# Patient Record
Sex: Male | Born: 1945
Health system: Southern US, Community
[De-identification: ages and names within clinical notes are randomized; demographics above are authoritative.]

## PROBLEM LIST (undated history)

## (undated) DIAGNOSIS — Z8051 Family history of malignant neoplasm of kidney: Secondary | ICD-10-CM

## (undated) DIAGNOSIS — M1990 Unspecified osteoarthritis, unspecified site: Secondary | ICD-10-CM

## (undated) DIAGNOSIS — N3281 Overactive bladder: Secondary | ICD-10-CM

## (undated) DIAGNOSIS — G629 Polyneuropathy, unspecified: Secondary | ICD-10-CM

## (undated) DIAGNOSIS — I2510 Atherosclerotic heart disease of native coronary artery without angina pectoris: Secondary | ICD-10-CM

## (undated) DIAGNOSIS — N183 Chronic kidney disease, stage 3 unspecified: Secondary | ICD-10-CM

## (undated) DIAGNOSIS — Z8709 Personal history of other diseases of the respiratory system: Secondary | ICD-10-CM

## (undated) DIAGNOSIS — Z8781 Personal history of (healed) traumatic fracture: Secondary | ICD-10-CM

## (undated) DIAGNOSIS — Z8719 Personal history of other diseases of the digestive system: Secondary | ICD-10-CM

## (undated) DIAGNOSIS — I440 Atrioventricular block, first degree: Secondary | ICD-10-CM

## (undated) DIAGNOSIS — I10 Essential (primary) hypertension: Secondary | ICD-10-CM

## (undated) DIAGNOSIS — Z86010 Personal history of colonic polyps: Secondary | ICD-10-CM

## (undated) DIAGNOSIS — N99820 Postprocedural hemorrhage and hematoma of a genitourinary system organ or structure following a genitourinary system procedure: Secondary | ICD-10-CM

## (undated) DIAGNOSIS — S42293A Other displaced fracture of upper end of unspecified humerus, initial encounter for closed fracture: Secondary | ICD-10-CM

## (undated) DIAGNOSIS — Z923 Personal history of irradiation: Secondary | ICD-10-CM

## (undated) DIAGNOSIS — G2581 Restless legs syndrome: Secondary | ICD-10-CM

## (undated) DIAGNOSIS — R350 Frequency of micturition: Secondary | ICD-10-CM

## (undated) DIAGNOSIS — Z87820 Personal history of traumatic brain injury: Secondary | ICD-10-CM

## (undated) DIAGNOSIS — Z87442 Personal history of urinary calculi: Secondary | ICD-10-CM

## (undated) DIAGNOSIS — K219 Gastro-esophageal reflux disease without esophagitis: Secondary | ICD-10-CM

## (undated) DIAGNOSIS — J439 Emphysema, unspecified: Secondary | ICD-10-CM

## (undated) DIAGNOSIS — N3941 Urge incontinence: Secondary | ICD-10-CM

## (undated) DIAGNOSIS — C61 Malignant neoplasm of prostate: Secondary | ICD-10-CM

## (undated) DIAGNOSIS — N529 Male erectile dysfunction, unspecified: Secondary | ICD-10-CM

## (undated) DIAGNOSIS — R011 Cardiac murmur, unspecified: Secondary | ICD-10-CM

## (undated) DIAGNOSIS — M109 Gout, unspecified: Secondary | ICD-10-CM

## (undated) DIAGNOSIS — G4733 Obstructive sleep apnea (adult) (pediatric): Secondary | ICD-10-CM

## (undated) DIAGNOSIS — Z860101 Personal history of adenomatous and serrated colon polyps: Secondary | ICD-10-CM

## (undated) DIAGNOSIS — I700 Atherosclerosis of aorta: Secondary | ICD-10-CM

## (undated) HISTORY — PX: CARDIOVASCULAR STRESS TEST: SHX262

## (undated) HISTORY — DX: Family history of malignant neoplasm of kidney: Z80.51

## (undated) HISTORY — PX: UMBILICAL HERNIA REPAIR: SHX196

## (undated) HISTORY — PX: EXTRACORPOREAL SHOCK WAVE LITHOTRIPSY: SHX1557

## (undated) HISTORY — DX: Malignant neoplasm of prostate: C61

## (undated) HISTORY — DX: Atherosclerotic heart disease of native coronary artery without angina pectoris: I25.10

## (undated) HISTORY — DX: Essential (primary) hypertension: I10

## (undated) HISTORY — PX: ABDOMINAL AORTIC ENDOVASCULAR STENT GRAFT: SHX5707

## (undated) HISTORY — PX: TRANSTHORACIC ECHOCARDIOGRAM: SHX275

## (undated) HISTORY — DX: Atherosclerosis of aorta: I70.0

## (undated) HISTORY — PX: CARDIAC CATHETERIZATION: SHX172

## (undated) HISTORY — PX: COLONOSCOPY W/ POLYPECTOMY: SHX1380

## (undated) HISTORY — PX: FRACTURE SURGERY: SHX138

---

## 1987-04-04 HISTORY — PX: CHEST TUBE INSERTION: SHX231

## 2003-04-04 DIAGNOSIS — Z87820 Personal history of traumatic brain injury: Secondary | ICD-10-CM

## 2003-04-04 HISTORY — DX: Personal history of traumatic brain injury: Z87.820

## 2003-04-04 HISTORY — PX: CARDIAC CATHETERIZATION: SHX172

## 2005-07-11 ENCOUNTER — Ambulatory Visit: Payer: Self-pay | Admitting: Family Medicine

## 2005-07-25 ENCOUNTER — Ambulatory Visit: Payer: Self-pay | Admitting: Family Medicine

## 2005-07-31 ENCOUNTER — Ambulatory Visit: Payer: Self-pay | Admitting: Family Medicine

## 2005-08-22 ENCOUNTER — Ambulatory Visit: Payer: Self-pay | Admitting: Family Medicine

## 2005-09-05 ENCOUNTER — Ambulatory Visit: Payer: Self-pay | Admitting: Family Medicine

## 2005-10-17 ENCOUNTER — Encounter (INDEPENDENT_AMBULATORY_CARE_PROVIDER_SITE_OTHER): Payer: Self-pay | Admitting: Family Medicine

## 2005-10-19 ENCOUNTER — Ambulatory Visit: Payer: Self-pay | Admitting: Family Medicine

## 2005-10-30 ENCOUNTER — Encounter (INDEPENDENT_AMBULATORY_CARE_PROVIDER_SITE_OTHER): Payer: Self-pay | Admitting: Family Medicine

## 2005-11-03 ENCOUNTER — Ambulatory Visit: Payer: Self-pay | Admitting: Family Medicine

## 2005-11-05 ENCOUNTER — Emergency Department (HOSPITAL_COMMUNITY): Admission: EM | Admit: 2005-11-05 | Discharge: 2005-11-05 | Payer: Self-pay | Admitting: Emergency Medicine

## 2005-11-10 ENCOUNTER — Ambulatory Visit: Payer: Self-pay | Admitting: Family Medicine

## 2006-01-08 ENCOUNTER — Encounter (INDEPENDENT_AMBULATORY_CARE_PROVIDER_SITE_OTHER): Payer: Self-pay | Admitting: Family Medicine

## 2006-06-06 ENCOUNTER — Encounter (INDEPENDENT_AMBULATORY_CARE_PROVIDER_SITE_OTHER): Payer: Self-pay | Admitting: Family Medicine

## 2006-09-07 LAB — CONVERTED CEMR LAB: PSA: 1.6 ng/mL

## 2007-01-31 ENCOUNTER — Ambulatory Visit: Payer: Self-pay | Admitting: Family Medicine

## 2007-01-31 DIAGNOSIS — M549 Dorsalgia, unspecified: Secondary | ICD-10-CM | POA: Insufficient documentation

## 2007-01-31 DIAGNOSIS — F17200 Nicotine dependence, unspecified, uncomplicated: Secondary | ICD-10-CM | POA: Insufficient documentation

## 2007-01-31 DIAGNOSIS — F411 Generalized anxiety disorder: Secondary | ICD-10-CM | POA: Insufficient documentation

## 2007-01-31 DIAGNOSIS — F4310 Post-traumatic stress disorder, unspecified: Secondary | ICD-10-CM | POA: Insufficient documentation

## 2007-01-31 DIAGNOSIS — M109 Gout, unspecified: Secondary | ICD-10-CM | POA: Insufficient documentation

## 2007-01-31 DIAGNOSIS — I10 Essential (primary) hypertension: Secondary | ICD-10-CM | POA: Insufficient documentation

## 2007-01-31 DIAGNOSIS — Z87442 Personal history of urinary calculi: Secondary | ICD-10-CM | POA: Insufficient documentation

## 2007-01-31 DIAGNOSIS — K279 Peptic ulcer, site unspecified, unspecified as acute or chronic, without hemorrhage or perforation: Secondary | ICD-10-CM | POA: Insufficient documentation

## 2007-01-31 DIAGNOSIS — E669 Obesity, unspecified: Secondary | ICD-10-CM | POA: Insufficient documentation

## 2007-01-31 DIAGNOSIS — E785 Hyperlipidemia, unspecified: Secondary | ICD-10-CM | POA: Insufficient documentation

## 2007-01-31 LAB — CONVERTED CEMR LAB
Cholesterol, target level: 200 mg/dL
HDL goal, serum: 40 mg/dL
LDL Goal: 100 mg/dL

## 2007-02-12 ENCOUNTER — Encounter (INDEPENDENT_AMBULATORY_CARE_PROVIDER_SITE_OTHER): Payer: Self-pay | Admitting: Internal Medicine

## 2007-02-21 ENCOUNTER — Ambulatory Visit: Payer: Self-pay | Admitting: Family Medicine

## 2007-02-21 DIAGNOSIS — M722 Plantar fascial fibromatosis: Secondary | ICD-10-CM | POA: Insufficient documentation

## 2007-02-22 ENCOUNTER — Encounter (INDEPENDENT_AMBULATORY_CARE_PROVIDER_SITE_OTHER): Payer: Self-pay | Admitting: Family Medicine

## 2007-03-22 ENCOUNTER — Telehealth (INDEPENDENT_AMBULATORY_CARE_PROVIDER_SITE_OTHER): Payer: Self-pay | Admitting: *Deleted

## 2007-03-25 ENCOUNTER — Encounter (INDEPENDENT_AMBULATORY_CARE_PROVIDER_SITE_OTHER): Payer: Self-pay | Admitting: Family Medicine

## 2007-03-26 ENCOUNTER — Encounter (INDEPENDENT_AMBULATORY_CARE_PROVIDER_SITE_OTHER): Payer: Self-pay | Admitting: Family Medicine

## 2007-05-13 ENCOUNTER — Encounter (INDEPENDENT_AMBULATORY_CARE_PROVIDER_SITE_OTHER): Payer: Self-pay | Admitting: Family Medicine

## 2007-07-11 ENCOUNTER — Telehealth (INDEPENDENT_AMBULATORY_CARE_PROVIDER_SITE_OTHER): Payer: Self-pay | Admitting: Family Medicine

## 2007-07-16 ENCOUNTER — Ambulatory Visit: Payer: Self-pay | Admitting: Family Medicine

## 2007-07-17 ENCOUNTER — Telehealth (INDEPENDENT_AMBULATORY_CARE_PROVIDER_SITE_OTHER): Payer: Self-pay | Admitting: *Deleted

## 2007-07-17 ENCOUNTER — Encounter (INDEPENDENT_AMBULATORY_CARE_PROVIDER_SITE_OTHER): Payer: Self-pay | Admitting: Family Medicine

## 2007-07-18 ENCOUNTER — Telehealth (INDEPENDENT_AMBULATORY_CARE_PROVIDER_SITE_OTHER): Payer: Self-pay | Admitting: *Deleted

## 2007-07-18 LAB — CONVERTED CEMR LAB
ALT: 15 units/L (ref 0–53)
AST: 14 units/L (ref 0–37)
Albumin: 4.3 g/dL (ref 3.5–5.2)
Alkaline Phosphatase: 79 units/L (ref 39–117)
BUN: 14 mg/dL (ref 6–23)
Basophils Absolute: 0.1 10*3/uL (ref 0.0–0.1)
Basophils Relative: 1 % (ref 0–1)
CO2: 26 meq/L (ref 19–32)
Calcium: 9.1 mg/dL (ref 8.4–10.5)
Chloride: 102 meq/L (ref 96–112)
Cholesterol: 167 mg/dL (ref 0–200)
Creatinine, Ser: 1.25 mg/dL (ref 0.40–1.50)
Eosinophils Absolute: 0.4 10*3/uL (ref 0.0–0.7)
Eosinophils Relative: 6 % — ABNORMAL HIGH (ref 0–5)
Glucose, Bld: 92 mg/dL (ref 70–99)
HCT: 45.9 % (ref 39.0–52.0)
HDL: 38 mg/dL — ABNORMAL LOW (ref >39)
Hemoglobin: 15.2 g/dL (ref 13.0–17.0)
LDL Cholesterol: 90 mg/dL (ref 0–99)
Lymphocytes Relative: 19 % (ref 12–46)
Lymphs Abs: 1.2 10*3/uL (ref 0.7–4.0)
MCHC: 33.1 g/dL (ref 30.0–36.0)
MCV: 88.3 fL (ref 78.0–100.0)
Monocytes Absolute: 0.4 10*3/uL (ref 0.1–1.0)
Monocytes Relative: 6 % (ref 3–12)
Neutro Abs: 4.4 10*3/uL (ref 1.7–7.7)
Neutrophils Relative %: 69 % (ref 43–77)
Platelets: 279 10*3/uL (ref 150–400)
Potassium: 3.9 meq/L (ref 3.5–5.3)
RBC: 5.2 M/uL (ref 4.22–5.81)
RDW: 14.1 % (ref 11.5–15.5)
Sodium: 144 meq/L (ref 135–145)
TSH: 1.608 microintl units/mL (ref 0.350–5.50)
Total Bilirubin: 0.5 mg/dL (ref 0.3–1.2)
Total CHOL/HDL Ratio: 4.4
Total Protein: 7.1 g/dL (ref 6.0–8.3)
Triglycerides: 196 mg/dL — ABNORMAL HIGH (ref <150)
Uric Acid, Serum: 7.2 mg/dL (ref 4.0–7.8)
VLDL: 39 mg/dL (ref 0–40)
WBC: 6.4 10*3/uL (ref 4.0–10.5)

## 2007-07-22 ENCOUNTER — Ambulatory Visit (HOSPITAL_COMMUNITY): Admission: RE | Admit: 2007-07-22 | Discharge: 2007-07-22 | Payer: Self-pay | Admitting: Family Medicine

## 2007-07-23 ENCOUNTER — Telehealth (INDEPENDENT_AMBULATORY_CARE_PROVIDER_SITE_OTHER): Payer: Self-pay | Admitting: *Deleted

## 2007-07-24 ENCOUNTER — Telehealth (INDEPENDENT_AMBULATORY_CARE_PROVIDER_SITE_OTHER): Payer: Self-pay | Admitting: *Deleted

## 2007-07-24 ENCOUNTER — Telehealth (INDEPENDENT_AMBULATORY_CARE_PROVIDER_SITE_OTHER): Payer: Self-pay | Admitting: Family Medicine

## 2007-07-30 ENCOUNTER — Ambulatory Visit: Payer: Self-pay | Admitting: Family Medicine

## 2007-07-30 DIAGNOSIS — R809 Proteinuria, unspecified: Secondary | ICD-10-CM | POA: Insufficient documentation

## 2007-07-30 LAB — CONVERTED CEMR LAB
Bilirubin Urine: NEGATIVE
Glucose, Urine, Semiquant: NEGATIVE
Ketones, urine, test strip: NEGATIVE
Nitrite: NEGATIVE
Protein, U semiquant: >=300
Specific Gravity, Urine: 1.025
Urobilinogen, UA: 0.2
WBC Urine, dipstick: NEGATIVE
pH: 6

## 2007-08-08 ENCOUNTER — Encounter (INDEPENDENT_AMBULATORY_CARE_PROVIDER_SITE_OTHER): Payer: Self-pay | Admitting: Family Medicine

## 2007-08-13 ENCOUNTER — Ambulatory Visit: Payer: Self-pay | Admitting: Family Medicine

## 2007-08-13 ENCOUNTER — Telehealth (INDEPENDENT_AMBULATORY_CARE_PROVIDER_SITE_OTHER): Payer: Self-pay | Admitting: *Deleted

## 2007-08-14 ENCOUNTER — Encounter (INDEPENDENT_AMBULATORY_CARE_PROVIDER_SITE_OTHER): Payer: Self-pay | Admitting: Family Medicine

## 2007-08-15 ENCOUNTER — Telehealth (INDEPENDENT_AMBULATORY_CARE_PROVIDER_SITE_OTHER): Payer: Self-pay | Admitting: *Deleted

## 2007-08-15 ENCOUNTER — Ambulatory Visit: Payer: Self-pay | Admitting: *Deleted

## 2007-08-15 LAB — CONVERTED CEMR LAB
Catecholamines Tot(E+NE) 24 Hr U: 0.046 mg/24hr
Collection Interval-CRCL: 24 hr
Creatinine 24 HR UR: 1759 mg/24hr (ref 800–2000)
Creatinine Clearance: 98 mL/min (ref 75–125)
Creatinine, Urine: 117.3 mg/dL
Dopamine 24 Hr Urine: 208 mcg/24hr (ref <500)
Epinephrine 24 Hr Urine: 2 mcg/24hr (ref <20)
Metaneph Total, Ur: 426 ug/24hr (ref 224–832)
Metanephrines, Ur: 80 — ABNORMAL LOW (ref 90–315)
Norepinephrine 24 Hr Urine: 45 mcg/24hr (ref <80)
Normetanephrine, 24H Ur: 346 (ref 122–676)
Protein, Ur: 900 mg/24hr — ABNORMAL HIGH (ref 50–100)
VMA, 24H Ur Adult: 5.8 mg/24hr (ref 1.8–6.7)
Volume, Urine-CORTUR: 1500 mL

## 2007-08-21 ENCOUNTER — Ambulatory Visit (HOSPITAL_COMMUNITY): Payer: Self-pay | Admitting: Psychology

## 2007-09-10 ENCOUNTER — Ambulatory Visit: Payer: Self-pay | Admitting: Family Medicine

## 2007-09-13 ENCOUNTER — Ambulatory Visit: Payer: Self-pay | Admitting: *Deleted

## 2007-09-13 ENCOUNTER — Inpatient Hospital Stay (HOSPITAL_COMMUNITY): Admission: RE | Admit: 2007-09-13 | Discharge: 2007-09-14 | Payer: Self-pay | Admitting: *Deleted

## 2007-09-23 ENCOUNTER — Ambulatory Visit: Payer: Self-pay | Admitting: *Deleted

## 2007-10-10 ENCOUNTER — Ambulatory Visit: Payer: Self-pay | Admitting: *Deleted

## 2007-10-10 ENCOUNTER — Encounter: Admission: RE | Admit: 2007-10-10 | Discharge: 2007-10-10 | Payer: Self-pay | Admitting: *Deleted

## 2007-10-22 ENCOUNTER — Ambulatory Visit: Payer: Self-pay | Admitting: Family Medicine

## 2007-10-29 ENCOUNTER — Ambulatory Visit: Payer: Self-pay | Admitting: Family Medicine

## 2007-10-30 ENCOUNTER — Encounter (INDEPENDENT_AMBULATORY_CARE_PROVIDER_SITE_OTHER): Payer: Self-pay | Admitting: Family Medicine

## 2007-10-30 LAB — CONVERTED CEMR LAB
Folate: 5 ng/mL
Vitamin B-12: 268 pg/mL (ref 211–911)

## 2007-12-02 ENCOUNTER — Ambulatory Visit: Payer: Self-pay | Admitting: Family Medicine

## 2007-12-02 LAB — CONVERTED CEMR LAB: LDL Goal: 130 mg/dL

## 2007-12-13 ENCOUNTER — Telehealth (INDEPENDENT_AMBULATORY_CARE_PROVIDER_SITE_OTHER): Payer: Self-pay | Admitting: *Deleted

## 2007-12-18 ENCOUNTER — Encounter (INDEPENDENT_AMBULATORY_CARE_PROVIDER_SITE_OTHER): Payer: Self-pay | Admitting: Family Medicine

## 2007-12-23 ENCOUNTER — Encounter (INDEPENDENT_AMBULATORY_CARE_PROVIDER_SITE_OTHER): Payer: Self-pay | Admitting: Family Medicine

## 2008-01-20 ENCOUNTER — Ambulatory Visit: Payer: Self-pay | Admitting: Family Medicine

## 2008-01-20 DIAGNOSIS — E538 Deficiency of other specified B group vitamins: Secondary | ICD-10-CM | POA: Insufficient documentation

## 2008-01-20 DIAGNOSIS — E559 Vitamin D deficiency, unspecified: Secondary | ICD-10-CM | POA: Insufficient documentation

## 2008-02-20 ENCOUNTER — Ambulatory Visit: Payer: Self-pay | Admitting: Family Medicine

## 2008-02-20 DIAGNOSIS — N401 Enlarged prostate with lower urinary tract symptoms: Secondary | ICD-10-CM | POA: Insufficient documentation

## 2008-02-20 LAB — CONVERTED CEMR LAB
Bilirubin Urine: NEGATIVE
Blood in Urine, dipstick: NEGATIVE
Glucose, Urine, Semiquant: NEGATIVE
Ketones, urine, test strip: NEGATIVE
Nitrite: NEGATIVE
Protein, U semiquant: >=300
Specific Gravity, Urine: 1.025
Urobilinogen, UA: 0.2
WBC Urine, dipstick: NEGATIVE
pH: 6

## 2008-02-24 ENCOUNTER — Encounter (INDEPENDENT_AMBULATORY_CARE_PROVIDER_SITE_OTHER): Payer: Self-pay | Admitting: Family Medicine

## 2008-02-25 LAB — CONVERTED CEMR LAB
ALT: 16 units/L (ref 0–53)
AST: 13 units/L (ref 0–37)
Albumin: 4.3 g/dL (ref 3.5–5.2)
Alkaline Phosphatase: 68 units/L (ref 39–117)
BUN: 19 mg/dL (ref 6–23)
CO2: 21 meq/L (ref 19–32)
Calcium: 8.5 mg/dL (ref 8.4–10.5)
Chloride: 106 meq/L (ref 96–112)
Cholesterol: 176 mg/dL (ref 0–200)
Creatinine, Ser: 1.43 mg/dL (ref 0.40–1.50)
Glucose, Bld: 108 mg/dL — ABNORMAL HIGH (ref 70–99)
HDL: 40 mg/dL (ref >39)
LDL Cholesterol: 108 mg/dL — ABNORMAL HIGH (ref 0–99)
PSA: 2.16 ng/mL (ref 0.10–4.00)
Potassium: 3.6 meq/L (ref 3.5–5.3)
Sodium: 143 meq/L (ref 135–145)
Total Bilirubin: 0.7 mg/dL (ref 0.3–1.2)
Total CHOL/HDL Ratio: 4.4
Total Protein: 6.5 g/dL (ref 6.0–8.3)
Triglycerides: 138 mg/dL (ref <150)
VLDL: 28 mg/dL (ref 0–40)

## 2008-03-19 ENCOUNTER — Ambulatory Visit: Payer: Self-pay | Admitting: Family Medicine

## 2008-03-19 LAB — CONVERTED CEMR LAB: LDL Goal: 100 mg/dL

## 2008-03-23 ENCOUNTER — Encounter (INDEPENDENT_AMBULATORY_CARE_PROVIDER_SITE_OTHER): Payer: Self-pay | Admitting: Family Medicine

## 2008-04-27 ENCOUNTER — Ambulatory Visit: Payer: Self-pay | Admitting: Family Medicine

## 2008-04-29 ENCOUNTER — Encounter (INDEPENDENT_AMBULATORY_CARE_PROVIDER_SITE_OTHER): Payer: Self-pay | Admitting: Family Medicine

## 2008-04-30 ENCOUNTER — Encounter: Admission: RE | Admit: 2008-04-30 | Discharge: 2008-04-30 | Payer: Self-pay | Admitting: *Deleted

## 2008-04-30 ENCOUNTER — Ambulatory Visit: Payer: Self-pay | Admitting: *Deleted

## 2008-06-08 ENCOUNTER — Ambulatory Visit: Payer: Self-pay | Admitting: Family Medicine

## 2008-06-24 ENCOUNTER — Encounter (INDEPENDENT_AMBULATORY_CARE_PROVIDER_SITE_OTHER): Payer: Self-pay | Admitting: Family Medicine

## 2008-06-26 LAB — CONVERTED CEMR LAB
ALT: 13 units/L (ref 0–53)
AST: 13 units/L (ref 0–37)
Albumin: 4.3 g/dL (ref 3.5–5.2)
Alkaline Phosphatase: 70 units/L (ref 39–117)
BUN: 17 mg/dL (ref 6–23)
Basophils Absolute: 0.1 10*3/uL (ref 0.0–0.1)
Basophils Relative: 1 % (ref 0–1)
CO2: 23 meq/L (ref 19–32)
Calcium: 8.8 mg/dL (ref 8.4–10.5)
Chloride: 109 meq/L (ref 96–112)
Cholesterol: 137 mg/dL (ref 0–200)
Creatinine, Ser: 1.35 mg/dL (ref 0.40–1.50)
Eosinophils Absolute: 0.3 10*3/uL (ref 0.0–0.7)
Eosinophils Relative: 5 % (ref 0–5)
Glucose, Bld: 102 mg/dL — ABNORMAL HIGH (ref 70–99)
HCT: 43.3 % (ref 39.0–52.0)
HDL: 38 mg/dL — ABNORMAL LOW (ref >39)
Hemoglobin: 14 g/dL (ref 13.0–17.0)
LDL Cholesterol: 73 mg/dL (ref 0–99)
Lymphocytes Relative: 18 % (ref 12–46)
Lymphs Abs: 1.1 10*3/uL (ref 0.7–4.0)
MCHC: 32.3 g/dL (ref 30.0–36.0)
MCV: 85.7 fL (ref 78.0–100.0)
Monocytes Absolute: 0.4 10*3/uL (ref 0.1–1.0)
Monocytes Relative: 7 % (ref 3–12)
Neutro Abs: 4.2 10*3/uL (ref 1.7–7.7)
Neutrophils Relative %: 69 % (ref 43–77)
Platelets: 232 10*3/uL (ref 150–400)
Potassium: 3.7 meq/L (ref 3.5–5.3)
RBC: 5.05 M/uL (ref 4.22–5.81)
RDW: 14.5 % (ref 11.5–15.5)
Sodium: 145 meq/L (ref 135–145)
TSH: 2.007 microintl units/mL (ref 0.350–4.500)
Total Bilirubin: 0.6 mg/dL (ref 0.3–1.2)
Total CHOL/HDL Ratio: 3.6
Total Protein: 6.8 g/dL (ref 6.0–8.3)
Triglycerides: 128 mg/dL (ref <150)
VLDL: 26 mg/dL (ref 0–40)
Vit D, 25-Hydroxy: 42 ng/mL (ref 30–89)
Vitamin B-12: 342 pg/mL (ref 211–911)
WBC: 6.1 10*3/uL (ref 4.0–10.5)

## 2008-07-01 ENCOUNTER — Ambulatory Visit: Payer: Self-pay | Admitting: Family Medicine

## 2008-07-02 ENCOUNTER — Encounter (INDEPENDENT_AMBULATORY_CARE_PROVIDER_SITE_OTHER): Payer: Self-pay | Admitting: Family Medicine

## 2008-07-02 ENCOUNTER — Encounter (INDEPENDENT_AMBULATORY_CARE_PROVIDER_SITE_OTHER): Payer: Self-pay | Admitting: *Deleted

## 2008-07-30 ENCOUNTER — Ambulatory Visit: Payer: Self-pay | Admitting: Internal Medicine

## 2008-07-30 DIAGNOSIS — Z86010 Personal history of colonic polyps: Secondary | ICD-10-CM | POA: Insufficient documentation

## 2008-07-30 DIAGNOSIS — K219 Gastro-esophageal reflux disease without esophagitis: Secondary | ICD-10-CM | POA: Insufficient documentation

## 2008-08-06 ENCOUNTER — Ambulatory Visit: Payer: Self-pay | Admitting: Family Medicine

## 2008-08-06 DIAGNOSIS — R0609 Other forms of dyspnea: Secondary | ICD-10-CM | POA: Insufficient documentation

## 2008-08-06 DIAGNOSIS — L919 Hypertrophic disorder of the skin, unspecified: Secondary | ICD-10-CM

## 2008-08-06 DIAGNOSIS — L909 Atrophic disorder of skin, unspecified: Secondary | ICD-10-CM | POA: Insufficient documentation

## 2008-08-06 DIAGNOSIS — R0989 Other specified symptoms and signs involving the circulatory and respiratory systems: Secondary | ICD-10-CM | POA: Insufficient documentation

## 2008-08-07 ENCOUNTER — Encounter (INDEPENDENT_AMBULATORY_CARE_PROVIDER_SITE_OTHER): Payer: Self-pay | Admitting: Family Medicine

## 2008-08-12 ENCOUNTER — Encounter: Payer: Self-pay | Admitting: Internal Medicine

## 2008-08-12 ENCOUNTER — Ambulatory Visit: Payer: Self-pay | Admitting: Internal Medicine

## 2008-08-12 ENCOUNTER — Ambulatory Visit (HOSPITAL_COMMUNITY): Admission: RE | Admit: 2008-08-12 | Discharge: 2008-08-12 | Payer: Self-pay | Admitting: Internal Medicine

## 2008-08-16 ENCOUNTER — Encounter: Payer: Self-pay | Admitting: Internal Medicine

## 2008-08-16 ENCOUNTER — Encounter (INDEPENDENT_AMBULATORY_CARE_PROVIDER_SITE_OTHER): Payer: Self-pay | Admitting: Family Medicine

## 2008-08-19 ENCOUNTER — Encounter (INDEPENDENT_AMBULATORY_CARE_PROVIDER_SITE_OTHER): Payer: Self-pay | Admitting: Family Medicine

## 2008-08-21 ENCOUNTER — Encounter (INDEPENDENT_AMBULATORY_CARE_PROVIDER_SITE_OTHER): Payer: Self-pay | Admitting: Family Medicine

## 2008-08-25 ENCOUNTER — Telehealth (INDEPENDENT_AMBULATORY_CARE_PROVIDER_SITE_OTHER): Payer: Self-pay | Admitting: *Deleted

## 2008-09-09 ENCOUNTER — Encounter (INDEPENDENT_AMBULATORY_CARE_PROVIDER_SITE_OTHER): Payer: Self-pay | Admitting: Family Medicine

## 2008-09-28 ENCOUNTER — Ambulatory Visit: Payer: Self-pay | Admitting: Family Medicine

## 2008-10-13 ENCOUNTER — Encounter (INDEPENDENT_AMBULATORY_CARE_PROVIDER_SITE_OTHER): Payer: Self-pay | Admitting: Family Medicine

## 2008-10-14 ENCOUNTER — Encounter (INDEPENDENT_AMBULATORY_CARE_PROVIDER_SITE_OTHER): Payer: Self-pay | Admitting: Family Medicine

## 2008-10-29 ENCOUNTER — Ambulatory Visit: Payer: Self-pay | Admitting: *Deleted

## 2008-12-30 ENCOUNTER — Emergency Department (HOSPITAL_COMMUNITY): Admission: EM | Admit: 2008-12-30 | Discharge: 2008-12-30 | Payer: Self-pay | Admitting: Emergency Medicine

## 2009-06-02 ENCOUNTER — Encounter: Payer: Self-pay | Admitting: Cardiology

## 2009-06-04 ENCOUNTER — Ambulatory Visit (HOSPITAL_COMMUNITY): Admission: RE | Admit: 2009-06-04 | Discharge: 2009-06-04 | Payer: Self-pay | Admitting: Family Medicine

## 2009-07-27 ENCOUNTER — Encounter: Payer: Self-pay | Admitting: Cardiology

## 2009-09-15 ENCOUNTER — Ambulatory Visit: Payer: Self-pay | Admitting: Cardiology

## 2009-09-17 LAB — CONVERTED CEMR LAB: Pro B Natriuretic peptide (BNP): 34.4 pg/mL (ref 0.0–100.0)

## 2009-09-23 ENCOUNTER — Telehealth (INDEPENDENT_AMBULATORY_CARE_PROVIDER_SITE_OTHER): Payer: Self-pay | Admitting: *Deleted

## 2009-09-27 ENCOUNTER — Encounter (HOSPITAL_COMMUNITY): Admission: RE | Admit: 2009-09-27 | Discharge: 2009-12-08 | Payer: Self-pay | Admitting: Cardiology

## 2009-09-27 ENCOUNTER — Encounter: Payer: Self-pay | Admitting: Cardiology

## 2009-09-27 ENCOUNTER — Ambulatory Visit: Payer: Self-pay

## 2009-09-27 ENCOUNTER — Ambulatory Visit: Payer: Self-pay | Admitting: Cardiology

## 2009-09-27 ENCOUNTER — Ambulatory Visit (HOSPITAL_COMMUNITY): Admission: RE | Admit: 2009-09-27 | Discharge: 2009-09-27 | Payer: Self-pay | Admitting: Cardiology

## 2009-09-28 ENCOUNTER — Encounter: Payer: Self-pay | Admitting: Cardiology

## 2009-09-30 ENCOUNTER — Observation Stay (HOSPITAL_COMMUNITY): Admission: EM | Admit: 2009-09-30 | Discharge: 2009-10-01 | Payer: Self-pay | Admitting: Emergency Medicine

## 2009-09-30 ENCOUNTER — Ambulatory Visit: Payer: Self-pay | Admitting: Internal Medicine

## 2009-10-05 ENCOUNTER — Ambulatory Visit (HOSPITAL_COMMUNITY): Admission: RE | Admit: 2009-10-05 | Discharge: 2009-10-07 | Payer: Self-pay | Admitting: Cardiology

## 2009-10-05 ENCOUNTER — Ambulatory Visit: Payer: Self-pay | Admitting: Cardiology

## 2009-10-06 ENCOUNTER — Encounter: Payer: Self-pay | Admitting: Cardiology

## 2009-10-13 ENCOUNTER — Ambulatory Visit: Payer: Self-pay | Admitting: Cardiology

## 2009-10-13 DIAGNOSIS — N182 Chronic kidney disease, stage 2 (mild): Secondary | ICD-10-CM | POA: Insufficient documentation

## 2009-10-14 ENCOUNTER — Telehealth (INDEPENDENT_AMBULATORY_CARE_PROVIDER_SITE_OTHER): Payer: Self-pay | Admitting: *Deleted

## 2009-10-14 LAB — CONVERTED CEMR LAB
BUN: 22 mg/dL (ref 6–23)
CO2: 28 meq/L (ref 19–32)
Calcium: 8.5 mg/dL (ref 8.4–10.5)
Chloride: 103 meq/L (ref 96–112)
Creatinine, Ser: 1.5 mg/dL (ref 0.4–1.5)
GFR calc non Af Amer: 48.48 mL/min (ref >60)
Glucose, Bld: 113 mg/dL — ABNORMAL HIGH (ref 70–99)
Potassium: 3.2 meq/L — ABNORMAL LOW (ref 3.5–5.1)
Sodium: 141 meq/L (ref 135–145)

## 2009-10-26 ENCOUNTER — Ambulatory Visit: Payer: Self-pay | Admitting: Cardiology

## 2009-10-26 DIAGNOSIS — R079 Chest pain, unspecified: Secondary | ICD-10-CM | POA: Insufficient documentation

## 2009-10-27 LAB — CONVERTED CEMR LAB
BUN: 22 mg/dL (ref 6–23)
CO2: 28 meq/L (ref 19–32)
Calcium: 8.7 mg/dL (ref 8.4–10.5)
Chloride: 103 meq/L (ref 96–112)
Creatinine, Ser: 1.5 mg/dL (ref 0.4–1.5)
GFR calc non Af Amer: 50.35 mL/min (ref >60)
Glucose, Bld: 78 mg/dL (ref 70–99)
Potassium: 3.1 meq/L — ABNORMAL LOW (ref 3.5–5.1)
Sodium: 140 meq/L (ref 135–145)

## 2009-11-10 ENCOUNTER — Telehealth: Payer: Self-pay | Admitting: Cardiology

## 2009-11-16 ENCOUNTER — Emergency Department (HOSPITAL_COMMUNITY): Admission: EM | Admit: 2009-11-16 | Discharge: 2009-11-16 | Payer: Self-pay | Admitting: Emergency Medicine

## 2009-11-18 ENCOUNTER — Ambulatory Visit: Payer: Self-pay

## 2009-11-18 ENCOUNTER — Ambulatory Visit: Payer: Self-pay | Admitting: Cardiology

## 2009-11-18 LAB — CONVERTED CEMR LAB
BUN: 28 mg/dL — ABNORMAL HIGH (ref 6–23)
CO2: 26 meq/L (ref 19–32)
Calcium: 8.6 mg/dL (ref 8.4–10.5)
Chloride: 107 meq/L (ref 96–112)
Creatinine, Ser: 1.7 mg/dL — ABNORMAL HIGH (ref 0.4–1.5)
GFR calc non Af Amer: 42.94 mL/min (ref >60)
Glucose, Bld: 96 mg/dL (ref 70–99)
Potassium: 3 meq/L — ABNORMAL LOW (ref 3.5–5.1)
Sodium: 143 meq/L (ref 135–145)

## 2009-11-25 ENCOUNTER — Ambulatory Visit: Payer: Self-pay | Admitting: Cardiology

## 2009-11-25 DIAGNOSIS — E876 Hypokalemia: Secondary | ICD-10-CM | POA: Insufficient documentation

## 2009-11-29 LAB — CONVERTED CEMR LAB
ALT: 25 units/L (ref 0–53)
AST: 20 units/L (ref 0–37)
Albumin: 3.9 g/dL (ref 3.5–5.2)
Alkaline Phosphatase: 54 units/L (ref 39–117)
Bilirubin, Direct: 0.1 mg/dL (ref 0.0–0.3)
Cholesterol: 187 mg/dL (ref 0–200)
Direct LDL: 132.2 mg/dL
HDL: 34.1 mg/dL — ABNORMAL LOW (ref >39.00)
Total Bilirubin: 0.5 mg/dL (ref 0.3–1.2)
Total CHOL/HDL Ratio: 5
Total Protein: 6.6 g/dL (ref 6.0–8.3)
Triglycerides: 202 mg/dL — ABNORMAL HIGH (ref 0.0–149.0)
VLDL: 40.4 mg/dL — ABNORMAL HIGH (ref 0.0–40.0)

## 2010-01-07 ENCOUNTER — Telehealth: Payer: Self-pay | Admitting: Cardiology

## 2010-01-11 ENCOUNTER — Ambulatory Visit: Payer: Self-pay

## 2010-01-24 ENCOUNTER — Ambulatory Visit (HOSPITAL_COMMUNITY): Admission: RE | Admit: 2010-01-24 | Discharge: 2010-01-24 | Payer: Self-pay | Admitting: Family Medicine

## 2010-02-17 ENCOUNTER — Encounter: Payer: Self-pay | Admitting: Cardiology

## 2010-02-21 ENCOUNTER — Ambulatory Visit: Payer: Self-pay | Admitting: Surgery

## 2010-02-28 ENCOUNTER — Ambulatory Visit: Payer: Self-pay | Admitting: Surgery

## 2010-02-28 ENCOUNTER — Encounter: Admission: RE | Admit: 2010-02-28 | Discharge: 2010-02-28 | Payer: Self-pay | Admitting: Surgery

## 2010-03-14 ENCOUNTER — Ambulatory Visit: Payer: Self-pay

## 2010-03-21 ENCOUNTER — Emergency Department (HOSPITAL_COMMUNITY)
Admission: EM | Admit: 2010-03-21 | Discharge: 2010-03-21 | Payer: Self-pay | Source: Home / Self Care | Admitting: Emergency Medicine

## 2010-03-23 ENCOUNTER — Encounter: Payer: Self-pay | Admitting: Cardiology

## 2010-03-23 ENCOUNTER — Ambulatory Visit: Payer: Self-pay | Admitting: Cardiology

## 2010-03-23 DIAGNOSIS — I2510 Atherosclerotic heart disease of native coronary artery without angina pectoris: Secondary | ICD-10-CM | POA: Insufficient documentation

## 2010-04-06 ENCOUNTER — Ambulatory Visit: Admit: 2010-04-06 | Payer: Self-pay | Admitting: Cardiology

## 2010-04-23 ENCOUNTER — Encounter: Payer: Self-pay | Admitting: Family Medicine

## 2010-04-24 ENCOUNTER — Encounter: Payer: Self-pay | Admitting: Vascular Surgery

## 2010-04-24 ENCOUNTER — Encounter: Payer: Self-pay | Admitting: Family Medicine

## 2010-04-25 ENCOUNTER — Ambulatory Visit: Admit: 2010-04-25 | Payer: Self-pay | Admitting: Cardiology

## 2010-05-03 NOTE — Progress Notes (Signed)
Summary: B/P readings  Phone Note Outgoing Call   Call placed by: Desiree Lucy, RN, BSN,  November 10, 2009 8:52 AM Call placed to: Patient Summary of Call: B/P readings  Follow-up for Phone Call        10/26/09--Coreg increased to 25mg  twice a day--call and get B/P readings--LMTCB Desiree Lucy, RN, BSN  November 10, 2009 11:07 AM ---[facility] Desiree Lucy, RN, BSN  November 12, 2009 3:24 PM  [place], RN, BSN  November 15, 2009 9:12 AM   [place], RN, BSN  November 15, 2009 5:50 PM --Andrews, RN, BSN  November 17, 2009 5:28 PM   Additional Follow-up for Phone Call Additional follow up Details #1::        Called patient concerning his bmet from 8/18 and he mentioned that his BP is doing better after medication change as above. He states his last BP reading was 141/94. He had a renal ultrasound today and has a follow up appointment with Dr.McLean on 8/25. He wanted to let Desiree Lucy know that he tried to call her back but did not get thru on the phone. Additional Follow-up by: Georgetta Haber RN

## 2010-05-03 NOTE — Letter (Signed)
Summary: Urology Referral  Urology Referral   Imported By: Rennie Plowman 03/23/2008 14:08:36  _____________________________________________________________________  External Attachment:    Type:   Image     Comment:   External Document

## 2010-05-03 NOTE — Progress Notes (Signed)
Summary: 07/17/07 lab reults  Phone Note Outgoing Call   Call placed by: Druscilla Brownie,  July 18, 2007 11:19 AM Summary of Call: results given to patient and Creat called to [facility] radiology  Initial call taken by: Druscilla Brownie,  July 18, 2007 11:20 AM

## 2010-05-03 NOTE — Assessment & Plan Note (Signed)
Summary: FOLLOW UP 1 MONTH/SLJ   Vital Signs:  Patient profile:   65 year old male Height:      68 inches Weight:      261 pounds BMI:     39.83 O2 Sat:      96 % Pulse rate:   87 / minute Resp:     16 per minute BP sitting:   153 / 104  Vitals Entered By: Deidre Ala LPN (May  6, 624THL D34-534 PM)  Nutrition Counseling: Patient's BMI is greater than 25 and therefore counseled on weight management options. CC: follow-up visit, Hypertension Management, Lipid Management   Primary Provider:  Jonna Munro  CC:  follow-up visit, Hypertension Management, and Lipid Management.  History of Present Illness: Pt in for recheck.  He saw [facility] and is set for colonoscopy next week. He has a hx of polyps and is not clear which kind. His last scope was years ago and due. He knows he was told to get repeat in 5 years. He has a good apetite. Denies nausea and vomitting. No constipation or diarrhea. No bloody stools. States Dr. Gala Romney will do scope. He is scared of scope as was awake last visit and felt a lot of pain.  He states he cont to have high BP here. He does home readinbgs and states 137/83. He uses elctronic device. Pressure at GI was 142/98. Hates coming to MD. He states he has not had chest pain, cont with baseline SOB. He denies orthopnea, PND and palpitations. Declines cardiac and pulmo rehab until done with scope. He has had stress test at [facility] dating back to 2004 - told at time had enlarged heart. No blockage though - had heart cath. He does smoke and  and has dx of COPD. Used Ssiriva and it helped a lot.  Chart review shows in-office spirometry with mild defect and FEV1 of 78. States would love to resume.   He does have leg pain and had ABIs after Aneurysmal repair in June 2009. He was foind to have Vit D def and B12 def per Neuro and levels have normalized. States legs still hurt and wife and he convinced either RLS or PAD. Reviewed ABI result and discussed symptoms of RLS.  He states does cramp in legs but burns in feet. Saw Dr. Brandon Melnick in [place] for eval of neruopathy. Calls him a "quack". See report in EMR.  States leg crampy at night and during day. Wants to try trial of Requip or Quinine. Will agree to see different neurologist for leg burning and cramping if persist despite Rx.  He would also like to see Dermatology - skin tags and dry spots on forehead. Denies bleeding adn enlragement but some ithcing. States wants referal after he completes GI work-up.  He now presents.    Hypertension History:      He complains of dyspnea with exertion, but denies headache, chest pain, palpitations, orthopnea, PND, peripheral edema, visual symptoms, neurologic problems, syncope, and side effects from treatment.  He notes no problems with any antihypertensive medication side effects.  Further comments include: See HPI.        Positive major cardiovascular risk factors include male age 77 years old or older, hyperlipidemia, hypertension, and current tobacco user.  Negative major cardiovascular risk factors include no history of diabetes and negative family history for ischemic heart disease.        Further assessment for target organ damage reveals no history of ASHD, stroke/TIA, or peripheral vascular  disease.    Lipid Management History:      Positive NCEP/ATP III risk factors include male age 63 years old or older, HDL cholesterol less than 40, current tobacco user, and hypertension.  Negative NCEP/ATP III risk factors include non-diabetic, no family history for ischemic heart disease, no ASHD (atherosclerotic heart disease), no prior stroke/TIA, no peripheral vascular disease, and no history of aortic aneurysm.        The patient states that he knows about the "Therapeutic Lifestyle Change" diet.  His compliance with the TLC diet is fair.  The patient expresses understanding of adjunctive measures for cholesterol lowering.  Adjunctive measures started by the patient include  aerobic exercise, fiber, and ASA.  He expresses no side effects from his lipid-lowering medication.  The patient denies any symptoms to suggest myopathy or liver disease.  Comments: Weight down since last eval and states trying to eat healthier and be busier.    Preventive Screening-Counseling & Management     Alcohol drinks/day: 0     Smoking Status: current     Smoking Cessation Counseling: yes     Smoke Cessation Stage: precontemplative     Packs/Day: 0.75     Year Started: Age  6  Comments: States dwon to 3 cigs per day now.  Current Problems (verified): 1)  Gerd  (ICD-530.81) 2)  Colonic Polyps, Hx of  (ICD-V12.72) 3)  Special Screening For Malignant Neoplasms Colon  (ICD-V76.51) 4)  Benign Prostatic Hypertrophy, With Obstruction  (ICD-600.01) 5)  Vitamin B12 Deficiency  (ICD-266.2) 6)  Vitamin D Deficiency  (ICD-268.9) 7)  Neuropathy - Sensory-motor  (ICD-355.9) 8)  Proteinuria  (ICD-791.0) 9)  Plantar Fasciitis, Bilateral  (ICD-728.71) 10)  Nephrolithiasis, Hx of  (ICD-V13.01) 11)  Back Pain  (ICD-724.5) 12)  Obesity Nos  (ICD-278.00) 13)  Sleep Apnea - 4cm H2o  (ICD-780.57) 14)  Tobacco Abuse  (ICD-305.1) 15)  Ptsd  (ICD-309.81) 16)  Pud  (ICD-533.90) 17)  Hypertension  (ICD-401.9) 18)  Hyperlipidemia  (ICD-272.4) 19)  Gout  (ICD-274.9) 20)  Anxiety  (ICD-300.00)  Current Medications (verified): 1)  Adult Aspirin Ec Low Strength 81 Mg  Tbec (Aspirin) .... One Daily 2)  Carvedilol 12.5 Mg Tabs (Carvedilol) .... Two Times A Day 3)  Lexapro 10 Mg  Tabs (Escitalopram Oxalate) .... One Daily 4)  Caduet 10-20 Mg Tabs (Amlodipine-Atorvastatin) .... One Daily 5)  Pantoprazole Sodium 40 Mg  Tbec (Pantoprazole Sodium) .... One Daily 6)  Diovan 320 Mg  Tabs (Valsartan) .... One Daily 7)  Catapres 0.1 Mg  Tabs (Clonidine Hcl) .... Two Times A Day 8)  Labetalol Hcl 200 Mg  Tabs (Labetalol Hcl) .... One Two Times A Day 9)  Vitamin D 50000 Unit Caps (Ergocalciferol) .... One  Weekly 10)  Flomax 0.4 Mg Xr24h-Cap (Tamsulosin Hcl) .... One Daily At Bedtime. 11)  Vit B12 Injections .... One Monthly 12)  Requip 0.5 Mg Tabs (Ropinirole Hcl) .... One At Bedtime For 5 Days and Then Two 13)  Spiriva Handihaler 18 Mcg Caps (Tiotropium Bromide Monohydrate) .... One Inh Daily 14)  Proventil Hfa 108 (90 Base) Mcg/act Aers (Albuterol Sulfate) .... Two Inh Every 6 Hours As Needed  Allergies (verified): 1)  ! Sulf-10 2)  ! Bactrim Ds (Sulfamethoxazole-Trimethoprim)  Past History:  Past Medical History:    Current Problems:     PROTEINURIA (ICD-791.0)    ? of ABDOMINAL AORTIC ANEURYSM (ICD-441.4)    ONYCHOMYCOSIS (ICD-110.1)    PLANTAR FASCIITIS, BILATERAL (ICD-728.71)  NEPHROLITHIASIS, HX OF (ICD-V13.01)    BACK PAIN (ICD-724.5)    OBESITY NOS (ICD-278.00)    SLEEP APNEA - 4CM H2O (ICD-780.57), patient does not tolerate CPAP    TOBACCO ABUSE (ICD-305.1)    PTSD (ICD-309.81)    PUD (ICD-533.90)    HYPERTENSION (ICD-401.9)    HYPERLIPIDEMIA (ICD-272.4)    GOUT (ICD-274.9)    ANXIETY (ICD-300.00)    BENIGN PROSTATIC HYPERTROPHY, WITH OBSTRUCTION (ICD-600.01)    VITAMIN B12 DEFICIENCY (ICD-266.2)    VITAMIN D DEFICIENCY (ICD-268.9)    NEUROPATHY - SENSORY-MOTOR (ICD-355.9)    H/O colonic polyps                     (07/30/2008)  Past Surgical History:    1. Broken arms    2. Hernia repair - umbilical    3. Collapsed lung - right - hx of pushing xray machine and had spontaneous pneuomothorax    4. Kidney stones    5.June 2009 - 1. Closure of penetrating atherosclerotic ulcer infrarenal aorta with          endovascular stent graft (Endologix Powerlink bifurcated stent          graft 25 x 16 x 120, proximal cuff 25 x 25 x 75).     6. 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 College education but never graduate.    Children - 2 sons, 1 dgt (07/30/2008)  Risk Factors:    Alcohol Use: 0 (07/01/2008)    >5 drinks/d w/in last 3 months: N/A    Caffeine Use: N/A    Diet: N/A    Exercise: N/A  Risk Factors:    Smoking Status: current (07/01/2008)    Packs/Day: 0.75 (07/01/2008)    Cigars/wk: N/A    Pipe Use/wk: N/A    Cans of tobacco/wk: N/A    Passive Smoke Exposure: N/A  Review of Systems      See HPI  Physical Exam  General:  Well-developed, obesity ,in no acute distress; alert,appropriate and cooperative throughout examination. Obese. Lungs:  Normal respiratory effort, chest expands symmetrically. Lungs are clear to auscultation, no crackles or wheezes. Heart:  Normal rate and regular rhythm. S1 and S2 normal without gallop, murmur, click, rub or other extra sounds. Abdomen:  Bowel sounds positive,abdomen soft and non-tender without masses, organomegaly or hernias noted. Extremities:  No clubbing, cyanosis, edema, or deformity noted with normal full range of motion of all joints.   Neurologic:  No cranial nerve deficits noted. Station and gait are normal. Plantar reflexes are down-going bilaterally. DTRs are symmetrical throughout. Sensory, motor and coordinative functions appear intact. Skin:  Seborheic lesions to forehad. Multiple skin tags under arms. Cervical Nodes:  No lymphadenopathy noted Psych:  Cognition and judgment appear intact. Alert and cooperative with normal attention span and concentration. No apparent delusions, illusions, hallucinations   Impression &  Recommendations:  Problem # 1:  SPECIAL SCREENING FOR MALIGNANT NEOPLASMS COLON (ICD-V76.51) Discussed. Reassured about procedure. GI note reviewed and aware of waking up during last scope. Discussed differnet types of polyps and cancer risk. Await eval and optomize.  Problem # 2:  NEUROPATHY - SENSORY-MOTOR (ICD-355.9) Discussed. Lyrica did not help his leg at all. His B12 def and Vit D def has corrected. He was given B12 shot today. Advied RLS certainly could be in diff and reassured about ABI as per vascualr last year. Discussed risk and benefit of Requip and agrees to trial. If persit, refer different neurologist.  Problem # 3:  DYSPNEA ON EXERTION (ICD-786.09) Needs cardiac work-up and fulL PFTS. Declines sight need for GI eval. Advied risk and benefit of procedures and well aware. He states will consider if he fails trial of Rx as before i.e. Spiriva and Proventil. Most certainlhy has degree of COPD with years and years of smoking. Await response, recheck 6 weeks and refer as discussed above. His updated medication list for this problem includes:    Carvedilol 12.5 Mg Tabs (Carvedilol) .[name]..[name] Two times a day    Labetalol Hcl 200 Mg Tabs (Labetalol hcl) ..... One two times a day    Spiriva Handihaler 18 Mcg Caps (Tiotropium bromide monohydrate) ..... One inh daily    Proventil Hfa 108 (90 Base) Mcg/act Aers (Albuterol sulfate) .[name]..[name] Two inh every 6 hours as needed  Problem # 4:  TOBACCO ABUSE (ICD-305.1) Again councelled. Not using Rx and cutting back on own. Urged to stay course and congratulated on effort to decrease to 3 cigs today. Stay course. Aware of health risk.  Problem # 5:  OBESITY NOS (ICD-278.00) Happy wityh weight loss. Cont TLC diet, portion control and 30 minutes of exersize daily.  Problem # 6:  HYPERTENSION (ICD-401.9) BP high here. Better at GI and states always good at Dr. Lowanda Foster. See latter as set. Log home BO weekly for 6 weeks. Recheck here at time with machine,  log and verify accuracy. ? white coat His updated medication list for this problem includes:    Carvedilol 12.5 Mg Tabs (Carvedilol) .[name]..[name] Two times a day    Caduet 10-20 Mg Tabs (Amlodipine-atorvastatin) ..... One daily    Diovan 320 Mg Tabs (Valsartan) ..... One daily    Catapres 0.1 Mg Tabs (Clonidine hcl) .[name]..[name] Two times a day    Labetalol Hcl 200 Mg Tabs (Labetalol hcl) ..... One two times a day  Problem # 7:  HYPERLIPIDEMIA (ICD-272.4) Stable. Rx as below.Low fat diet urged. His updated medication list for this problem includes:    Caduet 10-20 Mg Tabs (Amlodipine-atorvastatin) ..... One daily  Problem # 8:  SKIN TAG (ICD-701.9) Refer Derm post GI eval per his request.  Complete Medication List: 1)  Adult Aspirin Ec Low Strength 81 Mg Tbec (Aspirin) .... One daily 2)  Carvedilol 12.5 Mg Tabs (Carvedilol) .... Two times a day 3)  Lexapro 10 Mg Tabs (Escitalopram oxalate) .... One daily 4)  Caduet 10-20 Mg Tabs (Amlodipine-atorvastatin) .... One daily 5)  Pantoprazole Sodium 40 Mg Tbec (Pantoprazole sodium) .... One daily 6)  Diovan 320 Mg Tabs (Valsartan) .... One daily 7)  Catapres 0.1 Mg Tabs (Clonidine hcl) .... Two times a day 8)  Labetalol Hcl 200 Mg Tabs (Labetalol hcl) .... One two times a day 9)  Vitamin  D 50000 Unit Caps (Ergocalciferol) .... One weekly 10)  Flomax 0.4 Mg Xr24h-cap (Tamsulosin hcl) .... One daily at bedtime. 11)  Vit B12 Injections  .... One monthly 12)  Requip 0.5 Mg Tabs (Ropinirole hcl) .... One at bedtime for 5 days and then two 13)  Spiriva Handihaler 18 Mcg Caps (Tiotropium bromide monohydrate) .... One inh daily 14)  Proventil Hfa 108 (90 Base) Mcg/act Aers (Albuterol sulfate) .... Two inh every 6 hours as needed  Other Orders: Vit B12 1000 mcg (J3420) Admin of Therapeutic Inj  intramuscular or subcutaneous [id number])  Hypertension Assessment/Plan:      The patient's hypertensive risk group is category B: At least one risk factor (excluding  diabetes) with no target organ damage.  His calculated 10 year risk of coronary heart disease is 22 %.  Today's blood pressure is 153/104.  His blood pressure goal is < 125/75.  Lipid Assessment/Plan:      Based on NCEP/ATP III, the patient's risk factor category is "2 or more risk factors and a calculated 10 year CAD risk of > 20%".  From this information, the patient's calculated lipid goals are as follows: Total cholesterol goal is 200; LDL cholesterol goal is 100; HDL cholesterol goal is 40; Triglyceride goal is 150.    Patient Instructions: 1)  Please schedule a follow-up appointment in 6 weeks. Prescriptions: PROVENTIL HFA 108 (90 BASE) MCG/ACT AERS (ALBUTEROL SULFATE) Two inh every 6 hours as needed  #1 x 6   Entered and Authorized by:   Weston Settle MD   Signed by:   Weston Settle MD on 08/06/2008   Method used:   Electronically to        [facility] (retail)       [address]       [place], [place]  [address]       Ph: [phone]       Fax: [phone]   RxID:   [id number] SPIRIVA HANDIHALER 18 MCG CAPS (TIOTROPIUM BROMIDE MONOHYDRATE) One inh daily  #1 month x 6   Entered and Authorized by:   Weston Settle MD   Signed by:   Weston Settle MD on 08/06/2008   Method used:   Electronically to        [facility] (retail)       [address]       [place], [place]  [address]       Ph: [phone]       Fax: [phone]   [id number] REQUIP 0.5 MG TABS (ROPINIROLE HCL) One at bedtime for 5 days and then two  #1 month x 3   Entered and Authorized by:   Weston Settle MD   Signed by:   Weston Settle MD on 08/06/2008   Method used:   Electronically to        [facility] (retail)       [address]       [place], [place]  [address]       Ph: [phone]       Fax: [phone]   [id number]      Medication Administration  Injection  # 1:    Medication: Vit B12 1000 mcg    Diagnosis: VITAMIN B12 DEFICIENCY (ICD-266.2)  Route: IM    Site: L deltoid    Exp Date: 08/01/2009    Lot #: [id number]    Mfr: [employer]    Patient tolerated injection without complications    Given by: Deidre Ala LPN (May  6, 624THL QA348G PM)  Orders Added: 1)  Vit B12 1000 mcg [[id number]] 2)  Admin of Therapeutic Inj  intramuscular or subcutaneous [[id number]] 3)  Est. Patient Level IV [id number]

## 2010-05-03 NOTE — Assessment & Plan Note (Signed)
Summary: FOLLOW UP 6 WEEKS/SLJ   Vital Signs:  Patient Profile:   65 Years Old Male Height:     68.75 inches Weight:      248 pounds BMI:     37.02 O2 Sat:      96 % Temp:     97.6 degrees F Pulse rate:   72 / minute Resp:     12 per minute BP sitting:   152 / 92  Vitals Entered By: Deidre Ala (October 22, 2007 10:24 AM)                 PCP:  Weston Settle, MD  Chief Complaint:  follow up visit.  History of Present Illness: Pt in for recheck.  He had a penetrating atherosclrotic ulcer in in his abd aorto. He had this fixed under Dr. [name].  Echart reviewed: Sep 13, 2007 -   1. Closure of penetrating atherosclerotic ulcer infrarenal aorta with       endovascular stent graft (Endologix Powerlink bifurcated stent       graft 25 x 16 x 120, proximal cuff 25 x 25 x 75).   2. Ultrasound guided percutaneous access left common femoral artery.  He states not happy with Dr. [name]. States did not give nough feed back on how surgery went. He denies abdominal pain and states mild tenderness where he had entry into femoral artery. No leg weakness, numbness or tingling and no temp changes.  States had ABI at Dr. [name] as well and circulation was good. Last vist was October 10, 2007 and follow-up was set in 6 months with repeat CT.  He went to [facility]. Saw Dr. Sima Matas. States was watse of time and no help. He is sleeping so-so. Has trouble falling asleep and states nothing on mind. He is not irritable. Focus is fair. Energy is low. He is tired all the times. He has low sex drive and states has good erections. He is on Lexapro and has helped. Has used Ambien in [place] and states no help. He does snore. He does gasp for air. He has CPAP and hx of sleep apnea. Unable to use given mask.  Curious about Lunesta.   He has HTN. Severe. Seeing Dr. Lowanda Foster. Has appt in August. BP trending down. States happy with this. He notes only added new pill - Labetalol 200 mg bid. Was advised may need  spirinolactone. States feels good. Told cont other meds until seen back.  He states feet brun. Toes to mid foot. 4 month hx. Normal ABI again reported. No hx of back pain. States sx only in feet. Has never had NCV.   He now presents.    Hypertension History:      He denies headache, chest pain, palpitations, dyspnea with exertion, orthopnea, PND, peripheral edema, visual symptoms, neurologic problems, syncope, and side effects from treatment.  He notes no problems with any antihypertensive medication side effects.  Further comments include: See HPI.        Positive major cardiovascular risk factors include male age 58 years old or older, hyperlipidemia, hypertension, and current tobacco user.  Negative major cardiovascular risk factors include no history of diabetes and negative family history for ischemic heart disease.        Further assessment for target organ damage reveals no history of ASHD, stroke/TIA, or peripheral vascular disease.       Prior Medications Reviewed Using: Patient Recall  Updated Prior Medication List: ADULT ASPIRIN EC LOW STRENGTH 81 MG  TBEC (ASPIRIN) One daily COREG CR 40 MG  CP24 (CARVEDILOL PHOSPHATE) One daily ALLOPURINOL 100 MG  TABS (ALLOPURINOL) One daily LEXAPRO 10 MG  TABS (ESCITALOPRAM OXALATE) One daily CADUET 10-20 MG  TABS (AMLODIPINE-ATORVASTATIN) One daily PANTOPRAZOLE SODIUM 40 MG  TBEC (PANTOPRAZOLE SODIUM) One daily DIOVAN 320 MG  TABS (VALSARTAN) One daily COLCHICINE 0.6 MG  TABS (COLCHICINE) One two times a day CATAPRES 0.1 MG  TABS (CLONIDINE HCL) two times a day LABETALOL HCL 200 MG  TABS (LABETALOL HCL) One two times a day  Current Allergies (reviewed today): ! SULF-10 (SULFACETAMIDE SODIUM)  Past Surgical History:    Broken arms    Hernia repair - umbilical    Collapsed lung - right - hx of pushing xray machine and had spontaneous pneuomothorax    Kidney stones    JUne 2009 - 1. Closure of penetrating atherosclerotic ulcer  infrarenal aorta with          endovascular stent graft (Endologix Powerlink bifurcated stent          graft 25 x 16 x 120, proximal cuff 25 x 25 x 75).      2. Ultrasound guided percutaneous access left common femoral artery.JUne 12      Physical Exam  General:     Well-developed,well-nourished,in no acute distress; alert,appropriate and cooperative throughout examination. Obese. Lungs:     Normal respiratory effort, chest expands symmetrically. Lungs are clear to auscultation, no crackles or wheezes. Heart:     Normal rate and regular rhythm. S1 and S2 normal without gallop, murmur, click, rub or other extra sounds. Abdomen:     Bowel sounds positive,abdomen soft and non-tender without masses, organomegaly or hernias noted. Extremities:     No clubbing, cyanosis, edema, or deformity noted with normal full range of motion of all joints.  Declines foot exam. States wnats to wait on eval until next visit Cervical Nodes:     No lymphadenopathy noted Psych:     Cognition and judgment appear intact. Alert and cooperative with normal attention span and concentration. No apparent delusions, illusions, hallucinations    Impression & Recommendations:  Problem # 1:  ? of ABDOMINAL AORTIC ANEURYSM (ICD-441.4) S/p repair. Rx as per Dr [name] with agrressive risk factor modification as below. See as scheduled 6 months.  Problem # 2:  SLEEP APNEA - 4CM H2O (ICD-780.57) Refer back to [facility] to optomize mask. Trial Lunesta for insomnia. Recheck 6 weeks. Orders: Misc. Referral (Misc. Ref)   Problem # 3:  HYPERTENSION (ICD-401.9) Improving. Rx optomization with [facility] i.e. Dr. Lowanda Foster. His updated medication list for this problem includes:    Coreg Cr 40 Mg Cp24 (Carvedilol phosphate) ..... One daily    Caduet 10-20 Mg Tabs (Amlodipine-atorvastatin) ..... One daily    Diovan 320 Mg Tabs (Valsartan) ..... One daily    Catapres 0.1 Mg Tabs (Clonidine hcl) .[name]..[name] Two times a day     Labetalol Hcl 200 Mg Tabs (Labetalol hcl) ..... One two times a day   Problem # 4:  HYPERLIPIDEMIA (ICD-272.4) Rx as is. Die tfor three more months tehn repeat. Encouraged fiber, fish oil. His updated medication list for this problem includes:    Caduet 10-20 Mg Tabs (Amlodipine-atorvastatin) ..... One daily   Problem # 5:  OBESITY NOS (ICD-278.00) Again advised diet, exersize and portion control.   Problem # 6:  TOBACCO ABUSE (ICD-305.1) Down to 10 cigs per day. Cont cutting back on own. Declines Rx. Aware of risk and benefit.  Problem #  7:  PLANTAR FASCIITIS, BILATERAL (ICD-728.71) Declines foot exam. Will check nxt visit and optomize. COncier B12/Folate and NCV.  Complete Medication List: 1)  Adult Aspirin Ec Low Strength 81 Mg Tbec (Aspirin) .... One daily 2)  Coreg Cr 40 Mg Cp24 (Carvedilol phosphate) .... One daily 3)  Allopurinol 100 Mg Tabs (Allopurinol) .... One daily 4)  Lexapro 10 Mg Tabs (Escitalopram oxalate) .... One daily 5)  Caduet 10-20 Mg Tabs (Amlodipine-atorvastatin) .... One daily 6)  Pantoprazole Sodium 40 Mg Tbec (Pantoprazole sodium) .... One daily 7)  Diovan 320 Mg Tabs (Valsartan) .... One daily 8)  Colchicine 0.6 Mg Tabs (Colchicine) .... One two times a day 9)  Catapres 0.1 Mg Tabs (Clonidine hcl) .... Two times a day 10)  Labetalol Hcl 200 Mg Tabs (Labetalol hcl) .... One two times a day  Hypertension Assessment/Plan:      The patient's hypertensive risk group is category B: At least one risk factor (excluding diabetes) with no target organ damage.  His calculated 10 year risk of coronary heart disease is 18 %.  Today's blood pressure is 152/92.  His blood pressure goal is < 125/75.   Patient Instructions: 1)  Please schedule a follow-up appointment in 6 weeks.   ]

## 2010-05-03 NOTE — Assessment & Plan Note (Signed)
Summary: Incision draining - work in/arc   Vital Signs:  Patient Profile:   65 Years Old Male Height:     68.75 inches Weight:      248 pounds BMI:     37.02 O2 Sat:      97 % Temp:     97.8 degrees F Pulse rate:   66 / minute Resp:     14 per minute BP sitting:   145 / 88  Vitals Entered By: Deidre Ala (October 29, 2007 10:25 AM)                 PCP:  Weston Settle, MD  Chief Complaint:  complications after surgery.  History of Present Illness: Pt in for acute visit.  He had a pentrating atherosclerotic ulcer fixed September 13, 2007. States did via right groin an wound was doing well and then began draining 2 days ago. States clear liquid now, saw some blood two days ago and blamed his undies rubbing on it. Wife took a look last night and saw clear drainage. He denies pain, has occasional sting and blames friction. He has not self treated except for soap or water. Denies fever, chills and sweats.   He now presents.  Hypertension History:      He denies headache, chest pain, palpitations, dyspnea with exertion, orthopnea, PND, peripheral edema, visual symptoms, neurologic problems, syncope, and side effects from treatment.  He notes no problems with any antihypertensive medication side effects.        Positive major cardiovascular risk factors include male age 43 years old or older, hyperlipidemia, hypertension, and current tobacco user.  Negative major cardiovascular risk factors include no history of diabetes and negative family history for ischemic heart disease.        Further assessment for target organ damage reveals no history of ASHD, stroke/TIA, or peripheral vascular disease.       Prior Medications Reviewed Using: Patient Recall  Updated Prior Medication List: ADULT ASPIRIN EC LOW STRENGTH 81 MG  TBEC (ASPIRIN) One daily COREG CR 40 MG  CP24 (CARVEDILOL PHOSPHATE) One daily ALLOPURINOL 100 MG  TABS (ALLOPURINOL) One daily LEXAPRO 10 MG  TABS (ESCITALOPRAM  OXALATE) One daily CADUET 10-20 MG  TABS (AMLODIPINE-ATORVASTATIN) One daily PANTOPRAZOLE SODIUM 40 MG  TBEC (PANTOPRAZOLE SODIUM) One daily DIOVAN 320 MG  TABS (VALSARTAN) One daily COLCHICINE 0.6 MG  TABS (COLCHICINE) One two times a day CATAPRES 0.1 MG  TABS (CLONIDINE HCL) two times a day LABETALOL HCL 200 MG  TABS (LABETALOL HCL) One two times a day  Current Allergies (reviewed today): ! SULF-10 (SULFACETAMIDE SODIUM)    Risk Factors:     Counseled to quit/cut down tobacco use:  yes   Review of Systems      See HPI  MS      Agrees with B12 and folate for foot parasthesia today. See last note.   Physical Exam  General:     Well-developed,well-nourished,in no acute distress; alert,appropriate and cooperative throughout examination. Obese. Lungs:     Normal respiratory effort, chest expands symmetrically. Lungs are clear to auscultation, no crackles or wheezes. Heart:     Normal rate and regular rhythm. S1 and S2 normal without gallop, murmur, click, rub or other extra sounds. Abdomen:     Right gron shows 8 cm scar with small area of opening mid point < 5 mm.Tender. No draiange. Wears briefs and friction noted. Extremities:     No clubbing, cyanosis, edema, or deformity noted  with normal full range of motion of all joints.  Declines foot exam. States wants to wait on eval until next visit    Impression & Recommendations:  Problem # 1:  INGUINAL PAIN, RIGHT (ICD-789.09) Surgical wound. Suspect irritation. Switch to boxers, start abx ointment two times a day with Telfa dressing. Councelled sx and signs of infection and patient to update if any concerns. Agrees. His updated medication list for this problem includes:    Adult Aspirin Ec Low Strength 81 Mg Tbec (Aspirin) ..... One daily   Problem # 2:  PARESTHESIA (ICD-782.0) Chck B12 and folate. Await result and optomize. Suspect foot sx related to plantar faciitis but diff includes DDD L-spine etc. Aware.Will keep  appt in Sept to optomize. Labs drawnin office. Orders: T- * Misc. Laboratory test [phone]) Venipuncture [phone])   Problem # 3:  HYPERTENSION (ICD-401.9) Improving. Rx as per [facility]. Limit salt, cont exersize and weight loss. His updated medication list for this problem includes:    Coreg Cr 40 Mg Cp24 (Carvedilol phosphate) ..... One daily    Caduet 10-20 Mg Tabs (Amlodipine-atorvastatin) ..... One daily    Diovan 320 Mg Tabs (Valsartan) ..... One daily    Catapres 0.1 Mg Tabs (Clonidine hcl) .[name]..[name] Two times a day    Labetalol Hcl 200 Mg Tabs (Labetalol hcl) ..... One two times a day   Problem # 4:  TOBACCO ABUSE (ICD-305.1) Cont to cut back. Advised risk and benefit. Aware. Not open to rx.  Complete Medication List: 1)  Adult Aspirin Ec Low Strength 81 Mg Tbec (Aspirin) .... One daily 2)  Coreg Cr 40 Mg Cp24 (Carvedilol phosphate) .... One daily 3)  Allopurinol 100 Mg Tabs (Allopurinol) .... One daily 4)  Lexapro 10 Mg Tabs (Escitalopram oxalate) .... One daily 5)  Caduet 10-20 Mg Tabs (Amlodipine-atorvastatin) .... One daily 6)  Pantoprazole Sodium 40 Mg Tbec (Pantoprazole sodium) .... One daily 7)  Diovan 320 Mg Tabs (Valsartan) .... One daily 8)  Colchicine 0.6 Mg Tabs (Colchicine) .... One two times a day 9)  Catapres 0.1 Mg Tabs (Clonidine hcl) .... Two times a day 10)  Labetalol Hcl 200 Mg Tabs (Labetalol hcl) .... One two times a day  Hypertension Assessment/Plan:      The patient's hypertensive risk group is category B: At least one risk factor (excluding diabetes) with no target organ damage.  His calculated 10 year risk of coronary heart disease is 18 %.  Today's blood pressure is 145/88.  His blood pressure goal is < 125/75.   Patient Instructions: 1)  Please schedule a follow-up appointment in 1 month.   ]

## 2010-05-03 NOTE — Medication Information (Signed)
Summary: [facility] merchand[facility]   Imported By: Eliezer Mccoy 05/21/2007 [id number]  _____________________________________________________________________  External Attachment:    Type:   Image     Comment:   External Document

## 2010-05-03 NOTE — Consult Note (Signed)
Summary: Dermatology  Dermatology   Imported By: Meyer Cory 11/27/2008 15:22:00  _____________________________________________________________________  External Attachment:    Type:   Image     Comment:   External Document

## 2010-05-03 NOTE — Letter (Signed)
Summary: EKG  EKG   Imported By: Wyatt Mage LPN [id number]  _____________________________________________________________________  External Attachment:    Type:   Image     Comment:   External Document

## 2010-05-03 NOTE — Letter (Signed)
Summary: Flowsheets  Flowsheets   Imported By: Wyatt Mage LPN [id number]  _____________________________________________________________________  External Attachment:    Type:   Image     Comment:   External Document

## 2010-05-03 NOTE — Procedures (Signed)
Summary: Gastroenterology  Gastroenterology   Imported By: Eliezer Mccoy 08/19/2008 12:46:30  _____________________________________________________________________  External Attachment:    Type:   Image     Comment:   External Document

## 2010-05-03 NOTE — Progress Notes (Signed)
Summary: Vascular referral  Phone Note Outgoing Call   Call placed by: Druscilla Brownie,  July 24, 2007 1:19 PM Summary of Call: Called [facility] in [place] and message left  to return call .................................................................Marland KitchenMarland KitchenOllen Gross Long  July 24, 2007 1:20 PM  Called returned from vascular and records faxed for them to review and call with appointment date/time .................................................................Marland KitchenMarland KitchenDruscilla Brownie  July 24, 2007 2:09 PM   Follow-up for Phone Call        [facility] with patient's appt time and date: May 14th,2009 at 2:30pm....please make patient  aware. Follow-up by: Durwin Reges,  July 24, 2007 3:40 PM  Additional Follow-up for Phone Call Additional follow up Details #1::        Patient called and referral info given, voices understanding  Additional Follow-up by: Druscilla Brownie,  July 25, 2007 4:22 PM

## 2010-05-03 NOTE — Letter (Signed)
Summary: [facility] Office Visits  [facility] Office Visits   Imported By: Wyatt Mage LPN [id number]  _____________________________________________________________________  External Attachment:    Type:   Image     Comment:   External Document

## 2010-05-03 NOTE — Assessment & Plan Note (Signed)
Summary: re-established/went to [place]/arc   Vital Signs:  Patient Profile:   65 Years Old Male Height:     68.75 inches Weight:      253 pounds BMI:     37.77 O2 Sat:      97 % Temp:     97.8 degrees F Pulse rate:   81 / minute Resp:     16 per minute BP sitting:   179 / 116  Vitals Entered By: Deidre Ala (January 31, 2007 2:47 PM)                 PCP:  Weston Settle, MD  Chief Complaint:  re-establish.  History of Present Illness: Pt in for re-establishment.  He just moved back from [facility]. He states this was out of need due to his grandson taking advantage of his property here. He is disappointed in him and he is still looking for him.  He had blood work in [place] three months ago. Told all looked good but he was told he had salt deficiency. He saw Dr. Sandy Salaam and he was a GP as far as he nows.  He has struggled with HTN. Needs recheck on this and other chronic conditions.  He now presents.  Hypertension History:      He denies headache, chest pain, palpitations, dyspnea with exertion, orthopnea, PND, peripheral edema, visual symptoms, neurologic problems, syncope, and side effects from treatment.  Further comments include: Took meda about 2 hours ago. No sx. He is watching salt intake. No adding any to it.  He does not exersize. Had eye exam 41months ago - new glasses.  Has not seen dentists.        Positive major cardiovascular risk factors include male age 67 years old or older, hyperlipidemia, hypertension, and current tobacco user.  Negative major cardiovascular risk factors include no history of diabetes and negative family history for ischemic heart disease.        Further assessment for target organ damage reveals no history of ASHD, stroke/TIA, or peripheral vascular disease.    Lipid Management History:      Positive NCEP/ATP III risk factors include male age 78 years old or older, current tobacco user, and hypertension.  Negative NCEP/ATP III  risk factors include non-diabetic, no family history for ischemic heart disease, no ASHD (atherosclerotic heart disease), no prior stroke/TIA, no peripheral vascular disease, and no history of aortic aneurysm.        The patient states that he knows about the "Therapeutic Lifestyle Change" diet.  His compliance with the TLC diet is fair.  The patient expresses understanding of adjunctive measures for cholesterol lowering.  Adjunctive measures started by the patient include aerobic exercise, fiber, ASA, and omega-3 supplements.  Comments: He is not eating the best. STates he knows he can do better.    Current Allergies (reviewed today): ! SULF-10 (SULFACETAMIDE SODIUM)  Past Medical History:    Reviewed history and no changes required:       Anxiety       Gout       Hyperlipidemia       Hypertension  Past Surgical History:    Reviewed history and no changes required:       Broken arms       Hernia repair - umbilical       Collapsed lung - right - hx of pushing xray machine and had spontaneous pneuomothorax       Kidney stones   Family  History:    Reviewed history and no changes required:       Father: deceased, lung ca/ asbestos 36       Mother: deceased  25 Alzheimers       Siblings: 1 sister 34, hx unknown                      2 brothers, 26, 7, HTN, hrt problems, renal cancer  Social History:    Reviewed history and no changes required:       Married       Occupation: Theatre manager       Current Smoker       Alcohol use-yes   Risk Factors:  Tobacco use:  current    Year started:  77    Cigarettes:  Yes -- 1 pack(s) per day    Counseled to quit/cut down tobacco use:  yes Alcohol use:  yes  Family History Risk Factors:    Family History of MI in females < 47 years old:  no    Family History of MI in males < 55 years old:  no   Review of Systems  General      Complains of fatigue and malaise.      He is sleeping good. He has a CPAP machine. Dx in  [place]. Doing well. He is irrittable at times. His concentration is good. Energy is low. He has stress in life in general.  CV      See HPI  Resp      Denies cough, shortness of breath, sputum productive, and wheezing.      Not ready toquit smoking.  GI      Denies abdominal pain, constipation, diarrhea, nausea, and vomiting.  GU      Denies decreased libido, nocturia, urinary frequency, and urinary hesitancy.  MS      Complains of joint pain.      He has pain over left and right upper hip bones. States worse with sitting. Describes pain as aching. Worse  with sitting.Better with lying down.Localized. Notes used Aleve and it helps a lot. No trauma or injury.  Neuro      Denies headaches, numbness, seizures, tingling, and tremors.  Psych      Denies anxiety and depression.      Doing good on Lexapro.  Endo      Denies cold intolerance, excessive hunger, excessive thirst, excessive urination, heat intolerance, polyuria, and weight change.      Has some urinary frequency.   Physical Exam  General:     Well-developed,well-nourished,in no acute distress; alert,appropriate and cooperative throughout examination. Obese. Lungs:     Normal respiratory effort, chest expands symmetrically. Lungs are clear to auscultation, no crackles or wheezes. Heart:     Normal rate and regular rhythm. S1 and S2 normal without gallop, murmur, click, rub or other extra sounds. Abdomen:     Bowel sounds positive,abdomen soft and non-tender without masses, organomegaly or hernias noted. Extremities:     No clubbing, cyanosis, edema, or deformity noted with normal full range of motion of all joints.   Neurologic:     No cranial nerve deficits noted. Station and gait are normal. Plantar reflexes are down-going bilaterally. DTRs are symmetrical throughout. Sensory, motor and coordinative functions appear intact.Mild paraspinous muscle spasm low back. Neg SLT. Gait normal Cervical Nodes:     No  lymphadenopathy noted Psych:     Cognition and judgment appear intact.  Alert and cooperative with normal attention span and concentration. No apparent delusions, illusions, hallucinations    Impression & Recommendations:  Problem # 1:  HYPERTENSION (ICD-401.9) Discussed. BP very high. Sites just took meds. Wants recheck in few weeks to assure not just nerves from coming back to [place]. Advised agree. Councelled diet, exersize and low salt intake as well as need for weight loss, exersize and portion control. He is up to date for eye exam and was advised need for dental care. Recheck two weeks and optomize Diovan. His updated medication list for this problem includes:    Coreg Cr 40 Mg Cp24 (Carvedilol phosphate) ..... One daily    Caduet 10-10 Mg Tabs (Amlodipine-atorvastatin) ..... One daily    Diovan 160 Mg Tabs (Valsartan) ..... One daily   Problem # 2:  HYPERLIPIDEMIA (ICD-272.4) Med as is. Get most recnet labs from [facility] in [place] if possible and optomize per ATP III. Again councelled TLC, diet and exersize. His updated medication list for this problem includes:    Caduet 10-10 Mg Tabs (Amlodipine-atorvastatin) ..... One daily   Problem # 3:  TOBACCO ABUSE (ICD-305.1) Advised cessation. In pre-contemplation. Councelled need for cessation given cancer, COPD and death risk. Aware. Optomize next few visits.  Problem # 4:  SLEEP APNEA - 4CM H2O (ICD-780.57) New dx. get records. Use as directed.   Problem # 5:  OBESITY NOS (ICD-278.00) See above.  Problem # 6:  BACK PAIN (ICD-724.5) Suspect sprain.Trial Flexeril. Gave handout on back exersizes. If persist, get xrays and optomize. His updated medication list for this problem includes:    Adult Aspirin Ec Low Strength 81 Mg Tbec (Aspirin) ..... One daily    Flexeril 10 Mg Tabs (Cyclobenzaprine hcl) .[name]... Three times a day   Problem # 7:  Preventive Health Care (ICD-V70.0) Flu-shot today. Review old record for PSA, rectal, colonoscopy etc.  Agrees. Advised risk and benefit of flu-vaccine. Aware. Follow-up if any concern.  Complete Medication List: 1)  Adult Aspirin Ec Low Strength 81 Mg Tbec (Aspirin) .... One daily 2)  Coreg Cr 40 Mg Cp24 (Carvedilol phosphate) .... One daily 3)  Allopurinol 100 Mg Tabs (Allopurinol) .... One daily 4)  Lexapro 10 Mg Tabs (Escitalopram oxalate) .... One daily 5)  Caduet 10-10 Mg Tabs (Amlodipine-atorvastatin) .... One daily 6)  Pantoprazole Sodium 40 Mg Tbec (Pantoprazole sodium) .... One daily 7)  Diovan 160 Mg Tabs (Valsartan) .... One daily 8)  Flexeril 10 Mg Tabs (Cyclobenzaprine hcl) .... Three times a day  Other Orders: Influenza Vaccine MCR [id number])  Hypertension Assessment/Plan:      The patient's hypertensive risk group is category B: At least one risk factor (excluding diabetes) with no target organ damage.  Today's blood pressure is 179/116.  His blood pressure goal is < 140/90.  Lipid Assessment/Plan:      Based on NCEP/ATP III, the patient's risk factor category is "2 or more risk factors and a calculated 10 year CAD risk of > 20%".  From this information, the patient's calculated lipid goals are as follows: Total cholesterol goal is 200; LDL cholesterol goal is 100; HDL cholesterol goal is 40; Triglyceride goal is 150.     Patient Instructions: 1)  Please schedule a follow-up appointment in 2 weeks.    Prescriptions: FLEXERIL 10 MG  TABS (CYCLOBENZAPRINE HCL) three times a day  #15 x 0   Entered and Authorized by:   Weston Settle MD   Signed by:   Weston Settle MD on 01/31/2007  Method used:   Print then Give to Patient   RxID:   [id number]  ]  Preventive Care Screening  Last Flu Shot:    Date:  01/31/2007    Next Due:  02/2008    Results:  given    Influenza Vaccine    Vaccine Type: Fluvax MCR    Site: right deltoid    Mfr: novartis    Dose: 0.5 ml    Route: IM    Given by: Deidre Ala    Exp. Date: 08/02/2007    VIS given: 09/30/04  version given January 31, 2007.  Flu Vaccine Consent Questions    Do you have a history of severe allergic reactions to this vaccine? no    Any prior history of allergic reactions to egg and/or gelatin? no    Do you have a sensitivity to the preservative Thimersol? no    Do you have a past history of Guillan-Barre Syndrome? no    Do you currently have an acute febrile illness? no    Have you ever had a severe reaction to latex? no    Vaccine information given and explained to patient? yes  Appended Document: re-established/went to [place]/arc records requested from [place]...[id number].Marland KitchenMarland Kitchenarc

## 2010-05-03 NOTE — Letter (Signed)
Summary: [facility]  [facility]   Imported By: Eliezer Mccoy 09/11/2007 14:40:19  _____________________________________________________________________  External Attachment:    Type:   Image     Comment:   External Document

## 2010-05-03 NOTE — Assessment & Plan Note (Signed)
Summary: Heel Pain/RRS   Vital Signs:  Patient Profile:   65 Years Old Male Height:     68.75 inches Weight:      251 pounds BMI:     37.47 O2 Sat:      96 % Temp:     97.5 degrees F Pulse rate:   81 / minute Resp:     16 per minute BP sitting:   174 / 119  Vitals Entered By: Deidre Ala (February 21, 2007 1:17 PM)                 PCP:  Weston Settle, MD  Chief Complaint:  Needs heel inj.[name]  History of Present Illness: Pt in for recheck.  He has a long hx of plantar fasciitis. He states he has had heel pain for a long time. He has had two injections in the right and one in the left. This was done in [place], [place]. This was at least 3 years ago. Had second a year or so ago. Helpes a lot. He is strecthing feet. He has not self treated with anything and is curious what to do. He does not want shots if possible.  He had labs in [facility] on October 23, 2006. Lipids were done - TC 199, Trig 197, HDL 41, LDL 119. LFTs were normal. Uric acid was 8.4. He had a PSA in June and this was 1.6. CBC was unremarkable.   He is also worried about his toenails. Thick and yellow. Hatesthis. He has not been treated for this.  Now presents.  Hypertension History:      He denies headache, chest pain, palpitations, dyspnea with exertion, orthopnea, PND, peripheral edema, visual symptoms, neurologic problems, syncope, and side effects from treatment.  He notes no problems with any antihypertensive medication side effects.  Further comments include: Taking medications daily. He has not had a renal artery eval.  Strong family hx of HTN.        Positive major cardiovascular risk factors include male age 84 years old or older, hyperlipidemia, hypertension, and current tobacco user.  Negative major cardiovascular risk factors include no history of diabetes and negative family history for ischemic heart disease.        Further assessment for target organ damage reveals no history of ASHD, stroke/TIA, or  peripheral vascular disease.    Lipid Management History:      Positive NCEP/ATP III risk factors include male age 24 years old or older, current tobacco user, and hypertension.  Negative NCEP/ATP III risk factors include non-diabetic, no family history for ischemic heart disease, no ASHD (atherosclerotic heart disease), no prior stroke/TIA, no peripheral vascular disease, and no history of aortic aneurysm.        The patient states that he knows about the "Therapeutic Lifestyle Change" diet.  His compliance with the TLC diet is fair.  The patient expresses understanding of adjunctive measures for cholesterol lowering.  Adjunctive measures started by the patient include aerobic exercise, fiber, ASA, and omega-3 supplements.  Comments: On Caduet. Tolerating well.    Current Allergies (reviewed today): ! SULF-10 (SULFACETAMIDE SODIUM)  Past Medical History:    Reviewed history from 01/31/2007 and no changes required:       Anxiety       Gout       Hyperlipidemia       Hypertension  Past Surgical History:    Reviewed history from 01/31/2007 and no changes required:  Broken arms       Hernia repair - umbilical       Collapsed lung - right - hx of pushing xray machine and had spontaneous pneuomothorax       Kidney stones   Family History:    Reviewed history from 01/31/2007 and no changes required:       Father: deceased, lung ca/ asbestos 32       Mother: deceased  30 Alzheimers       Siblings: 1 sister 69, hx unknown                      2 brothers, 64, 16, HTN, hrt problems, renal cancer  Social History:    Reviewed history from 01/31/2007 and no changes required:       Married       Occupation: Theatre manager       Current Smoker       Alcohol use-yes    Review of Systems      See HPI   Physical Exam  General:     Well-developed,well-nourished,in no acute distress; alert,appropriate and cooperative throughout examination. Obese. Lungs:     Normal  respiratory effort, chest expands symmetrically. Lungs are clear to auscultation, no crackles or wheezes. Heart:     Normal rate and regular rhythm. S1 and S2 normal without gallop, murmur, click, rub or other extra sounds. Abdomen:     Bowel sounds positive,abdomen soft and non-tender without masses, organomegaly or hernias noted. Extremities:     No clubbing, cyanosis, edema, or deformity noted with normal full range of motion of all joints.  Tender feet over heel in insertion site of plantar fasciitis. Thick yellow toenails. Neurologic:     No cranial nerve deficits noted. Station and gait are normal. Plantar reflexes are down-going bilaterally. DTRs are symmetrical throughout. Sensory, motor and coordinative functions appear intact.Mild paraspinous muscle spasm low back. Neg SLT. Gait normal Cervical Nodes:     No lymphadenopathy noted Psych:     Cognition and judgment appear intact. Alert and cooperative with normal attention span and concentration. No apparent delusions, illusions, hallucinations    Impression & Recommendations:  Problem # 1:  PLANTAR FASCIITIS, BILATERAL (ICD-728.71) Discussed. Handout given. Advised dx and treatment options. Educated proper foot ware. He is not ready for podiatry consult or injection. Trial Medrol Pack. Recheck 6 weeks.  Problem # 2:  HYPERTENSION (ICD-401.9) Poor control. Increase Diovan.Samples given. Recheck 6 weeks. If remain high, check urine studies and get renal artery eval. Agrees. Advised low salt intake, eye care and also asked him to make sure he checks home readings once a week with machine and log here next visit to assure this is notr white coat syndrome. Agrees.  His updated medication list for this problem includes:    Coreg Cr 40 Mg Cp24 (Carvedilol phosphate) ..... One daily    Caduet 10-20 Mg Tabs (Amlodipine-atorvastatin) ..... One daily    Diovan 320 Mg Tabs (Valsartan) ..... One daily   Problem # 3:  HYPERLIPIDEMIA  (ICD-272.4) Not at goal. Increase Caduet. Gave voucher. Recheck LFTs in 6 weeks. TLC a must. His updated medication list for this problem includes:    Caduet 10-20 Mg Tabs (Amlodipine-atorvastatin) ..... One daily   Problem # 4:  GOUT (ICD-274.9) Stable. Med as is. Uric acid repeat next visit. His updated medication list for this problem includes:    Allopurinol 100 Mg Tabs (Allopurinol) ..... One daily  Problem # 5:  ONYCHOMYCOSIS (ICD-110.1) Very thick hard toenails. Sample taken left great toe. Send for fungal culture. Await result. Treat as needed. Pt advised and agreeable.  Complete Medication List: 1)  Adult Aspirin Ec Low Strength 81 Mg Tbec (Aspirin) .... One daily 2)  Coreg Cr 40 Mg Cp24 (Carvedilol phosphate) .... One daily 3)  Allopurinol 100 Mg Tabs (Allopurinol) .... One daily 4)  Lexapro 10 Mg Tabs (Escitalopram oxalate) .... One daily 5)  Caduet 10-20 Mg Tabs (Amlodipine-atorvastatin) .... One daily 6)  Pantoprazole Sodium 40 Mg Tbec (Pantoprazole sodium) .... One daily 7)  Diovan 320 Mg Tabs (Valsartan) .... One daily 8)  Flexeril 10 Mg Tabs (Cyclobenzaprine hcl) .... Three times a day 9)  Medrol (pak) 4 Mg Tabs (Methylprednisolone) .... As directed  Hypertension Assessment/Plan:      The patient's hypertensive risk group is category B: At least one risk factor (excluding diabetes) with no target organ damage.  Today's blood pressure is 174/119.  His blood pressure goal is < 140/90.  Lipid Assessment/Plan:      Based on NCEP/ATP III, the patient's risk factor category is "2 or more risk factors and a calculated 10 year CAD risk of > 20%".  From this information, the patient's calculated lipid goals are as follows: Total cholesterol goal is 200; LDL cholesterol goal is 100; HDL cholesterol goal is 40; Triglyceride goal is 150.       Prescriptions: CADUET 10-20 MG  TABS (AMLODIPINE-ATORVASTATIN) One daily  #30 x 6   Entered and Authorized by:   Weston Settle  MD   Signed by:   Weston Settle MD on 02/21/2007   Method used:   Print then Give to Patient   [id number] MEDROL (PAK) 4 MG  TABS (METHYLPREDNISOLONE) As directed  #1 x 0   Entered and Authorized by:   Weston Settle MD   Signed by:   Weston Settle MD on 02/21/2007   Method used:   Print then Give to Patient   RxID:   [id number]  ]

## 2010-05-03 NOTE — Assessment & Plan Note (Signed)
Summary: FOLLOW UP 2 WEEK/SLJ   Vital Signs:  Patient Profile:   65 Years Old Male Height:     68.75 inches Weight:      249 pounds BMI:     37.17 O2 Sat:      962 % Temp:     97.2 degrees F Pulse rate:   67 / minute Resp:     14 per minute BP sitting:   203 / 130  Vitals Entered By: Deidre Ala (July 30, 2007 9:48 AM)                 PCP:  Weston Settle, MD  Chief Complaint:  MRI follow up .  History of Present Illness: Pt in for recheck.  He has refractory HTN and underwent a renal MRA. This is reviewed:  IMPRESSION: No evidence of renal artery stenosis. Penetrating atherosclerotic ulcer in the distal aorta.  Renal and liver cysts.  He has an appt to see Vascular surgery on Aug 15, 2007 related to the atherosclerotic ulcer. He also had labs done and this is reviewed:  1. CBC - normal 2. CMP - normal 3. Lipids - see below 4.TSH - normal 5. Uric Acid - normal  He risk factors for atherosclerosis include:  1. Man 2. Age > 60 3. OPoorly controlled HTN 4.Hyperlipidemia 5. Non diabetic. 6. Smokers 7. Fam hx: Negative for above 8. Obese.  He now presents.   Hypertension History:      He denies headache, chest pain, palpitations, dyspnea with exertion, orthopnea, PND, peripheral edema, visual symptoms, neurologic problems, syncope, and side effects from treatment.        Positive major cardiovascular risk factors include male age 30 years old or older, hyperlipidemia, hypertension, and current tobacco user.  Negative major cardiovascular risk factors include no history of diabetes and negative family history for ischemic heart disease.        Further assessment for target organ damage reveals no history of ASHD, stroke/TIA, or peripheral vascular disease.    Lipid Management History:      Positive NCEP/ATP III risk factors include male age 83 years old or older, HDL cholesterol less than 40, current tobacco user, and hypertension.  Negative NCEP/ATP III  risk factors include non-diabetic, no family history for ischemic heart disease, no ASHD (atherosclerotic heart disease), no prior stroke/TIA, no peripheral vascular disease, and no history of aortic aneurysm.        The patient states that he knows about the "Therapeutic Lifestyle Change" diet.  His compliance with the TLC diet is poor.  Adjunctive measures started by the patient include aerobic exercise, fiber, ASA, and omega-3 supplements.  Comments: States piss poor with diet and exersize.      Prior Medications Reviewed Using: Patient Recall  Updated Prior Medication List: ADULT ASPIRIN EC LOW STRENGTH 81 MG  TBEC (ASPIRIN) One daily COREG CR 40 MG  CP24 (CARVEDILOL PHOSPHATE) One daily ALLOPURINOL 100 MG  TABS (ALLOPURINOL) One daily LEXAPRO 10 MG  TABS (ESCITALOPRAM OXALATE) One daily CADUET 10-20 MG  TABS (AMLODIPINE-ATORVASTATIN) One daily PANTOPRAZOLE SODIUM 40 MG  TBEC (PANTOPRAZOLE SODIUM) One daily DIOVAN 320 MG  TABS (VALSARTAN) One daily LAMISIL 250 MG  TABS (TERBINAFINE HCL) One daily COLCHICINE 0.6 MG  TABS (COLCHICINE) One two times a day CATAPRES 0.1 MG  TABS (CLONIDINE HCL) two times a day  Current Allergies (reviewed today): ! SULF-10 (SULFACETAMIDE SODIUM)  Past Medical History:    Reviewed history from 01/31/2007 and no changes  required:       Anxiety       Gout       Hyperlipidemia       Hypertension  Past Surgical History:    Reviewed history from 01/31/2007 and no changes required:       Broken arms       Hernia repair - umbilical       Collapsed lung - right - hx of pushing xray machine and had spontaneous pneuomothorax       Kidney stones   Family History:    Reviewed history from 01/31/2007 and no changes required:       Father: deceased, lung ca/ asbestos 27       Mother: deceased  49 Alzheimers       Siblings: 1 sister 2, hx unknown                      2 brothers, 66, 55, HTN, hrt problems, renal cancer  Social History:    Reviewed  history from 01/31/2007 and no changes required:       Married       Occupation: Theatre manager       Current Smoker       Alcohol use-yes   Risk Factors:  Tobacco use:  current    Year started:  27    Cigarettes:  Yes -- 1 pack(s) per day    Counseled to quit/cut down tobacco use:  yes Alcohol use:  yes  Family History Risk Factors:    Family History of MI in females < 12 years old:  no    Family History of MI in males < 64 years old:  no   Review of Systems      See HPI  General      Denies chills, fever, and sweats.  Resp      Denies cough, shortness of breath, sputum productive, and wheezing.  GI      Denies abdominal pain, constipation, diarrhea, nausea, and vomiting.  GU      Denies nocturia, urinary frequency, and urinary hesitancy.  MS      Gout resolved. Happy with result.   Neuro      Denies brief paralysis, headaches, numbness, tingling, and tremors.  Psych      Denies anxiety and depression.      On Lexapro. States gets very worried about things. Wife present today and admits to same. States he obcesses about things and then gets agitated. According to her he drove in [place] traffic and the stress from this amde him get anxious.   Physical Exam  General:     Well-developed,well-nourished,in no acute distress; alert,appropriate and cooperative throughout examination. Obese. Lungs:     Normal respiratory effort, chest expands symmetrically. Lungs are clear to auscultation, no crackles or wheezes. Heart:     Normal rate and regular rhythm. S1 and S2 normal without gallop, murmur, click, rub or other extra sounds. Abdomen:     Bowel sounds positive,abdomen soft and non-tender without masses, organomegaly or hernias noted. Extremities:     No clubbing, cyanosis, edema, or deformity noted with normal full range of motion of all joints.   Neurologic:     No cranial nerve deficits noted. Station and gait are normal. Plantar reflexes are  down-going bilaterally. DTRs are symmetrical throughout. Sensory, motor and coordinative functions appear intact. Cervical Nodes:     No lymphadenopathy noted Psych:     Cognition  and judgment appear intact. Alert and cooperative with normal attention span and concentration. No apparent delusions, illusions, hallucinations    Impression & Recommendations:  Problem # 1:  ? of ABDOMINAL AORTIC ANEURYSM (ICD-441.4) Discussed. See Vascular surgery as scheduled. Needs aggressive risk factor optomization and reviewed this at length with him. Aware and notes ready to try.  Problem # 2:  HYPERTENSION (ICD-401.9) BP refractory to Rx with patient assuring me using Rx. Normal Renal MRA and advised need for 24 hr urine eval - see orders. Pending this, if normal, will refer to Nephrology to optomize. Agrees. Limit salt. Weight loss a must. Advised sx and signs of CVA and CAd and advised immediate ED eval if develops sx. Agrees. Proteinuria on UA. Pt advised and agreeable with further eval as well as nephrology consult pending 24 hr urine. His updated medication list for this problem includes:    Coreg Cr 40 Mg Cp24 (Carvedilol phosphate) ..... One daily    Caduet 10-20 Mg Tabs (Amlodipine-atorvastatin) ..... One daily    Diovan 320 Mg Tabs (Valsartan) ..... One daily    Catapres 0.1 Mg Tabs (Clonidine hcl) .[name]..[name] Two times a day  Orders: UA Dipstick w/o Micro (manual) (81002) T- * Misc. Laboratory test [phone])   Problem # 3:  HYPERLIPIDEMIA (P102836.4) Trig high and HDL low. On Caduet. Agrees with TLC for 3 months. If remians, add Niaspan. His updated medication list for this problem includes:    Caduet 10-20 Mg Tabs (Amlodipine-atorvastatin) ..... One daily   Problem # 4:  TOBACCO ABUSE (ICD-305.1) Councelled. Not open to rx - states will quit on own. Recheck 2 weeks and aide in process. Aware of cancer and death risk with this and also educated how this plays into cardiovascular risk  Problem  # 5:  OBESITY NOS (ICD-278.00) Councelled diet, exersize and portion control. Advised 2000 kcal diet.   Problem # 6:  GOUT (ICD-274.9) Rx as below. Avoid precipittors. His updated medication list for this problem includes:    Allopurinol 100 Mg Tabs (Allopurinol) ..... One daily    Colchicine 0.6 Mg Tabs (Colchicine) ..... One two times a day   Problem # 7:  ANXIETY (ICD-300.00) On lexapro. He states well controlled. Wife argues point. Will recheck two weeks and optomize. His updated medication list for this problem includes:    Lexapro 10 Mg Tabs (Escitalopram oxalate) ..... One daily   Complete Medication List: 1)  Adult Aspirin Ec Low Strength 81 Mg Tbec (Aspirin) .... One daily 2)  Coreg Cr 40 Mg Cp24 (Carvedilol phosphate) .... One daily 3)  Allopurinol 100 Mg Tabs (Allopurinol) .... One daily 4)  Lexapro 10 Mg Tabs (Escitalopram oxalate) .... One daily 5)  Caduet 10-20 Mg Tabs (Amlodipine-atorvastatin) .... One daily 6)  Pantoprazole Sodium 40 Mg Tbec (Pantoprazole sodium) .... One daily 7)  Diovan 320 Mg Tabs (Valsartan) .... One daily 8)  Lamisil 250 Mg Tabs (Terbinafine hcl) .... One daily 9)  Colchicine 0.6 Mg Tabs (Colchicine) .... One two times a day 10)  Catapres 0.1 Mg Tabs (Clonidine hcl) .... Two times a day  Hypertension Assessment/Plan:      The patient's hypertensive risk group is category B: At least one risk factor (excluding diabetes) with no target organ damage.  His calculated 10 year risk of coronary heart disease is 22 %.  Today's blood pressure is 203/130.  His blood pressure goal is < 140/90.  Lipid Assessment/Plan:      Based on NCEP/ATP III, the patient's  risk factor category is "2 or more risk factors and a calculated 10 year CAD risk of > 20%".  From this information, the patient's calculated lipid goals are as follows: Total cholesterol goal is 200; LDL cholesterol goal is 100; HDL cholesterol goal is 40; Triglyceride goal is 150.     Patient  Instructions: 1)  Please schedule a follow-up appointment in 2 weeks.    ] Laboratory Results   Urine Tests    Routine Urinalysis   Color: yellow Appearance: Clear Glucose: negative   (Normal Range: Negative) Bilirubin: negative   (Normal Range: Negative) Ketone: negative   (Normal Range: Negative) Spec. Gravity: 1.025   (Normal Range: 1.003-1.035) Blood: trace-intact   (Normal Range: Negative) pH: 6.0   (Normal Range: 5.0-8.0) Protein: >=300   (Normal Range: Negative) Urobilinogen: 0.2   (Normal Range: 0-1) Nitrite: negative   (Normal Range: Negative) Leukocyte Esterace: negative   (Normal Range: Negative)

## 2010-05-03 NOTE — Letter (Signed)
Summary: Vital Signs Flowsheet  Vital Signs Flowsheet   Imported By: Wyatt Mage LPN 624THL [id number]  _____________________________________________________________________  External Attachment:    Type:   Image     Comment:   External Document

## 2010-05-03 NOTE — Letter (Signed)
Summary: Generic Letter  Press [facility], [place]  [address] [place]   [place], [place] [address]   Phone: [phone]  Fax: [phone]        October 26, 2009 MRN: [id number]    [name] [address] [place], [place]  [address] DOB [birth year]   Mr. Rayburn Go can return to work July 30,2011 without restrictions.         Sincerely,    Braidan Ricciardi,MD  This letter has been electronically signed by your physician.

## 2010-05-03 NOTE — Assessment & Plan Note (Signed)
Summary: follow up 6 week/slj   Vital Signs:  Patient profile:   65 year old male Height:      68 inches Weight:      262 pounds BMI:     39.98 O2 Sat:      97 % Temp:     98.3 degrees F Pulse rate:   76 / minute Resp:     16 per minute BP sitting:   157 / 101  Vitals Entered By: Deidre Ala LPN (June 28, 624THL 075-GRM PM)  Nutrition Counseling: Patient's BMI is greater than 25 and therefore counseled on weight management options. CC: follow-up visit, Lipid Management   Primary Provider:  Weston Settle MD  CC:  follow-up visit and Lipid Management.  History of Present Illness: Pt in for recheck.  He notes he is doing well. He never heard from GI about his polyps and when he is supposed to follow-up for recheck. This is thus reviewed and I showed him copy of note in EMR. Benign polyps were found and recheck scope is advised in 3 years. Pathology is checked:  1.  COLON, HEPATIC FLEXURE, POLYPS: - FRAGMENTS OF TUBULAR ADENOMAS.  - HIGH GRADE DYSPLASIA IS NOT IDENTIFIED.    2.  RECTUM, POLYP: - HYPERPLASTIC POLYP. - THERE IS NO EVIDENCE OF MALIGNANCY.   He is please with this and flowsheet is updated.   He cont with peripheral neuropathy. He has had extensive eval for this with ABIs, NCV, back films and he even saw Dr. Doylene Canning of Neurology in [place]. Started on Vit B12 (missed last months dose) and Vit D. He states it has helped and legs a little better. States no cramping as before. He had normal levesl March 2010. He states feet burn and ache. He is curious about podiatry consult.   He has a dry spot over left temple and right temple. He also has skin tags. Wants derm eval and curious if I can refer.   He has a hx of tobacco abuse and exertinal dyspnea. Declined work-up unless he tried Spiriva and Proventil. He has used Spiriva twice and states has not needed since. He denies symptoms of dyspnea. He denies chest pain, orthopnea, PND, palpitations and leg swelling. Declines  further eval for now.   He cont to smoke and does not want  to discuss today. He is obese. He is trying to eat better. He does not exersize. He adds he is eating a lot of chicken - baked and roasted. He loves salad and states uses regular dressing. His weight is up one pound today.  He has HTN and was refrred to see Dr. Lowanda Foster. He stopped follow-up as he notes just would do lab and get sent away. BP there 130s/80s. Always high here. States home readings similar at Dr. Florentina Addison but not here. He states gets nervous here.   He now presents.  Lipid Management History:      Positive NCEP/ATP III risk factors include male age 48 years old or older, HDL cholesterol less than 40, current tobacco user, and hypertension.  Negative NCEP/ATP III risk factors include non-diabetic, no family history for ischemic heart disease, no ASHD (atherosclerotic heart disease), no prior stroke/TIA, no peripheral vascular disease, and no history of aortic aneurysm.        The patient states that he knows about the "Therapeutic Lifestyle Change" diet.  His compliance with the TLC diet is fair.  The patient expresses understanding of adjunctive measures for cholesterol lowering.  He expresses no side effects from his lipid-lowering medication.  The patient denies any symptoms to suggest myopathy or liver disease.     Current Problems (verified): 1)  Skin Tag  (ICD-701.9) 2)  Dyspnea On Exertion  (ICD-786.09) 3)  Gerd  (ICD-530.81) 4)  Colonic Polyps, Hx of  (ICD-V12.72) 5)  Benign Prostatic Hypertrophy, With Obstruction  (ICD-600.01) 6)  Vitamin B12 Deficiency  (ICD-266.2) 7)  Vitamin D Deficiency  (ICD-268.9) 8)  Neuropathy - Sensory-motor  (ICD-355.9) 9)  Proteinuria  (ICD-791.0) 10)  Plantar Fasciitis, Bilateral  (ICD-728.71) 11)  Nephrolithiasis, Hx of  (ICD-V13.01) 12)  Back Pain  (ICD-724.5) 13)  Obesity Nos  (ICD-278.00) 14)  Sleep Apnea - 4cm H2o  (ICD-780.57) 15)  Tobacco Abuse  (ICD-305.1) 16)  Ptsd   (ICD-309.81) 17)  Pud  (ICD-533.90) 18)  Hypertension  (ICD-401.9) 19)  Hyperlipidemia  (ICD-272.4) 20)  Gout  (ICD-274.9) 21)  Anxiety  (ICD-300.00)  Current Medications (verified): 1)  Adult Aspirin Ec Low Strength 81 Mg  Tbec (Aspirin) .... One Daily 2)  Carvedilol 12.5 Mg Tabs (Carvedilol) .... Two Times A Day 3)  Lexapro 10 Mg  Tabs (Escitalopram Oxalate) .... One Daily 4)  Caduet 10-20 Mg Tabs (Amlodipine-Atorvastatin) .... One Daily 5)  Pantoprazole Sodium 40 Mg  Tbec (Pantoprazole Sodium) .... One Daily 6)  Diovan 320 Mg  Tabs (Valsartan) .... One Daily 7)  Catapres 0.1 Mg  Tabs (Clonidine Hcl) .... Two Times A Day 8)  Labetalol Hcl 200 Mg  Tabs (Labetalol Hcl) .... One Two Times A Day 9)  Vitamin D 50000 Unit Caps (Ergocalciferol) .... One Weekly 10)  Flomax 0.4 Mg Xr24h-Cap (Tamsulosin Hcl) .... One Daily At Bedtime. 11)  Vit B12 Injections .... One Monthly 12)  Requip 0.5 Mg Tabs (Ropinirole Hcl) .... One At Bedtime For 5 Days and Then Two 13)  Spiriva Handihaler 18 Mcg Caps (Tiotropium Bromide Monohydrate) .... One Inh Daily 14)  Proventil Hfa 108 (90 Base) Mcg/act Aers (Albuterol Sulfate) .... Two Inh Every 6 Hours As Needed  Allergies: 1)  ! Sulf-10 2)  ! Bactrim Ds (Sulfamethoxazole-Trimethoprim) 3)  ! Morphine Sulfate (Morphine Sulfate)  Social History: Married Occupation: Actor - teams - retired, Theatre manager Current Smoker1/2 ppd Alcohol use-none Lives with wife and beagles. College education but never graduate. Children - 2 sons, 1 dgt Lives with wife and dogs  Review of Systems      See HPI  Physical Exam  General:  Well-developed, obesity ,in no acute distress; alert,appropriate and cooperative throughout examination. Obese. Lungs:  Normal respiratory effort, chest expands symmetrically. Lungs are clear to auscultation, no crackles or wheezes. Heart:  Normal rate and regular rhythm. S1 and S2 normal without gallop, murmur, click,  rub or other extra sounds. Abdomen:  Bowel sounds positive,abdomen soft and non-tender without masses, organomegaly or hernias noted. Extremities:  No clubbing, cyanosis, edema, or deformity noted with normal full range of motion of all joints.   Cervical Nodes:  No lymphadenopathy noted Psych:  Cognition and judgment appear intact. Alert and cooperative with normal attention span and concentration. No apparent delusions, illusions, hallucinations   Impression & Recommendations:  Problem # 1:  COLONIC POLYPS, HX OF (ICD-V12.72) Discussed. Reviewed path. Advised note per GI. Recheck 3 years. Agrees. Flowsheet updated.  Problem # 2:  SKIN TAG (ICD-701.9)  Seborheic lesions temples and skin tags arms. Refer derm to optomize per his request.  Orders: Dermatology Referral (Derma)  Problem # 3:  NEUROPATHY - SENSORY-MOTOR (ICD-355.9) Improved. Rx as is with Vit D and monthly B12 injections. Repeat lb in 3 months. Refer podiatry to see if remaining burning in feet related to plantar fasciits.  Problem # 4:  DYSPNEA ON EXERTION (ICD-786.09) Improved. Not open to further eval. Advised risk in not doing so - i.e. CAD, COPD etc. States with symptoms gone not open and thus will not pursue. His updated medication list for this problem includes:    Carvedilol 12.5 Mg Tabs (Carvedilol) .[name]..[name] Two times a day    Labetalol Hcl 200 Mg Tabs (Labetalol hcl) ..... One two times a day    Spiriva Handihaler 18 Mcg Caps (Tiotropium bromide monohydrate) ..... One inh daily    Proventil Hfa 108 (90 Base) Mcg/act Aers (Albuterol sulfate) .[name]..[name] Two inh every 6 hours as needed  Problem # 5:  HYPERTENSION (ICD-401.9) BP always high here. Rx as below. Get notes neprhology. Urgd home logging and recheck 3 months. ? some whitecoat HTN. His updated medication list for this problem includes:    Carvedilol 12.5 Mg Tabs (Carvedilol) .[name]..[name] Two times a day    Caduet 10-20 Mg Tabs (Amlodipine-atorvastatin) ..... One  daily    Diovan 320 Mg Tabs (Valsartan) ..... One daily    Catapres 0.1 Mg Tabs (Clonidine hcl) .[name]..[name] Two times a day    Labetalol Hcl 200 Mg Tabs (Labetalol hcl) ..... One two times a day  Problem # 6:  HYPERLIPIDEMIA (ICD-272.4) Stable. Rx as below. TLC diet, exersize and weght loss urged. His updated medication list for this problem includes:    Caduet 10-20 Mg Tabs (Amlodipine-atorvastatin) ..... One daily  Problem # 7:  TOBACCO ABUSE (ICD-305.1) Declines cessation at this time. Follow.  Complete Medication List: 1)  Adult Aspirin Ec Low Strength 81 Mg Tbec (Aspirin) .... One daily 2)  Carvedilol 12.5 Mg Tabs (Carvedilol) .... Two times a day 3)  Lexapro 10 Mg Tabs (Escitalopram oxalate) .... One daily 4)  Caduet 10-20 Mg Tabs (Amlodipine-atorvastatin) .... One daily 5)  Pantoprazole Sodium 40 Mg Tbec (Pantoprazole sodium) .... One daily 6)  Diovan 320 Mg Tabs (Valsartan) .... One daily 7)  Catapres 0.1 Mg Tabs (Clonidine hcl) .... Two times a day 8)  Labetalol Hcl 200 Mg Tabs (Labetalol hcl) .... One two times a day 9)  Vitamin D 50000 Unit Caps (Ergocalciferol) .... One weekly 10)  Flomax 0.4 Mg Xr24h-cap (Tamsulosin hcl) .... One daily at bedtime. 11)  Vit B12 Injections  .... One monthly 12)  Requip 0.5 Mg Tabs (Ropinirole hcl) .... One at bedtime for 5 days and then two 13)  Spiriva Handihaler 18 Mcg Caps (Tiotropium bromide monohydrate) .... One inh daily 14)  Proventil Hfa 108 (90 Base) Mcg/act Aers (Albuterol sulfate) .... Two inh every 6 hours as needed  Other Orders: Vit B12 1000 mcg (J3420) Admin of Therapeutic Inj  intramuscular or subcutaneous [id number]) Podiatry Referral (Podiatry)  Lipid Assessment/Plan:      Based on NCEP/ATP III, the patient's risk factor category is "2 or more risk factors and a calculated 10 year CAD risk of > 20%".  The patient's lipid goals are as follows: Total cholesterol goal is 200; LDL cholesterol goal is 100; HDL cholesterol goal is 40;  Triglyceride goal is 150.    Patient Instructions: 1)  Please schedule a follow-up appointment in 3 months.   Medication Administration  Injection # 1:    Medication: Vit B12 1000 mcg    Diagnosis: VITAMIN B12 DEFICIENCY (ICD-266.2)  Route: IM    Site: R deltoid    Exp Date: 08/01/2009    Lot #: [id number]    Mfr: [employer]    Patient tolerated injection without complications    Given by: Deidre Ala LPN (June 28, 624THL [id number] PM)  Orders Added: 1)  Vit B12 1000 mcg [J3420] 2)  Admin of Therapeutic Inj  intramuscular or subcutaneous [[id number]] 3)  Est. Patient Level IV [id number] 4)  Dermatology Referral [Derma] 5)  Podiatry Referral [Podiatry]    Preventive Care Screening  Colonoscopy:    Next Due:  06/2011   Appended Document: follow up 6 week/slj dermatology appt scheduled on 10/13/08 @9 :30am with [employer] PA.  podiatry appt scheduled on 10/14/08 @2 :45pm with Dr. Berline Lopes at [facility]. patient informed of both appts and notes faxed.LA

## 2010-05-03 NOTE — Assessment & Plan Note (Signed)
Summary: np5/htn/cp atypical/jss   Referring Provider:  Dr. Dennard Schaumann Primary Provider:  Margaretmary Eddy, MD  CC:  new patient.  HTN and chest pain atypical.  Pt states no more chest pain and believes that the chest pain he had was muscle related.  [name]  History of Present Illness: 65 yo with history of HTN, hyperlipidemia, stent graft repair of penetrating aortic ulcer, and smoking presents for cardiac evaluation.  Patient used to see a cardiologist in [place].  He was initially referred for an "enlarged heart."  This led to a heart cath in 2005 that per the patient showed no obstructive disease.  Patient recently saw Dr. Dennard Schaumann with the complaint of left-sided chest pain.  However, this occurred after he had been working hard with his arms and was reproducible with palpation of the pectoral muscle.  The chest pain was likely  musculoskeletal and has resolved.  Patient also reports exertional dyspnea for several years.  He is short of breath walking up a flight of steps or up a hill.  He has no trouble walking on flat ground.  No exertional chest pain.  No orthopnea/PND.  He continues to smoke 1/2 ppd.  BP has been elevated for years and is 186/122 today despite taking all his meds.   ECG: NSR, LVH, poor anterior R wave progression  Labs (4/11): K 3.4, creatinine 1.3  Current Medications (verified): 1)  Adult Aspirin Ec Low Strength 81 Mg  Tbec (Aspirin) .... One Daily 2)  Carvedilol 12.5 Mg Tabs (Carvedilol) .... Two Times A Day 3)  Pantoprazole Sodium 40 Mg  Tbec (Pantoprazole Sodium) .... One Daily 4)  Diovan Hct 320-12.5 Mg Tabs (Valsartan-Hydrochlorothiazide) .... Take One Tablet Once Daily 5)  Catapres 0.1 Mg  Tabs (Clonidine Hcl) .... Two Times A Day 6)  Vitamin D 50000 Unit Caps (Ergocalciferol) .... One Weekly 7)  Flomax 0.4 Mg Xr24h-Cap (Tamsulosin Hcl) .... One Daily At Bedtime. 8)  Requip 0.5 Mg Tabs (Ropinirole Hcl) .... Two Tablets At Bedtime 9)  Spiriva Handihaler 18 Mcg Caps  (Tiotropium Bromide Monohydrate) .... One Inh Daily 10)  Proventil Hfa 108 (90 Base) Mcg/act Aers (Albuterol Sulfate) .... Two Inh Every 6 Hours As Needed 11)  Cymbalta 60 Mg Cpep (Duloxetine Hcl) .... Once Daily 12)  Pravastatin Sodium 40 Mg Tabs (Pravastatin Sodium) .... Take One Tablet Once Daily  Allergies (verified): 1)  ! Sulf-10 2)  ! Bactrim Ds (Sulfamethoxazole-Trimethoprim) 3)  ! Morphine Sulfate (Morphine Sulfate)  Past History:  Past Medical History: 1. PLANTAR FASCIITIS, BILATERAL (ICD-728.71) 2. NEPHROLITHIASIS, HX OF (ICD-V13.01) 3. BACK PAIN (ICD-724.5) 4. OBESITY NOS (ICD-278.00) 5. SLEEP APNEA (ICD-780.57), patient does not tolerate CPAP 6. TOBACCO ABUSE (ICD-305.1): Normal spirometry in 3/11 7. PTSD (ICD-309.81) 8. PUD (ICD-533.90) 9. HYPERTENSION (ICD-401.9) 10. HYPERLIPIDEMIA (ICD-272.4) 11. GOUT (ICD-274.9) 12. ANXIETY (ICD-300.00) 13. BENIGN PROSTATIC HYPERTROPHY, WITH OBSTRUCTION (ICD-600.01) 14. VITAMIN B12 DEFICIENCY (ICD-266.2) 15. VITAMIN D DEFICIENCY (ICD-268.9) 16. H/O colonic polyps 17. Depression 18. Left heart cath in [place] in 2005 was negative for obstructive coronary disease per patient 19. Penetrating aortic atherosclerotic ulcer s/p stent graft repair in 6/09.   Family History: Reviewed history from 07/30/2008 and no changes required. Father: deceased, lung ca/ asbestosis/renal cancer 78 Mother: deceased  35 Alzheimers Siblings: 1 sister, hx of HTN                2 brothers, one with MI at 24.   Social History: Married, lives between [place] and [place].  Originally from [place].  Occupation: Actor - teams - retired, Librarian, academic  Current Smoker: 1/2 ppd Alcohol use-none Lives with wife and beagles. College education but never graduated. Children - 2 sons, 1 dgt  Review of Systems       All systems reviewed and negative except as per HPI.   Vital Signs:  Patient profile:   65 year old male Height:       68 inches Weight:      254 pounds BMI:     38.76 Pulse rate:   77 / minute Pulse rhythm:   regular BP sitting:   186 / 122  (left arm) Cuff size:   large  Vitals Entered By: Doug Sou CMA (September 15, 2009 2:48 PM)  Physical Exam  General:  Well developed, well nourished, in no acute distress.  Obese.  Head:  normocephalic and atraumatic Nose:  no deformity, discharge, inflammation, or lesions Mouth:  Teeth, gums and palate normal. Oral mucosa normal. Neck:  Neck thick, no JVD. No masses, thyromegaly or abnormal cervical nodes. Lungs:  Clear bilaterally to auscultation and percussion. Heart:  Non-displaced PMI, chest non-tender; regular rate and rhythm, S1, S2 without rubs or gallops. 2/6 systolic crescendo-decrescendo early systolic murmur at RUSB.  Carotid upstroke normal, no bruit. Pedals normal pulses. 1+ ankle edema.  Abdomen:  Bowel sounds positive; abdomen soft and non-tender without masses, organomegaly, or hernias noted. No hepatosplenomegaly. Msk:  Back normal, normal gait. Muscle strength and tone normal. Extremities:  No clubbing or cyanosis. Neurologic:  Alert and oriented x 3. Skin:  Intact without lesions or rashes. Psych:  Normal affect.   Impression & Recommendations:  Problem # 1:  DYSPNEA ON EXERTION (ICD-786.09) Patient has chronic dyspnea with moderate exertion.  He had recent atypical (likely musculoskeletal) chest pain.  He does not appear volume overloaded on exam though neck vein evaluation is difficult due to thick neck.  I will get an echocardiogram to assess LV systolic function and a BNP today.  GIven his multiple cardiac risk factors (HTN, hyperlipidemia, smoking) and the possibility of ischemic diastolic dysfunction as the cause of his symptoms, I will order an ETT-myoview to asesss.  He will continue ASA.  The dyspnea could simply be due to obesity and deconditioning.   Problem # 2:  TOBACCO ABUSE (ICD-305.1) I counselled the patient to quit  smoking and gave him a Chantix prescription.   Problem # 3:  HYPERTENSION (ICD-401.9) BP is quite high despite compliance with a number of meds.  I will start him on amlodipine 5 mg daily and will titrate up as needed.   Problem # 4:  HYPERLIPIDEMIA (B2193296.4) Patient recently resumed pravastatin about 3 wks  Given known vascular disease (penetrating atherosclerotic aortic ulcer), would like LDL at least < 100 and ideally < 70.   Followup in 2 wks.   Other Orders: EKG w/ Interpretation (93000) TLB-BNP (B-Natriuretic Peptide) (83880-BNPR) Echocardiogram (Echo) Nuclear Stress Test (Nuc Stress Test)  Patient Instructions: 1)  Your physician has recommended you make the following change in your medication:  2)  Start Norvasc 5mg  daily 3)  Start Chantix to help you stop smoking. 4)  Your physician has requested that you have an echocardiogram.  Echocardiography is a painless test that uses sound waves to create images of your heart. It provides your doctor with information about the size and shape of your heart and how well your heart's chambers and valves are working.  This procedure takes approximately one hour. There are no  restrictions for this procedure. 5)  Your physician has requested that you have an exercise stress myoview.  For further information please visit [email or web address].  Please follow instruction sheet, as given. 6)  Your physician recommends that you schedule a follow-up appointment in: 2-3 weeks with Dr Aundra Dubin after testing has been completed. 7)  Your physician recommends that you have lab today--BNP Prescriptions: CHANTIX CONTINUING MONTH PAK 1 MG TABS (VARENICLINE TARTRATE) take as directed  #1 x 1   Entered by:   Desiree Lucy, RN, BSN   Authorized by:   Loralie Champagne, MD   Signed by:   Desiree Lucy, RN, BSN on 09/15/2009   Method used:   Electronically to        [facility] (retail)       [address] [place], [place]   [address]       Ph: [phone]       Fax: [phone]   RxID:   [id number] CHANTIX STARTING MONTH PAK 0.5 MG X 11 & 1 MG X 42 TABS (VARENICLINE TARTRATE) take as directed  #1 x 0   Entered by:   Desiree Lucy, RN, BSN   Authorized by:   Loralie Champagne, MD   Signed by:   Desiree Lucy, RN, BSN on 09/15/2009   Method used:   Electronically to        [facility] (retail)       [address] [place], [place]  [address]       Ph: [phone]       Fax: [phone]   RxID:   [id number] NORVASC 5 MG TABS (AMLODIPINE BESYLATE) one tablet daily  #30 x 11   Entered by:   Desiree Lucy, RN, BSN   Authorized by:   Loralie Champagne, MD   Signed by:   Desiree Lucy, RN, BSN on 09/15/2009   Method used:   Electronically to        [facility] (retail)       [address] [place], [place]  [address]       Ph: [phone]       Fax: [phone]   RxID:   [id number]

## 2010-05-03 NOTE — Progress Notes (Signed)
Summary: Neph referral  Phone Note Outgoing Call   Call placed by: Druscilla Brownie,  Aug 13, 2007 10:37 AM Summary of Call: appointment set for 08/14/07 at 1030, records faxed. Patient called and wife answered and referral info given. Also adivsed of records sent to [facility] and they will contact patient with appointment date/time Initial call taken by: Druscilla Brownie,  Aug 13, 2007 10:41 AM         Appended Document: Neph referral appointment set for [facility] on 08/21/07 at 1000am, patient notified.

## 2010-05-03 NOTE — Letter (Signed)
Summary: Patient Notice, Colon Biopsy Results  [facility]  [address]   [place], [place] [address]   Phone: [phone]  Fax: [phone]       Aug 16, 2008   [name] [address] [place], [place]  [address] 1946-03-25    Dear [name],  I am pleased to inform you that the biopsies taken during your recent colonoscopy did not show any evidence of cancer upon pathologic examination. You should have a repeat colonoscopy examination  in 3 years.  Please call us if you are having persistent problems or have questions about your condition that have not been fully answered at this time.  Sincerely,    R. Garfield Cornea MD  [facility] Ph: [phone]    Fax: [phone]

## 2010-05-03 NOTE — Procedures (Signed)
Summary: [facility] appt.  [facility] appt.   Imported By: Eliezer Mccoy 07/03/2008 10:02:08  _____________________________________________________________________  External Attachment:    Type:   Image     Comment:   External Document

## 2010-05-03 NOTE — Assessment & Plan Note (Signed)
Summary: follow up 6 week/slj   Vital Signs:  Patient Profile:   65 Years Old Male Height:     68.75 inches Weight:      249 pounds BMI:     37.17 O2 Sat:      95 % Pulse rate:   74 / minute Resp:     12 per minute BP sitting:   165 / 101  Vitals Entered By: Deidre Ala (January 20, 2008 1:13 PM)                 PCP:  Weston Settle, MD  Chief Complaint:  follow up visit.  History of Present Illness: Pt in for recheck.  He has a long hx of parastehsia in his feet. He had a NCV and this showed sensory-motor nerve neuropathy. He was then referred to Dr. Brandon Melnick of Neurology in [place] and he did further eval. This is reviewed:  1. BS - 101 fasting 2. TSH normal at 1.38 3. VIt D deficiency at 15.7 4. B12  borderline low 242  Dr. Brandon Melnick called me and advised supplement for both B12 and Vit d and thus patient in to discuss.  He admits he doesnot eat much green foods and may have salad twice a month. He states has always done this and when he does eat veggies it is corn. He does not eat fruit. 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   He is on Centrum Silver and notes onloy supplement he takes.  He cont with foot symptoms. States miserable at night. Agreeable with Rx.  He now presents.    Prior Medications Reviewed Using: Patient Recall  Updated Prior Medication List: ADULT ASPIRIN EC LOW STRENGTH 81 MG  TBEC (ASPIRIN) One daily COREG CR 40 MG  CP24 (CARVEDILOL PHOSPHATE) One daily LEXAPRO 10 MG  TABS (ESCITALOPRAM OXALATE) One daily CADUET 10-10 MG TABS (AMLODIPINE-ATORVASTATIN) One daily PANTOPRAZOLE SODIUM 40 MG  TBEC (PANTOPRAZOLE SODIUM) One daily DIOVAN 320 MG  TABS (VALSARTAN) One daily CATAPRES 0.1 MG  TABS (CLONIDINE HCL) two times a day LABETALOL HCL 200 MG  TABS (LABETALOL HCL) One two times a day  Current Allergies (reviewed today): ! SULF-10  Past Medical History:    Reviewed history from 08/13/2007 and no changes required:  Current Problems:        PROTEINURIA (ICD-791.0)       ? of ABDOMINAL AORTIC ANEURYSM (ICD-441.4)       ONYCHOMYCOSIS (ICD-110.1)       PLANTAR FASCIITIS, BILATERAL (ICD-728.71)       NEPHROLITHIASIS, HX OF (ICD-V13.01)       BACK PAIN (ICD-724.5)       OBESITY NOS (ICD-278.00)       SLEEP APNEA - 4CM H2O (ICD-780.57)       TOBACCO ABUSE (ICD-305.1)       PTSD (ICD-309.81)       PUD (ICD-533.90)       HYPERTENSION (ICD-401.9)       HYPERLIPIDEMIA (ICD-272.4)       GOUT (ICD-274.9)       ANXIETY (ICD-300.00)         Past Surgical History:    Reviewed history from 10/22/2007 and no changes required:       Broken arms       Hernia repair - umbilical       Collapsed lung - right - hx of pushing xray machine and had spontaneous pneuomothorax       Kidney stones  JUne 2009 - 1. Closure of penetrating atherosclerotic ulcer infrarenal aorta with             endovascular stent graft (Endologix Powerlink bifurcated stent             graft 25 x 16 x 120, proximal cuff 25 x 25 x 75).         2. Ultrasound guided percutaneous access left common femoral artery.JUne 12    Risk Factors:  Tobacco use:  current    Year started:  77    Cigarettes:  Yes -- 1 pack(s) per day    Counseled to quit/cut down tobacco use:  yes Alcohol use:  yes  Family History Risk Factors:    Family History of MI in females < 45 years old:  no    Family History of MI in males < 109 years old:  no   Review of Systems      See HPI   Physical Exam  General:     Well-developed,well-nourished,in no acute distress; alert,appropriate and cooperative throughout examination. Obese. Lungs:     Normal respiratory effort, chest expands symmetrically. Lungs are clear to auscultation, no crackles or wheezes. Heart:     Normal rate and regular rhythm. S1 and S2 normal without gallop, murmur, click, rub or other extra sounds. Abdomen:     Right gron shows 8 cm scar with small area of opening mid point < 5  mm.Tender. No draiange. Wears briefs and friction noted. Extremities:     No clubbing, cyanosis, edema, or deformity noted with normal full range of motion of all joints.   Neurologic:     No cranial nerve deficits noted. Station and gait are normal. Plantar reflexes are down-going bilaterally. DTRs are symmetrical throughout. Sensory, motor and coordinative functions appear intact. Cervical Nodes:     No lymphadenopathy noted Psych:     Cognition and judgment appear intact. Alert and cooperative with normal attention span and concentration. No apparent delusions, illusions, hallucinations    Impression & Recommendations:  Problem # 1:  VITAMIN D DEFICIENCY (ICD-268.9) Start Ergocalciferol 50000 Units weekly. Advised risk and benefit and agreeable. Advied Dexa and will consider after discussion of osteoporosis risk. Recheck 4 weeks and optomize.  Problem # 2:  VITAMIN B12 DEFICIENCY (ICD-266.2) Poor diet. B12 borrline low and per [facility] possible contibutor to neuropathy. Start monthly B12 injections with patient refusing weekly series with onmset of Rx. Recheck 4 weeks.  Orders: Vit B12 1000 mcg (J3420) Admin of Therapeutic Inj  intramuscular or subcutaneous [id number])   Problem # 3:  PARESTHESIA (ICD-782.0) Motor sensor neuropathy. Rx as above. Advised by me and Dr. Young Berry take sevea months to see change. Agreeable. Foot care a must with comfortbale shoes a must. Agrees.  Problem # 4:  Preventive Health Care (ICD-V70.0) Update flu shot. Advised risk and benefit. VIS given. Update if concern.  Complete Medication List: 1)  Adult Aspirin Ec Low Strength 81 Mg Tbec (Aspirin) .... One daily 2)  Coreg Cr 40 Mg Cp24 (Carvedilol phosphate) .... One daily 3)  Lexapro 10 Mg Tabs (Escitalopram oxalate) .... One daily 4)  Caduet 10-10 Mg Tabs (Amlodipine-atorvastatin) .... One daily 5)  Pantoprazole Sodium 40 Mg Tbec (Pantoprazole sodium) .... One daily 6)  Diovan 320 Mg Tabs  (Valsartan) .... One daily 7)  Catapres 0.1 Mg Tabs (Clonidine hcl) .... Two times a day 8)  Labetalol Hcl 200 Mg Tabs (Labetalol hcl) .... One two times a day 9)  Vitamin D 50000 Unit Caps (Ergocalciferol) .... One weekly   Patient Instructions: 1)  Please schedule a follow-up appointment in 1 month.   Prescriptions: VITAMIN D 28413 UNIT CAPS (ERGOCALCIFEROL) One weekly  #4 x 6   Entered and Authorized by:   Weston Settle MD   Signed by:   Weston Settle MD on 01/20/2008   Method used:   Electronically to        [facility] (retail)       [address]       [place], [place]  [address]       Ph: [phone]       Fax: [phone]   RxID:   [id number] LABETALOL HCL 200 MG  TABS (LABETALOL HCL) One two times a day  #60 x 3   Entered and Authorized by:   Weston Settle MD   Signed by:   Weston Settle MD on 01/20/2008   Method used:   Electronically to        [facility] (retail)       [address] [place]       [place], [place]  [address]       Ph: [phone]       Fax: [phone]   [id number] CATAPRES 0.1 MG  TABS (CLONIDINE HCL) two times a day  #60 x 3   Entered and Authorized by:   Weston Settle MD   Signed by:   Weston Settle MD on 01/20/2008   Method used:   Electronically to        [facility] (retail)       [address]       [place], [place]  [address]       Ph: [phone]       Fax: [phone]   [id number] DIOVAN 320 MG  TABS (VALSARTAN) One daily  #30 x 3   Entered and Authorized by:   Weston Settle MD   Signed by:   Weston Settle MD on 01/20/2008   Method used:   Electronically to        [facility] (retail)       [address]       [place], [place]  [address]       Ph: [phone]       Fax: [phone]   RxID:   [id number]  ]  Preventive Care Screening  Last Flu Shot:     Date:  01/20/2008    Next Due:  02/2008    Results:  given    Medication Administration  Injection # 1:    Medication: Vit B12 1000 mcg    Diagnosis: VITAMIN B12 DEFICIENCY (ICD-266.2)    Route: IM    Site: L deltoid    Exp Date: 02/01/2009    Lot #: [id number]    Mfr: [employer]ent    Patient tolerated injection without complications    Given by: Deidre Ala (January 20, 2008 1:36 PM)  Orders Added: 1)  Vit B12 1000 mcg [J3420] 2)  Admin of Therapeutic Inj  intramuscular or subcutaneous [id number]

## 2010-05-03 NOTE — Progress Notes (Signed)
Summary: medication for  acid reflux  Phone Note Call from Patient   Summary of Call: patient called in this afternoon and states his insurance company will no longer pay for his acid medication, he couldn't tell me the name of it.  Wanted to know if he could take something different please advise. Initial call taken by: Eliezer Mccoy,  Aug 25, 2008 10:17 AM  Follow-up for Phone Call        Medication was approved by insurance co. Pt notified. Follow-up by: Deidre Ala LPN,  May 25, [id number] AM

## 2010-05-03 NOTE — Medication Information (Signed)
Summary: Prior Authorization for Protonix  Prior Authorization for Protonix   Imported By: Wyatt Mage 03/27/2007 16:13:48  _____________________________________________________________________  External Attachment:    Type:   Image     Comment:   External Document

## 2010-05-03 NOTE — Letter (Signed)
Summary: Referrals  Referrals   Imported By: Wyatt Mage LPN [id number]  _____________________________________________________________________  External Attachment:    Type:   Image     Comment:   External Document

## 2010-05-03 NOTE — Assessment & Plan Note (Signed)
Summary: Cardiology Nuclear Study  Nuclear Med Background Indications for Stress Test: Evaluation for Ischemia   History: COPD, GXT, Heart Catheterization  History Comments: '05 Cath:normal per patient; '09 Graft repair-penetrating aortic ulcer; h/o OSA  Symptoms: Chest Pressure, Chest Pressure with Exertion, DOE, Fatigue  Symptoms Comments: Last episode of CP:2 days ago.   Nuclear Pre-Procedure Cardiac Risk Factors: Family History - CAD, Hypertension, Lipids, Obesity, Smoker Caffeine/Decaff Intake: None NPO After: 12:00 AM Lungs: Clear.  O2 Sat 95% on RA. IV 0.9% NS with Angio Cath: 20g     IV Site: (R) AC IV Started by: Eliezer Lofts EMT-P Chest Size (in) 54     Height (in): 68 Weight (lb): 256 BMI: 39.07 Tech Comments: Patient has been out of 3 medications since Saturday, but doesn't know which ones.  Baseline BP 166/111 and 180/119.  Therefore, stress myoview changed to [place].  Nuclear Med Study 1 or 2 day study:  1 day     Stress Test Type:  Carlton Adam Reading MD:  Loralie Champagne, MD     Referring MD:  Loralie Champagne, MD Resting Radionuclide:  Technetium 63m Tetrofosmin     Resting Radionuclide Dose:  11 mCi  Stress Radionuclide:  Technetium 28m Tetrofosmin     Stress Radionuclide Dose:  33 mCi   Stress Protocol   Lexiscan: 0.4 mg   Stress Test Technologist:  Valetta Fuller CMA-N     Nuclear Technologist:  Charlton Amor CNMT  Rest Procedure  Myocardial perfusion imaging was performed at rest 45 minutes following the intravenous administration of Myoview Technetium 84m Tetrofosmin.  Stress Procedure  The patient was initially scheduled to walk the treadmill utilizing the Bruce protocol, but had to be changed to lexiscan due to baseline hypertension, 166/111 and 180/119.  He then received IV Lexiscan 0.4 mg over 15-seconds.  Myoview injected at 30-seconds.  There were no significant changes with infusion.  Quantitative spect images were obtained after a 45 minute  delay.  QPS Raw Data Images:  Normal; no motion artifact; normal heart/lung ratio. Stress Images:  Moderate inferior perfusion defect.  Rest Images:  Normal homogeneous uptake in all areas of the myocardium. Subtraction (SDS):  Moderate reversible inferior perfusion defect.  Transient Ischemic Dilatation:  1.06  (Normal <1.22)  Lung/Heart Ratio:  .37  (Normal <0.45)  Quantitative Gated Spect Images QGS EDV:  130 ml QGS ESV:  52 ml QGS EF:  60 % QGS cine images:  Normal wall motion   Overall Impression  Exercise Capacity: Lexiscan study BP Response: Normal blood pressure response. Clinical Symptoms: Short of breath ECG Impression: No significant ST segment change suggestive of ischemia. Overall Impression: Moderate reversible inferior perfusion defect suggestive of ischemia.   Appended Document: Cardiology Nuclear Study Inferior ischemia.  Needs followup, will need to discuss cath.  He apparently is out of several BP meds and needs refills.   Appended Document: Cardiology Nuclear Study discussed with pt and wife by telephone--they are aware Dr Aundra Dubin will discuss cath at time of followup office visit 10/06/09 with Dr Orpah Greek declined to reschedule to earlier appt date with Dr Orpah Greek states he has gotten his medication refilled

## 2010-05-03 NOTE — Progress Notes (Signed)
Summary: Nuclear pre procedure  Phone Note Outgoing Call Call back at Home Phone [phone]   Call placed by: Valetta Fuller, [place],  September 23, 2009 4:44 PM Call placed to: Patient Summary of Call: Webb Silversmith attempted to call patient with instructions for myoview, but both numbers have been disconnected.     Nuclear Med Background Indications for Stress Test: Evaluation for Ischemia   History: Heart Catheterization  History Comments: '05 Cath:normal per patient; '09 Graft repair-penetrating aortic ulcer; h/o OSA  Symptoms: Chest Pain, DOE    Nuclear Pre-Procedure Cardiac Risk Factors: Family History - CAD, Hypertension, Lipids, Obesity, Smoker Height (in): 68

## 2010-05-03 NOTE — Assessment & Plan Note (Signed)
Summary: follow up 4 week/slj   Vital Signs:  Patient Profile:   65 Years Old Male Height:     68.75 inches Weight:      258 pounds BMI:     38.52 O2 Sat:      97 % Pulse rate:   70 / minute Resp:     14 per minute BP sitting:   153 / 91  Vitals Entered By: Deidre Ala (February 20, 2008 9:22 AM)                 PCP:  Weston Settle, MD  Chief Complaint:   polyuria, fatigue, insomnia, and pt waking up 5-6 times a night.  History of Present Illness: Pt in for recheck.  He states he is tired. He is not sleeping at night and states related to peeing all the time. States Nephrologist, Dr. Lowanda Foster, told him he was relaxed on BP meds. He states he pees almost never during the day and then when night comes he wakes up every 1- 2 hours to urinate. States small amounts. He states stream does not appear weak and denies hesitancy. He denies prior hx of prostate problems and states symptoms started a few months ago. He denies hematuria and had normal BS test via Dr. Brandon Melnick recently.  He states Dr. Lowanda Foster did not adjust his BP medications. States BP was 130s/80s at bhis office and alsyws seems a littl higher here. He denies chest pain, SOB, orthopnea, PND and palpitations. Notes taking medciations daily and does not miss doses.   He has a B12 def and VIt D def. He had a shot 4 weeks ago and he takes VIT d once weekly. He states he feels better on Rx and he notes still has some numbness and tingling in feet but not nearly as bad as before.   He now presents.  Lipid Management History:      Positive NCEP/ATP III risk factors include male age 66 years old or older, HDL cholesterol less than 40, current tobacco user, and hypertension.  Negative NCEP/ATP III risk factors include non-diabetic, no family history for ischemic heart disease, no ASHD (atherosclerotic heart disease), no prior stroke/TIA, no peripheral vascular disease, and no history of aortic aneurysm.        The patient  states that he knows about the "Therapeutic Lifestyle Change" diet.  His compliance with the TLC diet is poor.  The patient expresses understanding of adjunctive measures for cholesterol lowering.  Comments: He is agreeable with repeat labs. Just laughs when asked about diet and exersize. .     Prior Medications Reviewed Using: Patient Recall  Updated Prior Medication List: ADULT ASPIRIN EC LOW STRENGTH 81 MG  TBEC (ASPIRIN) One daily COREG CR 40 MG  CP24 (CARVEDILOL PHOSPHATE) One daily LEXAPRO 10 MG  TABS (ESCITALOPRAM OXALATE) One daily CADUET 10-10 MG TABS (AMLODIPINE-ATORVASTATIN) One daily PANTOPRAZOLE SODIUM 40 MG  TBEC (PANTOPRAZOLE SODIUM) One daily DIOVAN 320 MG  TABS (VALSARTAN) One daily CATAPRES 0.1 MG  TABS (CLONIDINE HCL) two times a day LABETALOL HCL 200 MG  TABS (LABETALOL HCL) One two times a day VITAMIN D 57846 UNIT CAPS (ERGOCALCIFEROL) One weekly  Current Allergies (reviewed today): ! SULF-10  Past Medical History:    Reviewed history from 08/13/2007 and no changes required:       Current Problems:        PROTEINURIA (ICD-791.0)       ? of ABDOMINAL AORTIC ANEURYSM (ICD-441.4)  ONYCHOMYCOSIS (ICD-110.1)       PLANTAR FASCIITIS, BILATERAL (ICD-728.71)       NEPHROLITHIASIS, HX OF (ICD-V13.01)       BACK PAIN (ICD-724.5)       OBESITY NOS (ICD-278.00)       SLEEP APNEA - 4CM H2O (ICD-780.57)       TOBACCO ABUSE (ICD-305.1)       PTSD (ICD-309.81)       PUD (ICD-533.90)       HYPERTENSION (ICD-401.9)       HYPERLIPIDEMIA (ICD-272.4)       GOUT (ICD-274.9)       ANXIETY (ICD-300.00)         Past Surgical History:    Reviewed history from 10/22/2007 and no changes required:       Broken arms       Hernia repair - umbilical       Collapsed lung - right - hx of pushing xray machine and had spontaneous pneuomothorax       Kidney stones       JUne 2009 - 1. Closure of penetrating atherosclerotic ulcer infrarenal aorta with             endovascular stent  graft (Endologix Powerlink bifurcated stent             graft 25 x 16 x 120, proximal cuff 25 x 25 x 75).         2. Ultrasound guided percutaneous access left common femoral artery.JUne 12    Risk Factors:  Tobacco use:  current    Year started:  108    Cigarettes:  Yes -- 1 pack(s) per day    Counseled to quit/cut down tobacco use:  yes Alcohol use:  yes  Family History Risk Factors:    Family History of MI in females < 30 years old:  no    Family History of MI in males < 48 years old:  no   Review of Systems      See HPI   Physical Exam  General:     Well-developed,well-nourished,in no acute distress; alert,appropriate and cooperative throughout examination. Obese. Lungs:     Normal respiratory effort, chest expands symmetrically. Lungs are clear to auscultation, no crackles or wheezes. Heart:     Normal rate and regular rhythm. S1 and S2 normal without gallop, murmur, click, rub or other extra sounds. Abdomen:     Bowel sounds positive,abdomen soft and non-tender without masses, organomegaly or hernias noted. Rectal:     No external abnormalities noted. Normal sphincter tone. No rectal masses or tenderness. Prostate:     no nodules, no asymmetry, and 1+ enlarged.   Extremities:     No clubbing, cyanosis, edema, or deformity noted with normal full range of motion of all joints.   Psych:     Cognition and judgment appear intact. Alert and cooperative with normal attention span and concentration. No apparent delusions, illusions, hallucinations    Impression & Recommendations:  Problem # 1:  VITAMIN B12 DEFICIENCY (ICD-266.2) Repeat shot today and cont monthly. States has helped and foot symptoms improving. Orders: Vit B12 1000 mcg (J3420) Admin of Therapeutic Inj  intramuscular or subcutaneous [id number])   Problem # 2:  VITAMIN D DEFICIENCY (ICD-268.9) Cont Rx as is and recheck 4 weeks.  Problem # 3:  BENIGN PROSTATIC HYPERTROPHY, WITH OBSTRUCTION  (ICD-600.01) Trial Flomax.2 weeks samples given. Check PSA. He has nocturia and BS was done randomly and found to be 164. Await  fasting result and consider OGTT. Again, weight loss is a must. May try Cardura if good response to flomax and DC clonidine. Will discuss with Dr. Lowanda Foster prior to making change.  Problem # 4:  OBESITY NOS (ICD-278.00) Pt not compliants with TLC. Councelled risk and benefit of weight loss. Aware and smiles stating will try harder.  Problem # 5:  HYPERLIPIDEMIA ([id number]) Rx as below. Repeat CMP and Lipids and optomize.  His updated medication list for this problem includes:    Caduet 10-10 Mg Tabs (Amlodipine-atorvastatin) ..... One daily  Orders: T-Comprehensive Metabolic Panel ([id number]) T-Lipid Profile [phone])   Problem # 6:  HYPERTENSION (ICD-401.9) Per Dr. Lowanda Foster. Rx as per him and keep all appts as scheduled. Limit salt, lose weight and exersize daily. His updated medication list for this problem includes:    Coreg Cr 40 Mg Cp24 (Carvedilol phosphate) ..... One daily    Caduet 10-10 Mg Tabs (Amlodipine-atorvastatin) ..... One daily    Diovan 320 Mg Tabs (Valsartan) ..... One daily    Catapres 0.1 Mg Tabs (Clonidine hcl) .[name]..[name] Two times a day    Labetalol Hcl 200 Mg Tabs (Labetalol hcl) ..... One two times a day  Orders: T-Comprehensive Metabolic Panel ([id number])   Problem # 7:  Preventive Health Care (ICD-V70.0) Declines swine flu. Aware of risk and benefit.  Complete Medication List: 1)  Adult Aspirin Ec Low Strength 81 Mg Tbec (Aspirin) .... One daily 2)  Coreg Cr 40 Mg Cp24 (Carvedilol phosphate) .... One daily 3)  Lexapro 10 Mg Tabs (Escitalopram oxalate) .... One daily 4)  Caduet 10-10 Mg Tabs (Amlodipine-atorvastatin) .... One daily 5)  Pantoprazole Sodium 40 Mg Tbec (Pantoprazole sodium) .... One daily 6)  Diovan 320 Mg Tabs (Valsartan) .... One daily 7)  Catapres 0.1 Mg Tabs (Clonidine hcl) .... Two times a day 8)   Labetalol Hcl 200 Mg Tabs (Labetalol hcl) .... One two times a day 9)  Vitamin D 50000 Unit Caps (Ergocalciferol) .... One weekly 10)  Flomax 0.4 Mg Xr24h-cap (Tamsulosin hcl) .... One daily at bedtime.  Other Orders: UA Dipstick w/o Micro (manual) (81002) Capillary Blood Glucose [id number]) T-PSA  [id number])  Lipid Assessment/Plan:      Based on NCEP/ATP III, the patient's risk factor category is "2 or more risk factors and a calculated 10 year CAD risk of < 20%".  From this information, the patient's calculated lipid goals are as follows: Total cholesterol goal is 200; LDL cholesterol goal is 130; HDL cholesterol goal is 40; Triglyceride goal is 150.     Patient Instructions: 1)  Please schedule a follow-up appointment in 1 month.   Prescriptions: FLOMAX 0.4 MG XR24H-CAP (TAMSULOSIN HCL) One daily at bedtime.  #14 x 0   Entered and Authorized by:   Weston Settle MD   Signed by:   Weston Settle MD on 02/20/2008   Method used:   Samples Given   RxID:   [id number]  ] Laboratory Results   Urine Tests    Routine Urinalysis   Color: yellow Appearance: Clear Glucose: negative   (Normal Range: Negative) Bilirubin: negative   (Normal Range: Negative) Ketone: negative   (Normal Range: Negative) Spec. Gravity: 1.025   (Normal Range: 1.003-1.035) Blood: negative   (Normal Range: Negative) pH: 6.0   (Normal Range: 5.0-8.0) Protein: >=300   (Normal Range: Negative) Urobilinogen: 0.2   (Normal Range: 0-1) Nitrite: negative   (Normal Range: Negative) Leukocyte Esterace: negative   (Normal Range: Negative)  Preventive Care Screening  Last Flu Shot:    Next Due:  02/2009     Medication Administration  Injection # 1:    Medication: Vit B12 1000 mcg    Diagnosis: VITAMIN B12 DEFICIENCY (ICD-266.2)    Route: IM    Site: R deltoid    Exp Date: 05/04/2009    Lot #: [id number]    Mfr: [place]    Patient tolerated injection without complications     Given by: Deidre Ala (February 20, 2008 10:10 AM)  Orders Added: 1)  Est. Patient Level IV [id number] 2)  UA Dipstick w/o Micro (manual) [81002] 3)  Capillary Blood Glucose [82948] 4)  T-Comprehensive Metabolic Panel [id number] 5)  T-Lipid Profile [[id number]] 6)  T-PSA  [id number] 7)  Vit B12 1000 mcg [J3420] 8)  Admin of Therapeutic Inj  intramuscular or subcutaneous [id number]

## 2010-05-03 NOTE — Medication Information (Signed)
Summary: caduet   caduet   Imported By: Eliezer Mccoy 12/25/2007 08:31:49  _____________________________________________________________________  External Attachment:    Type:   Image     Comment:   External Document

## 2010-05-03 NOTE — Progress Notes (Signed)
Summary: MRA referral  Phone Note Outgoing Call   Call placed by: Druscilla Brownie,  July 17, 2007 8:21 AM Summary of Call: appointment set for renal MRA on 07/22/07 at 830/900am. Records faxed, patient notified of referral, NPO 6 hours prior and lab work this AM. voices understanding  Initial call taken by: Druscilla Brownie,  July 17, 2007 8:22 AM

## 2010-05-03 NOTE — Assessment & Plan Note (Signed)
Summary: hx of colonic polyps,consult for colonoscopy/ams   Visit Type:  Initial Consult Primary Care Provider:  Jonna Munro  Chief Complaint:  TCS/hx polyps.  History of Present Illness: Patient is a pleasant 65 y/o WM, patient of Dr. Jonna Munro, who presents for h/o colonic polyps and surveillance colonoscopy.  Patient reports h/o polyps on last TCS about 6-7 years ago when he was living in [place].  He denies constipation, diarrhea, melena, brbpr, abdominal pain.  His reflux is well-controlled.  Denies dysphagia, wt loss, n/v.     Current Medications (verified): 1)  Adult Aspirin Ec Low Strength 81 Mg  Tbec (Aspirin) .... One Daily 2)  Carvedilol 12.5 Mg Tabs (Carvedilol) .... Two Times A Day 3)  Lexapro 10 Mg  Tabs (Escitalopram Oxalate) .... One Daily 4)  Caduet 10-20 Mg Tabs (Amlodipine-Atorvastatin) .... One Daily 5)  Pantoprazole Sodium 40 Mg  Tbec (Pantoprazole Sodium) .... One Daily 6)  Diovan 320 Mg  Tabs (Valsartan) .... One Daily 7)  Catapres 0.1 Mg  Tabs (Clonidine Hcl) .... Two Times A Day 8)  Labetalol Hcl 200 Mg  Tabs (Labetalol Hcl) .... One Two Times A Day 9)  Vitamin D 50000 Unit Caps (Ergocalciferol) .... One Weekly 10)  Flomax 0.4 Mg Xr24h-Cap (Tamsulosin Hcl) .... One Daily At Bedtime. 11)  Vit B12 Injections .... One Monthly 12)  Lyrica 75 Mg Caps (Pregabalin) .... As Needed  Allergies (verified): 1)  ! Sulf-10 2)  ! Bactrim Ds (Sulfamethoxazole-Trimethoprim)  Past History:  Past Medical History:    Current Problems:     PROTEINURIA (ICD-791.0)    ? of ABDOMINAL AORTIC ANEURYSM (ICD-441.4)    ONYCHOMYCOSIS (ICD-110.1)    PLANTAR FASCIITIS, BILATERAL (ICD-728.71)    NEPHROLITHIASIS, HX OF (ICD-V13.01)    BACK PAIN (ICD-724.5)    OBESITY NOS (ICD-278.00)    SLEEP APNEA - 4CM H2O (ICD-780.57), patient does not tolerate CPAP    TOBACCO ABUSE (ICD-305.1)    PTSD (ICD-309.81)    PUD (ICD-533.90)    HYPERTENSION (ICD-401.9)    HYPERLIPIDEMIA (ICD-272.4)    GOUT (ICD-274.9)    ANXIETY (ICD-300.00)    BENIGN PROSTATIC HYPERTROPHY, WITH OBSTRUCTION (ICD-600.01)    VITAMIN B12 DEFICIENCY (ICD-266.2)    VITAMIN D DEFICIENCY (ICD-268.9)    NEUROPATHY - SENSORY-MOTOR (ICD-355.9)    H/O colonic polyps                  Past Surgical History:    1. Broken arms    2. Hernia repair - umbilical    3. Collapsed lung - right - hx of pushing xray machine and had spontaneous pneuomothorax    4. Kidney stones    5.June 2009 - 1. Closure of penetrating atherosclerotic ulcer infrarenal aorta with          endovascular stent graft (Endologix Powerlink bifurcated stent          graft 25 x 16 x 120, proximal cuff 25 x 25 x 75).     6. 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 College education but never graduate.    Children - 2 sons, 1 dgt  Review of Systems General:  Denies fever, chills, fatigue, weakness, and weight loss. Eyes:  Denies vision loss. ENT:  Denies nasal congestion, sore throat, hoarseness, and difficulty swallowing. CV:  Complains of dyspnea on exertion; denies chest pains, angina, orthopnea, PND, and peripheral edema. Resp:  Complains of dyspnea with exercise; denies dyspnea at rest and cough. GI:  See HPI. GU:  Complains of urinary hesitancy; denies urinary burning and blood in urine. MS:  Denies joint pain / LOM. Derm:  Denies rash. Neuro:  Denies weakness, frequent headaches, memory loss, and  confusion. Psych:  Denies depression and anxiety. Endo:  Denies unusual weight change. Heme:  Denies bruising and bleeding. Allergy:  Denies hives, rash, and recurrent infections.  Vital Signs:  Patient profile:   65 year old male Height:      68 inches Weight:      264 pounds BMI:     40.29 Temp:     98.1 degrees F oral Pulse rate:   72 / minute BP sitting:   142 / 98  (left arm) Cuff size:   large  Physical Exam  General:  Well developed, well nourished, no acute distress. Head:  Normocephalic and atraumatic. Eyes:  Conjunctivae pink, no scleral icterus.  Mouth:  Oropharyngeal mucosa moist, pink.  No lesions, erythema or exudate.    Neck:  Supple; no masses or thyromegaly. Lungs:  Clear throughout to auscultation. Heart:  Regular rate and rhythm; no murmurs, rubs,  or bruits. Abdomen:  Obese.  Bowel sounds normal.  Abdomen is soft, nontender, nondistended.  No rebound or guarding.  No hepatosplenomegaly, masses or hernias.  No abdominal bruits.  Rectal:  deferred until time of colonoscopy.   Extremities:  No clubbing, cyanosis, edema or deformities noted. Neurologic:  Alert and  oriented x4;  grossly normal neurologically. Skin:  Intact without significant lesions or rashes. Cervical Nodes:  No significant cervical adenopathy. Psych:  Alert and cooperative. Normal mood and affect.  Impression & Recommendations:  Problem # 1:  COLONIC POLYPS, HX OF (ICD-V12.72)  H/O polyps, unknown if adenomas.  Recommend TCS.  Colonoscopy to be performed in near future.  Risks, alternatives, and benefits including but not limited to the risk of reaction to medication, bleeding, infection, and perforation were addressed.  Patient voiced understanding and provided verbal consent. Patient is worried about waking up in middle of procedure (he remembers polypectomy previously).  He was reassured that TCS would not interfere with prior abdominal aortic graft/stent.  Orders: Consultation Level IV  [id number])  Problem # 2:  GERD (ICD-530.81)  Well-controlled on pantoprazole.  Orders: Consultation Level IV [id number])   I would like to thank Dr. Jonna Munro for allowing [place] to take part in the care of this patient.

## 2010-05-03 NOTE — Assessment & Plan Note (Signed)
Summary: appt @ 2:00/eph/jml   Referring Provider:  Dr. Dennard Schaumann Primary Provider:  Margaretmary Eddy, MD   History of Present Illness: 65 yo with history of HTN, hyperlipidemia, stent graft repair of penetrating aortic ulcer, and smoking returns for cardiology followup.  Due to his exertional dyspnea and occasional chest pain, I had him do a Lexiscan myoview in 6/11.  This was concerning for moderate inferior ischemia.  He ended up having a left heart cath which showed minimal nonobstructive CAD.  He also had an echo showing normal EF with mild LV hypertrophy.  He has been doing fairly well recently.  He has been trying to exercise more and is walking 1.5 miles/night without significant dyspnea.  He does feel fatigued, especially during the afternoon.  Of note, he has CPAP but has not been able to tolerate it to use regularly.  He continues to have rare atypical chest pain that is nonexertional and that only last for a second or two. He continues to smoke and BP is still running high.   ECG: NSR, LAFB, LVH  Labs (4/11): K 3.4, creatinine 1.3 Labs (6/11): BNP 34  Current Medications (verified): 1)  Adult Aspirin Ec Low Strength 81 Mg  Tbec (Aspirin) .... One Daily 2)  Coreg 25 Mg Tabs (Carvedilol) .... One Twice A Day 3)  Pantoprazole Sodium 40 Mg  Tbec (Pantoprazole Sodium) .... One Daily 4)  Diovan Hct 160-12.5 Mg Tabs (Valsartan-Hydrochlorothiazide) .[name].. 1 By Mouth Daily 5)  Catapres 0.1 Mg  Tabs (Clonidine Hcl) .... Two Times A Day 6)  Vitamin D 50000 Unit Caps (Ergocalciferol) .... One Weekly 7)  Flomax 0.4 Mg Xr24h-Cap (Tamsulosin Hcl) .... One Daily At Bedtime. 8)  Requip 0.5 Mg Tabs (Ropinirole Hcl) .... Two Tablets At Bedtime 9)  Cymbalta 60 Mg Cpep (Duloxetine Hcl) .... Once Daily 10)  Pravastatin Sodium 40 Mg Tabs (Pravastatin Sodium) .... Take One Tablet Once Daily 11)  Norvasc 10 Mg Tabs (Amlodipine Besylate) .[name].. 1 By Mouth Dialy 12)  Klor-Con M20 20 Meq Cr-Tabs (Potassium Chloride  Crys Cr) .... Two in The Morning and One in The Evening 13)  Nicoderm Cq 21 Mg/24hr Pt24 (Nicotine) .... Use Daily As Directed To Help You Stop Smoking 14)  Spironolactone 25 Mg Tabs (Spironolactone) .... One-Half Tablet Daily  Allergies (verified): 1)  ! Sulf-10 2)  ! Bactrim Ds (Sulfamethoxazole-Trimethoprim) 3)  ! Morphine Sulfate (Morphine Sulfate)  Past History:  Past Medical History: 1. PLANTAR FASCIITIS, BILATERAL (ICD-728.71) 2. NEPHROLITHIASIS, HX OF (ICD-V13.01) 3. BACK PAIN (ICD-724.5) 4. OBESITY NOS (ICD-278.00) 5. SLEEP APNEA (ICD-780.57), patient does not tolerate CPAP 6. TOBACCO ABUSE (ICD-305.1): Normal spirometry in 3/11 7. PTSD (ICD-309.81) 8. PUD (ICD-533.90) 9. HYPERTENSION (ICD-401.9) 10. HYPERLIPIDEMIA (ICD-272.4) 11. GOUT (ICD-274.9) 12. ANXIETY (ICD-300.00) 13. BENIGN PROSTATIC HYPERTROPHY, WITH OBSTRUCTION (ICD-600.01) 14. VITAMIN B12 DEFICIENCY (ICD-266.2) 15. VITAMIN D DEFICIENCY (ICD-268.9) 16. H/O colonic polyps 17. Depression 18. Left heart cath in [place] in 2005 was negative for obstructive coronary disease per patient. Lexiscan myoview (6/11) showed possible moderate inferior ischemia.  Left heart cath (7/11) showed minimal nonobstructive coronary disease.  81. Penetrating aortic atherosclerotic ulcer s/p stent graft repair in 6/09.  20. Echo (6/11): EF 60-65%, mild LV hypertrophy, mild diastolic dysfunction, normal RV, mild aortic insufficiency.  21.  CKD  Family History: Reviewed history from 09/15/2009 and no changes required. Father: deceased, lung ca/ asbestosis/renal cancer 15 Mother: deceased  44 Alzheimers Siblings: 1 sister, hx of HTN  2 brothers, one with MI at 19.   Social History: Reviewed history from 09/15/2009 and no changes required. Married, lives between [place] and [place].  Originally from [place].  Occupation: Actor - teams - retired, Librarian, academic  Current Smoker: 1/2 ppd Alcohol  use-none Lives with wife and beagles. College education but never graduated. Children - 2 sons, 1 dgt  Review of Systems       All systems reviewed and negative except as per HPI.   Vital Signs:  Patient profile:   65 year old male Height:      68 inches Weight:      254 pounds BMI:     38.76 Pulse rate:   84 / minute Resp:     18 per minute BP sitting:   140 / 108  (left arm)  Vitals Entered By: Levora Angel, CNA (October 26, 2009 1:55 PM)  Physical Exam  General:  Well developed, well nourished, in no acute distress.  Obese.  Neck:  Neck thick, no JVD. No masses, thyromegaly or abnormal cervical nodes. Lungs:  Clear bilaterally to auscultation and percussion. Heart:  Non-displaced PMI, chest non-tender; regular rate and rhythm, S1, S2 without rubs or gallops. 1/6 systolic crescendo-decrescendo early systolic murmur at RUSB.  Carotid upstroke normal, no bruit. Pedals normal pulses. Trace ankle edema.  Abdomen:  Bowel sounds positive; abdomen soft and non-tender without masses, organomegaly, or hernias noted. No hepatosplenomegaly. Extremities:  No clubbing or cyanosis. Neurologic:  Alert and oriented x 3. Psych:  Normal affect.   Impression & Recommendations:  Problem # 1:  HYPERTENSION (ICD-401.9) BP is still running very high at times.  I will increase his Coreg to 25 mg two times a day.  Next step will be to add spironolactone if he needs an additional agent.  We will call him in 2 weeks to see what his BP is running.  We will get renal arterial dopplers to assess for renal artery stenosis.  I also encouraged him to lose weight and try to use his CPAP.  He does not want to go back to pulmonlogy for a re-fitting.  Followup in 1 month in the office.   Problem # 2:  TOBACCO ABUSE (ICD-305.1) I strongly counselled him to quit smoking.   Problem # 3:  CHEST PAIN-UNSPECIFIED (ICD-786.50) Atypical chest pain and exertional dyspnea do not seem to be related to coronary diseae.   Nonobstructive cath.   Other Orders: Renal Artery Duplex (Renal Artery Duplex) TLB-BMP (Basic Metabolic Panel-BMET) (99991111)  Patient Instructions: 1)  Your physician has recommended you make the following change in your medication:  2)  Increase Coreg(carvediolol) 25mg  twice a day. 3)  Use a Nicoderm patch 21mg   to help you stop smoking--you can buy this without a prescription. 4)  Lab today--BMP 401.9  5)  Your physician has requested that you have a renal artery duplex. During this test, an ultrasound is used to evaluate blood flow to the kidneys. Allow one hour for this exam. Do not eat after midnight the day before and avoid carbonated beverages. Take your medications as you usually do. 6)  Your physician has requested that you regularly monitor and record your blood pressure readings at home.  Please use the same machine at the same time of day to check your readings. I will call you in 2 weeks to get the readings. [name] [phone]  7)  Your physician recommends that you schedule a follow-up appointment in: 1 month with  Dr Aundra Dubin. Prescriptions: SPIRONOLACTONE 25 MG TABS (SPIRONOLACTONE) one-half tablet daily  #15 x 11   Entered by:   Desiree Lucy, RN, BSN   Authorized by:   Loralie Champagne, MD   Signed by:   Desiree Lucy, RN, BSN on 10/27/2009   Method used:   Electronically to        [facility] (retail)       [address] [place], [place]  [address]       Ph: [phone]       Fax: [phone]   RxID:   [id number] KLOR-CON M20 20 MEQ CR-TABS (POTASSIUM CHLORIDE CRYS CR) two in the morning and one in the evening  #90 x 11   Entered by:   Desiree Lucy, RN, BSN   Authorized by:   Loralie Champagne, MD   Signed by:   Desiree Lucy, RN, BSN on 10/27/2009   Method used:   Electronically to        [facility] (retail)       [address] [place], [place]  [address]       Ph:  [phone]       Fax: [phone]   RxID:   [id number] COREG 25 MG TABS (CARVEDILOL) one twice a day  #60 x 11   Entered by:   Desiree Lucy, RN, BSN   Authorized by:   Loralie Champagne, MD   Signed by:   Desiree Lucy, RN, BSN on 10/27/2009   Method used:   Electronically to        [facility] (retail)       [address] [place], [place]  [address]       Ph: [phone]       Fax: [phone]   RxID:   [id number]

## 2010-05-03 NOTE — Assessment & Plan Note (Signed)
Summary: FEET PROBLEMS/RRS   Vital Signs:  Patient Profile:   65 Years Old Male Height:     68.75 inches Weight:      248 pounds BMI:     37.02 O2 Sat:      97 % Temp:     97.4 degrees F Pulse rate:   85 / minute Resp:     14 per minute BP sitting:   173 / 120  Vitals Entered By: Deidre Ala (July 16, 2007 3:01 PM)                 PCP:  Weston Settle, MD  Chief Complaint:  foot pain.  History of Present Illness: Pt in for acute visit.  Has not been seen since Nov 2008.  He has left ankle pain. He rates pain as 5/10. Worse with walking and better with rest. He has continued on Allopurinol and has used two aleves and this does not help. He states ankle red and swollen and he gets gout here or in right foot big toe.  States usually gets better with time. He also drinks concentrated berry juice and this helps. He denies ETOH use and eats occasional red meat.\par  He has right foot pain. He states he tried to kick rock and missed it. He has had sharp pain over dorsum foot. Curious if he sprained something.  He states he started Lamisil. He has culture proven toe fungus. Has used and only took one months worth. Curious if he should retart Rx.  He is worried about DM. States thristy and cannot get enough water. He pees a lot and goes 7 times a night. Weight down 3 pounds since last visit. He has no family hx of DM but is concerned.  Now presents.  Hypertension History:      He denies headache, chest pain, palpitations, dyspnea with exertion, orthopnea, PND, peripheral edema, visual symptoms, neurologic problems, syncope, and side effects from treatment.  He notes no problems with any antihypertensive medication side effects.  Further comments include: He has a hx of reanl artery in the past. Was done in [place] and states was normal. He is not sure about 24 hr urines but agrees to have tests done. No sx. [name]        Positive major cardiovascular risk factors include male age  35 years old or older, hyperlipidemia, hypertension, and current tobacco user.  Negative major cardiovascular risk factors include no history of diabetes and negative family history for ischemic heart disease.        Further assessment for target organ damage reveals no history of ASHD, stroke/TIA, or peripheral vascular disease.       Prior Medications Reviewed Using: Medication Bottles  Updated Prior Medication List: ADULT ASPIRIN EC LOW STRENGTH 81 MG  TBEC (ASPIRIN) One daily COREG CR 40 MG  CP24 (CARVEDILOL PHOSPHATE) One daily ALLOPURINOL 100 MG  TABS (ALLOPURINOL) One daily LEXAPRO 10 MG  TABS (ESCITALOPRAM OXALATE) One daily CADUET 10-20 MG  TABS (AMLODIPINE-ATORVASTATIN) One daily PANTOPRAZOLE SODIUM 40 MG  TBEC (PANTOPRAZOLE SODIUM) One daily DIOVAN 320 MG  TABS (VALSARTAN) One daily  Current Allergies (reviewed today): ! SULF-10 (SULFACETAMIDE SODIUM)  Past Medical History:    Reviewed history from 01/31/2007 and no changes required:       Anxiety       Gout       Hyperlipidemia       Hypertension  Past Surgical History:    Reviewed history from 01/31/2007  and no changes required:       Broken arms       Hernia repair - umbilical       Collapsed lung - right - hx of pushing xray machine and had spontaneous pneuomothorax       Kidney stones   Family History:    Reviewed history from 01/31/2007 and no changes required:       Father: deceased, lung ca/ asbestos 44       Mother: deceased  82 Alzheimers       Siblings: 1 sister 54, hx unknown                      2 brothers, 36, 53, HTN, hrt problems, renal cancer  Social History:    Reviewed history from 01/31/2007 and no changes required:       Married       Occupation: Theatre manager       Current Smoker       Alcohol use-yes   Risk Factors:  Tobacco use:  current    Year started:  27    Cigarettes:  Yes -- 1 pack(s) per day    Counseled to quit/cut down tobacco use:  yes Alcohol use:   yes  Family History Risk Factors:    Family History of MI in females < 68 years old:  no    Family History of MI in males < 79 years old:  no   Review of Systems      See HPI  General      Denies chills, fever, and sweats.  CV      Denies bluish discoloration of lips or nails, swelling of feet, swelling of hands, and weight gain.  Resp      Denies chest discomfort, cough, shortness of breath, sputum productive, and wheezing.  GI      Denies abdominal pain, constipation, diarrhea, nausea, and vomiting.  GU      Denies nocturia, urinary frequency, and urinary hesitancy.   Physical Exam  General:     Well-developed,well-nourished,in no acute distress; alert,appropriate and cooperative throughout examination. Obese. Lungs:     Normal respiratory effort, chest expands symmetrically. Lungs are clear to auscultation, no crackles or wheezes. Heart:     Normal rate and regular rhythm. S1 and S2 normal without gallop, murmur, click, rub or other extra sounds. Abdomen:     Bowel sounds positive,abdomen soft and non-tender without masses, organomegaly or hernias noted. Extremities:     No clubbing, cyanosis, edema, or deformity noted with normal full range of motion of all joints.  Left ankle swollen and tender of ROM. MIldy warm. Thick yellow toenails. Neurologic:     No cranial nerve deficits noted. Station and gait are normal. Plantar reflexes are down-going bilaterally. DTRs are symmetrical throughout. Sensory, motor and coordinative functions appear intact.Mild paraspinous muscle spasm low back. Neg SLT. Gait normal Cervical Nodes:     No lymphadenopathy noted Psych:     Cognition and judgment appear intact. Alert and cooperative with normal attention span and concentration. No apparent delusions, illusions, hallucinations    Impression & Recommendations:  Problem # 1:  GOUT (ICD-274.9) Acute flare. DC Allopurinol and educated to do when sx excerbated. Start medrol pack and  colchicine and recheck here in 2 weeks - sooner if sx worsen. Advised need for red meat limitation, watching ETOH, Check uric acid level and optomize. May resume Allopurinol once acute flare resolved. His  updated medication list for this problem includes:    Allopurinol 100 Mg Tabs (Allopurinol) ..... One daily    Colchicine 0.6 Mg Tabs (Colchicine) ..... One two times a day  Orders: T-Uric Acid [id number])   Problem # 2:  ONYCHOMYCOSIS (ICD-110.1) Resume Lamisil and use 12 weeks. If no resolve, no further Rx and something he will have to live with. Agrees. Councelled need to complete ciurse given risk for resistance. Aware and agreeable. His updated medication list for this problem includes:    Lamisil 250 Mg Tabs (Terbinafine hcl) ..... One daily   Problem # 3:  HYPERTENSION (ICD-401.9) Poorly conmtrolled. Add Clonidine. Refer renela MRA and if nomal, do 24 hour urine studies to eval for secondary causes such as pheo. He is completely asymptomatic and advised hence the reason we call HTN the silent killer. States takes meds daily. Encouraged low salt intake, eye exam. Await study and optomize. Check labs as below and recheck here in 2 weeks. Advised risk and benefit of Catapress. Agrees. Check BS with sx of DM. Await result. Encouraged diet, exersize and weight loss. His updated medication list for this problem includes:    Coreg Cr 40 Mg Cp24 (Carvedilol phosphate) ..... One daily    Caduet 10-20 Mg Tabs (Amlodipine-atorvastatin) ..... One daily    Diovan 320 Mg Tabs (Valsartan) ..... One daily    Catapres 0.1 Mg Tabs (Clonidine hcl) .[name]..[name] Two times a day  Orders: T-Comprehensive Metabolic Panel (A999333) T-CBC w/Diff [id number]) T-TSH [phone]) Radiology Referral (Radiology)   Problem # 4:  HYPERLIPIDEMIA (ICD-272.4) Check lab and optomize. His updated medication list for this problem includes:    Caduet 10-20 Mg Tabs (Amlodipine-atorvastatin) ..... One  daily  Orders: T-Comprehensive Metabolic Panel ([id number]) T-Lipid Profile [id number])   Problem # 5:  Preventive Health Care (ICD-V70.0) Needs optomization near future and will address once acute flare of gout improved as well as HTN optomize. Agrees. Needs colonoscopy update. PSA up to date per MD in [facility].  See records.  Complete Medication List: 1)  Adult Aspirin Ec Low Strength 81 Mg Tbec (Aspirin) .... One daily 2)  Coreg Cr 40 Mg Cp24 (Carvedilol phosphate) .... One daily 3)  Allopurinol 100 Mg Tabs (Allopurinol) .... One daily 4)  Lexapro 10 Mg Tabs (Escitalopram oxalate) .... One daily 5)  Caduet 10-20 Mg Tabs (Amlodipine-atorvastatin) .... One daily 6)  Pantoprazole Sodium 40 Mg Tbec (Pantoprazole sodium) .... One daily 7)  Diovan 320 Mg Tabs (Valsartan) .... One daily 8)  Medrol (pak) 4 Mg Tabs (Methylprednisolone) .... As directed 9)  Lamisil 250 Mg Tabs (Terbinafine hcl) .... One daily 10)  Colchicine 0.6 Mg Tabs (Colchicine) .... One two times a day 11)  Catapres 0.1 Mg Tabs (Clonidine hcl) .... Two times a day  Hypertension Assessment/Plan:      The patient's hypertensive risk group is category B: At least one risk factor (excluding diabetes) with no target organ damage.  Today's blood pressure is 173/120.  His blood pressure goal is < 140/90.   Patient Instructions: 1)  Please schedule a follow-up appointment in 2 weeks.    Prescriptions: CATAPRES 0.1 MG  TABS (CLONIDINE HCL) two times a day  #60 x 0   Entered and Authorized by:   Weston Settle MD   Signed by:   Weston Settle MD on 07/16/2007   Method used:   Print then Give to Patient   RxID:   [id number] COLCHICINE 0.6 MG  TABS (COLCHICINE) One two times a  day  #60 x 0   Entered and Authorized by:   Weston Settle MD   Signed by:   Weston Settle MD on 07/16/2007   Method used:   Print then Give to Patient   RxID:   [id number] LAMISIL 250 MG  TABS (TERBINAFINE HCL) One  daily  #30 x 2   Entered and Authorized by:   Weston Settle MD   Signed by:   Weston Settle MD on 07/16/2007   Method used:   Print then Give to Patient   RxID:   [id number] MEDROL (PAK) 4 MG  TABS (METHYLPREDNISOLONE) As directed  #1 x 0   Entered and Authorized by:   Weston Settle MD   Signed by:   Weston Settle MD on 07/16/2007   Method used:   Print then Give to Patient   RxID:   [id number]  ]  Preventive Care Screening  PSA:    Date:  09/07/2006    Next Due:  09/2007    Results:  1.60 ng/mL

## 2010-05-03 NOTE — Medication Information (Signed)
Summary: PA Paperwork for [employer]  PA Paperwork for [employer]   Imported By: Wyatt Mage 08/01/2007 14:50:18  _____________________________________________________________________  External Attachment:    Type:   Image     Comment:   External Document

## 2010-05-03 NOTE — Assessment & Plan Note (Signed)
Summary: physical/arc   Vital Signs:  Patient profile:   65 year old male Height:      68.75 inches Weight:      264 pounds BMI:     39.41 O2 Sat:      98 % Temp:     98.4 degrees F Pulse rate:   81 / minute Resp:     14 per minute BP sitting:   158 / 112  Vitals Entered By: Deidre Ala LPN (March 31, 624THL [id number] AM)  Nutrition Counseling: Patient's BMI is greater than 25 and therefore counseled on weight management options.  Primary Provider:  Weston Settle, MD  CC:  Recheck.  History of Present Illness: Pt in for recheck.  He had recent labs and this is reviewed:  1. CBC - normal 2. CMP - BS 102. He denies polyruia, polyphagia and polydispia. He denies family hx of DM. He is walking for exersize. He is watching diet and started this after brother left after last visit. He is eating veggies, baked foods. Avoidig fried foods and salt. He notes weight steady. 3. Lipids -  see below. 4. VT D normal 5. Vit B normal 6. TSH - normal  He states his legs still hurt. He states can hardly stand it at night. 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 He states pain is aching. Worse with standing and better resting and propping them up. He denies numbness an tingling. He states he has no cramping in calves and states just hurts. He had normal ABIs and was sent for NCV and this showed sensory-motor polyneuropathy. He was told to start Vit B12 and Vit D per Dr. Brandon Melnick and it has not helped much - maybe little. He is curious about Lyrica and Neurontin as wife noted may be an option.  He is morbidly obese. He has sleep apnea and has CPAP at home. He refuses to use this and states mask scares him. He does not use oxygen. He states uses [employer] and got new nasal prongs and notes not using. He states may be willing to use this.  He now presents.  Hypertension History:      He complains of dyspnea with exertion, but  denies headache, chest pain, palpitations, orthopnea, PND, peripheral edema, visual symptoms, neurologic problems, syncope, and side effects from treatment.  He notes no problems with any antihypertensive medication side effects.  Further comments include: He cont to see Dr.Befekadu for HTN. He states always good there. States nervous here and BP always high. He will do home reads and buy a machine. States winded on exertion but baseline.        Positive major cardiovascular risk factors include male age 46 years old or older, hyperlipidemia, hypertension, and current tobacco user.  Negative major cardiovascular risk factors include no history of diabetes and negative family history for ischemic heart disease.        Further assessment for target organ damage reveals no history of ASHD, stroke/TIA, or peripheral vascular disease.    Lipid Management History:      Positive NCEP/ATP III risk factors include male age 42 years old or older, HDL cholesterol less than 40, current tobacco user, and hypertension.  Negative NCEP/ATP III risk factors include non-diabetic, no family history for ischemic heart disease, no ASHD (atherosclerotic heart disease), no prior stroke/TIA, no peripheral vascular disease, and no history of aortic aneurysm.  The patient states that he does not know about the "Therapeutic Lifestyle Change" diet.  His compliance with the TLC diet is fair.  The patient expresses understanding of adjunctive measures for cholesterol lowering.  Adjunctive measures started by the patient include aerobic exercise, fiber, and ASA.  He expresses no side effects from his lipid-lowering medication.  The patient denies any symptoms to suggest myopathy or liver disease.  Comments: See HPI.    Preventive Screening-Counseling & Management     Alcohol drinks/day: 0     Smoking Status: current     Smoking Cessation Counseling: yes     Smoke Cessation Stage: precontemplative     Packs/Day: 0.75     Year  Started: Age  51  Current Problems (verified): 1)  Benign Prostatic Hypertrophy, With Obstruction  (ICD-600.01) 2)  Vitamin B12 Deficiency  (ICD-266.2) 3)  Vitamin D Deficiency  (ICD-268.9) 4)  Neuropathy - Sensory-motor  (ICD-355.9) 5)  Proteinuria  (ICD-791.0) 6)  Plantar Fasciitis, Bilateral  (ICD-728.71) 7)  Nephrolithiasis, Hx of  (ICD-V13.01) 8)  Back Pain  (ICD-724.5) 9)  Obesity Nos  (ICD-278.00) 10)  Sleep Apnea - 4cm H2o  (ICD-780.57) 11)  Tobacco Abuse  (ICD-305.1) 12)  Ptsd  (ICD-309.81) 13)  Pud  (ICD-533.90) 14)  Hypertension  (ICD-401.9) 15)  Hyperlipidemia  (ICD-272.4) 16)  Gout  (ICD-274.9) 17)  Anxiety  (ICD-300.00)  Current Medications (verified): 1)  Adult Aspirin Ec Low Strength 81 Mg  Tbec (Aspirin) .... One Daily 2)  Carvedilol 12.5 Mg Tabs (Carvedilol) .... Two Times A Day 3)  Lexapro 10 Mg  Tabs (Escitalopram Oxalate) .... One Daily 4)  Caduet 10-20 Mg Tabs (Amlodipine-Atorvastatin) .... One Daily 5)  Pantoprazole Sodium 40 Mg  Tbec (Pantoprazole Sodium) .... One Daily 6)  Diovan 320 Mg  Tabs (Valsartan) .... One Daily 7)  Catapres 0.1 Mg  Tabs (Clonidine Hcl) .... Two Times A Day 8)  Labetalol Hcl 200 Mg  Tabs (Labetalol Hcl) .... One Two Times A Day 9)  Vitamin D 50000 Unit Caps (Ergocalciferol) .... One Weekly 10)  Flomax 0.4 Mg Xr24h-Cap (Tamsulosin Hcl) .... One Daily At Bedtime. 11)  Vit B12 Injections .... One Monthly  Allergies (verified): 1)  ! Sulf-10 2)  ! Bactrim Ds (Sulfamethoxazole-Trimethoprim)  Past History:  Past Surgical History:    1. Broken arms    2. Hernia repair - umbilical    3. Collapsed lung - right - hx of pushing xray machine and had spontaneous pneuomothorax    4. Kidney stones    5.JUne 2009 - 1. Closure of penetrating atherosclerotic ulcer infrarenal aorta with          endovascular stent graft (Endologix Powerlink bifurcated stent          graft 25 x 16 x 120, proximal cuff 25 x 25 x 75).     6. 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 College education but never graduate.    Packs/Day:  0.75  Review of Systems      See HPI General:  Denies chills, fever, and sweats. Resp:  Denies cough, shortness of breath, sputum productive, and wheezing. GI:  Denies abdominal pain, constipation, diarrhea, nausea, and vomiting. GU:  Denies nocturia, urinary frequency, and urinary hesitancy; Flomax doing great for urination. Now down to once a night. Not open to urology eval.. Psych:  Denies anxiety and depression.  Physical Exam  General:  Well-developed, obesity ,in no acute distress; alert,appropriate and cooperative throughout examination. Obese. Lungs:  Normal respiratory effort, chest expands symmetrically. Lungs are clear to auscultation, no crackles or wheezes. Heart:  Normal rate and regular rhythm. S1 and S2 normal without gallop, murmur, click, rub or other extra sounds. Abdomen:  Bowel sounds positive,abdomen soft and non-tender without masses, organomegaly or hernias noted. Extremities:  No clubbing, cyanosis, edema, or deformity noted with normal full range of motion of all joints.   Cervical Nodes:  No lymphadenopathy noted Psych:  Cognition and judgment appear intact. Alert and cooperative with normal attention span and concentration. No apparent delusions, illusions, hallucinations   Impression & Recommendations:  Problem # 1:  BENIGN PROSTATIC HYPERTROPHY, WITH OBSTRUCTION  (ICD-600.01) Improved per his report. Flomax as directed. Follow and update if worsen.  Problem # 2:  VITAMIN B12 DEFICIENCY (ICD-266.2) To goal. Rx as is with monthly injections.  Update  monthly shot in one week.  Problem # 3:  VITAMIN D DEFICIENCY (ICD-268.9) Resolved. Rx as is for three months, then taper dose.  Problem # 4:  NEUROPATHY - SENSORY-MOTOR (ICD-355.9) Trial Lyrica. Advised risk and benefit. If persist, refer back to Neurology. Councelled comfortable shoes, limit long periods of standing etc.  Problem # 5:  OBESITY NOS (ICD-278.00) TLC diet, portion control and 30 minuts of exersize daly advised. Aware of health risk.  Problem # 6:  TOBACCO ABUSE (ICD-305.1) Again councelled. Not open to quitting. Aware of risk. Follow.  Problem # 7:  HYPERTENSION (ICD-401.9) BP very high. Admits at end of visit not taking Rx daily. Urged to do so. Get home monitor. Log weekly for 4 weeks. See Dr. Lowanda Foster in 2 months as set. Councelled CVA, CAD and death risk. Limit salt. Lose weight. His updated medication list for this problem includes:    Carvedilol 12.5 Mg Tabs (Carvedilol) .[name]..[name] Two times a day    Caduet 10-20 Mg Tabs (Amlodipine-atorvastatin) ..... One daily    Diovan 320 Mg Tabs (Valsartan) ..... One daily    Catapres 0.1 Mg Tabs (Clonidine hcl) .[name]..[name] Two times a day    Labetalol Hcl 200 Mg Tabs (Labetalol hcl) ..... One two times a day  Problem # 8:  HYPERLIPIDEMIA (ICD-272.4) Stable. Rx as below. Exersize and high fiber diet to get HDL > 40. His updated medication list for this problem includes:    Caduet 10-20 Mg Tabs (Amlodipine-atorvastatin) ..... One daily  Problem # 9:  SLEEP APNEA - 4CM H2O (ICD-780.57) Not using Rx. Councelled risk and benefit of this. Recheck 4 weeks. If still not compliant, considerovernight oxymetry to rule out hypoxemia and optomize.  Problem # 10:  Preventive Health Care (ICD-V70.0) Hx of polyps in 2006 - last done [place]. Not sure of tupe  of polyps. Not sure when due for recheck. Agree with GI referal.   Complete  Medication List: 1)  Adult Aspirin Ec Low Strength 81 Mg Tbec (Aspirin) .... One daily 2)  Carvedilol 12.5 Mg Tabs (Carvedilol) .... Two times a day 3)  Lexapro 10 Mg Tabs (Escitalopram oxalate) .... One daily 4)  Caduet 10-20 Mg Tabs (Amlodipine-atorvastatin) .... One daily 5)  Pantoprazole Sodium 40 Mg Tbec (Pantoprazole sodium) .... One daily 6)  Diovan 320 Mg Tabs (Valsartan) .... One daily 7)  Catapres 0.1 Mg Tabs (Clonidine hcl) .... Two times a day 8)  Labetalol Hcl 200 Mg Tabs (Labetalol hcl) .... One two times a day 9)  Vitamin D 50000 Unit Caps (Ergocalciferol) .... One weekly 10)  Flomax 0.4 Mg Xr24h-cap (Tamsulosin hcl) .... One daily at bedtime. 11)  Vit B12 Injections  .... One monthly 12)  Lyrica 75 Mg Caps (Pregabalin) .... Two times a day  Other Orders: Gastroenterology Referral (GI)  Hypertension Assessment/Plan:      The patient's hypertensive risk group is category B: At least one risk factor (excluding diabetes) with no target organ damage.  His calculated 10 year risk of coronary heart disease is 22 %.  Today's blood pressure is 158/112.  His blood pressure goal is < 125/75.  Lipid Assessment/Plan:      Based on NCEP/ATP III, the patient's risk factor category is "2 or more risk factors and a calculated 10 year CAD risk of > 20%".  From this information, the patient's calculated lipid goals are as follows: Total cholesterol goal is 200; LDL cholesterol goal is 100; HDL cholesterol goal is 40; Triglyceride goal is 150.    Patient Instructions: 1)  Please schedule a follow-up appointment in 1 month. Prescriptions: LYRICA 75 MG CAPS (PREGABALIN) two times a day  #60 x 3   Entered and Authorized by:   Weston Settle MD   Signed by:   Weston Settle MD on 07/01/2008   Method used:   Print then Give to Patient   RxID:   [id number]      Appended Document: physical/arc faxed  referral to [facility], office to sched appt/cnd  Appended Document: physical/arc Pt sched with GI April 29th.

## 2010-05-03 NOTE — Miscellaneous (Signed)
Summary: Orders Update  Clinical Lists Changes  Problems: Added new problem of HYPOPOTASSEMIA (ICD-276.8) Orders: Added new Test order of TLB-BMP (Basic Metabolic Panel-BMET) (80048-METABOL) - Signed 

## 2010-05-03 NOTE — Letter (Signed)
Summary: [facility]   Imported By: Eliezer Mccoy 12/26/2007 10:30:57  _____________________________________________________________________  External Attachment:    Type:   Image     Comment:   External Document

## 2010-05-03 NOTE — Letter (Signed)
Summary: Lab Reports  Lab Reports   Imported By: Wyatt Mage LPN [id number]  _____________________________________________________________________  External Attachment:    Type:   Image     Comment:   External Document

## 2010-05-03 NOTE — Medication Information (Signed)
Summary: PA  PA   Imported By: Eliezer Mccoy 09/09/2008 14:59:40  _____________________________________________________________________  External Attachment:    Type:   Image     Comment:   External Document

## 2010-05-03 NOTE — Medication Information (Signed)
Summary: protonix  protonix   Imported By: Eliezer Mccoy 08/07/2008 10:46:28  _____________________________________________________________________  External Attachment:    Type:   Image     Comment:   External Document

## 2010-05-03 NOTE — Assessment & Plan Note (Signed)
Summary: follow up 4 week/slj   Vital Signs:  Patient Profile:   65 Years Old Male Height:     68.75 inches Weight:      253 pounds BMI:     37.77 O2 Sat:      97 % Pulse rate:   66 / minute Resp:     14 per minute BP sitting:   171 / 98  Vitals Entered By: Deidre Ala (March 19, 2008 10:01 AM)                 PCP:  Weston Settle, MD  Chief Complaint:  follow up visit.  History of Present Illness: Pt in for recheck.  He had recent labs and this is reviewed:  1. CMP - BS 108. He is working on diet and exersize and has lost 5 pounds since last visit. He states he is eating less but adds not exersizing due to cold and wet weather.  2. Lipids - see below 3. PSA - 2.16. He states he gets 3 to 4 times a night and states ran out of Flomax. He used samples for 2 weeks and saw no difference. He has no hesitancy, denies weak stream and states pees small amounts when he pees. States symptoms never during the day - states can go without peeing and then at night "all hell breaks lose".  He denies any side-efefcts on Flomax.  He has Vit D and B12 def. He is using supplements and states his feet feel do much better. The burning has improved and he notes he does have some leg cramps at nt night. He denies side-effects and he notes feels better over all.   He now presents.  Hypertension History:      He denies headache, chest pain, palpitations, dyspnea with exertion, orthopnea, PND, peripheral edema, visual symptoms, neurologic problems, syncope, and side effects from treatment.  He notes no problems with any antihypertensive medication side effects.  Further comments include: He sees Dr. Benjaman Lobe for HTN Rx and has appt in January. He is watching salt. He is not exersizing at this time. He can afford medications and takes daily. States not sure why BP stay so high.        Positive major cardiovascular risk factors include male age 39 years old or older, hyperlipidemia, hypertension,  and current tobacco user.  Negative major cardiovascular risk factors include no history of diabetes and negative family history for ischemic heart disease.        Further assessment for target organ damage reveals no history of ASHD, stroke/TIA, or peripheral vascular disease.    Lipid Management History:      Positive NCEP/ATP III risk factors include male age 9 years old or older, current tobacco user, and hypertension.  Negative NCEP/ATP III risk factors include non-diabetic, no family history for ischemic heart disease, no ASHD (atherosclerotic heart disease), no prior stroke/TIA, no peripheral vascular disease, and no history of aortic aneurysm.        The patient states that he knows about the "Therapeutic Lifestyle Change" diet.  His compliance with the TLC diet is fair.  The patient expresses understanding of adjunctive measures for cholesterol lowering.  Comments: Working on diet. Not open to more pills but will agree to higher dose on Caduet.      Prior Medications Reviewed Using: Patient Recall  Updated Prior Medication List: ADULT ASPIRIN EC LOW STRENGTH 81 MG  TBEC (ASPIRIN) One daily COREG CR 40 MG  CP24 (CARVEDILOL  PHOSPHATE) One daily LEXAPRO 10 MG  TABS (ESCITALOPRAM OXALATE) One daily CADUET 10-10 MG TABS (AMLODIPINE-ATORVASTATIN) One daily PANTOPRAZOLE SODIUM 40 MG  TBEC (PANTOPRAZOLE SODIUM) One daily DIOVAN 320 MG  TABS (VALSARTAN) One daily CATAPRES 0.1 MG  TABS (CLONIDINE HCL) two times a day LABETALOL HCL 200 MG  TABS (LABETALOL HCL) One two times a day VITAMIN D 24401 UNIT CAPS (ERGOCALCIFEROL) One weekly FLOMAX 0.4 MG XR24H-CAP (TAMSULOSIN HCL) One daily at bedtime.  Current Allergies (reviewed today): ! SULF-10  Past Medical History:    Reviewed history from 08/13/2007 and no changes required:       Current Problems:        PROTEINURIA (ICD-791.0)       ? of ABDOMINAL AORTIC ANEURYSM (ICD-441.4)       ONYCHOMYCOSIS (ICD-110.1)       PLANTAR FASCIITIS,  BILATERAL (ICD-728.71)       NEPHROLITHIASIS, HX OF (ICD-V13.01)       BACK PAIN (ICD-724.5)       OBESITY NOS (ICD-278.00)       SLEEP APNEA - 4CM H2O (ICD-780.57)       TOBACCO ABUSE (ICD-305.1)       PTSD (ICD-309.81)       PUD (ICD-533.90)       HYPERTENSION (ICD-401.9)       HYPERLIPIDEMIA (ICD-272.4)       GOUT (ICD-274.9)       ANXIETY (ICD-300.00)         Past Surgical History:    Reviewed history from 10/22/2007 and no changes required:       Broken arms       Hernia repair - umbilical       Collapsed lung - right - hx of pushing xray machine and had spontaneous pneuomothorax       Kidney stones       JUne 2009 - 1. Closure of penetrating atherosclerotic ulcer infrarenal aorta with             endovascular stent graft (Endologix Powerlink bifurcated stent             graft 25 x 16 x 120, proximal cuff 25 x 25 x 75).         2. Ultrasound guided percutaneous access left common femoral artery.JUne 12   Family History:    Reviewed history from 01/31/2007 and no changes required:       Father: deceased, lung ca/ asbestos 77       Mother: deceased  24 Alzheimers       Siblings: 1 sister 28, hx unknown                      2 brothers, 77, 55, HTN, hrt problems, renal cancer  Social History:    Reviewed history from 01/31/2007 and no changes required:       Married       Occupation: Theatre manager       Current Smoker       Alcohol use-yes   Risk Factors: Tobacco use:  current    Year started:  50    Cigarettes:  Yes -- 1 pack(s) per day Alcohol use:  yes  Family History Risk Factors:    Family History of MI in females < 110 years old:  no    Family History of MI in males < 74 years old:  no   Review of Systems      See HPI  Physical Exam  General:     Well-developed,well-nourished,in no acute distress; alert,appropriate and cooperative throughout examination. Obese. Lungs:     Normal respiratory effort, chest expands symmetrically. Lungs are  clear to auscultation, no crackles or wheezes. Heart:     Normal rate and regular rhythm. S1 and S2 normal without gallop, murmur, click, rub or other extra sounds. Abdomen:     Bowel sounds positive,abdomen soft and non-tender without masses, organomegaly or hernias noted. Rectal:     Defer to Urology Extremities:     No clubbing, cyanosis, edema, or deformity noted with normal full range of motion of all joints.   Psych:     Cognition and judgment appear intact. Alert and cooperative with normal attention span and concentration. No apparent delusions, illusions, hallucinations    Impression & Recommendations:  Problem # 1:  VITAMIN B12 DEFICIENCY (ICD-266.2) Injection today. Will defer CBC and labs to return in 6 weeks per his request to minimize blood draws and cost. Orders: Vit B12 1000 mcg (J3420) Admin of Therapeutic Inj  intramuscular or subcutaneous [id number])   Problem # 2:  VITAMIN D DEFICIENCY (ICD-268.9) Cont Rx.   Problem # 3:  HYPERTENSION (ICD-401.9) BP high. Refill meds. See Dr. Lowanda Foster for optomization in early January. Limit salt. Lose weight. His updated medication list for this problem includes:    Coreg Cr 40 Mg Cp24 (Carvedilol phosphate) ..... One daily    Caduet 10-20 Mg Tabs (Amlodipine-atorvastatin) ..... One daily    Diovan 320 Mg Tabs (Valsartan) ..... One daily    Catapres 0.1 Mg Tabs (Clonidine hcl) .[name]..[name] Two times a day    Labetalol Hcl 200 Mg Tabs (Labetalol hcl) ..... One two times a day   Problem # 4:  HYPERLIPIDEMIA (ICD-272.4) Increase Caduet to get LDL to goal. Councelled diet, exersize and low fat intake. Recheck LFts in 6 weeks. His updated medication list for this problem includes:    Caduet 10-20 Mg Tabs (Amlodipine-atorvastatin) ..... One daily   Problem # 5:  OBESITY NOS (ICD-278.00) Happy with weight loss progress. Cont TLC with diet, exersize and low carb intake a must. Encouraqged walking 30 minutes 3 to 5 times a week.  Problem  # 6:  BENIGN PROSTATIC HYPERTROPHY, WITH OBSTRUCTION (ICD-600.01) Refer [facility] to optomize given lack of response to Flomax after two weeks of use. Agree. Nurse to refer. Orders: Urology Referral (Urology)   Complete Medication List: 1)  Adult Aspirin Ec Low Strength 81 Mg Tbec (Aspirin) .... One daily 2)  Coreg Cr 40 Mg Cp24 (Carvedilol phosphate) .... One daily 3)  Lexapro 10 Mg Tabs (Escitalopram oxalate) .... One daily 4)  Caduet 10-20 Mg Tabs (Amlodipine-atorvastatin) .... One daily 5)  Pantoprazole Sodium 40 Mg Tbec (Pantoprazole sodium) .... One daily 6)  Diovan 320 Mg Tabs (Valsartan) .... One daily 7)  Catapres 0.1 Mg Tabs (Clonidine hcl) .... Two times a day 8)  Labetalol Hcl 200 Mg Tabs (Labetalol hcl) .... One two times a day 9)  Vitamin D 50000 Unit Caps (Ergocalciferol) .... One weekly 10)  Flomax 0.4 Mg Xr24h-cap (Tamsulosin hcl) .... One daily at bedtime.  Hypertension Assessment/Plan:      The patient's hypertensive risk group is category B: At least one risk factor (excluding diabetes) with no target organ damage.  His calculated 10 year risk of coronary heart disease is 40 %.  Today's blood pressure is 171/98.  His blood pressure goal is < 125/75.  Lipid Assessment/Plan:  Based on NCEP/ATP III, the patient's risk factor category is "2 or more risk factors and a calculated 10 year CAD risk of > 20%".  From this information, the patient's calculated lipid goals are as follows: Total cholesterol goal is 200; LDL cholesterol goal is 100; HDL cholesterol goal is 40; Triglyceride goal is 150.     Patient Instructions: 1)  Please schedule a follow-up appointment in 6 weeks.   Prescriptions: CADUET 10-20 MG TABS (AMLODIPINE-ATORVASTATIN) One daily  #30 x 3   Entered and Authorized by:   Weston Settle MD   Signed by:   Weston Settle MD on 03/19/2008   Method used:   Print then Give to Patient   RxID:   [id number] LABETALOL HCL 200 MG  TABS  (LABETALOL HCL) One two times a day  #60 x 3   Entered and Authorized by:   Weston Settle MD   Signed by:   Weston Settle MD on 03/19/2008   Method used:   Print then Give to Patient   RxID:   [id number] CATAPRES 0.1 MG  TABS (CLONIDINE HCL) two times a day  #60 x 3   Entered and Authorized by:   Weston Settle MD   Signed by:   Weston Settle MD on 03/19/2008   Method used:   Print then Give to Patient   RxID:   [id number] DIOVAN 320 MG  TABS (VALSARTAN) One daily  #30 x 3   Entered and Authorized by:   Weston Settle MD   Signed by:   Weston Settle MD on 03/19/2008   Method used:   Print then Give to Patient   RxID:   [id number] COREG CR 40 MG  CP24 (CARVEDILOL PHOSPHATE) One daily  #30 Each x 3   Entered and Authorized by:   Weston Settle MD   Signed by:   Weston Settle MD on 03/19/2008   Method used:   Print then Give to Patient   RxID:   [id number] 10 MG  TABS (ESCITALOPRAM OXALATE) One daily  #30 Each x 3   Entered and Authorized by:   Weston Settle MD   Signed by:   Weston Settle MD on 03/19/2008   Method used:   Print then Give to Patient   [id number] CADUET 10-10 MG TABS (AMLODIPINE-ATORVASTATIN) One daily  #30 x 3   Entered and Authorized by:   Weston Settle MD   Signed by:   Weston Settle MD on 03/19/2008   Method used:   Print then Give to Patient   RxID:   [id number] PANTOPRAZOLE SODIUM 40 MG  TBEC (PANTOPRAZOLE SODIUM) One daily  #30 x 3   Entered and Authorized by:   Weston Settle MD   Signed by:   Weston Settle MD on 03/19/2008   Method used:   Print then Give to Patient   RxID:   [id number] FLOMAX 0.4 MG XR24H-CAP (TAMSULOSIN HCL) One daily at bedtime.  #30 x 3   Entered and Authorized by:   Weston Settle MD   Signed by:   Weston Settle MD on 03/19/2008   Method used:   Print then Give to Patient   RxID:    [id number]  ]  Medication Administration  Injection # 1:    Medication: Vit B12 1000 mcg    Diagnosis: VITAMIN B12 DEFICIENCY (ICD-266.2)    Route: IM    Site: L thigh    Exp Date: 05/04/2009    Lot #: [id number]  Mfr: Radiographer, therapeutic    Patient tolerated injection without complications    Given by: Deidre Ala (March 19, 2008 10:37 AM)  Orders Added: 1)  Est. Patient Level IV [id number] 2)  Urology Referral [Urology] 3)  Vit B12 1000 mcg [J3420] 4)  Admin of Therapeutic Inj  intramuscular or subcutaneous [[id number]]  Appended Document: follow up 4 week/slj [facility]

## 2010-05-03 NOTE — Progress Notes (Signed)
Summary: culture results letter mailed  Phone Note Outgoing Call   Call placed by: Druscilla Brownie,  March 22, 2007 11:43 AM Summary of Call: message left for patient to return call .................................................................Marland KitchenMarland KitchenDruscilla Brownie  March 22, 2007 11:43 AM   called, no answer, letter & script mailed .................................................................Marland KitchenMarland KitchenDruscilla Brownie  March 25, 2007 9:44 AM           Initial call taken by: Druscilla Brownie,  March 25, 2007 9:44 AM
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
## 2010-05-03 NOTE — Medication Information (Signed)
Summary: pantoprazole  pantoprazole   Imported By: Eliezer Mccoy 08/25/2008 09:34:20  _____________________________________________________________________  External Attachment:    Type:   Image     Comment:   External Document

## 2010-05-03 NOTE — Letter (Signed)
[facility]   Imported By: Eliezer Mccoy 10/20/2008 14:35:07  _____________________________________________________________________  External Attachment:    Type:   Image     Comment:   External Document

## 2010-05-03 NOTE — Letter (Signed)
Summary: New Patient Screening Form  New Patient Screening Form   Imported By: Wyatt Mage LPN [id number]  _____________________________________________________________________  External Attachment:    Type:   Image     Comment:   External Document

## 2010-05-03 NOTE — Assessment & Plan Note (Signed)
Summary: follow up 2 week/slj   Vital Signs:  Patient Profile:   65 Years Old Male Height:     68.75 inches Weight:      246 pounds BMI:     36.72 O2 Sat:      98 % Pulse rate:   91 / minute Resp:     14 per minute BP sitting:   190 / 135  Vitals Entered By: Deidre Ala (Aug 13, 2007 9:25 AM)                 PCP:  Weston Settle, MD  Chief Complaint:  follow up visit.  History of Present Illness: Pt in for recheck.  He has severe HTN and comes in with med list today. He is using Coreg CR, Caduet and Diovan daily. He denies chest pain, SOB,orthopnea, PND and plapitations. No leg swelling. Denies headaches and dizzyness and states occasionally may have mild headache in mid forehead. None now. He had recent renal MRA and this was normal for renal artery stenosis. 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 We are await full 24 urine studies but prelim is reviewed:  1. 24 hr urine significant for 900 mg of Protein 2. Other studies neg thus far with cortisol and VMA pending.  He is worried about BP and awareof CVA, CAD and anuerysm risk. He is agreebale with referal and adds has to drive Mom-in-law back to [place] as she is demented. States will not leave until first of June. He is agreeable with Dr. Lowanda Foster.  He states he attributes his high BP to a lot of worrying - finances, his ailments, his mom-in-law and getting her back to [place]. He is sleeping fair. He has fair energy and states tired most of the time. Focus ois good. No suicidal ideations. He is out of Lexapro for 48 hours and needs sampkles if possible. He is always worried. States agreeble to sepcialist to aide with learning of stress coping skills.  He now presents.   Hypertension History:      See HPI.        Positive major cardiovascular risk factors include male age 106 years old or older, hyperlipidemia, hypertension, and current tobacco user.   Negative major cardiovascular risk factors include no history of diabetes and negative family history for ischemic heart disease.        Further assessment for target organ damage reveals no history of ASHD, stroke/TIA, or peripheral vascular disease.    Lipid Management History:      Positive NCEP/ATP III risk factors include male age 38 years old or older, HDL cholesterol less than 40, current tobacco user, and hypertension.  Negative NCEP/ATP III risk factors include non-diabetic, no family history for ischemic heart disease, no ASHD (atherosclerotic heart disease), no prior stroke/TIA, no peripheral vascular disease, and no history of aortic aneurysm.        The patient states that he knows about the "Therapeutic Lifestyle Change" diet.  His compliance with the TLC diet is poor.  The patient expresses understanding of adjunctive measures for cholesterol lowering.  Adjunctive measures started by the patient include aerobic exercise, fiber, and omega-3 supplements.        Prior Medications Reviewed Using: Patient Recall  Updated Prior Medication List: ADULT ASPIRIN EC LOW STRENGTH 81 MG  TBEC (ASPIRIN) One daily COREG CR 40 MG  CP24 (CARVEDILOL PHOSPHATE) One daily ALLOPURINOL 100 MG  TABS (ALLOPURINOL) One  daily LEXAPRO 10 MG  TABS (ESCITALOPRAM OXALATE) One daily CADUET 10-20 MG  TABS (AMLODIPINE-ATORVASTATIN) One daily PANTOPRAZOLE SODIUM 40 MG  TBEC (PANTOPRAZOLE SODIUM) One daily DIOVAN 320 MG  TABS (VALSARTAN) One daily LAMISIL 250 MG  TABS (TERBINAFINE HCL) One daily COLCHICINE 0.6 MG  TABS (COLCHICINE) One two times a day CATAPRES 0.1 MG  TABS (CLONIDINE HCL) two times a day  Current Allergies (reviewed today): ! SULF-10 (SULFACETAMIDE SODIUM)  Past Medical History:    Reviewed history from 01/31/2007 and no changes required:       Current Problems:        PROTEINURIA (ICD-791.0)       ? of ABDOMINAL AORTIC ANEURYSM (ICD-441.4)       ONYCHOMYCOSIS (ICD-110.1)       PLANTAR  FASCIITIS, BILATERAL (ICD-728.71)       NEPHROLITHIASIS, HX OF (ICD-V13.01)       BACK PAIN (ICD-724.5)       OBESITY NOS (ICD-278.00)       SLEEP APNEA - 4CM H2O (ICD-780.57)       TOBACCO ABUSE (ICD-305.1)       PTSD (ICD-309.81)       PUD (ICD-533.90)       HYPERTENSION (ICD-401.9)       HYPERLIPIDEMIA (ICD-272.4)       GOUT (ICD-274.9)       ANXIETY (ICD-300.00)         Past Surgical History:    Reviewed history from 01/31/2007 and no changes required:       Broken arms       Hernia repair - umbilical       Collapsed lung - right - hx of pushing xray machine and had spontaneous pneuomothorax       Kidney stones   Family History:    Reviewed history from 01/31/2007 and no changes required:       Father: deceased, lung ca/ asbestos 87       Mother: deceased  72 Alzheimers       Siblings: 1 sister 16, hx unknown                      2 brothers, 5, 77, HTN, hrt problems, renal cancer  Social History:    Reviewed history from 01/31/2007 and no changes required:       Married       Occupation: Theatre manager       Current Smoker       Alcohol use-yes   Risk Factors:  Tobacco use:  current    Year started:  19    Cigarettes:  Yes -- 1 pack(s) per day    Counseled to quit/cut down tobacco use:  yes Alcohol use:  yes  Family History Risk Factors:    Family History of MI in females < 31 years old:  no    Family History of MI in males < 33 years old:  no   Review of Systems      See HPI   Physical Exam  General:     Well-developed,well-nourished,in no acute distress; alert,appropriate and cooperative throughout examination. Obese. Lungs:     Normal respiratory effort, chest expands symmetrically. Lungs are clear to auscultation, no crackles or wheezes. Heart:     Normal rate and regular rhythm. S1 and S2 normal without gallop, murmur, click, rub or other extra sounds. Abdomen:     Bowel sounds positive,abdomen soft and non-tender without masses,  organomegaly or  hernias noted. Extremities:     No clubbing, cyanosis, edema, or deformity noted with normal full range of motion of all joints.   Cervical Nodes:     No lymphadenopathy noted Psych:     Cognition and judgment appear intact. Alert and cooperative with normal attention span and concentration. No apparent delusions, illusions, hallucinations    Impression & Recommendations:  Problem # 1:  ? of ABDOMINAL AORTIC ANEURYSM (ICD-441.4) Discussed. He is vry concerned. Emphasized need to optomize risk factors for diease including getting BP to goal, optomizing lipids,losing weight and quitting smoking. He will see Vascular on Thursday withoptomization from there.  Problem # 2:  HYPERTENSION (ICD-401.9) Poor control. Does not appear to have secondary cause. Will await final on 24 hr urine eval. Refer nephrology to aide in BP optomization as pt on max doses of several meds and BP does not budge. Agreeable. Hold off on taking mom-in-law back to [place] until this complete.Nurse to refer asap. Pt agrees. Limit salt. His updated medication list for this problem includes:    Coreg Cr 40 Mg Cp24 (Carvedilol phosphate) ..... One daily    Caduet 10-20 Mg Tabs (Amlodipine-atorvastatin) ..... One daily    Diovan 320 Mg Tabs (Valsartan) ..... One daily    Catapres 0.1 Mg Tabs (Clonidine hcl) .[name]..[name] Two times a day  Orders: Nephrology Referral (Nephro)   Problem # 3:  HYPERLIPIDEMIA (B2193296.4) TLC for three months. Rx as is. His updated medication list for this problem includes:    Caduet 10-20 Mg Tabs (Amlodipine-atorvastatin) ..... One daily   Problem # 4:  TOBACCO ABUSE (ICD-305.1) Councelled cessationm. Working on this.States down 5 cigs per day. Advised need to quit completely.Not open to Rx at this time. Follow.  Problem # 5:  OBESITY NOS (ICD-278.00) See above - limit fatty fods, eat diet high in fiber and keepportion control in mind. Aim for 1500 caloerie diet. States hard  but will try.  Problem # 6:  ANXIETY (ICD-300.00) On lexapro.Refer Psychologo to aide in optomization as he worries about everything. May benefit from Zoloft.  Councelled stress coping skills. His updated medication list for this problem includes:    Lexapro 10 Mg Tabs (Escitalopram oxalate) ..... One daily  Orders: Psychology Referral (Psychology)   Complete Medication List: 1)  Adult Aspirin Ec Low Strength 81 Mg Tbec (Aspirin) .... One daily 2)  Coreg Cr 40 Mg Cp24 (Carvedilol phosphate) .... One daily 3)  Allopurinol 100 Mg Tabs (Allopurinol) .... One daily 4)  Lexapro 10 Mg Tabs (Escitalopram oxalate) .... One daily 5)  Caduet 10-20 Mg Tabs (Amlodipine-atorvastatin) .... One daily 6)  Pantoprazole Sodium 40 Mg Tbec (Pantoprazole sodium) .... One daily 7)  Diovan 320 Mg Tabs (Valsartan) .... One daily 8)  Colchicine 0.6 Mg Tabs (Colchicine) .... One two times a day 9)  Catapres 0.1 Mg Tabs (Clonidine hcl) .... Two times a day  Hypertension Assessment/Plan:      The patient's hypertensive risk group is category B: At least one risk factor (excluding diabetes) with no target organ damage.  His calculated 10 year risk of coronary heart disease is 22 %.  Today's blood pressure is 190/135.  His blood pressure goal is < 140/90.  Lipid Assessment/Plan:      Based on NCEP/ATP III, the patient's risk factor category is "2 or more risk factors and a calculated 10 year CAD risk of > 20%".  From this information, the patient's calculated lipid goals are as follows: Total cholesterol goal is  200; LDL cholesterol goal is 100; HDL cholesterol goal is 40; Triglyceride goal is 150.     Patient Instructions: 1)  Please schedule a follow-up appointment in 1 month.   ]  Appended Document: follow up 2 week/slj lab results sent to Dr. Lowanda Foster

## 2010-05-03 NOTE — Assessment & Plan Note (Signed)
Summary: FOLLOW UP 6 WEEK/SLJ   Vital Signs:  Patient Profile:   65 Years Old Male Height:     68.75 inches Weight:      264 pounds BMI:     39.41 O2 Sat:      96 % Pulse rate:   72 / minute Resp:     14 per minute BP sitting:   158 / 113  Vitals Entered By: Deidre Ala LPN (March  8, 624THL 075-GRM AM)                 PCP:  Weston Settle, MD  Chief Complaint:  follow up visit.  History of Present Illness: Pt in for recheck.  He states his legs hurt awefully at night. They cramp up. States both sides. States feet and up. States cramping at night. Hurts to walk the next day. He states cramps will wake him from sleep. He states worse with nothing. Better lying down and staying off leggs during the day. States hurts in shins and calves. He has had this for 2 weeks now. He denies medications changes. He is eating some salt in food but does not add any. He states he would describe calves as pulling into a knot. He has had normal ABI and has B12 def. He has not had shot in two months. He also cont on Vit D and takes nightly. He had NCV and this showedmotor-sensory polyneuropathy.  He states he did not have symptoms at time of eval as above.  He now presents.   Hypertension History:      He complains of dyspnea with exertion, but denies headache, chest pain, palpitations, orthopnea, PND, peripheral edema, visual symptoms, neurologic problems, syncope, and side effects from treatment.  Further comments include: He has not seen Dr. Lowanda Foster in few months and has appt set for Aug 05, 2008. He has not done home readings. He has a new BP cuff at home and has not done home readings. State BP always good at Dr. Lowanda Foster.        Positive major cardiovascular risk factors include male age 22 years old or older, hyperlipidemia, hypertension, and current tobacco user.  Negative major cardiovascular risk factors include no history of diabetes and negative family history for ischemic heart disease.        Further assessment for target organ damage reveals no history of ASHD, stroke/TIA, or peripheral vascular disease.    Lipid Management History:      Positive NCEP/ATP III risk factors include male age 49 years old or older, current tobacco user, and hypertension.  Negative NCEP/ATP III risk factors include non-diabetic, no family history for ischemic heart disease, no ASHD (atherosclerotic heart disease), no prior stroke/TIA, no peripheral vascular disease, and no history of aortic aneurysm.        The patient states that he knows about the "Therapeutic Lifestyle Change" diet.  His compliance with the TLC diet is fair.  The patient expresses understanding of adjunctive measures for cholesterol lowering.  Adjunctive measures started by the patient include fiber.  Comments: Agreeable with Lipid recheck. He is not exersizing. He states tried to and has no energy to do this. He is npt eating healthy either.      Prior Medications Reviewed Using: Patient Recall  Updated Prior Medication List: ADULT ASPIRIN EC LOW STRENGTH 81 MG  TBEC (ASPIRIN) One daily CARVEDILOL 12.5 MG TABS (CARVEDILOL) two times a day LEXAPRO 10 MG  TABS (ESCITALOPRAM OXALATE) One daily CADUET 10-20  MG TABS (AMLODIPINE-ATORVASTATIN) One daily PANTOPRAZOLE SODIUM 40 MG  TBEC (PANTOPRAZOLE SODIUM) One daily DIOVAN 320 MG  TABS (VALSARTAN) One daily CATAPRES 0.1 MG  TABS (CLONIDINE HCL) two times a day LABETALOL HCL 200 MG  TABS (LABETALOL HCL) One two times a day VITAMIN D 96295 UNIT CAPS (ERGOCALCIFEROL) One weekly FLOMAX 0.4 MG XR24H-CAP (TAMSULOSIN HCL) One daily at bedtime.  Current Allergies (reviewed today): ! SULF-10  Past Medical History:    Reviewed history from 08/13/2007 and no changes required:       Current Problems:        PROTEINURIA (ICD-791.0)       ? of ABDOMINAL AORTIC ANEURYSM (ICD-441.4)       ONYCHOMYCOSIS (ICD-110.1)       PLANTAR FASCIITIS, BILATERAL (ICD-728.71)       NEPHROLITHIASIS, HX OF  (ICD-V13.01)       BACK PAIN (ICD-724.5)       OBESITY NOS (ICD-278.00)       SLEEP APNEA - 4CM H2O (ICD-780.57)       TOBACCO ABUSE (ICD-305.1)       PTSD (ICD-309.81)       PUD (ICD-533.90)       HYPERTENSION (ICD-401.9)       HYPERLIPIDEMIA (ICD-272.4)       GOUT (ICD-274.9)       ANXIETY (ICD-300.00)         Past Surgical History:    Reviewed history from 10/22/2007 and no changes required:       Broken arms       Hernia repair - umbilical       Collapsed lung - right - hx of pushing xray machine and had spontaneous pneuomothorax       Kidney stones       JUne 2009 - 1. Closure of penetrating atherosclerotic ulcer infrarenal aorta with             endovascular stent graft (Endologix Powerlink bifurcated stent             graft 25 x 16 x 120, proximal cuff 25 x 25 x 75).         2. 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   Review of Systems      See HPI  General      Denies chills, fever, and sweats.  GI      Denies abdominal pain,  constipation, diarrhea, nausea, and vomiting.  GU      He states he cont to have nocturia. States Flomax not helping. BNever went for Urology eval - did not have money.  He does not want to see them as he fears procedure of TURP.    Physical Exam  General:     Well-developed, obesity ,in no acute distress; alert,appropriate and cooperative throughout examination. Obese. Lungs:     Normal respiratory effort, chest expands symmetrically. Lungs are clear to auscultation, no crackles or wheezes. Heart:     Normal rate and regular rhythm. S1 and S2 normal without gallop, murmur, click, rub or other extra sounds. Abdomen:     Bowel sounds positive,abdomen soft and non-tender without masses, organomegaly or hernias noted. Extremities:     No clubbing, cyanosis, edema, or deformity noted with normal full range of motion of all joints.      Impression & Recommendations:  Problem # 1:  VITAMIN B12 DEFICIENCY (ICD-266.2) Check B12 level. Resume monthly injections. Check CBC as well.  Orders: T-CBC w/Diff [id number]) T-Vitamin B12 [id number]) Vit B12 1000 mcg (J3420) Admin of Therapeutic Inj  intramuscular or subcutaneous [id number])   Problem # 2:  VITAMIN D DEFICIENCY (ICD-268.9) Cont Rx as is. Repeat level and optomize.  Orders: T- * Misc. Laboratory test [phone])   Problem # 3:  NEUROPATHY - SENSORY-MOTOR (ICD-355.9) ? role in leg cramps. Check electrolytes. Consider trial Neurontin if not already done vs Mirapex as RLS in diff as well. Pt advised and agreeable. Orders: T-CBC w/Diff [id number]) T-TSH [phone])   Problem # 4:  HYPERTENSION (ICD-401.9) High here. States again always good at Dr. Alvina Chou who is optomizing this. He will log home BP readings and I will review in 2 weeks. He is SOB on exertion and suspect related to dual B-blokcers as per Dr. Maryruth Eve, obesity and deconditioning. Recheck 2 weeks and optomize.  His updated medication list for this problem  includes:    Carvedilol 12.5 Mg Tabs (Carvedilol) .[name]..[name] Two times a day    Caduet 10-20 Mg Tabs (Amlodipine-atorvastatin) ..... One daily    Diovan 320 Mg Tabs (Valsartan) ..... One daily    Catapres 0.1 Mg Tabs (Clonidine hcl) .[name]..[name] Two times a day    Labetalol Hcl 200 Mg Tabs (Labetalol hcl) ..... One two times a day  Orders: T-Comprehensive Metabolic Panel ([id number])   Problem # 5:  HYPERLIPIDEMIA (ICD-272.4) Repeat and titrate Rx. TLC diet advised.  His updated medication list for this problem includes:    Caduet 10-20 Mg Tabs (Amlodipine-atorvastatin) ..... One daily  Orders: T-Comprehensive Metabolic Panel ([id number]) T-Lipid Profile [phone])   Problem # 6:  TOBACCO ABUSE (ICD-305.1) COuncelled need to quit. Aware of risk and benefit. States will consider.  Problem # 7:  BENIGN PROSTATIC HYPERTROPHY, WITH OBSTRUCTION (ICD-600.01) Avised Urology eval again as Flomax not helping BPH. Advised need to see them but declines at present sighting fear of TURP. Tried to reassure and he notes will consider.   Problem # 8:  Preventive Health Care (ICD-V70.0) Neeeds screening colonoscopy near future. Hx of polyps. States will consdier.  Complete Medication List: 1)  Adult Aspirin Ec Low Strength 81 Mg Tbec (Aspirin) .... One daily  2)  Carvedilol 12.5 Mg Tabs (Carvedilol) .... Two times a day 3)  Lexapro 10 Mg Tabs (Escitalopram oxalate) .... One daily 4)  Caduet 10-20 Mg Tabs (Amlodipine-atorvastatin) .... One daily 5)  Pantoprazole Sodium 40 Mg Tbec (Pantoprazole sodium) .... One daily 6)  Diovan 320 Mg Tabs (Valsartan) .... One daily 7)  Catapres 0.1 Mg Tabs (Clonidine hcl) .... Two times a day 8)  Labetalol Hcl 200 Mg Tabs (Labetalol hcl) .... One two times a day 9)  Vitamin D 50000 Unit Caps (Ergocalciferol) .... One weekly 10)  Flomax 0.4 Mg Xr24h-cap (Tamsulosin hcl) .... One daily at bedtime.  Hypertension Assessment/Plan:      The patient's hypertensive risk  group is category B: At least one risk factor (excluding diabetes) with no target organ damage.  His calculated 10 year risk of coronary heart disease is 40 %.  Today's blood pressure is 158/113.  His blood pressure goal is < 125/75.  Lipid Assessment/Plan:      Based on NCEP/ATP III, the patient's risk factor category is "2 or more risk factors and a calculated 10 year CAD risk of > 20%".  From this information, the patient's calculated lipid goals are as follows: Total cholesterol goal is 200; LDL cholesterol goal is 100; HDL cholesterol goal is 40; Triglyceride goal is 150.     Patient Instructions: 1)  Please schedule a follow-up appointment in 2 weeks.     Preventive Care Screening  Last Pneumovax:    Date:  06/08/2008    Results:  Refused  Colonoscopy:    Date:  06/08/2008    Next Due:  09/2008    Results:  Hx of polyps.  PSA:    Next Due:  03/2009   Medication Administration  Injection # 1:    Medication: Vit B12 1000 mcg    Diagnosis: VITAMIN B12 DEFICIENCY (ICD-266.2)    Route: IM    Site: L thigh    Exp Date: 05/04/2009    Lot #: [id number]    Mfr: [employer]    Patient tolerated injection without complications    Given by: Deidre Ala LPN (March  8, 624THL 075-GRM AM)  Orders Added: 1)  T-Comprehensive Metabolic Panel [id number] 2)  T-Lipid Profile [[id number]] 3)  T-CBC w/Diff [id number] 4)  T-TSH [id number] 5)  T-Vitamin B12 [[id number]] 6)  T- * Misc. Laboratory test [99999] 7)  Est. Patient Level IV [id number] 8)  Vit B12 1000 mcg [J3420] 9)  Admin of Therapeutic Inj  intramuscular or subcutaneous [id number]

## 2010-05-03 NOTE — Progress Notes (Signed)
Summary: ?AAA  Phone Note Outgoing Call   Call placed by: Weston Settle MD,  July 24, 2007 12:33 PM Summary of Call: Discussed further interpration of study with radiology i.e. Penetrating atherosclerotic ulcer in the distal aorta. Advised some studies suggest could develop into dissecting aneurysm. Pt with mutlpe risk factors for atherosclerosis including HTN, tobacco abuse, obesity etc. Reassured renal arteries normal and no stenosis as cause for HTN. Will need 24 hr urine studies next week. In interim advised Vascualr consult to eval  Penetrating atherosclerotic ulcer in the distal aorta. Agrees. Nurse to refer. Initial call taken by: Weston Settle MD,  July 24, 2007 12:36 PM  New Problems: ? of ABDOMINAL AORTIC ANEURYSM (ICD-441.4)   New Problems: ? of ABDOMINAL AORTIC ANEURYSM (ICD-441.4)

## 2010-05-03 NOTE — Letter (Signed)
Summary: Generic Letter, Intro to Referring  [facility]  [address]   [place], [place] [address]   Phone: [phone]  Fax: [phone]      July 02, 2008             RE: KAESON EARY   11/09/1945                 [place], [place]  [address]  Dear Appt Secretary,   This pt has been scheduled for 07/30/2008 @ 10:30 w/ Neil Crouch, PA.  Pt is aware of appt.         Sincerely,    [facility] Ph: [phone]   Fax: [phone]

## 2010-05-03 NOTE — Assessment & Plan Note (Signed)
Summary: FOLLOW UP 1 MONTH/SLJ   Vital Signs:  Patient Profile:   65 Years Old Male Height:     68.75 inches Weight:      250 pounds BMI:     37.32 O2 Sat:      96 % Temp:     97.9 degrees F Pulse rate:   68 / minute Resp:     14 per minute BP sitting:   159 / 115  Vitals Entered By: Deidre Ala (September 10, 2007 9:43 AM)                 PCP:  Weston Settle, MD  Chief Complaint:  follow up visit.  History of Present Illness: Pt in for recheck.  He has resistant HTN and he is seeing Dr. Lowanda Foster. States BP was 138/83 at Dr. Tyler Deis office.  He was started on Labetaolol in addition to his other medications. States he may start spironolactone next visit and be taken off Coreg. He states he has not had chest pain, SOB, orthopnea, PND, ankle edema and palpitations. He is wathcing salt intake. He is not exersizing.  He is set on Friday to get his penetrating athersclerotic ulcer repeaired.Note in [facility] reviewed and shows  the patient will be measured for potential placement  of an aortic stent graft to cover this area penetrating atherosclerotic  ulcer in the infrarenal aorta. He sees Dr. Drucie Opitz of vascualr surgery. He is slightly anxious about this but not to bad.   He continus to smoke. Half pack daily and states down from 3/4 to 1 pack. Has script for Chantix and told by neprhology not to use until BO optomized.   He now presents.   Hypertension History:      He notes no problems with any antihypertensive medication side effects.        Positive major cardiovascular risk factors include male age 55 years old or older, hyperlipidemia, hypertension, and current tobacco user.  Negative major cardiovascular risk factors include no history of diabetes and negative family history for ischemic heart disease.        Further assessment for target organ damage reveals no history of ASHD, stroke/TIA, or peripheral vascular disease.    Lipid Management History:      Positive  NCEP/ATP III risk factors include male age 5 years old or older, HDL cholesterol less than 40, current tobacco user, and hypertension.  Negative NCEP/ATP III risk factors include non-diabetic, no family history for ischemic heart disease, no ASHD (atherosclerotic heart disease), no prior stroke/TIA, no peripheral vascular disease, and no history of aortic aneurysm.        The patient states that he knows about the "Therapeutic Lifestyle Change" diet.  His compliance with the TLC diet is poor.  The patient expresses understanding of adjunctive measures for cholesterol lowering.  Adjunctive measures started by the patient include aerobic exercise, fiber, ASA, and omega-3 supplements.  Comments: Trying to watch diet.      Prior Medications Reviewed Using: Patient Recall  labetaolol added  unsure of dose  Updated Prior Medication List: ADULT ASPIRIN EC LOW STRENGTH 81 MG  TBEC (ASPIRIN) One daily COREG CR 40 MG  CP24 (CARVEDILOL PHOSPHATE) One daily ALLOPURINOL 100 MG  TABS (ALLOPURINOL) One daily LEXAPRO 10 MG  TABS (ESCITALOPRAM OXALATE) One daily CADUET 10-20 MG  TABS (AMLODIPINE-ATORVASTATIN) One daily PANTOPRAZOLE SODIUM 40 MG  TBEC (PANTOPRAZOLE SODIUM) One daily DIOVAN 320 MG  TABS (VALSARTAN) One daily COLCHICINE 0.6 MG  TABS (COLCHICINE) One two times a day CATAPRES 0.1 MG  TABS (CLONIDINE HCL) two times a day  Current Allergies (reviewed today): ! SULF-10 (SULFACETAMIDE SODIUM)  Past Medical History:    Reviewed history from 08/13/2007 and no changes required:       Current Problems:        PROTEINURIA (ICD-791.0)       ? of ABDOMINAL AORTIC ANEURYSM (ICD-441.4)       ONYCHOMYCOSIS (ICD-110.1)       PLANTAR FASCIITIS, BILATERAL (ICD-728.71)       NEPHROLITHIASIS, HX OF (ICD-V13.01)       BACK PAIN (ICD-724.5)       OBESITY NOS (ICD-278.00)       SLEEP APNEA - 4CM H2O (ICD-780.57)       TOBACCO ABUSE (ICD-305.1)       PTSD (ICD-309.81)       PUD (ICD-533.90)        HYPERTENSION (ICD-401.9)       HYPERLIPIDEMIA (ICD-272.4)       GOUT (ICD-274.9)       ANXIETY (ICD-300.00)         Past Surgical History:    Reviewed history from 01/31/2007 and no changes required:       Broken arms       Hernia repair - umbilical       Collapsed lung - right - hx of pushing xray machine and had spontaneous pneuomothorax       Kidney stones      Physical Exam  General:     Well-developed,well-nourished,in no acute distress; alert,appropriate and cooperative throughout examination. Obese. Lungs:     Normal respiratory effort, chest expands symmetrically. Lungs are clear to auscultation, no crackles or wheezes. Heart:     Normal rate and regular rhythm. S1 and S2 normal without gallop, murmur, click, rub or other extra sounds. Abdomen:     Bowel sounds positive,abdomen soft and non-tender without masses, organomegaly or hernias noted. Extremities:     No clubbing, cyanosis, edema, or deformity noted with normal full range of motion of all joints.   Cervical Nodes:     No lymphadenopathy noted Psych:     Cognition and judgment appear intact. Alert and cooperative with normal attention span and concentration. No apparent delusions, illusions, hallucinations    Impression & Recommendations:  Problem # 1:  ? of ABDOMINAL AORTIC ANEURYSM (ICD-441.4) Set for pre-op in am and surgery Friday. BP may be disqaulifying agent. Advised need to make sure anasthesia knows seeing Dr. Lowanda Foster for optomization. Agrees.  Problem # 2:  TOBACCO ABUSE (ICD-305.1) Councelled. Cutting bck on own. Encouraged to stay course. Again note that he has hantix at home and told by nephrology that he cannot take until BP optomized.  Aware of risk andbenefit of qutting and edcuated improtance ot optomize risk factors for CAD and atherosclerosis.  Problem # 3:  HYPERTENSION (ICD-401.9) Improved. On Labetalol. Await note from nehrology. Pt to take all meds to appt in am for pre-op. See Dr.  Lowanda Foster as set in August - sooner if concern. His updated medication list for this problem includes:    Coreg Cr 40 Mg Cp24 (Carvedilol phosphate) ..... One daily    Caduet 10-20 Mg Tabs (Amlodipine-atorvastatin) ..... One daily    Diovan 320 Mg Tabs (Valsartan) ..... One daily    Catapres 0.1 Mg Tabs (Clonidine hcl) .[name]..[name] Two times a day   Problem # 4:  HYPERLIPIDEMIA (ICD-272.4) Rx as is. TLC a must. recheck  6 weeks if labs persist as before, start Niaspan to lower trig and raise HDL. His updated medication list for this problem includes:    Caduet 10-20 Mg Tabs (Amlodipine-atorvastatin) ..... One daily   Problem # 5:  OBESITY NOS (ICD-278.00) Again edcuated diet, exersize and portion control.  Agrees. Note weight up 4 pounds since last visit. States has been eating more as anxious about surgery. Advised stress coping skills.  Complete Medication List: 1)  Adult Aspirin Ec Low Strength 81 Mg Tbec (Aspirin) .... One daily 2)  Coreg Cr 40 Mg Cp24 (Carvedilol phosphate) .... One daily 3)  Allopurinol 100 Mg Tabs (Allopurinol) .... One daily 4)  Lexapro 10 Mg Tabs (Escitalopram oxalate) .... One daily 5)  Caduet 10-20 Mg Tabs (Amlodipine-atorvastatin) .... One daily 6)  Pantoprazole Sodium 40 Mg Tbec (Pantoprazole sodium) .... One daily 7)  Diovan 320 Mg Tabs (Valsartan) .... One daily 8)  Colchicine 0.6 Mg Tabs (Colchicine) .... One two times a day 9)  Catapres 0.1 Mg Tabs (Clonidine hcl) .... Two times a day  Hypertension Assessment/Plan:      The patient's hypertensive risk group is category B: At least one risk factor (excluding diabetes) with no target organ damage.  His calculated 10 year risk of coronary heart disease is 22 %.  Today's blood pressure is 159/115.  His blood pressure goal is < 125/75.  Lipid Assessment/Plan:      Based on NCEP/ATP III, the patient's risk factor category is "2 or more risk factors and a calculated 10 year CAD risk of > 20%".  From this information,  the patient's calculated lipid goals are as follows: Total cholesterol goal is 200; LDL cholesterol goal is 100; HDL cholesterol goal is 40; Triglyceride goal is 150.     Patient Instructions: 1)  Please schedule a follow-up appointment in 1 month.   ]

## 2010-05-03 NOTE — Letter (Signed)
Summary: old records  old records   Imported By: Durwin Reges 02/12/2007 11:16:39  _____________________________________________________________________  External Attachment:    Type:   Image     Comment:   External Document

## 2010-05-03 NOTE — Progress Notes (Signed)
  Walk in Patient Form Recieved " Pt left Certification of Health Care Provider For [facility] Serious health Condition" paper sent to [facility]  October 14, 2009 8:06 AM     Appended Document:  paperwork needs to be sent to [facility]..sent over today

## 2010-05-03 NOTE — Medication Information (Signed)
Summary: Protonix PA  Protonix PA   Imported By: Eliezer Mccoy 08/19/2008 16:08:02  _____________________________________________________________________  External Attachment:    Type:   Image     Comment:   External Document

## 2010-05-03 NOTE — Letter (Signed)
Summary: PFTs  PFTs   Imported By: Wyatt Mage LPN [id number]  _____________________________________________________________________  External Attachment:    Type:   Image     Comment:   External Document

## 2010-05-03 NOTE — Assessment & Plan Note (Signed)
Summary: follow up 6 week/slj   Vital Signs:  Patient Profile:   65 Years Old Male Height:     68.75 inches Weight:      250 pounds BMI:     37.32 O2 Sat:      98 % Temp:     98.1 degrees F Pulse rate:   68 / minute Resp:     12 per minute BP sitting:   153 / 92  Vitals Entered By: Deidre Ala (December 02, 2007 3:00 PM)                 PCP:  Weston Settle, MD  Chief Complaint:  follow up visit.  History of Present Illness: Pt in for recheck.  He notes he is doing well.  He states his feet cont with numbness and tingling. He admits he does not eat fruits and veggies. He loves meat and potatoes. B12 and Folate was drawn and result is reviewed:  1. B12 at 268 2. Folate indeterminate at 5  He states feet has numbness and tingling. Has good blood flow after recent anuerysm reapir. He has never had a NCV study. He states he is not on MVI and agrees to start. States sx on top of feet more than bottom.  He denies foot weakness and states hurts at end of day. Denies fall and injuries.  He now presents.  Hypertension History:      He denies headache, chest pain, palpitations, dyspnea with exertion, orthopnea, PND, peripheral edema, visual symptoms, neurologic problems, syncope, and side effects from treatment.  He notes no problems with any antihypertensive medication side effects.  Further comments include: Cont on Rx as per Dr. Lowanda Foster. Told to cont Rx as is. Watching salt. Sees him next month.        Positive major cardiovascular risk factors include male age 36 years old or older, hyperlipidemia, hypertension, and current tobacco user.  Negative major cardiovascular risk factors include no history of diabetes and negative family history for ischemic heart disease.        Further assessment for target organ damage reveals no history of ASHD, stroke/TIA, or peripheral vascular disease.    Lipid Management History:      Positive NCEP/ATP III risk factors include male age 47  years old or older, HDL cholesterol less than 40, current tobacco user, and hypertension.  Negative NCEP/ATP III risk factors include non-diabetic, no family history for ischemic heart disease, no ASHD (atherosclerotic heart disease), no prior stroke/TIA, no peripheral vascular disease, and no history of aortic aneurysm.        The patient states that he knows about the "Therapeutic Lifestyle Change" diet.  His compliance with the TLC diet is poor.  The patient expresses understanding of adjunctive measures for cholesterol lowering.  Adjunctive measures started by the patient include aerobic exercise, fiber, ASA, and omega-3 supplements.  Comments: He is watching diet but could do better. He is not exersizing. States does not have energy to do. Always tired. States wakes up this way. He has sleep apnea and does not use as he cannot tolerate mask.  Aware of risk. States [facility] called and he states never heard back. .      Prior Medications Reviewed Using: Patient Recall  Updated Prior Medication List: ADULT ASPIRIN EC LOW STRENGTH 81 MG  TBEC (ASPIRIN) One daily COREG CR 40 MG  CP24 (CARVEDILOL PHOSPHATE) One daily ALLOPURINOL 100 MG  TABS (ALLOPURINOL) One daily LEXAPRO 10 MG  TABS (  ESCITALOPRAM OXALATE) One daily CADUET 10-20 MG  TABS (AMLODIPINE-ATORVASTATIN) One daily PANTOPRAZOLE SODIUM 40 MG  TBEC (PANTOPRAZOLE SODIUM) One daily DIOVAN 320 MG  TABS (VALSARTAN) One daily COLCHICINE 0.6 MG  TABS (COLCHICINE) One two times a day CATAPRES 0.1 MG  TABS (CLONIDINE HCL) two times a day LABETALOL HCL 200 MG  TABS (LABETALOL HCL) One two times a day  Current Allergies (reviewed today): ! SULF-10 (SULFACETAMIDE SODIUM)  Past Medical History:    Reviewed history from 08/13/2007 and no changes required:       Current Problems:        PROTEINURIA (ICD-791.0)       ? of ABDOMINAL AORTIC ANEURYSM (ICD-441.4)       ONYCHOMYCOSIS (ICD-110.1)       PLANTAR FASCIITIS, BILATERAL (ICD-728.71)        NEPHROLITHIASIS, HX OF (ICD-V13.01)       BACK PAIN (ICD-724.5)       OBESITY NOS (ICD-278.00)       SLEEP APNEA - 4CM H2O (ICD-780.57)       TOBACCO ABUSE (ICD-305.1)       PTSD (ICD-309.81)       PUD (ICD-533.90)       HYPERTENSION (ICD-401.9)       HYPERLIPIDEMIA (ICD-272.4)       GOUT (ICD-274.9)       ANXIETY (ICD-300.00)         Past Surgical History:    Reviewed history from 10/22/2007 and no changes required:       Broken arms       Hernia repair - umbilical       Collapsed lung - right - hx of pushing xray machine and had spontaneous pneuomothorax       Kidney stones       JUne 2009 - 1. Closure of penetrating atherosclerotic ulcer infrarenal aorta with             endovascular stent graft (Endologix Powerlink bifurcated stent             graft 25 x 16 x 120, proximal cuff 25 x 25 x 75).         2. Ultrasound guided percutaneous access left common femoral artery.JUne 12   Family History:    Reviewed history from 01/31/2007 and no changes required:       Father: deceased, lung ca/ asbestos 67       Mother: deceased  51 Alzheimers       Siblings: 1 sister 88, hx unknown                      2 brothers, 32, 66, HTN, hrt problems, renal cancer  Social History:    Reviewed history from 01/31/2007 and no changes required:       Married       Occupation: Theatre manager       Current Smoker       Alcohol use-yes   Risk Factors:  Tobacco use:  current    Year started:  59    Cigarettes:  Yes -- 1 pack(s) per day    Counseled to quit/cut down tobacco use:  yes Alcohol use:  yes  Family History Risk Factors:    Family History of MI in females < 59 years old:  no    Family History of MI in males < 44 years old:  no   Review of Systems      See HPI  General      Denies chills, fever, and sweats.  Resp      Denies cough, shortness of breath, sputum productive, and wheezing.  GI      Denies abdominal pain, constipation, diarrhea, nausea, and  vomiting.  GU      Denies nocturia, urinary frequency, and urinary hesitancy.   Physical Exam  General:     Well-developed,well-nourished,in no acute distress; alert,appropriate and cooperative throughout examination. Obese. Lungs:     Normal respiratory effort, chest expands symmetrically. Lungs are clear to auscultation, no crackles or wheezes. Heart:     Normal rate and regular rhythm. S1 and S2 normal without gallop, murmur, click, rub or other extra sounds. Abdomen:     Right gron shows 8 cm scar with small area of opening mid point < 5 mm.Tender. No draiange. Wears briefs and friction noted. Extremities:     No clubbing, cyanosis, edema, or deformity noted with normal full range of motion of all joints.   Neurologic:     No cranial nerve deficits noted. Station and gait are normal. Plantar reflexes are down-going bilaterally. DTRs are symmetrical throughout. Sensory, motor and coordinative functions appear intact. Cervical Nodes:     No lymphadenopathy noted Psych:     Cognition and judgment appear intact. Alert and cooperative with normal attention span and concentration. No apparent delusions, illusions, hallucinations    Impression & Recommendations:  Problem # 1:  PARESTHESIA (ICD-782.0) Equivocal Folate at 5. Advised start daily MVI as dit lacjks greens and veggies. Recheck 6 weeks. Get NCV and optomize from there. Agrees. Orders: Neurology Referral (Neuro)   Problem # 2:  INGUINAL PAIN, RIGHT (ICD-789.09) Resolved. S/P aneurysm repair. Cont risk factor optomization.  Agrees. His updated medication list for this problem includes:    Adult Aspirin Ec Low Strength 81 Mg Tbec (Aspirin) ..... One daily   Problem # 3:  SLEEP APNEA - 4CM H2O (ICD-780.57) Not using mask as cannot sleepmwith this. Nurse to call [facility] and see if they can evaluate for better fit and to see what other options he may have. Agrees.  Problem # 4:  TOBACCO ABUSE (ICD-305.1) Again  councelled.Aware of risk and benefit in quitting. Not ready. Follow.  Problem # 5:  OBESITY NOS (ICD-278.00) TLC advised. Not doing this either. Advised cannoit do for him and needs to take responsability for this. Agrees and states will try harder. Adds may as well send him to government death squad.  Problem # 6:  HYPERTENSION (ICD-401.9) See Dr.Befekadu as scheduled. Rx as below. His updated medication list for this problem includes:    Coreg Cr 40 Mg Cp24 (Carvedilol phosphate) ..... One daily    Caduet 10-20 Mg Tabs (Amlodipine-atorvastatin) ..... One daily    Diovan 320 Mg Tabs (Valsartan) ..... One daily    Catapres 0.1 Mg Tabs (Clonidine hcl) .[name]..[name] Two times a day    Labetalol Hcl 200 Mg Tabs (Labetalol hcl) ..... One two times a day   Complete Medication List: 1)  Adult Aspirin Ec Low Strength 81 Mg Tbec (Aspirin) .... One daily 2)  Coreg Cr 40 Mg Cp24 (Carvedilol phosphate) .... One daily 3)  Lexapro 10 Mg Tabs (Escitalopram oxalate) .... One daily 4)  Caduet 10-20 Mg Tabs (Amlodipine-atorvastatin) .... One daily 5)  Pantoprazole Sodium 40 Mg Tbec (Pantoprazole sodium) .... One daily 6)  Diovan 320 Mg Tabs (Valsartan) .... One daily 7)  Catapres 0.1 Mg Tabs (Clonidine hcl) .... Two times a day 8)  Labetalol  Hcl 200 Mg Tabs (Labetalol hcl) .... One two times a day  Hypertension Assessment/Plan:      The patient's hypertensive risk group is category B: At least one risk factor (excluding diabetes) with no target organ damage.  His calculated 10 year risk of coronary heart disease is 18 %.  Today's blood pressure is 153/92.  His blood pressure goal is < 125/75.  Lipid Assessment/Plan:      Based on NCEP/ATP III, the patient's risk factor category is "2 or more risk factors and a calculated 10 year CAD risk of < 20%".  From this information, the patient's calculated lipid goals are as follows: Total cholesterol goal is 200; LDL cholesterol goal is 130; HDL cholesterol goal is 40;  Triglyceride goal is 150.     Patient Instructions: 1)  Please schedule a follow-up appointment in 6 weeks for physical. Agrees.   ]  Appended Document: follow up 6 week/slj Neurology Referral scheduled with Dr. Brandon Melnick on 8 Sep at 1000.  Pt needs to registerat 945.  Advised patient of the above.

## 2010-05-03 NOTE — Progress Notes (Signed)
Summary: gout flare up  Phone Note Call from Patient   Reason for Call: Talk to Nurse Summary of Call: patient states that he has gout again and would like something called into [facility] in [place] if possible? Initial call taken by: Durwin Reges,  July 11, 2007 9:32 AM  Follow-up for Phone Call        Patient said this began yesterday in [place] ankle. He is leaving for work in about 15 minutes. Follow-up by: Druscilla Brownie,  July 11, 2007 9:51 AM  Additional Follow-up for Phone Call Additional follow up Details #1::        Advise patient last seen Nov 2008 and not seen since. He was supposed to follow-up in 6 weeks.   Advised may try OTC Ibuprofen 400 mg tid  but no med to be called in until seen. Additional Follow-up by: Weston Settle MD,  July 11, 2007 10:06 AM    Additional Follow-up for Phone Call Additional follow up Details #2::    Patient had left for work, but Yolanda Bonine said that he will relay info for patient. Advised OV Follow-up by: Druscilla Brownie,  July 11, 2007 10:16 AM

## 2010-05-03 NOTE — Letter (Signed)
Summary: Letter lab/x-ray results  Letter lab/x-ray results   Imported By: Meyer Cory 03/27/2007 16:47:09  _____________________________________________________________________  External Attachment:    Type:   Image     Comment:   External Document

## 2010-05-03 NOTE — Medication Information (Signed)
Summary: requip  requip   Imported By: Eliezer Mccoy 08/11/2008 10:10:12  _____________________________________________________________________  External Attachment:    Type:   Image     Comment:   External Document

## 2010-05-03 NOTE — Letter (Signed)
Summary: [facility]   Imported By: Wyatt Mage LPN [id number]  _____________________________________________________________________  External Attachment:    Type:   Image     Comment:   External Document

## 2010-05-03 NOTE — Letter (Signed)
Summary: Progress Note   Progress Note   Imported By: Sallee Provencal 11/01/2009 10:50:51  _____________________________________________________________________  External Attachment:    Type:   Image     Comment:   External Document

## 2010-05-03 NOTE — Progress Notes (Signed)
Summary: 08/08/07 lab results  Phone Note Outgoing Call   Call placed by: Druscilla Brownie,  Aug 15, 2007 8:59 AM Summary of Call: Results given to patient, voices understanding   Initial call taken by: Druscilla Brownie,  Aug 15, 2007 8:59 AM         Appended Document: 08/08/07 lab results lab results faxed to nephrology

## 2010-05-03 NOTE — Progress Notes (Signed)
Summary: medication  Phone Note Call from Patient   Summary of Call: patinet called in and asked for his Lexapro 10mg , and protonix 40mg  to be called into [facility] in [place], I told him I didn't see protonix on his list he said he thinks dr.vyas put him on it. I told him I would give the msg. to you. Thanks Initial call taken by: Eliezer Mccoy,  December 13, 2007 2:48 PM      Prescriptions: PANTOPRAZOLE SODIUM 40 MG  TBEC (PANTOPRAZOLE SODIUM) One daily  #30 x 3   Entered by:   Deidre Ala   Authorized by:   Weston Settle MD   Signed by:   Deidre Ala on 12/13/2007   Method used:   Electronically to        [facility] (retail)       [address]       [place], [place]  [address]       Ph: [phone]       Fax: [phone]   RxIDDT:[id number] LEXAPRO 10 MG  TABS (ESCITALOPRAM OXALATE) One daily  #30 Each x 3   Entered by:   Deidre Ala   Authorized by:   Weston Settle MD   Signed by:   Deidre Ala on 12/13/2007   Method used:   Electronically to        [facility]  [address] [facility] (retail)       [address]       [place], [place]  [address]       Ph: [phone]       Fax: [phone]   RxID:   [id number]

## 2010-05-03 NOTE — Progress Notes (Signed)
Summary: question re appt  Phone Note Call from Patient   Caller: Patient [phone] Reason for Call: Talk to Nurse Summary of Call: pt called to rs appt with dr Aundra Dubin on 10-25, only had lab work that day, he thought anne made an appt with dr Aundra Dubin that day as well, pt down on 12-6 for a four month appt-does he need to be rs? Initial call taken by: Lorenda Hatchet,  January 07, 2010 10:08 AM  Follow-up for Phone Call        [facility] Desiree Lucy, RN, BSN  January 10, 2010 8:29 AM --I talked with pt--I talked with pt about lab for L/L scheduled for 01/24/10--Pravastatin was increased 11/29/09--he was to have repeat L/L in 2 months( 01/24/10)--pt saw Dr Aundra Dubin 11/25/09 and he was going to see him in 4 months--pt has appt with Dr Aundra Dubin 03/08/10. Pt  states he need to reschedule both lab appt scheduled for 01/24/10 and appt with Dr Aundra Dubin scheduled for 03/08/10--I transferred pt to schedulers to reschedule these appointments--pt states he is confused about his medcation--he saw Dr Dennard Schaumann recently and he had lab done. He  was told to stop Diovan/HCT when he states he has not been taking.  Pt does state he is taking Klor 20 meq-two twice a day--pt and I reviewed his medications by telephone today-pt states Dr Dennard Schaumann did not mention potassium level--pt will come to our office tomorrow(he could not come today) for BMP --    New/Updated Medications: NORVASC 10 MG TABS (AMLODIPINE BESYLATE) 1 by mouth daily KLOR-CON M20 20 MEQ CR-TABS (POTASSIUM CHLORIDE CRYS CR) two in the morning and two  in the evening   Current Medications (verified): 1)  Adult Aspirin Ec Low Strength 81 Mg  Tbec (Aspirin) .... One Daily 2)  Coreg 25 Mg Tabs (Carvedilol) .... One Twice A Day 3)  Pantoprazole Sodium 40 Mg  Tbec (Pantoprazole Sodium) .... One Daily 4)  Catapres 0.1 Mg  Tabs (Clonidine Hcl) .... Two Times A Day 5)  Norvasc 10 Mg Tabs (Amlodipine Besylate) .[name].. 1 By Mouth Daily 6)  Klor-Con M20 20 Meq Cr-Tabs  (Potassium Chloride Crys Cr) .... Two in The Morning and Two  in The Evening 7)  Spironolactone 25 Mg Tabs (Spironolactone) .... One-Half Tablet Daily 8)  Pravastatin Sodium 40 Mg Tabs (Pravastatin Sodium) .... One in The Evening  Allergies: 1)  ! Sulf-10 2)  ! Bactrim Ds (Sulfamethoxazole-Trimethoprim) 3)  ! Morphine Sulfate (Morphine Sulfate)

## 2010-05-03 NOTE — Letter (Signed)
Summary: Medical History form  Medical History form   Imported By: Wyatt Mage LPN [id number]  _____________________________________________________________________  External Attachment:    Type:   Image     Comment:   External Document

## 2010-05-03 NOTE — Medication Information (Signed)
Summary: PA Request for Lamisil  PA Request for Lamisil   Imported By: Wyatt Mage 08/01/2007 14:49:40  _____________________________________________________________________  External Attachment:    Type:   Image     Comment:   External Document

## 2010-05-05 LAB — CONVERTED CEMR LAB
BUN: 17 mg/dL (ref 6–23)
CO2: 24 meq/L (ref 19–32)
Calcium: 8.8 mg/dL (ref 8.4–10.5)
Chloride: 105 meq/L (ref 96–112)
Creatinine, Ser: 1.4 mg/dL (ref 0.4–1.5)
GFR calc non Af Amer: 52.31 mL/min — ABNORMAL LOW (ref >60.00)
Glucose, Bld: 70 mg/dL (ref 70–99)
Potassium: 3.9 meq/L (ref 3.5–5.1)
Pro B Natriuretic peptide (BNP): 17.4 pg/mL (ref 0.0–100.0)
Sodium: 139 meq/L (ref 135–145)

## 2010-05-05 NOTE — Assessment & Plan Note (Signed)
Summary: per check out/sf   Visit Type:  Follow-up Referring Provider:  Dr. Dennard Schaumann Primary Provider:  Margaretmary Eddy, MD  CC:  shortness of breath and swelling in legs.  History of Present Illness: 65 yo with history of HTN, hyperlipidemia, stent graft repair of penetrating aortic ulcer, and smoking returns for cardiology followup.  Due to his exertional dyspnea and occasional chest pain, I had him do a Lexiscan myoview in 6/11.  This was concerning for moderate inferior ischemia.  He ended up having a left heart cath which showed minimal nonobstructive CAD.  He also had an echo showing normal EF with mild LV hypertrophy.   He has not been exercising much lately and weight is up 8 lbs.  His BP is uncontrolled, 186/124.  He is no longer taking valsartan-HCTZ and is not sure why he stopped it.  He does not know his medical regimen well.  He continues to work at [employer] and is on his feet a lot.  At the end of the day, he has significant lower extremity edema.  He denies exertional dyspnea or chest pain.  He is smoking about 1/2 ppd and wants to know if he can go back on Chantix (quit smoking with it in the past).    Labs (4/11): K 3.4, creatinine 1.3 Labs (6/11): BNP 34 Labs (8/11): K 3.0, creatinine 1.7 Labs (12/11): K 3.9, creatinine 1.4, BNP 17  ECG: NSR, LVH, LAFB  Current Medications (verified): 1)  Adult Aspirin Ec Low Strength 81 Mg  Tbec (Aspirin) .... One Daily 2)  Pantoprazole Sodium 40 Mg  Tbec (Pantoprazole Sodium) .... One Daily 3)  Catapres 0.1 Mg  Tabs (Clonidine Hcl) .... Two Times A Day 4)  Norvasc 10 Mg Tabs (Amlodipine Besylate) .[name].. 1 By Mouth Daily 5)  Klor-Con M20 20 Meq Cr-Tabs (Potassium Chloride Crys Cr) .... Two in The Morning and Two  in The Evening 6)  Spironolactone 25 Mg Tabs (Spironolactone) .... One-Half Tablet Daily 7)  Pravastatin Sodium 40 Mg Tabs (Pravastatin Sodium) .... One in The Evening  Allergies (verified): 1)  ! Sulf-10 2)  ! Bactrim Ds  (Sulfamethoxazole-Trimethoprim) 3)  ! Morphine Sulfate (Morphine Sulfate)  Past History:  Past Medical History: Reviewed history from 11/25/2009 and no changes required. 1. PLANTAR FASCIITIS, BILATERAL (ICD-728.71) 2. NEPHROLITHIASIS, HX OF (ICD-V13.01) 3. BACK PAIN (ICD-724.5) 4. OBESITY NOS (ICD-278.00) 5. SLEEP APNEA (ICD-780.57), patient does not tolerate CPAP 6. TOBACCO ABUSE (ICD-305.1): Normal spirometry in 3/11 7. PTSD (ICD-309.81) 8. PUD (ICD-533.90) 9. HYPERTENSION (ICD-401.9): Renal artery dopplers in 8/11 with no evidence for significant renal artery stenosis.   10. HYPERLIPIDEMIA (ICD-272.4) 11. GOUT (ICD-274.9) 12. ANXIETY (ICD-300.00) 13. BENIGN PROSTATIC HYPERTROPHY, WITH OBSTRUCTION (ICD-600.01) 14. VITAMIN B12 DEFICIENCY (ICD-266.2) 15. VITAMIN D DEFICIENCY (ICD-268.9) 16. H/O colonic polyps 17. Depression 18. Left heart cath in [place] in 2005 was negative for obstructive coronary disease per patient. Lexiscan myoview (6/11) showed possible moderate inferior ischemia.  Left heart cath (7/11) showed minimal nonobstructive coronary disease.  109. Penetrating aortic atherosclerotic ulcer s/p stent graft repair in 6/09.  20. Echo (6/11): EF 60-65%, mild LV hypertrophy, mild diastolic dysfunction, normal RV, mild aortic insufficiency.  21.  CKD  Family History: Reviewed history from 09/15/2009 and no changes required. Father: deceased, lung ca/ asbestosis/renal cancer 69 Mother: deceased  66 Alzheimers Siblings: 1 sister, hx of HTN                2 brothers, one with MI at 15.  Social History: Reviewed history from 09/15/2009 and no changes required. Married, lives between [place] and [place].  Originally from [place].  Occupation: Actor - teams - retired, Librarian, academic  Current Smoker: 1/2 ppd Alcohol use-none Lives with wife and beagles. College education but never graduated. Children - 2 sons, 1 dgt  Review of Systems       All  systems reviewed and negative except as per HPI.   Vital Signs:  Patient profile:   65 year old male Height:      68 inches Weight:      275.50 pounds BMI:     42.04 Pulse rate:   83 / minute BP sitting:   186 / 124  (left arm) Cuff size:   regular  Vitals Entered By: Hansel Feinstein CMA (March 23, 2010 1:50 PM)  Physical Exam  General:  Well developed, well nourished, in no acute distress.  Obese.  Neck:  Neck thick, no JVD. No masses, thyromegaly or abnormal cervical nodes. Lungs:  Clear bilaterally to auscultation and percussion. Heart:  Non-displaced PMI, chest non-tender; regular rate and rhythm, S1, S2 without rubs or gallops. 1/6 systolic crescendo-decrescendo early systolic murmur at RUSB.  Carotid upstroke normal, no bruit. Pedals normal pulses. 1+ edema 1/2 to knees bilaterally.   Abdomen:  Bowel sounds positive; abdomen soft and non-tender without masses, organomegaly, or hernias noted. No hepatosplenomegaly. Extremities:  No clubbing or cyanosis. Neurologic:  Alert and oriented x 3. Psych:  Normal affect.   Impression & Recommendations:  Problem # 1:  HYPERTENSION (ICD-401.9) BP is very high.  The best I can tell, he is taking clonidine, amlodipine, and spironolactone.  K and creatinine were ok when checked today.  I am going to have him go back on valsartan-HCT 160-25.  His BP had seemed to be well-controlled in the past when he was also on this regimen.  BMET and BP check in 2 wks.    Problem # 2:  CHRONIC DIASTOLIC HEART FAILURE (0000000) Lower extremity edema but neck veins not elevated and BNP normal.  Suspect lower extremity edema is mostly venous insufficiency.  No significant dyspnea.  Do not see a need for Lasix at this time.   Problem # 3:  CHRONIC KIDNEY DISEASE STAGE II (MILD) (ICD-585.2) Stable creatinine at 1.4. Follow carefully with addition of valsartan-HCT.  Problem # 4:  TOBACCO ABUSE (ICD-305.1) No CAD on recent cath.  Think it would be ok to try  Chantix again as this worked in the past.   Problem # 5:  HYPERLIPIDEMIA (ICD-272.4) Will get results of recent lipids from Dr. Samella Parr office.  Goal LDL at least < 100 given aortic atherosclerotic disease and history of penetrating ulcer.   Other Orders: TLB-BMP (Basic Metabolic Panel-BMET) (99991111) TLB-BNP (B-Natriuretic Peptide) (83880-BNPR)  Patient Instructions: 1)  Your physician has recommended you make the following change in your medication:  2)  Start Diovan HCT160/12.5mg  daily. 3)  Lab today---BMP/BNP 401.9  428.32 4)  Fasting lab in 2weeks--lipid profile/liver profile/BMP/BNP 401.9 428.32 5)  Your physician recommends that you schedule a follow-up appointment in: 1 month with Dr Aundra Dubin. Prescriptions: DIOVAN HCT 160-12.5 MG TABS (VALSARTAN-HYDROCHLOROTHIAZIDE) one daily  #30 x 6   Entered by:   Desiree Lucy, RN, BSN   Authorized by:   Loralie Champagne, MD   Signed by:   Desiree Lucy, RN, BSN on 03/23/2010   Method used:   Electronically to        [facility] (retail)  [address] [place], [place]  [address]       Ph: [phone]       Fax: [phone]   RxID:   [id number]

## 2010-06-04 ENCOUNTER — Emergency Department (HOSPITAL_COMMUNITY)
Admission: EM | Admit: 2010-06-04 | Discharge: 2010-06-04 | Disposition: A | Discharge status: Home / Self Care | Payer: Medicare Other | Attending: Emergency Medicine | Admitting: Emergency Medicine

## 2010-06-04 DIAGNOSIS — I10 Essential (primary) hypertension: Secondary | ICD-10-CM | POA: Insufficient documentation

## 2010-06-04 DIAGNOSIS — Z79899 Other long term (current) drug therapy: Secondary | ICD-10-CM | POA: Insufficient documentation

## 2010-06-04 DIAGNOSIS — M25579 Pain in unspecified ankle and joints of unspecified foot: Secondary | ICD-10-CM | POA: Insufficient documentation

## 2010-06-13 LAB — URINE CULTURE
Colony Count: NO GROWTH
Culture  Setup Time: 201112200037
Culture: NO GROWTH

## 2010-06-13 LAB — URINALYSIS, ROUTINE W REFLEX MICROSCOPIC
Bilirubin Urine: NEGATIVE
Glucose, UA: NEGATIVE mg/dL
Ketones, ur: NEGATIVE mg/dL
Leukocytes, UA: NEGATIVE
Nitrite: NEGATIVE
Protein, ur: 100 mg/dL — AB
Specific Gravity, Urine: 1.025 (ref 1.005–1.030)
Urobilinogen, UA: 0.2 mg/dL (ref 0.0–1.0)
pH: 5.5 (ref 5.0–8.0)

## 2010-06-13 LAB — URINE MICROSCOPIC-ADD ON

## 2010-06-19 LAB — DIFFERENTIAL
Basophils Absolute: 0 10*3/uL (ref 0.0–0.1)
Basophils Relative: 0 % (ref 0–1)
Eosinophils Absolute: 0.4 10*3/uL (ref 0.0–0.7)
Eosinophils Relative: 5 % (ref 0–5)
Lymphocytes Relative: 17 % (ref 12–46)
Lymphs Abs: 1.4 10*3/uL (ref 0.7–4.0)
Monocytes Absolute: 0.7 10*3/uL (ref 0.1–1.0)
Monocytes Relative: 8 % (ref 3–12)
Neutro Abs: 5.5 10*3/uL (ref 1.7–7.7)
Neutrophils Relative %: 69 % (ref 43–77)

## 2010-06-19 LAB — BASIC METABOLIC PANEL
BUN: 16 mg/dL (ref 6–23)
BUN: 17 mg/dL (ref 6–23)
BUN: 20 mg/dL (ref 6–23)
BUN: 20 mg/dL (ref 6–23)
BUN: 19 mg/dL (ref 6–23)
CO2: 26 mEq/L (ref 19–32)
CO2: 26 mEq/L (ref 19–32)
CO2: 26 mEq/L (ref 19–32)
CO2: 30 mEq/L (ref 19–32)
CO2: 26 mEq/L (ref 19–32)
Calcium: 8.1 mg/dL — ABNORMAL LOW (ref 8.4–10.5)
Calcium: 8.3 mg/dL — ABNORMAL LOW (ref 8.4–10.5)
Calcium: 8.4 mg/dL (ref 8.4–10.5)
Calcium: 8.7 mg/dL (ref 8.4–10.5)
Calcium: 8.2 mg/dL — ABNORMAL LOW (ref 8.4–10.5)
Chloride: 103 mEq/L (ref 96–112)
Chloride: 103 mEq/L (ref 96–112)
Chloride: 105 mEq/L (ref 96–112)
Chloride: 106 mEq/L (ref 96–112)
Chloride: 104 mEq/L (ref 96–112)
Creatinine, Ser: 1.36 mg/dL (ref 0.4–1.5)
Creatinine, Ser: 1.44 mg/dL (ref 0.4–1.5)
Creatinine, Ser: 1.61 mg/dL — ABNORMAL HIGH (ref 0.4–1.5)
Creatinine, Ser: 1.7 mg/dL — ABNORMAL HIGH (ref 0.4–1.5)
Creatinine, Ser: 1.59 mg/dL — ABNORMAL HIGH (ref 0.4–1.5)
GFR calc Af Amer: 49 mL/min — ABNORMAL LOW (ref >60)
GFR calc Af Amer: 53 mL/min — ABNORMAL LOW (ref >60)
GFR calc Af Amer: 60 mL/min — ABNORMAL LOW (ref >60)
GFR calc Af Amer: >60 mL/min (ref >60)
GFR calc Af Amer: 53 mL/min — ABNORMAL LOW (ref >60)
GFR calc non Af Amer: 41 mL/min — ABNORMAL LOW (ref >60)
GFR calc non Af Amer: 43 mL/min — ABNORMAL LOW (ref >60)
GFR calc non Af Amer: 49 mL/min — ABNORMAL LOW (ref >60)
GFR calc non Af Amer: 53 mL/min — ABNORMAL LOW (ref >60)
GFR calc non Af Amer: 44 mL/min — ABNORMAL LOW (ref >60)
Glucose, Bld: 100 mg/dL — ABNORMAL HIGH (ref 70–99)
Glucose, Bld: 102 mg/dL — ABNORMAL HIGH (ref 70–99)
Glucose, Bld: 110 mg/dL — ABNORMAL HIGH (ref 70–99)
Glucose, Bld: 93 mg/dL (ref 70–99)
Glucose, Bld: 92 mg/dL (ref 70–99)
Potassium: 2.7 mEq/L — CL (ref 3.5–5.1)
Potassium: 3.1 mEq/L — ABNORMAL LOW (ref 3.5–5.1)
Potassium: 3.3 mEq/L — ABNORMAL LOW (ref 3.5–5.1)
Potassium: 3.4 mEq/L — ABNORMAL LOW (ref 3.5–5.1)
Potassium: 3.3 mEq/L — ABNORMAL LOW (ref 3.5–5.1)
Sodium: 139 mEq/L (ref 135–145)
Sodium: 139 mEq/L (ref 135–145)
Sodium: 140 mEq/L (ref 135–145)
Sodium: 141 mEq/L (ref 135–145)
Sodium: 137 mEq/L (ref 135–145)

## 2010-06-19 LAB — CBC
HCT: 39.3 % (ref 39.0–52.0)
HCT: 42 % (ref 39.0–52.0)
HCT: 41.5 % (ref 39.0–52.0)
Hemoglobin: 13.3 g/dL (ref 13.0–17.0)
Hemoglobin: 14.3 g/dL (ref 13.0–17.0)
Hemoglobin: 14.4 g/dL (ref 13.0–17.0)
MCH: 30.1 pg (ref 26.0–34.0)
MCH: 30.5 pg (ref 26.0–34.0)
MCH: 31 pg (ref 26.0–34.0)
MCHC: 33.9 g/dL (ref 30.0–36.0)
MCHC: 34.1 g/dL (ref 30.0–36.0)
MCHC: 34.7 g/dL (ref 30.0–36.0)
MCV: 88.8 fL (ref 78.0–100.0)
MCV: 89.4 fL (ref 78.0–100.0)
MCV: 89.3 fL (ref 78.0–100.0)
Platelets: 202 10*3/uL (ref 150–400)
Platelets: 202 10*3/uL (ref 150–400)
Platelets: 213 10*3/uL (ref 150–400)
RBC: 4.43 MIL/uL (ref 4.22–5.81)
RBC: 4.69 MIL/uL (ref 4.22–5.81)
RBC: 4.64 MIL/uL (ref 4.22–5.81)
RDW: 14.6 % (ref 11.5–15.5)
RDW: 14.7 % (ref 11.5–15.5)
RDW: 14.6 % (ref 11.5–15.5)
WBC: 7.3 10*3/uL (ref 4.0–10.5)
WBC: 7.7 10*3/uL (ref 4.0–10.5)
WBC: 8 10*3/uL (ref 4.0–10.5)

## 2010-06-19 LAB — CARDIAC PANEL(CRET KIN+CKTOT+MB+TROPI)
CK, MB: 2.7 ng/mL (ref 0.3–4.0)
CK, MB: 3 ng/mL (ref 0.3–4.0)
Relative Index: 2.2 (ref 0.0–2.5)
Relative Index: 2.4 (ref 0.0–2.5)
Total CK: 122 U/L (ref 7–232)
Total CK: 123 U/L (ref 7–232)
Troponin I: 0.04 ng/mL (ref 0.00–0.06)
Troponin I: 0.04 ng/mL (ref 0.00–0.06)

## 2010-06-19 LAB — CK TOTAL AND CKMB (NOT AT ARMC)
CK, MB: 3.7 ng/mL (ref 0.3–4.0)
CK, MB: 3.7 ng/mL (ref 0.3–4.0)
Relative Index: 2.2 (ref 0.0–2.5)
Relative Index: 2.5 (ref 0.0–2.5)
Total CK: 150 U/L (ref 7–232)
Total CK: 165 U/L (ref 7–232)

## 2010-06-19 LAB — PROTIME-INR
INR: 1.09 (ref 0.00–1.49)
Prothrombin Time: 14 seconds (ref 11.6–15.2)

## 2010-06-19 LAB — APTT: aPTT: 41 seconds — ABNORMAL HIGH (ref 24–37)

## 2010-06-19 LAB — TROPONIN I
Troponin I: 0.05 ng/mL (ref 0.00–0.06)
Troponin I: 0.05 ng/mL (ref 0.00–0.06)

## 2010-07-08 LAB — DIFFERENTIAL
Basophils Absolute: 0.1 10*3/uL (ref 0.0–0.1)
Basophils Relative: 1 % (ref 0–1)
Eosinophils Absolute: 0.3 10*3/uL (ref 0.0–0.7)
Eosinophils Relative: 5 % (ref 0–5)
Lymphocytes Relative: 18 % (ref 12–46)
Lymphs Abs: 1.1 10*3/uL (ref 0.7–4.0)
Monocytes Absolute: 0.5 10*3/uL (ref 0.1–1.0)
Monocytes Relative: 7 % (ref 3–12)
Neutro Abs: 4.4 10*3/uL (ref 1.7–7.7)
Neutrophils Relative %: 69 % (ref 43–77)

## 2010-07-08 LAB — BASIC METABOLIC PANEL
BUN: 14 mg/dL (ref 6–23)
CO2: 27 mEq/L (ref 19–32)
Calcium: 8.8 mg/dL (ref 8.4–10.5)
Chloride: 107 mEq/L (ref 96–112)
Creatinine, Ser: 1.29 mg/dL (ref 0.4–1.5)
GFR calc Af Amer: >60 mL/min (ref >60)
GFR calc non Af Amer: 56 mL/min — ABNORMAL LOW (ref >60)
Glucose, Bld: 103 mg/dL — ABNORMAL HIGH (ref 70–99)
Potassium: 3.4 mEq/L — ABNORMAL LOW (ref 3.5–5.1)
Sodium: 140 mEq/L (ref 135–145)

## 2010-07-08 LAB — URINALYSIS, ROUTINE W REFLEX MICROSCOPIC
Bilirubin Urine: NEGATIVE
Glucose, UA: NEGATIVE mg/dL
Hgb urine dipstick: NEGATIVE
Ketones, ur: NEGATIVE mg/dL
Leukocytes, UA: NEGATIVE
Nitrite: NEGATIVE
Protein, ur: 100 mg/dL — AB
Specific Gravity, Urine: 1.02 (ref 1.005–1.030)
Urobilinogen, UA: 0.2 mg/dL (ref 0.0–1.0)
pH: 6 (ref 5.0–8.0)

## 2010-07-08 LAB — CBC
HCT: 43.2 % (ref 39.0–52.0)
Hemoglobin: 15 g/dL (ref 13.0–17.0)
MCHC: 34.6 g/dL (ref 30.0–36.0)
MCV: 87.9 fL (ref 78.0–100.0)
Platelets: 240 10*3/uL (ref 150–400)
RBC: 4.92 MIL/uL (ref 4.22–5.81)
RDW: 13.5 % (ref 11.5–15.5)
WBC: 6.3 10*3/uL (ref 4.0–10.5)

## 2010-07-08 LAB — URINE MICROSCOPIC-ADD ON

## 2010-08-16 NOTE — H&P (Signed)
[place]   NAME:[name], [name]                     MRN:          [id number]  DATE:09/27/2009                            DOB:          [birth year]    PRIMARY CARDIOLOGIST:  Dr. Aundra Dubin.   CHIEF COMPLAINT:  Chest pain.   HISTORY OF PRESENT ILLNESS:  [name] is a very pleasant 65-  year-old male with history of obesity, hypertension and atherosclerotic  ulcer, infrarenal abdominal aorta status post endograft repair who  presents to the emergency department with chest pain.  He was moving  shopping carts around at [facility] when he had the onset of dull, midsternal  pain that was at most 9/10 that resolved over approximately an hour to  0/10 by arrival.  He notes this was mildly improved with nitroglycerin,  but generally dissipated on its on.  He denies any associated  diaphoresis, dyspnea or nausea at the time.  He does have a remote  history of similar pain approximately 1 month ago though he has remained  active since without symptoms.  He reportedly saw Dr. Aundra Dubin in clinic  recently and reportedly had a stress test this past week that was  concerning for positivity.  He was seen and evaluated in the emergency  department, admitted by the hospitalist and Cardiology was consulted  based on concern for the stress test.   PAST MEDICAL HISTORY:  1. Hypertension.  2. Obesity.  3. History of atherosclerotic abdominal aortic ulcer status post      endograft repair.  4. Questionable history of dilated ascending aorta with dilated root.  5. Hyperlipidemia.  6. Tobacco abuse.   MEDICATIONS AT HOME:  1. Coreg 12.5 mg b.i.d.  2. Clonidine 0.1 mg b.i.d.  3. Protonix 40 mg p.o. daily.  4. ReQuip 0.5 mg p.o. x2 tablets nightly.  5. Pravastatin 40 mg daily.  6. Norvasc 5 mg daily.  7. Chantix recently prescribed for smoking cessation.   ALLERGIES:  MORPHINE and SULFA.   SOCIAL HISTORY:   Positive tobacco 1 pack a day times many years (40).  Denies tobacco, alcohol or drug use.  He is retired, but working at  [employer].   FAMILY HISTORY:  Does have a brother with onset of coronary artery  disease at age 23.   REVIEW OF SYMPTOMS:  A full 14-point review of symptoms negative except  as noted above in the HPI.   PHYSICAL EXAMINATION:  VITAL SIGNS:  Temperature is 97.5, blood pressure  145/80, pulse 74, respiratory rate 20, sats 90% on room air, blood  pressures ranged from 144-177 over 80-96 while here in the hospital.  GENERAL:  No acute distress.  HEENT:  Normocephalic, atraumatic.  Sclerae are anicteric.  Pupils are  equal, round and reactive to light and accommodation.  NECK:  No carotid bruits.  No evidence of JVD though his neck is thick.  CARDIAC:  Regular rate and rhythm.  No murmurs, rubs, or gallops.  LUNGS:  Long expiratory phase.  No frank rales, crackles or wheezes.  ABDOMEN:  Soft, nontender,  nondistended.  Normoactive bowel sounds.  No  clubbing, cyanosis or edema is noted.  EXTREMITIES:  He has 2+ dorsalis pedis pulses.  He has 2+ bilateral  femoral pulses and an intact right radial arch with a 2+ pulse.   Laboratory data was reviewed with a white count of 8.0, hemoglobin 14.4,  platelet count 213.  BMP reveals sodium of 137, potassium is 3.3,  chloride of 104, bicarb of 26, BUN of 19, creatinine of 1.59.  Of note,  his baseline creatinine is approximately 1.2-1.3.  His glucose was 92.  INR is normal at 1.09.  PTT is normal at 41.  Initial set of cardiac  enzymes x2 at 1850 and 2141 on June 30, they both are unremarkable.  Troponins of 0.05.  An EKG reveals flat ST segments in the lateral  segment, normal sinus rhythm at 75, left anterior fascicular block and  some baseline LVH.  Chest x-ray reveals no acute cardiopulmonary  disease.   ASSESSMENT AND PLAN:  A 65 year old white male with hypertension,  hyperlipidemia, questionable history of dilated  aortic root, history of  infrarenal atherosclerotic ulcer status post graft repair in 2009 who  presents with atypical chest pain.  He was recently seen by Dr. Aundra Dubin  and apparently these records are not available for my review.  Reportedly this included a positive Myoview stress test, but the patient  is unclear where or when.  1. Agree with admission for rule out myocardial infarction.  His      troponins negative x2 sets at the present time and his pain is      somewhat atypical although he has occasional typical features.  I      agree with plan and care including continued aspirin, beta-blocker,      calcium channel blocker and Norvasc aggressively to control his      blood pressure.  I would defer any further anticoagulation      including ADP blockade such as Plavix or antithrombotic agents such      as heparin.  He has no role for a glycoprotein to be IIIa inhibitor      such as Integrilin.  We will discuss the case with Dr. Aundra Dubin.  He      may warrant left heart catheterization to further define his      coronary anatomy.  We will keep him n.p.o. after midnight.  2. Smoking, strongly encouraged cessation, agree with plans for      Chantix.  3. Acute kidney injury.  He does have baseline chronic kidney disease,      but his baseline appears closer to 1.2-1.3, so his creatinine of      1.6 is mildly up.  We will plan for gentle IV hydration and empiric      acetylcysteine precatheterization, should this be the plan.  4. Hypertension, as above.  Antihypertensives with a goal blood      pressure of 123456 systolic.  5. Hyperlipidemia.  Plan to continue his baseline pravastatin.  Also,      we would like to try      nutrition, replace his potassium to goal of 4.0.  We are pending      magnesium at the present time.  We appreciate this consultation.     Kerry Hough, MD    PMB/MedQ  DD: 10/01/2009  DT: 10/01/2009  Job #: [id number]   cc:   Dr. Aundra Dubin

## 2010-08-16 NOTE — Op Note (Signed)
NAMEMarland Kitchen  [name], [name] NO.:  [id number]   MEDICAL RECORD NO.:  [id number]          PATIENT TYPE:  INP   LOCATION:  [id number]                         FACILITY:  [facility]   PHYSICIAN:  Dorothea Glassman, M.D.    DATE OF BIRTH:  1945-06-04   DATE OF PROCEDURE:  09/13/2007  DATE OF DISCHARGE:                               OPERATIVE REPORT   SURGEON:  Dorothea Glassman, MD   ASSISTANT:  1. Leia Alf, MD   ANESTHETIC:  General endotracheal.   ANESTHESIOLOGIST:  Jessy Oto. Albertina Parr, MD   PREOPERATIVE DIAGNOSES:  1. Penetrating atherosclerotic ulcer infrarenal abdominal aorta.  2. Back pain.   POSTOPERATIVE DIAGNOSES:  1. Penetrating atherosclerotic ulcer infrarenal abdominal aorta.  2. Back pain.   PROCEDURE:  1. Closure of penetrating atherosclerotic ulcer infrarenal aorta with      endovascular stent graft (Endologix Powerlink bifurcated stent      graft 25 x 16 x 120, proximal cuff 25 x 25 x 75).  2. Ultrasound guided percutaneous access left common femoral artery.   CLINICAL NOTE:  [name] is a 65 year old male referred with  evidence of a penetrating atherosclerotic ulcer of the infrarenal aorta  by MRI.  He complained of subacute back pain associated with this  primarily in the left flank.  He was brought to the operating room at  this time for covering of this atherosclerotic ulcer with an  endovascular stent graft.   PROCEDURE NOTE:  The patient was brought to the cath lab in stable  condition.  Placed in supine position.  General endotracheal anesthesia  induced.  Foley catheter, arterial lines, and central venous catheter  placed.  In the supine position, the abdomen and both legs prepped and  draped in a sterile fashion.   An oblique skin incision was made in the right groin over the common  femoral artery.  Subcutaneous tissue divided with electrocautery.  Lymphatics divided.  Branching veins were ligated with 3-0 silk and  divided.  The right  common femoral artery was exposed.  This was  mobilized up to the inguinal ligament and encircled proximally with a  vessel loop.  There was evidence of a previous catheterization in the  right groin with a closure device present which appeared to be a  Perclose.  This was in the anterior wall of the right common femoral  artery.  The right common femoral artery then mobilized down to the  origin of the profunda and superficial femoral arteries and encircled  with a vessel loop.  The right common femoral artery was soft with an  excellent pulse.  The right common femoral artery was then accessed with  an 18-gauge needle and a 0.035 Bentson guidewire was advanced up into  the abdominal aorta.  The needle removed and a 12-French sheath was  advanced over the guidewire into the right iliac system.   Ultrasound imaging of the left common femoral artery was then carried  out. The left common femoral artery identified and with ultrasound  guidance was accessed with an 18-gauge needle, 0.035 Bentson guidewire  advanced through  the needle into the mid abdominal aorta.  The needle  was removed and the site opened with an 11 blade.  A 9-French sheath was  then advanced over the guidewire into the left common femoral artery and  up into left iliac system.   A graduated Omni flush catheter was then advanced over the contralateral  left femoral to guidewire and into the abdominal aorta into the  juxtarenal position.  An abdominal aortogram was obtained.  This  revealed the infrarenal aorta to measure 10 cm from the lowermost renal  artery origin to the bifurcation.   An initial bifurcated device Endologix Powerlink 25 x 16 x 120 was  chosen.   A snare sheath was then advanced over the ipsilateral right femoral  guidewire into the abdominal aorta.  The Bentson guidewire removed and  the snare passed through the sheath into the abdominal aorta.  Using the  snare device, the contralateral guidewire  was brought across and out the  ipsilateral right femoral sheath.  A dual-lumen catheter was then  advanced from the contralateral to the ipsilateral side.  An Amplatz  super stiff guidewire was advanced through the second channel of the  dual-lumen catheter into the abdominal and descending thoracic aorta.  A  Powerlink 25 x 16 x 120 device was loaded onto the wire and the SurePass  wire was advanced through the dual-lumen catheter to the contralateral  groin.  The 12-French sheath was then brought out of the right of the  common femoral artery with tearaway and the tearway device was removed  and the Powerlink system was advanced into the abdominal aorta under  fluoroscopy.  The distal limbs were advanced above the bifurcation.  The  limbs were then released by retracting the outer sheath and the entire  system was brought down onto the aortic bifurcation.  The device was  deployed by pulling the controlled cord which deployed the ipsilateral  limb.  The contralateral limb was then accessed with a 0.014 guidewire  through the SurePass contralateral limb wire.  The contralateral limb  was then deployed into the left iliac system.   A post deployment ballooning of the device was then carried out on the  ipsilateral side with a 30 CODA balloon and on the contralateral side  with a 10 x 4 Powerflex balloon dilator.  The balloon was deployed in  the left limb of the graft and the CODA balloon was inflated throughout  the length of the body of the graft in the right limb.   A completion arteriogram was then obtained.  This did reveal still some  flow of contrast into the ulcerated area.  Therefore, a proximal cuff  was deployed.  This was loaded onto the ipsilateral guidewire, advanced  up to the juxtarenal position and with contrast injection was deployed  at the origin of the lowermost right renal artery.  This was brought  down into the main body of the device and completely deployed.   The  delivery catheter removed and the CODA balloon again used to seal the  proximal cuff to the aorta and to the main body of the device.   The Omni flush catheter was then advanced through the device into the  juxtarenal position and a completion arteriogram obtained.  This  revealed no evidence of type 1 leak.  There was still some filling of  the ulcerated area, this appeared possibly due to a type 2 lumbar.  There was no pulsatile flow evident  in the ulcerated area.   A hand injection of contrast was then made through the left femoral  sheath to assure that this was in the common femoral artery which it  was.  The guidewire left in place and the left femoral sheath removed  and a StarClose sheath was advanced over the guidewire into the left  common femoral artery.  The dilator and guidewire were removed.  The  StarClose device then advanced through the sheath and clicked in place.  The device then loaded and brought back to the arterial wall and  deployed.  There were no apparent complications and hemostasis was  obtained.   In the right groin, the guidewires were removed, the right femoral  sheath removed and the right femoral vessels flushed and then controlled  with DeBakey clamps.  A transverse arteriotomy closure was carried out  with running 5-0 Prolene suture.  The vessels then flushed.  Clamps  removed.  Excellent flow present.   The patient administered 30 mg of protamine intravenously.  There was  some hypotension evident with protamine administration and therefore,  slow administration was carried out and this responded to bolus fluid  administration.   There were no apparent complications.  The right groin was then closed  with running 2-0 Vicryl suture in two subcutaneous layers, 4-0 Monocryl  one on the skin, Dermabond for the skin.   The patient tolerated the procedure well.  Posterior tibial and dorsalis  pedis pulses 2+ at the termination of the procedure.   Transferred to the  recovery room in stable condition.      Dorothea Glassman, M.D.  Electronically Signed     PGH/MEDQ  D:  09/13/2007  T:  09/14/2007  Job:  [id number]

## 2010-08-16 NOTE — Assessment & Plan Note (Signed)
OFFICE VISIT   [name], [name]  DOB:  October 29, 1945                                       02/28/2010  [id number]   The patient comes back today for followup of his endovascular repair of  his penetrating ulcer.  The patient is a former patient of Dr. [name].  He underwent aortic stent graft for penetrating atherosclerotic ulcer on  09/13/2007.  He had an unremarkable recovery.  It has been a while since  his aneurysm has been imaged with CT scan.  I had it repeated for today.  The patient has no new symptoms, no back pain, no abdominal pain.  He  denies any claudication like symptoms.   PHYSICAL EXAMINATION:  Vital signs:  His heart rate is 72, blood  pressure 143/92, respiratory rate 16.  General:  He is well-appearing,  in no distress.  HEENT:  Within normal limits.  Respirations:  Nonlabored.  Abdomen:  Soft, nontender.  Extremities:  Warm and well-  perfused.   DIAGNOSTIC STUDIES:  I reviewed the CT scan.  The stent graft is in good  position.  There is no evidence of leak.  The penetrating ulcer has  resolved.   ASSESSMENT:  Status post endovascular repair of penetrating  atherosclerotic ulcer.   PLAN:  The patient is doing well at this time.  I will get an abdominal  ultrasound in 1 year to make sure his stent graft is in good position  without leak.  Again, I will see him back in a year.     Eldridge Abrahams, MD  Electronically Signed   VWB/MEDQ  D:  02/28/2010  T:  03/01/2010  Job:  [id number]

## 2010-08-16 NOTE — Assessment & Plan Note (Signed)
OFFICE VISIT   [name], [name]  DOB:  [birth year]                                       04/30/2008  [id number]   The patient underwent placement of an aortic stent graft for penetrating  atherosclerotic ulcer on 09/13/2007 at [facility].  He had an  unremarkable postoperative recovery.  He has no complaints at this time.   He has undergone a CT scan which reveals his native infrarenal aorta  measured 2.7 cm.  No further filling of atherosclerotic ulcer.   CURRENT MEDICATIONS:  1. Include Pantoprazole 40 mg daily.  2. Caduet 10/20 daily.  3. Flomax 0.4 mg daily.  4. Labetalol 200 mg daily.  5. Coreg CR 40 mg daily.  6. Vitamin D 50,000 international units daily.   PHYSICAL EXAMINATION:  General:  The patient appears well.  Alert and  oriented.  No distress.  Vital signs:  BP 172/118, pulse is 85 per  minute.  Abdomen:  His abdomen is soft and nontender.  No masses or  organomegaly.  Normal bowel sounds.  Vascular:  2+ femoral pulses  bilaterally.   The patient has done well following his aortic stent graft procedure.  We will plan followup with him again in 6 months with an ultrasound of  the abdomen.   Dorothea Glassman, M.D.  Electronically Signed   PGH/MEDQ  D:  04/30/2008  T:  05/01/2008  Job:  [id number]   cc:   Weston Settle, M.D.

## 2010-08-16 NOTE — Procedures (Signed)
LOWER EXTREMITY ARTERIAL EVALUATION-SINGLE LEVEL   INDICATION:  Followup evaluation of AAA repair.   HISTORY:  Diabetes:  No.  Cardiac:  No.  Hypertension:  Yes.  Smoking:  Yes, 1/2 pack per day.  Previous Surgery:  Aortic stent on 09/13/2007 by Dr. [name].   RESTING SYSTOLIC PRESSURES: (ABI)                          RIGHT                LEFT  Brachial:               158                  160  Anterior tibial:        200                  190  Posterior tibial:       204 (1.28)           194 (1.21)  Peroneal:  DOPPLER WAVEFORM ANALYSIS:  Anterior tibial:        Triphasic            Biphasic  Posterior tibial:       Triphasic            Triphasic  Peroneal:   PREVIOUS ABI'S:  Date:  RIGHT:  LEFT:   IMPRESSION:  Bilateral ABIs and waveforms are normal, however, increased  ABIs may be due to medial arterial calcification.   ___________________________________________  P. Drucie Opitz, M.D.   PB/MEDQ  D:  10/10/2007  T:  10/10/2007  Job:  [id number]

## 2010-08-16 NOTE — Op Note (Signed)
NAME:  [name], [name]              ACCOUNT NO.:  [id number]   MEDICAL RECORD NO.:  [id number]          PATIENT TYPE:  AMB   LOCATION:  DAY                           FACILITY:  [facility]   PHYSICIAN:  R. Garfield Cornea, M.D. DATE OF BIRTH:  [birth year]   DATE OF PROCEDURE:  08/12/2008  DATE OF DISCHARGE:                               OPERATIVE REPORT   INDICATIONS FOR PROCEDURE:  A 65 year old gentleman who reportedly had  multiple colonic polyps taken out of his colon count in [place] some  6-7 years ago.  He presents for surveillance at this time.  He is not  having any lower GI tract symptoms.  Risks, benefits, alternatives,  limitations have been discussed previously and again at the bedside.  Please see documentation in the medical record.   PROCEDURE NOTE:  O2 saturation, blood pressure, pulse, respirations were  monitored throughout the entirety of the procedure.   CONSCIOUS SEDATION:  Versed 6 mg IV, Demerol 100 mg IV in divided doses.   INSTRUMENT:  Pentax video chip system.   FINDINGS:  Digital rectal exam revealed no abnormalities.  The prep was  adequate.  Colon:  Colonic mucosa was surveyed from the rectosigmoid  junction through the left transverse and right colon to the appendiceal  orifice, ileocecal valve and cecum.  These structures were well-seen and  photographed for the record.  From this level the scope was slowly and  cautiously withdrawn.  All previously-mentioned mucosal surfaces were  again seen.  The patient had numerous colonic polyps about the hepatic  flexure, largest one was 8 mm and was pedunculated.  Multiple hot snare  polypectomies were performed.  There was a diminutive polyp in the mid-  ascending colon which was ablated with the tip of the cautery loop.  All  polyps seen were either ablated or resected.  Four polyps were snared,  one was ablated.  The remainder of the colonic mucosa appeared  unremarkable.  Scope was pulled down to the rectum where  thorough  examination of the rectal mucosa, including retroflexed view of the anal  verge, demonstrated three 5-mm pedunculated polyps which were all hot-  snared.  Aside from anal papillae, the rectal mucosa appeared normal.  The patient tolerated the procedure very well and was reacted in  endoscopy with cecal withdrawal time 15 minutes.   IMPRESSION:  1. Anal papillae and multiple small rectal polyps, status post snare      polypectomy as described above.  2. Multiple colonic polyps about the hepatic flexure and ascending      segment, status post multiple hot-snare polypectomies and polyp      ablation, as described above.   RECOMMENDATIONS:  1. No aspirin for the next 5 days.  2. Follow up on path.  3. Further recommendations to follow.      Bridgette Habermann, M.D.  Electronically Signed     RMR/MEDQ  D:  08/12/2008  T:  08/12/2008  Job:  [id number]   cc:   Weston Settle, M.D.

## 2010-08-16 NOTE — Assessment & Plan Note (Signed)
OFFICE VISIT   [name], [name]  DOB:  March 08, 1946                                       10/10/2007  [id number]   The patient underwent placement of aortic stent graft for a penetrating  atherosclerotic ulcer of the infrarenal aorta 4 weeks ago.  He had an  unremarkable postoperative recovery.  He presents today for first  postoperative visit.   His lower extremity Dopplers are normal.  CT scan reveals closure of the  area of ulceration in the infrarenal aorta.  The stent graft is in good  position without complicating features.   The presents appears well.  BP 169/112, pulse 65 per minute.  His  abdomen is soft, nontender.  No masses or organomegaly.  2+ femoral  pulses bilaterally.  Right groin incision healing unremarkably.   The patient is doing well following his stent graft placement.  We will  plan followup with him again in 6 months with a CT scan.   Dorothea Glassman, M.D.  Electronically Signed   PGH/MEDQ  D:  10/10/2007  T:  10/11/2007  Job:  [id number]   cc:   Weston Settle, M.D.

## 2010-08-16 NOTE — Consult Note (Signed)
VASCULAR SURGERY CONSULTATION   [name], [name]  DOB:  03/18/1946                                       08/15/2007  CHART#:[id number]   REASON FOR CONSULTATION:  Penetrating atherosclerotic ulcer of the  infrarenal abdominal aorta.   HISTORY:  The patient is a 65 year old gentleman who underwent an MRA of  the renal arteries for poorly controlled hypertension with a history of  obesity and tobacco abuse.  An incidental finding revealed a penetrating  atherosclerotic ulcer of the infrarenal abdominal aorta.   REFERRING PHYSICIAN:  The patient has been free of any apparent related  symptoms.  Denies abdominal pain or back pain.  No lower extremity pain.   No claudication symptoms.   Risk factors for atherosclerotic disease include hypertension, tobacco  abuse, and hyperlipidemia.  He has no history of atherosclerotic heart  disease, stroke, TIA, or peripheral vascular disease.   PAST MEDICAL HISTORY:  1. Hyperlipidemia.  2. Hypertension.  3. Gout.  4. Anxiety.   PAST SURGICAL HISTORY:  1. Umbilical hernia repair.  2. Spontaneous pneumothorax with chest tube.  3. Kidney stones.  4. Fractured arm.   MEDICATIONS:  Aspirin 81 mg daily, Coreg CR 40 mg daily, allopurinol 100  mg daily, Lexapro 10 mg daily, Caduet 10/20 one tablet daily, Protonix  40 mg daily, Diovan 160 mg daily, and colchicine.   ALLERGIES:  None known.   FAMILY HISTORY:  Father deceased at age 38 with a history of lung CA and  asbestos exposure.  Mother deceased age 63 with Alzheimer's disease.  One sister age 39 with an unremarkable medical history.  Two brothers  and 49 and 33 have hypertension heart disease and one has a history of  renal cell cancer.   SOCIAL HISTORY:  The patient is married, three grown children.  Smokes  one-half to one pack of cigarettes daily.  Drinks alcohol on a social  basis.  Continues to work as a Chiropractor.   REVIEW OF SYSTEMS:  Refer to patient encounter form, this was reviewed today.  The patient  notes shortness of breath with exertion.  Also some GI reflux symptoms.  Further 12 system review is negative.   PHYSICAL EXAM:  General:  Well-developed, well-nourished male appeared  alert and oriented.  No acute distress.  Moderate obesity.  Vital signs:  BP is 164/119, pulse 70 per minute, respirations 18 per minute.  HEENT:  Mouth and throat clear.  Normocephalic.  Extraocular nerves intact.  Neck:  Supple.  No thyromegaly, adenopathy.  Chest:  Normal respiratory  effort.  Equal air entry.  No rales or rhonchi.  Normal percussion.  Cardiovascular:  No carotid bruits.  Normal heart sounds without  murmurs.  No gallops or rubs.  Regular rate and rhythm.  Abdomen:  Soft,  nontender.  No masses or organomegaly.  Bowel sounds active, no bruits.  Extremities:  No clubbing, cyanosis or peripheral edema.  Full range of  motion all joints.  Pulses are 2+ femoral, popliteal, dorsalis pedis,  posterior tibial bilaterally.  Skin:  Warm, dry, intact.  Neurologic:  Cranial nerves intact.  Strength equal bilaterally.  Normal reflexes.   INVESTIGATIONS:  MR angiography of the abdomen reviewed.  Bolus renal  artery stenosis.  Penetrating atherosclerotic ulcer noted in the distal  infrarenal aorta.  Incidental renal and liver cyst  noted.   IMPRESSION:  1. Penetrating atherosclerotic ulcer infrarenal aorta.  2. Hypertension.  3. Hyperlipidemia.  4. Tobacco abuse.   RECOMMENDATIONS:  The patient will be measured for potential placement  of an aortic stent graft to cover this area penetrating atherosclerotic  ulcer in the infrarenal aorta.   Patient counseled regarding cessation of smoking.   Dorothea Glassman, M.D.  Electronically Signed  PGH/MEDQ  D:  08/22/2007  T:  08/23/2007  Job:  [id number]   cc:   Weston Settle, M.D.

## 2010-08-16 NOTE — Procedures (Signed)
AORTA-ILIAC DUPLEX EVALUATION   INDICATION:  Followup evaluation of abdominal aortic stent.  The patient  complains of 2-3 months of bilateral leg pain at night.   HISTORY:  Diabetes:  No.  Cardiac:  No.  Hypertension:  Yes.  Smoking:  Half pack per day.  Previous Surgery:  Aortic stent placed on 09/13/2007 by Dr. [name] for an  infrarenal aortic atherosclerotic stenosis.               SINGLE LEVEL ARTERIAL EXAM                              RIGHT                  LEFT  Brachial:                  178                    170  Anterior tibial:           200                    168  Posterior tibial:          210                    196  Peroneal:  Ankle/brachial index:      >1.0                   >1.0  Previous ABI/date:         10/10/2007 >1.0        10/10/2007 >1.0   AORTA-ILIAC DUPLEX EXAM  Aorta - Proximal     75 cm/s  Aorta - Mid          66 cm/s  Aorta - Distal       95 cm/s   RIGHT                                   LEFT  100 cm/s          CIA-PROXIMAL          172 cm/s  cm/s              CIA-DISTAL            cm/s  cm/s              HYPOGASTRIC           cm/s  cm/s              EIA-PROXIMAL          cm/s  cm/s              EIA-MID               cm/s  cm/s              EIA-DISTAL            cm/s   IMPRESSION:  1. Ankle brachial indices and Doppler arterial waveforms are stable      compared to previous study and suggest no significant arterial      occlusive disease bilaterally.  2. Imaging revealed triphasic Doppler arterial waveforms throughout      the distal abdominal aorta and common iliac arteries bilaterally  with no evidence of stenosis.   ___________________________________________  P. Drucie Opitz, M.D.   MC/MEDQ  D:  10/29/2008  T:  10/29/2008  Job:  [id number]

## 2010-11-15 ENCOUNTER — Other Ambulatory Visit: Payer: Self-pay | Admitting: Cardiology

## 2010-11-28 ENCOUNTER — Other Ambulatory Visit: Payer: Self-pay | Admitting: Cardiology

## 2010-12-27 ENCOUNTER — Other Ambulatory Visit: Payer: Self-pay | Admitting: Cardiology

## 2010-12-29 LAB — URINALYSIS, ROUTINE W REFLEX MICROSCOPIC
Bilirubin Urine: NEGATIVE
Glucose, UA: NEGATIVE
Hgb urine dipstick: NEGATIVE
Ketones, ur: NEGATIVE
Leukocytes, UA: NEGATIVE
Nitrite: NEGATIVE
Protein, ur: 100 — AB
Specific Gravity, Urine: 1.018
Urobilinogen, UA: 0.2
pH: 6

## 2010-12-29 LAB — ABO/RH: ABO/RH(D): B POS

## 2010-12-29 LAB — COMPREHENSIVE METABOLIC PANEL
ALT: 19
AST: 18
Albumin: 4
Alkaline Phosphatase: 72
BUN: 13
CO2: 25
Calcium: 9
Chloride: 106
Creatinine, Ser: 1.34
GFR calc Af Amer: >60
GFR calc non Af Amer: 54 — ABNORMAL LOW
Glucose, Bld: 102 — ABNORMAL HIGH
Potassium: 3.6
Sodium: 140
Total Bilirubin: 0.7
Total Protein: 6.6

## 2010-12-29 LAB — BASIC METABOLIC PANEL
BUN: 10
CO2: 25
Calcium: 7.5 — ABNORMAL LOW
Chloride: 103
Creatinine, Ser: 1.22
GFR calc Af Amer: >60
GFR calc non Af Amer: >60
Glucose, Bld: 130 — ABNORMAL HIGH
Potassium: 3.1 — ABNORMAL LOW
Sodium: 135

## 2010-12-29 LAB — CBC
HCT: 34 — ABNORMAL LOW
HCT: 42.8
Hemoglobin: 11.8 — ABNORMAL LOW
Hemoglobin: 14.8
MCHC: 34.6
MCHC: 34.7
MCV: 86.9
MCV: 87.2
Platelets: 204
Platelets: 273
RBC: 3.9 — ABNORMAL LOW
RBC: 4.92
RDW: 13.9
RDW: 14.3
WBC: 6.2
WBC: 9.3

## 2010-12-29 LAB — URINE MICROSCOPIC-ADD ON

## 2010-12-29 LAB — TYPE AND SCREEN
ABO/RH(D): B POS
Antibody Screen: NEGATIVE

## 2010-12-29 LAB — PROTIME-INR
INR: 1.1
Prothrombin Time: 13.9

## 2010-12-29 LAB — APTT: aPTT: 39 — ABNORMAL HIGH

## 2011-01-27 ENCOUNTER — Other Ambulatory Visit: Payer: Self-pay | Admitting: Cardiology

## 2011-01-30 ENCOUNTER — Ambulatory Visit (INDEPENDENT_AMBULATORY_CARE_PROVIDER_SITE_OTHER): Payer: Medicare Other | Admitting: *Deleted

## 2011-01-30 DIAGNOSIS — I714 Abdominal aortic aneurysm, without rupture, unspecified: Secondary | ICD-10-CM

## 2011-01-30 DIAGNOSIS — I700 Atherosclerosis of aorta: Secondary | ICD-10-CM

## 2011-01-30 DIAGNOSIS — Z48812 Encounter for surgical aftercare following surgery on the circulatory system: Secondary | ICD-10-CM

## 2011-01-31 NOTE — Telephone Encounter (Signed)
Phar has a question to see if they can use generic.  Medication is Diovan 160/12.5  Please call Deardra at [facility]   [phone]

## 2011-02-14 ENCOUNTER — Encounter: Payer: Self-pay | Admitting: Surgery

## 2011-02-14 NOTE — Procedures (Unsigned)
VASCULAR LAB EXAM  INDICATION:  Followup AAA endograft placed 09/13/2007  HISTORY: Diabetes:  No Cardiac:  No Hypertension:  Yes History of atherosclerotic ulcer infrarenal aorta  EXAM:  AAA sac size 2.7 cm AP.  Previous sac size 04/30/2008 2.7 cm AP.  IMPRESSION: 1. The aorta and endograft appear patent. 2. No significant change in size of the aneurysmal sac surrounding the     endograft. 3. No evidence of endoleak was detected. 4. Technically difficult study due to patient body habitus and limited     visualization due to patient smoking many cigarettes this morning     prior to exam.  ___________________________________________ V. Leia Alf, MD  EM/MEDQ  D:  01/30/2011  T:  01/30/2011  Job:  [id number]

## 2011-02-17 ENCOUNTER — Other Ambulatory Visit: Payer: Self-pay | Admitting: Vascular Surgery

## 2011-02-17 DIAGNOSIS — I714 Abdominal aortic aneurysm, without rupture, unspecified: Secondary | ICD-10-CM

## 2011-02-17 DIAGNOSIS — Z48812 Encounter for surgical aftercare following surgery on the circulatory system: Secondary | ICD-10-CM

## 2011-04-04 HISTORY — PX: KNEE ARTHROSCOPY: SUR90

## 2011-04-15 ENCOUNTER — Other Ambulatory Visit: Payer: Self-pay | Admitting: Cardiology

## 2011-05-02 DIAGNOSIS — L821 Other seborrheic keratosis: Secondary | ICD-10-CM | POA: Diagnosis not present

## 2011-05-02 DIAGNOSIS — R059 Cough, unspecified: Secondary | ICD-10-CM | POA: Diagnosis not present

## 2011-05-11 DIAGNOSIS — M25579 Pain in unspecified ankle and joints of unspecified foot: Secondary | ICD-10-CM | POA: Diagnosis not present

## 2011-05-11 DIAGNOSIS — M79609 Pain in unspecified limb: Secondary | ICD-10-CM | POA: Diagnosis not present

## 2011-05-22 ENCOUNTER — Other Ambulatory Visit: Payer: Self-pay | Admitting: Cardiology

## 2011-05-25 DIAGNOSIS — M25579 Pain in unspecified ankle and joints of unspecified foot: Secondary | ICD-10-CM | POA: Diagnosis not present

## 2011-05-25 DIAGNOSIS — M79609 Pain in unspecified limb: Secondary | ICD-10-CM | POA: Diagnosis not present

## 2011-06-01 DIAGNOSIS — N410 Acute prostatitis: Secondary | ICD-10-CM | POA: Diagnosis not present

## 2011-06-01 DIAGNOSIS — N400 Enlarged prostate without lower urinary tract symptoms: Secondary | ICD-10-CM | POA: Diagnosis not present

## 2011-06-28 ENCOUNTER — Other Ambulatory Visit: Payer: Self-pay | Admitting: Cardiovascular Disease

## 2011-06-28 NOTE — Telephone Encounter (Signed)
Patient needs appt

## 2011-07-01 ENCOUNTER — Other Ambulatory Visit: Payer: Self-pay | Admitting: Cardiovascular Disease

## 2011-07-27 DIAGNOSIS — M109 Gout, unspecified: Secondary | ICD-10-CM | POA: Diagnosis not present

## 2011-07-27 DIAGNOSIS — M79609 Pain in unspecified limb: Secondary | ICD-10-CM | POA: Diagnosis not present

## 2011-08-22 DIAGNOSIS — IMO0002 Reserved for concepts with insufficient information to code with codable children: Secondary | ICD-10-CM | POA: Diagnosis not present

## 2011-08-22 DIAGNOSIS — Z125 Encounter for screening for malignant neoplasm of prostate: Secondary | ICD-10-CM | POA: Diagnosis not present

## 2011-08-22 DIAGNOSIS — M109 Gout, unspecified: Secondary | ICD-10-CM | POA: Diagnosis not present

## 2011-08-22 DIAGNOSIS — N400 Enlarged prostate without lower urinary tract symptoms: Secondary | ICD-10-CM | POA: Diagnosis not present

## 2011-09-01 ENCOUNTER — Ambulatory Visit (INDEPENDENT_AMBULATORY_CARE_PROVIDER_SITE_OTHER): Payer: Medicare Other | Admitting: Urology

## 2011-09-01 DIAGNOSIS — IMO0002 Reserved for concepts with insufficient information to code with codable children: Secondary | ICD-10-CM

## 2011-09-01 DIAGNOSIS — N476 Balanoposthitis: Secondary | ICD-10-CM

## 2011-09-01 DIAGNOSIS — N471 Phimosis: Secondary | ICD-10-CM

## 2011-09-01 DIAGNOSIS — N138 Other obstructive and reflux uropathy: Secondary | ICD-10-CM | POA: Diagnosis not present

## 2011-09-01 DIAGNOSIS — Z0000 Encounter for general adult medical examination without abnormal findings: Secondary | ICD-10-CM | POA: Diagnosis not present

## 2011-09-01 DIAGNOSIS — N478 Other disorders of prepuce: Secondary | ICD-10-CM | POA: Diagnosis not present

## 2011-09-01 DIAGNOSIS — R972 Elevated prostate specific antigen [PSA]: Secondary | ICD-10-CM

## 2011-09-01 DIAGNOSIS — N403 Nodular prostate with lower urinary tract symptoms: Secondary | ICD-10-CM | POA: Diagnosis not present

## 2011-09-04 ENCOUNTER — Other Ambulatory Visit: Payer: Self-pay | Admitting: Urology

## 2011-09-14 ENCOUNTER — Encounter (HOSPITAL_COMMUNITY)
Admission: RE | Admit: 2011-09-14 | Discharge: 2011-09-14 | Disposition: A | Discharge status: Home / Self Care | Payer: Medicare Other | Source: Ambulatory Visit | Attending: Urology | Admitting: Urology

## 2011-09-14 ENCOUNTER — Ambulatory Visit (HOSPITAL_COMMUNITY)
Admission: RE | Admit: 2011-09-14 | Discharge: 2011-09-14 | Disposition: A | Discharge status: Home / Self Care | Payer: Medicare Other | Source: Ambulatory Visit | Attending: Urology | Admitting: Urology

## 2011-09-14 ENCOUNTER — Encounter (HOSPITAL_COMMUNITY): Payer: Self-pay

## 2011-09-14 ENCOUNTER — Encounter (HOSPITAL_COMMUNITY): Payer: Self-pay | Admitting: Pharmacy Technician

## 2011-09-14 DIAGNOSIS — Z01810 Encounter for preprocedural cardiovascular examination: Secondary | ICD-10-CM | POA: Diagnosis not present

## 2011-09-14 DIAGNOSIS — N35919 Unspecified urethral stricture, male, unspecified site: Secondary | ICD-10-CM | POA: Insufficient documentation

## 2011-09-14 DIAGNOSIS — Z01812 Encounter for preprocedural laboratory examination: Secondary | ICD-10-CM | POA: Insufficient documentation

## 2011-09-14 DIAGNOSIS — I440 Atrioventricular block, first degree: Secondary | ICD-10-CM | POA: Diagnosis not present

## 2011-09-14 DIAGNOSIS — R0602 Shortness of breath: Secondary | ICD-10-CM | POA: Diagnosis not present

## 2011-09-14 DIAGNOSIS — R972 Elevated prostate specific antigen [PSA]: Secondary | ICD-10-CM | POA: Insufficient documentation

## 2011-09-14 DIAGNOSIS — Z01818 Encounter for other preprocedural examination: Secondary | ICD-10-CM | POA: Diagnosis not present

## 2011-09-14 DIAGNOSIS — N471 Phimosis: Secondary | ICD-10-CM | POA: Insufficient documentation

## 2011-09-14 DIAGNOSIS — R911 Solitary pulmonary nodule: Secondary | ICD-10-CM | POA: Diagnosis not present

## 2011-09-14 DIAGNOSIS — N478 Other disorders of prepuce: Secondary | ICD-10-CM | POA: Insufficient documentation

## 2011-09-14 DIAGNOSIS — I10 Essential (primary) hypertension: Secondary | ICD-10-CM | POA: Insufficient documentation

## 2011-09-14 DIAGNOSIS — Z01811 Encounter for preprocedural respiratory examination: Secondary | ICD-10-CM | POA: Diagnosis not present

## 2011-09-14 HISTORY — DX: Gastro-esophageal reflux disease without esophagitis: K21.9

## 2011-09-14 HISTORY — DX: Gout, unspecified: M10.9

## 2011-09-14 LAB — BASIC METABOLIC PANEL
BUN: 20 mg/dL (ref 6–23)
CO2: 23 mEq/L (ref 19–32)
Calcium: 8.9 mg/dL (ref 8.4–10.5)
Chloride: 105 mEq/L (ref 96–112)
Creatinine, Ser: 1.74 mg/dL — ABNORMAL HIGH (ref 0.50–1.35)
GFR calc Af Amer: 45 mL/min — ABNORMAL LOW (ref >90)
GFR calc non Af Amer: 39 mL/min — ABNORMAL LOW (ref >90)
Glucose, Bld: 103 mg/dL — ABNORMAL HIGH (ref 70–99)
Potassium: 4.3 mEq/L (ref 3.5–5.1)
Sodium: 140 mEq/L (ref 135–145)

## 2011-09-14 LAB — CBC
HCT: 42.8 % (ref 39.0–52.0)
Hemoglobin: 14.5 g/dL (ref 13.0–17.0)
MCH: 29.5 pg (ref 26.0–34.0)
MCHC: 33.9 g/dL (ref 30.0–36.0)
MCV: 87 fL (ref 78.0–100.0)
Platelets: 219 10*3/uL (ref 150–400)
RBC: 4.92 MIL/uL (ref 4.22–5.81)
RDW: 14.6 % (ref 11.5–15.5)
WBC: 6.5 10*3/uL (ref 4.0–10.5)

## 2011-09-14 LAB — SURGICAL PCR SCREEN
MRSA, PCR: NEGATIVE
Staphylococcus aureus: NEGATIVE

## 2011-09-14 NOTE — Pre-Procedure Instructions (Signed)
PST VISIT-   BP elevated x 3 readings- denies headache. States this is normal for him and is that reading at Dr Antony Contras office. States home BP cuff broken.  Instructed him to call PCP for evaluation as this BP is too high and possibly could be cancelled. Verbalizes understanding. States" will call tomorrow- has to go to work today"   Faxed chest x ray and BMET results to Dr [name] with confirmation. Notified Pam at [facility]- states Dr [name] is not there but the faxed results are reviewed by providers in Dr Denton Lank absence

## 2011-09-14 NOTE — Patient Instructions (Addendum)
19 [name]  09/14/2011   Your procedure is scheduled on:  09/26/11  Surgery  [id number]    TUESDAY  Report to [facility] at 0600    AM.  Call this number if you have problems the morning of surgery: [phone]     Or PST   [id number]  [facility]   Remember:                  Do not eat food or drink any fluids :After Midnight.  Monday NIGHT     Take these medicines the morning of surgery with A SIP OF WATER:   CARVEDILOL, CLONIDINE   Do not wear jewelry, make-up or nail polish.  Do not wear lotions, powders, or perfumes. You may wear deodorant.  Do not shave 48 hours prior to surgery.  Do not bring valuables to the hospital.  Contacts, dentures or bridgework may not be worn into surgery.  Leave suitcase in the car. After surgery it may be brought to your room.  For patients admitted to the hospital, checkout time is 11:00 AM the day of discharge.   Patients discharged the day of surgery will not be allowed to drive home.  Name and phone number of your driver:    wife                                                                  Special Instructions: CHG Shower Use Special Wash: 1/2 bottle night before surgery and 1/2 bottle morning of surgery. REGULAR SOAP FACE AND PRIVATES                         MEN-MAY SHAVE FACE MORNING OF SURGERY  Please read over the following fact sheets that you were given: MRSA Information

## 2011-09-19 ENCOUNTER — Other Ambulatory Visit: Payer: Self-pay | Admitting: Urology

## 2011-09-19 DIAGNOSIS — I10 Essential (primary) hypertension: Secondary | ICD-10-CM | POA: Diagnosis not present

## 2011-09-19 DIAGNOSIS — R911 Solitary pulmonary nodule: Secondary | ICD-10-CM

## 2011-09-21 DIAGNOSIS — M79609 Pain in unspecified limb: Secondary | ICD-10-CM | POA: Diagnosis not present

## 2011-09-21 DIAGNOSIS — M109 Gout, unspecified: Secondary | ICD-10-CM | POA: Diagnosis not present

## 2011-09-22 ENCOUNTER — Ambulatory Visit (HOSPITAL_COMMUNITY)
Admission: RE | Admit: 2011-09-22 | Discharge: 2011-09-22 | Disposition: A | Discharge status: Home / Self Care | Payer: Medicare Other | Source: Ambulatory Visit | Attending: Urology | Admitting: Urology

## 2011-09-22 DIAGNOSIS — R911 Solitary pulmonary nodule: Secondary | ICD-10-CM | POA: Insufficient documentation

## 2011-09-22 DIAGNOSIS — R918 Other nonspecific abnormal finding of lung field: Secondary | ICD-10-CM | POA: Diagnosis not present

## 2011-09-22 MED ORDER — IOHEXOL 300 MG/ML  SOLN
80.0000 mL | Freq: Once | INTRAMUSCULAR | Status: AC | PRN
  Administered 2011-09-22: 80 mL via INTRAVENOUS

## 2011-09-25 NOTE — H&P (Signed)
1. Balanitis 607.1 2. 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 Phimosis 605 5. Prostate Hard Area Or Nodule With Postvoiding Residual Urine 600.11 6. PSA,Elevated 790.93  History of Present Illness        [name] is a 66 yo WM sent in consultation by Dr. Dennard Schaumann for a spraying urinary stream if he doesn't reduce his foreskin.  He has some phimosis.   He has had no UTI's.  He did take an antibiotic for a month that he finished about 2 weeks ago.  He has nocturia 3-4x. He wakes with urge and it starts about 5 am and continues until 9 am an he has no problems in the day.  He is currently on tamsulosin that was started about 9 month ago.  He has had no hematuria. He has had kidney stones with the last in around 2004.  He has a lithotripsy and has passed some.   He has a strong family history of renal cancer in his father and 2 brothers.   He had a PSA on 5/21 that was elevated at 11.2.   He reports that his exam was normal.  He reports ED as well which is likely vasculogenic based on his medical history.   Past Medical History 1. History of  A Fall From A Tree E884.9 2. History of  Adult Sleep Apnea 780.57 3. History of  Esophageal Reflux 530.81 4. History of  Gastric Ulcer 531.90 5. History of  Gout 274.9 6. History of  Hypercholesterolemia 272.0 7. History of  Hypertension 401.9 8. History of  Iatrogenic Pneumothorax 512.1 9. History of  Nephrolithiasis V13.01 10. History of  Skin Cancer V10.83  Surgical History 1. History of  Chest Tube Insertion 2. History of  Endovascular Repair Of Aortic Aneurysm 3. History of  Lithotomy  Current Meds 1. Allopurinol 100 MG Oral Tablet; Therapy: (Recorded:31May2013) to 2. AmLODIPine Besylate 10 MG Oral Tablet; Therapy: (Recorded:31May2013) to 3. Carvedilol 25 MG Oral Tablet; Therapy: (Recorded:31May2013) to 4. CloNIDine HCl 0.1 MG Oral Tablet; Therapy: (Recorded:31May2013) to 5. Indomethacin 50 MG Oral Capsule; Therapy:  (Recorded:31May2013) to 6. Klor-Con M20 20 MEQ Oral Tablet Extended Release; Therapy: (Recorded:31May2013) to 7. Pantoprazole Sodium 40 MG Oral Tablet Delayed Release; Therapy: (Recorded:31May2013) to 8. Pravastatin Sodium 40 MG Oral Tablet; Therapy: (Recorded:31May2013) to 9. Spironolactone 25 MG Oral Tablet; Therapy: (Recorded:31May2013) to 10. Tamsulosin HCl 0.4 MG Oral Capsule; Therapy: (Recorded:31May2013) to  Allergies 1. Morphine Derivatives 2. Sulfa Drugs  Family History 1. Family history of  Death In The Family Father 2. Family history of  Death In The Family Mother 3. Family history of  Family Health Status Number Of Children 4. Paternal history of  Kidney Cancer V16.51 5. Fraternal history of  Kidney Cancer V16.51 6. Fraternal history of  Kidney Cancer V16.51  Social History   Caffeine Use 1 a day   Marital History - Currently Married   Occupation: Geophysical data processor   Tobacco Use 305.1 1 ppd x 24 years Denied    History of  Alcohol Use  Review of Systems Genitourinary, constitutional, skin, eye, otolaryngeal, hematologic/lymphatic, cardiovascular, pulmonary, endocrine, musculoskeletal, gastrointestinal, neurological and psychiatric system(s) were reviewed and pertinent findings if present are noted.  Genitourinary: feelings of urinary urgency, dysuria and nocturia.  Hematologic/Lymphatic: a tendency to easily bruise.  Cardiovascular: leg swelling.    Vitals Vital Signs [Data Includes: Last 1 Day]  [id number] 10:52AM  BMI Calculated: 41.53 BSA Calculated: 2.34 Height: 5 ft 8 in  Weight: 274 lb  [id number] 10:47AM  Blood Pressure: 151 / 103 Temperature: 97.6 F Heart Rate: 76  Physical Exam Constitutional: Well nourished and well developed . No acute distress.  ENT:. The ears and nose are normal in appearance.  Neck: The appearance of the neck is normal and no neck mass is present.  Pulmonary: No respiratory distress and normal respiratory rhythm and  effort. (seems somewhat dyspnic).  Cardiovascular: Heart rate and rhythm are normal. Ankle edema  Abdomen: The abdomen is obese. The abdomen is soft and nontender. No masses are palpated. No CVA tenderness. No hernias are palpable. No hepatosplenomegaly noted.  Rectal: Rectal exam demonstrates normal sphincter tone, no tenderness and no masses. Estimated prostate size is 3+. (with assymetry and irregularity on the right). The prostate has a palpable nodule and is not tender. The left seminal vesicle is nonpalpable. The right seminal vesicle is nonpalpable. The perineum is normal on inspection.  Genitourinary: Examination of the penis demonstrates phimosis, balanitis and meatal stenosis, but no discharge, no masses and no lesions. The penis is uncircumcised. The scrotum is without lesions. The right epididymis is palpably normal and non-tender. The left epididymis is palpably normal and non-tender. The right testis is non-tender and without masses. The left testis is non-tender and without masses.  Lymphatics: The supraclavicular, femoral and inguinal nodes are not enlarged or tender.  Skin: Normal skin turgor, no visible rash and no visible skin lesions.  Neuro/Psych:. Mood and affect are appropriate.    Results/Data  Old records or history reviewed: I have reviewed records and labs from Dr. Dennard Schaumann.  The following clinical lab reports were reviewed:  His UA has 300mg /dl protein but is otherwise unremarkable.    Assessment 1. Balanitis 607.1 2. Meatal Stenosis 598.9 3. Phimosis 605 4. PSA,Elevated 790.93 5. 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 He has an elevated PSA with an irregular prostate and needs a biopsy.   Plan Balanitis (607.1)  1. Nystatin-Triamcinolone 100000-0.1 UNIT/GM-% External Cream; APPLY SPARINGLY TO  AFFECTED AREA(S) 3 TIMES A DAY; Therapy: [id number] to  (Evaluate:26Jan2014)  Requested  for: [id number]; Last Rx:31May2013; Edited Health Maintenance (V70.0)  2. UA With REFLEX  Requested for: [id number] PSA,Elevated (790.93)  3. Follow-up Schedule Surgery Office  Follow-up  Requested for: [id number]      I am going to start him on mycolog cream. He needs a circumcision and a prostate [place] and biopsy. I have reviewed the risks of both procedures including bleeding, infection, voiding difficulty and penile scarring along with thrombotic events and anesthetic complications. I will get him set up some time in the next few weeks.

## 2011-09-26 ENCOUNTER — Encounter (HOSPITAL_COMMUNITY)
Admission: RE | Disposition: A | Discharge status: Home / Self Care | Payer: Self-pay | Source: Ambulatory Visit | Attending: Urology

## 2011-09-26 ENCOUNTER — Encounter (HOSPITAL_COMMUNITY): Payer: Self-pay | Admitting: *Deleted

## 2011-09-26 ENCOUNTER — Ambulatory Visit (HOSPITAL_COMMUNITY): Payer: Medicare Other | Admitting: Anesthesiology

## 2011-09-26 ENCOUNTER — Ambulatory Visit (HOSPITAL_COMMUNITY)
Admission: RE | Admit: 2011-09-26 | Discharge: 2011-09-26 | Disposition: A | Discharge status: Home / Self Care | Payer: Medicare Other | Source: Ambulatory Visit | Attending: Urology | Admitting: Urology

## 2011-09-26 ENCOUNTER — Encounter (HOSPITAL_COMMUNITY): Payer: Self-pay | Admitting: Anesthesiology

## 2011-09-26 DIAGNOSIS — N476 Balanoposthitis: Secondary | ICD-10-CM | POA: Diagnosis not present

## 2011-09-26 DIAGNOSIS — N478 Other disorders of prepuce: Secondary | ICD-10-CM | POA: Insufficient documentation

## 2011-09-26 DIAGNOSIS — E7800 Pure hypercholesterolemia, unspecified: Secondary | ICD-10-CM | POA: Diagnosis not present

## 2011-09-26 DIAGNOSIS — Z79899 Other long term (current) drug therapy: Secondary | ICD-10-CM | POA: Insufficient documentation

## 2011-09-26 DIAGNOSIS — K219 Gastro-esophageal reflux disease without esophagitis: Secondary | ICD-10-CM | POA: Diagnosis not present

## 2011-09-26 DIAGNOSIS — R972 Elevated prostate specific antigen [PSA]: Secondary | ICD-10-CM | POA: Diagnosis not present

## 2011-09-26 DIAGNOSIS — N403 Nodular prostate with lower urinary tract symptoms: Secondary | ICD-10-CM | POA: Insufficient documentation

## 2011-09-26 DIAGNOSIS — C61 Malignant neoplasm of prostate: Secondary | ICD-10-CM | POA: Insufficient documentation

## 2011-09-26 DIAGNOSIS — N471 Phimosis: Secondary | ICD-10-CM | POA: Insufficient documentation

## 2011-09-26 DIAGNOSIS — N138 Other obstructive and reflux uropathy: Secondary | ICD-10-CM | POA: Diagnosis not present

## 2011-09-26 DIAGNOSIS — G4730 Sleep apnea, unspecified: Secondary | ICD-10-CM | POA: Insufficient documentation

## 2011-09-26 DIAGNOSIS — N529 Male erectile dysfunction, unspecified: Secondary | ICD-10-CM | POA: Insufficient documentation

## 2011-09-26 DIAGNOSIS — I10 Essential (primary) hypertension: Secondary | ICD-10-CM | POA: Insufficient documentation

## 2011-09-26 DIAGNOSIS — N35919 Unspecified urethral stricture, male, unspecified site: Secondary | ICD-10-CM | POA: Insufficient documentation

## 2011-09-26 HISTORY — PX: PROSTATE BIOPSY: SHX241

## 2011-09-26 HISTORY — PX: CIRCUMCISION: SHX1350

## 2011-09-26 HISTORY — PX: CYSTOSCOPY: SHX5120

## 2011-09-26 SURGERY — BIOPSY, PROSTATE, RECTAL APPROACH, WITH US GUIDANCE
Anesthesia: General | Site: Prostate | Wound class: Clean Contaminated

## 2011-09-26 MED ORDER — LACTATED RINGERS IV SOLN
INTRAVENOUS | Status: DC
  Administered 2011-09-26: 09:00:00 via INTRAVENOUS
  Administered 2011-09-26: 1000 mL via INTRAVENOUS

## 2011-09-26 MED ORDER — LIDOCAINE HCL (PF) 1 % IJ SOLN
INTRAMUSCULAR | Status: DC | PRN
  Administered 2011-09-26: 7 mL

## 2011-09-26 MED ORDER — LIDOCAINE HCL (CARDIAC) 20 MG/ML IV SOLN
INTRAVENOUS | Status: DC | PRN
  Administered 2011-09-26: 50 mg via INTRAVENOUS

## 2011-09-26 MED ORDER — ALBUTEROL SULFATE HFA 108 (90 BASE) MCG/ACT IN AERS
INHALATION_SPRAY | RESPIRATORY_TRACT | Status: DC | PRN
  Administered 2011-09-26: 2 via RESPIRATORY_TRACT

## 2011-09-26 MED ORDER — BACITRACIN-NEOMYCIN-POLYMYXIN 400-5-5000 EX OINT
TOPICAL_OINTMENT | CUTANEOUS | Status: AC
  Filled 2011-09-26: qty 1

## 2011-09-26 MED ORDER — SUCCINYLCHOLINE CHLORIDE 20 MG/ML IJ SOLN
INTRAMUSCULAR | Status: DC | PRN
  Administered 2011-09-26: 100 mg via INTRAVENOUS

## 2011-09-26 MED ORDER — PROMETHAZINE HCL 25 MG/ML IJ SOLN: 6.2500 mg | INTRAMUSCULAR | Status: DC | PRN

## 2011-09-26 MED ORDER — OXYCODONE HCL 5 MG PO TABS
ORAL_TABLET | ORAL | Status: AC
  Filled 2011-09-26: qty 1

## 2011-09-26 MED ORDER — OXYCODONE HCL 5 MG PO TABS
5.0000 mg | ORAL_TABLET | ORAL | Status: DC | PRN
  Administered 2011-09-26: 5 mg via ORAL

## 2011-09-26 MED ORDER — HYDROCODONE-ACETAMINOPHEN 5-325 MG PO TABS: 1.0000 | ORAL_TABLET | Freq: Four times a day (QID) | ORAL | Status: AC | PRN

## 2011-09-26 MED ORDER — CIPROFLOXACIN IN D5W 400 MG/200ML IV SOLN
400.0000 mg | INTRAVENOUS | Status: AC
  Administered 2011-09-26: 400 mg via INTRAVENOUS

## 2011-09-26 MED ORDER — FENTANYL CITRATE 0.05 MG/ML IJ SOLN: 25.0000 ug | INTRAMUSCULAR | Status: DC | PRN

## 2011-09-26 MED ORDER — EPHEDRINE SULFATE 50 MG/ML IJ SOLN
INTRAMUSCULAR | Status: DC | PRN
  Administered 2011-09-26 (×3): 10 mg via INTRAVENOUS

## 2011-09-26 MED ORDER — MIDAZOLAM HCL 5 MG/5ML IJ SOLN
INTRAMUSCULAR | Status: DC | PRN
  Administered 2011-09-26: 2 mg via INTRAVENOUS

## 2011-09-26 MED ORDER — PROPOFOL 10 MG/ML IV BOLUS
INTRAVENOUS | Status: DC | PRN
  Administered 2011-09-26: 200 mg via INTRAVENOUS

## 2011-09-26 MED ORDER — KETOROLAC TROMETHAMINE 30 MG/ML IJ SOLN: 15.0000 mg | Freq: Once | INTRAMUSCULAR | Status: DC | PRN

## 2011-09-26 MED ORDER — STERILE WATER FOR IRRIGATION IR SOLN
Status: DC | PRN
  Administered 2011-09-26: 1000 mL

## 2011-09-26 MED ORDER — CIPROFLOXACIN HCL 500 MG PO TABS: 500.0000 mg | ORAL_TABLET | Freq: Two times a day (BID) | ORAL | Status: AC

## 2011-09-26 MED ORDER — CIPROFLOXACIN IN D5W 400 MG/200ML IV SOLN
INTRAVENOUS | Status: AC
  Filled 2011-09-26: qty 200

## 2011-09-26 MED ORDER — FENTANYL CITRATE 0.05 MG/ML IJ SOLN
INTRAMUSCULAR | Status: DC | PRN
  Administered 2011-09-26 (×2): 50 ug via INTRAVENOUS
  Administered 2011-09-26: 100 ug via INTRAVENOUS

## 2011-09-26 MED ORDER — ONDANSETRON HCL 4 MG/2ML IJ SOLN
INTRAMUSCULAR | Status: DC | PRN
  Administered 2011-09-26: 4 mg via INTRAVENOUS

## 2011-09-26 MED ORDER — DEXTROSE 5 % IV SOLN
1.0000 g | Freq: Once | INTRAVENOUS | Status: AC
  Administered 2011-09-26: 1 g via INTRAVENOUS
  Filled 2011-09-26: qty 10

## 2011-09-26 MED ORDER — BACITRACIN-NEOMYCIN-POLYMYXIN OINTMENT TUBE
TOPICAL_OINTMENT | CUTANEOUS | Status: DC | PRN
  Administered 2011-09-26: 1 via TOPICAL

## 2011-09-26 MED ORDER — LIDOCAINE HCL 1 % IJ SOLN
INTRAMUSCULAR | Status: AC
  Filled 2011-09-26: qty 40

## 2011-09-26 SURGICAL SUPPLY — 41 items
BAG URO CATCHER STRL LF (DRAPE) ×3 IMPLANT
BANDAGE COBAN STERILE 2 (GAUZE/BANDAGES/DRESSINGS) ×4 IMPLANT
BANDAGE CONFORM 2  STR LF (GAUZE/BANDAGES/DRESSINGS) ×4 IMPLANT
BLADE SURG 15 STRL LF DISP TIS (BLADE) ×3 IMPLANT
BLADE SURG 15 STRL SS (BLADE) ×4
CATH FOLEY 2WAY SLVR  5CC 20FR (CATHETERS) ×1
CATH FOLEY 2WAY SLVR 5CC 20FR (CATHETERS) IMPLANT
CATH ROBINSON RED A/P 16FR (CATHETERS) IMPLANT
CLOTH BEACON ORANGE TIMEOUT ST (SAFETY) ×4 IMPLANT
COVER SURGICAL LIGHT HANDLE (MISCELLANEOUS) ×4 IMPLANT
DECANTER SPIKE VIAL GLASS SM (MISCELLANEOUS) ×1 IMPLANT
DRAPE CAMERA CLOSED 9X96 (DRAPES) ×3 IMPLANT
DRAPE LAPAROTOMY T 102X78X121 (DRAPES) ×4 IMPLANT
DRESSING TELFA 8X3 (GAUZE/BANDAGES/DRESSINGS) ×1 IMPLANT
ELECT NDL TIP 2.8 STRL (NEEDLE) ×3 IMPLANT
ELECT NEEDLE TIP 2.8 STRL (NEEDLE) ×4 IMPLANT
ELECT REM PT RETURN 9FT ADLT (ELECTROSURGICAL) ×4
ELECTRODE REM PT RTRN 9FT ADLT (ELECTROSURGICAL) ×3 IMPLANT
GAUZE SPONGE 4X4 16PLY XRAY LF (GAUZE/BANDAGES/DRESSINGS) ×4 IMPLANT
GAUZE XEROFORM 1X8 LF (GAUZE/BANDAGES/DRESSINGS) ×4 IMPLANT
GLOVE SURG SS PI 8.0 STRL IVOR (GLOVE) ×4 IMPLANT
GOWN PREVENTION PLUS XLARGE (GOWN DISPOSABLE) ×4 IMPLANT
GOWN STRL REIN XL XLG (GOWN DISPOSABLE) ×4 IMPLANT
KIT BASIN OR (CUSTOM PROCEDURE TRAY) ×4 IMPLANT
MANIFOLD NEPTUNE II (INSTRUMENTS) ×4 IMPLANT
NDL BIOPSY 18X20 MAGNUM (NEEDLE) IMPLANT
NDL HYPO 25X1 1.5 SAFETY (NEEDLE) IMPLANT
NEEDLE BIOPSY 18X20 MAGNUM (NEEDLE) ×4 IMPLANT
NEEDLE HYPO 25X1 1.5 SAFETY (NEEDLE) ×4 IMPLANT
NS IRRIG 1000ML POUR BTL (IV SOLUTION) IMPLANT
PACK BASIC VI WITH GOWN DISP (CUSTOM PROCEDURE TRAY) ×3 IMPLANT
PACK CYSTO (CUSTOM PROCEDURE TRAY) ×4 IMPLANT
PENCIL BUTTON HOLSTER BLD 10FT (ELECTRODE) ×4 IMPLANT
SPONGE GAUZE 4X4 12PLY (GAUZE/BANDAGES/DRESSINGS) ×3 IMPLANT
SUT CHROMIC 4 0 P 3 18 (SUTURE) ×8 IMPLANT
SUT CHROMIC 4 0 PS 2 18 (SUTURE) ×7 IMPLANT
SYR CONTROL 10ML LL (SYRINGE) IMPLANT
SYRINGE IRR TOOMEY STRL 70CC (SYRINGE) ×1 IMPLANT
TOWEL OR 17X26 10 PK STRL BLUE (TOWEL DISPOSABLE) ×4 IMPLANT
TUBING CONNECTING 10 (TUBING) ×4 IMPLANT
WATER STERILE IRR 1500ML POUR (IV SOLUTION) IMPLANT

## 2011-09-26 NOTE — Transfer of Care (Signed)
Immediate Anesthesia Transfer of Care Note  Patient: [name]  Procedure(s) Performed: Procedure(s) (LRB): BIOPSY TRANSRECTAL ULTRASONIC PROSTATE (TUBP) (N/A) CIRCUMCISION ADULT (N/A) CYSTOSCOPY (N/A)  Patient Location: PACU  Anesthesia Type: General  Level of Consciousness: sedated, patient cooperative and responds to stimulaton  Airway & Oxygen Therapy: Patient Spontanous Breathing and Patient connected to face mask oxgen  Post-op Assessment: Report given to PACU RN and Post -op Vital signs reviewed and stable  Post vital signs: Reviewed and stable  Complications: No apparent anesthesia complications

## 2011-09-26 NOTE — Anesthesia Preprocedure Evaluation (Addendum)
Anesthesia Evaluation  Patient identified by MRN, date of birth, ID band Patient awake    Reviewed: Allergy & Precautions, H&P , NPO status , Patient's Chart, lab work & pertinent test results  Airway Mallampati: II TM Distance: <3 FB Neck ROM: Full    Dental No notable dental hx. (+) Dental Advisory Given and Partial Upper   Pulmonary sleep apnea , Current Smoker,  pulm htn   + decreased breath sounds+ wheezing      Cardiovascular hypertension, Pt. on medications + Peripheral Vascular Disease Rhythm:Regular Rate:Normal     Neuro/Psych negative neurological ROS  negative psych ROS   GI/Hepatic Neg liver ROS, GERD-  ,  Endo/Other  negative endocrine ROSMorbid obesity  Renal/GU negative Renal ROS  negative genitourinary   Musculoskeletal negative musculoskeletal ROS (+)   Abdominal   Peds negative pediatric ROS (+)  Hematology negative hematology ROS (+)   Anesthesia Other Findings   Reproductive/Obstetrics negative OB ROS                         Anesthesia Physical Anesthesia Plan  ASA: III  Anesthesia Plan: General   Post-op Pain Management:    Induction: Intravenous  Airway Management Planned: Oral ETT and LMA  Additional Equipment:   Intra-op Plan:   Post-operative Plan: Extubation in OR  Informed Consent: I have reviewed the patients History and Physical, chart, labs and discussed the procedure including the risks, benefits and alternatives for the proposed anesthesia with the patient or authorized representative who has indicated his/her understanding and acceptance.   Dental advisory given  Plan Discussed with: CRNA  Anesthesia Plan Comments:         Anesthesia Quick Evaluation

## 2011-09-26 NOTE — Brief Op Note (Signed)
09/26/2011  9:38 AM  PATIENT:  [name]  66 y.o. male  PRE-OPERATIVE DIAGNOSIS:  Elevated Prostatic Specific Antigen, Phimosis, Meatal Stenosis, Voiding symptoms  POST-OPERATIVE DIAGNOSIS:  Elevated Prostatic Specific Antigen, Phimosis, Meatal Stenosis, Voiding symptoms  PROCEDURE:  Procedure(s) (LRB): BIOPSY TRANSRECTAL ULTRASONIC PROSTATE (TUBP) (N/A) CIRCUMCISION ADULT (N/A) CYSTOSCOPY (N/A) MEATOTOMY  SURGEON:  Surgeon(s) and Role:    * [name], MD - Primary  PHYSICIAN ASSISTANT:   ASSISTANTS: none   ANESTHESIA:   general  EBL:  Total I/O In: 1000 [I.V.:1000] Out: -   BLOOD ADMINISTERED:none  DRAINS: none   LOCAL MEDICATIONS USED:  LIDOCAINE  and Amount:1% 10 ml  SPECIMEN:  Source of Specimen:  12 core prostate biopsy.  Foreskin discarded.  DISPOSITION OF SPECIMEN:  PATHOLOGY  COUNTS:  YES  TOURNIQUET:  * No tourniquets in log *  DICTATION: .Other Dictation: Dictation Number [id number]  PLAN OF CARE: Discharge to home after PACU  PATIENT DISPOSITION:  PACU - hemodynamically stable.   Delay start of Pharmacological VTE agent (>24hrs) due to surgical blood loss or risk of bleeding: no

## 2011-09-26 NOTE — Discharge Instructions (Addendum)
Prostate Biopsy TRUS Biopsy BEFORE THE TEST   Do not take aspirin. Do not take any medicine that has aspirin in it 7 days before your biopsy.   You may be given a medicine to take on the day of your biopsy.   You may also be given a medicine or treatment to help you go poop (laxative or enema).  AFTER THE TEST  Only take medicine as told by your doctor.   It is normal to have some bleeding from your rectum for the first 5 days.   You may have blood in your pee (urine) or sperm.  Finding out the results of your test Ask when your test results will be ready. Make sure you get your test results. GET HELP RIGHT AWAY IF:  You have a temperature by mouth above 102 F (38.9 C), not controlled by medicine.   You have blood in your pee for more than 5 days.   You have a lot of blood in your pee.   You have bleeding from your rectum for more than 5 days or have a lot of blood in your poop (feces).   You have severe pain.  Document Released: 03/08/2009 Document Revised: 03/09/2011 Document Reviewed: 03/08/2009 [facility] Patient Information 2012 [place].Circumcision, Adult Care After  These instructions give you information on caring for yourself after your procedure. Your doctor may also give you more specific instructions. Call your doctor if you have any problems or questions after your procedure. HOME CARE  Only take medicine as told by your doctor.   Any bandages (dressings) should stay on for at least 2 days. Ask your doctor when to take the bandage off.   Carefully remove the bandage if it gets dirty. Apply medicated cream to the surgical cut (incision). Carefully put a new bandage on.   Do not have sex until your doctor says it is okay.   Do not get your wound wet for 2 days or as told by your doctor.   You may take a sponge bath. Clean around the surgical cut gently with mild soap and water.   You may take a shower after 2 days or as told by your doctor. Do not  take a tub bath. After you shower, gently pat your surgical cut dry. Do not rub it.   Avoid heavy lifting.   Avoid contact sports, biking, or swimming until you have healed.  GET HELP RIGHT AWAY IF:   You develop a fever of 102 F (38.9 C) or higher.   You cannot pee (urinate).   You have pain when you pee.   Your pain is not helped by medicines.   There is more redness or puffiness (swelling) than expected.   There is yellowish white fluid (pus) coming from your wound.   You have bleeding that does not stop when you press on it.   You are not healing as fast as you should be.  MAKE SURE YOU:  Understand these instructions.   Will watch your condition.   Will get help right away if you are not doing well or get worse.  Document Released: 09/06/2007 Document Revised: 03/09/2011 Document Reviewed: 09/06/2007 [facility] Patient Information 2012 [place].

## 2011-09-26 NOTE — Anesthesia Postprocedure Evaluation (Signed)
  Anesthesia Post-op Note  Patient: [name]  Procedure(s) Performed: Procedure(s) (LRB): BIOPSY TRANSRECTAL ULTRASONIC PROSTATE (TUBP) (N/A) CIRCUMCISION ADULT (N/A) CYSTOSCOPY (N/A)  Patient Location: PACU  Anesthesia Type: General  Level of Consciousness: awake and alert   Airway and Oxygen Therapy: Patient Spontanous Breathing  Post-op Pain: mild  Post-op Assessment: Post-op Vital signs reviewed, Patient's Cardiovascular Status Stable, Respiratory Function Stable, Patent Airway and No signs of Nausea or vomiting  Post-op Vital Signs: stable  Complications: No apparent anesthesia complications

## 2011-09-26 NOTE — Interval H&P Note (Signed)
History and Physical Interval Note:  09/26/2011 8:20 AM  [name]  has presented today for surgery, with the diagnosis of Elevated Prostatic Specific Antigen, Phimosis, Meatal Stenosis  The various methods of treatment have been discussed with the patient and family. After consideration of risks, benefits and other options for treatment, the patient has consented to  Procedure(s) (LRB): BIOPSY TRANSRECTAL ULTRASONIC PROSTATE (TUBP) (N/A) CIRCUMCISION ADULT (N/A) CYSTOSCOPY (N/A) as a surgical intervention .  The patient's history has been reviewed, patient examined, no change in status, stable for surgery.  I have reviewed the patients' chart and labs.  Questions were answered to the patient's satisfaction.     Fabianna Keats J

## 2011-09-26 NOTE — Addendum Note (Signed)
Addendum  created 09/26/11 1047 by Anne Fu, CRNA   Modules edited:Anesthesia Responsible Staff

## 2011-09-26 NOTE — Addendum Note (Signed)
Addendum  created 09/26/11 1402 by Anne Fu, CRNA   Modules edited:Anesthesia Responsible Staff

## 2011-09-27 NOTE — Op Note (Signed)
NAMEMarland Kitchen  [name], [name] NO.:  [id number]  MEDICAL RECORD NO.:  [id number]  LOCATION:  [facility]                         FACILITY:  [facility]  PHYSICIAN:  Marshall Cork. [name], M.D.    DATE OF BIRTH:  07/22/1945  DATE OF PROCEDURE:  09/26/2011 DATE OF DISCHARGE:                              OPERATIVE REPORT   PROCEDURE: 1. Transrectal ultrasound with ultrasound-guided prostate biopsy. 2. Meatotomy. 3. Cystoscopy. 4. Circumcision.  PREOPERATIVE DIAGNOSES:  Elevated PSA with nodular prostate and obstruction, phimosis and meatal stenosis.  POSTOPERATIVE DIAGNOSES:  Elevated PSA with nodular prostate and obstruction, phimosis and meatal stenosis.  SURGEON:  Marshall Cork. [name], M.D.  ANESTHESIA:  General.  SPECIMEN:  Prostate biopsies.  BLOOD LOSS:  Minimal.  DRAINS:  None.  COMPLICATIONS:  None.  INDICATIONS:  [name] is a 66 year old white male, who was sent by Dr. Dennard Schaumann for a spraying urinary stream related to redundant foreskin and phimosis.  He also was found to have recent PSA of 11.2 and on exam in the office, he had induration and asymmetry of the right prostate. It was felt that prostate ultrasound and biopsy, circumcision, meatotomy, and cystoscopy were indicated.  FINDINGS OF PROCEDURES:  He was given Cipro and Rocephin, and had taken the routine preop enema for prep.  He was placed on the holding room stretcher and a general anesthetic was induced.  He was then rolled into the left lateral decubitus position for the prostate ultrasound and biopsy.  The transrectal ultrasound probe was assembled and inserted, and the prostate was scanned at 10 megahertz.  The examination revealed a normal seminal vesicles of the prostate, volume was 42 mL with a width of 4.72, a height of 3.65 and a length of 4.65 cm.  The right base and midprostate were hyperechoic with variegated echotexture with high suspicion for cancer.  There was also some hypoechogenicity,  although less apparent at the left base and midprostate.  There was a large calcification in the left transitional zone in the midprostate and there was some suggestion of possible cancer infiltration at the apex along the urethra.  After completion of the diagnostic scan, a 12 core biopsy was obtained in usual configuration.  There was some induration apparent on the biopsy raising the concern further for the presence of a cancer in the right prostate.  Once the biopsies were completed, the patient was rolled back supine. He was placed in the lithotomy position.  He had had PAS hose placed. He was rolled back into the supine position.  His genitalia was prepped with Betadine solution.  He was draped in usual sterile fashion.  An initial attempt at cystoscopy was unsuccessful due to the meatal stenosis, so a ventral slit was performed on the meatus after crushing the tissue with a straight hemostat.  Once the meatotomy was completed, cystoscopy was then performed.  A 16-French flexible cystoscopy demonstrated a normal-appearing urethra. The external sphincter was intact.  The prostatic urethra was approximately 3 cm in length with some bilobar hyperplasia with minimal- to-mild obstruction.  Inspection of the bladder initially was difficult due to some bloody urine from the biopsy.  The bladder was aspirated free of bloody urine  and cystoscopy was then performed, this revealed a normal bladder wall with minimal tuberculation.  No tumors or stones were noted, and the ureteral orifices were unremarkable.  After completion of cystoscopy, a circumcision was performed.  The dorsal and ventral slit were performed and the redundant wings of foreskin were excised.  Hemostasis was achieved with the Bovie.  Care was taken not to excise excessive skin due to the patient's relatively short penile length and large pubic fat pad.  The skin edges were then reapproximated using a running 4-0  chromic interrupted in the quadrants.  Once the circumcision had been completed, a 20-French Foley catheter was placed and the bladder was irrigated with return of a few small clots that had been noted during the cystoscopy.  The catheter was removed.  A penile block was performed with 1% lidocaine without epinephrine, approximately 10 mL was used at the base of the penis.  A dressing of Xeroform, 2-inch Kling, and Coban was then applied, and Neosporin was placed on the urethral meatus.  The patient's anesthetic was reversed.  He was then moved to the recovery room in stable condition.  The prostate cores were sent for pathology.  The foreskin was discarded.     Marshall Cork. [name], M.D.     JJW/MEDQ  D:  09/26/2011  T:  09/26/2011  Job:  [id number]  cc:   Marrianne Mood, MD Fax: [phone]

## 2011-09-28 ENCOUNTER — Encounter (HOSPITAL_COMMUNITY): Payer: Self-pay | Admitting: Urology

## 2011-10-02 ENCOUNTER — Other Ambulatory Visit: Payer: Self-pay | Admitting: Urology

## 2011-10-02 DIAGNOSIS — C61 Malignant neoplasm of prostate: Secondary | ICD-10-CM

## 2011-10-03 ENCOUNTER — Other Ambulatory Visit: Payer: Self-pay | Admitting: Urology

## 2011-10-03 DIAGNOSIS — C61 Malignant neoplasm of prostate: Secondary | ICD-10-CM

## 2011-10-06 ENCOUNTER — Encounter (HOSPITAL_COMMUNITY): Payer: Self-pay

## 2011-10-06 ENCOUNTER — Encounter (HOSPITAL_COMMUNITY)
Admission: RE | Admit: 2011-10-06 | Discharge: 2011-10-06 | Disposition: A | Discharge status: Home / Self Care | Payer: Medicare Other | Source: Ambulatory Visit | Attending: Urology | Admitting: Urology

## 2011-10-06 ENCOUNTER — Ambulatory Visit (HOSPITAL_COMMUNITY)
Admission: RE | Admit: 2011-10-06 | Discharge: 2011-10-06 | Disposition: A | Discharge status: Home / Self Care | Payer: Medicare Other | Source: Ambulatory Visit | Attending: Urology | Admitting: Urology

## 2011-10-06 DIAGNOSIS — R599 Enlarged lymph nodes, unspecified: Secondary | ICD-10-CM | POA: Insufficient documentation

## 2011-10-06 DIAGNOSIS — N200 Calculus of kidney: Secondary | ICD-10-CM | POA: Insufficient documentation

## 2011-10-06 DIAGNOSIS — Z8546 Personal history of malignant neoplasm of prostate: Secondary | ICD-10-CM | POA: Diagnosis not present

## 2011-10-06 DIAGNOSIS — C61 Malignant neoplasm of prostate: Secondary | ICD-10-CM

## 2011-10-06 MED ORDER — IOHEXOL 300 MG/ML  SOLN
100.0000 mL | Freq: Once | INTRAMUSCULAR | Status: AC | PRN
  Administered 2011-10-06: 100 mL via INTRAVENOUS

## 2011-10-06 MED ORDER — TECHNETIUM TC 99M MEDRONATE IV KIT
25.0000 | PACK | Freq: Once | INTRAVENOUS | Status: AC | PRN
  Administered 2011-10-06: 26 via INTRAVENOUS

## 2011-10-13 ENCOUNTER — Ambulatory Visit (INDEPENDENT_AMBULATORY_CARE_PROVIDER_SITE_OTHER): Payer: Medicare Other | Admitting: Urology

## 2011-10-13 ENCOUNTER — Other Ambulatory Visit: Payer: Self-pay | Admitting: Urology

## 2011-10-13 DIAGNOSIS — C61 Malignant neoplasm of prostate: Secondary | ICD-10-CM | POA: Diagnosis not present

## 2011-10-13 DIAGNOSIS — Z79899 Other long term (current) drug therapy: Secondary | ICD-10-CM

## 2011-10-13 DIAGNOSIS — C775 Secondary and unspecified malignant neoplasm of intrapelvic lymph nodes: Secondary | ICD-10-CM

## 2011-10-13 DIAGNOSIS — Z09 Encounter for follow-up examination after completed treatment for conditions other than malignant neoplasm: Secondary | ICD-10-CM

## 2011-10-21 ENCOUNTER — Encounter (HOSPITAL_COMMUNITY): Payer: Self-pay | Admitting: *Deleted

## 2011-10-21 ENCOUNTER — Emergency Department (HOSPITAL_COMMUNITY)
Admission: EM | Admit: 2011-10-21 | Discharge: 2011-10-21 | Disposition: A | Discharge status: Home / Self Care | Payer: Medicare Other | Attending: Emergency Medicine | Admitting: Emergency Medicine

## 2011-10-21 ENCOUNTER — Emergency Department (HOSPITAL_COMMUNITY): Payer: Medicare Other

## 2011-10-21 DIAGNOSIS — J449 Chronic obstructive pulmonary disease, unspecified: Secondary | ICD-10-CM | POA: Insufficient documentation

## 2011-10-21 DIAGNOSIS — I10 Essential (primary) hypertension: Secondary | ICD-10-CM | POA: Diagnosis not present

## 2011-10-21 DIAGNOSIS — M109 Gout, unspecified: Secondary | ICD-10-CM | POA: Diagnosis not present

## 2011-10-21 DIAGNOSIS — R079 Chest pain, unspecified: Secondary | ICD-10-CM

## 2011-10-21 DIAGNOSIS — I739 Peripheral vascular disease, unspecified: Secondary | ICD-10-CM | POA: Insufficient documentation

## 2011-10-21 DIAGNOSIS — R1011 Right upper quadrant pain: Secondary | ICD-10-CM | POA: Diagnosis not present

## 2011-10-21 DIAGNOSIS — I2510 Atherosclerotic heart disease of native coronary artery without angina pectoris: Secondary | ICD-10-CM | POA: Diagnosis not present

## 2011-10-21 DIAGNOSIS — Z87442 Personal history of urinary calculi: Secondary | ICD-10-CM | POA: Insufficient documentation

## 2011-10-21 DIAGNOSIS — Z79899 Other long term (current) drug therapy: Secondary | ICD-10-CM | POA: Diagnosis not present

## 2011-10-21 DIAGNOSIS — R110 Nausea: Secondary | ICD-10-CM | POA: Diagnosis not present

## 2011-10-21 DIAGNOSIS — J4489 Other specified chronic obstructive pulmonary disease: Secondary | ICD-10-CM | POA: Insufficient documentation

## 2011-10-21 DIAGNOSIS — F17200 Nicotine dependence, unspecified, uncomplicated: Secondary | ICD-10-CM | POA: Insufficient documentation

## 2011-10-21 LAB — COMPREHENSIVE METABOLIC PANEL
ALT: 20 U/L (ref 0–53)
AST: 16 U/L (ref 0–37)
Albumin: 4 g/dL (ref 3.5–5.2)
Alkaline Phosphatase: 83 U/L (ref 39–117)
BUN: 30 mg/dL — ABNORMAL HIGH (ref 6–23)
CO2: 22 mEq/L (ref 19–32)
Calcium: 9.4 mg/dL (ref 8.4–10.5)
Chloride: 102 mEq/L (ref 96–112)
Creatinine, Ser: 2.17 mg/dL — ABNORMAL HIGH (ref 0.50–1.35)
GFR calc Af Amer: 35 mL/min — ABNORMAL LOW (ref >90)
GFR calc non Af Amer: 30 mL/min — ABNORMAL LOW (ref >90)
Glucose, Bld: 98 mg/dL (ref 70–99)
Potassium: 4.1 mEq/L (ref 3.5–5.1)
Sodium: 135 mEq/L (ref 135–145)
Total Bilirubin: 0.3 mg/dL (ref 0.3–1.2)
Total Protein: 7.2 g/dL (ref 6.0–8.3)

## 2011-10-21 LAB — CBC
HCT: 42.8 % (ref 39.0–52.0)
Hemoglobin: 14.5 g/dL (ref 13.0–17.0)
MCH: 29.5 pg (ref 26.0–34.0)
MCHC: 33.9 g/dL (ref 30.0–36.0)
MCV: 87 fL (ref 78.0–100.0)
Platelets: 230 10*3/uL (ref 150–400)
RBC: 4.92 MIL/uL (ref 4.22–5.81)
RDW: 14.3 % (ref 11.5–15.5)
WBC: 7.6 10*3/uL (ref 4.0–10.5)

## 2011-10-21 LAB — URINALYSIS, ROUTINE W REFLEX MICROSCOPIC
Bilirubin Urine: NEGATIVE
Glucose, UA: NEGATIVE mg/dL
Ketones, ur: NEGATIVE mg/dL
Leukocytes, UA: NEGATIVE
Nitrite: NEGATIVE
Protein, ur: 30 mg/dL — AB
Specific Gravity, Urine: 1.02 (ref 1.005–1.030)
Urobilinogen, UA: 0.2 mg/dL (ref 0.0–1.0)
pH: 6 (ref 5.0–8.0)

## 2011-10-21 LAB — TROPONIN I: Troponin I: <0.3 ng/mL (ref <0.30)

## 2011-10-21 LAB — URINE MICROSCOPIC-ADD ON

## 2011-10-21 MED ORDER — SODIUM CHLORIDE 0.9 % IV SOLN
20.0000 mL | INTRAVENOUS | Status: DC
  Administered 2011-10-21: 20 mL via INTRAVENOUS

## 2011-10-21 MED ORDER — ASPIRIN 81 MG PO CHEW
324.0000 mg | CHEWABLE_TABLET | Freq: Once | ORAL | Status: AC
  Administered 2011-10-21: 324 mg via ORAL
  Filled 2011-10-21: qty 3

## 2011-10-21 MED ORDER — ALUM & MAG HYDROXIDE-SIMETH 200-200-20 MG/5ML PO SUSP
30.0000 mL | Freq: Once | ORAL | Status: AC
  Administered 2011-10-21: 30 mL via ORAL
  Filled 2011-10-21: qty 30

## 2011-10-21 NOTE — ED Provider Notes (Signed)
History  This chart was scribed for Richarda Blade, MD by Kevan Rosebush. This patient was seen in room APA14/APA14 and the patient's care was started at 19:29.   CSN: [id number]  Arrival date & time 10/21/11  [id number]   First MD Initiated Contact with Patient 10/21/11 1929      Chief Complaint  Patient presents with  . Chest Pain    (Consider location/radiation/quality/duration/timing/severity/associated sxs/prior treatment) The history is provided by the patient.    [name] is a 66 y.o. male who presents to the Emergency Department complaining of intermittent mild chest pain for the last 4 days, in episodes of about 20 minutes. Pt denies any associated dizziness, weakness, cough, SOB, or fever. Pt admits at the worst the pain is a 2-3/10 but currently is a 1/10 and is sometimes aggravated by eating. Pt reports having a roast beef sandwich, a coke, and some magnesium supplements this afternoon, after which he had a bowel movement for the first time in 4 days.  Pt reports getting diagnosed 2 weeks ago with cancer and has a h/o cardiac catheterization, HTN and two collapsed lungs.    Past Medical History  Diagnosis Date  . Coronary artery disease   . Gout   . Shortness of breath     with exertion  . Chronic kidney disease     stone,   elevated PSA  . GERD (gastroesophageal reflux disease)   . Hypertension     medical clearance  Dr Dennard Schaumann on chart  . COPD (chronic obstructive pulmonary disease)     "collapsed left lung" years ago also  . Sleep apnea     no CPAP- "cant tolerate"  . Anginal pain 6/11    cath and stress test  6/11 on chart- denies any pain since  . Peripheral vascular disease     artherosclerotic ulcer infrarenal aorta--s/p endograft 2004-  followed by yearly scan- last 10/12 EPIC  . Cancer     Past Surgical History  Procedure Date  . Vascular surgery 2004    endograft - states METAL STENT  . Cardiac catheterization 2011  . Colonoscopy w/ polypectomy    . Hernia repair     umbilical  . Fracture surgery     bilateral lower arms as a child  . Knee arthroscopy     left  . Prostate biopsy 09/26/2011    Procedure: BIOPSY TRANSRECTAL ULTRASONIC PROSTATE (TUBP);  Surgeon: [name], MD;  Location: [facility];  Service: Urology;  Laterality: N/A;      . Circumcision 09/26/2011    Procedure: CIRCUMCISION ADULT;  Surgeon: [name], MD;  Location: [facility];  Service: Urology;  Laterality: N/A;  . Cystoscopy 09/26/2011    Procedure: CYSTOSCOPY;  Surgeon: [name], MD;  Location: [facility];  Service: Urology;  Laterality: N/A;    History reviewed. No pertinent family history.  History  Substance Use Topics  . Smoking status: Current Everyday Smoker -- 1.0 packs/day for 24 years  . Smokeless tobacco: Never Used  . Alcohol Use: No      Review of Systems  Constitutional: Negative for fever, chills and fatigue.  Respiratory: Negative for cough and shortness of breath.   Gastrointestinal: Positive for nausea. Negative for vomiting.  Neurological: Negative for dizziness and weakness.    Allergies  Morphine sulfate; Sulfacetamide sodium; and Sulfamethoxazole w-trimethoprim  Home Medications   Current Outpatient Rx  Name Route Sig Dispense Refill  . ALLOPURINOL 100 MG PO TABS  Oral Take 100 mg by mouth 3 (three) times daily.     [name] ALPRAZOLAM 0.5 MG PO TABS Oral Take 0.5 mg by mouth at bedtime.    [name] AMLODIPINE BESYLATE 10 MG PO TABS Oral Take 10 mg by mouth at bedtime.    [name] CARVEDILOL 25 MG PO TABS  TAKE ONE TABLET BY MOUTH TWICE DAILY 60 tablet 5  . CLONIDINE HCL 0.1 MG PO TABS Oral Take 0.1 mg by mouth 2 (two) times daily.    . INDOMETHACIN 50 MG PO CAPS Oral Take 50 mg by mouth at bedtime.     [name] LOSARTAN POTASSIUM 50 MG PO TABS Oral Take 50 mg by mouth daily.    [name] PANTOPRAZOLE SODIUM 40 MG PO TBEC Oral Take 40 mg by mouth at bedtime.     [name] POTASSIUM CHLORIDE CRYS ER 20 MEQ PO TBCR Oral Take 40 mEq by mouth 2 (two) times daily.    [name]  PRAVASTATIN SODIUM 40 MG PO TABS Oral Take 40 mg by mouth at bedtime.    . SPIRONOLACTONE 25 MG PO TABS Oral Take 12.5 mg by mouth at bedtime. Takes 1/2 tablet      Triage Vitals: BP 151/102  Pulse 72  Temp 97.8 F (36.6 C) (Oral)  Resp 20  Ht 5\' 8"  (1.727 m)  Wt 263 lb (119.296 kg)  BMI 39.99 kg/m2  SpO2 97%  Physical Exam  Nursing note and vitals reviewed. Constitutional: He is oriented to person, place, and time. He appears well-developed and well-nourished.  HENT:  Head: Normocephalic and atraumatic.  Mouth/Throat: Mucous membranes are dry.  Eyes: Conjunctivae and EOM are normal. Pupils are equal, round, and reactive to light.  Neck: Normal range of motion and phonation normal. Neck supple.  Cardiovascular: Normal rate, regular rhythm, normal heart sounds and intact distal pulses.   No murmur heard. Pulmonary/Chest: Effort normal and breath sounds normal. He exhibits no tenderness and no bony tenderness.  Abdominal: Soft. Normal appearance.       Mild RUQ tenderness at the site of a hormone injection 2 weeks ago.  Musculoskeletal: Normal range of motion.  Neurological: He is alert and oriented to person, place, and time. He has normal strength. No cranial nerve deficit or sensory deficit. He exhibits normal muscle tone. Coordination normal.  Skin: Skin is warm, dry and intact.  Psychiatric: He has a normal mood and affect. His behavior is normal. Judgment and thought content normal.    ED Course  Procedures (including critical care time)  Chart review: Left heart catheterization, July 2011: Normal coronaries except 30% stenosis in the LAD. Elevated end-diastolic LV pressure at [id number]. Catheterization was done to followup on an abnormal stress Myoview.   Date: 10/21/2011  Rate: 72  Rhythm: normal sinus rhythm  QRS Axis: left  Intervals: 1st degree AV block  ST/T Wave abnormalities: normal  Conduction Disutrbances:left anterior fascicular block  Narrative  Interpretation:   Old EKG Reviewed: unchanged  ASA given after initial evaluation.      DIAGNOSTIC STUDIES: Oxygen Saturation is 97% on room air, normal by my interpretation.    COORDINATION OF CARE: 19:35--I evaluated the patient and we discussed a treatment plan including Asprin, blood work, and a chest x-ray to which the pt agreed.   19:45--Medication orders: 0.9% sodium chloride infusion--Continuous   Aspirin chewable tablet 324 mg--once  21:47--I rechecked the pt, informed him of his lab results, and he notified me that he had a circumcision two weeks ago.  22:00--Medication order: alum & mag hydroxidesmeth (Maalox/Mylanta) 200-200-20 mg/5 mL suspension 30 mL--once   Labs Reviewed  COMPREHENSIVE METABOLIC PANEL - Abnormal; Notable for the following:    BUN 30 (*)     Creatinine, Ser 2.17 (*)     GFR calc non Af Amer 30 (*)     GFR calc Af Amer 35 (*)     All other components within normal limits  URINALYSIS, ROUTINE W REFLEX MICROSCOPIC - Abnormal; Notable for the following:    Hgb urine dipstick SMALL (*)     Protein, ur 30 (*)     All other components within normal limits  CBC  TROPONIN I  URINE MICROSCOPIC-ADD ON  URINE CULTURE   Dg Chest Portable 1 View  10/21/2011  *RADIOLOGY REPORT*  Clinical Data: 66 year old male with chest pain.  PORTABLE CHEST - 1 VIEW  Comparison: 09/14/2011 and earlier.  Findings: Portable upright AP view at 1950 hours.  Lordotic view. Stable cardiac size and mediastinal contours.  No pneumothorax, pulmonary edema, pleural effusion or confluent pulmonary opacity. Small calcified right hilar lymph node again noted.  IMPRESSION: No acute cardiopulmonary abnormality.  Original Report Authenticated By: Joni Fears III, M.D.     1. Chest pain       MDM   Nonspecific chest pain, unlikely to represent cardiac disease. He is a low risk patient with recent cardiac catheterization. That did not show significant coronary artery disease. Chest  pain is atypical for cardiac illness. GERD is suspected. He has an incidental abnormal urinary analysis. Had recent instrumentation with circumcision. 2 weeks ago, and preceding prostate biopsy. Urine culture is sent and pending. No antibiotics given. At discharge pending the culture results. He is encouraged to followup with his primary care Dr. if not better in 3 days.       Plan: Home Medications- usual plus Maalox; Home Treatments- rest ; Recommended follow up- Dr. Dennard Schaumann or cardiologist if symptoms do not improve in a couple days.  I personally performed the services described in this documentation, which was scribed in my presence. The recorded information has been reviewed and considered.           Richarda Blade, MD 10/22/11 [phone]

## 2011-10-21 NOTE — ED Notes (Signed)
Pt reporting pain in chest x4 days.  Reports pain started in middle of chest, but now is more on left side.  States "I don't know if it's anxiety or not, I was just told I have cancer." Reporting some nausea.  States "If I could just burp or vomit I think I'd feel better."  Denies dizziness or SOB.

## 2011-10-24 LAB — URINE CULTURE
Colony Count: NO GROWTH
Culture: NO GROWTH

## 2011-10-25 ENCOUNTER — Ambulatory Visit (HOSPITAL_COMMUNITY)
Admission: RE | Admit: 2011-10-25 | Discharge: 2011-10-25 | Disposition: A | Discharge status: Home / Self Care | Payer: Medicare Other | Source: Ambulatory Visit | Attending: Urology | Admitting: Urology

## 2011-10-25 DIAGNOSIS — Z13820 Encounter for screening for osteoporosis: Secondary | ICD-10-CM | POA: Diagnosis not present

## 2011-10-25 DIAGNOSIS — F17200 Nicotine dependence, unspecified, uncomplicated: Secondary | ICD-10-CM | POA: Insufficient documentation

## 2011-10-25 DIAGNOSIS — Z79899 Other long term (current) drug therapy: Secondary | ICD-10-CM

## 2011-10-25 DIAGNOSIS — C61 Malignant neoplasm of prostate: Secondary | ICD-10-CM

## 2011-10-31 DIAGNOSIS — D045 Carcinoma in situ of skin of trunk: Secondary | ICD-10-CM | POA: Diagnosis not present

## 2011-11-20 DIAGNOSIS — C61 Malignant neoplasm of prostate: Secondary | ICD-10-CM | POA: Diagnosis not present

## 2011-11-30 DIAGNOSIS — D485 Neoplasm of uncertain behavior of skin: Secondary | ICD-10-CM | POA: Diagnosis not present

## 2011-11-30 DIAGNOSIS — C4491 Basal cell carcinoma of skin, unspecified: Secondary | ICD-10-CM | POA: Diagnosis not present

## 2011-12-18 DIAGNOSIS — M109 Gout, unspecified: Secondary | ICD-10-CM | POA: Diagnosis not present

## 2011-12-18 DIAGNOSIS — Z23 Encounter for immunization: Secondary | ICD-10-CM | POA: Diagnosis not present

## 2011-12-27 DIAGNOSIS — IMO0002 Reserved for concepts with insufficient information to code with codable children: Secondary | ICD-10-CM | POA: Diagnosis not present

## 2011-12-27 DIAGNOSIS — S8990XA Unspecified injury of unspecified lower leg, initial encounter: Secondary | ICD-10-CM | POA: Diagnosis not present

## 2011-12-27 DIAGNOSIS — S99919A Unspecified injury of unspecified ankle, initial encounter: Secondary | ICD-10-CM | POA: Diagnosis not present

## 2012-01-02 DIAGNOSIS — M25569 Pain in unspecified knee: Secondary | ICD-10-CM | POA: Diagnosis not present

## 2012-01-10 DIAGNOSIS — IMO0002 Reserved for concepts with insufficient information to code with codable children: Secondary | ICD-10-CM | POA: Diagnosis not present

## 2012-01-10 DIAGNOSIS — Y9389 Activity, other specified: Secondary | ICD-10-CM | POA: Diagnosis not present

## 2012-01-10 DIAGNOSIS — Y929 Unspecified place or not applicable: Secondary | ICD-10-CM | POA: Diagnosis not present

## 2012-01-10 DIAGNOSIS — X58XXXA Exposure to other specified factors, initial encounter: Secondary | ICD-10-CM | POA: Diagnosis not present

## 2012-01-10 DIAGNOSIS — Y998 Other external cause status: Secondary | ICD-10-CM | POA: Diagnosis not present

## 2012-01-10 DIAGNOSIS — M2240 Chondromalacia patellae, unspecified knee: Secondary | ICD-10-CM | POA: Diagnosis not present

## 2012-01-16 DIAGNOSIS — M25569 Pain in unspecified knee: Secondary | ICD-10-CM | POA: Diagnosis not present

## 2012-01-16 DIAGNOSIS — IMO0002 Reserved for concepts with insufficient information to code with codable children: Secondary | ICD-10-CM | POA: Diagnosis not present

## 2012-01-16 DIAGNOSIS — M2240 Chondromalacia patellae, unspecified knee: Secondary | ICD-10-CM | POA: Diagnosis not present

## 2012-01-23 DIAGNOSIS — M2240 Chondromalacia patellae, unspecified knee: Secondary | ICD-10-CM | POA: Diagnosis not present

## 2012-01-23 DIAGNOSIS — M25569 Pain in unspecified knee: Secondary | ICD-10-CM | POA: Diagnosis not present

## 2012-01-23 DIAGNOSIS — IMO0002 Reserved for concepts with insufficient information to code with codable children: Secondary | ICD-10-CM | POA: Diagnosis not present

## 2012-02-02 ENCOUNTER — Encounter: Payer: Self-pay | Admitting: Surgery

## 2012-02-05 ENCOUNTER — Other Ambulatory Visit: Payer: Self-pay | Admitting: *Deleted

## 2012-02-05 ENCOUNTER — Ambulatory Visit: Payer: Medicare Other | Admitting: Surgery

## 2012-02-05 DIAGNOSIS — Z48812 Encounter for surgical aftercare following surgery on the circulatory system: Secondary | ICD-10-CM

## 2012-02-05 DIAGNOSIS — E782 Mixed hyperlipidemia: Secondary | ICD-10-CM | POA: Diagnosis not present

## 2012-02-05 DIAGNOSIS — I714 Abdominal aortic aneurysm, without rupture, unspecified: Secondary | ICD-10-CM

## 2012-02-05 DIAGNOSIS — K2100 Gastro-esophageal reflux disease with esophagitis, without bleeding: Secondary | ICD-10-CM | POA: Diagnosis not present

## 2012-02-05 DIAGNOSIS — I10 Essential (primary) hypertension: Secondary | ICD-10-CM | POA: Diagnosis not present

## 2012-02-07 DIAGNOSIS — K219 Gastro-esophageal reflux disease without esophagitis: Secondary | ICD-10-CM | POA: Diagnosis not present

## 2012-02-07 DIAGNOSIS — I10 Essential (primary) hypertension: Secondary | ICD-10-CM | POA: Diagnosis not present

## 2012-02-16 ENCOUNTER — Encounter: Payer: Self-pay | Admitting: Surgery

## 2012-02-19 ENCOUNTER — Ambulatory Visit (INDEPENDENT_AMBULATORY_CARE_PROVIDER_SITE_OTHER): Payer: Medicare Other | Admitting: Surgery

## 2012-02-19 ENCOUNTER — Encounter: Payer: Self-pay | Admitting: Surgery

## 2012-02-19 ENCOUNTER — Ambulatory Visit
Admission: RE | Admit: 2012-02-19 | Discharge: 2012-02-19 | Disposition: A | Discharge status: Home / Self Care | Payer: Medicare Other | Source: Ambulatory Visit | Attending: Surgery | Admitting: Surgery

## 2012-02-19 VITALS — BP 135/88 | HR 77 | Resp 18 | Ht 68.0 in | Wt 284.4 lb

## 2012-02-19 DIAGNOSIS — I714 Abdominal aortic aneurysm, without rupture, unspecified: Secondary | ICD-10-CM

## 2012-02-19 DIAGNOSIS — Z48812 Encounter for surgical aftercare following surgery on the circulatory system: Secondary | ICD-10-CM

## 2012-02-19 DIAGNOSIS — I700 Atherosclerosis of aorta: Secondary | ICD-10-CM | POA: Insufficient documentation

## 2012-02-19 MED ORDER — IOHEXOL 350 MG/ML SOLN
100.0000 mL | Freq: Once | INTRAVENOUS | Status: AC | PRN
  Administered 2012-02-19: 100 mL via INTRAVENOUS

## 2012-02-19 NOTE — Addendum Note (Signed)
Addended by: Mena Goes on: 02/19/2012 10:04 AM   Modules accepted: Orders

## 2012-02-19 NOTE — Progress Notes (Signed)
[facility] [place]   Patient name: [name] MRN: [id number] DOB: Nov 08, 1945 Sex: male     Chief Complaint  Patient presents with  . AAA    Follow up today with CTA scan from [facility].    HISTORY OF PRESENT ILLNESS: The patient is back today for followup. He is status post endovascular aortic intervention on 09/13/2007 by Dr. [name]. This was done for a penetrating ulcer. A abdominal ultrasound was attempted last week but due to bowel gas, the aorta could not be visualized. Therefore, he was sent for a CT and Gram. The patient denies abdominal or back pain. He is currently being treated for prostate cancer.  Past Medical History  Diagnosis Date  . Coronary artery disease   . Gout   . Shortness of breath     with exertion  . Chronic kidney disease     stone,   elevated PSA  . GERD (gastroesophageal reflux disease)   . Hypertension     medical clearance  Dr Dennard Schaumann on chart  . COPD (chronic obstructive pulmonary disease)     "collapsed left lung" years ago also  . Sleep apnea     no CPAP- "cant tolerate"  . Anginal pain 6/11    cath and stress test  6/11 on chart- denies any pain since  . Peripheral vascular disease     artherosclerotic ulcer infrarenal aorta--s/p endograft 2004-  followed by yearly scan- last 10/12 EPIC  . Cancer   . AAA (abdominal aortic aneurysm)     Past Surgical History  Procedure Date  . Vascular surgery 2004    endograft - states METAL STENT  . Cardiac catheterization 2011  . Colonoscopy w/ polypectomy   . Hernia repair     umbilical  . Fracture surgery     bilateral lower arms as a child  . Knee arthroscopy     left  . Prostate biopsy 09/26/2011    Procedure: BIOPSY TRANSRECTAL ULTRASONIC PROSTATE (TUBP);  Surgeon: [name], MD;  Location: [facility];  Service: Urology;  Laterality: N/A;      . Circumcision 09/26/2011    Procedure: CIRCUMCISION ADULT;  Surgeon: [name], MD;  Location: [facility];  Service:  Urology;  Laterality: N/A;  . Cystoscopy 09/26/2011    Procedure: CYSTOSCOPY;  Surgeon: [name], MD;  Location: [facility];  Service: Urology;  Laterality: N/A;  . Knee surgery Sept. 26, 2013    Torn minisc.- Right  knee    History   Social History  . Marital Status: Married    Spouse Name: N/A    Number of Children: N/A  . Years of Education: N/A   Occupational History  . Not on file.   Social History Main Topics  . Smoking status: Current Every Day Smoker -- 1.0 packs/day for 24 years  . Smokeless tobacco: Never Used  . Alcohol Use: No  . Drug Use: No  . Sexually Active:    Other Topics Concern  . Not on file   Social History Narrative  . No narrative on file    Family History  Problem Relation Age of Onset  . Stroke Mother   . Mesothelioma Father     Allergies as of 02/19/2012 - Review Complete 02/19/2012  Allergen Reaction Noted  . Morphine sulfate  09/28/2008  . Sulfacetamide sodium  01/31/2007  . Sulfamethoxazole w-trimethoprim  07/01/2008    Current Outpatient Prescriptions on File Prior to Visit  Medication Sig Dispense Refill  . allopurinol (ZYLOPRIM) 100 MG tablet Take 100 mg by mouth 3 (three) times daily.       [name] ALPRAZolam (XANAX) 0.5 MG tablet Take 0.5 mg by mouth at bedtime.      [name] amLODipine (NORVASC) 10 MG tablet Take 10 mg by mouth at bedtime.      . carvedilol (COREG) 25 MG tablet TAKE ONE TABLET BY MOUTH TWICE DAILY  60 tablet  5  . cloNIDine (CATAPRES) 0.1 MG tablet Take 0.1 mg by mouth 2 (two) times daily.      . indomethacin (INDOCIN) 50 MG capsule Take 50 mg by mouth at bedtime.       [name] losartan (COZAAR) 50 MG tablet Take 50 mg by mouth daily.      . pantoprazole (PROTONIX) 40 MG tablet Take 40 mg by mouth at bedtime.       . potassium chloride SA (K-DUR,KLOR-CON) 20 MEQ tablet Take 40 mEq by mouth 2 (two) times daily.      . pravastatin (PRAVACHOL) 40 MG tablet Take 40 mg by mouth at bedtime.      [name] spironolactone (ALDACTONE) 25 MG  tablet Take 12.5 mg by mouth at bedtime. Takes 1/2 tablet       No current facility-administered medications on file prior to visit.     REVIEW OF SYSTEMS: Positive for torn right knee meniscus and prostate cancer. All other systems are negative as documented by the patient and the encounter form.  PHYSICAL EXAMINATION:   Vital signs are BP 135/88  Pulse 77  Resp 18  Ht 5\' 8"  (1.727 m)  Wt 284 lb 6.4 oz (129.003 kg)  BMI 43.24 kg/m2  SpO2 95% General: The patient appears their stated age. HEENT:  No gross abnormalities Pulmonary:  Non labored breathing Abdomen: Soft and non-tender, obese Musculoskeletal: There are no major deformities. Neurologic: No focal weakness or paresthesias are detected, Skin: There are no ulcer or rashes noted. Psychiatric: The patient has normal affect. Cardiovascular: Palpable pedal pulses bilaterally   Diagnostic Studies CT angiogram has been reviewed by myself. This reveals complete resolution of his penetrating ulcer. The stent graft remains in good position.  Assessment: Status post endovascular repair of a aortic venting ulcer Plan: By CT scan, the above condition has completely resolved. I will try to image this with ultrasound in the future. He will come back in one year. I will try to avoid getting frequent CT scans if it cannot be visualized next year, I would just have him come back the following year with a repeat attempt at ultrasound.  I also had an extensive conversation with the patient regarding his need for weight loss. We discussed starting an exercise program and having a full chemistry profile, including thyroid panel to evaluate his potential explanations for his obesity.  Eldridge Abrahams, M.D. [facility] [place] Office: [phone] Pager:  [phone]

## 2012-03-05 DIAGNOSIS — M25569 Pain in unspecified knee: Secondary | ICD-10-CM | POA: Diagnosis not present

## 2012-03-07 DIAGNOSIS — R7989 Other specified abnormal findings of blood chemistry: Secondary | ICD-10-CM | POA: Diagnosis not present

## 2012-03-07 DIAGNOSIS — R7309 Other abnormal glucose: Secondary | ICD-10-CM | POA: Diagnosis not present

## 2012-03-07 DIAGNOSIS — J069 Acute upper respiratory infection, unspecified: Secondary | ICD-10-CM | POA: Diagnosis not present

## 2012-03-13 DIAGNOSIS — M25569 Pain in unspecified knee: Secondary | ICD-10-CM | POA: Diagnosis not present

## 2012-03-13 DIAGNOSIS — IMO0002 Reserved for concepts with insufficient information to code with codable children: Secondary | ICD-10-CM | POA: Diagnosis not present

## 2012-03-13 DIAGNOSIS — M2240 Chondromalacia patellae, unspecified knee: Secondary | ICD-10-CM | POA: Diagnosis not present

## 2012-03-19 DIAGNOSIS — IMO0002 Reserved for concepts with insufficient information to code with codable children: Secondary | ICD-10-CM | POA: Diagnosis not present

## 2012-03-19 DIAGNOSIS — M2240 Chondromalacia patellae, unspecified knee: Secondary | ICD-10-CM | POA: Diagnosis not present

## 2012-03-19 DIAGNOSIS — M25569 Pain in unspecified knee: Secondary | ICD-10-CM | POA: Diagnosis not present

## 2012-03-25 ENCOUNTER — Other Ambulatory Visit: Payer: Self-pay | Admitting: Cardiology

## 2012-03-29 ENCOUNTER — Ambulatory Visit (INDEPENDENT_AMBULATORY_CARE_PROVIDER_SITE_OTHER): Payer: Medicare Other | Admitting: Urology

## 2012-03-29 DIAGNOSIS — N403 Nodular prostate with lower urinary tract symptoms: Secondary | ICD-10-CM

## 2012-03-29 DIAGNOSIS — C61 Malignant neoplasm of prostate: Secondary | ICD-10-CM | POA: Diagnosis not present

## 2012-03-29 DIAGNOSIS — N138 Other obstructive and reflux uropathy: Secondary | ICD-10-CM | POA: Diagnosis not present

## 2012-03-29 DIAGNOSIS — R972 Elevated prostate specific antigen [PSA]: Secondary | ICD-10-CM | POA: Diagnosis not present

## 2012-04-03 DIAGNOSIS — Z8711 Personal history of peptic ulcer disease: Secondary | ICD-10-CM

## 2012-04-03 HISTORY — DX: Personal history of peptic ulcer disease: Z87.11

## 2012-04-18 ENCOUNTER — Other Ambulatory Visit (HOSPITAL_COMMUNITY): Payer: Self-pay | Admitting: Orthopedic Surgery

## 2012-04-18 DIAGNOSIS — R52 Pain, unspecified: Secondary | ICD-10-CM

## 2012-04-18 DIAGNOSIS — M25569 Pain in unspecified knee: Secondary | ICD-10-CM | POA: Diagnosis not present

## 2012-04-19 ENCOUNTER — Ambulatory Visit (HOSPITAL_COMMUNITY)
Admission: RE | Admit: 2012-04-19 | Discharge: 2012-04-19 | Disposition: A | Discharge status: Home / Self Care | Payer: Medicare Other | Source: Ambulatory Visit | Attending: Orthopedic Surgery | Admitting: Orthopedic Surgery

## 2012-04-19 DIAGNOSIS — M25569 Pain in unspecified knee: Secondary | ICD-10-CM | POA: Insufficient documentation

## 2012-04-19 DIAGNOSIS — S72413A Displaced unspecified condyle fracture of lower end of unspecified femur, initial encounter for closed fracture: Secondary | ICD-10-CM | POA: Diagnosis not present

## 2012-04-19 DIAGNOSIS — M8560 Other cyst of bone, unspecified site: Secondary | ICD-10-CM | POA: Insufficient documentation

## 2012-04-19 DIAGNOSIS — M25469 Effusion, unspecified knee: Secondary | ICD-10-CM | POA: Diagnosis not present

## 2012-04-19 DIAGNOSIS — R52 Pain, unspecified: Secondary | ICD-10-CM

## 2012-04-19 DIAGNOSIS — X58XXXA Exposure to other specified factors, initial encounter: Secondary | ICD-10-CM | POA: Insufficient documentation

## 2012-04-19 DIAGNOSIS — M7120 Synovial cyst of popliteal space [Baker], unspecified knee: Secondary | ICD-10-CM | POA: Insufficient documentation

## 2012-04-23 DIAGNOSIS — M25569 Pain in unspecified knee: Secondary | ICD-10-CM | POA: Diagnosis not present

## 2012-04-26 ENCOUNTER — Encounter (HOSPITAL_COMMUNITY): Payer: Self-pay

## 2012-04-26 ENCOUNTER — Emergency Department (HOSPITAL_COMMUNITY)
Admission: EM | Admit: 2012-04-26 | Discharge: 2012-04-26 | Disposition: A | Discharge status: Home / Self Care | Payer: Medicare Other | Attending: Emergency Medicine | Admitting: Emergency Medicine

## 2012-04-26 ENCOUNTER — Other Ambulatory Visit: Payer: Self-pay

## 2012-04-26 ENCOUNTER — Emergency Department (HOSPITAL_COMMUNITY): Payer: Medicare Other

## 2012-04-26 DIAGNOSIS — Z8589 Personal history of malignant neoplasm of other organs and systems: Secondary | ICD-10-CM | POA: Diagnosis not present

## 2012-04-26 DIAGNOSIS — Z8679 Personal history of other diseases of the circulatory system: Secondary | ICD-10-CM | POA: Diagnosis not present

## 2012-04-26 DIAGNOSIS — F17200 Nicotine dependence, unspecified, uncomplicated: Secondary | ICD-10-CM | POA: Insufficient documentation

## 2012-04-26 DIAGNOSIS — Z8709 Personal history of other diseases of the respiratory system: Secondary | ICD-10-CM | POA: Diagnosis not present

## 2012-04-26 DIAGNOSIS — I2510 Atherosclerotic heart disease of native coronary artery without angina pectoris: Secondary | ICD-10-CM | POA: Insufficient documentation

## 2012-04-26 DIAGNOSIS — J4489 Other specified chronic obstructive pulmonary disease: Secondary | ICD-10-CM | POA: Insufficient documentation

## 2012-04-26 DIAGNOSIS — I129 Hypertensive chronic kidney disease with stage 1 through stage 4 chronic kidney disease, or unspecified chronic kidney disease: Secondary | ICD-10-CM | POA: Insufficient documentation

## 2012-04-26 DIAGNOSIS — J449 Chronic obstructive pulmonary disease, unspecified: Secondary | ICD-10-CM | POA: Diagnosis not present

## 2012-04-26 DIAGNOSIS — I10 Essential (primary) hypertension: Secondary | ICD-10-CM | POA: Diagnosis not present

## 2012-04-26 DIAGNOSIS — R0789 Other chest pain: Secondary | ICD-10-CM | POA: Diagnosis not present

## 2012-04-26 DIAGNOSIS — N189 Chronic kidney disease, unspecified: Secondary | ICD-10-CM | POA: Diagnosis not present

## 2012-04-26 DIAGNOSIS — R079 Chest pain, unspecified: Secondary | ICD-10-CM | POA: Diagnosis not present

## 2012-04-26 DIAGNOSIS — Z79899 Other long term (current) drug therapy: Secondary | ICD-10-CM | POA: Diagnosis not present

## 2012-04-26 DIAGNOSIS — K219 Gastro-esophageal reflux disease without esophagitis: Secondary | ICD-10-CM | POA: Diagnosis not present

## 2012-04-26 DIAGNOSIS — R6889 Other general symptoms and signs: Secondary | ICD-10-CM | POA: Diagnosis not present

## 2012-04-26 DIAGNOSIS — Z8739 Personal history of other diseases of the musculoskeletal system and connective tissue: Secondary | ICD-10-CM | POA: Diagnosis not present

## 2012-04-26 DIAGNOSIS — I119 Hypertensive heart disease without heart failure: Secondary | ICD-10-CM | POA: Diagnosis not present

## 2012-04-26 DIAGNOSIS — R072 Precordial pain: Secondary | ICD-10-CM | POA: Diagnosis not present

## 2012-04-26 LAB — COMPREHENSIVE METABOLIC PANEL
ALT: 17 U/L (ref 0–53)
AST: 17 U/L (ref 0–37)
Albumin: 3.5 g/dL (ref 3.5–5.2)
Alkaline Phosphatase: 76 U/L (ref 39–117)
BUN: 17 mg/dL (ref 6–23)
CO2: 22 mEq/L (ref 19–32)
Calcium: 8.6 mg/dL (ref 8.4–10.5)
Chloride: 108 mEq/L (ref 96–112)
Creatinine, Ser: 1.25 mg/dL (ref 0.50–1.35)
GFR calc Af Amer: 67 mL/min — ABNORMAL LOW (ref >90)
GFR calc non Af Amer: 58 mL/min — ABNORMAL LOW (ref >90)
Glucose, Bld: 97 mg/dL (ref 70–99)
Potassium: 4.2 mEq/L (ref 3.5–5.1)
Sodium: 142 mEq/L (ref 135–145)
Total Bilirubin: 0.3 mg/dL (ref 0.3–1.2)
Total Protein: 6.6 g/dL (ref 6.0–8.3)

## 2012-04-26 LAB — CBC
HCT: 41.5 % (ref 39.0–52.0)
Hemoglobin: 14.4 g/dL (ref 13.0–17.0)
MCH: 30.8 pg (ref 26.0–34.0)
MCHC: 34.7 g/dL (ref 30.0–36.0)
MCV: 88.7 fL (ref 78.0–100.0)
Platelets: 166 10*3/uL (ref 150–400)
RBC: 4.68 MIL/uL (ref 4.22–5.81)
RDW: 13.8 % (ref 11.5–15.5)
WBC: 7.3 10*3/uL (ref 4.0–10.5)

## 2012-04-26 LAB — TROPONIN I: Troponin I: <0.3 ng/mL (ref <0.30)

## 2012-04-26 LAB — PROTIME-INR
INR: 0.95 (ref 0.00–1.49)
Prothrombin Time: 12.6 seconds (ref 11.6–15.2)

## 2012-04-26 LAB — POCT I-STAT TROPONIN I: Troponin i, poc: 0.01 ng/mL (ref 0.00–0.08)

## 2012-04-26 MED ORDER — ASPIRIN 81 MG PO CHEW: 324.0000 mg | CHEWABLE_TABLET | Freq: Once | ORAL | Status: DC

## 2012-04-26 MED ORDER — SODIUM CHLORIDE 0.9 % IV SOLN: 20.0000 mL | INTRAVENOUS | Status: DC

## 2012-04-26 MED ORDER — NITROGLYCERIN 0.4 MG SL SUBL: 0.4000 mg | SUBLINGUAL_TABLET | SUBLINGUAL | Status: DC | PRN

## 2012-04-26 NOTE — ED Notes (Signed)
Patient urine by the bed side.

## 2012-04-26 NOTE — ED Provider Notes (Signed)
History     CSN: [id number]  Arrival date & time 04/26/12  1633   First MD Initiated Contact with Patient 04/26/12 1636      Chief Complaint  Patient presents with  . Chest Pain    (Consider location/radiation/quality/duration/timing/severity/associated sxs/prior treatment) HPI  The patient presents with chest pain.  He states the pain started today, several hours ago, and since onset was present in the left upper chest, with no radiation.  The pain is sore, pressure like, nonradiating.  The pain is not exertional nor pleuritic.  The patient denies concurrent dyspnea, lightheadedness, nausea, vomiting. In route the patient received aspirin and nitroglycerin, with complete resolution of his pain. He states that he was in his usual state of health until the onset of pain. He recalls moving firewood yesterday. No recent cardiac evaluation.   Past Medical History  Diagnosis Date  . Coronary artery disease   . Gout   . Shortness of breath     with exertion  . Chronic kidney disease     stone,   elevated PSA  . GERD (gastroesophageal reflux disease)   . Hypertension     medical clearance  Dr Dennard Schaumann on chart  . COPD (chronic obstructive pulmonary disease)     "collapsed left lung" years ago also  . Sleep apnea     no CPAP- "cant tolerate"  . Anginal pain 6/11    cath and stress test  6/11 on chart- denies any pain since  . Peripheral vascular disease     artherosclerotic ulcer infrarenal aorta--s/p endograft 2004-  followed by yearly scan- last 10/12 EPIC  . Cancer   . AAA (abdominal aortic aneurysm)     Past Surgical History  Procedure Date  . Vascular surgery 2004    endograft - states METAL STENT  . Cardiac catheterization 2011  . Colonoscopy w/ polypectomy   . Hernia repair     umbilical  . Fracture surgery     bilateral lower arms as a child  . Knee arthroscopy     left  . Prostate biopsy 09/26/2011    Procedure: BIOPSY TRANSRECTAL ULTRASONIC PROSTATE (TUBP);   Surgeon: [name], MD;  Location: [facility];  Service: Urology;  Laterality: N/A;      . Circumcision 09/26/2011    Procedure: CIRCUMCISION ADULT;  Surgeon: [name], MD;  Location: [facility];  Service: Urology;  Laterality: N/A;  . Cystoscopy 09/26/2011    Procedure: CYSTOSCOPY;  Surgeon: [name], MD;  Location: [facility];  Service: Urology;  Laterality: N/A;  . Knee surgery Sept. 26, 2013    Torn minisc.- Right  knee    Family History  Problem Relation Age of Onset  . Stroke Mother   . Mesothelioma Father     History  Substance Use Topics  . Smoking status: Current Every Day Smoker -- 1.0 packs/day for 24 years  . Smokeless tobacco: Never Used  . Alcohol Use: No      Review of Systems  Constitutional:       Per HPI, otherwise negative  HENT:       Per HPI, otherwise negative  Eyes: Negative.   Respiratory:       Per HPI, otherwise negative  Cardiovascular:       Per HPI, otherwise negative  Gastrointestinal: Negative for vomiting.  Genitourinary: Negative.   Musculoskeletal:       Per HPI, otherwise negative  Skin: Negative.   Neurological: Negative for syncope.  Allergies  Morphine sulfate; Sulfacetamide sodium; and Sulfamethoxazole w-trimethoprim  Home Medications   Current Outpatient Rx  Name  Route  Sig  Dispense  Refill  . ALLOPURINOL 100 MG PO TABS   Oral   Take 100 mg by mouth 3 (three) times daily.          [name] ALPRAZOLAM 0.5 MG PO TABS   Oral   Take 0.5 mg by mouth at bedtime.         [name] AMLODIPINE BESYLATE 10 MG PO TABS   Oral   Take 10 mg by mouth at bedtime.         [name] CARVEDILOL 25 MG PO TABS      TAKE ONE TABLET BY MOUTH TWICE DAILY   60 tablet   4   . CLONIDINE HCL 0.1 MG PO TABS   Oral   Take 0.1 mg by mouth 2 (two) times daily.         . INDOMETHACIN 50 MG PO CAPS   Oral   Take 50 mg by mouth at bedtime.          [name] LOSARTAN POTASSIUM 50 MG PO TABS   Oral   Take 50 mg by mouth daily.         . MELOXICAM 15  MG PO TABS               . PANTOPRAZOLE SODIUM 40 MG PO TBEC   Oral   Take 40 mg by mouth at bedtime.          [name] POTASSIUM CHLORIDE CRYS ER 20 MEQ PO TBCR   Oral   Take 40 mEq by mouth 2 (two) times daily.         [name] PRAVASTATIN SODIUM 40 MG PO TABS   Oral   Take 40 mg by mouth at bedtime.         . SPIRONOLACTONE 25 MG PO TABS   Oral   Take 12.5 mg by mouth at bedtime. Takes 1/2 tablet           BP 184/88  Pulse 78  Temp 97.6 F (36.4 C) (Oral)  Resp 19  SpO2 99%  Physical Exam  Nursing note and vitals reviewed. Constitutional: He is oriented to person, place, and time. He appears well-developed. No distress.  HENT:  Head: Normocephalic and atraumatic.  Eyes: Conjunctivae normal and EOM are normal.  Cardiovascular: Normal rate and regular rhythm.   Pulmonary/Chest: Effort normal. No stridor. No respiratory distress.  Abdominal: He exhibits no distension.  Musculoskeletal: He exhibits no edema.  Neurological: He is alert and oriented to person, place, and time.  Skin: Skin is warm and dry.  Psychiatric: He has a normal mood and affect.    ED Course  Procedures (including critical care time)   Labs Reviewed  CBC  PROTIME-INR  COMPREHENSIVE METABOLIC PANEL   Dg Chest 2 View  04/26/2012  *RADIOLOGY REPORT*  Clinical Data: Chest pain  CHEST - 2 VIEW  Comparison: 10/21/2011  Findings: Cardiomediastinal silhouette is stable.  No acute infiltrate or pleural effusion.  No pulmonary edema.  Stable degenerative changes thoracic spine.  IMPRESSION: No active disease.  No significant change.   Original Report Authenticated By: Lahoma Crocker, M.D.      No diagnosis found.  Cardiac 70 sinus rhythm normal Pulse ox 99% room air normal   A review of the patient's EMS EKG, demonstrates similar findings to the EKG performed here  Date: 04/26/2012  Rate:  69  Rhythm: normal sinus rhythm  QRS Axis: left  Intervals: PR prolonged  ST/T Wave abnormalities:  nonspecific T wave changes  Conduction Disutrbances:first-degree A-V block  and left anterior fascicular block  Narrative Interpretation:   Old EKG Reviewed: unchanged ABNORMAL   2300: I informed the patient will results, including the second troponin I was negative.  I reiterated the medical indication for additional testing, including additional labs and also provocative testing.  The patient reiterated his desire to go home.  MDM  This patient presents with left-sided chest pain, resolved by the time of my evaluation.  The patient's pain maybe 2 to his musculoskeletal strain, following strenuous exercise yesterday, given the absence of prior cardiac evaluation, his risk factors I advocated for overnight stay, for further evaluation of cardiac etiology.  The patient deferred this, has capacity to do so.  He did agree to stay for second round of troponins.  His wife is present throughout these discussions.  Following 2 negative troponins, and with no recurrence of chest pain, and with unremarkable labs, the patient was discharged after long discussion on the need for continued outpatient followup.        Carmin Muskrat, MD 04/27/12 [phone]

## 2012-04-26 NOTE — ED Notes (Signed)
Per ems- pt awoke this am with "aching" substernal cp, pt attributed pain with muscular pain as he was carrying firewood yesterday. Pt went to md office as pain did not go away, pt given 1 nitro and 325asa and pain is now relieved. Pt 12 lead unremarkable. 20g IV in LAC. Denies any associated symptoms. BP-165/109 HR-72 NSR RR-18 O2-96% on RA

## 2012-04-26 NOTE — ED Notes (Signed)
Report to Johnson Siding, rn.  Pt transported to pod c.  Supper tray ordered for pt by Network engineer.

## 2012-04-26 NOTE — ED Notes (Signed)
Placed call for diet tray

## 2012-04-26 NOTE — ED Notes (Signed)
C/O chest pain in left lateral chest.  States that he did some heavy lifting yesterday and thought it was muscle pain.  Went to See PCP today a 3P.  He was given 4 baby asprin at MD's office and NTG in the ambulance. States that the pain went away quickly.

## 2012-04-26 NOTE — ED Notes (Signed)
DR. Kirby Funk ( EDP) SPOKE WITH PT. ON RESULTS OF TEST AND DISCHARGE PLAN .

## 2012-05-08 DIAGNOSIS — M23329 Other meniscus derangements, posterior horn of medial meniscus, unspecified knee: Secondary | ICD-10-CM | POA: Diagnosis not present

## 2012-05-08 DIAGNOSIS — M959 Acquired deformity of musculoskeletal system, unspecified: Secondary | ICD-10-CM | POA: Diagnosis not present

## 2012-05-08 DIAGNOSIS — IMO0002 Reserved for concepts with insufficient information to code with codable children: Secondary | ICD-10-CM | POA: Diagnosis not present

## 2012-05-11 ENCOUNTER — Encounter (HOSPITAL_COMMUNITY): Payer: Self-pay

## 2012-05-11 ENCOUNTER — Emergency Department (HOSPITAL_COMMUNITY): Payer: Medicare Other

## 2012-05-11 ENCOUNTER — Emergency Department (HOSPITAL_COMMUNITY)
Admission: EM | Admit: 2012-05-11 | Discharge: 2012-05-11 | Disposition: A | Discharge status: Home / Self Care | Payer: Medicare Other | Attending: Emergency Medicine | Admitting: Emergency Medicine

## 2012-05-11 DIAGNOSIS — M109 Gout, unspecified: Secondary | ICD-10-CM | POA: Insufficient documentation

## 2012-05-11 DIAGNOSIS — Z8679 Personal history of other diseases of the circulatory system: Secondary | ICD-10-CM | POA: Insufficient documentation

## 2012-05-11 DIAGNOSIS — Z791 Long term (current) use of non-steroidal anti-inflammatories (NSAID): Secondary | ICD-10-CM | POA: Insufficient documentation

## 2012-05-11 DIAGNOSIS — N189 Chronic kidney disease, unspecified: Secondary | ICD-10-CM | POA: Insufficient documentation

## 2012-05-11 DIAGNOSIS — R072 Precordial pain: Secondary | ICD-10-CM | POA: Diagnosis not present

## 2012-05-11 DIAGNOSIS — K219 Gastro-esophageal reflux disease without esophagitis: Secondary | ICD-10-CM | POA: Diagnosis not present

## 2012-05-11 DIAGNOSIS — I209 Angina pectoris, unspecified: Secondary | ICD-10-CM | POA: Insufficient documentation

## 2012-05-11 DIAGNOSIS — F17200 Nicotine dependence, unspecified, uncomplicated: Secondary | ICD-10-CM | POA: Insufficient documentation

## 2012-05-11 DIAGNOSIS — Z859 Personal history of malignant neoplasm, unspecified: Secondary | ICD-10-CM | POA: Diagnosis not present

## 2012-05-11 DIAGNOSIS — R079 Chest pain, unspecified: Secondary | ICD-10-CM | POA: Diagnosis not present

## 2012-05-11 DIAGNOSIS — R0602 Shortness of breath: Secondary | ICD-10-CM | POA: Diagnosis not present

## 2012-05-11 DIAGNOSIS — Z79899 Other long term (current) drug therapy: Secondary | ICD-10-CM | POA: Diagnosis not present

## 2012-05-11 DIAGNOSIS — J4489 Other specified chronic obstructive pulmonary disease: Secondary | ICD-10-CM | POA: Insufficient documentation

## 2012-05-11 DIAGNOSIS — I2510 Atherosclerotic heart disease of native coronary artery without angina pectoris: Secondary | ICD-10-CM | POA: Diagnosis not present

## 2012-05-11 DIAGNOSIS — I129 Hypertensive chronic kidney disease with stage 1 through stage 4 chronic kidney disease, or unspecified chronic kidney disease: Secondary | ICD-10-CM | POA: Insufficient documentation

## 2012-05-11 DIAGNOSIS — J449 Chronic obstructive pulmonary disease, unspecified: Secondary | ICD-10-CM | POA: Diagnosis not present

## 2012-05-11 LAB — CBC WITH DIFFERENTIAL/PLATELET
Basophils Absolute: 0.1 10*3/uL (ref 0.0–0.1)
Basophils Relative: 1 % (ref 0–1)
Eosinophils Absolute: 0.4 10*3/uL (ref 0.0–0.7)
Eosinophils Relative: 5 % (ref 0–5)
HCT: 42.4 % (ref 39.0–52.0)
Hemoglobin: 14.7 g/dL (ref 13.0–17.0)
Lymphocytes Relative: 20 % (ref 12–46)
Lymphs Abs: 1.7 10*3/uL (ref 0.7–4.0)
MCH: 30.8 pg (ref 26.0–34.0)
MCHC: 34.7 g/dL (ref 30.0–36.0)
MCV: 88.7 fL (ref 78.0–100.0)
Monocytes Absolute: 0.6 10*3/uL (ref 0.1–1.0)
Monocytes Relative: 8 % (ref 3–12)
Neutro Abs: 5.5 10*3/uL (ref 1.7–7.7)
Neutrophils Relative %: 67 % (ref 43–77)
Platelets: 245 10*3/uL (ref 150–400)
RBC: 4.78 MIL/uL (ref 4.22–5.81)
RDW: 14 % (ref 11.5–15.5)
WBC: 8.2 10*3/uL (ref 4.0–10.5)

## 2012-05-11 LAB — COMPREHENSIVE METABOLIC PANEL
ALT: 16 U/L (ref 0–53)
AST: 16 U/L (ref 0–37)
Albumin: 4.1 g/dL (ref 3.5–5.2)
Alkaline Phosphatase: 83 U/L (ref 39–117)
BUN: 24 mg/dL — ABNORMAL HIGH (ref 6–23)
CO2: 24 mEq/L (ref 19–32)
Calcium: 9.6 mg/dL (ref 8.4–10.5)
Chloride: 101 mEq/L (ref 96–112)
Creatinine, Ser: 1.54 mg/dL — ABNORMAL HIGH (ref 0.50–1.35)
GFR calc Af Amer: 52 mL/min — ABNORMAL LOW (ref >90)
GFR calc non Af Amer: 45 mL/min — ABNORMAL LOW (ref >90)
Glucose, Bld: 88 mg/dL (ref 70–99)
Potassium: 3.7 mEq/L (ref 3.5–5.1)
Sodium: 138 mEq/L (ref 135–145)
Total Bilirubin: 0.3 mg/dL (ref 0.3–1.2)
Total Protein: 7.7 g/dL (ref 6.0–8.3)

## 2012-05-11 LAB — D-DIMER, QUANTITATIVE: D-Dimer, Quant: 0.56 ug/mL-FEU — ABNORMAL HIGH (ref 0.00–0.48)

## 2012-05-11 LAB — TROPONIN I: Troponin I: <0.3 ng/mL (ref <0.30)

## 2012-05-11 MED ORDER — GI COCKTAIL ~~LOC~~
30.0000 mL | Freq: Once | ORAL | Status: AC
  Administered 2012-05-11: 30 mL via ORAL
  Filled 2012-05-11: qty 30

## 2012-05-11 MED ORDER — IOHEXOL 350 MG/ML SOLN
100.0000 mL | Freq: Once | INTRAVENOUS | Status: AC | PRN
  Administered 2012-05-11: 100 mL via INTRAVENOUS

## 2012-05-11 NOTE — ED Notes (Signed)
Pt reports waking this am with sob/cp mid chest with no radiation. No nausea no vomiting. No cough. Had knee surgery yesterday. Afraid may be a clot.

## 2012-05-11 NOTE — ED Notes (Signed)
Back from ct.

## 2012-05-11 NOTE — ED Provider Notes (Signed)
History     CSN: [id number]  Arrival date & time 05/11/12  38   First MD Initiated Contact with Patient 05/11/12 1755      Chief Complaint  Patient presents with  . Shortness of Breath  . Chest Pain    (Consider location/radiation/quality/duration/timing/severity/associated sxs/prior treatment) Patient is a 67 y.o. male presenting with chest pain. The history is provided by the patient (the pt complains of chest pain at his xyphoid). No language interpreter was used.  Chest Pain Pain location:  Epigastric Pain quality: burning   Pain radiates to:  Does not radiate Pain radiates to the back: no   Pain severity:  Moderate Onset quality:  Gradual Timing:  Constant Progression:  Partially resolved Chronicity:  New Context: not breathing   Associated symptoms: no abdominal pain, no back pain, no cough, no fatigue and no headache     Past Medical History  Diagnosis Date  . Coronary artery disease   . Gout   . Shortness of breath     with exertion  . Chronic kidney disease     stone,   elevated PSA  . GERD (gastroesophageal reflux disease)   . Hypertension     medical clearance  Dr Dennard Schaumann on chart  . COPD (chronic obstructive pulmonary disease)     "collapsed left lung" years ago also  . Sleep apnea     no CPAP- "cant tolerate"  . Anginal pain 6/11    cath and stress test  6/11 on chart- denies any pain since  . Peripheral vascular disease     artherosclerotic ulcer infrarenal aorta--s/p endograft 2004-  followed by yearly scan- last 10/12 EPIC  . Cancer   . AAA (abdominal aortic aneurysm)     Past Surgical History  Procedure Laterality Date  . Vascular surgery  2004    endograft - states METAL STENT  . Cardiac catheterization  2011  . Colonoscopy w/ polypectomy    . Hernia repair      umbilical  . Fracture surgery      bilateral lower arms as a child  . Knee arthroscopy      left  . Prostate biopsy  09/26/2011    Procedure: BIOPSY TRANSRECTAL ULTRASONIC  PROSTATE (TUBP);  Surgeon: [name], MD;  Location: [facility];  Service: Urology;  Laterality: N/A;      . Circumcision  09/26/2011    Procedure: CIRCUMCISION ADULT;  Surgeon: [name], MD;  Location: [facility];  Service: Urology;  Laterality: N/A;  . Cystoscopy  09/26/2011    Procedure: CYSTOSCOPY;  Surgeon: [name], MD;  Location: [facility];  Service: Urology;  Laterality: N/A;  . Knee surgery  Sept. 26, 2013    Torn minisc.- Right  knee    Family History  Problem Relation Age of Onset  . Stroke Mother   . Mesothelioma Father     History  Substance Use Topics  . Smoking status: Current Every Day Smoker -- 1.00 packs/day for 24 years  . Smokeless tobacco: Never Used  . Alcohol Use: No      Review of Systems  Constitutional: Negative for fatigue.  HENT: Negative for congestion, sinus pressure and ear discharge.   Eyes: Negative for discharge.  Respiratory: Negative for cough.   Cardiovascular: Positive for chest pain.  Gastrointestinal: Negative for abdominal pain and diarrhea.  Genitourinary: Negative for frequency and hematuria.  Musculoskeletal: Negative for back pain.  Skin: Negative for rash.  Neurological: Negative for seizures  and headaches.  Psychiatric/Behavioral: Negative for hallucinations.    Allergies  Morphine sulfate; Sulfacetamide sodium; and Sulfamethoxazole w-trimethoprim  Home Medications   Current Outpatient Rx  Name  Route  Sig  Dispense  Refill  . amLODipine (NORVASC) 10 MG tablet   Oral   Take 10 mg by mouth every evening.          . carvedilol (COREG) 25 MG tablet   Oral   Take 25 mg by mouth every evening.         . cloNIDine (CATAPRES) 0.1 MG tablet   Oral   Take 0.1 mg by mouth every evening.          . indomethacin (INDOCIN) 50 MG capsule   Oral   Take 50 mg by mouth every evening.          [name] losartan (COZAAR) 50 MG tablet   Oral   Take 50 mg by mouth every evening.          . pantoprazole (PROTONIX) 40 MG  tablet   Oral   Take 40 mg by mouth every evening.          . pravastatin (PRAVACHOL) 40 MG tablet   Oral   Take 40 mg by mouth every evening.          [name] spironolactone (ALDACTONE) 25 MG tablet   Oral   Take 12.5 mg by mouth every evening. Takes 1/2 tablet         . oxyCODONE-acetaminophen (PERCOCET/ROXICET) 5-325 MG per tablet   Oral   Take 1 tablet by mouth every 8 (eight) hours as needed. For pain           BP 144/75  Pulse 62  Temp(Src) 97.2 F (36.2 C) (Oral)  Resp 20  Ht 5\' 8"  (1.727 m)  Wt 272 lb (123.378 kg)  BMI 41.37 kg/m2  SpO2 94%  Physical Exam  Constitutional: He is oriented to person, place, and time. He appears well-developed.  HENT:  Head: Normocephalic and atraumatic.  Eyes: Conjunctivae and EOM are normal. No scleral icterus.  Neck: Neck supple. No thyromegaly present.  Cardiovascular: Normal rate and regular rhythm.  Exam reveals no gallop and no friction rub.   No murmur heard. Pulmonary/Chest: No stridor. He has no wheezes. He has no rales. He exhibits no tenderness.  Abdominal: He exhibits no distension. There is tenderness. There is no rebound.  Tender epigastric  Musculoskeletal: Normal range of motion. He exhibits no edema.  Lymphadenopathy:    He has no cervical adenopathy.  Neurological: He is oriented to person, place, and time. Coordination normal.  Skin: No rash noted. No erythema.  Psychiatric: He has a normal mood and affect. His behavior is normal.    ED Course  Procedures (including critical care time)  Labs Reviewed  COMPREHENSIVE METABOLIC PANEL - Abnormal; Notable for the following:    BUN 24 (*)    Creatinine, Ser 1.54 (*)    GFR calc non Af Amer 45 (*)    GFR calc Af Amer 52 (*)    All other components within normal limits  D-DIMER, QUANTITATIVE - Abnormal; Notable for the following:    D-Dimer, Quant 0.56 (*)    All other components within normal limits  CBC WITH DIFFERENTIAL  TROPONIN I   Ct Angio Chest  Pe W/cm &/or Wo Cm  05/11/2012  *RADIOLOGY REPORT*  Clinical Data: Short of breath.  Rule out pulmonary embolism.  CT ANGIOGRAPHY CHEST  Technique:  Multidetector CT imaging of the chest using the standard protocol during bolus administration of intravenous contrast. Multiplanar reconstructed images including MIPs were obtained and reviewed to evaluate the vascular anatomy.  Contrast: 173mL OMNIPAQUE IOHEXOL 350 MG/ML SOLN  Comparison: CT chest 09/22/2011  Findings: Negative for pulmonary embolism.  Negative for aortic dissection or aneurysm.  Mild coronary artery calcification.  Heart size is within normal limits and there is no pericardial effusion.  Mild dependent atelectasis in the lung base.  Mild scarring right upper lobe.  No acute infiltrate or effusion.  No mass or adenopathy.  Small lung nodules seen on the prior study are not visualized however there is atelectasis on the study which could obscure small nodules.  IMPRESSION: Negative for pulmonary embolism.  Mild atelectasis in the lung bases.   Original Report Authenticated By: Carl Best, M.D.    Dg Chest Portable 1 View  05/11/2012  *RADIOLOGY REPORT*  Clinical Data: Short of breath and chest pain  PORTABLE CHEST - 1 VIEW  Comparison: 04/26/2012  Findings: Lungs are clear without infiltrate or effusion.  Negative for heart failure.  Negative for mass lesion.  IMPRESSION: No acute abnormality.   Original Report Authenticated By: Carl Best, M.D.      1. GERD (gastroesophageal reflux disease)     Date: 05/11/2012  Rate:65  Rhythm: normal sinus rhythm  QRS Axis  left  Intervals: normal  ST/T Wave abnormalities: normal  Conduction Disutrbances:none  Narrative Interpretation:   Old EKG Reviewed: unchanged     MDM    Pt improved with gi cocktail.         Maudry Diego, MD 05/11/12 2052

## 2012-05-23 DIAGNOSIS — M25569 Pain in unspecified knee: Secondary | ICD-10-CM | POA: Diagnosis not present

## 2012-05-23 DIAGNOSIS — IMO0002 Reserved for concepts with insufficient information to code with codable children: Secondary | ICD-10-CM | POA: Diagnosis not present

## 2012-05-27 ENCOUNTER — Telehealth: Payer: Self-pay

## 2012-05-27 DIAGNOSIS — C61 Malignant neoplasm of prostate: Secondary | ICD-10-CM | POA: Diagnosis not present

## 2012-05-27 NOTE — Telephone Encounter (Signed)
Pt. Called office to ask if Dr. Trula Slade was able to get information about the Stent Graft Repair of AAA being compatible w/ MRI.  Spoke w/ Dr. Trula Slade.  Stated the pt. had an Endologix stent, and according to the representative, it is 100 % compatible w/ MRI.  Left message with pt. re: above information.

## 2012-05-29 DIAGNOSIS — J40 Bronchitis, not specified as acute or chronic: Secondary | ICD-10-CM | POA: Diagnosis not present

## 2012-05-29 DIAGNOSIS — R0989 Other specified symptoms and signs involving the circulatory and respiratory systems: Secondary | ICD-10-CM | POA: Diagnosis not present

## 2012-05-29 DIAGNOSIS — R0609 Other forms of dyspnea: Secondary | ICD-10-CM | POA: Diagnosis not present

## 2012-05-31 ENCOUNTER — Ambulatory Visit (INDEPENDENT_AMBULATORY_CARE_PROVIDER_SITE_OTHER): Payer: Medicare Other | Admitting: Urology

## 2012-05-31 DIAGNOSIS — C61 Malignant neoplasm of prostate: Secondary | ICD-10-CM | POA: Diagnosis not present

## 2012-05-31 DIAGNOSIS — R972 Elevated prostate specific antigen [PSA]: Secondary | ICD-10-CM | POA: Diagnosis not present

## 2012-05-31 DIAGNOSIS — F411 Generalized anxiety disorder: Secondary | ICD-10-CM | POA: Diagnosis not present

## 2012-06-14 DIAGNOSIS — I10 Essential (primary) hypertension: Secondary | ICD-10-CM | POA: Diagnosis not present

## 2012-06-14 DIAGNOSIS — R05 Cough: Secondary | ICD-10-CM | POA: Diagnosis not present

## 2012-06-14 DIAGNOSIS — R059 Cough, unspecified: Secondary | ICD-10-CM | POA: Diagnosis not present

## 2012-07-11 ENCOUNTER — Other Ambulatory Visit: Payer: Self-pay | Admitting: Family Medicine

## 2012-07-11 ENCOUNTER — Other Ambulatory Visit: Payer: Self-pay | Admitting: Cardiovascular Disease

## 2012-07-11 ENCOUNTER — Other Ambulatory Visit (HOSPITAL_COMMUNITY): Payer: Self-pay | Admitting: Family Medicine

## 2012-07-12 ENCOUNTER — Other Ambulatory Visit: Payer: Self-pay | Admitting: Family Medicine

## 2012-07-12 MED ORDER — PRAVASTATIN SODIUM 40 MG PO TABS: 40.0000 mg | ORAL_TABLET | Freq: Every evening | ORAL | Status: DC

## 2012-07-12 NOTE — Telephone Encounter (Signed)
Sent to [place] from [employer]. Please advise. Thank you

## 2012-07-12 NOTE — Telephone Encounter (Signed)
Dr Jacelyn Grip This patient transferred up there with you.

## 2012-07-15 NOTE — Telephone Encounter (Signed)
Chart says losartan 2 daily, request is for one daily. Chart is sent to you

## 2012-07-29 ENCOUNTER — Ambulatory Visit (INDEPENDENT_AMBULATORY_CARE_PROVIDER_SITE_OTHER): Payer: Medicare Other

## 2012-07-29 ENCOUNTER — Ambulatory Visit (INDEPENDENT_AMBULATORY_CARE_PROVIDER_SITE_OTHER): Payer: Medicare Other | Admitting: Family Medicine

## 2012-07-29 ENCOUNTER — Encounter: Payer: Self-pay | Admitting: Family Medicine

## 2012-07-29 VITALS — BP 161/104 | HR 60 | Temp 96.8°F | Ht 67.0 in | Wt 274.0 lb

## 2012-07-29 DIAGNOSIS — N200 Calculus of kidney: Secondary | ICD-10-CM | POA: Diagnosis not present

## 2012-07-29 DIAGNOSIS — R350 Frequency of micturition: Secondary | ICD-10-CM

## 2012-07-29 DIAGNOSIS — M549 Dorsalgia, unspecified: Secondary | ICD-10-CM | POA: Diagnosis not present

## 2012-07-29 DIAGNOSIS — N138 Other obstructive and reflux uropathy: Secondary | ICD-10-CM

## 2012-07-29 DIAGNOSIS — N182 Chronic kidney disease, stage 2 (mild): Secondary | ICD-10-CM

## 2012-07-29 DIAGNOSIS — I10 Essential (primary) hypertension: Secondary | ICD-10-CM

## 2012-07-29 DIAGNOSIS — Z87442 Personal history of urinary calculi: Secondary | ICD-10-CM

## 2012-07-29 DIAGNOSIS — K219 Gastro-esophageal reflux disease without esophagitis: Secondary | ICD-10-CM

## 2012-07-29 DIAGNOSIS — E785 Hyperlipidemia, unspecified: Secondary | ICD-10-CM

## 2012-07-29 DIAGNOSIS — N401 Enlarged prostate with lower urinary tract symptoms: Secondary | ICD-10-CM

## 2012-07-29 DIAGNOSIS — F411 Generalized anxiety disorder: Secondary | ICD-10-CM | POA: Diagnosis not present

## 2012-07-29 DIAGNOSIS — M109 Gout, unspecified: Secondary | ICD-10-CM

## 2012-07-29 DIAGNOSIS — I5032 Chronic diastolic (congestive) heart failure: Secondary | ICD-10-CM

## 2012-07-29 LAB — POCT URINALYSIS DIPSTICK
Bilirubin, UA: NEGATIVE
Blood, UA: NEGATIVE
Glucose, UA: NEGATIVE
Ketones, UA: NEGATIVE
Nitrite, UA: NEGATIVE
Protein, UA: NEGATIVE
Spec Grav, UA: 1.02
Urobilinogen, UA: NEGATIVE
pH, UA: 5

## 2012-07-29 LAB — POCT UA - MICROSCOPIC ONLY
Casts, Ur, LPF, POC: NEGATIVE
Crystals, Ur, HPF, POC: NEGATIVE
Mucus, UA: NEGATIVE
Yeast, UA: NEGATIVE

## 2012-07-29 MED ORDER — OXYCODONE-ACETAMINOPHEN 5-325 MG PO TABS: 1.0000 | ORAL_TABLET | Freq: Three times a day (TID) | ORAL | Status: DC | PRN

## 2012-07-29 NOTE — Progress Notes (Signed)
Patient ID: [name], male   DOB: 1946/02/21, 67 y.o.   MRN: [id number]  SUBJECTIVE: HPI: Recently seen. Right CVA tenderness and pain. Comes and goes.. No hematuria, but urine is dark. . Has a h/o kidney stones especially on the right side. Also, paranoid about kidney cancer due to several family members with h/o kidney cancers.  PMH/PSH: reviewed/updated in Epic  SH/FH: reviewed/updated in Epic  Allergies: reviewed/updated in Epic  Medications: reviewed/updated in Epic  Immunizations: reviewed/updated in Epic  ROS: As above in the HPI. All other systems are stable or negative.  OBJECTIVE: APPEARANCE:  Patient in no acute distress.The patient appeared well nourished and normally developed. Acyanotic. Waist: obese VITAL SIGNS:BP 161/104  Pulse 60  Temp(Src) 96.8 F (36 C) (Oral)  Ht 5\' 7"  (1.702 m)  Wt 274 lb (124.286 kg)  BMI 42.9 kg/m2   SKIN: warm and  Dry without overt rashes, tattoos and scars  HEAD and Neck: without JVD, Head and scalp: normal Eyes:No scleral icterus. Fundi normal, eye movements normal. Ears: Auricle normal, canal normal, Tympanic membranes normal, insufflation normal. Nose: normal Throat: normal Neck & thyroid: normal  CHEST & LUNGS: Chest wall: normal Lungs: Clear  CVS: Reveals the PMI to be normally located. Regular rhythm, First and Second Heart sounds are normal,  absence of murmurs, rubs or gallops. Peripheral vasculature: Radial pulses: normal Dorsal pedis pulses: normal Posterior pulses: normal  ABDOMEN:  Appearance: obeseBenign,, no organomegaly, no masses, no Abdominal Aortic enlargement. No Guarding , no rebound. No Bruits. Bowel sounds: normal Right CVa tenderness mild.  RECTAL: N/A GU: N/A  EXTREMETIES: nonedematous. Both Femoral and Pedal pulses are normal.  MUSCULOSKELETAL:  Spine: normal Joints: intact  NEUROLOGIC: oriented to time,place and person; nonfocal. Strength is  normal   ASSESSMENT: Back pain - Plan: DG Abd 1 View, oxyCODONE-acetaminophen (PERCOCET/ROXICET) 5-325 MG per tablet, CT Abdomen Pelvis Wo Contrast  Kidney stone - Plan: oxyCODONE-acetaminophen (PERCOCET/ROXICET) 5-325 MG per tablet, CT Abdomen Pelvis Wo Contrast  Frequency of urination - Plan: POCT UA - Microscopic Only, POCT urinalysis dipstick, DG Abd 1 View, oxyCODONE-acetaminophen (PERCOCET/ROXICET) 5-325 MG per tablet, CT Abdomen Pelvis Wo Contrast  ANXIETY  BENIGN PROSTATIC HYPERTROPHY, WITH OBSTRUCTION  Chronic diastolic heart failure  CHRONIC KIDNEY DISEASE STAGE II (MILD)  GERD  GOUT  HYPERLIPIDEMIA  HYPERTENSION  NEPHROLITHIASIS, HX OF    PLAN: Meds ordered this encounter  Medications  . zolpidem (AMBIEN) 5 MG tablet    Sig: Take 5 mg by mouth as needed.  . VOLTAREN 1 % GEL    Sig:   . oxyCODONE-acetaminophen (PERCOCET/ROXICET) 5-325 MG per tablet    Sig: Take 1 tablet by mouth every 8 (eight) hours as needed. For pain    Dispense:  30 tablet    Refill:  0   Orders Placed This Encounter  Procedures  . DG Abd 1 View    Standing Status: Future     Number of Occurrences: 1     Standing Expiration Date: 09/28/2013    Order Specific Question:  Reason for Exam (SYMPTOM  OR DIAGNOSIS REQUIRED)    Answer:  kidney stone historuy    Order Specific Question:  Reason for Exam (SYMPTOM  OR DIAGNOSIS REQUIRED)    Answer:  right back pain.    Order Specific Question:  Preferred imaging location?    Answer:  [facility]  . CT Abdomen Pelvis Wo Contrast    Standing Status: Future     Number of Occurrences:  Standing Expiration Date: 10/28/2013    Order Specific Question:  Reason for Exam (SYMPTOM  OR DIAGNOSIS REQUIRED)    Answer:  h/o kidney stones, right cva pain    Order Specific Question:  Preferred imaging location?    Answer:  [facility]  . POCT UA - Microscopic Only  . POCT urinalysis dipstick   Patient sent for stat Ct urogram. Increase  fluids. Await CT result.  [facility] reading (PRIMARY) by  Dr.Kyara Boxer: KUB: right renal shadow appears smaller than the left. ? renal calculi.  [name] P. Jacelyn Grip, M.D.

## 2012-07-29 NOTE — Patient Instructions (Signed)
      Dr Obadiah Dennard's Recommendations  Diet and Exercise discussed with patient.  For nutrition information, I recommend books:  1).Eat to Live by Dr Joel Fuhrman. 2).Prevent and Reverse Heart Disease by Dr Caldwell Esselstyn.  Exercise recommendations are:  If unable to walk, then the patient can exercise in a chair 3 times a day. By flapping arms like a bird gently and raising legs outwards to the front.  If ambulatory, the patient can go for walks for 30 minutes 3 times a week. Then increase the intensity and duration as tolerated.  Goal is to try to attain exercise frequency to 5 times a week.  If applicable: Best to perform resistance exercises (machines or weights) 2 days a week and cardio type exercises 3 days per week.  

## 2012-07-30 ENCOUNTER — Ambulatory Visit (HOSPITAL_COMMUNITY)
Admission: RE | Admit: 2012-07-30 | Discharge: 2012-07-30 | Disposition: A | Discharge status: Home / Self Care | Payer: Medicare Other | Source: Ambulatory Visit | Attending: Family Medicine | Admitting: Family Medicine

## 2012-07-30 DIAGNOSIS — N200 Calculus of kidney: Secondary | ICD-10-CM

## 2012-07-30 DIAGNOSIS — R350 Frequency of micturition: Secondary | ICD-10-CM

## 2012-07-30 DIAGNOSIS — M549 Dorsalgia, unspecified: Secondary | ICD-10-CM

## 2012-07-30 DIAGNOSIS — R1031 Right lower quadrant pain: Secondary | ICD-10-CM | POA: Diagnosis not present

## 2012-08-11 ENCOUNTER — Other Ambulatory Visit: Payer: Self-pay | Admitting: Cardiovascular Disease

## 2012-08-14 ENCOUNTER — Ambulatory Visit (INDEPENDENT_AMBULATORY_CARE_PROVIDER_SITE_OTHER): Payer: Medicare Other | Admitting: Family Medicine

## 2012-08-14 ENCOUNTER — Encounter: Payer: Self-pay | Admitting: Family Medicine

## 2012-08-14 VITALS — BP 138/90 | HR 66 | Temp 97.0°F | Wt 272.0 lb

## 2012-08-14 DIAGNOSIS — S39012A Strain of muscle, fascia and tendon of lower back, initial encounter: Secondary | ICD-10-CM

## 2012-08-14 DIAGNOSIS — L821 Other seborrheic keratosis: Secondary | ICD-10-CM | POA: Diagnosis not present

## 2012-08-14 DIAGNOSIS — S335XXA Sprain of ligaments of lumbar spine, initial encounter: Secondary | ICD-10-CM

## 2012-08-14 DIAGNOSIS — M1710 Unilateral primary osteoarthritis, unspecified knee: Secondary | ICD-10-CM

## 2012-08-14 DIAGNOSIS — L919 Hypertrophic disorder of the skin, unspecified: Secondary | ICD-10-CM

## 2012-08-14 DIAGNOSIS — E669 Obesity, unspecified: Secondary | ICD-10-CM | POA: Diagnosis not present

## 2012-08-14 DIAGNOSIS — F17200 Nicotine dependence, unspecified, uncomplicated: Secondary | ICD-10-CM

## 2012-08-14 DIAGNOSIS — I10 Essential (primary) hypertension: Secondary | ICD-10-CM

## 2012-08-14 DIAGNOSIS — G4733 Obstructive sleep apnea (adult) (pediatric): Secondary | ICD-10-CM

## 2012-08-14 DIAGNOSIS — R7309 Other abnormal glucose: Secondary | ICD-10-CM

## 2012-08-14 DIAGNOSIS — N200 Calculus of kidney: Secondary | ICD-10-CM

## 2012-08-14 DIAGNOSIS — L909 Atrophic disorder of skin, unspecified: Secondary | ICD-10-CM

## 2012-08-14 DIAGNOSIS — R7303 Prediabetes: Secondary | ICD-10-CM

## 2012-08-14 DIAGNOSIS — M170 Bilateral primary osteoarthritis of knee: Secondary | ICD-10-CM | POA: Insufficient documentation

## 2012-08-14 DIAGNOSIS — G2581 Restless legs syndrome: Secondary | ICD-10-CM

## 2012-08-14 DIAGNOSIS — L918 Other hypertrophic disorders of the skin: Secondary | ICD-10-CM

## 2012-08-14 MED ORDER — CYCLOBENZAPRINE HCL 5 MG PO TABS: 5.0000 mg | ORAL_TABLET | Freq: Three times a day (TID) | ORAL | Status: DC | PRN

## 2012-08-14 MED ORDER — KETOROLAC TROMETHAMINE 60 MG/2ML IM SOLN
60.0000 mg | Freq: Once | INTRAMUSCULAR | Status: AC
  Administered 2012-08-14: 60 mg via INTRAMUSCULAR

## 2012-08-14 NOTE — Patient Instructions (Addendum)
Ketorolac injection What is this medicine? KETOROLAC (kee toe ROLE ak) is a non-steroidal anti-inflammatory drug (NSAID). It is used to treat moderate to severe pain for up to 5 days. It is commonly used after surgery. This medicine should not be used for more than 5 days. This medicine may be used for other purposes; ask your health care provider or pharmacist if you have questions. What should I tell my health care provider before I take this medicine? They need to know if you have any of these conditions: -asthma, especially aspirin-sensitive asthma -bleeding problems -kidney disease -stomach bleed, ulcer, or other problem -taking aspirin, other NSAID, or probenecid -an unusual or allergic reaction to ketorolac, tromethamine, aspirin, other NSAIDs, other medicines, foods, dyes or preservatives -pregnant or trying to get pregnant -breast-feeding How should I use this medicine? This medicine is for injection into a muscle or into a vein. It is given by a health care professional in a hospital or clinic setting. Talk to your pediatrician regarding the use of this medicine in children. While this drug may be prescribed for children as young as 2 years old for selected conditions, precautions do apply. Patients over 65 years old may have a stronger reaction and need a smaller dose. Overdosage: If you think you have taken too much of this medicine contact a poison control center or emergency room at once. NOTE: This medicine is only for you. Do not share this medicine with others. What if I miss a dose? This does not apply. What may interact with this medicine? Do not take this medicine with any of the following medications: -aspirin and aspirin-like medicines -cidofovir -methotrexate -NSAIDs, medicines for pain and inflammation, like ibuprofen or naproxen -pentoxifylline -probenecid This medicine may also interact with the following  medications: -alcohol -alendronate -alprazolam -carbamazepine -diuretics -flavocoxid -fluoxetine -ginkgo -lithium -medicines for blood pressure like enalapril -medicines that affect platelets like pentoxifylline -medicines that treat or prevent blood clots like heparin, warfarin -muscle relaxants -pemetrexed -phenytoin -thiothixene This list may not describe all possible interactions. Give your health care provider a list of all the medicines, herbs, non-prescription drugs, or dietary supplements you use. Also tell them if you smoke, drink alcohol, or use illegal drugs. Some items may interact with your medicine. What should I watch for while using this medicine? Tell your doctor or healthcare professional if your symptoms do not start to get better or if they get worse. This medicine does not prevent heart attack or stroke. In fact, this medicine may increase the chance of a heart attack or stroke. The chance may increase with longer use of this medicine and in people who have heart disease. If you take aspirin to prevent heart attack or stroke, talk with your doctor or health care professional. Do not take medicines such as ibuprofen and naproxen with this medicine. Side effects such as stomach upset, nausea, or ulcers may be more likely to occur. Many medicines available without a prescription should not be taken with this medicine. This medicine can cause ulcers and bleeding in the stomach and intestines at any time during treatment. Do not smoke cigarettes or drink alcohol. These increase irritation to your stomach and can make it more susceptible to damage from this medicine. Ulcers and bleeding can happen without warning symptoms and can cause death. This medicine can cause you to bleed more easily. Try to avoid damage to your teeth and gums when you brush or floss your teeth. What side effects may I notice from receiving   this medicine? Side effects that you should report to your  doctor or health care professional as soon as possible: -allergic reactions like skin rash, itching or hives, swelling of the face, lips, or tongue -black or tarry stools -breathing problems -changes in vision -chest pain -high blood pressure -nausea, vomiting -redness, blistering, peeling or loosening of the skin, including inside the mouth -severe abdominal pain -slurred speech or weakness on one side of the body -trouble passing urine or change in the amount of urine -unexplained weight gain or swelling -unusual bleeding or bruising -unusually weak or tired -yellowing of eyes or skin Side effects that usually do not require medical attention (report to your doctor or health care professional if they continue or are bothersome): -diarrhea -dizziness -headache -heartburn This list may not describe all possible side effects. Call your doctor for medical advice about side effects. You may report side effects to FDA at 1-800-FDA-1088. Where should I keep my medicine? This drug is given in a hospital or clinic and will not be stored at home. NOTE: This sheet is a summary. It may not cover all possible information. If you have questions about this medicine, talk to your doctor, pharmacist, or health care provider.  2013, [employer]. (08/08/2007 5:24:50 PM)

## 2012-08-14 NOTE — Progress Notes (Signed)
Patient ID: [name], male   DOB: Dec 25, 1945, 67 y.o.   MRN: [id number] SUBJECTIVE: HPI: Has skin tags. Having spasm in the back and would like a shot of toradol. Has an appointment with DR [name] in 2 weeks to recheck on prostate. He is very afrtaid of Kidney cancer since there has been cases in his family. Patient is also here for follow up of hyperlipidemia/hypertension/gout/vit d deficiency/obesity/B12 deficiency/vit D deficiency: denies Headache;denies Chest Pain;denies weakness;admits to Shortness of Breath and orthopnea;denies Visual changes;denies palpitations;denies cough;denies pedal edema;denies symptoms of TIA or stroke;deniesClaudication symptoms. admits to Compliance with medications; denies Problems with medications.  He has an Aorto-iliac bypass from a past AAA. He also Has CKD, OSA and he continues to smoke.  The spironolactone  Was Rx in the past for pm but his  Sleep gets disrupted.  Also, the voltaren gel has helped his knees.   PMH/PSH: reviewed/updated in Epic  SH/FH: reviewed/updated in Epic  Allergies: reviewed/updated in Epic  Medications: reviewed/updated in Epic  Immunizations: reviewed/updated in Epic  ROS: As above in the HPI. All other systems are stable or negative.  OBJECTIVE: APPEARANCE:  Patient in no acute distress.The patient appeared well nourished and normally developed. Acyanotic. Waist: VITAL SIGNS:BP 138/90  Pulse 66  Temp(Src) 97 F (36.1 C) (Oral)  Wt 272 lb (123.378 kg)  BMI 42.59 kg/m2 Morbidly obese  SKIN: warm and  Dry without overt rashes, tattoos and scars. Multiple seborrheic keratosis and skin tags that the patient requests to be treated by Cryotherapy today.  HEAD and Neck: without JVD, Head and scalp: normal Eyes:No scleral icterus. Fundi normal, eye movements normal. Ears: Auricle normal, canal normal, Tympanic membranes normal, insufflation normal. Nose: normal Throat: normal Neck & thyroid:  normal  CHEST & LUNGS: Chest wall: normal Lungs: Clear  CVS: Reveals the PMI to be normally located. Regular rhythm, First and Second Heart sounds are normal,  absence of murmurs, rubs or gallops. Peripheral vasculature: Radial pulses: normal Dorsal pedis pulses: normal Posterior pulses: normal  ABDOMEN:  Appearance: morbidly obese. Benign,, no organomegaly, no masses, no Abdominal Aortic enlargement. No Guarding , no rebound. No Bruits. Bowel sounds: normal  RECTAL: N/A GU: N/A  EXTREMETIES: nonedematous. Both Femoral and Pedal pulses are normal.  MUSCULOSKELETAL:  Spine: normal. The right flank abdominal wall is tender to palpation. When he twists to the left it produces pain. Joints: intact  NEUROLOGIC: oriented to time,place and person; nonfocal. Strength is normal Sensory is normal Reflexes are normal Cranial Nerves are normal.  ASSESSMENT: Lumbar strain, initial encounter - Plan: TSH, cyclobenzaprine (FLEXERIL) 5 MG tablet  HTN (hypertension) - Plan: COMPLETE METABOLIC PANEL WITH GFR  Seborrheic keratosis  Cutaneous skin tags  Obesity, unspecified - Plan: POCT glycosylated hemoglobin (Hb A1C), COMPLETE METABOLIC PANEL WITH GFR, NMR Lipoprofile with Lipids  Tobacco use disorder - Plan: COMPLETE METABOLIC PANEL WITH GFR  Arthritis of both knees - Plan: COMPLETE METABOLIC PANEL WITH GFR, TSH, ketorolac (TORADOL) injection 60 mg  RLS (restless legs syndrome) - Plan: cyclobenzaprine (FLEXERIL) 5 MG tablet  OSA (obstructive sleep apnea)  Nephrolithiasis  Prediabetes   PLAN: Continue with the Voltaren Gel to the knees since it helps. Change the spironolactone to the am instead of at nights.   Cryotherapy Procedure Note  Pre-operative Diagnosis: seborrheic keratosis, actinic keratosis   Post-operative Diagnosis: same as above, total number treated 11.  Locations: anterior, posterior chest  Indications: irritation and premalignant  Anesthesia:  not required   Procedure Details  Patient informed of risks (permanent scarring, infection, light or dark discoloration, bleeding, infection, weakness, numbness and recurrence of the lesion) and benefits of the procedure and verbal informed consent obtained.  The areas are treated with liquid nitrogen therapy, frozen until ice ball extended <1 mm beyond lesion, allowed to thaw, and treated again. The patient tolerated procedure well.  The patient was instructed on post-op care, warned that there may be blister formation, redness and pain. Recommend OTC analgesia as needed for pain.  Condition: Stable  Complications: none.  Plan: 1. Instructed to keep the area dry and covered for 24-48h and clean thereafter. 2. Warning signs of infection were reviewed.   3. Recommended that the patient use OTC acetaminophen as needed for pain.  4. Return in prn or routine  Orders Placed This Encounter  Procedures  . COMPLETE METABOLIC PANEL WITH GFR  . NMR Lipoprofile with Lipids  . TSH  . POCT glycosylated hemoglobin (Hb A1C)   Meds ordered this encounter  Medications  . cyclobenzaprine (FLEXERIL) 5 MG tablet    Sig: Take 1 tablet (5 mg total) by mouth 3 (three) times daily as needed for muscle spasms.    Dispense:  30 tablet    Refill:  0  . ketorolac (TORADOL) injection 60 mg    Sig:     heat to the back stretches.  RTC 2 months Tylan Briguglio P. Jacelyn Grip, M.D.

## 2012-08-14 NOTE — Progress Notes (Signed)
Tolerated injection well. 

## 2012-08-19 NOTE — Progress Notes (Signed)
Needs labs. Not yet come in?

## 2012-08-20 ENCOUNTER — Other Ambulatory Visit: Payer: Self-pay | Admitting: Family Medicine

## 2012-08-27 DIAGNOSIS — C61 Malignant neoplasm of prostate: Secondary | ICD-10-CM | POA: Diagnosis not present

## 2012-08-30 ENCOUNTER — Ambulatory Visit (INDEPENDENT_AMBULATORY_CARE_PROVIDER_SITE_OTHER): Payer: Medicare Other | Admitting: Urology

## 2012-08-30 DIAGNOSIS — N138 Other obstructive and reflux uropathy: Secondary | ICD-10-CM

## 2012-08-30 DIAGNOSIS — C61 Malignant neoplasm of prostate: Secondary | ICD-10-CM | POA: Diagnosis not present

## 2012-08-30 DIAGNOSIS — C775 Secondary and unspecified malignant neoplasm of intrapelvic lymph nodes: Secondary | ICD-10-CM

## 2012-08-30 DIAGNOSIS — N200 Calculus of kidney: Secondary | ICD-10-CM

## 2012-08-30 DIAGNOSIS — R351 Nocturia: Secondary | ICD-10-CM | POA: Diagnosis not present

## 2012-08-30 DIAGNOSIS — N403 Nodular prostate with lower urinary tract symptoms: Secondary | ICD-10-CM

## 2012-09-09 ENCOUNTER — Telehealth: Payer: Self-pay | Admitting: Family Medicine

## 2012-09-09 NOTE — Telephone Encounter (Signed)
appt made

## 2012-09-10 ENCOUNTER — Encounter: Payer: Self-pay | Admitting: Family Medicine

## 2012-09-10 ENCOUNTER — Ambulatory Visit (INDEPENDENT_AMBULATORY_CARE_PROVIDER_SITE_OTHER): Payer: Medicare Other | Admitting: Family Medicine

## 2012-09-10 VITALS — BP 177/97 | HR 87 | Temp 96.9°F | Ht 67.5 in | Wt 273.0 lb

## 2012-09-10 DIAGNOSIS — M79671 Pain in right foot: Secondary | ICD-10-CM

## 2012-09-10 DIAGNOSIS — M79609 Pain in unspecified limb: Secondary | ICD-10-CM

## 2012-09-10 DIAGNOSIS — M109 Gout, unspecified: Secondary | ICD-10-CM

## 2012-09-10 MED ORDER — PREDNISONE 10 MG PO TABS: ORAL_TABLET | ORAL | Status: DC

## 2012-09-10 NOTE — Progress Notes (Signed)
Subjective:    Patient ID: [name], male    DOB: 01-Apr-1946, 67 y.o.   MRN: [id number]  HPI Patient has had bilateral foot pain for 2-3 days. Right is greater than left. He has a past history of gout and plantar fasciitis. He says this is different from the plantar fasciitis and feels more like gout to him. He has a multitude of problems including hypertension. He says he has whitecoat hypertension. His blood pressure is elevated today.  Review of Systems  Musculoskeletal: Positive for arthralgias (bilateral feet, started on Sunday).       Objective:   Physical Exam  Vitals reviewed. Constitutional: He is oriented to person, place, and time. He appears well-developed and well-nourished. No distress.  Somewhat overweight  HENT:  Head: Normocephalic.  Abdominal:  Obese  Musculoskeletal: He exhibits tenderness (especially the second third and fourth metatarsals of the right foot). He exhibits no edema.  Limited range of motion due to pain in feet  Neurological: He is alert and oriented to person, place, and time.  Skin: Skin is warm and dry. He is not diaphoretic.  Psychiatric: He has a normal mood and affect. His behavior is normal. Judgment and thought content normal.          Assessment & Plan:  1. Foot pain, bilateral - Uric acid - predniSONE (DELTASONE) 10 MG tablet; 1 tablet 4 times a day for 2 days,  1 tablet 3 times a day for 2 days,  1 tablet 2 times a day for 2 days, 1 tablet daily for 2 days  Dispense: 20 tablet; Refill: 0  2. Gout, unspecified - predniSONE (DELTASONE) 10 MG tablet; 1 tablet 4 times a day for 2 days,  1 tablet 3 times a day for 2 days,  1 tablet 2 times a day for 2 days, 1 tablet daily for 2 days  Dispense: 20 tablet; Refill: 0   Patient Instructions  Take meds as directed Elevate feet if possible When lab work is returned we will probably and colcrys to the treatment   Gout Gout is an inflammatory condition (arthritis) caused  by a buildup of uric acid crystals in the joints. Uric acid is a chemical that is normally present in the blood. Under some circumstances, uric acid can form into crystals in your joints. This causes joint redness, soreness, and swelling (inflammation). Repeat attacks are common. Over time, uric acid crystals can form into masses (tophi) near a joint, causing disfigurement. Gout is treatable and often preventable. CAUSES  The disease begins with elevated levels of uric acid in the blood. Uric acid is produced by your body when it breaks down a naturally found substance called purines. This also happens when you eat certain foods such as meats and fish. Causes of an elevated uric acid level include:  Being passed down from parent to child (heredity).  Diseases that cause increased uric acid production (obesity, psoriasis, some cancers).  Excessive alcohol use.  Diet, especially diets rich in meat and seafood.  Medicines, including certain cancer-fighting drugs (chemotherapy), diuretics, and aspirin.  Chronic kidney disease. The kidneys are no longer able to remove uric acid well.  Problems with metabolism. Conditions strongly associated with gout include:  Obesity.  High blood pressure.  High cholesterol.  Diabetes. Not everyone with elevated uric acid levels gets gout. It is not understood why some people get gout and others do not. Surgery, joint injury, and eating too much of certain foods are some of the  factors that can lead to gout. SYMPTOMS   An attack of gout comes on quickly. It causes intense pain with redness, swelling, and warmth in a joint.  Fever can occur.  Often, only one joint is involved. Certain joints are more commonly involved:  Base of the big toe.  Knee.  Ankle.  Wrist.  Finger. Without treatment, an attack usually goes away in a few days to weeks. Between attacks, you usually will not have symptoms, which is different from many other forms of  arthritis. DIAGNOSIS  Your caregiver will suspect gout based on your symptoms and exam. Removal of fluid from the joint (arthrocentesis) is done to check for uric acid crystals. Your caregiver will give you a medicine that numbs the area (local anesthetic) and use a needle to remove joint fluid for exam. Gout is confirmed when uric acid crystals are seen in joint fluid, using a special microscope. Sometimes, blood, urine, and X-ray tests are also used. TREATMENT  There are 2 phases to gout treatment: treating the sudden onset (acute) attack and preventing attacks (prophylaxis). Treatment of an Acute Attack  Medicines are used. These include anti-inflammatory medicines or steroid medicines.  An injection of steroid medicine into the affected joint is sometimes necessary.  The painful joint is rested. Movement can worsen the arthritis.  You may use warm or cold treatments on painful joints, depending which works best for you.  Discuss the use of coffee, vitamin C, or cherries with your caregiver. These may be helpful treatment options. Treatment to Prevent Attacks After the acute attack subsides, your caregiver may advise prophylactic medicine. These medicines either help your kidneys eliminate uric acid from your body or decrease your uric acid production. You may need to stay on these medicines for a very long time. The early phase of treatment with prophylactic medicine can be associated with an increase in acute gout attacks. For this reason, during the first few months of treatment, your caregiver may also advise you to take medicines usually used for acute gout treatment. Be sure you understand your caregiver's directions. You should also discuss dietary treatment with your caregiver. Certain foods such as meats and fish can increase uric acid levels. Other foods such as dairy can decrease levels. Your caregiver can give you a list of foods to avoid. HOME CARE INSTRUCTIONS   Do not take  aspirin to relieve pain. This raises uric acid levels.  Only take over-the-counter or prescription medicines for pain, discomfort, or fever as directed by your caregiver.  Rest the joint as much as possible. When in bed, keep sheets and blankets off painful areas.  Keep the affected joint raised (elevated).  Use crutches if the painful joint is in your leg.  Drink enough water and fluids to keep your urine clear or pale yellow. This helps your body get rid of uric acid. Do not drink alcoholic beverages. They slow the passage of uric acid.  Follow your caregiver's dietary instructions. Pay careful attention to the amount of protein you eat. Your daily diet should emphasize fruits, vegetables, whole grains, and fat-free or low-fat milk products.  Maintain a healthy body weight. SEEK MEDICAL CARE IF:   You have an oral temperature above 102 F (38.9 C).  You develop diarrhea, vomiting, or any side effects from medicines.  You do not feel better in 24 hours, or you are getting worse. SEEK IMMEDIATE MEDICAL CARE IF:   Your joint becomes suddenly more tender and you have:  Chills.  An oral temperature above 102 F (38.9 C), not controlled by medicine. MAKE SURE YOU:   Understand these instructions.  Will watch your condition.  Will get help right away if you are not doing well or get worse. Document Released: 03/17/2000 Document Revised: 06/12/2011 Document Reviewed: 06/28/2009 [facility] Patient Information 2014 [place].

## 2012-09-10 NOTE — Patient Instructions (Addendum)
Take meds as directed Elevate feet if possible When lab work is returned we will probably and colcrys to the treatment   Gout Gout is an inflammatory condition (arthritis) caused by a buildup of uric acid crystals in the joints. Uric acid is a chemical that is normally present in the blood. Under some circumstances, uric acid can form into crystals in your joints. This causes joint redness, soreness, and swelling (inflammation). Repeat attacks are common. Over time, uric acid crystals can form into masses (tophi) near a joint, causing disfigurement. Gout is treatable and often preventable. CAUSES  The disease begins with elevated levels of uric acid in the blood. Uric acid is produced by your body when it breaks down a naturally found substance called purines. This also happens when you eat certain foods such as meats and fish. Causes of an elevated uric acid level include:  Being passed down from parent to child (heredity).  Diseases that cause increased uric acid production (obesity, psoriasis, some cancers).  Excessive alcohol use.  Diet, especially diets rich in meat and seafood.  Medicines, including certain cancer-fighting drugs (chemotherapy), diuretics, and aspirin.  Chronic kidney disease. The kidneys are no longer able to remove uric acid well.  Problems with metabolism. Conditions strongly associated with gout include:  Obesity.  High blood pressure.  High cholesterol.  Diabetes. Not everyone with elevated uric acid levels gets gout. It is not understood why some people get gout and others do not. Surgery, joint injury, and eating too much of certain foods are some of the factors that can lead to gout. SYMPTOMS   An attack of gout comes on quickly. It causes intense pain with redness, swelling, and warmth in a joint.  Fever can occur.  Often, only one joint is involved. Certain joints are more commonly involved:  Base of the big  toe.  Knee.  Ankle.  Wrist.  Finger. Without treatment, an attack usually goes away in a few days to weeks. Between attacks, you usually will not have symptoms, which is different from many other forms of arthritis. DIAGNOSIS  Your caregiver will suspect gout based on your symptoms and exam. Removal of fluid from the joint (arthrocentesis) is done to check for uric acid crystals. Your caregiver will give you a medicine that numbs the area (local anesthetic) and use a needle to remove joint fluid for exam. Gout is confirmed when uric acid crystals are seen in joint fluid, using a special microscope. Sometimes, blood, urine, and X-ray tests are also used. TREATMENT  There are 2 phases to gout treatment: treating the sudden onset (acute) attack and preventing attacks (prophylaxis). Treatment of an Acute Attack  Medicines are used. These include anti-inflammatory medicines or steroid medicines.  An injection of steroid medicine into the affected joint is sometimes necessary.  The painful joint is rested. Movement can worsen the arthritis.  You may use warm or cold treatments on painful joints, depending which works best for you.  Discuss the use of coffee, vitamin C, or cherries with your caregiver. These may be helpful treatment options. Treatment to Prevent Attacks After the acute attack subsides, your caregiver may advise prophylactic medicine. These medicines either help your kidneys eliminate uric acid from your body or decrease your uric acid production. You may need to stay on these medicines for a very long time. The early phase of treatment with prophylactic medicine can be associated with an increase in acute gout attacks. For this reason, during the first few months of  treatment, your caregiver may also advise you to take medicines usually used for acute gout treatment. Be sure you understand your caregiver's directions. You should also discuss dietary treatment with your  caregiver. Certain foods such as meats and fish can increase uric acid levels. Other foods such as dairy can decrease levels. Your caregiver can give you a list of foods to avoid. HOME CARE INSTRUCTIONS   Do not take aspirin to relieve pain. This raises uric acid levels.  Only take over-the-counter or prescription medicines for pain, discomfort, or fever as directed by your caregiver.  Rest the joint as much as possible. When in bed, keep sheets and blankets off painful areas.  Keep the affected joint raised (elevated).  Use crutches if the painful joint is in your leg.  Drink enough water and fluids to keep your urine clear or pale yellow. This helps your body get rid of uric acid. Do not drink alcoholic beverages. They slow the passage of uric acid.  Follow your caregiver's dietary instructions. Pay careful attention to the amount of protein you eat. Your daily diet should emphasize fruits, vegetables, whole grains, and fat-free or low-fat milk products.  Maintain a healthy body weight. SEEK MEDICAL CARE IF:   You have an oral temperature above 102 F (38.9 C).  You develop diarrhea, vomiting, or any side effects from medicines.  You do not feel better in 24 hours, or you are getting worse. SEEK IMMEDIATE MEDICAL CARE IF:   Your joint becomes suddenly more tender and you have:  Chills.  An oral temperature above 102 F (38.9 C), not controlled by medicine. MAKE SURE YOU:   Understand these instructions.  Will watch your condition.  Will get help right away if you are not doing well or get worse. Document Released: 03/17/2000 Document Revised: 06/12/2011 Document Reviewed: 06/28/2009 [facility] Patient Information 2014 [place].

## 2012-09-11 ENCOUNTER — Telehealth: Payer: Self-pay | Admitting: *Deleted

## 2012-09-11 LAB — URIC ACID: Uric Acid, Serum: 9.2 mg/dL — ABNORMAL HIGH (ref 4.0–6.0)

## 2012-09-11 NOTE — Telephone Encounter (Signed)
Pt notified of results Rx called into [facility] in [place] for Colcrys

## 2012-09-11 NOTE — Telephone Encounter (Signed)
Message copied by Marin Olp on Wed Sep 11, 2012  6:31 PM ------      Message from: Chipper Herb      Created: Wed Sep 11, 2012  7:50 AM       The uric acid was definitely elevated at 9.2, it should be less than 6.0      Call in colcrys 0.6 mg, take 2 initially, then 1 by mouth 1 hour later x1. No more than 3 per day. After the first day just take 0.6 mg or one pill daily, prescribed 30 with 2 refill      Should recheck patient and uric acid in one month, see if he has an appointment with Dr. Jacelyn Grip any time soon ------

## 2012-09-20 ENCOUNTER — Other Ambulatory Visit (HOSPITAL_COMMUNITY): Payer: Self-pay | Admitting: Family Medicine

## 2012-10-07 ENCOUNTER — Other Ambulatory Visit: Payer: Self-pay

## 2012-10-07 MED ORDER — PRAVASTATIN SODIUM 40 MG PO TABS: 40.0000 mg | ORAL_TABLET | Freq: Every evening | ORAL | Status: DC

## 2012-10-15 ENCOUNTER — Ambulatory Visit (INDEPENDENT_AMBULATORY_CARE_PROVIDER_SITE_OTHER): Payer: Medicare Other | Admitting: Family Medicine

## 2012-10-15 ENCOUNTER — Encounter: Payer: Self-pay | Admitting: Family Medicine

## 2012-10-15 VITALS — BP 169/97 | HR 63 | Temp 97.0°F | Ht 68.0 in | Wt 274.4 lb

## 2012-10-15 DIAGNOSIS — E538 Deficiency of other specified B group vitamins: Secondary | ICD-10-CM

## 2012-10-15 DIAGNOSIS — R7309 Other abnormal glucose: Secondary | ICD-10-CM

## 2012-10-15 DIAGNOSIS — R209 Unspecified disturbances of skin sensation: Secondary | ICD-10-CM | POA: Diagnosis not present

## 2012-10-15 DIAGNOSIS — I714 Abdominal aortic aneurysm, without rupture, unspecified: Secondary | ICD-10-CM

## 2012-10-15 DIAGNOSIS — E559 Vitamin D deficiency, unspecified: Secondary | ICD-10-CM

## 2012-10-15 DIAGNOSIS — I10 Essential (primary) hypertension: Secondary | ICD-10-CM

## 2012-10-15 DIAGNOSIS — E785 Hyperlipidemia, unspecified: Secondary | ICD-10-CM

## 2012-10-15 DIAGNOSIS — G2581 Restless legs syndrome: Secondary | ICD-10-CM

## 2012-10-15 DIAGNOSIS — K279 Peptic ulcer, site unspecified, unspecified as acute or chronic, without hemorrhage or perforation: Secondary | ICD-10-CM | POA: Diagnosis not present

## 2012-10-15 DIAGNOSIS — R809 Proteinuria, unspecified: Secondary | ICD-10-CM

## 2012-10-15 DIAGNOSIS — R7303 Prediabetes: Secondary | ICD-10-CM

## 2012-10-15 DIAGNOSIS — F17200 Nicotine dependence, unspecified, uncomplicated: Secondary | ICD-10-CM | POA: Diagnosis not present

## 2012-10-15 DIAGNOSIS — K219 Gastro-esophageal reflux disease without esophagitis: Secondary | ICD-10-CM

## 2012-10-15 DIAGNOSIS — N182 Chronic kidney disease, stage 2 (mild): Secondary | ICD-10-CM

## 2012-10-15 DIAGNOSIS — R202 Paresthesia of skin: Secondary | ICD-10-CM

## 2012-10-15 DIAGNOSIS — N200 Calculus of kidney: Secondary | ICD-10-CM

## 2012-10-15 DIAGNOSIS — E669 Obesity, unspecified: Secondary | ICD-10-CM

## 2012-10-15 DIAGNOSIS — M109 Gout, unspecified: Secondary | ICD-10-CM | POA: Diagnosis not present

## 2012-10-15 DIAGNOSIS — G4733 Obstructive sleep apnea (adult) (pediatric): Secondary | ICD-10-CM

## 2012-10-15 LAB — POCT CBC
Granulocyte percent: 79.1 %G (ref 37–80)
HCT, POC: 41 % — AB (ref 43.5–53.7)
Hemoglobin: 14.4 g/dL (ref 14.1–18.1)
Lymph, poc: 1.6 (ref 0.6–3.4)
MCH, POC: 30.7 pg (ref 27–31.2)
MCHC: 35.2 g/dL (ref 31.8–35.4)
MCV: 87.1 fL (ref 80–97)
MPV: 7.8 fL (ref 0–99.8)
POC Granulocyte: 7.3 — AB (ref 2–6.9)
POC LYMPH PERCENT: 17.1 %L (ref 10–50)
Platelet Count, POC: 214 10*3/uL (ref 142–424)
RBC: 4.7 M/uL (ref 4.69–6.13)
RDW, POC: 14.3 %
WBC: 9.2 10*3/uL (ref 4.6–10.2)

## 2012-10-15 LAB — POCT GLYCOSYLATED HEMOGLOBIN (HGB A1C): Hemoglobin A1C: 6.1

## 2012-10-15 MED ORDER — AMLODIPINE BESYLATE 10 MG PO TABS: ORAL_TABLET | ORAL | Status: DC

## 2012-10-15 NOTE — Progress Notes (Signed)
Patient ID: [name], male   DOB: 1945-06-21, 67 y.o.   MRN: [id number] SUBJECTIVE: CC: Chief Complaint  Patient presents with  . Follow-up    2 month ck up for chronic problems  LEG CRAMPS REFER TO CARDIOLOGIST AT [place]    HPI: Feet numb all the time.wakes at nights with numbness.and the legs feel dead.has had a AAA stented and repaired by DR Kellie Simmering. Wants to see a Cardiologist, but because it has been more than 3 years they required a referral. Not having any chest pain but he has a strong family history, deconditioned and significant risk factors that he felt it was time to get re-evaulated  No other new complaints.  Past Medical History  Diagnosis Date  . Coronary artery disease   . Gout   . Shortness of breath     with exertion  . Chronic kidney disease     stone,   elevated PSA  . GERD (gastroesophageal reflux disease)   . Hypertension     medical clearance  Dr Dennard Schaumann on chart  . COPD (chronic obstructive pulmonary disease)     "collapsed left lung" years ago also  . Sleep apnea     no CPAP- "cant tolerate"  . Anginal pain 6/11    cath and stress test  6/11 on chart- denies any pain since  . Peripheral vascular disease     artherosclerotic ulcer infrarenal aorta--s/p endograft 2004-  followed by yearly scan- last 10/12 [facility]  . Cancer   . AAA (abdominal aortic aneurysm)    Past Surgical History  Procedure Laterality Date  . Vascular surgery  2004    endograft - states METAL STENT  . Cardiac catheterization  2011  . Colonoscopy w/ polypectomy    . Hernia repair      umbilical  . Fracture surgery      bilateral lower arms as a child  . Knee arthroscopy      left  . Prostate biopsy  09/26/2011    Procedure: BIOPSY TRANSRECTAL ULTRASONIC PROSTATE (TUBP);  Surgeon: [name], MD;  Location: [facility];  Service: Urology;  Laterality: N/A;      . Circumcision  09/26/2011    Procedure: CIRCUMCISION ADULT;  Surgeon: [name], MD;  Location: [facility];   Service: Urology;  Laterality: N/A;  . Cystoscopy  09/26/2011    Procedure: CYSTOSCOPY;  Surgeon: [name], MD;  Location: [facility];  Service: Urology;  Laterality: N/A;  . Knee surgery  Sept. 26, 2013    Torn minisc.- Right  knee   History   Social History  . Marital Status: Married    Spouse Name: N/A    Number of Children: N/A  . Years of Education: N/A   Occupational History  . Not on file.   Social History Main Topics  . Smoking status: Current Every Day Smoker -- 1.00 packs/day for 24 years  . Smokeless tobacco: Never Used  . Alcohol Use: No  . Drug Use: No  . Sexually Active: Not on file   Other Topics Concern  . Not on file   Social History Narrative  . No narrative on file   Family History  Problem Relation Age of Onset  . Stroke Mother   . Mesothelioma Father    Current Outpatient Prescriptions on File Prior to Visit  Medication Sig Dispense Refill  . carvedilol (COREG) 25 MG tablet Take 25 mg by mouth 2 (two) times daily.       [name]  losartan (COZAAR) 50 MG tablet TAKE ONE TABLET BY MOUTH ONCE DAILY  30 tablet  3  . pantoprazole (PROTONIX) 40 MG tablet Take 40 mg by mouth every evening.       . pravastatin (PRAVACHOL) 40 MG tablet Take 1 tablet (40 mg total) by mouth every evening.  30 tablet  4  . spironolactone (ALDACTONE) 25 MG tablet TAKE ONE-HALF TABLET BY MOUTH EVERY DAY  15 tablet  2  . VOLTAREN 1 % GEL       . predniSONE (DELTASONE) 10 MG tablet 1 tablet 4 times a day for 2 days,  1 tablet 3 times a day for 2 days,  1 tablet 2 times a day for 2 days, 1 tablet daily for 2 days  20 tablet  0   No current facility-administered medications on file prior to visit.   Allergies  Allergen Reactions  . Morphine Sulfate Nausea And Vomiting  . Sulfacetamide Sodium Nausea And Vomiting  . Sulfamethoxazole W-Trimethoprim Nausea And Vomiting   Immunization History  Administered Date(s) Administered  . Influenza Whole 01/31/2007, 01/20/2008   Prior to  Admission medications   Medication Sig Start Date End Date Taking? Authorizing Provider  amLODipine (NORVASC) 10 MG tablet TAKE ONE TABLET BY MOUTH ONCE DAILY 10/15/12  Yes Vernie Shanks, MD  carvedilol (COREG) 25 MG tablet Take 25 mg by mouth 2 (two) times daily.    Yes Historical Provider, MD  cloNIDine (CATAPRES) 0.1 MG tablet Take 2 tablets (0.2 mg total) by mouth 2 (two) times daily. 10/15/12  Yes Vernie Shanks, MD  losartan (COZAAR) 50 MG tablet TAKE ONE TABLET BY MOUTH ONCE DAILY 08/20/12  Yes Vernie Shanks, MD  pantoprazole (PROTONIX) 40 MG tablet Take 40 mg by mouth every evening.    Yes Historical Provider, MD  pravastatin (PRAVACHOL) 40 MG tablet Take 1 tablet (40 mg total) by mouth every evening. 10/07/12  Yes Chipper Herb, MD  spironolactone (ALDACTONE) 25 MG tablet TAKE ONE-HALF TABLET BY MOUTH EVERY DAY 09/20/12  Yes Susy Frizzle, MD  VOLTAREN 1 % GEL  06/14/12  Yes Historical Provider, MD  COLCRYS 0.6 MG tablet  09/11/12   Historical Provider, MD  predniSONE (DELTASONE) 10 MG tablet 1 tablet 4 times a day for 2 days,  1 tablet 3 times a day for 2 days,  1 tablet 2 times a day for 2 days, 1 tablet daily for 2 days 09/10/12   Chipper Herb, MD    ROS: As above in the HPI. All other systems are stable or negative.  OBJECTIVE: APPEARANCE:  Patient in no acute distress.The patient appeared well nourished and normally developed. Acyanotic. Waist: VITAL SIGNS:BP 169/97  Pulse 63  Temp(Src) 97 F (36.1 C) (Oral)  Ht 5\' 8"  (1.727 m)  Wt 274 lb 6.4 oz (124.467 kg)  BMI 41.73 kg/m2 Morbidly obese  SKIN: warm and  Dry without overt rashes, tattoos and scars  HEAD and Neck: without JVD, Head and scalp: normal Eyes:No scleral icterus. Fundi normal, eye movements normal. Ears: Auricle normal, canal normal, Tympanic membranes normal, insufflation normal. Nose: normal Throat: normal Neck & thyroid: normal  CHEST & LUNGS: Chest wall: normal Lungs: Clear  CVS: Reveals  the PMI to be normally located. Regular rhythm, First and Second Heart sounds are normal,  absence of murmurs, rubs or gallops. Peripheral vasculature: Radial pulses: normal Dorsal pedis pulses: normal Posterior pulses: normal  ABDOMEN:  Appearance: obese Benign, no organomegaly, no masses, no  Abdominal Aortic enlargement. No Guarding , no rebound. No Bruits. Bowel sounds: normal  RECTAL: N/A GU: N/A  EXTREMETIES: nonedematous.  MUSCULOSKELETAL:  Spine: reduced ROM with pain  NEUROLOGIC: oriented to time,place and person; nonfocal. Strength is normal Sensory is abnormal subjective numbness in a stocking glove manner. Reflexes are normal Cranial Nerves are normal.  ASSESSMENT: VITAMIN D DEFICIENCY  VITAMIN B12 DEFICIENCY  TOBACCO ABUSE - Plan: Ambulatory referral to Cardiology  PUD  RLS (restless legs syndrome)  Proteinuria  Prediabetes - Plan: Ambulatory referral to Cardiology, POCT glycosylated hemoglobin (Hb A1C)  OSA (obstructive sleep apnea)  OBESITY NOS  Nephrolithiasis  HYPERTENSION - Plan: amLODipine (NORVASC) 10 MG tablet, COMPLETE METABOLIC PANEL WITH GFR, Ambulatory referral to Cardiology  HYPERLIPIDEMIA - Plan: COMPLETE METABOLIC PANEL WITH GFR, NMR Lipoprofile with Lipids, Ambulatory referral to Cardiology  GOUT - Plan: COMPLETE METABOLIC PANEL WITH GFR, Uric acid  GERD  CHRONIC KIDNEY DISEASE STAGE II (MILD)  Paresthesia of bilateral legs - Plan: TSH, Vitamin B12, Vitamin D 25 hydroxy, POCT CBC  AAA (abdominal aortic aneurysm) without rupture - Plan: Ambulatory referral to Cardiology   PLAN:  Orders Placed This Encounter  Procedures  . COMPLETE METABOLIC PANEL WITH GFR  . NMR Lipoprofile with Lipids  . TSH  . Vitamin B12  . Vitamin D 25 hydroxy  . Uric acid  . Ambulatory referral to Cardiology    Referral Priority:  Routine    Referral Type:  Consultation    Referral Reason:  Specialty Services Required    Requested  Specialty:  Cardiology    Number of Visits Requested:  1  . POCT CBC  . POCT glycosylated hemoglobin (Hb A1C)   Meds ordered this encounter  Medications  . COLCRYS 0.6 MG tablet    Sig:   . amLODipine (NORVASC) 10 MG tablet    Sig: TAKE ONE TABLET BY MOUTH ONCE DAILY    Dispense:  30 tablet    Refill:  5  . cloNIDine (CATAPRES) 0.1 MG tablet    Sig: Take 2 tablets (0.2 mg total) by mouth 2 (two) times daily.    Dispense:  60 tablet   Results for orders placed in visit on 09/10/12  URIC ACID      Result Value Range   Uric Acid, Serum 9.2 (*) 4.0 - 6.0 mg/dL   meds and labs reviewed with patient.  Diet exercise and lifestyle changes emphasized.  Return in about 2 weeks (around 10/29/2012) for recheck BP.  Orpheus [place] P. Jacelyn Grip, M.D.

## 2012-10-16 LAB — NMR LIPOPROFILE WITH LIPIDS
Cholesterol, Total: 188 mg/dL (ref <200)
HDL Particle Number: 31.5 umol/L (ref >=30.5)
HDL Size: 8.4 nm — ABNORMAL LOW (ref >=9.2)
HDL-C: 36 mg/dL — ABNORMAL LOW (ref >=40)
LDL (calc): 112 mg/dL — ABNORMAL HIGH (ref <100)
LDL Particle Number: 1824 nmol/L — ABNORMAL HIGH (ref <1000)
LDL Size: 20.5 nm — ABNORMAL LOW (ref >20.5)
LP-IR Score: 100 — ABNORMAL HIGH (ref <=45)
Large HDL-P: <1.3 umol/L — ABNORMAL LOW (ref >=4.8)
Large VLDL-P: 15.7 nmol/L — ABNORMAL HIGH (ref <=2.7)
Small LDL Particle Number: 839 nmol/L — ABNORMAL HIGH (ref <=527)
Triglycerides: 201 mg/dL — ABNORMAL HIGH (ref <150)
VLDL Size: 65.3 nm — ABNORMAL HIGH (ref <=46.6)

## 2012-10-16 LAB — COMPLETE METABOLIC PANEL WITH GFR
ALT: 20 U/L (ref 0–53)
AST: 17 U/L (ref 0–37)
Albumin: 4.2 g/dL (ref 3.5–5.2)
Alkaline Phosphatase: 63 U/L (ref 39–117)
BUN: 17 mg/dL (ref 6–23)
CO2: 25 mEq/L (ref 19–32)
Calcium: 8.6 mg/dL (ref 8.4–10.5)
Chloride: 105 mEq/L (ref 96–112)
Creat: 1.71 mg/dL — ABNORMAL HIGH (ref 0.50–1.35)
GFR, Est African American: 47 mL/min — ABNORMAL LOW
GFR, Est Non African American: 41 mL/min — ABNORMAL LOW
Glucose, Bld: 82 mg/dL (ref 70–99)
Potassium: 3.7 mEq/L (ref 3.5–5.3)
Sodium: 143 mEq/L (ref 135–145)
Total Bilirubin: 0.6 mg/dL (ref 0.3–1.2)
Total Protein: 6.6 g/dL (ref 6.0–8.3)

## 2012-10-16 LAB — VITAMIN D 25 HYDROXY (VIT D DEFICIENCY, FRACTURES): Vit D, 25-Hydroxy: 28 ng/mL — ABNORMAL LOW (ref 30–89)

## 2012-10-16 LAB — VITAMIN B12: Vitamin B-12: 250 pg/mL (ref 211–911)

## 2012-10-16 LAB — URIC ACID: Uric Acid, Serum: 8.6 mg/dL — ABNORMAL HIGH (ref 4.0–6.0)

## 2012-10-16 LAB — TSH: TSH: 1.953 u[IU]/mL (ref 0.350–4.500)

## 2012-10-18 ENCOUNTER — Other Ambulatory Visit: Payer: Self-pay | Admitting: Family Medicine

## 2012-10-18 DIAGNOSIS — E785 Hyperlipidemia, unspecified: Secondary | ICD-10-CM

## 2012-10-18 DIAGNOSIS — M109 Gout, unspecified: Secondary | ICD-10-CM

## 2012-10-18 MED ORDER — ALLOPURINOL 300 MG PO TABS: 300.0000 mg | ORAL_TABLET | Freq: Every day | ORAL | Status: DC

## 2012-10-18 MED ORDER — PRAVASTATIN SODIUM 80 MG PO TABS: 80.0000 mg | ORAL_TABLET | Freq: Every evening | ORAL | Status: DC

## 2012-11-11 ENCOUNTER — Ambulatory Visit: Payer: Medicare Other | Admitting: Cardiovascular Disease

## 2012-11-18 ENCOUNTER — Ambulatory Visit: Payer: Medicare Other | Admitting: Cardiology

## 2012-11-18 ENCOUNTER — Encounter: Payer: Self-pay | Admitting: Cardiology

## 2012-11-25 DIAGNOSIS — C61 Malignant neoplasm of prostate: Secondary | ICD-10-CM | POA: Diagnosis not present

## 2012-11-29 ENCOUNTER — Ambulatory Visit (INDEPENDENT_AMBULATORY_CARE_PROVIDER_SITE_OTHER): Payer: Medicare Other | Admitting: Urology

## 2012-11-29 DIAGNOSIS — C61 Malignant neoplasm of prostate: Secondary | ICD-10-CM

## 2012-11-29 DIAGNOSIS — N138 Other obstructive and reflux uropathy: Secondary | ICD-10-CM | POA: Diagnosis not present

## 2012-12-09 ENCOUNTER — Encounter: Payer: Medicare Other | Admitting: Cardiology

## 2012-12-09 ENCOUNTER — Encounter: Payer: Self-pay | Admitting: Cardiology

## 2012-12-09 NOTE — Progress Notes (Signed)
No show  This encounter was created in error - please disregard.

## 2012-12-26 ENCOUNTER — Ambulatory Visit (INDEPENDENT_AMBULATORY_CARE_PROVIDER_SITE_OTHER): Payer: Medicare Other | Admitting: Family Medicine

## 2012-12-26 ENCOUNTER — Encounter: Payer: Self-pay | Admitting: Family Medicine

## 2012-12-26 VITALS — BP 149/98 | HR 82 | Temp 97.4°F | Ht 68.0 in | Wt 272.0 lb

## 2012-12-26 DIAGNOSIS — R252 Cramp and spasm: Secondary | ICD-10-CM

## 2012-12-26 DIAGNOSIS — J209 Acute bronchitis, unspecified: Secondary | ICD-10-CM | POA: Diagnosis not present

## 2012-12-26 MED ORDER — METHYLPREDNISOLONE (PAK) 4 MG PO TABS: ORAL_TABLET | ORAL | Status: DC

## 2012-12-26 MED ORDER — BENZONATATE 100 MG PO CAPS: 200.0000 mg | ORAL_CAPSULE | Freq: Three times a day (TID) | ORAL | Status: DC | PRN

## 2012-12-26 MED ORDER — AMOXICILLIN 875 MG PO TABS: 875.0000 mg | ORAL_TABLET | Freq: Two times a day (BID) | ORAL | Status: DC

## 2012-12-26 MED ORDER — POTASSIUM CHLORIDE CRYS ER 20 MEQ PO TBCR: 20.0000 meq | EXTENDED_RELEASE_TABLET | Freq: Two times a day (BID) | ORAL | Status: DC

## 2012-12-26 NOTE — Progress Notes (Signed)
  Subjective:    Patient ID: [name], male    DOB: March 06, 1946, 67 y.o.   MRN: [id number]  HPI This 67 y.o. male presents for evaluation of leg cramps.  He is also c/o uri sx's lasting for over  A week.   Review of Systems No chest pain, SOB, HA, dizziness, vision change, N/V, diarrhea, constipation, dysuria, urinary urgency or frequency, myalgias, arthralgias or rash.     Objective:   Physical Exam  Vital signs noted  Well developed well nourished male.  HEENT - Head atraumatic Normocephalic                Eyes - PERRLA, Conjuctiva - clear Sclera- Clear EOMI                Ears - EAC's Wnl TM's Wnl Gross Hearing WNL                Nose - Nares patent                 Throat - oropharanx wnl Respiratory - Lungs CTA bilateral Cardiac - RRR S1 and S2 without murmur GI - Abdomen soft Nontender and bowel sounds active x 4 Extremities - No edema. Neuro - Grossly intact.      Assessment & Plan:    Cramps, extremity - Plan: potassium chloride SA (K-DUR,KLOR-CON) 20 MEQ tablet, BMP8+EGFR  Acute bronchitis - Plan: amoxicillin (AMOXIL) 875 MG tablet, methylPREDNIsolone (MEDROL DOSPACK) 4 MG tablet, benzonatate (TESSALON PERLES) 100 MG capsule, BMP8+EGFR  [name] FNP

## 2012-12-27 LAB — BMP8+EGFR
BUN/Creatinine Ratio: 9 — ABNORMAL LOW (ref 10–22)
BUN: 16 mg/dL (ref 8–27)
CO2: 24 mmol/L (ref 18–29)
Calcium: 9.3 mg/dL (ref 8.6–10.2)
Chloride: 103 mmol/L (ref 97–108)
Creatinine, Ser: 1.73 mg/dL — ABNORMAL HIGH (ref 0.76–1.27)
GFR calc Af Amer: 46 mL/min/{1.73_m2} — ABNORMAL LOW (ref >59)
GFR calc non Af Amer: 40 mL/min/{1.73_m2} — ABNORMAL LOW (ref >59)
Glucose: 68 mg/dL (ref 65–99)
Potassium: 4 mmol/L (ref 3.5–5.2)
Sodium: 144 mmol/L (ref 134–144)

## 2012-12-27 NOTE — Patient Instructions (Signed)

## 2012-12-31 ENCOUNTER — Encounter: Payer: Self-pay | Admitting: Cardiology

## 2012-12-31 ENCOUNTER — Ambulatory Visit (INDEPENDENT_AMBULATORY_CARE_PROVIDER_SITE_OTHER): Payer: Medicare Other | Admitting: Cardiology

## 2012-12-31 VITALS — BP 158/97 | HR 70 | Ht 68.0 in | Wt 277.0 lb

## 2012-12-31 DIAGNOSIS — E785 Hyperlipidemia, unspecified: Secondary | ICD-10-CM | POA: Diagnosis not present

## 2012-12-31 DIAGNOSIS — I10 Essential (primary) hypertension: Secondary | ICD-10-CM

## 2012-12-31 DIAGNOSIS — I2510 Atherosclerotic heart disease of native coronary artery without angina pectoris: Secondary | ICD-10-CM

## 2012-12-31 DIAGNOSIS — R0602 Shortness of breath: Secondary | ICD-10-CM

## 2012-12-31 DIAGNOSIS — I700 Atherosclerosis of aorta: Secondary | ICD-10-CM

## 2012-12-31 NOTE — Progress Notes (Signed)
Clinical Summary [name] is a 67 y.o.male presenting for an office visit. He was last seen by Dr. Aundra Dubin in December 2011. Cardiac catheterization in June 2011 demonstrated minimal, nonobstructive CAD. He states that he has not experienced any new symptomatology, specifically no regular chest pain symptoms, chronic stable dyspnea on exertion. He has OSA, states that he is intolerant of CPAP. He reports that he may be in need of right knee surgery sometime before the end of the year due to progressive pain and meniscal disease.  ECG today shows sinus rhythm with LAFB, LVH and nonspecific ST-T changes, probable repolarization abnormalities. No significant changes.  Recent lipid panel in July revealed LDL 112, HDL 36, triglycerides 201, cholesterol 188. Additional lab work revealed BUN 16, creatinine 1.7, potassium 4.0, AST and ALT normal.  Allergies  Allergen Reactions  . Morphine Sulfate Nausea And Vomiting  . Sulfacetamide Sodium Nausea And Vomiting  . Sulfamethoxazole-Trimethoprim Nausea And Vomiting    Current Outpatient Prescriptions  Medication Sig Dispense Refill  . allopurinol (ZYLOPRIM) 300 MG tablet Take 1 tablet (300 mg total) by mouth daily.  30 tablet  6  . amLODipine (NORVASC) 10 MG tablet TAKE ONE TABLET BY MOUTH ONCE DAILY  30 tablet  5  . amoxicillin (AMOXIL) 875 MG tablet Take 1 tablet (875 mg total) by mouth 2 (two) times daily.  20 tablet  0  . benzonatate (TESSALON PERLES) 100 MG capsule Take 2 capsules (200 mg total) by mouth 3 (three) times daily as needed for cough.  20 capsule  3  . carvedilol (COREG) 25 MG tablet Take 25 mg by mouth 2 (two) times daily.       . cloNIDine (CATAPRES) 0.1 MG tablet Take 2 tablets (0.2 mg total) by mouth 2 (two) times daily.  60 tablet    . losartan (COZAAR) 50 MG tablet TAKE ONE TABLET BY MOUTH ONCE DAILY  30 tablet  3  . meloxicam (MOBIC) 15 MG tablet       . methylPREDNIsolone (MEDROL DOSPACK) 4 MG tablet follow package  directions  21 tablet  0  . pantoprazole (PROTONIX) 40 MG tablet Take 40 mg by mouth 2 (two) times daily.       . potassium chloride SA (K-DUR,KLOR-CON) 20 MEQ tablet Take 1 tablet (20 mEq total) by mouth 2 (two) times daily.  60 tablet  3  . pravastatin (PRAVACHOL) 80 MG tablet Take 1 tablet (80 mg total) by mouth every evening.  30 tablet  6  . spironolactone (ALDACTONE) 25 MG tablet TAKE ONE-HALF TABLET BY MOUTH EVERY DAY  15 tablet  2  . VOLTAREN 1 % GEL        No current facility-administered medications for this visit.    Past Medical History  Diagnosis Date  . Coronary atherosclerosis of native coronary artery     Mild atherosclerosis at catheterization 2011  . Gout   . Nephrolithiasis   . GERD (gastroesophageal reflux disease)   . Essential hypertension, benign   . COPD (chronic obstructive pulmonary disease)     "collapsed left lung" years ago also  . Sleep apnea     Intolerant of CPAP  . Aortic atherosclerosis     Penetrating aortic ulcer s/p endograft 2004 - followed by Dr. Trula Slade  . Prostate cancer     Past Surgical History  Procedure Laterality Date  . Aortic endograft repair  2004    Dr. [name]  . Colonoscopy w/ polypectomy    . Umbilical  hernia repair    . Fracture surgery      Bilateral lower arms as a child  . Knee arthroscopy      Left  . Prostate biopsy  09/26/2011    Procedure: BIOPSY TRANSRECTAL ULTRASONIC PROSTATE (TUBP);  Surgeon: [name], MD;  Location: [facility];  Service: Urology;  Laterality: N/A;     . Circumcision  09/26/2011    Procedure: CIRCUMCISION ADULT;  Surgeon: [name], MD;  Location: [facility];  Service: Urology;  Laterality: N/A;  . Cystoscopy  09/26/2011    Procedure: CYSTOSCOPY;  Surgeon: [name], MD;  Location: [facility];  Service: Urology;  Laterality: N/A;  . Knee surgery  Sept. 26, 2013    Torn minisc.- Right  knee    Family History  Problem Relation Age of Onset  . Stroke Mother   . Mesothelioma Father     Social  History Mr. Kuznia reports that he has been smoking Cigarettes.  He has a 21 pack-year smoking history. He has never used smokeless tobacco. Mr. Koss reports that he does not drink alcohol.  Review of Systems Reports chronic knee pain, leg cramps at nighttime. Mild swelling in his ankles, worse in his knees. No orthopnea or PND. No regular angina symptoms. NYHA class II dyspnea. Otherwise negative.  Physical Examination Filed Vitals:   12/31/12 1307  BP: 158/97  Pulse: 70   Filed Weights   12/31/12 1307  Weight: 277 lb (125.646 kg)   Obese male, appears comfortable at rest. HEENT: Conjunctiva and lids normal, oropharynx clear. Neck: Supple, no elevated JVP or carotid bruits, no thyromegaly. Lungs: Clear to auscultation, nonlabored breathing at rest. Cardiac: Regular rate and rhythm, no S3, 2/6 systolic murmur, no pericardial rub. Abdomen: Soft, nontender, obese, bowel sounds present, no guarding or rebound. Extremities: Trace edema, distal pulses 2+. Skin: Warm and dry. Musculoskeletal: No kyphosis. Neuropsychiatric: Alert and oriented x3, affect grossly appropriate.   Problem List and Plan   Coronary atherosclerosis of native coronary artery History of minor, nonobstructive disease documented at cardiac catheterization June 2011. He reports no progressive symptoms since that time, no CHF, ECG is stable. He does have a systolic murmur on examination, and NYHA class II dyspnea which is likely multifactorial. We will obtain an echocardiogram for basic cardiac structural assessment and evaluation of his valvular status, although no other ischemic testing is anticipated at this time. Followup scheduled down the road.  Essential hypertension, benign Current medications review. Patient states this has been difficult to control. Likely complicated by his obesity and uncontrolled sleep apnea.  HYPERLIPIDEMIA Recent lipid numbers reviewed, LDL 112. He is on high-dose  Pravachol.  Aortic atherosclerosis History of penetrating aortic ulcer status post endograft repair in 2004. He continues to follow with [facility].    Satira Sark, M.D., F.A.C.C.

## 2012-12-31 NOTE — Patient Instructions (Addendum)
Your physician recommends that you schedule a follow-up appointment in: 1 year. You will receive a reminder letter in the mail in about 10 months reminding you to call and schedule your appointment. If you don't receive this letter, please contact our office. Your physician recommends that you continue on your current medications as directed. Please refer to the Current Medication list given to you today. Your physician has requested that you have an echocardiogram. Echocardiography is a painless test that uses sound waves to create images of your heart. It provides your doctor with information about the size and shape of your heart and how well your heart's chambers and valves are working. This procedure takes approximately one hour. There are no restrictions for this procedure.  

## 2012-12-31 NOTE — Assessment & Plan Note (Signed)
History of minor, nonobstructive disease documented at cardiac catheterization June 2011. He reports no progressive symptoms since that time, no CHF, ECG is stable. He does have a systolic murmur on examination, and NYHA class II dyspnea which is likely multifactorial. We will obtain an echocardiogram for basic cardiac structural assessment and evaluation of his valvular status, although no other ischemic testing is anticipated at this time. Followup scheduled down the road.

## 2012-12-31 NOTE — Assessment & Plan Note (Signed)
History of penetrating aortic ulcer status post endograft repair in 2004. He continues to follow with [facility].

## 2012-12-31 NOTE — Assessment & Plan Note (Signed)
Current medications review. Patient states this has been difficult to control. Likely complicated by his obesity and uncontrolled sleep apnea.

## 2012-12-31 NOTE — Assessment & Plan Note (Signed)
Recent lipid numbers reviewed, LDL 112. He is on high-dose Pravachol.

## 2013-01-02 ENCOUNTER — Other Ambulatory Visit: Payer: Self-pay | Admitting: Family Medicine

## 2013-01-02 DIAGNOSIS — G2581 Restless legs syndrome: Secondary | ICD-10-CM

## 2013-01-02 MED ORDER — CARBIDOPA-LEVODOPA ER 50-200 MG PO TBCR: EXTENDED_RELEASE_TABLET | ORAL | Status: DC

## 2013-01-06 NOTE — Progress Notes (Signed)
Pt picked up new medication

## 2013-01-07 ENCOUNTER — Ambulatory Visit (HOSPITAL_COMMUNITY)
Admission: RE | Admit: 2013-01-07 | Discharge: 2013-01-07 | Disposition: A | Discharge status: Home / Self Care | Payer: Medicare Other | Source: Ambulatory Visit | Attending: Cardiology | Admitting: Cardiology

## 2013-01-07 DIAGNOSIS — I359 Nonrheumatic aortic valve disorder, unspecified: Secondary | ICD-10-CM | POA: Diagnosis not present

## 2013-01-07 DIAGNOSIS — R0602 Shortness of breath: Secondary | ICD-10-CM | POA: Diagnosis not present

## 2013-01-07 DIAGNOSIS — J449 Chronic obstructive pulmonary disease, unspecified: Secondary | ICD-10-CM | POA: Diagnosis not present

## 2013-01-07 DIAGNOSIS — I10 Essential (primary) hypertension: Secondary | ICD-10-CM | POA: Diagnosis not present

## 2013-01-07 DIAGNOSIS — J4489 Other specified chronic obstructive pulmonary disease: Secondary | ICD-10-CM | POA: Insufficient documentation

## 2013-01-07 DIAGNOSIS — Z6841 Body Mass Index (BMI) 40.0 and over, adult: Secondary | ICD-10-CM | POA: Diagnosis not present

## 2013-01-07 DIAGNOSIS — F17200 Nicotine dependence, unspecified, uncomplicated: Secondary | ICD-10-CM | POA: Insufficient documentation

## 2013-01-07 DIAGNOSIS — I2510 Atherosclerotic heart disease of native coronary artery without angina pectoris: Secondary | ICD-10-CM

## 2013-01-07 NOTE — Progress Notes (Signed)
[facility]   2D echo completed 01/07/2013.   Jamison Neighbor, RDCS

## 2013-01-08 ENCOUNTER — Encounter: Payer: Self-pay | Admitting: *Deleted

## 2013-01-14 ENCOUNTER — Ambulatory Visit (INDEPENDENT_AMBULATORY_CARE_PROVIDER_SITE_OTHER): Payer: Medicare Other

## 2013-01-14 ENCOUNTER — Ambulatory Visit (INDEPENDENT_AMBULATORY_CARE_PROVIDER_SITE_OTHER): Payer: Medicare Other | Admitting: Family Medicine

## 2013-01-14 ENCOUNTER — Encounter (INDEPENDENT_AMBULATORY_CARE_PROVIDER_SITE_OTHER): Payer: Self-pay

## 2013-01-14 ENCOUNTER — Encounter: Payer: Self-pay | Admitting: Family Medicine

## 2013-01-14 VITALS — BP 192/106 | HR 61 | Temp 97.7°F | Wt 276.6 lb

## 2013-01-14 DIAGNOSIS — F411 Generalized anxiety disorder: Secondary | ICD-10-CM | POA: Diagnosis not present

## 2013-01-14 DIAGNOSIS — M109 Gout, unspecified: Secondary | ICD-10-CM

## 2013-01-14 DIAGNOSIS — E785 Hyperlipidemia, unspecified: Secondary | ICD-10-CM

## 2013-01-14 DIAGNOSIS — J209 Acute bronchitis, unspecified: Secondary | ICD-10-CM

## 2013-01-14 DIAGNOSIS — R059 Cough, unspecified: Secondary | ICD-10-CM | POA: Diagnosis not present

## 2013-01-14 DIAGNOSIS — E669 Obesity, unspecified: Secondary | ICD-10-CM

## 2013-01-14 DIAGNOSIS — J42 Unspecified chronic bronchitis: Secondary | ICD-10-CM

## 2013-01-14 DIAGNOSIS — R05 Cough: Secondary | ICD-10-CM | POA: Insufficient documentation

## 2013-01-14 DIAGNOSIS — R7303 Prediabetes: Secondary | ICD-10-CM

## 2013-01-14 DIAGNOSIS — F4310 Post-traumatic stress disorder, unspecified: Secondary | ICD-10-CM

## 2013-01-14 DIAGNOSIS — K219 Gastro-esophageal reflux disease without esophagitis: Secondary | ICD-10-CM

## 2013-01-14 DIAGNOSIS — J449 Chronic obstructive pulmonary disease, unspecified: Secondary | ICD-10-CM | POA: Diagnosis not present

## 2013-01-14 DIAGNOSIS — M1710 Unilateral primary osteoarthritis, unspecified knee: Secondary | ICD-10-CM | POA: Diagnosis not present

## 2013-01-14 DIAGNOSIS — G2581 Restless legs syndrome: Secondary | ICD-10-CM

## 2013-01-14 DIAGNOSIS — E538 Deficiency of other specified B group vitamins: Secondary | ICD-10-CM | POA: Insufficient documentation

## 2013-01-14 DIAGNOSIS — N182 Chronic kidney disease, stage 2 (mild): Secondary | ICD-10-CM

## 2013-01-14 DIAGNOSIS — G4733 Obstructive sleep apnea (adult) (pediatric): Secondary | ICD-10-CM

## 2013-01-14 DIAGNOSIS — M170 Bilateral primary osteoarthritis of knee: Secondary | ICD-10-CM

## 2013-01-14 DIAGNOSIS — I2510 Atherosclerotic heart disease of native coronary artery without angina pectoris: Secondary | ICD-10-CM | POA: Diagnosis not present

## 2013-01-14 DIAGNOSIS — N200 Calculus of kidney: Secondary | ICD-10-CM

## 2013-01-14 DIAGNOSIS — F17200 Nicotine dependence, unspecified, uncomplicated: Secondary | ICD-10-CM

## 2013-01-14 DIAGNOSIS — R7309 Other abnormal glucose: Secondary | ICD-10-CM

## 2013-01-14 DIAGNOSIS — I10 Essential (primary) hypertension: Secondary | ICD-10-CM

## 2013-01-14 MED ORDER — BUDESONIDE-FORMOTEROL FUMARATE 160-4.5 MCG/ACT IN AERO: 2.0000 | INHALATION_SPRAY | Freq: Two times a day (BID) | RESPIRATORY_TRACT | Status: DC

## 2013-01-14 MED ORDER — DOXYCYCLINE HYCLATE 100 MG PO TABS: 100.0000 mg | ORAL_TABLET | Freq: Two times a day (BID) | ORAL | Status: DC

## 2013-01-14 MED ORDER — BENZONATATE 100 MG PO CAPS: 200.0000 mg | ORAL_CAPSULE | Freq: Three times a day (TID) | ORAL | Status: DC | PRN

## 2013-01-14 MED ORDER — DICLOFENAC SODIUM 1 % TD GEL: 2.0000 g | Freq: Four times a day (QID) | TRANSDERMAL | Status: DC

## 2013-01-14 NOTE — Progress Notes (Signed)
Patient ID: [name], male   DOB: 18-May-1945, 67 y.o.   MRN: [id number] SUBJECTIVE: CC: Chief Complaint  Patient presents with  . Acute Visit    cough states can not tolerate caridopa(sinimet) completed medrol dose pak finished antibiotic and still cougy     HPI: Has a cold and congestion. Wheezing. Has OSA and does not use a CPAP. This was Dx years ago in [place]. Cannot tolerate a CPAP and refuse to use it.  Loves to drink sodas. No chest pain.  Still continues to smoke. No fever , no chills, completed the recent medrol dose pack, and antibiotics.  Couldn't take the sinemet for leg cramps. He was hallucinating all night.  Continues to smoke and has cough and chest congestion. No chest pain. Brings up occasional clear phlegm. No hemoptysis. Occasional wheezes.   Past Medical History  Diagnosis Date  . Coronary atherosclerosis of native coronary artery     Mild atherosclerosis at catheterization 2011  . Gout   . Nephrolithiasis   . GERD (gastroesophageal reflux disease)   . Essential hypertension, benign   . COPD (chronic obstructive pulmonary disease)     "collapsed left lung" years ago also  . Sleep apnea     Intolerant of CPAP  . Aortic atherosclerosis     Penetrating aortic ulcer s/p endograft 2004 - followed by Dr. Trula Slade  . Prostate cancer    Past Surgical History  Procedure Laterality Date  . Aortic endograft repair  2004    Dr. [name]  . Colonoscopy w/ polypectomy    . Umbilical hernia repair    . Fracture surgery      Bilateral lower arms as a child  . Knee arthroscopy      Left  . Prostate biopsy  09/26/2011    Procedure: BIOPSY TRANSRECTAL ULTRASONIC PROSTATE (TUBP);  Surgeon: [name], MD;  Location: [facility];  Service: Urology;  Laterality: N/A;     . Circumcision  09/26/2011    Procedure: CIRCUMCISION ADULT;  Surgeon: [name], MD;  Location: [facility];  Service: Urology;  Laterality: N/A;  . Cystoscopy  09/26/2011    Procedure: CYSTOSCOPY;   Surgeon: [name], MD;  Location: [facility];  Service: Urology;  Laterality: N/A;  . Knee surgery  Sept. 26, 2013    Torn minisc.- Right  knee   History   Social History  . Marital Status: Married    Spouse Name: N/A    Number of Children: N/A  . Years of Education: N/A   Occupational History  . Not on file.   Social History Main Topics  . Smoking status: Current Every Day Smoker -- 0.50 packs/day for 42 years    Types: Cigarettes  . Smokeless tobacco: Never Used  . Alcohol Use: No  . Drug Use: No  . Sexual Activity: Not on file   Other Topics Concern  . Not on file   Social History Narrative  . No narrative on file   Family History  Problem Relation Age of Onset  . Stroke Mother   . Mesothelioma Father    Current Outpatient Prescriptions on File Prior to Visit  Medication Sig Dispense Refill  . allopurinol (ZYLOPRIM) 300 MG tablet Take 1 tablet (300 mg total) by mouth daily.  30 tablet  6  . amLODipine (NORVASC) 10 MG tablet TAKE ONE TABLET BY MOUTH ONCE DAILY  30 tablet  5  . carbidopa-levodopa (SINEMET CR) 50-200 MG per tablet Take one  po qhs  30 tablet  11  . carvedilol (COREG) 25 MG tablet Take 25 mg by mouth 2 (two) times daily.       . cloNIDine (CATAPRES) 0.1 MG tablet Take 2 tablets (0.2 mg total) by mouth 2 (two) times daily.  60 tablet    . losartan (COZAAR) 50 MG tablet TAKE ONE TABLET BY MOUTH ONCE DAILY  30 tablet  3  . meloxicam (MOBIC) 15 MG tablet       . pantoprazole (PROTONIX) 40 MG tablet Take 40 mg by mouth 2 (two) times daily.       . potassium chloride SA (K-DUR,KLOR-CON) 20 MEQ tablet Take 1 tablet (20 mEq total) by mouth 2 (two) times daily.  60 tablet  3  . pravastatin (PRAVACHOL) 80 MG tablet Take 1 tablet (80 mg total) by mouth every evening.  30 tablet  6  . spironolactone (ALDACTONE) 25 MG tablet TAKE ONE-HALF TABLET BY MOUTH EVERY DAY  15 tablet  2  . VOLTAREN 1 % GEL        No current facility-administered medications on file prior to  visit.   Allergies  Allergen Reactions  . Carbidopa-Levodopa     hallunications  . Morphine Sulfate Nausea And Vomiting  . Sulfacetamide Sodium Nausea And Vomiting  . Sulfamethoxazole-Trimethoprim Nausea And Vomiting   Immunization History  Administered Date(s) Administered  . Influenza Whole 01/31/2007, 01/20/2008   Prior to Admission medications   Medication Sig Start Date End Date Taking? Authorizing Provider  allopurinol (ZYLOPRIM) 300 MG tablet Take 1 tablet (300 mg total) by mouth daily. 10/18/12   Vernie Shanks, MD  amLODipine (NORVASC) 10 MG tablet TAKE ONE TABLET BY MOUTH ONCE DAILY 10/15/12   Vernie Shanks, MD  carbidopa-levodopa (SINEMET CR) 50-200 MG per tablet Take one po qhs 01/02/13   [name], FNP  carvedilol (COREG) 25 MG tablet Take 25 mg by mouth 2 (two) times daily.     Historical Provider, MD  cloNIDine (CATAPRES) 0.1 MG tablet Take 2 tablets (0.2 mg total) by mouth 2 (two) times daily. 10/15/12   Vernie Shanks, MD  losartan (COZAAR) 50 MG tablet TAKE ONE TABLET BY MOUTH ONCE DAILY 08/20/12   Vernie Shanks, MD  meloxicam [facility]) 15 MG tablet  12/09/12   Historical Provider, MD  pantoprazole (PROTONIX) 40 MG tablet Take 40 mg by mouth 2 (two) times daily.     Historical Provider, MD  potassium chloride SA (K-DUR,KLOR-CON) 20 MEQ tablet Take 1 tablet (20 mEq total) by mouth 2 (two) times daily. 12/26/12   [name], FNP  pravastatin (PRAVACHOL) 80 MG tablet Take 1 tablet (80 mg total) by mouth every evening. 10/18/12   Vernie Shanks, MD  spironolactone (ALDACTONE) 25 MG tablet TAKE ONE-HALF TABLET BY MOUTH EVERY DAY 09/20/12   Susy Frizzle, MD  VOLTAREN 1 % GEL  06/14/12   Historical Provider, MD    ROS: As above in the HPI. All other systems are stable or negative.  OBJECTIVE: APPEARANCE:  Patient in no acute distress.The patient appeared well nourished and normally developed. Acyanotic. Waist: VITAL SIGNS:BP 192/106  Pulse 61  Temp(Src) 97.7  F (36.5 C) (Oral)  Wt 276 lb 9.6 oz (125.465 kg)  BMI 42.07 kg/m2  WM Morbidly Obese 165/105  SKIN: warm and  Dry without overt rashes, tattoos and scars  HEAD and Neck: without JVD, Head and scalp: normal Eyes:No scleral icterus. Fundi normal, eye movements normal. Ears:  Auricle normal, canal normal, Tympanic membranes normal, insufflation normal. Nose: normal Throat: normal Neck & thyroid: normal  CHEST & LUNGS: Chest wall: normal Lungs: scatterred Rhonchi and wheezes. No Rales.  CVS: Reveals the PMI to be normally located. Regular rhythm, First and Second Heart sounds are normal,  absence of murmurs, rubs or gallops. Peripheral vasculature: Radial pulses: normal Dorsal pedis pulses: normal Posterior pulses: normal  ABDOMEN:  Appearance: morbidly obeseBenign, no organomegaly, no masses, no Abdominal Aortic enlargement. No Guarding , no rebound. No Bruits. Bowel sounds: normal  RECTAL: N/A GU: N/A  EXTREMETIES: nonedematous.  MUSCULOSKELETAL:  Spine: normal Joints: intact  NEUROLOGIC: oriented to time,place and person; nonfocal. Strength is normal Sensory is normal Reflexes are normal Cranial Nerves are normal.  ASSESSMENT: Cough - Plan: DG Chest 2 View  COPD (chronic obstructive pulmonary disease)  ANXIETY  Arthritis of both knees - Plan: diclofenac sodium (VOLTAREN) 1 % GEL  CHRONIC KIDNEY DISEASE STAGE II (MILD)  Coronary atherosclerosis of native coronary artery  Essential hypertension, benign  GERD  GOUT  HYPERLIPIDEMIA  Kidney stone  OBESITY NOS  VITAMIN B12 DEFICIENCY  TOBACCO ABUSE  RLS (restless legs syndrome)  PTSD  Prediabetes  Acute bronchitis - Plan: benzonatate (TESSALON PERLES) 100 MG capsule  Unspecified chronic bronchitis - Plan: doxycycline (VIBRA-TABS) 100 MG tablet, benzonatate (TESSALON PERLES) 100 MG capsule, budesonide-formoterol (SYMBICORT) 160-4.5 MCG/ACT inhaler  Tobacco use disorder - Plan:  budesonide-formoterol (SYMBICORT) 160-4.5 MCG/ACT inhaler  OSA (obstructive sleep apnea) - Plan: Ambulatory referral to Sleep Studies  Major problems contributing to his  Symptoms are his ongoiung smoking, his untreated OSA, and his Obesity and some superimposed GERD.  PLAN: Orders Placed This Encounter  Procedures  . DG Chest 2 View    Standing Status: Future     Number of Occurrences: 1     Standing Expiration Date: 03/16/2014    Order Specific Question:  Reason for Exam (SYMPTOM  OR DIAGNOSIS REQUIRED)    Answer:  cough    Order Specific Question:  Preferred imaging location?    Answer:  Internal  . Ambulatory referral to Sleep Studies    Referral Priority:  Routine    Referral Type:  Consultation    Referral Reason:  Specialty Services Required    Number of Visits Requested:  1    counseled on significant lifestyle changes that the patient needs to do to improve his outcome. Counseled on the OSA and effects on the heart and overall health. Significant morbidity risks with his ongoing smoking smoking. Need for weight loss with change in habits , exercise and dietary change to a plant based  Diet.   [facility] reading (PRIMARY) by  Dr. Jacelyn Grip: increased markings. Bronchitis .       Dr Paula Libra Recommendations  For nutrition information, I recommend books:  1).Eat to Live by Dr Excell Seltzer. 2).Prevent and Reverse Heart Disease by Dr Karl Luke. 3) Dr Janene Harvey Book:  Program to Reverse Diabetes  Exercise recommendations are:  If unable to walk, then the patient can exercise in a chair 3 times a day. By flapping arms like a bird gently and raising legs outwards to the front.  If ambulatory, the patient can go for walks for 30 minutes 3 times a week. Then increase the intensity and duration as tolerated.  Goal is to try to attain exercise frequency to 5 times a week.  If applicable: Best to perform resistance exercises (machines or weights) 2 days a week and  cardio  type exercises 3 days per week.  Meds ordered this encounter  Medications  . doxycycline (VIBRA-TABS) 100 MG tablet    Sig: Take 1 tablet (100 mg total) by mouth 2 (two) times daily.    Dispense:  20 tablet    Refill:  0  . benzonatate (TESSALON PERLES) 100 MG capsule    Sig: Take 2 capsules (200 mg total) by mouth 3 (three) times daily as needed for cough.    Dispense:  20 capsule    Refill:  3    Order Specific Question:  Supervising Provider    Answer:  Chipper Herb [[id number]]  . budesonide-formoterol (SYMBICORT) 160-4.5 MCG/ACT inhaler    Sig: Inhale 2 puffs into the lungs 2 (two) times daily.    Dispense:  1 Inhaler    Refill:  12  . diclofenac sodium (VOLTAREN) 1 % GEL    Sig: Apply 2 g topically 4 (four) times daily.    Dispense:  100 g    Refill:  2   GERD precautions.  Return in about 3 weeks (around 02/04/2013) for Recheck medical problems.  Denaisha Swango P. Jacelyn Grip, M.D.

## 2013-01-14 NOTE — Patient Instructions (Signed)
      Dr Howell Groesbeck's Recommendations  For nutrition information, I recommend books:  1).Eat to Live by Dr Joel Fuhrman. 2).Prevent and Reverse Heart Disease by Dr Caldwell Esselstyn. 3) Dr Neal Barnard's Book:  Program to Reverse Diabetes  Exercise recommendations are:  If unable to walk, then the patient can exercise in a chair 3 times a day. By flapping arms like a bird gently and raising legs outwards to the front.  If ambulatory, the patient can go for walks for 30 minutes 3 times a week. Then increase the intensity and duration as tolerated.  Goal is to try to attain exercise frequency to 5 times a week.  If applicable: Best to perform resistance exercises (machines or weights) 2 days a week and cardio type exercises 3 days per week.  

## 2013-01-16 ENCOUNTER — Other Ambulatory Visit: Payer: Self-pay | Admitting: *Deleted

## 2013-01-16 DIAGNOSIS — G4733 Obstructive sleep apnea (adult) (pediatric): Secondary | ICD-10-CM

## 2013-01-21 ENCOUNTER — Encounter: Payer: Self-pay | Admitting: Family Medicine

## 2013-01-21 ENCOUNTER — Ambulatory Visit (INDEPENDENT_AMBULATORY_CARE_PROVIDER_SITE_OTHER): Payer: Medicare Other | Admitting: Family Medicine

## 2013-01-21 VITALS — BP 144/97 | HR 94 | Temp 98.2°F | Ht 68.0 in | Wt 276.0 lb

## 2013-01-21 DIAGNOSIS — H60392 Other infective otitis externa, left ear: Secondary | ICD-10-CM

## 2013-01-21 DIAGNOSIS — H60399 Other infective otitis externa, unspecified ear: Secondary | ICD-10-CM | POA: Diagnosis not present

## 2013-01-21 DIAGNOSIS — R42 Dizziness and giddiness: Secondary | ICD-10-CM | POA: Diagnosis not present

## 2013-01-21 MED ORDER — MECLIZINE HCL 25 MG PO TABS: 25.0000 mg | ORAL_TABLET | Freq: Three times a day (TID) | ORAL | Status: DC | PRN

## 2013-01-21 MED ORDER — NEOMYCIN-POLYMYXIN-HC 3.5-10000-1 OT SOLN: 3.0000 [drp] | Freq: Four times a day (QID) | OTIC | Status: DC

## 2013-01-21 MED ORDER — FLUTICASONE PROPIONATE 50 MCG/ACT NA SUSP: 2.0000 | Freq: Every day | NASAL | Status: DC

## 2013-01-21 NOTE — Progress Notes (Signed)
  Subjective:    Patient ID: THEDORE Hanson, male    DOB: 08/26/1945, 67 y.o.   MRN: [id number]  HPI This 67 y.o. male presents for evaluation of left ear discomfort and vertigo sx's. He has had  A URI for the last few weeks.  He states he has some left ear discomfort but no discharge.   Review of Systems C/o left ear discomfort and vertigo No chest pain, SOB, HA, dizziness, vision change, N/V, diarrhea, constipation, dysuria, urinary urgency or frequency, myalgias, arthralgias or rash.     Objective:   Physical Exam Vital signs noted  Well developed well nourished male.  HEENT - Head atraumatic Normocephalic                Eyes - PERRLA, Conjuctiva - clear Sclera- Clear EOMI                Ears - EAC's Wnl TM's Wnl Gross Hearing WNL.  Left ear tender.                Nose - Nares patent                 Throat - oropharanx wnl Respiratory - Lungs CTA bilateral Cardiac - RRR S1 and S2 without murmur GI - Abdomen soft Nontender and bowel sounds active x 4 Extremities - No edema. Neuro - Grossly intact.       Assessment & Plan:  Otitis, externa, infective, left - Plan: neomycin-polymyxin-hydrocortisone (CORTISPORIN) otic solution, fluticasone (FLONASE) 50 MCG/ACT nasal spray  Vertigo - Plan: meclizine (ANTIVERT) 25 MG tablet, fluticasone (FLONASE) 50 MCG/ACT nasal spray  Discussed with patient that he may be developing some left otitis externa.  He may also be having some ETD sx's.  Discussed He use the nasal spray and Ear gtt's and follow up if not better.  [name] FNP

## 2013-01-21 NOTE — Patient Instructions (Signed)
Otitis Externa Otitis externa is a bacterial or fungal infection of the outer ear canal. This is the area from the eardrum to the outside of the ear. Otitis externa is sometimes called "swimmer's ear." CAUSES  Possible causes of infection include:  Swimming in dirty water.  Moisture remaining in the ear after swimming or bathing.  Mild injury (trauma) to the ear.  Objects stuck in the ear (foreign body).  Cuts or scrapes (abrasions) on the outside of the ear. SYMPTOMS  The first symptom of infection is often itching in the ear canal. Later signs and symptoms may include swelling and redness of the ear canal, ear pain, and yellowish-white fluid (pus) coming from the ear. The ear pain may be worse when pulling on the earlobe. DIAGNOSIS  Your caregiver will perform a physical exam. A sample of fluid may be taken from the ear and examined for bacteria or fungi. TREATMENT  Antibiotic ear drops are often given for 10 to 14 days. Treatment may also include pain medicine or corticosteroids to reduce itching and swelling. PREVENTION   Keep your ear dry. Use the corner of a towel to absorb water out of the ear canal after swimming or bathing.  Avoid scratching or putting objects inside your ear. This can damage the ear canal or remove the protective wax that lines the canal. This makes it easier for bacteria and fungi to grow.  Avoid swimming in lakes, polluted water, or poorly chlorinated pools.  You may use ear drops made of rubbing alcohol and vinegar after swimming. Combine equal parts of white vinegar and alcohol in a bottle. Put 3 or 4 drops into each ear after swimming. HOME CARE INSTRUCTIONS   Apply antibiotic ear drops to the ear canal as prescribed by your caregiver.  Only take over-the-counter or prescription medicines for pain, discomfort, or fever as directed by your caregiver.  If you have diabetes, follow any additional treatment instructions from your caregiver.  Keep all  follow-up appointments as directed by your caregiver. SEEK MEDICAL CARE IF:   You have a fever.  Your ear is still red, swollen, painful, or draining pus after 3 days.  Your redness, swelling, or pain gets worse.  You have a severe headache.  You have redness, swelling, pain, or tenderness in the area behind your ear. MAKE SURE YOU:   Understand these instructions.  Will watch your condition.  Will get help right away if you are not doing well or get worse. Document Released: 03/20/2005 Document Revised: 06/12/2011 Document Reviewed: 04/06/2011 [employer] Patient Information 2014 [employer].  

## 2013-01-23 ENCOUNTER — Telehealth: Payer: Self-pay | Admitting: Family Medicine

## 2013-01-23 NOTE — Telephone Encounter (Signed)
Losartan 50 mg

## 2013-01-24 NOTE — Telephone Encounter (Signed)
I have never seen this patient- B. oxfords

## 2013-01-27 DIAGNOSIS — C61 Malignant neoplasm of prostate: Secondary | ICD-10-CM | POA: Diagnosis not present

## 2013-01-28 ENCOUNTER — Other Ambulatory Visit: Payer: Self-pay | Admitting: Family Medicine

## 2013-01-28 MED ORDER — LOSARTAN POTASSIUM 50 MG PO TABS: 50.0000 mg | ORAL_TABLET | Freq: Every day | ORAL | Status: DC

## 2013-02-03 ENCOUNTER — Ambulatory Visit (INDEPENDENT_AMBULATORY_CARE_PROVIDER_SITE_OTHER): Payer: Medicare Other | Admitting: Family Medicine

## 2013-02-03 ENCOUNTER — Encounter: Payer: Self-pay | Admitting: Family Medicine

## 2013-02-03 VITALS — BP 178/97 | HR 71 | Temp 97.7°F | Ht 68.0 in | Wt 279.0 lb

## 2013-02-03 DIAGNOSIS — I2510 Atherosclerotic heart disease of native coronary artery without angina pectoris: Secondary | ICD-10-CM

## 2013-02-03 DIAGNOSIS — J4489 Other specified chronic obstructive pulmonary disease: Secondary | ICD-10-CM | POA: Diagnosis not present

## 2013-02-03 DIAGNOSIS — J449 Chronic obstructive pulmonary disease, unspecified: Secondary | ICD-10-CM | POA: Diagnosis not present

## 2013-02-03 DIAGNOSIS — Z23 Encounter for immunization: Secondary | ICD-10-CM | POA: Insufficient documentation

## 2013-02-03 DIAGNOSIS — N182 Chronic kidney disease, stage 2 (mild): Secondary | ICD-10-CM

## 2013-02-03 MED ORDER — FLUTICASONE FUROATE-VILANTEROL 100-25 MCG/INH IN AEPB: 1.0000 | INHALATION_SPRAY | Freq: Every day | RESPIRATORY_TRACT | Status: DC

## 2013-02-03 MED ORDER — ACLIDINIUM BROMIDE 400 MCG/ACT IN AEPB: 1.0000 | INHALATION_SPRAY | Freq: Two times a day (BID) | RESPIRATORY_TRACT | Status: DC

## 2013-02-03 NOTE — Progress Notes (Signed)
Patient ID: [name], male   DOB: May 12, 1945, 67 y.o.   MRN: [id number] SUBJECTIVE: CC: Chief Complaint  Patient presents with  . Follow-up    3 WEEK FOLLOW UP  reck left ear    HPI:  Here for  Recheck of his Chronic Bronchitis and Otitis externa.the ear is better. Still coughs and wheezes. The inhalers were expensive and  Did not get them all. Here to follow up on the COPD , chronic bronchitis   Past Medical History  Diagnosis Date  . Coronary atherosclerosis of native coronary artery     Mild atherosclerosis at catheterization 2011  . Gout   . Nephrolithiasis   . GERD (gastroesophageal reflux disease)   . Essential hypertension, benign   . Sleep apnea     Intolerant of CPAP  . Aortic atherosclerosis     Penetrating aortic ulcer s/p endograft 2004 - followed by Dr. Trula Slade  . Prostate cancer   . COPD (chronic obstructive pulmonary disease)     "collapsed left lung" years ago also   Past Surgical History  Procedure Laterality Date  . Aortic endograft repair  2004    Dr. [name]  . Colonoscopy w/ polypectomy    . Umbilical hernia repair    . Fracture surgery      Bilateral lower arms as a child  . Knee arthroscopy      Left  . Prostate biopsy  09/26/2011    Procedure: BIOPSY TRANSRECTAL ULTRASONIC PROSTATE (TUBP);  Surgeon: [name], MD;  Location: [facility];  Service: Urology;  Laterality: N/A;     . Circumcision  09/26/2011    Procedure: CIRCUMCISION ADULT;  Surgeon: [name], MD;  Location: [facility];  Service: Urology;  Laterality: N/A;  . Cystoscopy  09/26/2011    Procedure: CYSTOSCOPY;  Surgeon: [name], MD;  Location: [facility];  Service: Urology;  Laterality: N/A;  . Knee surgery  Sept. 26, 2013    Torn minisc.- Right  knee   History   Social History  . Marital Status: Married    Spouse Name: N/A    Number of Children: N/A  . Years of Education: N/A   Occupational History  . Not on file.   Social History Main Topics  . Smoking status:  Current Every Day Smoker -- 0.50 packs/day for 42 years    Types: Cigarettes  . Smokeless tobacco: Never Used  . Alcohol Use: No  . Drug Use: No  . Sexual Activity: Not on file   Other Topics Concern  . Not on file   Social History Narrative  . No narrative on file   Family History  Problem Relation Age of Onset  . Stroke Mother   . Mesothelioma Father    Current Outpatient Prescriptions on File Prior to Visit  Medication Sig Dispense Refill  . allopurinol (ZYLOPRIM) 300 MG tablet Take 1 tablet (300 mg total) by mouth daily.  30 tablet  6  . amLODipine (NORVASC) 10 MG tablet TAKE ONE TABLET BY MOUTH ONCE DAILY  30 tablet  5  . carvedilol (COREG) 25 MG tablet Take 25 mg by mouth 2 (two) times daily.       . cloNIDine (CATAPRES) 0.1 MG tablet Take 2 tablets (0.2 mg total) by mouth 2 (two) times daily.  60 tablet    . diclofenac sodium (VOLTAREN) 1 % GEL Apply 2 g topically 4 (four) times daily.  100 g  2  . fluticasone (FLONASE) 50 MCG/ACT  nasal spray Place 2 sprays into the nose daily.  16 g  6  . losartan (COZAAR) 50 MG tablet Take 1 tablet (50 mg total) by mouth daily.  30 tablet  11  . meclizine (ANTIVERT) 25 MG tablet Take 1 tablet (25 mg total) by mouth 3 (three) times daily as needed.  30 tablet  0  . meloxicam (MOBIC) 15 MG tablet       . pantoprazole (PROTONIX) 40 MG tablet Take 40 mg by mouth 2 (two) times daily.       . potassium chloride SA (K-DUR,KLOR-CON) 20 MEQ tablet Take 1 tablet (20 mEq total) by mouth 2 (two) times daily.  60 tablet  3  . pravastatin (PRAVACHOL) 80 MG tablet Take 1 tablet (80 mg total) by mouth every evening.  30 tablet  6  . spironolactone (ALDACTONE) 25 MG tablet TAKE ONE-HALF TABLET BY MOUTH EVERY DAY  15 tablet  2  . neomycin-polymyxin-hydrocortisone (CORTISPORIN) otic solution Place 3 drops into the left ear 4 (four) times daily.  10 mL  0   No current facility-administered medications on file prior to visit.   Allergies  Allergen Reactions   . Carbidopa-Levodopa     hallunications  . Morphine Sulfate Nausea And Vomiting  . Sulfacetamide Sodium Nausea And Vomiting  . Sulfamethoxazole-Trimethoprim Nausea And Vomiting   Immunization History  Administered Date(s) Administered  . Influenza Whole 01/31/2007, 01/20/2008  . Influenza,inj,Quad PF,36+ Mos 02/03/2013   Prior to Admission medications   Medication Sig Start Date End Date Taking? Authorizing Provider  allopurinol (ZYLOPRIM) 300 MG tablet Take 1 tablet (300 mg total) by mouth daily. 10/18/12   Vernie Shanks, MD  amLODipine (NORVASC) 10 MG tablet TAKE ONE TABLET BY MOUTH ONCE DAILY 10/15/12   Vernie Shanks, MD  amoxicillin (AMOXIL) 875 MG tablet  12/26/12   Historical Provider, MD  benzonatate (TESSALON PERLES) 100 MG capsule Take 2 capsules (200 mg total) by mouth 3 (three) times daily as needed for cough. 01/14/13   Vernie Shanks, MD  budesonide-formoterol [facility]) 160-4.5 MCG/ACT inhaler Inhale 2 puffs into the lungs 2 (two) times daily. 01/14/13   Vernie Shanks, MD  carvedilol (COREG) 25 MG tablet Take 25 mg by mouth 2 (two) times daily.     Historical Provider, MD  cloNIDine (CATAPRES) 0.1 MG tablet Take 2 tablets (0.2 mg total) by mouth 2 (two) times daily. 10/15/12   Vernie Shanks, MD  diclofenac sodium (VOLTAREN) 1 % GEL Apply 2 g topically 4 (four) times daily. 01/14/13   Vernie Shanks, MD  doxycycline (VIBRA-TABS) 100 MG tablet Take 1 tablet (100 mg total) by mouth 2 (two) times daily. 01/14/13   Vernie Shanks, MD  fluticasone (FLONASE) 50 MCG/ACT nasal spray Place 2 sprays into the nose daily. 01/21/13   [name], FNP  losartan (COZAAR) 50 MG tablet Take 1 tablet (50 mg total) by mouth daily. 01/28/13   [name], FNP  meclizine (ANTIVERT) 25 MG tablet Take 1 tablet (25 mg total) by mouth 3 (three) times daily as needed. 01/21/13   [name], FNP  meloxicam (MOBIC) 15 MG tablet  12/09/12   Historical Provider, MD   neomycin-polymyxin-hydrocortisone (CORTISPORIN) otic solution Place 3 drops into the left ear 4 (four) times daily. 01/21/13   [name], FNP  pantoprazole (PROTONIX) 40 MG tablet Take 40 mg by mouth 2 (two) times daily.     Historical Provider, MD  potassium chloride  SA (K-DUR,KLOR-CON) 20 MEQ tablet Take 1 tablet (20 mEq total) by mouth 2 (two) times daily. 12/26/12   [name], FNP  pravastatin (PRAVACHOL) 80 MG tablet Take 1 tablet (80 mg total) by mouth every evening. 10/18/12   Vernie Shanks, MD  spironolactone (ALDACTONE) 25 MG tablet TAKE ONE-HALF TABLET BY MOUTH EVERY DAY 09/20/12   Susy Frizzle, MD     ROS: As above in the HPI. All other systems are stable or negative.  OBJECTIVE: APPEARANCE:  Patient in no acute distress.The patient appeared well nourished and normally developed. Acyanotic. Waist: VITAL SIGNS:BP 178/97  Pulse 71  Temp(Src) 97.7 F (36.5 C) (Oral)  Ht 5\' 8"  (1.727 m)  Wt 279 lb (126.554 kg)  BMI 42.43 kg/m2 Obese WM BP 135/90  SKIN: warm and  Dry without overt rashes, tattoos and scars  HEAD and Neck: without JVD, Head and scalp: normal Eyes:No scleral icterus. Fundi normal, eye movements normal. Ears: Auricle normal, canal normal, Tympanic membranes normal, insufflation normal. Nose: normal Throat: normal Neck & thyroid: normal  CHEST & LUNGS: Chest wall: normal Lungs: Coarse breath sounds. Few scattered wheezes and rhonchi  CVS: Reveals the PMI to be normally located. Regular rhythm, First and Second Heart sounds are normal,  absence of murmurs, rubs or gallops. Peripheral vasculature: Radial pulses: normal Dorsal pedis pulses: normal Posterior pulses: normal  ABDOMEN:  Appearance: morbidly obese Benign, no organomegaly, no masses, no Abdominal Aortic enlargement. No Guarding , no rebound. No Bruits. Bowel sounds: normal  RECTAL: N/A GU: N/A  EXTREMETIES: nonedematous.  MUSCULOSKELETAL:  Spine: normal Joints:  intact  NEUROLOGIC: oriented to time,place and person; nonfocal.  ASSESSMENT: COPD (chronic obstructive pulmonary disease)  CHRONIC KIDNEY DISEASE STAGE II (MILD)  Need for prophylactic vaccination and inoculation against influenza  Coronary atherosclerosis of native coronary artery  PLAN: Since he was able to purchase the symbicort, gave him samples of alternates.  No orders of the defined types were placed in this encounter.   Meds ordered this encounter  Medications  . Fluticasone Furoate-Vilanterol (BREO ELLIPTA) 100-25 MCG/INH AEPB    Sig: Inhale 1 puff into the lungs daily.    Dispense:  2 each    Refill:  o  . Aclidinium Bromide (TUDORZA PRESSAIR) 400 MCG/ACT AEPB    Sig: Inhale 1 puff into the lungs 2 (two) times daily.    Dispense:  2 each    Refill:  0  counselled on use of these newer medications.  Lifestyle changes counseled. Weight loss, diet exercise.recommended.  Medications Discontinued During This Encounter  Medication Reason  . amoxicillin (AMOXIL) 875 MG tablet Completed Course  . doxycycline (VIBRA-TABS) 100 MG tablet Completed Course  . budesonide-formoterol (SYMBICORT) 160-4.5 MCG/ACT inhaler Patient has not taken in last 30 days  . benzonatate (TESSALON PERLES) 100 MG capsule Completed Course   Return in about 4 weeks (around 03/03/2013) for Recheck medical problems.  Fredderick Swanger P. Jacelyn Grip, M.D.

## 2013-02-10 ENCOUNTER — Other Ambulatory Visit: Payer: Self-pay | Admitting: Family Medicine

## 2013-02-12 ENCOUNTER — Other Ambulatory Visit: Payer: Self-pay

## 2013-02-12 MED ORDER — CARVEDILOL 25 MG PO TABS: 25.0000 mg | ORAL_TABLET | Freq: Two times a day (BID) | ORAL | Status: DC

## 2013-02-17 ENCOUNTER — Ambulatory Visit: Payer: Medicare Other | Admitting: Neurosurgery

## 2013-02-18 ENCOUNTER — Encounter: Payer: Self-pay | Admitting: Family

## 2013-02-19 ENCOUNTER — Encounter: Payer: Self-pay | Admitting: Family

## 2013-02-19 ENCOUNTER — Ambulatory Visit (HOSPITAL_COMMUNITY)
Admission: RE | Admit: 2013-02-19 | Discharge: 2013-02-19 | Disposition: A | Discharge status: Home / Self Care | Payer: Medicare Other | Source: Ambulatory Visit | Attending: Family | Admitting: Family

## 2013-02-19 ENCOUNTER — Ambulatory Visit (INDEPENDENT_AMBULATORY_CARE_PROVIDER_SITE_OTHER): Payer: Medicare Other | Admitting: Family

## 2013-02-19 VITALS — BP 151/94 | HR 69 | Resp 16 | Ht 68.0 in | Wt 279.0 lb

## 2013-02-19 DIAGNOSIS — I714 Abdominal aortic aneurysm, without rupture, unspecified: Secondary | ICD-10-CM | POA: Insufficient documentation

## 2013-02-19 DIAGNOSIS — Z48812 Encounter for surgical aftercare following surgery on the circulatory system: Secondary | ICD-10-CM

## 2013-02-19 NOTE — Progress Notes (Signed)
[facility] [place]  Established [facility]  History of Present Illness  [name] is a 67 y.o. (1945/06/07) male who presents for routine follow up s/p EVAR.  He is status post endovascular aortic intervention on 09/13/2007 by Dr. [name]: an Endologix Powerlink bifurcated stent 25 x 16 x 120, proximal cuff 25 x 25 x 75. This was done for a penetrating ulcer. By CT scan a year ago, the above condition has completely resolved. Dr. Trula Slade will try to image this with ultrasound in the future to avoid getting frequent CT scans if it cannot be visualized in a year, would just have him come back the following year with a repeat attempt at ultrasound.  At patient's visit a year ago, Dr. Trula Slade had an extensive conversation with the patient regarding his need for weight loss; discussed were starting an exercise program and having a full chemistry profile, including thyroid panel to evaluate his potential explanations for his obesity. The patient has a history of prostate cancer.  Most recent CTA (Date: 07/29/12, ordered by Dr. Yaakov Guthrie) demonstrates: Stable appearance of the aortoiliac stent graft. The patient has no had back or abdominal pain. Pt. States he needs right knee replacement; he is asking if he can have an MRI of right knee and needs to know if the aortic stent will preclude this. Has right knee pain.  He denies hx of stroke or TIA symptoms, denies claudication symptoms, denies non-healing wounds. Pt Diabetic: No Pt smoker: smoker  (1/2 ppd  since age 4 yrs)   Past Medical History  Diagnosis Date  . Coronary atherosclerosis of native coronary artery     Mild atherosclerosis at catheterization 2011  . Gout   . Nephrolithiasis   . GERD (gastroesophageal reflux disease)   . Essential hypertension, benign   . Sleep apnea     Intolerant of CPAP  . Aortic atherosclerosis     Penetrating aortic ulcer s/p endograft 2004 - followed by Dr. Trula Slade  . Prostate  cancer   . COPD (chronic obstructive pulmonary disease)     "collapsed left lung" years ago also   Past Surgical History  Procedure Laterality Date  . Aortic endograft repair  2004    Dr. [name]  . Colonoscopy w/ polypectomy    . Umbilical hernia repair    . Fracture surgery      Bilateral lower arms as a child  . Knee arthroscopy      Left  . Prostate biopsy  09/26/2011    Procedure: BIOPSY TRANSRECTAL ULTRASONIC PROSTATE (TUBP);  Surgeon: [name], MD;  Location: [facility];  Service: Urology;  Laterality: N/A;     . Circumcision  09/26/2011    Procedure: CIRCUMCISION ADULT;  Surgeon: [name], MD;  Location: [facility];  Service: Urology;  Laterality: N/A;  . Cystoscopy  09/26/2011    Procedure: CYSTOSCOPY;  Surgeon: [name], MD;  Location: [facility];  Service: Urology;  Laterality: N/A;  . Knee surgery  Sept. 26, 2013    Torn minisc.- Right  knee   Social History History  Substance Use Topics  . Smoking status: Current Every Day Smoker -- 0.50 packs/day for 42 years    Types: Cigarettes  . Smokeless tobacco: Never Used  . Alcohol Use: No   Family History Family History  Problem Relation Age of Onset  . Stroke Mother   . Mesothelioma Father    Current Outpatient Prescriptions on File Prior to Visit  Medication Sig Dispense Refill  . Aclidinium Bromide (TUDORZA PRESSAIR) 400 MCG/ACT AEPB Inhale 1 puff into the lungs 2 (two) times daily.  2 each  0  . allopurinol (ZYLOPRIM) 300 MG tablet Take 1 tablet (300 mg total) by mouth daily.  30 tablet  6  . amLODipine (NORVASC) 10 MG tablet TAKE ONE TABLET BY MOUTH ONCE DAILY  30 tablet  5  . carvedilol (COREG) 25 MG tablet Take 1 tablet (25 mg total) by mouth 2 (two) times daily.  30 tablet  6  . cloNIDine (CATAPRES) 0.1 MG tablet Take 2 tablets (0.2 mg total) by mouth 2 (two) times daily.  60 tablet    . diclofenac sodium (VOLTAREN) 1 % GEL Apply 2 g topically 4 (four) times daily.  100 g  2  . fluticasone (FLONASE) 50 MCG/ACT  nasal spray Place 2 sprays into the nose daily.  16 g  6  . Fluticasone Furoate-Vilanterol (BREO ELLIPTA) 100-25 MCG/INH AEPB Inhale 1 puff into the lungs daily.  2 each  o  . losartan (COZAAR) 50 MG tablet Take 1 tablet (50 mg total) by mouth daily.  30 tablet  11  . meclizine (ANTIVERT) 25 MG tablet Take 1 tablet (25 mg total) by mouth 3 (three) times daily as needed.  30 tablet  0  . meloxicam (MOBIC) 15 MG tablet       . neomycin-polymyxin-hydrocortisone (CORTISPORIN) otic solution Place 3 drops into the left ear 4 (four) times daily.  10 mL  0  . pantoprazole (PROTONIX) 40 MG tablet Take 40 mg by mouth 2 (two) times daily.       . potassium chloride SA (K-DUR,KLOR-CON) 20 MEQ tablet Take 1 tablet (20 mEq total) by mouth 2 (two) times daily.  60 tablet  3  . pravastatin (PRAVACHOL) 80 MG tablet Take 1 tablet (80 mg total) by mouth every evening.  30 tablet  6  . spironolactone (ALDACTONE) 25 MG tablet TAKE ONE-HALF TABLET BY MOUTH EVERY DAY  15 tablet  2   No current facility-administered medications on file prior to visit.   Allergies  Allergen Reactions  . Carbidopa-Levodopa     hallunications  . Morphine Sulfate Nausea And Vomiting  . Sulfacetamide Sodium Nausea And Vomiting  . Sulfamethoxazole-Trimethoprim Nausea And Vomiting     ROS: [x]  Positive   [ ]  Negative   [ ]  All sytems reviewed and are negative  General: [ ]  Weight loss, [ ]  Fever, [ ]  chills Neurologic: [ ]  Dizziness, [ ]  Blackouts, [ ]  Seizure [ ]  Stroke, [ ]  "Mini stroke", [ ]  Slurred speech, [ ]  Temporary blindness; [ ]  weakness in arms or legs, [ ]  Hoarseness Cardiac: [ ]  Chest pain/pressure, [ ]  Shortness of breath at rest [ ]  Shortness of breath with exertion, [ ]  Atrial fibrillation or irregular heartbeat Vascular: [ ]  Pain in legs with walking, [ ]  Pain in legs at rest, [ ]  Pain in legs at night,  [ ]  Non-healing ulcer, [ ]  Blood clot in vein/DVT,   Pulmonary: [ ]  Home oxygen, [ ]  Productive cough, [ ]   Coughing up blood, [ ]  Asthma,  [ ]  Wheezing Musculoskeletal:  [ ]  Arthritis, [ ]  Low back pain, [ ]  Joint pain Hematologic: [ ]  Easy Bruising, [ ]  Anemia; [ ]  Hepatitis Gastrointestinal: [ ]  Blood in stool, [ ]  Gastroesophageal Reflux/heartburn, [ ]  Trouble swallowing Urinary: [ ]  chronic Kidney disease, [ ]  on HD - [ ]  MWF or [ ]   TTHS, [ ]  Burning with urination, [ ]  Difficulty urinating Skin: [ ]  Rashes, [ ]  Wounds Psychological: [ ]  Anxiety, [ ]  Depression  Physical Examination  Filed Vitals:   02/19/13 0953  BP: 151/94  Pulse: 69  Resp: 16   Filed Weights   02/19/13 0953  Weight: 279 lb (126.554 kg)   Body mass index is 42.43 kg/(m^2).  General: A&O x 3, WD morbidly Obese.  Pulmonary: Sym exp, good air movt, CTAB, no rales, rhonchi, & wheezing.  Cardiac: RRR, Nl S1, S2, no Murmurs.  Vascular: Vessel Right Left  Radial Palpable Palpable  Carotid  without bruit without bruit  Aorta Not palpable N/A  Popliteal Not palpable Not palpable  PT Palpable Palpable  DP Palpable Palpable   Gastrointestinal: soft, NTND, -G/R, - HSM, - masses.  Musculoskeletal: M/S 5/5 throughout, Extremities without ischemic changes.  Neurologic: Pain and light touch intact in extremities, Motor exam as listed above  Non-Invasive Vascular Imaging  EVAR Duplex (Date: 02/19/2013)  aorta size: 2.52 cm x 2.78 cm  Limited visualization of the abdominal vasculature due to bowel gas and body habitus.  Patent stent of the distal abdominal aorta with a maximum diameter of 2.78 cm, based on limited visualization.  Most recent CTA:  (Date: 07/29/12, ordered by Dr. Yaakov Guthrie) demonstrates: Stable appearance of the aortoiliac stent graft.   Medical Decision Making  [name] is a 67 y.o. male who presents s/p EVAR.  Pt is asymptomatic with aortic stent in place.  I discussed with the patient the importance of surveillance of the endograft.  Based on today's exam and Duplex results,  and after discussing with Dr. Scot Dock, the next endograft duplex will be scheduled for 12 months.   The patient will follow up with [place] in 12 months with these studies.   I emphasized the importance of maximal medical management including strict control of blood pressure, blood glucose, and lipid levels, antiplatelet agents, obtaining regular exercise, and cessation of smoking.    His aortic endograft is compatible with MRI use at field strengths of less than 1.5 Tesla; see manufacturer printed information given to patient.      Thank you for allowing [place] to participate in this patient's care.  Clemon Chambers, RN, MSN, FNP-C [facility] [place] Office: [phone]  Clinic Physician: Scot Dock  02/19/2013, 9:06 AM

## 2013-02-19 NOTE — Patient Instructions (Addendum)
Obesity Obesity is defined as having too much total body fat and a body mass index (BMI) of 30 or more. BMI is an estimate of body fat and is calculated from your height and weight. Obesity happens when you consume more calories than you can burn by exercising or performing daily physical tasks. Prolonged obesity can cause major illnesses or emergencies, such as:   A stroke.  Heart disease.  Diabetes.  Cancer.  Arthritis.  High blood pressure (hypertension).  High cholesterol.  Sleep apnea.  Erectile dysfunction.  Infertility problems. CAUSES   Regularly eating unhealthy foods.  Physical inactivity.  Certain disorders, such as an underactive thyroid (hypothyroidism), Cushing's syndrome, and polycystic ovarian syndrome.  Certain medicines, such as steroids, some depression medicines, and antipsychotics.  Genetics.  Lack of sleep. DIAGNOSIS  A caregiver can diagnose obesity after calculating your BMI. Obesity will be diagnosed if your BMI is 30 or higher.  There are other methods of measuring obesity levels. Some other methods include measuring your skin fold thickness, your waist circumference, and comparing your hip circumference to your waist circumference. TREATMENT  A healthy treatment program includes some or all of the following:  Long-term dietary changes.  Exercise and physical activity.  Behavioral and lifestyle changes.  Medicine only under the supervision of your caregiver. Medicines may help, but only if they are used with diet and exercise programs. An unhealthy treatment program includes:  Fasting.  Fad diets.  Supplements and drugs. These choices do not succeed in long-term weight control.  HOME CARE INSTRUCTIONS   Exercise and perform physical activity as directed by your caregiver. To increase physical activity, try the following:  Use stairs instead of elevators.  Park farther away from store entrances.  Garden, bike, or walk instead of  watching television or using the computer.  Eat healthy, low-calorie foods and drinks on a regular basis. Eat more fruits and vegetables. Use low-calorie cookbooks or take healthy cooking classes.  Limit fast food, sweets, and processed snack foods.  Eat smaller portions.  Keep a daily journal of everything you eat. There are many free websites to help you with this. It may be helpful to measure your foods so you can determine if you are eating the correct portion sizes.  Avoid drinking alcohol. Drink more water and drinks without calories.  Take vitamins and supplements only as recommended by your caregiver.  Weight-loss support groups, Nurse, mental health, counselors, and stress reduction education can also be very helpful. SEEK IMMEDIATE MEDICAL CARE IF:  You have chest pain or tightness.  You have trouble breathing or feel short of breath.  You have weakness or leg numbness.  You feel confused or have trouble talking.  You have sudden changes in your vision. MAKE SURE YOU:  Understand these instructions.  Will watch your condition.  Will get help right away if you are not doing well or get worse. Document Released: 04/27/2004 Document Revised: 09/19/2011 Document Reviewed: 04/26/2011 [facility] Patient Information 2014 [place].   Smoking Cessation Quitting smoking is important to your health and has many advantages. However, it is not always easy to quit since nicotine is a very addictive drug. Often times, people try 3 times or more before being able to quit. This document explains the best ways for you to prepare to quit smoking. Quitting takes hard work and a lot of effort, but you can do it. ADVANTAGES OF QUITTING SMOKING  You will live longer, feel better, and live better.  Your body will  feel the impact of quitting smoking almost immediately.  Within 20 minutes, blood pressure decreases. Your pulse returns to its normal level.  After 8 hours, carbon  monoxide levels in the blood return to normal. Your oxygen level increases.  After 24 hours, the chance of having a heart attack starts to decrease. Your breath, hair, and body stop smelling like smoke.  After 48 hours, damaged nerve endings begin to recover. Your sense of taste and smell improve.  After 72 hours, the body is virtually free of nicotine. Your bronchial tubes relax and breathing becomes easier.  After 2 to 12 weeks, lungs can hold more air. Exercise becomes easier and circulation improves.  The risk of having a heart attack, stroke, cancer, or lung disease is greatly reduced.  After 1 year, the risk of coronary heart disease is cut in half.  After 5 years, the risk of stroke falls to the same as a nonsmoker.  After 10 years, the risk of lung cancer is cut in half and the risk of other cancers decreases significantly.  After 15 years, the risk of coronary heart disease drops, usually to the level of a nonsmoker.  If you are pregnant, quitting smoking will improve your chances of having a healthy baby.  The people you live with, especially any children, will be healthier.  You will have extra money to spend on things other than cigarettes. QUESTIONS TO THINK ABOUT BEFORE ATTEMPTING TO QUIT You may want to talk about your answers with your caregiver.  Why do you want to quit?  If you tried to quit in the past, what helped and what did not?  What will be the most difficult situations for you after you quit? How will you plan to handle them?  Who can help you through the tough times? Your family? Friends? A caregiver?  What pleasures do you get from smoking? What ways can you still get pleasure if you quit? Here are some questions to ask your caregiver:  How can you help me to be successful at quitting?  What medicine do you think would be best for me and how should I take it?  What should I do if I need more help?  What is smoking withdrawal like? How can I get  information on withdrawal? GET READY  Set a quit date.  Change your environment by getting rid of all cigarettes, ashtrays, matches, and lighters in your home, car, or work. Do not let people smoke in your home.  Review your past attempts to quit. Think about what worked and what did not. GET SUPPORT AND ENCOURAGEMENT You have a better chance of being successful if you have help. You can get support in many ways.  Tell your family, friends, and co-workers that you are going to quit and need their support. Ask them not to smoke around you.  Get individual, group, or telephone counseling and support. Programs are available at [employer] and health centers. Call your local health department for information about programs in your area.  Spiritual beliefs and practices may help some smokers quit.  Download a "quit meter" on your computer to keep track of quit statistics, such as how long you have gone without smoking, cigarettes not smoked, and money saved.  Get a self-help book about quitting smoking and staying off of tobacco. [place] yourself from urges to smoke. Talk to someone, go for a walk, or occupy your time with a task.  Change your normal routine. Take a different route to work. Drink tea instead of coffee. Eat breakfast in a different place.  Reduce your stress. Take a hot bath, exercise, or read a book.  Plan something enjoyable to do every day. Reward yourself for not smoking.  Explore interactive web-based programs that specialize in helping you quit. GET MEDICINE AND USE IT CORRECTLY Medicines can help you stop smoking and decrease the urge to smoke. Combining medicine with the above behavioral methods and support can greatly increase your chances of successfully quitting smoking.  Nicotine replacement therapy helps deliver nicotine to your body without the negative effects and risks of smoking. Nicotine replacement therapy includes  nicotine gum, lozenges, inhalers, nasal sprays, and skin patches. Some may be available over-the-counter and others require a prescription.  Antidepressant medicine helps people abstain from smoking, but how this works is unknown. This medicine is available by prescription.  Nicotinic receptor partial agonist medicine simulates the effect of nicotine in your brain. This medicine is available by prescription. Ask your caregiver for advice about which medicines to use and how to use them based on your health history. Your caregiver will tell you what side effects to look out for if you choose to be on a medicine or therapy. Carefully read the information on the package. Do not use any other product containing nicotine while using a nicotine replacement product.  RELAPSE OR DIFFICULT SITUATIONS Most relapses occur within the first 3 months after quitting. Do not be discouraged if you start smoking again. Remember, most people try several times before finally quitting. You may have symptoms of withdrawal because your body is used to nicotine. You may crave cigarettes, be irritable, feel very hungry, cough often, get headaches, or have difficulty concentrating. The withdrawal symptoms are only temporary. They are strongest when you first quit, but they will go away within 10 14 days. To reduce the chances of relapse, try to:  Avoid drinking alcohol. Drinking lowers your chances of successfully quitting.  Reduce the amount of caffeine you consume. Once you quit smoking, the amount of caffeine in your body increases and can give you symptoms, such as a rapid heartbeat, sweating, and anxiety.  Avoid smokers because they can make you want to smoke.  Do not let weight gain distract you. Many smokers will gain weight when they quit, usually less than 10 pounds. Eat a healthy diet and stay active. You can always lose the weight gained after you quit.  Find ways to improve your mood other than smoking. FOR  MORE INFORMATION  www.smokefree.gov  Document Released: 03/14/2001 Document Revised: 09/19/2011 Document Reviewed: 06/29/2011 [facility] Patient Information 2014 [place], [place].

## 2013-02-25 ENCOUNTER — Ambulatory Visit: Payer: BLUE CROSS/BLUE SHIELD | Admitting: Family Medicine

## 2013-02-26 NOTE — Addendum Note (Signed)
Addended by: Thresa Ross C on: 02/26/2013 11:13 AM   Modules accepted: Orders

## 2013-03-04 ENCOUNTER — Encounter: Payer: Self-pay | Admitting: Family Medicine

## 2013-03-04 ENCOUNTER — Ambulatory Visit (INDEPENDENT_AMBULATORY_CARE_PROVIDER_SITE_OTHER): Payer: Medicare Other | Admitting: Family Medicine

## 2013-03-04 ENCOUNTER — Ambulatory Visit (INDEPENDENT_AMBULATORY_CARE_PROVIDER_SITE_OTHER): Payer: Medicare Other

## 2013-03-04 VITALS — BP 146/91 | HR 94 | Temp 97.6°F | Ht 68.0 in | Wt 279.0 lb

## 2013-03-04 DIAGNOSIS — J441 Chronic obstructive pulmonary disease with (acute) exacerbation: Secondary | ICD-10-CM

## 2013-03-04 DIAGNOSIS — R059 Cough, unspecified: Secondary | ICD-10-CM

## 2013-03-04 DIAGNOSIS — R05 Cough: Secondary | ICD-10-CM

## 2013-03-04 DIAGNOSIS — Z716 Tobacco abuse counseling: Secondary | ICD-10-CM

## 2013-03-04 DIAGNOSIS — R0602 Shortness of breath: Secondary | ICD-10-CM | POA: Diagnosis not present

## 2013-03-04 DIAGNOSIS — F17200 Nicotine dependence, unspecified, uncomplicated: Secondary | ICD-10-CM

## 2013-03-04 DIAGNOSIS — Z7189 Other specified counseling: Secondary | ICD-10-CM

## 2013-03-04 MED ORDER — SPIRONOLACTONE 25 MG PO TABS: 12.5000 mg | ORAL_TABLET | Freq: Every day | ORAL | Status: DC

## 2013-03-04 MED ORDER — LEVOFLOXACIN 500 MG PO TABS: 500.0000 mg | ORAL_TABLET | Freq: Every day | ORAL | Status: DC

## 2013-03-04 MED ORDER — SODIUM CHLORIDE 0.9 % IV SOLN: 125.0000 mg | Freq: Once | INTRAVENOUS | Status: DC

## 2013-03-04 MED ORDER — PREDNISONE 50 MG PO TABS: ORAL_TABLET | ORAL | Status: DC

## 2013-03-04 MED ORDER — METHYLPREDNISOLONE SODIUM SUCC 125 MG IJ SOLR
125.0000 mg | Freq: Once | INTRAMUSCULAR | Status: AC
  Administered 2013-03-04: 125 mg via INTRAMUSCULAR

## 2013-03-04 NOTE — Progress Notes (Signed)
   Subjective:    Patient ID: [name], male    DOB: [birth year], 67 y.o.   MRN: [id number]  HPI Pt presents today with chief complaint of persistent cough  Baseline 1 PPD smoker and COPD Baseline grade 1 diastolic dysfunction.  Obese with OSA  No fevers or chills.  No SOB Faint wheezing  Was seen apporx 6 weeks ago with similar sxs Rxd predpak and doxy Sxs hae minimally improved since this point.  Also states that he ran out of his aldactone 6 weeks ago.  Was seen last month with cards.  Had ECHO that showed grade 1 diastolic dysfxn. EF 60-65%.  No LE edema.  No orthopnea or PND   Review of Systems  All other systems reviewed and are negative.       Objective:   Physical Exam  Vitals reviewed. Constitutional:  Morbidly obese   HENT:  Head: Normocephalic and atraumatic.  Right Ear: External ear normal.  Left Ear: External ear normal.  +nasal erythema, rhinorrhea bilaterally, + post oropharyngeal erythema    Eyes: Conjunctivae are normal. Pupils are equal, round, and reactive to light.  Neck: Normal range of motion. Neck supple.  Cardiovascular: Normal rate and regular rhythm.   Pulmonary/Chest: Effort normal and breath sounds normal.  Abdominal: Soft.  Musculoskeletal: Normal range of motion.  Neurological: He is alert.  Skin: Skin is warm.   [facility] reading (PRIMARY) by  Dr. Ernestina Patches  Preliminary read with mild bronchitic changes with no discernible focal infiltrate                                         Assessment & Plan:  COPD exacerbation - Plan: methylPREDNISolone sodium succinate (SOLU-MEDROL) 130 mg in sodium chloride 0.9 % 50 mL IVPB, predniSONE (DELTASONE) 50 MG tablet, levofloxacin (LEVAQUIN) 500 MG tablet  Cough - Plan: Pro b natriuretic peptide, Comprehensive metabolic panel, Comprehensive metabolic panel  Tobacco abuse counseling  Sxs seem most consistent with mild COPD exacerbation Ddx also includes diastolic dysfunction flare.  Trace  LE edema on exam.  CXR with bronchitic changes without any focal infiltrate preliminarily.  Check pro-BNP as well as Cr  Will treat for COPD exacerbation in the interim (prednisone and levaquin). Restart aldactone.  Discussed infectious and resp red flags at length Follow up in 3-5 days if sxs not improved.

## 2013-03-04 NOTE — Patient Instructions (Signed)
Smoking Cessation Quitting smoking is important to your health and has many advantages. However, it is not always easy to quit since nicotine is a very addictive drug. Often times, people try 3 times or more before being able to quit. This document explains the best ways for you to prepare to quit smoking. Quitting takes hard work and a lot of effort, but you can do it. ADVANTAGES OF QUITTING SMOKING  You will live longer, feel better, and live better.  Your body will feel the impact of quitting smoking almost immediately.  Within 20 minutes, blood pressure decreases. Your pulse returns to its normal level.  After 8 hours, carbon monoxide levels in the blood return to normal. Your oxygen level increases.  After 24 hours, the chance of having a heart attack starts to decrease. Your breath, hair, and body stop smelling like smoke.  After 48 hours, damaged nerve endings begin to recover. Your sense of taste and smell improve.  After 72 hours, the body is virtually free of nicotine. Your bronchial tubes relax and breathing becomes easier.  After 2 to 12 weeks, lungs can hold more air. Exercise becomes easier and circulation improves.  The risk of having a heart attack, stroke, cancer, or lung disease is greatly reduced.  After 1 year, the risk of coronary heart disease is cut in half.  After 5 years, the risk of stroke falls to the same as a nonsmoker.  After 10 years, the risk of lung cancer is cut in half and the risk of other cancers decreases significantly.  After 15 years, the risk of coronary heart disease drops, usually to the level of a nonsmoker.  If you are pregnant, quitting smoking will improve your chances of having a healthy baby.  The people you live with, especially any children, will be healthier.  You will have extra money to spend on things other than cigarettes. QUESTIONS TO THINK ABOUT BEFORE ATTEMPTING TO QUIT You may want to talk about your answers with your  caregiver.  Why do you want to quit?  If you tried to quit in the past, what helped and what did not?  What will be the most difficult situations for you after you quit? How will you plan to handle them?  Who can help you through the tough times? Your family? Friends? A caregiver?  What pleasures do you get from smoking? What ways can you still get pleasure if you quit? Here are some questions to ask your caregiver:  How can you help me to be successful at quitting?  What medicine do you think would be best for me and how should I take it?  What should I do if I need more help?  What is smoking withdrawal like? How can I get information on withdrawal? GET READY  Set a quit date.  Change your environment by getting rid of all cigarettes, ashtrays, matches, and lighters in your home, car, or work. Do not let people smoke in your home.  Review your past attempts to quit. Think about what worked and what did not. GET SUPPORT AND ENCOURAGEMENT You have a better chance of being successful if you have help. You can get support in many ways.  Tell your family, friends, and co-workers that you are going to quit and need their support. Ask them not to smoke around you.  Get individual, group, or telephone counseling and support. Programs are available at local hospitals and health centers. Call your local health department for   information about programs in your area.  Spiritual beliefs and practices may help some smokers quit.  Download a "quit meter" on your computer to keep track of quit statistics, such as how long you have gone without smoking, cigarettes not smoked, and money saved.  Get a self-help book about quitting smoking and staying off of tobacco. LEARN NEW SKILLS AND BEHAVIORS  Distract yourself from urges to smoke. Talk to someone, go for a walk, or occupy your time with a task.  Change your normal routine. Take a different route to work. Drink tea instead of coffee.  Eat breakfast in a different place.  Reduce your stress. Take a hot bath, exercise, or read a book.  Plan something enjoyable to do every day. Reward yourself for not smoking.  Explore interactive web-based programs that specialize in helping you quit. GET MEDICINE AND USE IT CORRECTLY Medicines can help you stop smoking and decrease the urge to smoke. Combining medicine with the above behavioral methods and support can greatly increase your chances of successfully quitting smoking.  Nicotine replacement therapy helps deliver nicotine to your body without the negative effects and risks of smoking. Nicotine replacement therapy includes nicotine gum, lozenges, inhalers, nasal sprays, and skin patches. Some may be available over-the-counter and others require a prescription.  Antidepressant medicine helps people abstain from smoking, but how this works is unknown. This medicine is available by prescription.  Nicotinic receptor partial agonist medicine simulates the effect of nicotine in your brain. This medicine is available by prescription. Ask your caregiver for advice about which medicines to use and how to use them based on your health history. Your caregiver will tell you what side effects to look out for if you choose to be on a medicine or therapy. Carefully read the information on the package. Do not use any other product containing nicotine while using a nicotine replacement product.  RELAPSE OR DIFFICULT SITUATIONS Most relapses occur within the first 3 months after quitting. Do not be discouraged if you start smoking again. Remember, most people try several times before finally quitting. You may have symptoms of withdrawal because your body is used to nicotine. You may crave cigarettes, be irritable, feel very hungry, cough often, get headaches, or have difficulty concentrating. The withdrawal symptoms are only temporary. They are strongest when you first quit, but they will go away within  10 14 days. To reduce the chances of relapse, try to:  Avoid drinking alcohol. Drinking lowers your chances of successfully quitting.  Reduce the amount of caffeine you consume. Once you quit smoking, the amount of caffeine in your body increases and can give you symptoms, such as a rapid heartbeat, sweating, and anxiety.  Avoid smokers because they can make you want to smoke.  Do not let weight gain distract you. Many smokers will gain weight when they quit, usually less than 10 pounds. Eat a healthy diet and stay active. You can always lose the weight gained after you quit.  Find ways to improve your mood other than smoking. FOR MORE INFORMATION  www.smokefree.gov  Document Released: 03/14/2001 Document Revised: 09/19/2011 Document Reviewed: 06/29/2011 [employer] Patient Information 2014 [employer].  

## 2013-03-05 LAB — CMP14+EGFR
ALT: 20 IU/L (ref 0–44)
AST: 16 IU/L (ref 0–40)
Albumin/Globulin Ratio: 1.9 (ref 1.1–2.5)
Albumin: 4.3 g/dL (ref 3.6–4.8)
Alkaline Phosphatase: 77 IU/L (ref 39–117)
BUN/Creatinine Ratio: 11 (ref 10–22)
BUN: 17 mg/dL (ref 8–27)
CO2: 23 mmol/L (ref 18–29)
Calcium: 9.1 mg/dL (ref 8.6–10.2)
Chloride: 103 mmol/L (ref 97–108)
Creatinine, Ser: 1.59 mg/dL — ABNORMAL HIGH (ref 0.76–1.27)
GFR calc Af Amer: 51 mL/min/1.73 — ABNORMAL LOW
GFR calc non Af Amer: 44 mL/min/1.73 — ABNORMAL LOW
Globulin, Total: 2.3 g/dL (ref 1.5–4.5)
Glucose: 109 mg/dL — ABNORMAL HIGH (ref 65–99)
Potassium: 4.1 mmol/L (ref 3.5–5.2)
Sodium: 144 mmol/L (ref 134–144)
Total Bilirubin: 0.5 mg/dL (ref 0.0–1.2)
Total Protein: 6.6 g/dL (ref 6.0–8.5)

## 2013-03-05 LAB — PRO B NATRIURETIC PEPTIDE: NT-Pro BNP: 200 pg/mL — ABNORMAL HIGH (ref 0–124)

## 2013-03-12 DIAGNOSIS — C61 Malignant neoplasm of prostate: Secondary | ICD-10-CM | POA: Diagnosis not present

## 2013-03-21 ENCOUNTER — Ambulatory Visit (INDEPENDENT_AMBULATORY_CARE_PROVIDER_SITE_OTHER): Payer: Medicare Other | Admitting: Urology

## 2013-03-21 DIAGNOSIS — N3941 Urge incontinence: Secondary | ICD-10-CM

## 2013-03-21 DIAGNOSIS — R972 Elevated prostate specific antigen [PSA]: Secondary | ICD-10-CM | POA: Diagnosis not present

## 2013-03-21 DIAGNOSIS — C61 Malignant neoplasm of prostate: Secondary | ICD-10-CM

## 2013-03-24 ENCOUNTER — Telehealth: Payer: Self-pay | Admitting: Nurse Practitioner

## 2013-03-24 ENCOUNTER — Telehealth: Payer: Self-pay | Admitting: *Deleted

## 2013-03-24 ENCOUNTER — Ambulatory Visit (INDEPENDENT_AMBULATORY_CARE_PROVIDER_SITE_OTHER): Payer: Medicare Other | Admitting: Nurse Practitioner

## 2013-03-24 VITALS — BP 176/103 | HR 70 | Temp 96.6°F | Ht 68.0 in | Wt 280.0 lb

## 2013-03-24 DIAGNOSIS — J209 Acute bronchitis, unspecified: Secondary | ICD-10-CM | POA: Diagnosis not present

## 2013-03-24 MED ORDER — HYDROCODONE-HOMATROPINE 5-1.5 MG/5ML PO SYRP: 5.0000 mL | ORAL_SOLUTION | Freq: Three times a day (TID) | ORAL | Status: DC | PRN

## 2013-03-24 MED ORDER — AZITHROMYCIN 250 MG PO TABS: ORAL_TABLET | ORAL | Status: DC

## 2013-03-24 NOTE — Telephone Encounter (Signed)
Wife notified and verbalized understanding. He is better per his wife.

## 2013-03-24 NOTE — Patient Instructions (Signed)

## 2013-03-24 NOTE — Progress Notes (Signed)
   Subjective:    Patient ID: [name], male    DOB: 1945/10/13, 67 y.o.   MRN: [id number]  HPI Patient in today with C/o cough and congestion- has been going on for several weeks and just can't shake it.    Review of Systems  Constitutional: Positive for fever, chills and fatigue.  HENT: Positive for congestion, rhinorrhea and sinus pressure.   Respiratory: Positive for cough (productive).        Objective:   Physical Exam  Constitutional: He is oriented to person, place, and time. He appears well-developed and well-nourished.  Cardiovascular: Normal rate, regular rhythm and normal heart sounds.   Pulmonary/Chest: Effort normal and breath sounds normal. He has no wheezes. He has no rales.  Deep dry cough  Neurological: He is oriented to person, place, and time.  Skin: Skin is warm and dry.  Psychiatric: He has a normal mood and affect. His behavior is normal. Judgment and thought content normal.    BP 176/103  Pulse 70  Temp(Src) 96.6 F (35.9 C) (Oral)  Ht 5\' 8"  (1.727 m)  Wt 280 lb (127.007 kg)  BMI 42.58 kg/m2       Assessment & Plan:   1. Acute bronchitis    Meds ordered this encounter  Medications  . azithromycin (ZITHROMAX Z-PAK) 250 MG tablet    Sig: As directed    Dispense:  6 each    Refill:  0    Order Specific Question:  Supervising Provider    Answer:  Chipper Herb [[id number]]  . HYDROcodone-homatropine (HYCODAN) 5-1.5 MG/5ML syrup    Sig: Take 5 mLs by mouth every 8 (eight) hours as needed for cough.    Dispense:  120 mL    Refill:  0    Order Specific Question:  Supervising Provider    Answer:  RASHUN, MCKEOUGH [[id number]]   1. Take meds as prescribed 2. Use a cool mist humidifier especially during the winter months and when heat has  been humid. 3. Use saline nose sprays frequently 4. Saline irrigations of the nose can be very helpful if done frequently.  * 4X daily for 1 week*  * Use of a nettie pot can be helpful with this. Follow  directions with this* 5. Drink plenty of fluids 6. Keep thermostat turn down low 7.For any cough or congestion  Use plain Mucinex- regular strength or max strength is fine   * Children- consult with Pharmacist for dosing 8. For fever or aces or pains- take tylenol or ibuprofen appropriate for age and weight.  * for fevers greater than 101 orally you may alternate ibuprofen and tylenol every  3 hours.   Mary-Margaret Hassell Done, FNP

## 2013-03-24 NOTE — Telephone Encounter (Signed)
Called to reschedule appt Pt has already left

## 2013-04-11 ENCOUNTER — Ambulatory Visit (INDEPENDENT_AMBULATORY_CARE_PROVIDER_SITE_OTHER): Payer: Medicare Other | Admitting: Family Medicine

## 2013-04-11 ENCOUNTER — Encounter: Payer: Self-pay | Admitting: Family Medicine

## 2013-04-11 VITALS — BP 157/94 | HR 70 | Temp 97.1°F | Ht 68.0 in | Wt 281.0 lb

## 2013-04-11 DIAGNOSIS — K625 Hemorrhage of anus and rectum: Secondary | ICD-10-CM

## 2013-04-11 MED ORDER — HYDROCORTISONE ACETATE 25 MG RE SUPP: 25.0000 mg | Freq: Two times a day (BID) | RECTAL | Status: DC

## 2013-04-11 NOTE — Progress Notes (Signed)
   Subjective:    Patient ID: [name], male    DOB: Jun 30, 1945, 68 y.o.   MRN: [id number]  HPI This 68 y.o. male presents for evaluation of blood in stool.   Review of Systems No chest pain, SOB, HA, dizziness, vision change, N/V, diarrhea, constipation, dysuria, urinary urgency or frequency, myalgias, arthralgias or rash.     Objective:   Physical Exam Vital signs noted  Well developed well nourished male.  HEENT - Head atraumatic Normocephalic                Eyes - PERRLA, Conjuctiva - clear Sclera- Clear EOMI                Ears - EAC's Wnl TM's Wnl Gross Hearing Respiratory - Lungs CTA bilateral Cardiac - RRR S1 and S2 without murmur GI - Abdomen soft Nontender and bowel sounds active x 4 Rectal - Soft mass palpated on left rectum and hemocult stool positive. Extremities - No edema. Neuro - Grossly intact.       Assessment & Plan:  Rectal bleeding - Plan: hydrocortisone (ANUSOL-HC) 25 MG suppository, Ambulatory referral to Gastroenterology.  [name] FNP

## 2013-04-11 NOTE — Patient Instructions (Signed)
Rectal Bleeding Rectal bleeding is when blood passes out of the anus. It is usually a sign that something is wrong. It may not be serious, but it should always be evaluated. Rectal bleeding may present as bright red blood or extremely dark stools. The color may range from dark red or maroon to black (like tar). It is important that the cause of rectal bleeding be identified so treatment can be started and the problem corrected. CAUSES   Hemorrhoids. These are enlarged (dilated) blood vessels or veins in the anal or rectal area.  Fistulas. Theseare abnormal, burrowing channels that usually run from inside the rectum to the skin around the anus. They can bleed.  Anal fissures. This is a tear in the tissue of the anus. Bleeding occurs with bowel movements.  Diverticulosis. This is a condition in which pockets or sacs project from the bowel wall. Occasionally, the sacs can bleed.  Diverticulitis. Thisis an infection involving diverticulosis of the colon.  Proctitis and colitis. These are conditions in which the rectum, colon, or both, can become inflamed and pitted (ulcerated).  Polyps and cancer. Polyps are non-cancerous (benign) growths in the colon that may bleed. Certain types of polyps turn into cancer.  Protrusion of the rectum. Part of the rectum can project from the anus and bleed.  Certain medicines.  Intestinal infections.  Blood vessel abnormalities. HOME CARE INSTRUCTIONS  Eat a high-fiber diet to keep your stool soft.  Limit activity.  Drink enough fluids to keep your urine clear or pale yellow.  Warm baths may be useful to soothe rectal pain.  Follow up with your caregiver as directed. SEEK IMMEDIATE MEDICAL CARE IF:  You develop increased bleeding.  You have black or dark red stools.  You vomit blood or material that looks like coffee grounds.  You have abdominal pain or tenderness.  You have a fever.  You feel weak, nauseous, or you faint.  You have  severe rectal pain or you are unable to have a bowel movement. MAKE SURE YOU:  Understand these instructions.  Will watch your condition.  Will get help right away if you are not doing well or get worse. Document Released: 09/09/2001 Document Revised: 06/12/2011 Document Reviewed: 09/04/2010 Exit[employer] Patient Information 2014 [employer].  

## 2013-04-22 ENCOUNTER — Encounter: Payer: Self-pay | Admitting: Gastroenterology

## 2013-05-05 ENCOUNTER — Ambulatory Visit (INDEPENDENT_AMBULATORY_CARE_PROVIDER_SITE_OTHER): Payer: Medicare Other

## 2013-05-05 ENCOUNTER — Encounter: Payer: Self-pay | Admitting: Family Medicine

## 2013-05-05 ENCOUNTER — Ambulatory Visit (INDEPENDENT_AMBULATORY_CARE_PROVIDER_SITE_OTHER): Payer: Medicare Other | Admitting: Family Medicine

## 2013-05-05 VITALS — BP 140/85 | HR 81 | Temp 97.0°F | Ht 68.0 in | Wt 283.8 lb

## 2013-05-05 DIAGNOSIS — R059 Cough, unspecified: Secondary | ICD-10-CM | POA: Diagnosis not present

## 2013-05-05 DIAGNOSIS — J209 Acute bronchitis, unspecified: Secondary | ICD-10-CM | POA: Diagnosis not present

## 2013-05-05 DIAGNOSIS — R053 Chronic cough: Secondary | ICD-10-CM

## 2013-05-05 DIAGNOSIS — R05 Cough: Secondary | ICD-10-CM | POA: Diagnosis not present

## 2013-05-05 MED ORDER — LEVOFLOXACIN 500 MG PO TABS: 500.0000 mg | ORAL_TABLET | Freq: Every day | ORAL | Status: DC

## 2013-05-05 NOTE — Progress Notes (Signed)
   Subjective:    Patient ID: [name], male    DOB: 04-Aug-1945, 68 y.o.   MRN: [id number]  HPI This 68 y.o. male presents for evaluation of follow up on rectal bleeding.  He has been using anusol hc suppositories and his rectal bleeding has resolved.  He has been having sinus symptoms.   Review of Systems No chest pain, SOB, HA, dizziness, vision change, N/V, diarrhea, constipation, dysuria, urinary urgency or frequency, myalgias, arthralgias or rash.     Objective:   Physical Exam  Vital signs noted  Well developed well nourished male.  HEENT - Head atraumatic Normocephalic                Eyes - PERRLA, Conjuctiva - clear Sclera- Clear EOMI                Ears - EAC's Wnl TM's Wnl Gross Hearing WNL                Nose - Nares patent                 Throat - oropharanx wnl Respiratory - Lungs CTA bilateral Cardiac - RRR S1 and S2 without murmur GI - Abdomen soft Nontender and bowel sounds active x 4 Extremities - No edema. Neuro - Grossly intact.      Assessment & Plan:  Persistent cough - Plan: DG Chest 2 View, levofloxacin (LEVAQUIN) 500 MG tablet  Acute bronchitis - Plan: levofloxacin (LEVAQUIN) 500 MG tablet  Rectal bleeding - Resolved.  Recommend he get colonoscopy and he wants to hold off right now with GI referral.    [name] FNP

## 2013-05-12 ENCOUNTER — Ambulatory Visit: Payer: Medicare Other | Admitting: Gastroenterology

## 2013-05-28 DIAGNOSIS — C61 Malignant neoplasm of prostate: Secondary | ICD-10-CM | POA: Diagnosis not present

## 2013-06-03 ENCOUNTER — Other Ambulatory Visit: Payer: Self-pay | Admitting: Family Medicine

## 2013-06-04 NOTE — Telephone Encounter (Signed)
Patient no longer under prescribed care.

## 2013-06-06 ENCOUNTER — Other Ambulatory Visit: Payer: Self-pay | Admitting: Urology

## 2013-06-06 ENCOUNTER — Ambulatory Visit (INDEPENDENT_AMBULATORY_CARE_PROVIDER_SITE_OTHER): Payer: Medicare Other | Admitting: Urology

## 2013-06-06 DIAGNOSIS — C61 Malignant neoplasm of prostate: Secondary | ICD-10-CM

## 2013-06-06 DIAGNOSIS — N3941 Urge incontinence: Secondary | ICD-10-CM | POA: Diagnosis not present

## 2013-06-10 ENCOUNTER — Telehealth: Payer: Self-pay | Admitting: Family Medicine

## 2013-06-10 ENCOUNTER — Other Ambulatory Visit: Payer: Self-pay | Admitting: Family Medicine

## 2013-06-10 ENCOUNTER — Encounter (HOSPITAL_COMMUNITY)
Admission: RE | Admit: 2013-06-10 | Discharge: 2013-06-10 | Disposition: A | Discharge status: Home / Self Care | Payer: Medicare Other | Source: Ambulatory Visit | Attending: Urology | Admitting: Urology

## 2013-06-10 ENCOUNTER — Encounter (HOSPITAL_COMMUNITY): Payer: Self-pay

## 2013-06-10 DIAGNOSIS — C61 Malignant neoplasm of prostate: Secondary | ICD-10-CM | POA: Diagnosis not present

## 2013-06-10 MED ORDER — TECHNETIUM TC 99M MEDRONATE IV KIT
25.0000 | PACK | Freq: Once | INTRAVENOUS | Status: AC | PRN
  Administered 2013-06-10: 25 via INTRAVENOUS

## 2013-06-11 ENCOUNTER — Ambulatory Visit (HOSPITAL_COMMUNITY)
Admission: RE | Admit: 2013-06-11 | Discharge: 2013-06-11 | Disposition: A | Discharge status: Home / Self Care | Payer: Medicare Other | Source: Ambulatory Visit | Attending: Urology | Admitting: Urology

## 2013-06-11 DIAGNOSIS — N281 Cyst of kidney, acquired: Secondary | ICD-10-CM | POA: Insufficient documentation

## 2013-06-11 DIAGNOSIS — R109 Unspecified abdominal pain: Secondary | ICD-10-CM | POA: Insufficient documentation

## 2013-06-11 DIAGNOSIS — K7689 Other specified diseases of liver: Secondary | ICD-10-CM | POA: Diagnosis not present

## 2013-06-11 DIAGNOSIS — C61 Malignant neoplasm of prostate: Secondary | ICD-10-CM

## 2013-06-11 LAB — POCT I-STAT CREATININE: Creatinine, Ser: 1.6 mg/dL — ABNORMAL HIGH (ref 0.50–1.35)

## 2013-06-11 MED ORDER — IOHEXOL 300 MG/ML  SOLN
100.0000 mL | Freq: Once | INTRAMUSCULAR | Status: AC | PRN
  Administered 2013-06-11: 100 mL via INTRAVENOUS

## 2013-06-16 ENCOUNTER — Ambulatory Visit: Payer: Medicare Other | Admitting: Gastroenterology

## 2013-06-16 ENCOUNTER — Other Ambulatory Visit: Payer: Self-pay | Admitting: *Deleted

## 2013-06-16 MED ORDER — PANTOPRAZOLE SODIUM 40 MG PO TBEC: 40.0000 mg | DELAYED_RELEASE_TABLET | Freq: Two times a day (BID) | ORAL | Status: DC

## 2013-06-16 MED ORDER — CLONIDINE HCL 0.1 MG PO TABS: 0.2000 mg | ORAL_TABLET | Freq: Two times a day (BID) | ORAL | Status: DC

## 2013-06-16 NOTE — Telephone Encounter (Signed)
Call patient : Prescription refilled & sent to pharmacy in [place]. 

## 2013-06-16 NOTE — Telephone Encounter (Signed)
Refills routed to Dr. Jacelyn Grip to review.

## 2013-06-16 NOTE — Telephone Encounter (Signed)
Pt comes to [facility] but rx was faxed to [facility] and they refused med. Can you approve please. thanks

## 2013-06-17 ENCOUNTER — Other Ambulatory Visit: Payer: Self-pay | Admitting: Family Medicine

## 2013-06-18 DIAGNOSIS — Z85828 Personal history of other malignant neoplasm of skin: Secondary | ICD-10-CM | POA: Diagnosis not present

## 2013-06-18 DIAGNOSIS — Z886 Allergy status to analgesic agent status: Secondary | ICD-10-CM | POA: Diagnosis not present

## 2013-06-18 DIAGNOSIS — I10 Essential (primary) hypertension: Secondary | ICD-10-CM | POA: Diagnosis not present

## 2013-06-18 DIAGNOSIS — Z882 Allergy status to sulfonamides status: Secondary | ICD-10-CM | POA: Diagnosis not present

## 2013-06-18 DIAGNOSIS — Z79899 Other long term (current) drug therapy: Secondary | ICD-10-CM | POA: Diagnosis not present

## 2013-06-18 DIAGNOSIS — E7800 Pure hypercholesterolemia, unspecified: Secondary | ICD-10-CM | POA: Diagnosis not present

## 2013-06-18 DIAGNOSIS — M109 Gout, unspecified: Secondary | ICD-10-CM | POA: Diagnosis not present

## 2013-06-18 DIAGNOSIS — Z87891 Personal history of nicotine dependence: Secondary | ICD-10-CM | POA: Diagnosis not present

## 2013-06-18 DIAGNOSIS — Z8051 Family history of malignant neoplasm of kidney: Secondary | ICD-10-CM | POA: Diagnosis not present

## 2013-06-18 DIAGNOSIS — C61 Malignant neoplasm of prostate: Secondary | ICD-10-CM | POA: Diagnosis not present

## 2013-06-19 NOTE — Telephone Encounter (Signed)
Last seen on 05-05-12 and was rxd levaquin. Please advise on this refill

## 2013-06-25 ENCOUNTER — Telehealth: Payer: Self-pay | Admitting: Family Medicine

## 2013-06-25 ENCOUNTER — Other Ambulatory Visit: Payer: Self-pay | Admitting: Family Medicine

## 2013-06-25 NOTE — Telephone Encounter (Signed)
appt made for Monday, pt wants ref to [facility]

## 2013-06-30 ENCOUNTER — Ambulatory Visit (INDEPENDENT_AMBULATORY_CARE_PROVIDER_SITE_OTHER): Payer: Medicare Other | Admitting: Family Medicine

## 2013-06-30 ENCOUNTER — Encounter: Payer: Self-pay | Admitting: Family Medicine

## 2013-06-30 VITALS — BP 155/90 | HR 72 | Temp 97.2°F | Ht 68.0 in | Wt 279.0 lb

## 2013-06-30 DIAGNOSIS — J449 Chronic obstructive pulmonary disease, unspecified: Secondary | ICD-10-CM | POA: Diagnosis not present

## 2013-06-30 DIAGNOSIS — N182 Chronic kidney disease, stage 2 (mild): Secondary | ICD-10-CM | POA: Diagnosis not present

## 2013-06-30 DIAGNOSIS — C61 Malignant neoplasm of prostate: Secondary | ICD-10-CM | POA: Diagnosis not present

## 2013-06-30 DIAGNOSIS — I2510 Atherosclerotic heart disease of native coronary artery without angina pectoris: Secondary | ICD-10-CM

## 2013-06-30 NOTE — Progress Notes (Signed)
Patient ID: [name], male   DOB: 1945-04-27, 68 y.o.   MRN: [id number] SUBJECTIVE: CC: Chief Complaint  Patient presents with  . Follow-up    wants to discuss visit from oncololgist in [place] --wants referral to [facility]     HPI: Has prostate cancer being treated by Dr [name]. He was referred to the oncologist  In [place] but he prefers to go to the [place] center. Other medical problems are stable.   Past Medical History  Diagnosis Date  . Coronary atherosclerosis of native coronary artery     Mild atherosclerosis at catheterization 2011  . Gout   . Nephrolithiasis   . GERD (gastroesophageal reflux disease)   . Essential hypertension, benign   . Sleep apnea     Intolerant of CPAP  . Aortic atherosclerosis     Penetrating aortic ulcer s/p endograft 2004 - followed by Dr. Trula Slade  . Prostate cancer   . COPD (chronic obstructive pulmonary disease)     "collapsed left lung" years ago also  . Tubular adenoma of colon 08/2008   Past Surgical History  Procedure Laterality Date  . Aortic endograft repair  2004    Dr. [name]  . Colonoscopy w/ polypectomy    . Umbilical hernia repair    . Fracture surgery      Bilateral lower arms as a child  . Knee arthroscopy      Left  . Prostate biopsy  09/26/2011    Procedure: BIOPSY TRANSRECTAL ULTRASONIC PROSTATE (TUBP);  Surgeon: [name], MD;  Location: [facility];  Service: Urology;  Laterality: N/A;     . Circumcision  09/26/2011    Procedure: CIRCUMCISION ADULT;  Surgeon: [name], MD;  Location: [facility];  Service: Urology;  Laterality: N/A;  . Cystoscopy  09/26/2011    Procedure: CYSTOSCOPY;  Surgeon: [name], MD;  Location: [facility];  Service: Urology;  Laterality: N/A;  . Knee surgery  Sept. 26, 2013    Torn minisc.- Right  knee  . Evar    . Endovascular stent insertion  September 13, 2007    EVAR - Aortic stent graft   History   Social History  . Marital Status: Married    Spouse Name: N/A    Number of  Children: N/A  . Years of Education: N/A   Occupational History  . Not on file.   Social History Main Topics  . Smoking status: Current Every Day Smoker -- 0.50 packs/day for 42 years    Types: Cigarettes  . Smokeless tobacco: Never Used  . Alcohol Use: No  . Drug Use: No  . Sexual Activity: Not on file   Other Topics Concern  . Not on file   Social History Narrative  . No narrative on file   Family History  Problem Relation Age of Onset  . Stroke Mother   . Mesothelioma Father   . Cancer Father   . Hyperlipidemia Sister   . Heart attack Sister   . Cancer Brother 61  . Heart disease Brother   . Hyperlipidemia Brother    Current Outpatient Prescriptions on File Prior to Visit  Medication Sig Dispense Refill  . Aclidinium Bromide (TUDORZA PRESSAIR) 400 MCG/ACT AEPB Inhale 1 puff into the lungs 2 (two) times daily.  2 each  0  . allopurinol (ZYLOPRIM) 300 MG tablet TAKE ONE TABLET BY MOUTH ONCE DAILY  30 tablet  1  . amLODipine (NORVASC) 10 MG tablet TAKE ONE TABLET  BY MOUTH ONCE DAILY  30 tablet  4  . carvedilol (COREG) 25 MG tablet Take 1 tablet (25 mg total) by mouth 2 (two) times daily.  30 tablet  6  . cloNIDine (CATAPRES) 0.1 MG tablet Take 2 tablets (0.2 mg total) by mouth 2 (two) times daily.  360 tablet  1  . diclofenac sodium (VOLTAREN) 1 % GEL Apply 2 g topically 4 (four) times daily.  100 g  2  . Fluticasone Furoate-Vilanterol (BREO ELLIPTA) 100-25 MCG/INH AEPB Inhale 1 puff into the lungs daily.  2 each  o  . hydrocortisone (ANUSOL-HC) 25 MG suppository Place 1 suppository (25 mg total) rectally 2 (two) times daily.  12 suppository  0  . levofloxacin (LEVAQUIN) 500 MG tablet TAKE ONE TABLET BY MOUTH ONCE DAILY  14 tablet  0  . losartan (COZAAR) 50 MG tablet Take 1 tablet (50 mg total) by mouth daily.  30 tablet  11  . meloxicam (MOBIC) 15 MG tablet       . oxybutynin (DITROPAN) 5 MG tablet       . pantoprazole (PROTONIX) 40 MG tablet Take 1 tablet (40 mg total)  by mouth 2 (two) times daily.  60 tablet  3  . pravastatin (PRAVACHOL) 80 MG tablet Take 1 tablet (80 mg total) by mouth every evening.  30 tablet  6  . spironolactone (ALDACTONE) 25 MG tablet Take 0.5 tablets (12.5 mg total) by mouth daily.  15 tablet  2   No current facility-administered medications on file prior to visit.   Allergies  Allergen Reactions  . Carbidopa-Levodopa     hallunications  . Morphine Sulfate Nausea And Vomiting  . Sulfacetamide Sodium Nausea And Vomiting  . Sulfamethoxazole-Trimethoprim Nausea And Vomiting   Immunization History  Administered Date(s) Administered  . Influenza Whole 01/31/2007, 01/20/2008  . Influenza,inj,Quad PF,36+ Mos 02/03/2013   Prior to Admission medications   Medication Sig Start Date End Date Taking? Authorizing Provider  Aclidinium Bromide (TUDORZA PRESSAIR) 400 MCG/ACT AEPB Inhale 1 puff into the lungs 2 (two) times daily. 02/03/13   Vernie Shanks, MD  allopurinol (ZYLOPRIM) 300 MG tablet TAKE ONE TABLET BY MOUTH ONCE DAILY    Vernie Shanks, MD  amLODipine (NORVASC) 10 MG tablet TAKE ONE TABLET BY MOUTH ONCE DAILY    [name], FNP  carvedilol (COREG) 25 MG tablet Take 1 tablet (25 mg total) by mouth 2 (two) times daily. 02/12/13   Peter M Martinique, MD  cloNIDine (CATAPRES) 0.1 MG tablet Take 2 tablets (0.2 mg total) by mouth 2 (two) times daily. 06/16/13   Vernie Shanks, MD  diclofenac sodium (VOLTAREN) 1 % GEL Apply 2 g topically 4 (four) times daily. 01/14/13   Vernie Shanks, MD  Fluticasone Furoate-Vilanterol (BREO ELLIPTA) 100-25 MCG/INH AEPB Inhale 1 puff into the lungs daily. 02/03/13   Vernie Shanks, MD  hydrocortisone (ANUSOL-HC) 25 MG suppository Place 1 suppository (25 mg total) rectally 2 (two) times daily. 04/11/13   [name], FNP  levofloxacin (LEVAQUIN) 500 MG tablet TAKE ONE TABLET BY MOUTH ONCE DAILY    [name], FNP  losartan (COZAAR) 50 MG tablet Take 1 tablet (50 mg total) by mouth daily.  01/28/13   [name], FNP  meloxicam [facility]) 15 MG tablet  12/09/12   Historical Provider, MD  oxybutynin (DITROPAN) 5 MG tablet  04/22/13   Historical Provider, MD  pantoprazole (PROTONIX) 40 MG tablet Take 1 tablet (40 mg total)  by mouth 2 (two) times daily. 06/16/13   Vernie Shanks, MD  pravastatin (PRAVACHOL) 80 MG tablet Take 1 tablet (80 mg total) by mouth every evening. 10/18/12   Vernie Shanks, MD  spironolactone (ALDACTONE) 25 MG tablet Take 0.5 tablets (12.5 mg total) by mouth daily. 03/04/13   Shanda Howells, MD     ROS: As above in the HPI. All other systems are stable or negative.  OBJECTIVE: APPEARANCE:  Patient in no acute distress.The patient appeared well nourished and normally developed. Acyanotic. Waist: VITAL SIGNS:BP 155/90  Pulse 72  Temp(Src) 97.2 F (36.2 C) (Oral)  Ht 5\' 8"  (1.727 m)  Wt 279 lb (126.554 kg)  BMI 42.43 kg/m2 Morbidly obese WM  SKIN: warm and  Dry without overt rashes, tattoos and scars  HEAD and Neck: without JVD, Head and scalp: normal Eyes:No scleral icterus. Fundi normal, eye movements normal. Ears: Auricle normal, canal normal, Tympanic membranes normal, insufflation normal. Nose: normal Throat: normal Neck & thyroid: normal  CHEST & LUNGS: Chest wall: normal Lungs: Clear  CVS: Reveals the PMI to be normally located. Regular rhythm, First and Second Heart sounds are normal,  absence of murmurs, rubs or gallops. Peripheral vasculature: Radial pulses: normal Dorsal pedis pulses: normal Posterior pulses: normal  ABDOMEN:  Appearance: morbidly obese Benign, no organomegaly, no masses, no Abdominal Aortic enlargement. No Guarding , no rebound. No Bruits. Bowel sounds: normal  RECTAL: N/A GU: N/A  EXTREMETIES: nonedematous.  MUSCULOSKELETAL:  Spine: normal Joints: intact  NEUROLOGIC: oriented to time,place and person; nonfocal.  ASSESSMENT:  Prostate cancer - Plan: Ambulatory referral to  Hematology  Coronary atherosclerosis of native coronary artery  COPD (chronic obstructive pulmonary disease)  CHRONIC KIDNEY DISEASE STAGE II (MILD)  PLAN: Will expedite his request for referral to cancer center at [facility].  Orders Placed This Encounter  Procedures  . Ambulatory referral to Hematology    Referral Priority:  Routine    Referral Type:  Consultation    Referral Reason:  Specialty Services Required    Requested Specialty:  Oncology    Number of Visits Requested:  1   Meds ordered this encounter  Medications  . bicalutamide (CASODEX) 50 MG tablet    Sig:    There are no discontinued medications. Return if symptoms worsen or fail to improve.  Jester Klingberg P. Jacelyn Grip, M.D.

## 2013-07-02 ENCOUNTER — Telehealth: Payer: Self-pay | Admitting: Oncology

## 2013-07-02 ENCOUNTER — Other Ambulatory Visit: Payer: Self-pay | Admitting: Family Medicine

## 2013-07-02 NOTE — Telephone Encounter (Signed)
C/D 07/02/13 for appt. 07/09/13

## 2013-07-02 NOTE — Telephone Encounter (Signed)
S/W PATIENT AND GAVE NEW PATIENT APPT FOR 04/08 @ 10:30 W/DR. SHADAD.  REFERRING DR. Rolla PACKET MAILED.

## 2013-07-07 ENCOUNTER — Other Ambulatory Visit: Payer: Self-pay | Admitting: Oncology

## 2013-07-07 DIAGNOSIS — C61 Malignant neoplasm of prostate: Secondary | ICD-10-CM

## 2013-07-08 ENCOUNTER — Telehealth: Payer: Self-pay | Admitting: Medical Oncology

## 2013-07-08 DIAGNOSIS — C61 Malignant neoplasm of prostate: Secondary | ICD-10-CM | POA: Diagnosis not present

## 2013-07-08 NOTE — Telephone Encounter (Signed)
Call to patient to confirm appt. LVMOM with patient for confirm appt with MD tomorrow, as well as asking patient to bring in a list of his current medications and to inform him of our free [place] parking. Asked patient to return call to office for confirmation.

## 2013-07-09 ENCOUNTER — Telehealth: Payer: Self-pay | Admitting: Oncology

## 2013-07-09 ENCOUNTER — Ambulatory Visit: Payer: Medicare Other

## 2013-07-09 ENCOUNTER — Other Ambulatory Visit (HOSPITAL_BASED_OUTPATIENT_CLINIC_OR_DEPARTMENT_OTHER): Payer: Medicare Other

## 2013-07-09 ENCOUNTER — Encounter: Payer: Self-pay | Admitting: Oncology

## 2013-07-09 ENCOUNTER — Ambulatory Visit (HOSPITAL_BASED_OUTPATIENT_CLINIC_OR_DEPARTMENT_OTHER): Payer: Medicare Other | Admitting: Oncology

## 2013-07-09 VITALS — BP 158/99 | HR 70 | Temp 97.6°F | Resp 20 | Ht 68.0 in | Wt 283.5 lb

## 2013-07-09 DIAGNOSIS — C61 Malignant neoplasm of prostate: Secondary | ICD-10-CM | POA: Diagnosis not present

## 2013-07-09 DIAGNOSIS — C775 Secondary and unspecified malignant neoplasm of intrapelvic lymph nodes: Secondary | ICD-10-CM

## 2013-07-09 LAB — CBC WITH DIFFERENTIAL/PLATELET
BASO%: 0.7 % (ref 0.0–2.0)
Basophils Absolute: 0.1 10*3/uL (ref 0.0–0.1)
EOS%: 7.2 % — ABNORMAL HIGH (ref 0.0–7.0)
Eosinophils Absolute: 0.5 10*3/uL (ref 0.0–0.5)
HCT: 41 % (ref 38.4–49.9)
HGB: 13.8 g/dL (ref 13.0–17.1)
LYMPH%: 18.1 % (ref 14.0–49.0)
MCH: 29.8 pg (ref 27.2–33.4)
MCHC: 33.6 g/dL (ref 32.0–36.0)
MCV: 88.5 fL (ref 79.3–98.0)
MONO#: 0.5 10*3/uL (ref 0.1–0.9)
MONO%: 6.4 % (ref 0.0–14.0)
NEUT#: 4.8 10*3/uL (ref 1.5–6.5)
NEUT%: 67.6 % (ref 39.0–75.0)
Platelets: 235 10*3/uL (ref 140–400)
RBC: 4.63 10*6/uL (ref 4.20–5.82)
RDW: 14.7 % — ABNORMAL HIGH (ref 11.0–14.6)
WBC: 7.2 10*3/uL (ref 4.0–10.3)
lymph#: 1.3 10*3/uL (ref 0.9–3.3)

## 2013-07-09 LAB — COMPREHENSIVE METABOLIC PANEL (CC13)
ALT: 18 U/L (ref 0–55)
AST: 18 U/L (ref 5–34)
Albumin: 4 g/dL (ref 3.5–5.0)
Alkaline Phosphatase: 75 U/L (ref 40–150)
Anion Gap: 8 mEq/L (ref 3–11)
BUN: 14.6 mg/dL (ref 7.0–26.0)
CO2: 23 mEq/L (ref 22–29)
Calcium: 9.2 mg/dL (ref 8.4–10.4)
Chloride: 111 mEq/L — ABNORMAL HIGH (ref 98–109)
Creatinine: 1.4 mg/dL — ABNORMAL HIGH (ref 0.7–1.3)
Glucose: 106 mg/dl (ref 70–140)
Potassium: 3.8 mEq/L (ref 3.5–5.1)
Sodium: 143 mEq/L (ref 136–145)
Total Bilirubin: 0.41 mg/dL (ref 0.20–1.20)
Total Protein: 7.2 g/dL (ref 6.4–8.3)

## 2013-07-09 NOTE — Progress Notes (Signed)
Checked in new pt with no financial concerns. °

## 2013-07-09 NOTE — Progress Notes (Signed)
Please see consult note.  

## 2013-07-09 NOTE — Telephone Encounter (Signed)
gv adn printed aptp sched and avs for pt for July °

## 2013-07-09 NOTE — Consult Note (Signed)
Reason for Referral: Prostate cancer.   HPI: 68 year old gentleman native of the state of [place] but lived in [place] for the majority of his life. Currently lives in [place] where he is retired. He is a gentleman of with a past medical history significant for coronary artery disease and aortic atherosclerosis. He presented with an elevated PSA of 11.2 in June of 2013 as well as an indurated right prostate on digital rectal examination. He was evaluated by Dr. [name] and underwent a prostate biopsy on 09/26/2011 and found to have a Gleason score 4+4 equals 8. His staging workup did not reveal any bony metastasis but he did have a 2.2 x 3.7 cm enlarged right external iliac lymph node. He was treated with androgen depravation only with Lupron and his PSA nadired at around 0.12 in February of 2014. Subsequently had a slow rise in his PSA to 0.18 in August of 2014. To 0.32 in October 2014 and in December of 2014 was up to 0.30 and in February of 2015 his PSA was up to 0.66. He had a staging workup done in March of 2015 including a CT scan of the bone scan and did not show any evidence of metastatic disease. He was started on Casodex by Dr. [name] in the last few weeks. He was also evaluated by radiation oncology for possible salvage radiation therapy and was referred to me for another opinion regarding his prostate cancer care. Clinically, he is relatively asymptomatic. He did not report any back pain shoulder pain or hip pain. He does not report any weight loss or appetite changes. He does report some nocturia but no hematuria or dysuria. He does not report any painful defecation or pelvic discomfort. Skin day to have a reasonable performance status but does struggle with obesity and knee pain. He had not reported any recent hospitalizations or illnesses. Continue to work a few hours at [employer] and he performs activities of daily living without any hindrance or decline.   Past Medical  History  Diagnosis Date  . Coronary atherosclerosis of native coronary artery     Mild atherosclerosis at catheterization 2011  . Gout   . Nephrolithiasis   . GERD (gastroesophageal reflux disease)   . Essential hypertension, benign   . Sleep apnea     Intolerant of CPAP  . Aortic atherosclerosis     Penetrating aortic ulcer s/p endograft 2004 - followed by Dr. Trula Slade  . Prostate cancer   . COPD (chronic obstructive pulmonary disease)     "collapsed left lung" years ago also  . Tubular adenoma of colon 08/2008  :  Past Surgical History  Procedure Laterality Date  . Aortic endograft repair  2004    Dr. [name]  . Colonoscopy w/ polypectomy    . Umbilical hernia repair    . Fracture surgery      Bilateral lower arms as a child  . Knee arthroscopy      Left  . Prostate biopsy  09/26/2011    Procedure: BIOPSY TRANSRECTAL ULTRASONIC PROSTATE (TUBP);  Surgeon: [name], MD;  Location: [facility];  Service: Urology;  Laterality: N/A;     . Circumcision  09/26/2011    Procedure: CIRCUMCISION ADULT;  Surgeon: [name], MD;  Location: [facility];  Service: Urology;  Laterality: N/A;  . Cystoscopy  09/26/2011    Procedure: CYSTOSCOPY;  Surgeon: [name], MD;  Location: [facility];  Service: Urology;  Laterality: N/A;  . Knee surgery  Sept.  26, 2013    Torn minisc.- Right  knee  . Evar    . Endovascular stent insertion  September 13, 2007    EVAR - Aortic stent graft  :  Current Outpatient Prescriptions  Medication Sig Dispense Refill  . allopurinol (ZYLOPRIM) 300 MG tablet TAKE ONE TABLET BY MOUTH ONCE DAILY  30 tablet  1  . amLODipine (NORVASC) 10 MG tablet TAKE ONE TABLET BY MOUTH ONCE DAILY  30 tablet  4  . bicalutamide (CASODEX) 50 MG tablet Take 50 mg by mouth daily.       . carvedilol (COREG) 25 MG tablet Take 1 tablet (25 mg total) by mouth 2 (two) times daily.  30 tablet  6  . cloNIDine (CATAPRES) 0.1 MG tablet Take 2 tablets (0.2 mg total) by mouth 2 (two) times daily.  360 tablet   1  . losartan (COZAAR) 50 MG tablet Take 1 tablet (50 mg total) by mouth daily.  30 tablet  11  . oxybutynin (DITROPAN) 5 MG tablet Take 5 mg by mouth daily.       . pantoprazole (PROTONIX) 40 MG tablet Take 1 tablet (40 mg total) by mouth 2 (two) times daily.  60 tablet  3  . pravastatin (PRAVACHOL) 80 MG tablet TAKE ONE TABLET BY MOUTH ONCE DAILY IN THE EVENING  30 tablet  2  . spironolactone (ALDACTONE) 25 MG tablet Take 0.5 tablets (12.5 mg total) by mouth daily.  15 tablet  2   No current facility-administered medications for this visit.       Allergies  Allergen Reactions  . Carbidopa-Levodopa     hallunications  . Morphine Sulfate Nausea And Vomiting  . Sulfacetamide Sodium Nausea And Vomiting  . Sulfamethoxazole-Trimethoprim Nausea And Vomiting  :  Family History  Problem Relation Age of Onset  . Stroke Mother   . Mesothelioma Father   . Cancer Father   . Hyperlipidemia Sister   . Heart attack Sister   . Cancer Brother 19  . Heart disease Brother   . Hyperlipidemia Brother   :  History   Social History  . Marital Status: Married    Spouse Name: N/A    Number of Children: N/A  . Years of Education: N/A   Occupational History  . Not on file.   Social History Main Topics  . Smoking status: Current Every Day Smoker -- 0.50 packs/day for 42 years    Types: Cigarettes  . Smokeless tobacco: Never Used  . Alcohol Use: No  . Drug Use: No  . Sexual Activity: Not on file   Other Topics Concern  . Not on file   Social History Narrative  . No narrative on file  :  Constitutional: negative for chills, fevers and weight loss Eyes: negative for icterus and irritation Ears, nose, mouth, throat, and face: negative for epistaxis, sore mouth and tinnitus Respiratory: negative for cough, dyspnea on exertion and wheezing Cardiovascular: negative for chest pain, exertional chest pressure/discomfort and tachypnea Gastrointestinal: negative for abdominal pain, melena  and vomiting Genitourinary:negative for frequency, hematuria and nocturia Integument/breast: negative for pruritus and rash Hematologic/lymphatic: negative for bleeding, easy bruising and lymphadenopathy Musculoskeletal:negative for arthralgias and stiff joints Neurological: negative for coordination problems, dizziness and weakness Behavioral/Psych: negative for bad mood and irritability Endocrine: negative for temperature intolerance  Exam: Blood pressure 158/99, pulse 70, temperature 97.6 F (36.4 C), temperature source Oral, resp. rate 20, height 5\' 8"  (1.727 m), weight 283 lb 8 oz (128.595 kg), SpO2 99.00%.  General appearance: alert, cooperative and appears stated age Throat: lips, mucosa, and tongue normal; teeth and gums normal Neck: no adenopathy, no carotid bruit, no JVD, supple, symmetrical, trachea midline and thyroid not enlarged, symmetric, no tenderness/mass/nodules Back: negative Resp: clear to auscultation bilaterally Chest wall: no tenderness GI: soft, non-tender; bowel sounds normal; no masses,  no organomegaly Extremities: extremities normal, atraumatic, no cyanosis or edema Pulses: 2+ and symmetric Skin: Skin color, texture, turgor normal. No rashes or lesions Lymph nodes: Cervical, supraclavicular, and axillary nodes normal. Neurologic: Grossly normal   Recent Labs  07/09/13 1028  WBC 7.2  HGB 13.8  HCT 41.0  PLT 235    Recent Labs  07/09/13 1029  NA 143  K 3.8  CO2 23  GLUCOSE 106  BUN 14.6  CREATININE 1.4*  CALCIUM 9.2     Nm Bone Scan Whole Body  06/10/2013   CLINICAL DATA Prostate cancer, bilateral knee pain  EXAM NUCLEAR MEDICINE WHOLE BODY BONE SCAN  TECHNIQUE Whole body anterior and posterior images were obtained approximately 3 hours after intravenous injection of radiopharmaceutical.  RADIOPHARMACEUTICALS 25 mCi Technetium-57m MDP  COMPARISON 10/06/2011  Radiographic correlation:  Chest radiograph 05/05/2013  FINDINGS Minimal uptake and  the knees greater on right an shoulders, to degenerative.  Minimal uptake at right sixth and tenth costovertebral junctions, typically degenerative, stable.  New uptake in mid to distal right foot at fourth or fifth metatarsal region, typically posttraumatic or degenerative.  No definite sites of abnormal osseous tracer accumulation are identified which are suspicious for osseous metastatic disease.  IMPRESSION No definite scintigraphic evidence of osseous metastatic disease.  SIGNATURE  Electronically Signed   By: Lavonia Dana M.D.   On: 06/10/2013 15:16   Ct Abdomen Pelvis W Contrast  06/11/2013   CLINICAL DATA Prostate cancer.  Abdominal pain.  EXAM CT ABDOMEN AND PELVIS WITH CONTRAST  TECHNIQUE Multidetector CT imaging of the abdomen and pelvis was performed using the standard protocol following bolus administration of intravenous contrast.  CONTRAST 171mL OMNIPAQUE IOHEXOL 300 MG/ML  SOLN  COMPARISON CT of the abdomen and pelvis 07/30/2012.  FINDINGS Lung Bases: Small calcified granuloma in the medial aspect of the right lower lobe. Calcifications of the aortic root and mitral annulus.  Abdomen/Pelvis: 1.8 x 1.4 cm well-defined low-attenuation lesion in segment 6 of the liver is compatible with a small simple cyst. Diffuse low attenuation throughout the hepatic parenchyma, compatible with a background of hepatic steatosis. The appearance of the gallbladder, pancreas and spleen is unremarkable. Mild fatty thickening of the adrenal glands bilaterally, suggestive of adenomatous hyperplasia. Numerous low-attenuation lesions in the kidneys bilaterally, the majority of which are too small to definitively characterize. The largest of these lesions are all compatible with simple cysts, with the largest lesion measuring up to 3.9 cm in diameter extending exophytically off the lower pole of the left kidney.  Atherosclerosis throughout the abdominal and pelvic vasculature. Status post aorto bi-iliac stent graft  placement. The stent graft is grossly patent at this time. No evidence of recurrent abdominal aortic aneurysm. No significant volume of ascites. No pneumoperitoneum. No pathologic distention of small bowel. Normal appendix. No definite lymphadenopathy identified within the abdomen or pelvis. Prostate gland and urinary bladder are unremarkable in appearance.  Musculoskeletal: There are no aggressive appearing lytic or blastic lesions noted in the visualized portions of the skeleton.  IMPRESSION 1. No acute findings in the abdomen or pelvis to account for the patient's history of abdominal pain. 2. No definite evidence of  metastatic disease in the abdomen or pelvis. 3. Hepatic steatosis. 4. Simple cysts in the left kidney and segment 6 of the liver. In addition, there are several sub cm low-attenuation lesions in the kidneys bilaterally which are too small to definitively characterize, but are similar to prior studies, favored to represent tiny cysts. 5. Normal appendix. 6. Status post aorto bi-iliac bypass graft placement. The graft is grossly patent at this time.  SIGNATURE  Electronically Signed   By: Vinnie Langton M.D.   On: 06/11/2013 10:46    Assessment and Plan:   68 year old gentleman diagnosed with prostate cancer in June of 2013. It a PSA of 11.2 and a palpable right prostate lesion. His biopsy indicated a Gleason score 4+4 equals 8 and all of the cores of his biopsy. He also had a metastatic disease involving external iliac lymph node. His initial clinical staging is T3b N1 disease and was treated with androgen deprivation. He had a PSA nadir down to 0.12 in February of 2014. Most recently his PSA up to 0.66 with staging workup did not reveal any measurable disease at this time. His lymphadenopathy have resolved with the treatments of androgen deprivation. The natural course of advanced prostate cancer was discussed with the patient today as well as treatment options. I feel that the role of salvage  local therapy with her it's radiation or surgery is not clearly defined in this particular setting. Especially in the setting of a slowly rising PSA. I feel that his disease is systemic and has been systemic from the time of the diagnosis. I do agree with adding Casodex due to his slow rise in his PSA and I think it's reasonable maneuver in attempt to control his biochemical relapse. If his PSA continue to rise circle on hormonal manipulation may be used in the form of ketoconazole and anti-androgen withdrawal. Other agents such as a Provenge immune therapy, Xtandi, Zytiga and systemic chemotherapy are not really indicated at this point but certainly in his future. For the time being, I do not feel the need to add any of these agents but certainly in the future it could be a possibility. All his questions were answered today hewill ll followup in 3 months for another evaluation.

## 2013-07-10 LAB — PSA: PSA: 0.27 ng/mL (ref <=4.00)

## 2013-07-10 LAB — TESTOSTERONE: Testosterone: 35 ng/dL — ABNORMAL LOW (ref 300–890)

## 2013-07-30 ENCOUNTER — Other Ambulatory Visit: Payer: Self-pay | Admitting: *Deleted

## 2013-07-30 MED ORDER — SPIRONOLACTONE 25 MG PO TABS: 12.5000 mg | ORAL_TABLET | Freq: Every day | ORAL | Status: DC

## 2013-07-30 NOTE — Telephone Encounter (Signed)
Call patient : Prescription refilled & sent to pharmacy in [place]. 

## 2013-07-30 NOTE — Telephone Encounter (Signed)
Last ov 06/30/13

## 2013-08-06 ENCOUNTER — Telehealth: Payer: Self-pay | Admitting: Family Medicine

## 2013-08-06 NOTE — Telephone Encounter (Signed)
appt given for friday

## 2013-08-08 ENCOUNTER — Ambulatory Visit (INDEPENDENT_AMBULATORY_CARE_PROVIDER_SITE_OTHER): Payer: Medicare Other | Admitting: Family Medicine

## 2013-08-08 ENCOUNTER — Encounter: Payer: Self-pay | Admitting: Family Medicine

## 2013-08-08 VITALS — BP 152/88 | HR 65 | Temp 97.1°F | Ht 68.0 in | Wt 285.0 lb

## 2013-08-08 DIAGNOSIS — M1990 Unspecified osteoarthritis, unspecified site: Secondary | ICD-10-CM

## 2013-08-08 DIAGNOSIS — I2510 Atherosclerotic heart disease of native coronary artery without angina pectoris: Secondary | ICD-10-CM

## 2013-08-08 DIAGNOSIS — M129 Arthropathy, unspecified: Secondary | ICD-10-CM | POA: Diagnosis not present

## 2013-08-08 MED ORDER — MELOXICAM 15 MG PO TABS: 15.0000 mg | ORAL_TABLET | Freq: Every day | ORAL | Status: DC

## 2013-08-08 NOTE — Progress Notes (Signed)
   Subjective:    Patient ID: [name], male    DOB: June 10, 1945, 68 y.o.   MRN: [id number]  HPI This 68 y.o. male presents for evaluation of arthritis in bilateral feet.  He has had this for over a week and is having problems with swelling.  He was on mobic.  He has CKD and looks like mobic was dc'd.   Review of Systems C/o arthritis sx's No chest pain, SOB, HA, dizziness, vision change, N/V, diarrhea, constipation, dysuria, urinary urgency or frequency, myalgias, arthralgias or rash.     Objective:   Physical Exam Vital signs noted  Well developed well nourished male.  HEENT - Head atraumatic Normocephalic                Eyes - PERRLA, Conjuctiva - clear Sclera- Clear EOMI                Ears - EAC's Wnl TM's Wnl Gross Hearing WNL                Throat - oropharanx wnl Respiratory - Lungs CTA bilateral Cardiac - RRR S1 and S2 without murmur GI - Abdomen soft Nontender and bowel sounds active x 4 Extremities - pre tibial and pedal edema 1 plus MS - TTP bilateral feet       Assessment & Plan:  Arthritis - Plan: meloxicam (MOBIC) 15 MG tablet Explained use this judiciously and do not stay on it.  Edema - Bilateral venous compression stockings  [name] FNP

## 2013-08-15 ENCOUNTER — Ambulatory Visit (INDEPENDENT_AMBULATORY_CARE_PROVIDER_SITE_OTHER): Payer: Medicare Other | Admitting: Urology

## 2013-08-15 DIAGNOSIS — N3941 Urge incontinence: Secondary | ICD-10-CM | POA: Diagnosis not present

## 2013-08-15 DIAGNOSIS — C61 Malignant neoplasm of prostate: Secondary | ICD-10-CM

## 2013-08-15 DIAGNOSIS — N138 Other obstructive and reflux uropathy: Secondary | ICD-10-CM

## 2013-08-15 DIAGNOSIS — N403 Nodular prostate with lower urinary tract symptoms: Secondary | ICD-10-CM

## 2013-08-18 ENCOUNTER — Other Ambulatory Visit: Payer: Self-pay | Admitting: Radiation Oncology

## 2013-09-01 ENCOUNTER — Telehealth: Payer: Self-pay | Admitting: Cardiology

## 2013-09-01 DIAGNOSIS — M25569 Pain in unspecified knee: Secondary | ICD-10-CM | POA: Diagnosis not present

## 2013-09-01 NOTE — Telephone Encounter (Deleted)
Received fax refill request  Rx # [id number] Medication:  Carvedilol 25 mg tab Qty 30 Sig:  Take one tablet by mouth twice daily Physician:  [place]

## 2013-09-03 ENCOUNTER — Telehealth: Payer: Self-pay | Admitting: *Deleted

## 2013-09-03 ENCOUNTER — Other Ambulatory Visit: Payer: Self-pay

## 2013-09-03 ENCOUNTER — Telehealth: Payer: Self-pay | Admitting: Family Medicine

## 2013-09-03 MED ORDER — CARVEDILOL 25 MG PO TABS: 25.0000 mg | ORAL_TABLET | Freq: Two times a day (BID) | ORAL | Status: DC

## 2013-09-03 MED ORDER — ALLOPURINOL 300 MG PO TABS: ORAL_TABLET | ORAL | Status: DC

## 2013-09-03 NOTE — Telephone Encounter (Signed)
Medication sent via escribe.  

## 2013-09-03 NOTE — Telephone Encounter (Signed)
Pt needs carvedilol called in today of possible

## 2013-09-03 NOTE — Telephone Encounter (Signed)
done

## 2013-09-09 ENCOUNTER — Encounter: Payer: Self-pay | Admitting: Family Medicine

## 2013-09-09 ENCOUNTER — Ambulatory Visit (INDEPENDENT_AMBULATORY_CARE_PROVIDER_SITE_OTHER): Payer: Medicare Other | Admitting: Family Medicine

## 2013-09-09 VITALS — BP 153/84 | HR 71 | Temp 96.9°F | Wt 284.2 lb

## 2013-09-09 DIAGNOSIS — I2510 Atherosclerotic heart disease of native coronary artery without angina pectoris: Secondary | ICD-10-CM

## 2013-09-09 DIAGNOSIS — R252 Cramp and spasm: Secondary | ICD-10-CM

## 2013-09-09 NOTE — Progress Notes (Signed)
   Subjective:    Patient ID: SAMIT SYLVE, male    DOB: May 03, 1945, 68 y.o.   MRN: [id number]  HPI  This 68 y.o. male presents for evaluation of leg cramps at hs.  He has taken sinemet and cannot tolerate it.  He was told by pharmacist that the cramps could be from pravachol.  Review of Systems C/o cramps   No chest pain, SOB, HA, dizziness, vision change, N/V, diarrhea, constipation, dysuria, urinary urgency or frequency, myalgias, arthralgias or rash.  Objective:   Physical Exam  Vital signs noted  Well developed well nourished male.  HEENT - Head atraumatic Normocephalic                Eyes - PERRLA, Conjuctiva - clear Sclera- Clear EOMI                Ears - EAC's Wnl TM's Wnl Gross Hearing WNL                Nose - Nares patent                 Throat - oropharanx wnl Respiratory - Lungs CTA bilateral Cardiac - RRR S1 and S2 without murmur GI - Abdomen soft Nontender and bowel sounds active x 4 Extremities - No edema. Neuro - Grossly intact.      Assessment & Plan:  Night cramps - Plan: BMP8+EGFR Hold pravachol Take tonic water otc Follow up prn  [name] FNP

## 2013-09-11 LAB — BMP8+EGFR
BUN/Creatinine Ratio: 14 (ref 10–22)
BUN: 26 mg/dL (ref 8–27)
CO2: 20 mmol/L (ref 18–29)
Calcium: 8.8 mg/dL (ref 8.6–10.2)
Chloride: 106 mmol/L (ref 97–108)
Creatinine, Ser: 1.8 mg/dL — ABNORMAL HIGH (ref 0.76–1.27)
GFR calc Af Amer: 44 mL/min/{1.73_m2} — ABNORMAL LOW (ref >59)
GFR calc non Af Amer: 38 mL/min/{1.73_m2} — ABNORMAL LOW (ref >59)
Glucose: 123 mg/dL — ABNORMAL HIGH (ref 65–99)
Potassium: 4.1 mmol/L (ref 3.5–5.2)
Sodium: 144 mmol/L (ref 134–144)

## 2013-09-12 ENCOUNTER — Other Ambulatory Visit: Payer: Self-pay | Admitting: Family Medicine

## 2013-09-12 DIAGNOSIS — N189 Chronic kidney disease, unspecified: Secondary | ICD-10-CM

## 2013-09-22 DIAGNOSIS — M1710 Unilateral primary osteoarthritis, unspecified knee: Secondary | ICD-10-CM | POA: Diagnosis not present

## 2013-09-29 DIAGNOSIS — M1710 Unilateral primary osteoarthritis, unspecified knee: Secondary | ICD-10-CM | POA: Diagnosis not present

## 2013-10-01 ENCOUNTER — Other Ambulatory Visit (HOSPITAL_BASED_OUTPATIENT_CLINIC_OR_DEPARTMENT_OTHER): Payer: Medicare Other

## 2013-10-01 ENCOUNTER — Encounter: Payer: Self-pay | Admitting: Oncology

## 2013-10-01 ENCOUNTER — Ambulatory Visit (HOSPITAL_BASED_OUTPATIENT_CLINIC_OR_DEPARTMENT_OTHER): Payer: Medicare Other | Admitting: Oncology

## 2013-10-01 VITALS — BP 137/90 | HR 69 | Temp 97.7°F | Resp 18 | Ht 68.0 in | Wt 280.2 lb

## 2013-10-01 DIAGNOSIS — C61 Malignant neoplasm of prostate: Secondary | ICD-10-CM | POA: Diagnosis not present

## 2013-10-01 LAB — CBC WITH DIFFERENTIAL/PLATELET
BASO%: 0.9 % (ref 0.0–2.0)
Basophils Absolute: 0.1 10*3/uL (ref 0.0–0.1)
EOS%: 6.5 % (ref 0.0–7.0)
Eosinophils Absolute: 0.5 10*3/uL (ref 0.0–0.5)
HCT: 41.6 % (ref 38.4–49.9)
HGB: 13.7 g/dL (ref 13.0–17.1)
LYMPH%: 16.9 % (ref 14.0–49.0)
MCH: 29.5 pg (ref 27.2–33.4)
MCHC: 32.8 g/dL (ref 32.0–36.0)
MCV: 89.7 fL (ref 79.3–98.0)
MONO#: 0.5 10*3/uL (ref 0.1–0.9)
MONO%: 6.2 % (ref 0.0–14.0)
NEUT#: 5.3 10*3/uL (ref 1.5–6.5)
NEUT%: 69.5 % (ref 39.0–75.0)
Platelets: 276 10*3/uL (ref 140–400)
RBC: 4.64 10*6/uL (ref 4.20–5.82)
RDW: 14.5 % (ref 11.0–14.6)
WBC: 7.6 10*3/uL (ref 4.0–10.3)
lymph#: 1.3 10*3/uL (ref 0.9–3.3)

## 2013-10-01 LAB — COMPREHENSIVE METABOLIC PANEL (CC13)
ALT: 21 U/L (ref 0–55)
AST: 15 U/L (ref 5–34)
Albumin: 4.2 g/dL (ref 3.5–5.0)
Alkaline Phosphatase: 70 U/L (ref 40–150)
Anion Gap: 12 mEq/L — ABNORMAL HIGH (ref 3–11)
BUN: 29.9 mg/dL — ABNORMAL HIGH (ref 7.0–26.0)
CO2: 20 mEq/L — ABNORMAL LOW (ref 22–29)
Calcium: 9.3 mg/dL (ref 8.4–10.4)
Chloride: 110 mEq/L — ABNORMAL HIGH (ref 98–109)
Creatinine: 1.9 mg/dL — ABNORMAL HIGH (ref 0.7–1.3)
Glucose: 106 mg/dl (ref 70–140)
Potassium: 4.5 mEq/L (ref 3.5–5.1)
Sodium: 142 mEq/L (ref 136–145)
Total Bilirubin: 0.57 mg/dL (ref 0.20–1.20)
Total Protein: 7.1 g/dL (ref 6.4–8.3)

## 2013-10-01 NOTE — Progress Notes (Signed)
Hematology and Oncology Follow Up Visit  [name] [id number] 1945-07-06 68 y.o. 10/01/2013 1:29 PM Anthoney Harada, MDWong, [name], MD   Principle Diagnosis: 68 year old gentleman with prostate cancer diagnosed in June of 2013. His Gleason score was 4+4 equals 8 and a PSA of 11.2. He presented with a large external iliac lymph node.   Prior Therapy: He was treated with androgen depravation only with Lupron and his PSA nadired at around 0.12 in February of 2014.  Subsequently had a slow rise in his PSA to 0.18 in August of 2014. To 0.32 in October 2014 and in December of 2014 was up to 0.30 and in February of 2015 his PSA was up to 0.66. He had a staging workup done in March of 2015 including a CT scan of the bone scan and did not show any evidence of metastatic disease. He was started on Casodex April of 2015.   Current therapy: Combined androgen deprivation with Lupron and Casodex. He is under consideration for salvage radiation therapy.  Interim History:  [name] presents today for a followup visit. He is a pleasant gentleman returns today after his initial consultation on 07/09/2013. He continues to be asymptomatic from his prostate cancer. He does not report any back pain or shoulder pain. He does report knee pain which is arthritic in nature. He does not report any dysuria or hematuria. But he does report nocturia and frequency. His appetite and performance status remains at baseline. He has tolerated Casodex without any complications. He does report grade 1-2 fatigue but otherwise asymptomatic.  Medications: I have reviewed the patient's current medications.  Current Outpatient Prescriptions  Medication Sig Dispense Refill  . allopurinol (ZYLOPRIM) 300 MG tablet TAKE ONE TABLET BY MOUTH ONCE DAILY  30 tablet  2  . amLODipine (NORVASC) 10 MG tablet TAKE ONE TABLET BY MOUTH ONCE DAILY  30 tablet  4  . bicalutamide (CASODEX) 50 MG tablet Take 50 mg by mouth daily.        . carvedilol (COREG) 25 MG tablet Take 1 tablet (25 mg total) by mouth 2 (two) times daily.  30 tablet  6  . cloNIDine (CATAPRES) 0.1 MG tablet Take 2 tablets (0.2 mg total) by mouth 2 (two) times daily.  360 tablet  1  . losartan (COZAAR) 50 MG tablet Take 1 tablet (50 mg total) by mouth daily.  30 tablet  11  . oxybutynin (DITROPAN) 5 MG tablet Take 5 mg by mouth daily.       . pantoprazole (PROTONIX) 40 MG tablet Take 1 tablet (40 mg total) by mouth 2 (two) times daily.  60 tablet  3  . spironolactone (ALDACTONE) 25 MG tablet Take 0.5 tablets (12.5 mg total) by mouth daily.  15 tablet  6  . VOLTAREN 1 % GEL       . pravastatin (PRAVACHOL) 80 MG tablet TAKE ONE TABLET BY MOUTH ONCE DAILY IN THE EVENING  30 tablet  2   No current facility-administered medications for this visit.     Allergies:  Allergies  Allergen Reactions  . Carbidopa-Levodopa     hallunications  . Morphine Sulfate Nausea And Vomiting  . Sulfacetamide Sodium Nausea And Vomiting  . Sulfamethoxazole-Trimethoprim Nausea And Vomiting    Past Medical History, Surgical history, Social history, and Family History were reviewed and updated.  Review of Systems: Constitutional:  Negative for fever, chills, night sweats, anorexia, weight loss, pain. Cardiovascular: no chest pain or dyspnea on exertion Respiratory: no cough,  shortness of breath, or wheezing Neurological: no TIA or stroke symptoms Dermatological: negative ENT: negative Skin: Negative. Gastrointestinal: no abdominal pain, change in bowel habits, or black or bloody stools Genito-Urinary: no dysuria, trouble voiding, or hematuria Hematological and Lymphatic: negative Breast: negative Musculoskeletal: negative Remaining ROS negative. Physical Exam: Blood pressure 137/90, pulse 69, temperature 97.7 F (36.5 C), temperature source Oral, resp. rate 18, height 5\' 8"  (1.727 m), weight 280 lb 3.2 oz (127.098 kg). ECOG: 0 General appearance: alert and  cooperative Head: Normocephalic, without obvious abnormality Neck: no adenopathy Lymph nodes: Cervical, supraclavicular, and axillary nodes normal. Heart:regular rate and rhythm, S1, S2 normal, no murmur, click, rub or gallop Lung:chest clear, no wheezing, rales, normal symmetric air entry Abdomin: soft, non-tender, without masses or organomegaly EXT:no erythema, induration, or nodules   Lab Results: Lab Results  Component Value Date   WBC 7.6 10/01/2013   HGB 13.7 10/01/2013   HCT 41.6 10/01/2013   MCV 89.7 10/01/2013   PLT 276 10/01/2013     Chemistry      Component Value Date/Time   NA 142 10/01/2013 1226   NA 144 09/09/2013 1754   NA 143 10/15/2012 1320   K 4.5 10/01/2013 1226   K 4.1 09/09/2013 1754   CL 106 09/09/2013 1754   CO2 20* 10/01/2013 1226   CO2 20 09/09/2013 1754   BUN 29.9* 10/01/2013 1226   BUN 26 09/09/2013 1754   BUN 17 10/15/2012 1320   CREATININE 1.9* 10/01/2013 1226   CREATININE 1.80* 09/09/2013 1754   CREATININE 1.71* 10/15/2012 1320      Component Value Date/Time   CALCIUM 9.3 10/01/2013 1226   CALCIUM 8.8 09/09/2013 1754   ALKPHOS 70 10/01/2013 1226   ALKPHOS 77 03/04/2013 1007   AST 15 10/01/2013 1226   AST 16 03/04/2013 1007   ALT 21 10/01/2013 1226   ALT 20 03/04/2013 1007   BILITOT 0.57 10/01/2013 1226   BILITOT 0.5 03/04/2013 1007       Results for [name] (MRN [id number]) as of 10/01/2013 13:33  Ref. Range 07/09/2013 10:29  PSA Latest Range: <=4.00 ng/mL 0.27    Impression and Plan:  68 year old gentleman with the following issues:  1. Locally advanced prostate cancer diagnosed in 2013. He had a Gleason score 4+4 equals 8 with a PSA of 11.2. He has been treated with androgen depravation with near complete response. He started on Casodex with his PSA and the excellent control dropping from 0.66 to 0.27. From a systemic therapies standpoint, I have recommended continuing on the current medication. We will consider second line hormonal manipulation in the future if needed  to.  In terms of local control, I discussed with him the role of radiation therapy which he still hesitant about. I do agree with Dr. Tammi Klippel regarding the benefits associated with radiation therapy. It probably will offer disease control about 10% of the time with the likelihood that his disease already become metastatic. I recommended that he consider his radiation seriously if he was to be very aggressive about treating his cancer. I see no reason why he would not be a candidate for and why she would not be aggressive at this point. Despite this conversation, he still very hesitant and will like to think about it and consider it in the future.  2. Followup: Will be in 3 months.  [facility], MD 7/1/20151:29 PM

## 2013-10-02 LAB — PSA: PSA: 0.37 ng/mL (ref <=4.00)

## 2013-10-06 ENCOUNTER — Telehealth: Payer: Self-pay | Admitting: Oncology

## 2013-10-06 DIAGNOSIS — M1710 Unilateral primary osteoarthritis, unspecified knee: Secondary | ICD-10-CM | POA: Diagnosis not present

## 2013-10-06 NOTE — Telephone Encounter (Signed)
Spk w/pt per 10/01/13 POF apt labs/ov on 01/06/14, pt confirmed [name]

## 2013-10-17 ENCOUNTER — Ambulatory Visit (INDEPENDENT_AMBULATORY_CARE_PROVIDER_SITE_OTHER): Payer: Medicare Other | Admitting: Urology

## 2013-10-17 DIAGNOSIS — C61 Malignant neoplasm of prostate: Secondary | ICD-10-CM | POA: Diagnosis not present

## 2013-10-17 DIAGNOSIS — N3941 Urge incontinence: Secondary | ICD-10-CM | POA: Diagnosis not present

## 2013-11-11 ENCOUNTER — Other Ambulatory Visit: Payer: Self-pay | Admitting: Family Medicine

## 2013-11-12 DIAGNOSIS — C61 Malignant neoplasm of prostate: Secondary | ICD-10-CM | POA: Diagnosis not present

## 2013-11-19 DIAGNOSIS — C61 Malignant neoplasm of prostate: Secondary | ICD-10-CM | POA: Diagnosis not present

## 2013-11-28 DIAGNOSIS — C61 Malignant neoplasm of prostate: Secondary | ICD-10-CM | POA: Diagnosis not present

## 2013-11-29 ENCOUNTER — Other Ambulatory Visit: Payer: Self-pay | Admitting: Family Medicine

## 2013-12-02 DIAGNOSIS — C61 Malignant neoplasm of prostate: Secondary | ICD-10-CM | POA: Diagnosis not present

## 2013-12-02 DIAGNOSIS — Z510 Encounter for antineoplastic radiation therapy: Secondary | ICD-10-CM | POA: Diagnosis not present

## 2013-12-04 DIAGNOSIS — C61 Malignant neoplasm of prostate: Secondary | ICD-10-CM | POA: Diagnosis not present

## 2013-12-05 DIAGNOSIS — C61 Malignant neoplasm of prostate: Secondary | ICD-10-CM | POA: Diagnosis not present

## 2013-12-09 DIAGNOSIS — C61 Malignant neoplasm of prostate: Secondary | ICD-10-CM | POA: Diagnosis not present

## 2013-12-10 DIAGNOSIS — C61 Malignant neoplasm of prostate: Secondary | ICD-10-CM | POA: Diagnosis not present

## 2013-12-11 DIAGNOSIS — C61 Malignant neoplasm of prostate: Secondary | ICD-10-CM | POA: Diagnosis not present

## 2013-12-12 ENCOUNTER — Ambulatory Visit (INDEPENDENT_AMBULATORY_CARE_PROVIDER_SITE_OTHER): Payer: Medicare Other | Admitting: Urology

## 2013-12-12 DIAGNOSIS — R972 Elevated prostate specific antigen [PSA]: Secondary | ICD-10-CM | POA: Diagnosis not present

## 2013-12-12 DIAGNOSIS — N3941 Urge incontinence: Secondary | ICD-10-CM

## 2013-12-12 DIAGNOSIS — C61 Malignant neoplasm of prostate: Secondary | ICD-10-CM | POA: Diagnosis not present

## 2013-12-14 ENCOUNTER — Other Ambulatory Visit: Payer: Self-pay | Admitting: Family Medicine

## 2013-12-15 DIAGNOSIS — C61 Malignant neoplasm of prostate: Secondary | ICD-10-CM | POA: Diagnosis not present

## 2013-12-16 ENCOUNTER — Encounter: Payer: Self-pay | Admitting: *Deleted

## 2013-12-17 DIAGNOSIS — C61 Malignant neoplasm of prostate: Secondary | ICD-10-CM | POA: Diagnosis not present

## 2013-12-18 DIAGNOSIS — C61 Malignant neoplasm of prostate: Secondary | ICD-10-CM | POA: Diagnosis not present

## 2013-12-19 DIAGNOSIS — C61 Malignant neoplasm of prostate: Secondary | ICD-10-CM | POA: Diagnosis not present

## 2013-12-22 DIAGNOSIS — C61 Malignant neoplasm of prostate: Secondary | ICD-10-CM | POA: Diagnosis not present

## 2013-12-23 DIAGNOSIS — C61 Malignant neoplasm of prostate: Secondary | ICD-10-CM | POA: Diagnosis not present

## 2013-12-24 ENCOUNTER — Other Ambulatory Visit: Payer: Self-pay | Admitting: Radiation Oncology

## 2013-12-24 DIAGNOSIS — B377 Candidal sepsis: Secondary | ICD-10-CM

## 2013-12-24 DIAGNOSIS — C61 Malignant neoplasm of prostate: Secondary | ICD-10-CM | POA: Diagnosis not present

## 2013-12-24 MED ORDER — MICONAZOLE NITRATE 2 % EX POWD: CUTANEOUS | Status: DC | PRN

## 2013-12-25 DIAGNOSIS — C61 Malignant neoplasm of prostate: Secondary | ICD-10-CM | POA: Diagnosis not present

## 2013-12-26 DIAGNOSIS — C61 Malignant neoplasm of prostate: Secondary | ICD-10-CM | POA: Diagnosis not present

## 2013-12-29 DIAGNOSIS — C61 Malignant neoplasm of prostate: Secondary | ICD-10-CM | POA: Diagnosis not present

## 2013-12-30 DIAGNOSIS — C61 Malignant neoplasm of prostate: Secondary | ICD-10-CM | POA: Diagnosis not present

## 2013-12-31 DIAGNOSIS — C61 Malignant neoplasm of prostate: Secondary | ICD-10-CM | POA: Diagnosis not present

## 2014-01-01 DIAGNOSIS — R351 Nocturia: Secondary | ICD-10-CM | POA: Diagnosis not present

## 2014-01-01 DIAGNOSIS — Z87442 Personal history of urinary calculi: Secondary | ICD-10-CM | POA: Diagnosis not present

## 2014-01-01 DIAGNOSIS — C61 Malignant neoplasm of prostate: Secondary | ICD-10-CM | POA: Diagnosis not present

## 2014-01-01 DIAGNOSIS — Z510 Encounter for antineoplastic radiation therapy: Secondary | ICD-10-CM | POA: Diagnosis not present

## 2014-01-01 DIAGNOSIS — R350 Frequency of micturition: Secondary | ICD-10-CM | POA: Diagnosis not present

## 2014-01-01 DIAGNOSIS — R300 Dysuria: Secondary | ICD-10-CM | POA: Diagnosis not present

## 2014-01-02 DIAGNOSIS — C61 Malignant neoplasm of prostate: Secondary | ICD-10-CM | POA: Diagnosis not present

## 2014-01-05 DIAGNOSIS — C61 Malignant neoplasm of prostate: Secondary | ICD-10-CM | POA: Diagnosis not present

## 2014-01-06 ENCOUNTER — Ambulatory Visit: Payer: Medicare Other | Admitting: Oncology

## 2014-01-06 ENCOUNTER — Other Ambulatory Visit: Payer: Medicare Other

## 2014-01-06 DIAGNOSIS — C61 Malignant neoplasm of prostate: Secondary | ICD-10-CM | POA: Diagnosis not present

## 2014-01-07 DIAGNOSIS — C61 Malignant neoplasm of prostate: Secondary | ICD-10-CM | POA: Diagnosis not present

## 2014-01-08 DIAGNOSIS — C61 Malignant neoplasm of prostate: Secondary | ICD-10-CM | POA: Diagnosis not present

## 2014-01-09 DIAGNOSIS — C61 Malignant neoplasm of prostate: Secondary | ICD-10-CM | POA: Diagnosis not present

## 2014-01-12 DIAGNOSIS — C61 Malignant neoplasm of prostate: Secondary | ICD-10-CM | POA: Diagnosis not present

## 2014-01-13 DIAGNOSIS — C61 Malignant neoplasm of prostate: Secondary | ICD-10-CM | POA: Diagnosis not present

## 2014-01-14 DIAGNOSIS — C61 Malignant neoplasm of prostate: Secondary | ICD-10-CM | POA: Diagnosis not present

## 2014-01-15 DIAGNOSIS — C61 Malignant neoplasm of prostate: Secondary | ICD-10-CM | POA: Diagnosis not present

## 2014-01-16 DIAGNOSIS — C61 Malignant neoplasm of prostate: Secondary | ICD-10-CM | POA: Diagnosis not present

## 2014-01-16 NOTE — Telephone Encounter (Signed)
error 

## 2014-01-19 DIAGNOSIS — C61 Malignant neoplasm of prostate: Secondary | ICD-10-CM | POA: Diagnosis not present

## 2014-01-20 DIAGNOSIS — C61 Malignant neoplasm of prostate: Secondary | ICD-10-CM | POA: Diagnosis not present

## 2014-01-21 DIAGNOSIS — C61 Malignant neoplasm of prostate: Secondary | ICD-10-CM | POA: Diagnosis not present

## 2014-01-22 DIAGNOSIS — C61 Malignant neoplasm of prostate: Secondary | ICD-10-CM | POA: Diagnosis not present

## 2014-01-23 ENCOUNTER — Ambulatory Visit (INDEPENDENT_AMBULATORY_CARE_PROVIDER_SITE_OTHER): Payer: Medicare Other | Admitting: Urology

## 2014-01-23 DIAGNOSIS — N3941 Urge incontinence: Secondary | ICD-10-CM | POA: Diagnosis not present

## 2014-01-23 DIAGNOSIS — R351 Nocturia: Secondary | ICD-10-CM | POA: Diagnosis not present

## 2014-01-23 DIAGNOSIS — C61 Malignant neoplasm of prostate: Secondary | ICD-10-CM | POA: Diagnosis not present

## 2014-01-23 DIAGNOSIS — R3915 Urgency of urination: Secondary | ICD-10-CM | POA: Diagnosis not present

## 2014-01-26 DIAGNOSIS — C61 Malignant neoplasm of prostate: Secondary | ICD-10-CM | POA: Diagnosis not present

## 2014-01-26 DIAGNOSIS — Z23 Encounter for immunization: Secondary | ICD-10-CM | POA: Diagnosis not present

## 2014-01-27 DIAGNOSIS — C61 Malignant neoplasm of prostate: Secondary | ICD-10-CM | POA: Diagnosis not present

## 2014-01-28 DIAGNOSIS — C61 Malignant neoplasm of prostate: Secondary | ICD-10-CM | POA: Diagnosis not present

## 2014-01-29 DIAGNOSIS — C61 Malignant neoplasm of prostate: Secondary | ICD-10-CM | POA: Diagnosis not present

## 2014-01-30 DIAGNOSIS — C61 Malignant neoplasm of prostate: Secondary | ICD-10-CM | POA: Diagnosis not present

## 2014-02-02 ENCOUNTER — Other Ambulatory Visit: Payer: Self-pay | Admitting: Family Medicine

## 2014-02-04 NOTE — Telephone Encounter (Signed)
No uric acid level since 10/2012

## 2014-02-18 DIAGNOSIS — Z923 Personal history of irradiation: Secondary | ICD-10-CM | POA: Diagnosis not present

## 2014-02-18 DIAGNOSIS — C61 Malignant neoplasm of prostate: Secondary | ICD-10-CM | POA: Diagnosis not present

## 2014-02-20 ENCOUNTER — Encounter: Payer: Self-pay | Admitting: Family

## 2014-02-23 ENCOUNTER — Ambulatory Visit (HOSPITAL_COMMUNITY)
Admission: RE | Admit: 2014-02-23 | Discharge: 2014-02-23 | Disposition: A | Discharge status: Home / Self Care | Payer: Medicare Other | Source: Ambulatory Visit | Attending: Family | Admitting: Family

## 2014-02-23 ENCOUNTER — Ambulatory Visit (INDEPENDENT_AMBULATORY_CARE_PROVIDER_SITE_OTHER): Payer: Medicare Other | Admitting: Family

## 2014-02-23 ENCOUNTER — Other Ambulatory Visit: Payer: Self-pay | Admitting: Family Medicine

## 2014-02-23 ENCOUNTER — Encounter: Payer: Self-pay | Admitting: Family

## 2014-02-23 VITALS — BP 121/91 | HR 59 | Resp 16 | Ht 68.0 in | Wt 280.0 lb

## 2014-02-23 DIAGNOSIS — R252 Cramp and spasm: Secondary | ICD-10-CM | POA: Insufficient documentation

## 2014-02-23 DIAGNOSIS — Z95828 Presence of other vascular implants and grafts: Secondary | ICD-10-CM | POA: Diagnosis not present

## 2014-02-23 DIAGNOSIS — I2510 Atherosclerotic heart disease of native coronary artery without angina pectoris: Secondary | ICD-10-CM

## 2014-02-23 DIAGNOSIS — Z48812 Encounter for surgical aftercare following surgery on the circulatory system: Secondary | ICD-10-CM | POA: Diagnosis not present

## 2014-02-23 DIAGNOSIS — I714 Abdominal aortic aneurysm, without rupture, unspecified: Secondary | ICD-10-CM

## 2014-02-23 NOTE — Progress Notes (Signed)
[facility] [place]  Established EVAR  History of Present Illness  [name] is a 68 y.o. (02-26-1946) male who presents for routine follow up s/p EVAR.  He is status post endovascular aortic intervention on 09/13/2007 by Dr. [name]: an Endologix Powerlink bifurcated stent 25 x 16 x 120, proximal cuff 25 x 25 x 75. This was done for a penetrating ulcer. By CT scan in 2013, the above condition has completely resolved. Dr. Trula Slade will try to image this with ultrasound in the future to avoid getting frequent CT scans if it cannot be visualized in a year, would just have him come back the following year with a repeat attempt at ultrasound.  At patient's visit a year ago, Dr. Trula Slade had an extensive conversation with the patient regarding his need for weight loss; discussed were starting an exercise program and having a full chemistry profile, including thyroid panel to evaluate his potential explanations for his obesity. The patient has a history of prostate cancer.  Most recent CTA (Date: 06/11/2013, (ordered by Dr. [name]) demonstrates: Atherosclerosis throughout the abdominal and pelvic vasculature. Status post aorto bi-iliac stent graft placement. The stent graft is grossly patent at this time. No evidence of recurrent abdominal aortic aneurysm. Dr. Trula Slade reviewed these images today.  He has intermittent right flank pain since about October 2015, his urologist told pt that he may have another kidney stone, this is under evaluation. He sees Dr. [name], urologist, and Dr. Tammi Klippel, oncologist, for his prostate cancer treatment. He needs a right knee replacement and an MRI of this, his ortho is Dr. Hinton Lovely (sp?).   The patient has no had back or abdominal pain. Pt. States he needs right knee replacement; he is asking if he can have an MRI of right knee and needs to know if the aortic stent will preclude this. Has right knee pain.  He denies hx of stroke or  TIA symptoms, denies claudication symptoms, denies non-healing wounds. Pt Diabetic: No Pt smoker: smoker (1/2 ppd since age 19 yrs)    Past Medical History  Diagnosis Date  . Coronary atherosclerosis of native coronary artery     Mild atherosclerosis at catheterization 2011  . Gout   . Nephrolithiasis   . GERD (gastroesophageal reflux disease)   . Essential hypertension, benign   . Sleep apnea     Intolerant of CPAP  . Aortic atherosclerosis     Penetrating aortic ulcer s/p endograft 2004 - followed by Dr. Trula Slade  . Prostate cancer   . COPD (chronic obstructive pulmonary disease)     "collapsed left lung" years ago also  . Tubular adenoma of colon 08/2008   Past Surgical History  Procedure Laterality Date  . Aortic endograft repair  2004    Dr. [name]  . Colonoscopy w/ polypectomy    . Umbilical hernia repair    . Fracture surgery      Bilateral lower arms as a child  . Knee arthroscopy      Left  . Prostate biopsy  09/26/2011    Procedure: BIOPSY TRANSRECTAL ULTRASONIC PROSTATE (TUBP);  Surgeon: [name], MD;  Location: [facility];  Service: Urology;  Laterality: N/A;     . Circumcision  09/26/2011    Procedure: CIRCUMCISION ADULT;  Surgeon: [name], MD;  Location: [facility];  Service: Urology;  Laterality: N/A;  . Cystoscopy  09/26/2011    Procedure: CYSTOSCOPY;  Surgeon: [name], MD;  Location: [facility];  Service: Urology;  Laterality: N/A;  . Knee surgery  Sept. 26, 2013    Torn minisc.- Right  knee  . Evar    . Endovascular stent insertion  September 13, 2007    EVAR - Aortic stent graft   Social History History  Substance Use Topics  . Smoking status: Current Every Day Smoker -- 0.50 packs/day for 42 years    Types: Cigarettes  . Smokeless tobacco: Never Used  . Alcohol Use: No   Family History Family History  Problem Relation Age of Onset  . Stroke Mother   . Mesothelioma Father   . Cancer Father   . Hyperlipidemia Sister   . Heart attack Sister   .  Cancer Brother 54  . Heart disease Brother   . Hyperlipidemia Brother    Current Outpatient Prescriptions on File Prior to Visit  Medication Sig Dispense Refill  . allopurinol (ZYLOPRIM) 300 MG tablet TAKE ONE TABLET BY MOUTH ONCE DAILY 30 tablet 0  . amLODipine (NORVASC) 10 MG tablet TAKE ONE TABLET BY MOUTH ONCE DAILY 30 tablet 3  . bicalutamide (CASODEX) 50 MG tablet Take 50 mg by mouth daily.     . carvedilol (COREG) 25 MG tablet Take 1 tablet (25 mg total) by mouth 2 (two) times daily. 30 tablet 6  . cloNIDine (CATAPRES) 0.1 MG tablet Take 2 tablets (0.2 mg total) by mouth 2 (two) times daily. 360 tablet 1  . losartan (COZAAR) 50 MG tablet Take 1 tablet (50 mg total) by mouth daily. 30 tablet 11  . oxybutynin (DITROPAN) 5 MG tablet Take 5 mg by mouth daily.     . pantoprazole (PROTONIX) 40 MG tablet Take 1 tablet (40 mg total) by mouth 2 (two) times daily. 60 tablet 3  . pravastatin (PRAVACHOL) 80 MG tablet TAKE ONE TABLET BY MOUTH ONCE DAILY IN THE EVENING 30 tablet 2  . spironolactone (ALDACTONE) 25 MG tablet Take 0.5 tablets (12.5 mg total) by mouth daily. 15 tablet 6  . VOLTAREN 1 % GEL     . miconazole (MICOTIN) 2 % powder Apply topically as needed for itching (2-3 times daily). (Patient not taking: Reported on 02/23/2014) 70 g 2   No current facility-administered medications on file prior to visit.   Allergies  Allergen Reactions  . Carbidopa-Levodopa     hallunications  . Morphine Sulfate Nausea And Vomiting  . Sulfacetamide Sodium Nausea And Vomiting  . Sulfamethoxazole-Trimethoprim Nausea And Vomiting     ROS: See HPI for pertinent positives and negatives.  Physical Examination  Filed Vitals:   02/23/14 0915  BP: 121/91  Pulse: 59  Resp: 16  Height: 5\' 8"  (1.727 m)  Weight: 280 lb (127.007 kg)  SpO2: 98%    General: A&O x 3, WD morbidly Obese.  Pulmonary: Sym exp, good air movt, CTAB, no rales, rhonchi, & wheezing.  Cardiac: RRR, Nl S1, S2, positive  murmur  Vascular: Vessel Right Left  Radial 2+Palpable 2+Palpable  Carotid audiblewithout bruit Audible without bruit  Aorta Not palpable N/A  Popliteal Not palpable Not palpable  PT 2+Palpable 2+Palpable  DP 2+Palpable 2+Palpable   Gastrointestinal: soft, NTND, -G/R, - HSM, - masses palpated.  Musculoskeletal: M/S 5/5 throughout, Extremities without ischemic changes.  Neurologic: Pain and light touch intact in extremities, Motor exam as listed above   Non-Invasive Vascular Imaging  EVAR Duplex (Date: 02/23/2014)  suboptimal exam, unable to visualize the stent. The largest measurement visualized is 3.5 cm distal aorta   Most recent CTA:  (  Date: 06/11/2013, (ordered by Dr. [name]) demonstrates: Atherosclerosis throughout the abdominal and pelvic vasculature. Status post aorto bi-iliac stent graft placement. The stent graft is grossly patent at this time. No evidence of recurrent abdominal aortic aneurysm.    Medical Decision Making  [name] is a 68 y.o. male who presents s/p EVAR  on 09/13/2007 by Dr. [name]: an Cameron bifurcated stent 25 x 16 x 120, proximal cuff 25 x 25 x 75. This was done for a penetrating ulcer. Duplex of EVAR today was suboptimal, unable to visualize stent; discussed with Dr. Trula Slade. Dr. Trula Slade checked with the company that made pt's EVAR, and his EVAR is MRI compatible.    The patient was counseled re smoking cessation and given several free resources re smoking cessation.   I discussed with the patient the importance of surveillance of the endograft.  The next CTA will be scheduled for 3 years, non contrast, after discussing with Dr. Trula Slade.  The patient will follow up with Dr. Trula Slade in 3 years with this study.  I emphasized the importance of maximal medical management including strict control of blood pressure, blood glucose, and lipid levels, antiplatelet agents, obtaining regular exercise,  and cessation of smoking.   The patient was given information about AAA including signs, symptoms, treatment, and how to minimize the risk of enlargement and rupture of aneurysms.    Thank you for allowing [place] to participate in this patient's care.  Clemon Chambers, RN, MSN, FNP-C [facility] [place] Office: [facility] Physician: Trula Slade  02/23/2014, 9:15 AM

## 2014-02-23 NOTE — Addendum Note (Signed)
Addended by: Mena Goes on: 02/23/2014 11:35 AM   Modules accepted: Orders

## 2014-02-23 NOTE — Patient Instructions (Signed)
Abdominal Aortic Aneurysm An aneurysm is a weakened or damaged part of an artery wall that bulges from the normal force of blood pumping through the body. An abdominal aortic aneurysm is an aneurysm that occurs in the lower part of the aorta, the main artery of the body.  The major concern with an abdominal aortic aneurysm is that it can enlarge and burst (rupture) or blood can flow between the layers of the wall of the aorta through a tear (aorticdissection). Both of these conditions can cause bleeding inside the body and can be life threatening unless diagnosed and treated promptly. CAUSES  The exact cause of an abdominal aortic aneurysm is unknown. Some contributing factors are:   A hardening of the arteries caused by the buildup of fat and other substances in the lining of a blood vessel (arteriosclerosis).  Inflammation of the walls of an artery (arteritis).   Connective tissue diseases, such as Marfan syndrome.   Abdominal trauma.   An infection, such as syphilis or staphylococcus, in the wall of the aorta (infectious aortitis) caused by bacteria. RISK FACTORS  Risk factors that contribute to an abdominal aortic aneurysm may include:  Age older than 60 years.   High blood pressure (hypertension).  Male gender.  Ethnicity (white race).  Obesity.  Family history of aneurysm (first degree relatives only).  Tobacco use. PREVENTION  The following healthy lifestyle habits may help decrease your risk of abdominal aortic aneurysm:  Quitting smoking. Smoking can raise your blood pressure and cause arteriosclerosis.  Limiting or avoiding alcohol.  Keeping your blood pressure, blood sugar level, and cholesterol levels within normal limits.  Decreasing your salt intake. In somepeople, too much salt can raise blood pressure and increase your risk of abdominal aortic aneurysm.  Eating a diet low in saturated fats and cholesterol.  Increasing your fiber intake by including  whole grains, vegetables, and fruits in your diet. Eating these foods may help lower blood pressure.  Maintaining a healthy weight.  Staying physically active and exercising regularly. SYMPTOMS  The symptoms of abdominal aortic aneurysm may vary depending on the size and rate of growth of the aneurysm.Most grow slowly and do not have any symptoms. When symptoms do occur, they may include:  Pain (abdomen, side, lower back, or groin). The pain may vary in intensity. A sudden onset of severe pain may indicate that the aneurysm has ruptured.  Feeling full after eating only small amounts of food.  Nausea or vomiting or both.  Feeling a pulsating lump in the abdomen.  Feeling faint or passing out. DIAGNOSIS  Since most unruptured abdominal aortic aneurysms have no symptoms, they are often discovered during diagnostic exams for other conditions. An aneurysm may be found during the following procedures:  Ultrasonography (A one-time screening for abdominal aortic aneurysm by ultrasonography is also recommended for all men aged 65-75 years who have ever smoked).  X-ray exams.  A computed tomography (CT).  Magnetic resonance imaging (MRI).  Angiography or arteriography. TREATMENT  Treatment of an abdominal aortic aneurysm depends on the size of your aneurysm, your age, and risk factors for rupture. Medication to control blood pressure and pain may be used to manage aneurysms smaller than 6 cm. Regular monitoring for enlargement may be recommended by your caregiver if:  The aneurysm is 3-4 cm in size (an annual ultrasonography may be recommended).  The aneurysm is 4-4.5 cm in size (an ultrasonography every 6 months may be recommended).  The aneurysm is larger than 4.5 cm in   size (your caregiver may ask that you be examined by a vascular surgeon). If your aneurysm is larger than 6 cm, surgical repair may be recommended. There are two main methods for repair of an aneurysm:   Endovascular  repair (a minimally invasive surgery). This is done most often.  Open repair. This method is used if an endovascular repair is not possible. Document Released: 12/28/2004 Document Revised: 07/15/2012 Document Reviewed: 04/19/2012 [facility] Patient Information 2015 [place], [place]. This information is not intended to replace advice given to you by your health care provider. Make sure you discuss any questions you have with your health care provider.    Smoking Cessation Quitting smoking is important to your health and has many advantages. However, it is not always easy to quit since nicotine is a very addictive drug. Oftentimes, people try 3 times or more before being able to quit. This document explains the best ways for you to prepare to quit smoking. Quitting takes hard work and a lot of effort, but you can do it. ADVANTAGES OF QUITTING SMOKING  You will live longer, feel better, and live better.  Your body will feel the impact of quitting smoking almost immediately.  Within 20 minutes, blood pressure decreases. Your pulse returns to its normal level.  After 8 hours, carbon monoxide levels in the blood return to normal. Your oxygen level increases.  After 24 hours, the chance of having a heart attack starts to decrease. Your breath, hair, and body stop smelling like smoke.  After 48 hours, damaged nerve endings begin to recover. Your sense of taste and smell improve.  After 72 hours, the body is virtually free of nicotine. Your bronchial tubes relax and breathing becomes easier.  After 2 to 12 weeks, lungs can hold more air. Exercise becomes easier and circulation improves.  The risk of having a heart attack, stroke, cancer, or lung disease is greatly reduced.  After 1 year, the risk of coronary heart disease is cut in half.  After 5 years, the risk of stroke falls to the same as a nonsmoker.  After 10 years, the risk of lung cancer is cut in half and the risk of other cancers  decreases significantly.  After 15 years, the risk of coronary heart disease drops, usually to the level of a nonsmoker.  If you are pregnant, quitting smoking will improve your chances of having a healthy baby.  The people you live with, especially any children, will be healthier.  You will have extra money to spend on things other than cigarettes. QUESTIONS TO THINK ABOUT BEFORE ATTEMPTING TO QUIT You may want to talk about your answers with your health care provider.  Why do you want to quit?  If you tried to quit in the past, what helped and what did not?  What will be the most difficult situations for you after you quit? How will you plan to handle them?  Who can help you through the tough times? Your family? Friends? A health care provider?  What pleasures do you get from smoking? What ways can you still get pleasure if you quit? Here are some questions to ask your health care provider:  How can you help me to be successful at quitting?  What medicine do you think would be best for me and how should I take it?  What should I do if I need more help?  What is smoking withdrawal like? How can I get information on withdrawal? GET READY  Set a  quit date.  Change your environment by getting rid of all cigarettes, ashtrays, matches, and lighters in your home, car, or work. Do not let people smoke in your home.  Review your past attempts to quit. Think about what worked and what did not. GET SUPPORT AND ENCOURAGEMENT You have a better chance of being successful if you have help. You can get support in many ways.  Tell your family, friends, and coworkers that you are going to quit and need their support. Ask them not to smoke around you.  Get individual, group, or telephone counseling and support. Programs are available at [employer] and health centers. Call your local health department for information about programs in your area.  Spiritual beliefs and practices may  help some smokers quit.  Download a "quit meter" on your computer to keep track of quit statistics, such as how long you have gone without smoking, cigarettes not smoked, and money saved.  Get a self-help book about quitting smoking and staying off tobacco. [place] yourself from urges to smoke. Talk to someone, go for a walk, or occupy your time with a task.  Change your normal routine. Take a different route to work. Drink tea instead of coffee. Eat breakfast in a different place.  Reduce your stress. Take a hot bath, exercise, or read a book.  Plan something enjoyable to do every day. Reward yourself for not smoking.  Explore interactive web-based programs that specialize in helping you quit. GET MEDICINE AND USE IT CORRECTLY Medicines can help you stop smoking and decrease the urge to smoke. Combining medicine with the above behavioral methods and support can greatly increase your chances of successfully quitting smoking.  Nicotine replacement therapy helps deliver nicotine to your body without the negative effects and risks of smoking. Nicotine replacement therapy includes nicotine gum, lozenges, inhalers, nasal sprays, and skin patches. Some may be available over-the-counter and others require a prescription.  Antidepressant medicine helps people abstain from smoking, but how this works is unknown. This medicine is available by prescription.  Nicotinic receptor partial agonist medicine simulates the effect of nicotine in your brain. This medicine is available by prescription. Ask your health care provider for advice about which medicines to use and how to use them based on your health history. Your health care provider will tell you what side effects to look out for if you choose to be on a medicine or therapy. Carefully read the information on the package. Do not use any other product containing nicotine while using a nicotine replacement product.    RELAPSE OR DIFFICULT SITUATIONS Most relapses occur within the first 3 months after quitting. Do not be discouraged if you start smoking again. Remember, most people try several times before finally quitting. You may have symptoms of withdrawal because your body is used to nicotine. You may crave cigarettes, be irritable, feel very hungry, cough often, get headaches, or have difficulty concentrating. The withdrawal symptoms are only temporary. They are strongest when you first quit, but they will go away within 10-14 days. To reduce the chances of relapse, try to:  Avoid drinking alcohol. Drinking lowers your chances of successfully quitting.  Reduce the amount of caffeine you consume. Once you quit smoking, the amount of caffeine in your body increases and can give you symptoms, such as a rapid heartbeat, sweating, and anxiety.  Avoid smokers because they can make you want to smoke.  Do not let weight gain distract you.  Many smokers will gain weight when they quit, usually less than 10 pounds. Eat a healthy diet and stay active. You can always lose the weight gained after you quit.  Find ways to improve your mood other than smoking. FOR MORE INFORMATION  www.smokefree.gov  Document Released: 03/14/2001 Document Revised: 08/04/2013 Document Reviewed: 06/29/2011 [facility] Patient Information 2015 [place], [place]. This information is not intended to replace advice given to you by your health care provider. Make sure you discuss any questions you have with your health care provider.    Smoking Cessation, Tips for Success If you are ready to quit smoking, congratulations! You have chosen to help yourself be healthier. Cigarettes bring nicotine, tar, carbon monoxide, and other irritants into your body. Your lungs, heart, and blood vessels will be able to work better without these poisons. There are many different ways to quit smoking. Nicotine gum, nicotine patches, a nicotine inhaler, or nicotine  nasal spray can help with physical craving. Hypnosis, support groups, and medicines help break the habit of smoking. WHAT THINGS CAN I DO TO MAKE QUITTING EASIER?  Here are some tips to help you quit for good:  Pick a date when you will quit smoking completely. Tell all of your friends and family about your plan to quit on that date.  Do not try to slowly cut down on the number of cigarettes you are smoking. Pick a quit date and quit smoking completely starting on that day.  Throw away all cigarettes.   Clean and remove all ashtrays from your home, work, and car.  On a card, write down your reasons for quitting. Carry the card with you and read it when you get the urge to smoke.  Cleanse your body of nicotine. Drink enough water and fluids to keep your urine clear or pale yellow. Do this after quitting to flush the nicotine from your body.  Learn to predict your moods. Do not let a bad situation be your excuse to have a cigarette. Some situations in your life might tempt you into wanting a cigarette.  Never have "just one" cigarette. It leads to wanting another and another. Remind yourself of your decision to quit.  Change habits associated with smoking. If you smoked while driving or when feeling stressed, try other activities to replace smoking. Stand up when drinking your coffee. Brush your teeth after eating. Sit in a different chair when you read the paper. Avoid alcohol while trying to quit, and try to drink fewer caffeinated beverages. Alcohol and caffeine may urge you to smoke.  Avoid foods and drinks that can trigger a desire to smoke, such as sugary or spicy foods and alcohol.  Ask people who smoke not to smoke around you.  Have something planned to do right after eating or having a cup of coffee. For example, plan to take a walk or exercise.  Try a relaxation exercise to calm you down and decrease your stress. Remember, you may be tense and nervous for the first 2 weeks after  you quit, but this will pass.  Find new activities to keep your hands busy. Play with a pen, coin, or rubber band. Doodle or draw things on paper.  Brush your teeth right after eating. This will help cut down on the craving for the taste of tobacco after meals. You can also try mouthwash.   Use oral substitutes in place of cigarettes. Try using lemon drops, carrots, cinnamon sticks, or chewing gum. Keep them handy so they are available  when you have the urge to smoke.  When you have the urge to smoke, try deep breathing.  Designate your home as a nonsmoking area.  If you are a heavy smoker, ask your health care provider about a prescription for nicotine chewing gum. It can ease your withdrawal from nicotine.  Reward yourself. Set aside the cigarette money you save and buy yourself something nice.  Look for support from others. Join a support group or smoking cessation program. Ask someone at home or at work to help you with your plan to quit smoking.  Always ask yourself, "Do I need this cigarette or is this just a reflex?" Tell yourself, "Today, I choose not to smoke," or "I do not want to smoke." You are reminding yourself of your decision to quit.  Do not replace cigarette smoking with electronic cigarettes (commonly called e-cigarettes). The safety of e-cigarettes is unknown, and some may contain harmful chemicals.  If you relapse, do not give up! Plan ahead and think about what you will do the next time you get the urge to smoke. HOW WILL I FEEL WHEN I QUIT SMOKING? You may have symptoms of withdrawal because your body is used to nicotine (the addictive substance in cigarettes). You may crave cigarettes, be irritable, feel very hungry, cough often, get headaches, or have difficulty concentrating. The withdrawal symptoms are only temporary. They are strongest when you first quit but will go away within 10-14 days. When withdrawal symptoms occur, stay in control. Think about your reasons  for quitting. Remind yourself that these are signs that your body is healing and getting used to being without cigarettes. Remember that withdrawal symptoms are easier to treat than the major diseases that smoking can cause.  Even after the withdrawal is over, expect periodic urges to smoke. However, these cravings are generally short lived and will go away whether you smoke or not. Do not smoke! WHAT RESOURCES ARE AVAILABLE TO HELP ME QUIT SMOKING? Your health care provider can direct you to community resources or hospitals for support, which may include:  Group support.  Education.  Hypnosis.  Therapy. Document Released: 12/17/2003 Document Revised: 08/04/2013 Document Reviewed: 09/05/2012 [facility] Patient Information 2015 [place], [place]. This information is not intended to replace advice given to you by your health care provider. Make sure you discuss any questions you have with your health care provider.   Obesity Obesity is defined as having too much total body fat and a body mass index (BMI) of 30 or more. BMI is an estimate of body fat and is calculated from your height and weight. Obesity happens when you consume more calories than you can burn by exercising or performing daily physical tasks. Prolonged obesity can cause major illnesses or emergencies, such as:   Stroke.  Heart disease.  Diabetes.  Cancer.  Arthritis.  High blood pressure (hypertension).  High cholesterol.  Sleep apnea.  Erectile dysfunction.  Infertility problems. CAUSES   Regularly eating unhealthy foods.  Physical inactivity.  Certain disorders, such as an underactive thyroid (hypothyroidism), Cushing's syndrome, and polycystic ovarian syndrome.  Certain medicines, such as steroids, some depression medicines, and antipsychotics.  Genetics.  Lack of sleep. DIAGNOSIS  A health care provider can diagnose obesity after calculating your BMI. Obesity will be diagnosed if your BMI is 30 or  higher.  There are other methods of measuring obesity levels. Some other methods include measuring your skinfold thickness, your waist circumference, and comparing your hip circumference to your waist circumference. TREATMENT  A healthy treatment program includes some or all of the following:  Long-term dietary changes.  Exercise and physical activity.  Behavioral and lifestyle changes.  Medicine only under the supervision of your health care provider. Medicines may help, but only if they are used with diet and exercise programs. An unhealthy treatment program includes:  Fasting.  Fad diets.  Supplements and drugs. These choices do not succeed in long-term weight control.  HOME CARE INSTRUCTIONS   Exercise and perform physical activity as directed by your health care provider. To increase physical activity, try the following:  Use stairs instead of elevators.  Park farther away from store entrances.  Garden, bike, or walk instead of watching television or using the computer.  Eat healthy, low-calorie foods and drinks on a regular basis. Eat more fruits and vegetables. Use low-calorie cookbooks or take healthy cooking classes.  Limit fast food, sweets, and processed snack foods.  Eat smaller portions.  Keep a daily journal of everything you eat. There are many free websites to help you with this. It may be helpful to measure your foods so you can determine if you are eating the correct portion sizes.  Avoid drinking alcohol. Drink more water and drinks without calories.  Take vitamins and supplements only as recommended by your health care provider.  Weight-loss support groups, Tax adviser, counselors, and stress reduction education can also be very helpful. SEEK IMMEDIATE MEDICAL CARE IF:  You have chest pain or tightness.  You have trouble breathing or feel short of breath.  You have weakness or leg numbness.  You feel confused or have trouble  talking.  You have sudden changes in your vision. MAKE SURE YOU:  Understand these instructions.  Will watch your condition.  Will get help right away if you are not doing well or get worse. Document Released: 04/27/2004 Document Revised: 08/04/2013 Document Reviewed: 04/26/2011 [facility] Patient Information 2015 [place], [place]. This information is not intended to replace advice given to you by your health care provider. Make sure you discuss any questions you have with your health care provider.

## 2014-02-24 ENCOUNTER — Other Ambulatory Visit: Payer: Self-pay

## 2014-02-24 MED ORDER — PANTOPRAZOLE SODIUM 40 MG PO TBEC: 40.0000 mg | DELAYED_RELEASE_TABLET | Freq: Two times a day (BID) | ORAL | Status: DC

## 2014-03-01 ENCOUNTER — Other Ambulatory Visit: Payer: Self-pay | Admitting: Family Medicine

## 2014-03-09 ENCOUNTER — Other Ambulatory Visit: Payer: Self-pay | Admitting: Family Medicine

## 2014-03-09 MED ORDER — SPIRONOLACTONE 25 MG PO TABS: 12.5000 mg | ORAL_TABLET | Freq: Every day | ORAL | Status: DC

## 2014-03-09 NOTE — Telephone Encounter (Signed)
done

## 2014-03-30 ENCOUNTER — Encounter: Payer: Self-pay | Admitting: Family Medicine

## 2014-03-30 ENCOUNTER — Ambulatory Visit (INDEPENDENT_AMBULATORY_CARE_PROVIDER_SITE_OTHER): Payer: Medicare Other | Admitting: Family Medicine

## 2014-03-30 VITALS — BP 150/81 | HR 68 | Temp 96.8°F | Ht 68.0 in | Wt 277.0 lb

## 2014-03-30 DIAGNOSIS — I2510 Atherosclerotic heart disease of native coronary artery without angina pectoris: Secondary | ICD-10-CM

## 2014-03-30 DIAGNOSIS — M5440 Lumbago with sciatica, unspecified side: Secondary | ICD-10-CM

## 2014-03-30 MED ORDER — DICLOFENAC SODIUM 1 % TD GEL: 2.0000 g | Freq: Four times a day (QID) | TRANSDERMAL | Status: DC

## 2014-03-30 MED ORDER — CYCLOBENZAPRINE HCL 10 MG PO TABS: 10.0000 mg | ORAL_TABLET | Freq: Three times a day (TID) | ORAL | Status: DC | PRN

## 2014-03-30 NOTE — Progress Notes (Signed)
   Subjective:    Patient ID: VIEN RICHBURG, male    DOB: 1945/09/23, 68 y.o.   MRN: [id number]  HPI C/o back pain bilateral thoracic region  Review of Systems  Constitutional: Negative for fever.  HENT: Negative for ear pain.   Eyes: Negative for discharge.  Respiratory: Negative for cough.   Cardiovascular: Negative for chest pain.  Gastrointestinal: Negative for abdominal distention.  Endocrine: Negative for polyuria.  Genitourinary: Negative for difficulty urinating.  Musculoskeletal: Negative for gait problem and neck pain.  Skin: Negative for color change and rash.  Neurological: Negative for speech difficulty and headaches.  Psychiatric/Behavioral: Negative for agitation.       Objective:    BP 150/81 mmHg  Pulse 68  Temp(Src) 96.8 F (36 C) (Oral)  Ht 5\' 8"  (1.727 m)  Wt 277 lb (125.646 kg)  BMI 42.13 kg/m2 Physical Exam  TTP bilateral thoracic spine paraspinous muscles      Assessment & Plan:     ICD-9-CM ICD-10-CM   1. Midline low back pain with sciatica, sciatica laterality unspecified 724.3 M54.40 cyclobenzaprine (FLEXERIL) 10 MG tablet     diclofenac sodium (VOLTAREN) 1 % GEL     No Follow-up on file.  [name] FNP

## 2014-04-06 ENCOUNTER — Other Ambulatory Visit: Payer: Self-pay | Admitting: *Deleted

## 2014-04-06 MED ORDER — PANTOPRAZOLE SODIUM 40 MG PO TBEC: 40.0000 mg | DELAYED_RELEASE_TABLET | Freq: Two times a day (BID) | ORAL | Status: DC

## 2014-04-06 MED ORDER — ALLOPURINOL 300 MG PO TABS: 300.0000 mg | ORAL_TABLET | Freq: Every day | ORAL | Status: DC

## 2014-04-14 DIAGNOSIS — N3941 Urge incontinence: Secondary | ICD-10-CM | POA: Diagnosis not present

## 2014-04-14 DIAGNOSIS — C61 Malignant neoplasm of prostate: Secondary | ICD-10-CM | POA: Diagnosis not present

## 2014-04-17 ENCOUNTER — Ambulatory Visit (INDEPENDENT_AMBULATORY_CARE_PROVIDER_SITE_OTHER): Payer: Medicare Other

## 2014-04-17 ENCOUNTER — Encounter: Payer: Self-pay | Admitting: Family Medicine

## 2014-04-17 ENCOUNTER — Ambulatory Visit (INDEPENDENT_AMBULATORY_CARE_PROVIDER_SITE_OTHER): Payer: Medicare Other | Admitting: Family Medicine

## 2014-04-17 ENCOUNTER — Telehealth: Payer: Self-pay | Admitting: Cardiology

## 2014-04-17 VITALS — BP 153/92 | HR 86 | Temp 97.3°F | Ht 68.0 in | Wt 279.0 lb

## 2014-04-17 DIAGNOSIS — M25569 Pain in unspecified knee: Secondary | ICD-10-CM

## 2014-04-17 DIAGNOSIS — E785 Hyperlipidemia, unspecified: Secondary | ICD-10-CM | POA: Diagnosis not present

## 2014-04-17 MED ORDER — CARVEDILOL 25 MG PO TABS: 25.0000 mg | ORAL_TABLET | Freq: Two times a day (BID) | ORAL | Status: DC

## 2014-04-17 NOTE — Telephone Encounter (Signed)
Needs to be established with cardiologist at [facility]

## 2014-04-17 NOTE — Patient Instructions (Signed)

## 2014-04-17 NOTE — Progress Notes (Signed)
Subjective:    Patient ID: [name], male    DOB: 26-Apr-1945, 69 y.o.   MRN: [id number]  HPI get acquainted visit. We spent some time talking about his problems main one is prostate cancer. He had positive lymph node and 40 treatments of radiation therapy. Currently on Lupron injections as well as Casodex. He has gained about 100 pounds during the course of this therapy.  He has a history of gout but allopurinol has prevented flairs in several years  He is also on multiple, 5, classes of blood pressure pills and blood pressure still is not optimally controlled we are attributing that to his weight gain.  He is status post abdominal aortic graft and that is followed yearly with ultrasounds. There are no current symptoms  Patient Active Problem List   Diagnosis Date Noted  . Cramp of both lower extremities 02/23/2014  . Prostate cancer 06/30/2013  . Abdominal aneurysm without mention of rupture 02/19/2013  . Aftercare following surgery of the circulatory system, [place] 02/19/2013  . Need for prophylactic vaccination and inoculation against influenza 02/03/2013  . Cough 01/14/2013  . ANXIETY 01/14/2013  . OBESITY NOS 01/14/2013  . Kidney stone 01/14/2013  . VITAMIN B12 DEFICIENCY 01/14/2013  . TOBACCO ABUSE 01/14/2013  . PTSD 01/14/2013  . COPD (chronic obstructive pulmonary disease)   . OSA (obstructive sleep apnea) 08/14/2012  . RLS (restless legs syndrome) 08/14/2012  . Arthritis of both knees 08/14/2012  . Prediabetes 08/14/2012  . Nephrolithiasis 08/14/2012  . Back pain 07/29/2012  . Aortic atherosclerosis 02/19/2012  . Coronary atherosclerosis of native coronary artery 03/23/2010  . CHRONIC KIDNEY DISEASE STAGE II (MILD) 10/13/2009  . GERD 07/30/2008  . BENIGN PROSTATIC HYPERTROPHY, WITH OBSTRUCTION 02/20/2008  . HYPERLIPIDEMIA 01/31/2007  . GOUT 01/31/2007  . Essential hypertension, benign 01/31/2007   Outpatient Encounter Prescriptions as of 04/17/2014    Medication Sig  . allopurinol (ZYLOPRIM) 300 MG tablet Take 1 tablet (300 mg total) by mouth daily.  [name] amLODipine (NORVASC) 10 MG tablet TAKE ONE TABLET BY MOUTH ONCE DAILY  . bicalutamide (CASODEX) 50 MG tablet Take 50 mg by mouth daily.   . carvedilol (COREG) 25 MG tablet Take 1 tablet (25 mg total) by mouth 2 (two) times daily.  . cloNIDine (CATAPRES) 0.1 MG tablet Take 2 tablets (0.2 mg total) by mouth 2 (two) times daily.  . cyclobenzaprine (FLEXERIL) 10 MG tablet Take 1 tablet (10 mg total) by mouth 3 (three) times daily as needed for muscle spasms.  . diclofenac sodium (VOLTAREN) 1 % GEL Apply 2 g topically 4 (four) times daily.  [name] losartan (COZAAR) 50 MG tablet TAKE ONE TABLET BY MOUTH ONCE DAILY  . oxybutynin (DITROPAN) 5 MG tablet Take 5 mg by mouth daily.   . pantoprazole (PROTONIX) 40 MG tablet Take 1 tablet (40 mg total) by mouth 2 (two) times daily.  . pravastatin (PRAVACHOL) 80 MG tablet TAKE ONE TABLET BY MOUTH ONCE DAILY IN THE EVENING  . spironolactone (ALDACTONE) 25 MG tablet Take 0.5 tablets (12.5 mg total) by mouth daily.  . [DISCONTINUED] carvedilol (COREG) 25 MG tablet Take 1 tablet (25 mg total) by mouth 2 (two) times daily.      Review of Systems  Constitutional: Positive for fatigue and unexpected weight change.  HENT: Negative.   Respiratory: Negative.   Cardiovascular: Negative.   Gastrointestinal: Negative.   Genitourinary: Negative.   Neurological: Negative.        Objective:   Physical Exam  Constitutional: He is oriented to person, place, and time. He appears well-developed and well-nourished. No distress.  HENT:  Head: Normocephalic.  Eyes: Pupils are equal, round, and reactive to light.  Cardiovascular: Normal rate and regular rhythm.   Pulmonary/Chest: Effort normal and breath sounds normal.  Abdominal: Soft. Bowel sounds are normal.  Musculoskeletal: Normal range of motion.  Neurological: He is alert and oriented to person, place, and time.   Skin: Skin is warm.  Psychiatric: He has a normal mood and affect. His behavior is normal.    BP 153/92 mmHg  Pulse 86  Temp(Src) 97.3 F (36.3 C) (Oral)  Ht 5' 8"  (1.727 m)  Wt 279 lb (126.554 kg)  BMI 42.43 kg/m2        Assessment & Plan:  1. Hyperlipemia It has been no urinary have since lipids were monitored so that is ordered - CMP14+EGFR - Lipid panel  2. Knee pain, unspecified laterality He had meniscus repair at an orthopedic office in [place] but is much worse and is now considering knee replacement. He does not want to go back to the same orthopedic office and request referral. I have asked him to see Dr. Wynelle Link when he comes here in February  Wardell Honour MD - Ambulatory referral to Orthopedic Surgery - DG Knee 1-2 Views Right; Future

## 2014-04-18 LAB — CMP14+EGFR
ALT: 12 IU/L (ref 0–44)
AST: 13 IU/L (ref 0–40)
Albumin/Globulin Ratio: 1.8 (ref 1.1–2.5)
Albumin: 4.6 g/dL (ref 3.6–4.8)
Alkaline Phosphatase: 76 IU/L (ref 39–117)
BUN/Creatinine Ratio: 11 (ref 10–22)
BUN: 18 mg/dL (ref 8–27)
CO2: 20 mmol/L (ref 18–29)
Calcium: 9.2 mg/dL (ref 8.6–10.2)
Chloride: 103 mmol/L (ref 97–108)
Creatinine, Ser: 1.63 mg/dL — ABNORMAL HIGH (ref 0.76–1.27)
GFR calc Af Amer: 49 mL/min/{1.73_m2} — ABNORMAL LOW (ref >59)
GFR calc non Af Amer: 42 mL/min/{1.73_m2} — ABNORMAL LOW (ref >59)
Globulin, Total: 2.6 g/dL (ref 1.5–4.5)
Glucose: 97 mg/dL (ref 65–99)
Potassium: 4.1 mmol/L (ref 3.5–5.2)
Sodium: 140 mmol/L (ref 134–144)
Total Bilirubin: 0.3 mg/dL (ref 0.0–1.2)
Total Protein: 7.2 g/dL (ref 6.0–8.5)

## 2014-04-18 LAB — LIPID PANEL
Chol/HDL Ratio: 5.9 ratio units — ABNORMAL HIGH (ref 0.0–5.0)
Cholesterol, Total: 211 mg/dL — ABNORMAL HIGH (ref 100–199)
HDL: 36 mg/dL — ABNORMAL LOW (ref >39)
LDL Calculated: 102 mg/dL — ABNORMAL HIGH (ref 0–99)
Triglycerides: 365 mg/dL — ABNORMAL HIGH (ref 0–149)
VLDL Cholesterol Cal: 73 mg/dL — ABNORMAL HIGH (ref 5–40)

## 2014-04-20 ENCOUNTER — Other Ambulatory Visit: Payer: Self-pay

## 2014-04-20 NOTE — Telephone Encounter (Signed)
Pt given 1 month refill on 04/17/14,needs to establish with cardiologist Haze Rushing to make apt

## 2014-04-23 ENCOUNTER — Encounter: Payer: Self-pay | Admitting: *Deleted

## 2014-04-23 ENCOUNTER — Encounter: Payer: Self-pay | Admitting: Adult Health

## 2014-04-23 ENCOUNTER — Ambulatory Visit (INDEPENDENT_AMBULATORY_CARE_PROVIDER_SITE_OTHER): Payer: Medicare Other | Admitting: Adult Health

## 2014-04-23 VITALS — BP 140/90 | HR 78 | Ht 68.0 in | Wt 279.2 lb

## 2014-04-23 DIAGNOSIS — I10 Essential (primary) hypertension: Secondary | ICD-10-CM

## 2014-04-23 DIAGNOSIS — F17200 Nicotine dependence, unspecified, uncomplicated: Secondary | ICD-10-CM

## 2014-04-23 DIAGNOSIS — Z720 Tobacco use: Secondary | ICD-10-CM | POA: Diagnosis not present

## 2014-04-23 NOTE — Assessment & Plan Note (Signed)
He unfortunately, continues to smoke.  Smoking cessation is recommended

## 2014-04-23 NOTE — Patient Instructions (Addendum)
Your physician recommends that you schedule a follow-up appointment in: 3 months with Dr. [name]  Your physician recommends that you continue on your current medications as directed. Please refer to the Current Medication list given to you today.  You have been cleared for surgery by cardiology   Thank you for choosing [facility]!

## 2014-04-23 NOTE — Progress Notes (Signed)
HPI: [name] is a 69 year old patient of Dr. Mariann Laster, who is here to be established in the [place] office with known history of hypertension, hyperlipidemia,, and recent stent graft repair of a penetrating aortic ulcer on 09/13/2007.  He was last seen by cardiology and February 2012.  At that time.  He was gaining weight, his blood pressure was uncontrolled, and he continued to smoke.  It was uncertain medical compliance as he was unable to verify his medications at that time.   The patient comes today for preoperative evaluation for right knee repair.  In anticipation of this, Dr. [name], ordered an ultrasound of his aorta, which was completed in November of 2015.  This demonstrated suboptimal exam unable to clearly visualize aorta or stent graft, the largest measurement visualized with 3.5 cm distal aorta with limited visualization.  This was read by Dr. Trula Slade.     The patient continues to have difficult control of his blood pressure.  He is morbidly obese and has obstructive sleep apnea, but is noncompliant with CPAP.  He is on multiple medications for blood pressure control.  He has also recently been treated for prostate cancer with radiation therapy.  He is followed by oncology in [place].  He unfortunately continues to smoke a half a pack a day. He denies chest pain, but is mildly short of breath with exertion from his weight and inactivity due to his knees.  Allergies  Allergen Reactions  . Carbidopa-Levodopa     hallunications  . Morphine Sulfate Nausea And Vomiting  . Sulfacetamide Sodium Nausea And Vomiting  . Sulfamethoxazole-Trimethoprim Nausea And Vomiting    Current Outpatient Prescriptions  Medication Sig Dispense Refill  . allopurinol (ZYLOPRIM) 300 MG tablet Take 1 tablet (300 mg total) by mouth daily. 30 tablet 5  . amLODipine (NORVASC) 10 MG tablet TAKE ONE TABLET BY MOUTH ONCE DAILY 30 tablet 3  . bicalutamide (CASODEX) 50 MG tablet Take 50 mg by  mouth daily.     . carvedilol (COREG) 25 MG tablet Take 1 tablet (25 mg total) by mouth 2 (two) times daily. 60 tablet 0  . cloNIDine (CATAPRES) 0.1 MG tablet Take 2 tablets (0.2 mg total) by mouth 2 (two) times daily. 360 tablet 1  . cyclobenzaprine (FLEXERIL) 10 MG tablet Take 1 tablet (10 mg total) by mouth 3 (three) times daily as needed for muscle spasms. 30 tablet 1  . diclofenac sodium (VOLTAREN) 1 % GEL Apply 2 g topically 4 (four) times daily. 100 g 11  . losartan (COZAAR) 50 MG tablet TAKE ONE TABLET BY MOUTH ONCE DAILY 30 tablet 5  . oxybutynin (DITROPAN) 5 MG tablet Take 5 mg by mouth daily.     . pantoprazole (PROTONIX) 40 MG tablet Take 1 tablet (40 mg total) by mouth 2 (two) times daily. 60 tablet 5  . pravastatin (PRAVACHOL) 80 MG tablet TAKE ONE TABLET BY MOUTH ONCE DAILY IN THE EVENING 30 tablet 2  . spironolactone (ALDACTONE) 25 MG tablet Take 0.5 tablets (12.5 mg total) by mouth daily. 15 tablet 1   No current facility-administered medications for this visit.    Past Medical History  Diagnosis Date  . Coronary atherosclerosis of native coronary artery     Mild atherosclerosis at catheterization 2011  . Gout   . Nephrolithiasis   . GERD (gastroesophageal reflux disease)   . Essential hypertension, benign   . Sleep apnea     Intolerant of CPAP  . Aortic atherosclerosis  Penetrating aortic ulcer s/p endograft 2004 - followed by Dr. Trula Slade  . Prostate cancer   . COPD (chronic obstructive pulmonary disease)     "collapsed left lung" years ago also  . Tubular adenoma of colon 08/2008    Past Surgical History  Procedure Laterality Date  . Aortic endograft repair  2004    Dr. [name]  . Colonoscopy w/ polypectomy    . Umbilical hernia repair    . Fracture surgery      Bilateral lower arms as a child  . Knee arthroscopy      Left  . Prostate biopsy  09/26/2011    Procedure: BIOPSY TRANSRECTAL ULTRASONIC PROSTATE (TUBP);  Surgeon: [name], MD;  Location: [facility]  ORS;  Service: Urology;  Laterality: N/A;     . Circumcision  09/26/2011    Procedure: CIRCUMCISION ADULT;  Surgeon: [name], MD;  Location: [facility] ORS;  Service: Urology;  Laterality: N/A;  . Cystoscopy  09/26/2011    Procedure: CYSTOSCOPY;  Surgeon: [name], MD;  Location: [facility];  Service: Urology;  Laterality: N/A;  . Knee surgery  Sept. 26, 2013    Torn minisc.- Right  knee  . Evar    . Endovascular stent insertion  September 13, 2007    EVAR - Aortic stent graft    ROS: Complete review of systems performed and found to be negative unless outlined above PHYSICAL EXAM BP 140/90 mmHg  Pulse 78  Ht 5\' 8"  (1.727 m)  Wt 279 lb 3.2 oz (126.644 kg)  BMI 42.46 kg/m2  SpO2 96% General: Well developed, well nourished, in no acute distress, morbidly obese Head: Eyes PERRLA, No xanthomas.   Normal cephalic and atramatic  Lungs: Clear bilaterally to auscultation and percussion. Heart: HRRR S1 S2, with 1`/6 systolic murmur..  Pulses are 2+ & equal.            No carotid bruit. No JVD.  No abdominal bruits. No femoral bruits. Abdomen: Bowel sounds are positive, abdomen soft and non-tender without masses or                  Hernia's noted. Msk:  Back normal, slow, lumbaring, gait. Normal strength and tone for age. Extremities: No clubbing, cyanosis or edema.  DP +1 Neuro: Alert and oriented X 3. Psych:  Good affect, responds appropriately   EKG:  Normal sinus rhythm, first degree AV block, left anterior fascicular block, rate of 71 beats per minute  ASSESSMENT AND PLAN

## 2014-04-23 NOTE — Assessment & Plan Note (Signed)
The patient is completely asymptomatic from a cardiac standpoint.  Although the aortic ultrasound was suboptimal, there was no significant diameter.  Elevation.  The largest measurement was 3.5 cm in the distal aorta.  The patient did have an echocardiogram completed on 01/07/2013 revealing normal ejection fraction, EF of 60-65%.  He had grade 1 diastolic dysfunction.  Aortic valve revealed mild regurgitation with a mild lead to moderately calcified annulus.  There was no stenosis.  The patient is of acceptable risk to proceed with knee repair, without any further cardiac testing.  He has been advised to make sure that he remains on his antihypertensive medications.  Especially his carvedilol.  We, of course, will be available for any cardiac assistance perioperatively.  With history of COPD and OSA, there may be some difficulties with intubation, and extubation.  Recommend anesthesia review his history thoroughly.

## 2014-04-23 NOTE — Progress Notes (Deleted)
Name: [name]    DOB: 1945-09-07  Age: 69 y.o.  MR#: [id number]       PCP:  Wardell Honour, MD      Insurance: Payor: MEDICARE / Plan: MEDICARE PART A AND B / Product Type: *No Product type* /   CC:    Chief Complaint  Patient presents with  . PAD  . AAA  . Hypertension    VS Filed Vitals:   04/23/14 1502  BP: 140/90  Pulse: 78  Height: 5\' 8"  (1.727 m)  Weight: 279 lb 3.2 oz (126.644 kg)  SpO2: 96%    Weights Current Weight  04/23/14 279 lb 3.2 oz (126.644 kg)  04/17/14 279 lb (126.554 kg)  03/30/14 277 lb (125.646 kg)    Blood Pressure  BP Readings from Last 3 Encounters:  04/23/14 140/90  04/17/14 153/92  03/30/14 150/81     Admit date:  (Not on file) Last encounter with RMR:  Visit date not found   Allergy Carbidopa-levodopa; Morphine sulfate; Sulfacetamide sodium; and Sulfamethoxazole-trimethoprim  Current Outpatient Prescriptions  Medication Sig Dispense Refill  . allopurinol (ZYLOPRIM) 300 MG tablet Take 1 tablet (300 mg total) by mouth daily. 30 tablet 5  . amLODipine (NORVASC) 10 MG tablet TAKE ONE TABLET BY MOUTH ONCE DAILY 30 tablet 3  . bicalutamide (CASODEX) 50 MG tablet Take 50 mg by mouth daily.     . carvedilol (COREG) 25 MG tablet Take 1 tablet (25 mg total) by mouth 2 (two) times daily. 60 tablet 0  . cloNIDine (CATAPRES) 0.1 MG tablet Take 2 tablets (0.2 mg total) by mouth 2 (two) times daily. 360 tablet 1  . cyclobenzaprine (FLEXERIL) 10 MG tablet Take 1 tablet (10 mg total) by mouth 3 (three) times daily as needed for muscle spasms. 30 tablet 1  . diclofenac sodium (VOLTAREN) 1 % GEL Apply 2 g topically 4 (four) times daily. 100 g 11  . losartan (COZAAR) 50 MG tablet TAKE ONE TABLET BY MOUTH ONCE DAILY 30 tablet 5  . oxybutynin (DITROPAN) 5 MG tablet Take 5 mg by mouth daily.     . pantoprazole (PROTONIX) 40 MG tablet Take 1 tablet (40 mg total) by mouth 2 (two) times daily. 60 tablet 5  . pravastatin (PRAVACHOL) 80 MG tablet TAKE ONE  TABLET BY MOUTH ONCE DAILY IN THE EVENING 30 tablet 2  . spironolactone (ALDACTONE) 25 MG tablet Take 0.5 tablets (12.5 mg total) by mouth daily. 15 tablet 1   No current facility-administered medications for this visit.    Discontinued Meds:   There are no discontinued medications.  Patient Active Problem List   Diagnosis Date Noted  . Cramp of both lower extremities 02/23/2014  . Prostate cancer 06/30/2013  . Abdominal aneurysm without mention of rupture 02/19/2013  . Aftercare following surgery of the circulatory system, [place] 02/19/2013  . Need for prophylactic vaccination and inoculation against influenza 02/03/2013  . Cough 01/14/2013  . ANXIETY 01/14/2013  . OBESITY NOS 01/14/2013  . Kidney stone 01/14/2013  . VITAMIN B12 DEFICIENCY 01/14/2013  . TOBACCO ABUSE 01/14/2013  . PTSD 01/14/2013  . COPD (chronic obstructive pulmonary disease)   . OSA (obstructive sleep apnea) 08/14/2012  . RLS (restless legs syndrome) 08/14/2012  . Arthritis of both knees 08/14/2012  . Prediabetes 08/14/2012  . Nephrolithiasis 08/14/2012  . Back pain 07/29/2012  . Aortic atherosclerosis 02/19/2012  . Coronary atherosclerosis of native coronary artery 03/23/2010  . CHRONIC KIDNEY DISEASE STAGE II (MILD) 10/13/2009  .  GERD 07/30/2008  . BENIGN PROSTATIC HYPERTROPHY, WITH OBSTRUCTION 02/20/2008  . HYPERLIPIDEMIA 01/31/2007  . GOUT 01/31/2007  . Essential hypertension, benign 01/31/2007    LABS    Component Value Date/Time   NA 140 04/17/2014 1604   NA 142 10/01/2013 1226   NA 144 09/09/2013 1754   NA 143 07/09/2013 1029   NA 144 03/04/2013 1007   NA 143 10/15/2012 1320   NA 138 05/11/2012 1758   NA 142 04/26/2012 1708   K 4.1 04/17/2014 1604   K 4.5 10/01/2013 1226   K 4.1 09/09/2013 1754   K 3.8 07/09/2013 1029   K 4.1 03/04/2013 1007   CL 103 04/17/2014 1604   CL 106 09/09/2013 1754   CL 103 03/04/2013 1007   CO2 20 04/17/2014 1604   CO2 20* 10/01/2013 1226   CO2 20  09/09/2013 1754   CO2 23 07/09/2013 1029   CO2 23 03/04/2013 1007   GLUCOSE 97 04/17/2014 1604   GLUCOSE 106 10/01/2013 1226   GLUCOSE 123* 09/09/2013 1754   GLUCOSE 106 07/09/2013 1029   GLUCOSE 109* 03/04/2013 1007   GLUCOSE 82 10/15/2012 1320   GLUCOSE 88 05/11/2012 1758   GLUCOSE 97 04/26/2012 1708   BUN 18 04/17/2014 1604   BUN 29.9* 10/01/2013 1226   BUN 26 09/09/2013 1754   BUN 14.6 07/09/2013 1029   BUN 17 03/04/2013 1007   BUN 17 10/15/2012 1320   BUN 24* 05/11/2012 1758   BUN 17 04/26/2012 1708   CREATININE 1.63* 04/17/2014 1604   CREATININE 1.9* 10/01/2013 1226   CREATININE 1.80* 09/09/2013 1754   CREATININE 1.4* 07/09/2013 1029   CREATININE 1.60* 06/11/2013 0936   CREATININE 1.71* 10/15/2012 1320   CALCIUM 9.2 04/17/2014 1604   CALCIUM 9.3 10/01/2013 1226   CALCIUM 8.8 09/09/2013 1754   CALCIUM 9.2 07/09/2013 1029   CALCIUM 9.1 03/04/2013 1007   GFRNONAA 42* 04/17/2014 1604   GFRNONAA 38* 09/09/2013 1754   GFRNONAA 44* 03/04/2013 1007   GFRNONAA 41* 10/15/2012 1320   GFRAA 49* 04/17/2014 1604   GFRAA 44* 09/09/2013 1754   GFRAA 51* 03/04/2013 1007   GFRAA 47* 10/15/2012 1320   CMP     Component Value Date/Time   NA 140 04/17/2014 1604   NA 142 10/01/2013 1226   NA 143 10/15/2012 1320   K 4.1 04/17/2014 1604   K 4.5 10/01/2013 1226   CL 103 04/17/2014 1604   CO2 20 04/17/2014 1604   CO2 20* 10/01/2013 1226   GLUCOSE 97 04/17/2014 1604   GLUCOSE 106 10/01/2013 1226   GLUCOSE 82 10/15/2012 1320   BUN 18 04/17/2014 1604   BUN 29.9* 10/01/2013 1226   BUN 17 10/15/2012 1320   CREATININE 1.63* 04/17/2014 1604   CREATININE 1.9* 10/01/2013 1226   CREATININE 1.71* 10/15/2012 1320   CALCIUM 9.2 04/17/2014 1604   CALCIUM 9.3 10/01/2013 1226   PROT 7.2 04/17/2014 1604   PROT 7.1 10/01/2013 1226   PROT 6.6 10/15/2012 1320   ALBUMIN 4.2 10/01/2013 1226   ALBUMIN 4.2 10/15/2012 1320   AST 13 04/17/2014 1604   AST 15 10/01/2013 1226   ALT 12 04/17/2014  1604   ALT 21 10/01/2013 1226   ALKPHOS 76 04/17/2014 1604   ALKPHOS 70 10/01/2013 1226   BILITOT 0.3 04/17/2014 1604   BILITOT 0.57 10/01/2013 1226   GFRNONAA 42* 04/17/2014 1604   GFRNONAA 41* 10/15/2012 1320   GFRAA 49* 04/17/2014 1604   GFRAA 47* 10/15/2012 1320  Component Value Date/Time   WBC 7.6 10/01/2013 1227   WBC 7.2 07/09/2013 1028   WBC 9.2 10/15/2012 1531   WBC 8.2 05/11/2012 1758   WBC 7.3 04/26/2012 1708   WBC 7.6 10/21/2011 1944   HGB 13.7 10/01/2013 1227   HGB 13.8 07/09/2013 1028   HGB 14.4 10/15/2012 1531   HGB 14.7 05/11/2012 1758   HGB 14.4 04/26/2012 1708   HGB 14.5 10/21/2011 1944   HCT 41.6 10/01/2013 1227   HCT 41.0 07/09/2013 1028   HCT 41.0* 10/15/2012 1531   HCT 42.4 05/11/2012 1758   HCT 41.5 04/26/2012 1708   HCT 42.8 10/21/2011 1944   MCV 89.7 10/01/2013 1227   MCV 88.5 07/09/2013 1028   MCV 87.1 10/15/2012 1531   MCV 88.7 05/11/2012 1758   MCV 88.7 04/26/2012 1708   MCV 87.0 10/21/2011 1944    Lipid Panel     Component Value Date/Time   CHOL 187 11/25/2009 0837   TRIG 365* 04/17/2014 1604   TRIG 201* 10/15/2012 1320   HDL 36* 04/17/2014 1604   HDL 34.10* 11/25/2009 0837   CHOLHDL 5.9* 04/17/2014 1604   CHOLHDL 5 11/25/2009 0837   VLDL 40.4* 11/25/2009 0837   LDLCALC 102* 04/17/2014 1604   LDLCALC 112* 10/15/2012 1320   LDLCALC 73 06/24/2008 2014   LDLDIRECT 132.2 11/25/2009 0837    ABG No results found for: PHART, PCO2ART, PO2ART, HCO3, TCO2, ACIDBASEDEF, O2SAT   Lab Results  Component Value Date   TSH 1.953 10/15/2012   BNP (last 3 results) No results for input(s): PROBNP in the last 8760 hours. Cardiac Panel (last 3 results) No results for input(s): CKTOTAL, CKMB, TROPONINI, RELINDX in the last 72 hours.  Iron/TIBC/Ferritin/ %Sat No results found for: IRON, TIBC, FERRITIN, IRONPCTSAT   EKG Orders placed or performed in visit on 12/31/12  . EKG 12-Lead     Prior Assessment and Plan Problem List as of  04/23/2014      Cardiovascular and Mediastinum   Essential hypertension, benign   Last Assessment & Plan 12/31/2012 Office Visit Written 12/31/2012  1:45 PM by Satira Sark, MD    Current medications review. Patient states this has been difficult to control. Likely complicated by his obesity and uncontrolled sleep apnea.      Coronary atherosclerosis of native coronary artery   Last Assessment & Plan 12/31/2012 Office Visit Written 12/31/2012  1:45 PM by Satira Sark, MD    History of minor, nonobstructive disease documented at cardiac catheterization June 2011. He reports no progressive symptoms since that time, no CHF, ECG is stable. He does have a systolic murmur on examination, and NYHA class II dyspnea which is likely multifactorial. We will obtain an echocardiogram for basic cardiac structural assessment and evaluation of his valvular status, although no other ischemic testing is anticipated at this time. Followup scheduled down the road.      Aortic atherosclerosis   Last Assessment & Plan 12/31/2012 Office Visit Written 12/31/2012  1:46 PM by Satira Sark, MD    History of penetrating aortic ulcer status post endograft repair in 2004. He continues to follow with [facility].      Abdominal aneurysm without mention of rupture     Respiratory   OSA (obstructive sleep apnea)   COPD (chronic obstructive pulmonary disease)     Digestive   GERD     Endocrine   Prediabetes     Musculoskeletal and Integument   Arthritis of both knees  Genitourinary   CHRONIC KIDNEY DISEASE STAGE II (MILD)   BENIGN PROSTATIC HYPERTROPHY, WITH OBSTRUCTION   Nephrolithiasis   Kidney stone   Prostate cancer     Other   HYPERLIPIDEMIA   Last Assessment & Plan 12/31/2012 Office Visit Written 12/31/2012  1:46 PM by Satira Sark, MD    Recent lipid numbers reviewed, LDL 112. He is on high-dose Pravachol.      GOUT   Back pain   RLS (restless legs syndrome)   Cough   ANXIETY   OBESITY  NOS   VITAMIN B12 DEFICIENCY   TOBACCO ABUSE   PTSD   Need for prophylactic vaccination and inoculation against influenza   Aftercare following surgery of the circulatory system, [place]   Cramp of both lower extremities       Imaging: Dg Knee 1-2 Views Right  04/17/2014   CLINICAL DATA:  Right knee pain.  EXAM: RIGHT KNEE - 1-2 VIEW  COMPARISON:  CT 04/19/2012  FINDINGS: Large defect noted in the medial femoral condyle. This was felt on prior CT to represent a subchondral cyst. This is unchanged since prior study. No joint effusion. No acute fracture, subluxation or dislocation. Mid early/minimal spurring in the patellofemoral compartment.  IMPRESSION: Large defect in the right medial femoral condyle, unchanged since prior CT. No acute findings.   Electronically Signed   By: Rolm Baptise M.D.   On: 04/17/2014 17:14

## 2014-04-23 NOTE — Assessment & Plan Note (Signed)
Blood pressure has been difficult to control, and he is on multiple medications.  He does have obstructive sleep apnea, but does not wear CPAP as directed.  He states that the CPAP mask is very claustrophobic to him.  I have advised that he take another look at the various types of masks.  He has not considered.  This.  I have explained to him there different sizes and shapes now that may be accountable for him.  If this does work out for him, his blood pressure may be better controlled, and we may be able to wean some of these medications.  He verbalizes understanding.

## 2014-04-27 ENCOUNTER — Other Ambulatory Visit: Payer: Self-pay | Admitting: Family Medicine

## 2014-04-27 NOTE — Addendum Note (Signed)
Addended by: Levonne Hubert on: 04/27/2014 01:38 PM   Modules accepted: Orders

## 2014-05-08 ENCOUNTER — Other Ambulatory Visit: Payer: Self-pay | Admitting: *Deleted

## 2014-05-08 MED ORDER — SPIRONOLACTONE 25 MG PO TABS: 12.5000 mg | ORAL_TABLET | Freq: Every day | ORAL | Status: DC

## 2014-05-19 ENCOUNTER — Telehealth: Payer: Self-pay | Admitting: Family Medicine

## 2014-05-19 NOTE — Telephone Encounter (Signed)
appt made

## 2014-05-22 ENCOUNTER — Encounter: Payer: Self-pay | Admitting: Family Medicine

## 2014-05-22 ENCOUNTER — Ambulatory Visit (INDEPENDENT_AMBULATORY_CARE_PROVIDER_SITE_OTHER): Payer: Medicare Other | Admitting: Family Medicine

## 2014-05-22 VITALS — BP 141/83 | HR 70 | Temp 97.0°F | Ht 68.0 in | Wt 282.0 lb

## 2014-05-22 DIAGNOSIS — L858 Other specified epidermal thickening: Secondary | ICD-10-CM

## 2014-05-22 DIAGNOSIS — L570 Actinic keratosis: Secondary | ICD-10-CM

## 2014-05-22 NOTE — Progress Notes (Addendum)
Subjective:    Patient ID: [name], male    DOB: [birth year], 69 y.o.   MRN: [id number]  HPI 69 year old gentleman with a nonhealing place on his dorsum of his left hand. There is no pain or drainage.  Patient Active Problem List   Diagnosis Date Noted  . Cramp of both lower extremities 02/23/2014  . Prostate cancer 06/30/2013  . Abdominal aneurysm without mention of rupture 02/19/2013  . Aftercare following surgery of the circulatory system, [place] 02/19/2013  . Need for prophylactic vaccination and inoculation against influenza 02/03/2013  . Cough 01/14/2013  . ANXIETY 01/14/2013  . OBESITY NOS 01/14/2013  . Kidney stone 01/14/2013  . VITAMIN B12 DEFICIENCY 01/14/2013  . TOBACCO ABUSE 01/14/2013  . PTSD 01/14/2013  . COPD (chronic obstructive pulmonary disease)   . OSA (obstructive sleep apnea) 08/14/2012  . RLS (restless legs syndrome) 08/14/2012  . Arthritis of both knees 08/14/2012  . Prediabetes 08/14/2012  . Nephrolithiasis 08/14/2012  . Back pain 07/29/2012  . Aortic atherosclerosis 02/19/2012  . Coronary atherosclerosis of native coronary artery 03/23/2010  . CHRONIC KIDNEY DISEASE STAGE II (MILD) 10/13/2009  . GERD 07/30/2008  . BENIGN PROSTATIC HYPERTROPHY, WITH OBSTRUCTION 02/20/2008  . HYPERLIPIDEMIA 01/31/2007  . GOUT 01/31/2007  . Essential hypertension, benign 01/31/2007   Outpatient Encounter Prescriptions as of 05/22/2014  Medication Sig  . allopurinol (ZYLOPRIM) 300 MG tablet Take 1 tablet (300 mg total) by mouth daily.  [name] amLODipine (NORVASC) 10 MG tablet TAKE ONE TABLET BY MOUTH ONCE DAILY  . bicalutamide (CASODEX) 50 MG tablet Take 50 mg by mouth daily.   . carvedilol (COREG) 25 MG tablet Take 1 tablet (25 mg total) by mouth 2 (two) times daily.  . cloNIDine (CATAPRES) 0.1 MG tablet Take 2 tablets (0.2 mg total) by mouth 2 (two) times daily.  . diclofenac sodium (VOLTAREN) 1 % GEL Apply 2 g topically 4 (four) times daily.  [name] losartan (COZAAR)  50 MG tablet TAKE ONE TABLET BY MOUTH ONCE DAILY  . oxybutynin (DITROPAN) 5 MG tablet Take 5 mg by mouth daily.   . pantoprazole (PROTONIX) 40 MG tablet Take 1 tablet (40 mg total) by mouth 2 (two) times daily.  [name] spironolactone (ALDACTONE) 25 MG tablet Take 0.5 tablets (12.5 mg total) by mouth daily.  . [DISCONTINUED] pravastatin (PRAVACHOL) 80 MG tablet TAKE ONE TABLET BY MOUTH ONCE DAILY IN THE EVENING  . [DISCONTINUED] cyclobenzaprine (FLEXERIL) 10 MG tablet Take 1 tablet (10 mg total) by mouth 3 (three) times daily as needed for muscle spasms.     Review of Systems  Constitutional: Positive for unexpected weight change.  Skin:       Nonhealing lesion dorsum left hand       Objective:   Physical Exam  Skin:  The area in question of pill appears to be a cutaneous horn or some sort of keratosis. We first talked about shave excision but he asked about freezing and since it does not look typical of a skin cancer we went ahead with the application of liquid nitrogen. If in 2 weeks the lesion has not disappeared have asked him to come back for shave excision for tissue analysis by pathology    BP 141/83 mmHg  Pulse 70  Temp(Src) 97 F (36.1 C) (Oral)  Ht 5\' 8"  (1.727 m)  Wt 282 lb (127.914 kg)  BMI 42.89 kg/m2       Assessment & Plan:  1. Keratosis   2. Cutaneous horn As above;  frozen with LN2; in 2 weeks, if not resolved, return for shave excision  Wardell Honour MD

## 2014-06-11 ENCOUNTER — Other Ambulatory Visit: Payer: Self-pay | Admitting: Adult Health

## 2014-06-12 ENCOUNTER — Ambulatory Visit (INDEPENDENT_AMBULATORY_CARE_PROVIDER_SITE_OTHER): Payer: Medicare Other | Admitting: Urology

## 2014-06-12 DIAGNOSIS — N403 Nodular prostate with lower urinary tract symptoms: Secondary | ICD-10-CM | POA: Diagnosis not present

## 2014-06-12 DIAGNOSIS — C61 Malignant neoplasm of prostate: Secondary | ICD-10-CM

## 2014-06-12 DIAGNOSIS — R351 Nocturia: Secondary | ICD-10-CM | POA: Diagnosis not present

## 2014-06-12 DIAGNOSIS — N3941 Urge incontinence: Secondary | ICD-10-CM

## 2014-06-12 DIAGNOSIS — R3915 Urgency of urination: Secondary | ICD-10-CM | POA: Diagnosis not present

## 2014-06-23 ENCOUNTER — Telehealth: Payer: Self-pay | Admitting: *Deleted

## 2014-06-23 MED ORDER — CLONIDINE HCL 0.1 MG PO TABS: 0.2000 mg | ORAL_TABLET | Freq: Two times a day (BID) | ORAL | Status: DC

## 2014-06-23 NOTE — Telephone Encounter (Signed)
done

## 2014-06-30 DIAGNOSIS — M25561 Pain in right knee: Secondary | ICD-10-CM | POA: Diagnosis not present

## 2014-06-30 DIAGNOSIS — M1711 Unilateral primary osteoarthritis, right knee: Secondary | ICD-10-CM | POA: Diagnosis not present

## 2014-07-27 ENCOUNTER — Encounter: Payer: Self-pay | Admitting: Family Medicine

## 2014-07-27 ENCOUNTER — Ambulatory Visit (INDEPENDENT_AMBULATORY_CARE_PROVIDER_SITE_OTHER): Payer: Medicare Other | Admitting: Family Medicine

## 2014-07-27 VITALS — BP 137/76 | HR 57 | Temp 97.0°F | Ht 68.0 in | Wt 287.0 lb

## 2014-07-27 DIAGNOSIS — G629 Polyneuropathy, unspecified: Secondary | ICD-10-CM

## 2014-07-27 LAB — GLUCOSE, POCT (MANUAL RESULT ENTRY): POC Glucose: 97 mg/dl (ref 70–99)

## 2014-07-27 NOTE — Progress Notes (Signed)
Subjective:    Patient ID: [name], male    DOB: 1945/07/22, 69 y.o.   MRN: [id number]  HPI patient presents with bilateral foot pain. Symptoms really sound suspicious for neuropathy but there is no history of diabetes. He would like to be checked for diabetes. He also denies any back pain and leg pain or history of B12 deficiency Chief Complaint  Patient presents with  . Peripheral Neuropathy    bilateral foot pain. Patient complains that they feel cold on the inside but warm to the touch   Patient Active Problem List   Diagnosis Date Noted  . Cramp of both lower extremities 02/23/2014  . Prostate cancer 06/30/2013  . Abdominal aneurysm without mention of rupture 02/19/2013  . Aftercare following surgery of the circulatory system, [place] 02/19/2013  . Need for prophylactic vaccination and inoculation against influenza 02/03/2013  . Cough 01/14/2013  . ANXIETY 01/14/2013  . OBESITY NOS 01/14/2013  . Kidney stone 01/14/2013  . VITAMIN B12 DEFICIENCY 01/14/2013  . TOBACCO ABUSE 01/14/2013  . PTSD 01/14/2013  . COPD (chronic obstructive pulmonary disease)   . OSA (obstructive sleep apnea) 08/14/2012  . RLS (restless legs syndrome) 08/14/2012  . Arthritis of both knees 08/14/2012  . Prediabetes 08/14/2012  . Nephrolithiasis 08/14/2012  . Back pain 07/29/2012  . Aortic atherosclerosis 02/19/2012  . Coronary atherosclerosis of native coronary artery 03/23/2010  . CHRONIC KIDNEY DISEASE STAGE II (MILD) 10/13/2009  . GERD 07/30/2008  . BENIGN PROSTATIC HYPERTROPHY, WITH OBSTRUCTION 02/20/2008  . HYPERLIPIDEMIA 01/31/2007  . GOUT 01/31/2007  . Essential hypertension, benign 01/31/2007   Outpatient Encounter Prescriptions as of 07/27/2014  Medication Sig  . allopurinol (ZYLOPRIM) 300 MG tablet Take 1 tablet (300 mg total) by mouth daily.  [name] amLODipine (NORVASC) 10 MG tablet TAKE ONE TABLET BY MOUTH ONCE DAILY  . bicalutamide (CASODEX) 50 MG tablet Take 50 mg by mouth daily.    . carvedilol (COREG) 25 MG tablet TAKE ONE TABLET BY MOUTH TWICE DAILY  . cloNIDine (CATAPRES) 0.1 MG tablet Take 2 tablets (0.2 mg total) by mouth 2 (two) times daily.  . diclofenac sodium (VOLTAREN) 1 % GEL Apply 2 g topically 4 (four) times daily.  [name] losartan (COZAAR) 50 MG tablet TAKE ONE TABLET BY MOUTH ONCE DAILY  . oxybutynin (DITROPAN) 5 MG tablet Take 5 mg by mouth daily.   . pantoprazole (PROTONIX) 40 MG tablet Take 1 tablet (40 mg total) by mouth 2 (two) times daily.  [name] spironolactone (ALDACTONE) 25 MG tablet Take 0.5 tablets (12.5 mg total) by mouth daily.      Review of Systems  Constitutional: Negative.   Cardiovascular: Negative.   Gastrointestinal: Negative.   Musculoskeletal: Negative.   Skin: Negative.   Psychiatric/Behavioral: Negative.        Objective:   Physical Exam  Constitutional: He appears well-developed and well-nourished.  Cardiovascular: Normal rate and regular rhythm.   Pulmonary/Chest: Effort normal and breath sounds normal.  Musculoskeletal:  Peripheral pulses are symmetric and palpable monofilament skin testing sensation is normal    BP 137/76 mmHg  Pulse 57  Temp(Src) 97 F (36.1 C) (Oral)  Ht 5\' 8"  (1.727 m)  Wt 287 lb (130.182 kg)  BMI 43.65 kg/m2        Assessment & Plan:  1. Peripheral neuropathy No evidence of diabetes with a fasting blood sugar 97. There is nothing to suggest neuropathy secondary to disc disease and history is not consistent with B12 although that has not  been checked I believe if he would stop smoking that his symptoms would at least partially improve and have discussed this with him - POCT glucose (manual entry)

## 2014-08-24 ENCOUNTER — Other Ambulatory Visit: Payer: Self-pay | Admitting: Family Medicine

## 2014-08-25 DIAGNOSIS — C61 Malignant neoplasm of prostate: Secondary | ICD-10-CM | POA: Diagnosis not present

## 2014-09-11 ENCOUNTER — Ambulatory Visit (INDEPENDENT_AMBULATORY_CARE_PROVIDER_SITE_OTHER): Payer: Medicare Other | Admitting: Urology

## 2014-09-11 DIAGNOSIS — R972 Elevated prostate specific antigen [PSA]: Secondary | ICD-10-CM | POA: Diagnosis not present

## 2014-09-11 DIAGNOSIS — R351 Nocturia: Secondary | ICD-10-CM

## 2014-09-11 DIAGNOSIS — C61 Malignant neoplasm of prostate: Secondary | ICD-10-CM | POA: Diagnosis not present

## 2014-09-11 DIAGNOSIS — N3941 Urge incontinence: Secondary | ICD-10-CM | POA: Diagnosis not present

## 2014-09-17 ENCOUNTER — Ambulatory Visit: Payer: Medicare Other | Admitting: Family Medicine

## 2014-09-23 ENCOUNTER — Ambulatory Visit (INDEPENDENT_AMBULATORY_CARE_PROVIDER_SITE_OTHER): Payer: Medicare Other

## 2014-09-23 ENCOUNTER — Ambulatory Visit (INDEPENDENT_AMBULATORY_CARE_PROVIDER_SITE_OTHER): Payer: Medicare Other | Admitting: Family Medicine

## 2014-09-23 ENCOUNTER — Encounter: Payer: Self-pay | Admitting: Family Medicine

## 2014-09-23 VITALS — BP 127/81 | HR 71 | Temp 96.7°F | Ht 68.0 in | Wt 276.0 lb

## 2014-09-23 DIAGNOSIS — R0781 Pleurodynia: Secondary | ICD-10-CM

## 2014-09-23 NOTE — Progress Notes (Signed)
   Subjective:    Patient ID: [name], male    DOB: 1945-04-11, 69 y.o.   MRN: [id number]  HPI  69 year old male who is here with left sided upper chest pain. Pain is actually just below the axilla. There is tenderness in the area. He can recall no injury or trauma. It does not hurt to take a deep breath. He has not seen any rash in the area.  Patient Active Problem List   Diagnosis Date Noted  . Cramp of both lower extremities 02/23/2014  . Prostate cancer 06/30/2013  . Abdominal aneurysm without mention of rupture 02/19/2013  . Aftercare following surgery of the circulatory system, [place] 02/19/2013  . Need for prophylactic vaccination and inoculation against influenza 02/03/2013  . Cough 01/14/2013  . ANXIETY 01/14/2013  . OBESITY NOS 01/14/2013  . Kidney stone 01/14/2013  . VITAMIN B12 DEFICIENCY 01/14/2013  . TOBACCO ABUSE 01/14/2013  . PTSD 01/14/2013  . COPD (chronic obstructive pulmonary disease)   . OSA (obstructive sleep apnea) 08/14/2012  . RLS (restless legs syndrome) 08/14/2012  . Arthritis of both knees 08/14/2012  . Prediabetes 08/14/2012  . Nephrolithiasis 08/14/2012  . Back pain 07/29/2012  . Aortic atherosclerosis 02/19/2012  . Coronary atherosclerosis of native coronary artery 03/23/2010  . CHRONIC KIDNEY DISEASE STAGE II (MILD) 10/13/2009  . GERD 07/30/2008  . BENIGN PROSTATIC HYPERTROPHY, WITH OBSTRUCTION 02/20/2008  . HYPERLIPIDEMIA 01/31/2007  . GOUT 01/31/2007  . Essential hypertension, benign 01/31/2007   Outpatient Encounter Prescriptions as of 09/23/2014  Medication Sig  . allopurinol (ZYLOPRIM) 300 MG tablet Take 1 tablet (300 mg total) by mouth daily.  [name] amLODipine (NORVASC) 10 MG tablet TAKE ONE TABLET BY MOUTH ONCE DAILY  . bicalutamide (CASODEX) 50 MG tablet Take 50 mg by mouth daily.   . carvedilol (COREG) 25 MG tablet TAKE ONE TABLET BY MOUTH TWICE DAILY  . cloNIDine (CATAPRES) 0.1 MG tablet Take 2 tablets (0.2 mg total) by mouth 2  (two) times daily.  . diclofenac sodium (VOLTAREN) 1 % GEL Apply 2 g topically 4 (four) times daily.  [name] losartan (COZAAR) 50 MG tablet TAKE ONE TABLET BY MOUTH ONCE DAILY  . oxybutynin (DITROPAN) 5 MG tablet Take 5 mg by mouth daily.   . pantoprazole (PROTONIX) 40 MG tablet Take 1 tablet (40 mg total) by mouth 2 (two) times daily.  [name] spironolactone (ALDACTONE) 25 MG tablet Take 0.5 tablets (12.5 mg total) by mouth daily.   No facility-administered encounter medications on file as of 09/23/2014.       Review of Systems  Constitutional: Negative.   Respiratory: Negative.   Cardiovascular: Positive for chest pain.       Objective:   Physical Exam  Constitutional: He appears well-developed and well-nourished.  Pulmonary/Chest: Effort normal. He exhibits tenderness.  There is point tenderness just below the axilla. Compression with anterior posterior and or lateral pressure fails to reduce pain. X-ray of the rib shows no fracture or other abnormality.    BP 127/81 mmHg  Pulse 71  Temp(Src) 96.7 F (35.9 C) (Oral)  Ht 5\' 8"  (1.727 m)  Wt 276 lb (125.193 kg)  BMI 41.98 kg/m2       Assessment & Plan:  1. Rib pain Assume contusion to the rib. X-ray is unrevealing. Suggested trial of heat or ice locally whichever seems to afford greatest affect. - DG Ribs Unilateral W/Chest Left; Future

## 2014-10-05 ENCOUNTER — Other Ambulatory Visit: Payer: Self-pay | Admitting: Family Medicine

## 2014-10-12 DIAGNOSIS — M79672 Pain in left foot: Secondary | ICD-10-CM | POA: Diagnosis not present

## 2014-10-12 DIAGNOSIS — M25579 Pain in unspecified ankle and joints of unspecified foot: Secondary | ICD-10-CM | POA: Diagnosis not present

## 2014-10-27 ENCOUNTER — Encounter: Payer: Self-pay | Admitting: Family Medicine

## 2014-10-27 ENCOUNTER — Other Ambulatory Visit: Payer: Self-pay | Admitting: Family Medicine

## 2014-10-27 ENCOUNTER — Encounter (INDEPENDENT_AMBULATORY_CARE_PROVIDER_SITE_OTHER): Payer: Self-pay

## 2014-10-27 ENCOUNTER — Ambulatory Visit (INDEPENDENT_AMBULATORY_CARE_PROVIDER_SITE_OTHER): Payer: Medicare Other | Admitting: Family Medicine

## 2014-10-27 VITALS — BP 118/72 | HR 61 | Temp 97.0°F | Ht 68.0 in | Wt 280.0 lb

## 2014-10-27 DIAGNOSIS — N182 Chronic kidney disease, stage 2 (mild): Secondary | ICD-10-CM

## 2014-10-27 MED ORDER — AMLODIPINE BESYLATE 10 MG PO TABS: 5.0000 mg | ORAL_TABLET | Freq: Every day | ORAL | Status: DC

## 2014-10-27 NOTE — Patient Instructions (Signed)
You may receive a survey either by mail or email. Please take time to complete this as it helps [place] to serve you better.

## 2014-10-27 NOTE — Progress Notes (Signed)
Subjective:    Patient ID: [name], male    DOB: [birth year], 69 y.o.   MRN: [id number]  HPI 69 year old gentleman who presents with dependent edema. There is no history of heart failure but he does have evidence of chronic kidney disease. He also takes amlodipine for blood pressure 10 mg, and Casodex for prostate cancer. Both of these are known to cause edema.  Patient Active Problem List   Diagnosis Date Noted  . Cramp of both lower extremities 02/23/2014  . Prostate cancer 06/30/2013  . Abdominal aneurysm without mention of rupture 02/19/2013  . Aftercare following surgery of the circulatory system, [place] 02/19/2013  . Need for prophylactic vaccination and inoculation against influenza 02/03/2013  . Cough 01/14/2013  . ANXIETY 01/14/2013  . OBESITY NOS 01/14/2013  . Kidney stone 01/14/2013  . VITAMIN B12 DEFICIENCY 01/14/2013  . TOBACCO ABUSE 01/14/2013  . PTSD 01/14/2013  . COPD (chronic obstructive pulmonary disease)   . OSA (obstructive sleep apnea) 08/14/2012  . RLS (restless legs syndrome) 08/14/2012  . Arthritis of both knees 08/14/2012  . Prediabetes 08/14/2012  . Nephrolithiasis 08/14/2012  . Back pain 07/29/2012  . Aortic atherosclerosis 02/19/2012  . Coronary atherosclerosis of native coronary artery 03/23/2010  . CHRONIC KIDNEY DISEASE STAGE II (MILD) 10/13/2009  . GERD 07/30/2008  . BENIGN PROSTATIC HYPERTROPHY, WITH OBSTRUCTION 02/20/2008  . HYPERLIPIDEMIA 01/31/2007  . GOUT 01/31/2007  . Essential hypertension, benign 01/31/2007   Outpatient Encounter Prescriptions as of 10/27/2014  Medication Sig  . allopurinol (ZYLOPRIM) 300 MG tablet TAKE ONE TABLET BY MOUTH ONCE DAILY  . amLODipine (NORVASC) 10 MG tablet TAKE ONE TABLET BY MOUTH ONCE DAILY  . bicalutamide (CASODEX) 50 MG tablet Take 50 mg by mouth daily.   . carvedilol (COREG) 25 MG tablet TAKE ONE TABLET BY MOUTH TWICE DAILY  . cloNIDine (CATAPRES) 0.1 MG tablet Take 2 tablets (0.2 mg total) by  mouth 2 (two) times daily.  . diclofenac sodium (VOLTAREN) 1 % GEL Apply 2 g topically 4 (four) times daily.  [name] gabapentin (NEURONTIN) 100 MG capsule Take 100 mg by mouth daily.  [name] losartan (COZAAR) 50 MG tablet TAKE ONE TABLET BY MOUTH ONCE DAILY  . oxybutynin (DITROPAN) 5 MG tablet Take 5 mg by mouth daily.   . pantoprazole (PROTONIX) 40 MG tablet Take 1 tablet (40 mg total) by mouth 2 (two) times daily.  [name] spironolactone (ALDACTONE) 25 MG tablet Take 0.5 tablets (12.5 mg total) by mouth daily.   No facility-administered encounter medications on file as of 10/27/2014.      Review of Systems  Constitutional: Negative.   Respiratory: Negative.   Cardiovascular: Positive for leg swelling.  Neurological: Negative.   Psychiatric/Behavioral: Negative.        Objective:   Physical Exam  Constitutional: He is oriented to person, place, and time. He appears well-developed and well-nourished.  Cardiovascular: Normal rate, regular rhythm and intact distal pulses.   Pulmonary/Chest: Effort normal and breath sounds normal.  Musculoskeletal: He exhibits edema.  Neurological: He is alert and oriented to person, place, and time.    BP 118/72 mmHg  Pulse 61  Temp(Src) 97 F (36.1 C) (Oral)  Ht $R'5\' 8"'UG$  (1.727 m)  Wt 280 lb (127.007 kg)  BMI 42.58 kg/m2       Assessment & Plan:  1. CHRONIC KIDNEY DISEASE STAGE II (MILD) We'll repeat renal function testing. Have asked him to cut amlodipine from 10 mg to 5 mg and monitor blood pressure pressure  does go up would increase losartan from 50-100 mg. May need to add diarrhetic to current regimen.  Wardell Honour MD - [facility]

## 2014-10-28 LAB — BMP8+EGFR
BUN/Creatinine Ratio: 14 (ref 10–22)
BUN: 27 mg/dL (ref 8–27)
CO2: 19 mmol/L (ref 18–29)
Calcium: 9.1 mg/dL (ref 8.6–10.2)
Chloride: 103 mmol/L (ref 97–108)
Creatinine, Ser: 1.98 mg/dL — ABNORMAL HIGH (ref 0.76–1.27)
GFR calc Af Amer: 39 mL/min/{1.73_m2} — ABNORMAL LOW (ref >59)
GFR calc non Af Amer: 33 mL/min/{1.73_m2} — ABNORMAL LOW (ref >59)
Glucose: 82 mg/dL (ref 65–99)
Potassium: 4.6 mmol/L (ref 3.5–5.2)
Sodium: 140 mmol/L (ref 134–144)

## 2014-10-29 ENCOUNTER — Telehealth: Payer: Self-pay | Admitting: Family Medicine

## 2014-10-29 NOTE — Telephone Encounter (Signed)
Pt aware, notes under lab results

## 2014-11-02 ENCOUNTER — Telehealth: Payer: Self-pay | Admitting: Family Medicine

## 2014-11-02 MED ORDER — AMLODIPINE BESYLATE 10 MG PO TABS: 5.0000 mg | ORAL_TABLET | Freq: Every day | ORAL | Status: DC

## 2014-11-02 NOTE — Telephone Encounter (Signed)
done

## 2014-11-19 ENCOUNTER — Ambulatory Visit (INDEPENDENT_AMBULATORY_CARE_PROVIDER_SITE_OTHER): Payer: Medicare Other | Admitting: Cardiology

## 2014-11-19 ENCOUNTER — Encounter: Payer: Self-pay | Admitting: Cardiology

## 2014-11-19 VITALS — BP 140/90 | HR 64 | Ht 68.0 in | Wt 260.0 lb

## 2014-11-19 DIAGNOSIS — Z01810 Encounter for preprocedural cardiovascular examination: Secondary | ICD-10-CM

## 2014-11-19 DIAGNOSIS — I700 Atherosclerosis of aorta: Secondary | ICD-10-CM | POA: Diagnosis not present

## 2014-11-19 DIAGNOSIS — Z01818 Encounter for other preprocedural examination: Secondary | ICD-10-CM | POA: Diagnosis not present

## 2014-11-19 DIAGNOSIS — I10 Essential (primary) hypertension: Secondary | ICD-10-CM

## 2014-11-19 DIAGNOSIS — I2510 Atherosclerotic heart disease of native coronary artery without angina pectoris: Secondary | ICD-10-CM

## 2014-11-19 NOTE — Patient Instructions (Signed)

## 2014-11-19 NOTE — Progress Notes (Signed)
.    Cardiology Office Note  Date: 11/19/2014   ID: [name], DOB 26-Aug-1945, MRN [id number]  PCP: Wardell Honour, MD  Primary Cardiologist: Rozann Lesches, MD   Chief Complaint  Patient presents with  . Coronary Artery Disease  . Preoperative evaluation   History of Present Illness: [name] is a 69 y.o. male last seen in September 2014. He presents today for a follow-up visit, and also for preoperative evaluation. He is being considered for partial right knee replacement by Dr. Wynelle Link, presumably under general anesthesia. He reports having progressive arthritic pains and ambulatory limitation over the last few years.  From a cardiac perspective he denies any significant angina symptoms, reports NYHA class II dyspnea with typical activities. He takes care of his own house, does some limited yardwork, describes activities exceeding 4 METs in general. His follow-up ECG is reviewed below.  Cardiac catheterization in June 2011 demonstrated minimal, nonobstructive CAD. He also had a follow-up echocardiogram in 2014 demonstrating normal LVEF, no major clinically significant valvular abnormalities.  He has a history of penetrating aortic ulcer in 2004 managed with an endograft and seen in the past by Dr. Trula Slade. Also COPD followed by his primary care provider Dr. Sabra Heck.   Past Medical History  Diagnosis Date  . Coronary atherosclerosis of native coronary artery     Mild atherosclerosis at catheterization 2011  . Gout   . Nephrolithiasis   . GERD (gastroesophageal reflux disease)   . Essential hypertension, benign   . Sleep apnea     Intolerant of CPAP  . Aortic atherosclerosis     Penetrating aortic ulcer s/p endograft 2004 - followed by Dr. Trula Slade  . Prostate cancer   . COPD (chronic obstructive pulmonary disease)   . Tubular adenoma of colon 08/2008    Past Surgical History  Procedure Laterality Date  . Aortic endograft repair  2004    Dr. [name]  .  Colonoscopy w/ polypectomy    . Umbilical hernia repair    . Fracture surgery      Bilateral lower arms as a child  . Knee arthroscopy      Left  . Prostate biopsy  09/26/2011    Procedure: BIOPSY TRANSRECTAL ULTRASONIC PROSTATE (TUBP);  Surgeon: [name], MD;  Location: [facility];  Service: Urology;  Laterality: N/A;     . Circumcision  09/26/2011    Procedure: CIRCUMCISION ADULT;  Surgeon: [name], MD;  Location: [facility];  Service: Urology;  Laterality: N/A;  . Cystoscopy  09/26/2011    Procedure: CYSTOSCOPY;  Surgeon: [name], MD;  Location: [facility];  Service: Urology;  Laterality: N/A;  . Knee surgery  Sept. 26, 2013    Torn minisc.- Right  knee  . Evar    . Endovascular stent insertion  September 13, 2007    EVAR - Aortic stent graft    Current Outpatient Prescriptions  Medication Sig Dispense Refill  . allopurinol (ZYLOPRIM) 300 MG tablet TAKE ONE TABLET BY MOUTH ONCE DAILY 30 tablet 1  . amLODipine (NORVASC) 10 MG tablet Take 0.5 tablets (5 mg total) by mouth daily. 30 tablet 5  . bicalutamide (CASODEX) 50 MG tablet Take 50 mg by mouth daily.     . carvedilol (COREG) 25 MG tablet TAKE ONE TABLET BY MOUTH TWICE DAILY 180 tablet 3  . cloNIDine (CATAPRES) 0.1 MG tablet Take 2 tablets (0.2 mg total) by mouth 2 (two) times daily. 360 tablet 1  .  diclofenac sodium (VOLTAREN) 1 % GEL Apply 2 g topically 4 (four) times daily. 100 g 11  . losartan (COZAAR) 50 MG tablet TAKE ONE TABLET BY MOUTH ONCE DAILY 30 tablet 4  . oxybutynin (DITROPAN) 5 MG tablet Take 5 mg by mouth daily.     . pantoprazole (PROTONIX) 40 MG tablet Take 1 tablet (40 mg total) by mouth 2 (two) times daily. 60 tablet 5  . spironolactone (ALDACTONE) 25 MG tablet TAKE ONE-HALF TABLET BY MOUTH ONCE DAILY 15 tablet 5   No current facility-administered medications for this visit.    Allergies:  Carbidopa-levodopa; Morphine sulfate; Sulfacetamide sodium; and Sulfamethoxazole-trimethoprim   Social History: The  patient  reports that he has been smoking Cigarettes.  He has a 21 pack-year smoking history. He has never used smokeless tobacco. He reports that he does not drink alcohol or use illicit drugs.   ROS:  Please see the history of present illness. Otherwise, complete review of systems is positive for progressive arthritic knee pain and ambulatory limitations.  All other systems are reviewed and negative.   Physical Exam: VS:  BP 140/90 mmHg  Pulse 64  Ht 5\' 8"  (1.727 m)  Wt 260 lb (117.935 kg)  BMI 39.54 kg/m2  SpO2 97%, BMI Body mass index is 39.54 kg/(m^2).  Wt Readings from Last 3 Encounters:  11/19/14 260 lb (117.935 kg)  10/27/14 280 lb (127.007 kg)  09/23/14 276 lb (125.193 kg)     Obese male, appears comfortable at rest. HEENT: Conjunctiva and lids normal, oropharynx clear. Neck: Supple, no elevated JVP or carotid bruits, no thyromegaly. Lungs: Clear to auscultation, nonlabored breathing at rest. Cardiac: Regular rate and rhythm, no S3, 2/6 systolic murmur, no pericardial rub. Abdomen: Soft, nontender, obese, bowel sounds present, no guarding or rebound. Extremities: Trace edema, distal pulses 2+. Skin: Warm and dry. Musculoskeletal: No kyphosis. Neuropsychiatric: Alert and oriented x3, affect grossly appropriate.   ECG: ECG is ordered today and shows sinus rhythm with prolonged PR interval and leftward axis.   Recent Labwork: 04/17/2014: ALT 12; AST 13 10/27/2014: BUN 27; Creatinine, Ser 1.98*; Potassium 4.6; Sodium 140     Component Value Date/Time   CHOL 211* 04/17/2014 1604   CHOL 188 10/15/2012 1320   CHOL 187 11/25/2009 0837   TRIG 365* 04/17/2014 1604   TRIG 201* 10/15/2012 1320   HDL 36* 04/17/2014 1604   HDL 36* 10/15/2012 1320   HDL 34.10* 11/25/2009 0837   CHOLHDL 5.9* 04/17/2014 1604   CHOLHDL 5 11/25/2009 0837   VLDL 40.4* 11/25/2009 0837   LDLCALC 102* 04/17/2014 1604   LDLCALC 112* 10/15/2012 1320   LDLCALC 73 06/24/2008 2014   LDLDIRECT 132.2  11/25/2009 0837    Other Studies Reviewed Today:  Echocardiogram 01/07/2013: Study Conclusions  - Study data: Technically difficult study - Left ventricle: The cavity size was normal. Wall thickness was normal. Systolic function was normal. The estimated ejection fraction was in the range of 60% to 65%. Doppler parameters are consistent with abnormal left ventricular relaxation (grade 1 diastolic dysfunction). - Aortic valve: Mild regurgitation. Valve area: 1.88cm^2(VTI). Valve area: 1.89cm^2 (Vmax). - Mitral valve: Mildly to moderately calcified annulus. Mildly thickened leaflets . - Left atrium: The atrium was mildly dilated.   Assessment and Plan:  1. Preoperative evaluation in an a 69 year old male with history of mild coronary atherosclerosis as of 2011, normal LVEF, mild aortic regurgitation which is asymptomatic, and other comorbidities as detailed above. From a cardiac perspective he has been stable,  no active angina, describes activities exceeding 4 METs at baseline although is limited by progressive knee pain. His ECG is nonspecific. At this point do not anticipate any further cardiac testing prior to anticipated partial right knee replacement. We will continue cardiac follow-up.  2. History of COPD, followed by Dr. Sabra Heck. I do not have details about severity.  3. Prior history of penetrating aortic ulcer status post endograft repair in 2004 by Dr. [name]. Patient was last seen by Dr. Trula Slade for follow-up.  4. Hyperlipidemia, LDL 102 in January, on Pravachol.  5. Essential hypertension, blood pressure is mildly elevated today. Continue current regimen and keep follow-up with Dr. Sabra Heck.  Current medicines were reviewed with the patient today.   Orders Placed This Encounter  Procedures  . EKG 12-Lead    Disposition: FU with me in 1 year.   Signed, Satira Sark, MD, [facility] 11/19/2014 10:27 AM    [place] [facility] [place], [place], [place] [address] Phone: [phone]; Fax: [phone]

## 2014-11-30 DIAGNOSIS — M79672 Pain in left foot: Secondary | ICD-10-CM | POA: Diagnosis not present

## 2014-11-30 DIAGNOSIS — M25579 Pain in unspecified ankle and joints of unspecified foot: Secondary | ICD-10-CM | POA: Diagnosis not present

## 2014-11-30 DIAGNOSIS — C61 Malignant neoplasm of prostate: Secondary | ICD-10-CM | POA: Diagnosis not present

## 2014-11-30 DIAGNOSIS — M79671 Pain in right foot: Secondary | ICD-10-CM | POA: Diagnosis not present

## 2014-11-30 DIAGNOSIS — G600 Hereditary motor and sensory neuropathy: Secondary | ICD-10-CM | POA: Diagnosis not present

## 2014-12-05 ENCOUNTER — Other Ambulatory Visit: Payer: Self-pay | Admitting: Family Medicine

## 2014-12-10 DIAGNOSIS — M1711 Unilateral primary osteoarthritis, right knee: Secondary | ICD-10-CM | POA: Diagnosis not present

## 2014-12-11 ENCOUNTER — Other Ambulatory Visit: Payer: Self-pay | Admitting: Orthopedic Surgery

## 2014-12-11 ENCOUNTER — Ambulatory Visit (INDEPENDENT_AMBULATORY_CARE_PROVIDER_SITE_OTHER): Payer: Medicare Other | Admitting: Urology

## 2014-12-11 DIAGNOSIS — N403 Nodular prostate with lower urinary tract symptoms: Secondary | ICD-10-CM

## 2014-12-11 DIAGNOSIS — N3941 Urge incontinence: Secondary | ICD-10-CM

## 2014-12-11 DIAGNOSIS — C61 Malignant neoplasm of prostate: Secondary | ICD-10-CM | POA: Diagnosis not present

## 2014-12-11 DIAGNOSIS — M25561 Pain in right knee: Secondary | ICD-10-CM

## 2014-12-16 ENCOUNTER — Ambulatory Visit
Admission: RE | Admit: 2014-12-16 | Discharge: 2014-12-16 | Disposition: A | Discharge status: Home / Self Care | Payer: Medicare Other | Source: Ambulatory Visit | Attending: Orthopedic Surgery | Admitting: Orthopedic Surgery

## 2014-12-16 DIAGNOSIS — M25561 Pain in right knee: Secondary | ICD-10-CM | POA: Diagnosis not present

## 2014-12-16 DIAGNOSIS — Z96651 Presence of right artificial knee joint: Secondary | ICD-10-CM | POA: Diagnosis not present

## 2014-12-16 DIAGNOSIS — Z01818 Encounter for other preprocedural examination: Secondary | ICD-10-CM | POA: Diagnosis not present

## 2014-12-30 ENCOUNTER — Encounter: Payer: Self-pay | Admitting: Pediatrics

## 2014-12-30 ENCOUNTER — Ambulatory Visit (INDEPENDENT_AMBULATORY_CARE_PROVIDER_SITE_OTHER): Payer: Medicare Other | Admitting: Pediatrics

## 2014-12-30 VITALS — Temp 97.1°F | Ht 68.0 in | Wt 278.8 lb

## 2014-12-30 DIAGNOSIS — E785 Hyperlipidemia, unspecified: Secondary | ICD-10-CM

## 2014-12-30 DIAGNOSIS — Z0000 Encounter for general adult medical examination without abnormal findings: Secondary | ICD-10-CM | POA: Diagnosis not present

## 2014-12-30 DIAGNOSIS — R030 Elevated blood-pressure reading, without diagnosis of hypertension: Secondary | ICD-10-CM | POA: Diagnosis not present

## 2014-12-30 DIAGNOSIS — I2510 Atherosclerotic heart disease of native coronary artery without angina pectoris: Secondary | ICD-10-CM

## 2014-12-30 DIAGNOSIS — IMO0001 Reserved for inherently not codable concepts without codable children: Secondary | ICD-10-CM

## 2014-12-30 DIAGNOSIS — Z23 Encounter for immunization: Secondary | ICD-10-CM

## 2014-12-30 NOTE — Patient Instructions (Signed)
Water with meals Small glasses of pepsi Fill glass with ice Vegetables with every meal

## 2014-12-30 NOTE — Progress Notes (Signed)
Subjective:   [name] is a 69 y.o. male who presents for a Medicare Annual Wellness Visit.  Prostate cancer, finished radiation 5 mo ago, Went through 40 days of radiation, gets a shot of lupron every 6 months. Followed by urology.   Knee replacment in January planned.  Went to cardiologist recently  BP high today, checks [facility], is usually 110s/80s  Review of Systems  All systems negative other than what is in HPI.     Current Medications (verified) Outpatient Encounter Prescriptions as of 12/30/2014  Medication Sig  . allopurinol (ZYLOPRIM) 300 MG tablet TAKE ONE TABLET BY MOUTH ONCE DAILY  . amLODipine (NORVASC) 10 MG tablet Take 0.5 tablets (5 mg total) by mouth daily.  . bicalutamide (CASODEX) 50 MG tablet Take 50 mg by mouth daily.   . carvedilol (COREG) 25 MG tablet TAKE ONE TABLET BY MOUTH TWICE DAILY  . cloNIDine (CATAPRES) 0.1 MG tablet Take 2 tablets (0.2 mg total) by mouth 2 (two) times daily.  . diclofenac sodium (VOLTAREN) 1 % GEL Apply 2 g topically 4 (four) times daily.  [name] losartan (COZAAR) 50 MG tablet TAKE ONE TABLET BY MOUTH ONCE DAILY  . oxybutynin (DITROPAN) 5 MG tablet Take 5 mg by mouth daily.   . pantoprazole (PROTONIX) 40 MG tablet Take 1 tablet (40 mg total) by mouth 2 (two) times daily.  [name] spironolactone (ALDACTONE) 25 MG tablet TAKE ONE-HALF TABLET BY MOUTH ONCE DAILY   No facility-administered encounter medications on file as of 12/30/2014.    Allergies (verified) Carbidopa-levodopa; Morphine sulfate; Sulfacetamide sodium; and Sulfamethoxazole-trimethoprim   History: Past Medical History  Diagnosis Date  . Coronary atherosclerosis of native coronary artery     Mild atherosclerosis at catheterization 2011  . Gout   . Nephrolithiasis   . GERD (gastroesophageal reflux disease)   . Essential hypertension, benign   . Sleep apnea     Intolerant of CPAP  . Aortic atherosclerosis     Penetrating aortic ulcer s/p endograft 2004 -  followed by Dr. Trula Slade  . Prostate cancer   . COPD (chronic obstructive pulmonary disease)   . Tubular adenoma of colon 08/2008   Past Surgical History  Procedure Laterality Date  . Aortic endograft repair  2004    Dr. [name]  . Colonoscopy w/ polypectomy    . Umbilical hernia repair    . Fracture surgery      Bilateral lower arms as a child  . Knee arthroscopy      Left  . Prostate biopsy  09/26/2011    Procedure: BIOPSY TRANSRECTAL ULTRASONIC PROSTATE (TUBP);  Surgeon: [name], MD;  Location: [facility];  Service: Urology;  Laterality: N/A;     . Circumcision  09/26/2011    Procedure: CIRCUMCISION ADULT;  Surgeon: [name], MD;  Location: [facility];  Service: Urology;  Laterality: N/A;  . Cystoscopy  09/26/2011    Procedure: CYSTOSCOPY;  Surgeon: [name], MD;  Location: [facility];  Service: Urology;  Laterality: N/A;  . Knee surgery  Sept. 26, 2013    Torn minisc.- Right  knee  . Evar    . Endovascular stent insertion  September 13, 2007    EVAR - Aortic stent graft   Family History  Problem Relation Age of Onset  . Stroke Mother   . Mesothelioma Father   . Cancer Father   . Hyperlipidemia Sister   . Heart attack Sister   . Cancer Brother 52  .  Heart disease Brother   . Hyperlipidemia Brother    Social History   Occupational History  . Not on file.   Social History Main Topics  . Smoking status: Current Every Day Smoker -- 0.50 packs/day for 42 years    Types: Cigarettes  . Smokeless tobacco: Never Used  . Alcohol Use: No  . Drug Use: No  . Sexual Activity: Not on file    Do you feel safe at home?  Yes  Dietary issues and exercise activities discussed: Current Exercise Habits:: The patient does not participate in regular exercise at present  Current Dietary habits:  Drinking 2L of pepsi a day.   Cardiac Risk Factors include: male gender;obesity (BMI >30kg/m2);sedentary lifestyle;hypertension;smoking/ tobacco exposure;advanced age (>27men, >38  women)  Objective:    Today's Vitals   12/30/14 1136  Temp: 97.1 F (36.2 C)  TempSrc: Oral  Height: 5\' 8"  (1.727 m)  Weight: 278 lb 12.8 oz (126.463 kg)   Body mass index is 42.4 kg/(m^2).   Activities of Daily Living In your present state of health, do you have any difficulty performing the following activities: 12/30/2014  Hearing? N  Vision? N  Difficulty concentrating or making decisions? N  Walking or climbing stairs? N  Dressing or bathing? N  Doing errands, shopping? N  Preparing Food and eating ? N  Using the Toilet? N  In the past six months, have you accidently leaked urine? Y  Do you have problems with loss of bowel control? N  Managing your Medications? N  Managing your Finances? N  Housekeeping or managing your Housekeeping? N    Are there smokers in your home (other than you)? Yes, pt a smoker. Tried to quit a few years ago. Now 15 cigarettes    Depression Screen PHQ 2/9 Scores 12/30/2014 09/23/2014 08/08/2013 05/05/2013  PHQ - 2 Score 0 2 0 0    Fall Risk Fall Risk  12/30/2014 09/23/2014 08/08/2013 05/05/2013  Falls in the past year? No No No Yes  Number falls in past yr: - - - 2 or more    Cognitive Function: MMSE - Mini Mental State Exam 12/30/2014  Orientation to time 5  Orientation to Place 5  Registration 3  Attention/ Calculation 4  Recall 3  Language- name 2 objects 2  Language- repeat 1  Language- follow 3 step command 3  Language- read & follow direction 1  Write a sentence 1  Copy design 1  Total score 29    Immunizations and Health Maintenance Immunization History  Administered Date(s) Administered  . Influenza Whole 01/31/2007, 01/20/2008  . Influenza,inj,Quad PF,36+ Mos 02/03/2013  . Influenza-Unspecified 01/14/2014   Health Maintenance Due  Topic Date Due  . Hepatitis C Screening  06-29-1945  . TETANUS/TDAP  02/06/1964  . ZOSTAVAX  02/05/2005  . PNA vac Low Risk Adult (1 of 2 - PCV13) 02/05/2010  . INFLUENZA VACCINE  11/02/2014     Patient Care Team: Wardell Honour, MD as PCP - General (Family Medicine) Satira Sark, MD as Consulting Physician (Cardiology)  Indicate any recent Medical Services you may have received from other than [facility] providers in the past year (date may be approximate).    Assessment:    Annual Wellness Visit    Screening Tests Health Maintenance  Topic Date Due  . Hepatitis C Screening  [birth year]  . TETANUS/TDAP  02/06/1964  . ZOSTAVAX  02/05/2005  . PNA vac Low Risk Adult (1 of 2 - PCV13) 02/05/2010  .  INFLUENZA VACCINE  11/02/2014  . COLONOSCOPY  08/17/2018        Plan:   During the course of the visit Tobechukwu was educated and counseled about the following appropriate screening and preventive services:   Vaccines to include Pneumoccal, Influenza today  Colorectal cancer screening--fecal occult cards given, due for colonoscopy, was supposed to have 3 yr repeat due to polyps after last colonoscopy in 2010 but pt does not want to do colonoscopy now given recent prostate ca treatment  Cardiovascular disease screening--sent lipid panel today, not on a statin with a h/o CAD  Diabetes screening--normal 2 months ago  Bone Denisty / Osteoporosis Screening  Nutrition counseling--completed, discussed decreasing pepsi, more vegetables, portion control  Smoking cessation counseling--pt pre-contemplative. Interested in quitting but not ready to try. Has quit for 6 mo in the past. Declined any other assistance at this time.   Goals    . Weight < 180 lb (81.647 kg)        Patient Instructions (the written plan) were given to the patient.   Eustaquio Maize, MD   12/30/2014

## 2014-12-31 LAB — LIPID PANEL
Chol/HDL Ratio: 5.9 ratio units — ABNORMAL HIGH (ref 0.0–5.0)
Cholesterol, Total: 225 mg/dL — ABNORMAL HIGH (ref 100–199)
HDL: 38 mg/dL — ABNORMAL LOW (ref >39)
LDL Calculated: 154 mg/dL — ABNORMAL HIGH (ref 0–99)
Triglycerides: 165 mg/dL — ABNORMAL HIGH (ref 0–149)
VLDL Cholesterol Cal: 33 mg/dL (ref 5–40)

## 2015-01-01 MED ORDER — ATORVASTATIN CALCIUM 40 MG PO TABS: 40.0000 mg | ORAL_TABLET | Freq: Every day | ORAL | Status: DC

## 2015-01-01 NOTE — Addendum Note (Signed)
Addended by: Eustaquio Maize on: 01/01/2015 09:23 AM   Modules accepted: Orders

## 2015-01-04 ENCOUNTER — Other Ambulatory Visit: Payer: Self-pay | Admitting: Family Medicine

## 2015-01-27 ENCOUNTER — Other Ambulatory Visit: Payer: Self-pay | Admitting: Family Medicine

## 2015-02-07 ENCOUNTER — Other Ambulatory Visit: Payer: Self-pay | Admitting: Family Medicine

## 2015-02-26 ENCOUNTER — Other Ambulatory Visit: Payer: Self-pay

## 2015-02-26 MED ORDER — LOSARTAN POTASSIUM 50 MG PO TABS: 50.0000 mg | ORAL_TABLET | Freq: Every day | ORAL | Status: DC

## 2015-03-08 ENCOUNTER — Other Ambulatory Visit: Payer: Self-pay | Admitting: Family Medicine

## 2015-03-09 DIAGNOSIS — C61 Malignant neoplasm of prostate: Secondary | ICD-10-CM | POA: Diagnosis not present

## 2015-03-12 ENCOUNTER — Ambulatory Visit (INDEPENDENT_AMBULATORY_CARE_PROVIDER_SITE_OTHER): Payer: Medicare Other | Admitting: Urology

## 2015-03-12 DIAGNOSIS — B372 Candidiasis of skin and nail: Secondary | ICD-10-CM | POA: Diagnosis not present

## 2015-03-12 DIAGNOSIS — N3941 Urge incontinence: Secondary | ICD-10-CM

## 2015-03-12 DIAGNOSIS — R351 Nocturia: Secondary | ICD-10-CM | POA: Diagnosis not present

## 2015-03-12 DIAGNOSIS — N5201 Erectile dysfunction due to arterial insufficiency: Secondary | ICD-10-CM | POA: Diagnosis not present

## 2015-03-12 DIAGNOSIS — C61 Malignant neoplasm of prostate: Secondary | ICD-10-CM | POA: Diagnosis not present

## 2015-03-17 ENCOUNTER — Ambulatory Visit (INDEPENDENT_AMBULATORY_CARE_PROVIDER_SITE_OTHER): Payer: Medicare Other | Admitting: Pediatrics

## 2015-03-17 ENCOUNTER — Encounter: Payer: Self-pay | Admitting: Pediatrics

## 2015-03-17 VITALS — BP 163/96 | HR 63 | Temp 97.0°F | Ht 68.0 in | Wt 282.0 lb

## 2015-03-17 DIAGNOSIS — L219 Seborrheic dermatitis, unspecified: Secondary | ICD-10-CM | POA: Diagnosis not present

## 2015-03-17 DIAGNOSIS — I2510 Atherosclerotic heart disease of native coronary artery without angina pectoris: Secondary | ICD-10-CM | POA: Diagnosis not present

## 2015-03-17 DIAGNOSIS — N189 Chronic kidney disease, unspecified: Secondary | ICD-10-CM | POA: Diagnosis not present

## 2015-03-17 DIAGNOSIS — F17200 Nicotine dependence, unspecified, uncomplicated: Secondary | ICD-10-CM | POA: Diagnosis not present

## 2015-03-17 DIAGNOSIS — Z01818 Encounter for other preprocedural examination: Secondary | ICD-10-CM

## 2015-03-17 DIAGNOSIS — I10 Essential (primary) hypertension: Secondary | ICD-10-CM

## 2015-03-17 DIAGNOSIS — G629 Polyneuropathy, unspecified: Secondary | ICD-10-CM | POA: Diagnosis not present

## 2015-03-17 MED ORDER — GABAPENTIN 300 MG PO CAPS: 300.0000 mg | ORAL_CAPSULE | Freq: Three times a day (TID) | ORAL | Status: DC

## 2015-03-17 MED ORDER — KETOCONAZOLE 2 % EX CREA: 1.0000 "application " | TOPICAL_CREAM | Freq: Every day | CUTANEOUS | Status: DC

## 2015-03-17 NOTE — Progress Notes (Signed)
Subjective:    Patient ID: [name], male    DOB: 1946-01-05, 69 y.o.   MRN: [id number]  CC: [facility]   HPI: [name] is a 69 y.o. male presenting for [facility] for COPD.  Having a knee replacement surgery done in the next month. Has already gotten clearance from his cardiologist Doesn't remember ever having any COPD exacerbations Feels his breathing is doing fine, doesn take any medicine regularly for his COPD. He continues to smoke, is interested in quitting tobacco but not ready to do it now He does not take any medicines regularly for COPD He has OSA but was not able to tolerate the face mask years ago when it was tried Has never tried a nasal CPAP  No headaches, no vision changes, No SOB or CP with exertion.   Depression screen [facility] 2/9 03/17/2015 12/30/2014 09/23/2014 08/08/2013 05/05/2013  Decreased Interest 0 0 1 0 0  Down, Depressed, Hopeless 0 0 1 0 0  PHQ - 2 Score 0 0 2 0 0     Relevant past medical, surgical, family and social history reviewed and updated as indicated. Interim medical history since our last visit reviewed. Allergies and medications reviewed and updated.  ROS: All systems negative other than what is in the HPI  History  Smoking status  . Current Every Day Smoker -- 0.50 packs/day for 42 years  . Types: Cigarettes  Smokeless tobacco  . Never Used       Objective:    BP 163/96 mmHg  Pulse 63  Temp(Src) 97 F (36.1 C) (Oral)  Ht 5' 8"  (1.727 m)  Wt 282 lb (127.914 kg)  BMI 42.89 kg/m2  Wt Readings from Last 3 Encounters:  03/17/15 282 lb (127.914 kg)  12/30/14 278 lb 12.8 oz (126.463 kg)  11/19/14 260 lb (117.935 kg)     Gen: NAD, alert, cooperative with exam, NCAT EYES: EOMI, no scleral injection or icterus ENT:  TMs pearly gray b/l, OP without erythema LYMPH: no cervical LAD CV: NRRR, normal S1/S2, no murmur, distal pulses 2+ b/l Resp: CTABL, no wheezes, normal WOB Abd: +BS, soft, NTND. no  guarding or organomegaly Ext: No edema, warm Neuro: Alert and oriented, strength equal b/l UE and LE, coordination grossly normal MSK: normal muscle bulk Skin: skin tag over L ear, soft. Red slightly inflamed and tender skin in crease behind R ear.      Assessment & Plan:    Hilario was seen today for surgical clearance, multiple med problem f/u.  Diagnoses and all orders for this visit:  Pre-op evaluation for COPD. Symptoms well controlled at this time, never had prior exacerbations. Pt does have sleep apnea, will work up further in future but does not need anything done prior to surgery. Has gotten clearance from his cardiologist per chart review for knee replacement.   CKD (chronic kidney disease), unspecified stage -     BMP8+EGFR  Tobacco use disorder Not interested in quitting now, will continue to address.  Seborrhea -     ketoconazole (NIZORAL) 2 % cream; Apply 1 application topically daily.  Essential hypertension, benign Elevated today, usually has better control. Check at home, pt to call me in one week with home values.   Neuropathy (HCC) -     gabapentin (NEURONTIN) 300 MG capsule; Take 1 capsule (300 mg total) by mouth 3 (three) times daily.   Follow up plan: Return in about 3 months (around 06/15/2015).  Check PFTs, discuss  sleep apnea and possible repeat sleep study with nasal CPAP, tobacco abuse  Assunta Found, MD [facility] 03/17/2015, 3:18 PM

## 2015-03-17 NOTE — Patient Instructions (Signed)
Check BP at home. Call me with numbers you're getting in 1 week.

## 2015-03-18 LAB — BMP8+EGFR
BUN/Creatinine Ratio: 12 (ref 10–22)
BUN: 20 mg/dL (ref 8–27)
CO2: 24 mmol/L (ref 18–29)
Calcium: 9.5 mg/dL (ref 8.6–10.2)
Chloride: 102 mmol/L (ref 96–106)
Creatinine, Ser: 1.62 mg/dL — ABNORMAL HIGH (ref 0.76–1.27)
GFR calc Af Amer: 49 mL/min/{1.73_m2} — ABNORMAL LOW (ref >59)
GFR calc non Af Amer: 42 mL/min/{1.73_m2} — ABNORMAL LOW (ref >59)
Glucose: 84 mg/dL (ref 65–99)
Potassium: 5 mmol/L (ref 3.5–5.2)
Sodium: 141 mmol/L (ref 134–144)

## 2015-03-22 ENCOUNTER — Telehealth: Payer: Self-pay | Admitting: *Deleted

## 2015-03-22 NOTE — Telephone Encounter (Signed)
-----   Message from Eustaquio Maize, MD sent at 03/21/2015  8:58 PM EST ----- Regarding: blood pressure recheck BP elevated at recent visit, are BPs any better at home?

## 2015-03-22 NOTE — Telephone Encounter (Signed)
Patient called back with one BP reading. 179/97.

## 2015-03-22 NOTE — Telephone Encounter (Signed)
Patient will call back later today with updated BP

## 2015-03-25 ENCOUNTER — Telehealth: Payer: Self-pay | Admitting: Pediatrics

## 2015-03-25 MED ORDER — AMLODIPINE BESYLATE 10 MG PO TABS: 10.0000 mg | ORAL_TABLET | Freq: Every day | ORAL | Status: DC

## 2015-03-25 NOTE — Telephone Encounter (Signed)
Pt was told to take Amlodipine 10mg  yesterday BP today is 148/89 Is he supposed to continue to take whole tablet If so, he needs a refill  Please advise

## 2015-03-25 NOTE — Telephone Encounter (Signed)
Yes, should continue to take whole pill, refill sent in.

## 2015-03-31 ENCOUNTER — Ambulatory Visit: Payer: Medicare Other | Admitting: Pediatrics

## 2015-03-31 ENCOUNTER — Ambulatory Visit: Payer: Self-pay | Admitting: Orthopedic Surgery

## 2015-03-31 NOTE — Progress Notes (Signed)
Preoperative surgical orders have been place into the Epic hospital system for [name] on 03/31/2015, 3:34 PM  by Mickel Crow for surgery on 04-26-15.  Preop Uni Knee orders including Experal, IV Tylenol, and IV Decadron as long as there are no contraindications to the above medications. Arlee Muslim, PA-C

## 2015-04-02 ENCOUNTER — Ambulatory Visit (INDEPENDENT_AMBULATORY_CARE_PROVIDER_SITE_OTHER): Payer: Medicare Other | Admitting: Cardiology

## 2015-04-02 ENCOUNTER — Encounter: Payer: Self-pay | Admitting: Cardiology

## 2015-04-02 VITALS — BP 143/89 | HR 66 | Ht 68.0 in | Wt 277.2 lb

## 2015-04-02 DIAGNOSIS — Z01810 Encounter for preprocedural cardiovascular examination: Secondary | ICD-10-CM

## 2015-04-02 DIAGNOSIS — I10 Essential (primary) hypertension: Secondary | ICD-10-CM

## 2015-04-02 DIAGNOSIS — I2510 Atherosclerotic heart disease of native coronary artery without angina pectoris: Secondary | ICD-10-CM | POA: Diagnosis not present

## 2015-04-02 NOTE — Progress Notes (Signed)
Cardiology Office Note  Date: 04/02/2015   ID: SOANE WILK, DOB [birth year], MRN [id number]  PCP: Eustaquio Maize, MD  Primary Cardiologist: Rozann Lesches, MD   Chief Complaint  Patient presents with  . Preoperative evaluation    History of Present Illness: [name] is a 69 y.o. male last seen in August for preoperative cardiac evaluation prior to anticipated partial right knee replacement by Dr. Wynelle Link in January. At that time it was not felt that any further cardiac testing was required prior to surgery.  He comes in today with concerns about recent elevation in his blood pressure. He states that Cozaar has just been increased from 25 mg daily to 50 mg daily, systolic blood pressure is in the 140s today. He seems to be anxious about his upcoming surgery in talking with him. He does not report any angina symptoms, does describe some left olecranon tenderness that does not sound cardiac in origin. Follow-up ECG today shows sinus rhythm with prolonged PR interval, LVH and left anterior fascicular block. Still describes activities meeting or exceeding 4 METs without angina.  Cardiac catheterization in June 2011 demonstrated minimal, nonobstructive CAD. He also had a follow-up echocardiogram in 2014 demonstrating normal LVEF, no major clinically significant valvular abnormalities.  Past Medical History  Diagnosis Date  . Coronary atherosclerosis of native coronary artery     Mild atherosclerosis at catheterization 2011  . Gout   . Nephrolithiasis   . GERD (gastroesophageal reflux disease)   . Essential hypertension, benign   . Sleep apnea     Intolerant of CPAP  . Aortic atherosclerosis (HCC)     Penetrating aortic ulcer s/p endograft 2004 - followed by Dr. Trula Slade  . Prostate cancer [facility])   . COPD (chronic obstructive pulmonary disease) ([place])   . Tubular adenoma of colon 08/2008    Current Outpatient Prescriptions  Medication Sig Dispense Refill  .  allopurinol (ZYLOPRIM) 300 MG tablet TAKE ONE TABLET BY MOUTH ONCE DAILY 30 tablet 2  . amLODipine (NORVASC) 10 MG tablet Take 1 tablet (10 mg total) by mouth daily. 30 tablet 5  . bicalutamide (CASODEX) 50 MG tablet Take 50 mg by mouth daily.     . carvedilol (COREG) 25 MG tablet TAKE ONE TABLET BY MOUTH TWICE DAILY 180 tablet 3  . cloNIDine (CATAPRES) 0.1 MG tablet Take 2 tablets (0.2 mg total) by mouth 2 (two) times daily. 360 tablet 1  . diclofenac sodium (VOLTAREN) 1 % GEL Apply 2 g topically 4 (four) times daily. 100 g 11  . ketoconazole (NIZORAL) 2 % cream Apply 1 application topically daily. 15 g 0  . losartan (COZAAR) 50 MG tablet Take 1 tablet (50 mg total) by mouth daily. 90 tablet 0  . oxybutynin (DITROPAN) 5 MG tablet Take 5 mg by mouth daily.     . pantoprazole (PROTONIX) 40 MG tablet TAKE ONE TABLET BY MOUTH TWICE DAILY 60 tablet 2  . spironolactone (ALDACTONE) 25 MG tablet TAKE ONE-HALF TABLET BY MOUTH ONCE DAILY 15 tablet 5   No current facility-administered medications for this visit.   Allergies:  Carbidopa-levodopa; Morphine sulfate; Sulfacetamide sodium; and Sulfamethoxazole-trimethoprim   Social History: The patient  reports that he has been smoking Cigarettes.  He has a 21 pack-year smoking history. He has never used smokeless tobacco. He reports that he does not drink alcohol or use illicit drugs.   ROS:  Please see the history of present illness. Otherwise, complete review of systems is positive  for anxiety, NYHA class II dyspnea, chronic knee pain.  All other systems are reviewed and negative.   Physical Exam: VS:  BP 143/89 mmHg  Pulse 66  Ht 5\' 8"  (1.727 m)  Wt 277 lb 3.2 oz (125.737 kg)  BMI 42.16 kg/m2  SpO2 95%, BMI Body mass index is 42.16 kg/(m^2).  Wt Readings from Last 3 Encounters:  04/02/15 277 lb 3.2 oz (125.737 kg)  03/17/15 282 lb (127.914 kg)  12/30/14 278 lb 12.8 oz (126.463 kg)    Obese male, appears comfortable at rest. HEENT: Conjunctiva  and lids normal, oropharynx clear. Neck: Supple, no elevated JVP or carotid bruits, no thyromegaly. Lungs: Clear to auscultation, nonlabored breathing at rest. Cardiac: Regular rate and rhythm, no S3, 2/6 systolic murmur, no pericardial rub. Abdomen: Soft, nontender, obese, bowel sounds present, no guarding or rebound. Extremities: Trace edema, distal pulses 2+. Skin: Warm and dry. Musculoskeletal: No kyphosis. Neuropsychiatric: Alert and oriented x3, affect grossly appropriate.  ECG: ECG is ordered today.  Recent Labwork: 04/17/2014: ALT 12; AST 13 03/17/2015: BUN 20; Creatinine, Ser 1.62*; Potassium 5.0; Sodium 141     Component Value Date/Time   CHOL 225* 12/30/2014 1254   CHOL 188 10/15/2012 1320   CHOL 187 11/25/2009 0837   TRIG 165* 12/30/2014 1254   TRIG 201* 10/15/2012 1320   HDL 38* 12/30/2014 1254   HDL 36* 10/15/2012 1320   HDL 34.10* 11/25/2009 0837   CHOLHDL 5.9* 12/30/2014 1254   CHOLHDL 5 11/25/2009 0837   VLDL 40.4* 11/25/2009 0837   LDLCALC 154* 12/30/2014 1254   LDLCALC 112* 10/15/2012 1320   LDLCALC 73 06/24/2008 2014   LDLDIRECT 132.2 11/25/2009 0837    Other Studies Reviewed Today:  Echocardiogram 01/07/2013: Study Conclusions  - Study data: Technically difficult study - Left ventricle: The cavity size was normal. Wall thickness was normal. Systolic function was normal. The estimated ejection fraction was in the range of 60% to 65%. Doppler parameters are consistent with abnormal left ventricular relaxation (grade 1 diastolic dysfunction). - Aortic valve: Mild regurgitation. Valve area: 1.88cm^2(VTI). Valve area: 1.89cm^2 (Vmax). - Mitral valve: Mildly to moderately calcified annulus. Mildly thickened leaflets . - Left atrium: The atrium was mildly dilated.  Assessment and Plan:  1. Preoperative cardiac evaluation in 69 year old male with mild coronary atherosclerosis documented in 2011, no angina symptoms, mild asymptomatic aortic  regurgitation,  Essential hypertension, and hyperlipidemia. Still describes activities meeting or exceeding 4 METs without angina or unusual shortness of breath. Follow-up ECG is stable overall. Should still be able to proceed with planned surgery with Dr. Wynelle Link with no additional cardiac testing at this time. Would continue medical therapy aimed at blood pressure control  2. Essential hypertension. Current regimen includes Coreg, Norvasc, clonidine, Aldactone, and Cozaar. Agree with recent increase in Cozaar dose to 50 mg daily, this could be further up titrated if needed.  Current medicines were reviewed with the patient today.  Disposition: FU with me in 1 year.   Signed, Satira Sark, MD, [facility] 04/02/2015 1:38 PM    [facility] [address], [place], [place] [address] Phone: [phone]; Fax: [phone]

## 2015-04-02 NOTE — Patient Instructions (Signed)
Your physician wants you to follow-up in: 1 year with Dr. McDowell You will receive a reminder letter in the mail two months in advance. If you don't receive a letter, please call our office to schedule the follow-up appointment.  Your physician recommends that you continue on your current medications as directed. Please refer to the Current Medication list given to you today.  Thank you for choosing [facility]!!   

## 2015-04-16 NOTE — Patient Instructions (Addendum)
YOUR PROCEDURE IS SCHEDULED ON :  04/26/15  REPORT TO [facility] MAIN ENTRANCE FOLLOW SIGNS TO EAST ELEVATOR - GO TO 3rd FLOOR CHECK IN AT 3 EAST NURSES STATION (SHORT STAY) AT: 6:30 AM  CALL THIS NUMBER IF YOU HAVE PROBLEMS THE MORNING OF SURGERY [phone]  REMEMBER:ONLY 1 PER PERSON MAY GO TO SHORT STAY WITH YOU TO GET READY THE MORNING OF YOUR SURGERY  DO NOT EAT FOOD OR DRINK LIQUIDS AFTER MIDNIGHT  TAKE THESE MEDICINES THE MORNING OF SURGERY: NONE  YOU MAY NOT HAVE ANY METAL ON YOUR BODY INCLUDING HAIR PINS AND PIERCING'S. DO NOT WEAR JEWELRY, MAKEUP, LOTIONS, POWDERS OR PERFUMES. DO NOT WEAR NAIL POLISH. DO NOT SHAVE 48 HRS PRIOR TO SURGERY. MEN MAY SHAVE FACE AND NECK.  DO NOT Magnet. [facility] IS NOT RESPONSIBLE FOR VALUABLES.  CONTACTS, DENTURES OR PARTIALS MAY NOT BE WORN TO SURGERY. LEAVE SUITCASE IN CAR. CAN BE BROUGHT TO ROOM AFTER SURGERY.  PATIENTS DISCHARGED THE DAY OF SURGERY WILL NOT BE ALLOWED TO DRIVE HOME.  PLEASE READ OVER THE FOLLOWING INSTRUCTION SHEETS _________________________________________________________________________________                                          [facility] - PREPARING FOR SURGERY  Before surgery, you can play an important role.  Because skin is not sterile, your skin needs to be as free of germs as possible.  You can reduce the number of germs on your skin by washing with CHG (chlorahexidine gluconate) soap before surgery.  CHG is an antiseptic cleaner which kills germs and bonds with the skin to continue killing germs even after washing. Please DO NOT use if you have an allergy to CHG or antibacterial soaps.  If your skin becomes reddened/irritated stop using the CHG and inform your nurse when you arrive at Short Stay. Do not shave (including legs and underarms) for at least 48 hours prior to the first CHG shower.  You may shave your face. Please follow these instructions  carefully:   1.  Shower with CHG Soap the night before surgery and the  morning of Surgery.   2.  If you choose to wash your hair, wash your hair first as usual with your  normal  Shampoo.   3.  After you shampoo, rinse your hair and body thoroughly to remove the  shampoo.                                         4.  Use CHG as you would any other liquid soap.  You can apply chg directly  to the skin and wash . Gently wash with scrungie or clean wascloth    5.  Apply the CHG Soap to your body ONLY FROM THE NECK DOWN.   Do not use on open                           Wound or open sores. Avoid contact with eyes, ears mouth and genitals (private parts).                        Genitals (private parts) with your normal soap.  6.  Wash thoroughly, paying special attention to the area where your surgery  will be performed.   7.  Thoroughly rinse your body with warm water from the neck down.   8.  DO NOT shower/wash with your normal soap after using and rinsing off  the CHG Soap .                9.  Pat yourself dry with a clean towel.             10.  Wear clean night clothes to bed after shower             11.  Place clean sheets on your bed the night of your first shower and do not  sleep with pets.  Day of Surgery : Do not apply any lotions/deodorants the morning of surgery.  Please wear clean clothes to the hospital/surgery center.  FAILURE TO FOLLOW THESE INSTRUCTIONS MAY RESULT IN THE CANCELLATION OF YOUR SURGERY    PATIENT SIGNATURE_________________________________  ______________________________________________________________________     Adam Phenix  An incentive spirometer is a tool that can help keep your lungs clear and active. This tool measures how well you are filling your lungs with each breath. Taking long deep breaths may help reverse or decrease the chance of developing breathing (pulmonary) problems (especially infection) following:  A long  period of time when you are unable to move or be active. BEFORE THE PROCEDURE   If the spirometer includes an indicator to show your best effort, your nurse or respiratory therapist will set it to a desired goal.  If possible, sit up straight or lean slightly forward. Try not to slouch.  Hold the incentive spirometer in an upright position. INSTRUCTIONS FOR USE   Sit on the edge of your bed if possible, or sit up as far as you can in bed or on a chair.  Hold the incentive spirometer in an upright position.  Breathe out normally.  Place the mouthpiece in your mouth and seal your lips tightly around it.  Breathe in slowly and as deeply as possible, raising the piston or the ball toward the top of the column.  Hold your breath for 3-5 seconds or for as long as possible. Allow the piston or ball to fall to the bottom of the column.  Remove the mouthpiece from your mouth and breathe out normally.  Rest for a few seconds and repeat Steps 1 through 7 at least 10 times every 1-2 hours when you are awake. Take your time and take a few normal breaths between deep breaths.  The spirometer may include an indicator to show your best effort. Use the indicator as a goal to work toward during each repetition.  After each set of 10 deep breaths, practice coughing to be sure your lungs are clear. If you have an incision (the cut made at the time of surgery), support your incision when coughing by placing a pillow or rolled up towels firmly against it. Once you are able to get out of bed, walk around indoors and cough well. You may stop using the incentive spirometer when instructed by your caregiver.  RISKS AND COMPLICATIONS  Take your time so you do not get dizzy or light-headed.  If you are in pain, you may need to take or ask for pain medication before doing incentive spirometry. It is harder to take a deep breath if you are having pain. AFTER USE  Rest and breathe slowly and easily.  It can  be helpful to keep track of a log of your progress. Your caregiver can provide you with a simple table to help with this. If you are using the spirometer at home, follow these instructions: [place] IF:   You are having difficultly using the spirometer.  You have trouble using the spirometer as often as instructed.  Your pain medication is not giving enough relief while using the spirometer.  You develop fever of 100.5 F (38.1 C) or higher. SEEK IMMEDIATE MEDICAL CARE IF:   You cough up bloody sputum that had not been present before.  You develop fever of 102 F (38.9 C) or greater.  You develop worsening pain at or near the incision site. MAKE SURE YOU:   Understand these instructions.  Will watch your condition.  Will get help right away if you are not doing well or get worse. Document Released: 07/31/2006 Document Revised: 06/12/2011 Document Reviewed: 10/01/2006 Exit[employer] Patient Information 2014 [employer], [place].   ________________________________________________________________________  WHAT IS A BLOOD TRANSFUSION? Blood Transfusion Information  A transfusion is the replacement of blood or some of its parts. Blood is made up of multiple cells which provide different functions.  Red blood cells carry oxygen and are used for blood loss replacement.  White blood cells fight against infection.  Platelets control bleeding.  Plasma helps clot blood.  Other blood products are available for specialized needs, such as hemophilia or other clotting disorders. BEFORE THE TRANSFUSION  Who gives blood for transfusions?   Healthy volunteers who are fully evaluated to make sure their blood is safe. This is blood bank blood. Transfusion therapy is the safest it has ever been in the practice of medicine. Before blood is taken from a donor, a complete history is taken to make sure that person has no history of diseases nor engages in risky social behavior (examples are  intravenous drug use or sexual activity with multiple partners). The donor's travel history is screened to minimize risk of transmitting infections, such as malaria. The donated blood is tested for signs of infectious diseases, such as HIV and hepatitis. The blood is then tested to be sure it is compatible with you in order to minimize the chance of a transfusion reaction. If you or a relative donates blood, this is often done in anticipation of surgery and is not appropriate for emergency situations. It takes many days to process the donated blood. RISKS AND COMPLICATIONS Although transfusion therapy is very safe and saves many lives, the main dangers of transfusion include:   Getting an infectious disease.  Developing a transfusion reaction. This is an allergic reaction to something in the blood you were given. Every precaution is taken to prevent this. The decision to have a blood transfusion has been considered carefully by your caregiver before blood is given. Blood is not given unless the benefits outweigh the risks. AFTER THE TRANSFUSION  Right after receiving a blood transfusion, you will usually feel much better and more energetic. This is especially true if your red blood cells have gotten low (anemic). The transfusion raises the level of the red blood cells which carry oxygen, and this usually causes an energy increase.  The nurse administering the transfusion will monitor you carefully for complications. HOME CARE INSTRUCTIONS  No special instructions are needed after a transfusion. You may find your energy is better. Speak with your caregiver about any limitations on activity for underlying diseases you may have. SEEK MEDICAL CARE IF:   Your  condition is not improving after your transfusion.  You develop redness or irritation at the intravenous (IV) site. SEEK IMMEDIATE MEDICAL CARE IF:  Any of the following symptoms occur over the next 12 hours:  Shaking chills.  You have a  temperature by mouth above 102 F (38.9 C), not controlled by medicine.  Chest, back, or muscle pain.  People around you feel you are not acting correctly or are confused.  Shortness of breath or difficulty breathing.  Dizziness and fainting.  You get a rash or develop hives.  You have a decrease in urine output.  Your urine turns a dark color or changes to pink, red, or brown. Any of the following symptoms occur over the next 10 days:  You have a temperature by mouth above 102 F (38.9 C), not controlled by medicine.  Shortness of breath.  Weakness after normal activity.  The white part of the eye turns yellow (jaundice).  You have a decrease in the amount of urine or are urinating less often.  Your urine turns a dark color or changes to pink, red, or brown. Document Released: 03/17/2000 Document Revised: 06/12/2011 Document Reviewed: 11/04/2007 [facility] Patient Information 2014 [place], [place].  _______________________________________________________________________

## 2015-04-20 ENCOUNTER — Encounter (HOSPITAL_COMMUNITY)
Admission: RE | Admit: 2015-04-20 | Discharge: 2015-04-20 | Disposition: A | Discharge status: Home / Self Care | Payer: Medicare Other | Source: Ambulatory Visit | Attending: Orthopedic Surgery | Admitting: Orthopedic Surgery

## 2015-04-20 ENCOUNTER — Encounter (HOSPITAL_COMMUNITY): Payer: Self-pay

## 2015-04-20 DIAGNOSIS — Z8711 Personal history of peptic ulcer disease: Secondary | ICD-10-CM | POA: Insufficient documentation

## 2015-04-20 DIAGNOSIS — Z885 Allergy status to narcotic agent status: Secondary | ICD-10-CM | POA: Insufficient documentation

## 2015-04-20 DIAGNOSIS — F17210 Nicotine dependence, cigarettes, uncomplicated: Secondary | ICD-10-CM | POA: Insufficient documentation

## 2015-04-20 DIAGNOSIS — Z0183 Encounter for blood typing: Secondary | ICD-10-CM | POA: Insufficient documentation

## 2015-04-20 DIAGNOSIS — Z882 Allergy status to sulfonamides status: Secondary | ICD-10-CM | POA: Diagnosis not present

## 2015-04-20 DIAGNOSIS — K219 Gastro-esophageal reflux disease without esophagitis: Secondary | ICD-10-CM | POA: Diagnosis not present

## 2015-04-20 DIAGNOSIS — I2510 Atherosclerotic heart disease of native coronary artery without angina pectoris: Secondary | ICD-10-CM | POA: Diagnosis not present

## 2015-04-20 DIAGNOSIS — M1711 Unilateral primary osteoarthritis, right knee: Secondary | ICD-10-CM | POA: Insufficient documentation

## 2015-04-20 DIAGNOSIS — Z01812 Encounter for preprocedural laboratory examination: Secondary | ICD-10-CM | POA: Diagnosis not present

## 2015-04-20 DIAGNOSIS — Z85828 Personal history of other malignant neoplasm of skin: Secondary | ICD-10-CM | POA: Insufficient documentation

## 2015-04-20 DIAGNOSIS — Z8546 Personal history of malignant neoplasm of prostate: Secondary | ICD-10-CM | POA: Insufficient documentation

## 2015-04-20 DIAGNOSIS — E7800 Pure hypercholesterolemia, unspecified: Secondary | ICD-10-CM | POA: Diagnosis not present

## 2015-04-20 DIAGNOSIS — J449 Chronic obstructive pulmonary disease, unspecified: Secondary | ICD-10-CM | POA: Insufficient documentation

## 2015-04-20 DIAGNOSIS — Z79899 Other long term (current) drug therapy: Secondary | ICD-10-CM | POA: Insufficient documentation

## 2015-04-20 DIAGNOSIS — I10 Essential (primary) hypertension: Secondary | ICD-10-CM | POA: Diagnosis not present

## 2015-04-20 HISTORY — DX: Polyneuropathy, unspecified: G62.9

## 2015-04-20 HISTORY — DX: Unspecified osteoarthritis, unspecified site: M19.90

## 2015-04-20 HISTORY — DX: Personal history of urinary calculi: Z87.442

## 2015-04-20 LAB — COMPREHENSIVE METABOLIC PANEL
ALT: 16 U/L — ABNORMAL LOW (ref 17–63)
AST: 16 U/L (ref 15–41)
Albumin: 4.1 g/dL (ref 3.5–5.0)
Alkaline Phosphatase: 70 U/L (ref 38–126)
Anion gap: 8 (ref 5–15)
BUN: 22 mg/dL — ABNORMAL HIGH (ref 6–20)
CO2: 22 mmol/L (ref 22–32)
Calcium: 8.7 mg/dL — ABNORMAL LOW (ref 8.9–10.3)
Chloride: 110 mmol/L (ref 101–111)
Creatinine, Ser: 1.82 mg/dL — ABNORMAL HIGH (ref 0.61–1.24)
GFR calc Af Amer: 42 mL/min — ABNORMAL LOW (ref >60)
GFR calc non Af Amer: 36 mL/min — ABNORMAL LOW (ref >60)
Glucose, Bld: 96 mg/dL (ref 65–99)
Potassium: 4.4 mmol/L (ref 3.5–5.1)
Sodium: 140 mmol/L (ref 135–145)
Total Bilirubin: 0.6 mg/dL (ref 0.3–1.2)
Total Protein: 7.2 g/dL (ref 6.5–8.1)

## 2015-04-20 LAB — CBC
HCT: 38.6 % — ABNORMAL LOW (ref 39.0–52.0)
Hemoglobin: 12.5 g/dL — ABNORMAL LOW (ref 13.0–17.0)
MCH: 29.7 pg (ref 26.0–34.0)
MCHC: 32.4 g/dL (ref 30.0–36.0)
MCV: 91.7 fL (ref 78.0–100.0)
Platelets: 212 10*3/uL (ref 150–400)
RBC: 4.21 MIL/uL — ABNORMAL LOW (ref 4.22–5.81)
RDW: 15.5 % (ref 11.5–15.5)
WBC: 5.4 10*3/uL (ref 4.0–10.5)

## 2015-04-20 LAB — URINALYSIS, ROUTINE W REFLEX MICROSCOPIC
Bilirubin Urine: NEGATIVE
Glucose, UA: NEGATIVE mg/dL
Hgb urine dipstick: NEGATIVE
Ketones, ur: NEGATIVE mg/dL
Leukocytes, UA: NEGATIVE
Nitrite: NEGATIVE
Protein, ur: NEGATIVE mg/dL
Specific Gravity, Urine: 1.015 (ref 1.005–1.030)
pH: 5.5 (ref 5.0–8.0)

## 2015-04-20 LAB — APTT: aPTT: 42 seconds — ABNORMAL HIGH (ref 24–37)

## 2015-04-20 LAB — SURGICAL PCR SCREEN
MRSA, PCR: NEGATIVE
Staphylococcus aureus: NEGATIVE

## 2015-04-20 LAB — PROTIME-INR
INR: 1.17 (ref 0.00–1.49)
Prothrombin Time: 15 seconds (ref 11.6–15.2)

## 2015-04-20 LAB — ABO/RH: ABO/RH(D): B POS

## 2015-04-20 NOTE — Progress Notes (Signed)
Abnormal lab faxed to Dr.Aluisio

## 2015-04-25 ENCOUNTER — Ambulatory Visit: Payer: Self-pay | Admitting: Orthopedic Surgery

## 2015-04-25 MED ORDER — DEXTROSE 5 % IV SOLN
3.0000 g | INTRAVENOUS | Status: AC
  Filled 2015-04-25: qty 3000

## 2015-04-25 NOTE — H&P (Signed)
[name] DOB: Sep 13, 1945 Married / Language: English / Race: White Male Date of Admission:  04/26/2015 CC:  Right knee paon History of Present Illness The patient is a 70 year old male who comes in for a preoperative History and Physical. The patient is scheduled for a right unicompartmental knee replacement to be performed by Dr. Dione Plover. Aluisio, MD at [facility] on 04-26-2015. The patient is a 70 year old male who presented for follow up of their knee. The patient is being followed for their right knee pain and osteoarthritis. Symptoms reported include: pain, aching and pain with weightbearing. The patient feels that they are doing poorly and report their pain level to be mild. Current treatment includes: NSAIDs (Voltaren gel). Unfortunately, the injections did not provide any benefit for him. He has had cortisone and viscosupplements in the past. He continues with pinpoint medial-sided pain. He says it is getting worse and it is providing further limitations as to what he can and cannot do. He is not having any instability symptoms, just has pain which is worse with activity than it is at rest. He generally can rest well and sleep well. He would like to be more active, but the knee is preventing him from doing so. He states he has put on a lot of weight due to need for steroids, utilizing a treatment of his prostate cancer, but he cannot lose the weight because he cannot be active. He is ready to proceed with surgery. They have been treated conservatively in the past for the above stated problem and despite conservative measures, they continue to have progressive pain and severe functional limitations and dysfunction. They have failed non-operative management including home exercise, medications, and injections. It is felt that they would benefit from undergoing total joint replacement. Risks and benefits of the procedure have been discussed with the patient and they elect to proceed  with surgery. There are no active contraindications to surgery such as ongoing infection or rapidly progressive neurological disease.  Problem List/Past Medical  Chronic pain of right knee (M25.561)  Primary osteoarthritis of right knee (M17.11)  Gastroesophageal Reflux Disease  Hiatal Hernia  Gout  High blood pressure  Hypercholesterolemia  Kidney Stone  Prostate Cancer  Prostate Disease  Skin Cancer  Sleep Apnea  No CPAP Ulcer disease  Gastric Ulcer  Past History History of Gastric Bleeding Ulcer  Coronary Artery Disease/Heart Disease  Nonobstructive per Cath June 2011 Aortic Atherosclerosis  COPD  Tubular Adenoma of Colon  Allergies Sulfa Antibiotics  Unknown RXN Morphine Sulfate (Concentrate) *ANALGESICS - OPIOID*  Nausea, Vomiting.  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 AmLODIPine Besylate (10MG  Tablet, Oral) Active. Bicalutamide (50MG  Tablet, Oral) Active. Carvedilol (25MG  Tablet, Oral) Active. CloNIDine HCl (0.1MG  Tablet, Oral) Active. Losartan Potassium (50MG  Tablet, Oral) Active. Oxybutynin Chloride (5MG  Tablet, Oral) Active. Spironolactone (25MG  Tablet, Oral) Active. Gabapentin (Oral) Specific strength unknown -  Active. Voltaren (1% Gel, Transdermal) Active.  Past Surgical History Arthroscopy of Knee   right Resection of Stomach  EGD with cauterization of bleeding ulcer  Aortic Stenting  Date: 09/2007. [facility] - Aortic Stent Graft Aotic Endograft Repair  Date: 2004. Dr. [name] Umbilical Hernia Repair  Arthroscopic Knee Surgery - Left  Prostate Biopsy  Date: 09/2011. Circumcision  Date: 09/2011.   Review of Systems General Not Present- Chills, Fatigue, Fever, Memory Loss, Night Sweats, Weight Gain and Weight Loss. Skin Not Present- Eczema, Hives, Itching, Lesions and Rash. HEENT Not Present- Dentures, Double Vision, Headache, Hearing Loss, Tinnitus and Visual Loss. Respiratory Present- Shortness of breath with exertion (moderate exertion). Not Present- Allergies, Chronic Cough, Coughing up blood and Shortness of breath at rest. Cardiovascular Not Present- Chest Pain, Difficulty Breathing Lying Down, Murmur, Palpitations, Racing/skipping heartbeats and Swelling. Gastrointestinal Not Present- Abdominal Pain, Bloody Stool, Constipation, Diarrhea, Difficulty Swallowing, Heartburn, Jaundice, Loss of appetitie, Nausea and Vomiting. Male Genitourinary Not Present- Blood in Urine, Discharge, Flank Pain, Incontinence, Painful Urination, Urgency, Urinary frequency, Urinary Retention, Urinating at Night and Weak urinary stream. Musculoskeletal Present- Joint Pain (knee pain). Not Present- Back Pain, Joint Swelling, Morning Stiffness, Muscle Pain, Muscle Weakness and Spasms. Neurological Not Present- Blackout spells, Difficulty with balance, Dizziness, Paralysis, Tremor and Weakness. Psychiatric Not Present- Insomnia.  Vitals Height: 68in Height was reported by patient. BP: 152/88 (Sitting, Right Arm, Standard)   Physical Exam General Mental Status -Alert, cooperative and good historian. General Appearance-pleasant, Not in acute distress. Orientation-Oriented X3. Build & Nutrition-Well nourished and Well developed.  Head and Neck Head-normocephalic, atraumatic . Neck Global  Assessment - supple, no bruit auscultated on the right, no bruit auscultated on the left.  Eye Vision-Wears corrective lenses. Pupil - Bilateral-Regular and Round. Motion - Bilateral-EOMI.  Chest and Lung Exam Auscultation Breath sounds - clear at anterior chest wall and clear at posterior chest wall. Adventitious sounds - No Adventitious sounds.  Cardiovascular Auscultation Rhythm - Regular rate and rhythm. Heart Sounds - S1 WNL and S2 WNL. Murmurs & Other Heart Sounds: Murmur 1 - Location - Aortic Area. Timing - Early systolic. Grade - II/VI.  Abdomen Inspection Contour - Generalized moderate distention. Palpation/Percussion Tenderness - Abdomen is non-tender to palpation. Rigidity (guarding) - Abdomen is soft. Auscultation Auscultation of the abdomen reveals - Bowel sounds normal.  Male Genitourinary Note: Not done, not pertinent to present illness   Musculoskeletal Note: On exam, well-developed male, alert and oriented, in no apparent distress. Evaluation of his right knee shows no effusion. His range of motion is about 0 to 130. He is very tender on the medial joint line. There is no lateral joint line tenderness or any instability noted.  IMAGING His radiographs, AP both knees and lateral of the right shows that he has got an osteochondral defect in the medial femoral condyle. He still has excellent preservation of his lateral and patellofemoral compartments. He is not bone-on- bone medial compartment, but has large osteochondral defect.  Assessment & Plan Primary osteoarthritis of right knee (M17.11)  Note:Surgical Plans: Right Unicompartmental Knee Replacement  Disposition: Home with wife  PCP: Dr. Alain Honey- Patient has been seen preoperatively and felt to be stable for surgery. Cards: Dr. [name] - Patient has been seen preoperatively and felt to be stable for surgery.  Topical TXA - CAD, Prostate Cancer  Anesthesia Issues: None  Signed  electronically by Joelene Millin, III PA-C

## 2015-04-26 ENCOUNTER — Ambulatory Visit (HOSPITAL_COMMUNITY): Payer: Medicare Other | Admitting: Anesthesiology

## 2015-04-26 ENCOUNTER — Observation Stay (HOSPITAL_COMMUNITY)
Admission: RE | Admit: 2015-04-26 | Discharge: 2015-04-27 | Disposition: A | Discharge status: Home / Self Care | Payer: Medicare Other | Source: Ambulatory Visit | Attending: Orthopedic Surgery | Admitting: Orthopedic Surgery

## 2015-04-26 ENCOUNTER — Encounter (HOSPITAL_COMMUNITY): Payer: Self-pay | Admitting: *Deleted

## 2015-04-26 ENCOUNTER — Encounter (HOSPITAL_COMMUNITY)
Admission: RE | Disposition: A | Discharge status: Home / Self Care | Payer: Self-pay | Source: Ambulatory Visit | Attending: Orthopedic Surgery

## 2015-04-26 DIAGNOSIS — G4730 Sleep apnea, unspecified: Secondary | ICD-10-CM | POA: Diagnosis not present

## 2015-04-26 DIAGNOSIS — N189 Chronic kidney disease, unspecified: Secondary | ICD-10-CM | POA: Diagnosis not present

## 2015-04-26 DIAGNOSIS — K219 Gastro-esophageal reflux disease without esophagitis: Secondary | ICD-10-CM | POA: Diagnosis not present

## 2015-04-26 DIAGNOSIS — M109 Gout, unspecified: Secondary | ICD-10-CM | POA: Diagnosis not present

## 2015-04-26 DIAGNOSIS — Z85828 Personal history of other malignant neoplasm of skin: Secondary | ICD-10-CM | POA: Diagnosis not present

## 2015-04-26 DIAGNOSIS — M1990 Unspecified osteoarthritis, unspecified site: Secondary | ICD-10-CM | POA: Insufficient documentation

## 2015-04-26 DIAGNOSIS — E7800 Pure hypercholesterolemia, unspecified: Secondary | ICD-10-CM | POA: Insufficient documentation

## 2015-04-26 DIAGNOSIS — M1710 Unilateral primary osteoarthritis, unspecified knee: Secondary | ICD-10-CM | POA: Diagnosis present

## 2015-04-26 DIAGNOSIS — I2510 Atherosclerotic heart disease of native coronary artery without angina pectoris: Secondary | ICD-10-CM | POA: Diagnosis not present

## 2015-04-26 DIAGNOSIS — Z8546 Personal history of malignant neoplasm of prostate: Secondary | ICD-10-CM | POA: Diagnosis not present

## 2015-04-26 DIAGNOSIS — J449 Chronic obstructive pulmonary disease, unspecified: Secondary | ICD-10-CM | POA: Insufficient documentation

## 2015-04-26 DIAGNOSIS — I739 Peripheral vascular disease, unspecified: Secondary | ICD-10-CM | POA: Diagnosis not present

## 2015-04-26 DIAGNOSIS — M179 Osteoarthritis of knee, unspecified: Secondary | ICD-10-CM | POA: Diagnosis present

## 2015-04-26 DIAGNOSIS — G629 Polyneuropathy, unspecified: Secondary | ICD-10-CM | POA: Insufficient documentation

## 2015-04-26 DIAGNOSIS — Z79899 Other long term (current) drug therapy: Secondary | ICD-10-CM | POA: Insufficient documentation

## 2015-04-26 DIAGNOSIS — I10 Essential (primary) hypertension: Secondary | ICD-10-CM | POA: Insufficient documentation

## 2015-04-26 DIAGNOSIS — M1711 Unilateral primary osteoarthritis, right knee: Secondary | ICD-10-CM | POA: Diagnosis not present

## 2015-04-26 DIAGNOSIS — F17210 Nicotine dependence, cigarettes, uncomplicated: Secondary | ICD-10-CM | POA: Insufficient documentation

## 2015-04-26 DIAGNOSIS — I129 Hypertensive chronic kidney disease with stage 1 through stage 4 chronic kidney disease, or unspecified chronic kidney disease: Secondary | ICD-10-CM | POA: Diagnosis not present

## 2015-04-26 DIAGNOSIS — I700 Atherosclerosis of aorta: Secondary | ICD-10-CM | POA: Insufficient documentation

## 2015-04-26 HISTORY — PX: PARTIAL KNEE ARTHROPLASTY: SHX2174

## 2015-04-26 LAB — TYPE AND SCREEN
ABO/RH(D): B POS
Antibody Screen: NEGATIVE

## 2015-04-26 SURGERY — ARTHROPLASTY, KNEE, UNICOMPARTMENTAL
Anesthesia: Spinal | Site: Knee | Laterality: Right

## 2015-04-26 MED ORDER — PROPOFOL 10 MG/ML IV BOLUS
INTRAVENOUS | Status: AC
  Filled 2015-04-26: qty 60

## 2015-04-26 MED ORDER — OXYCODONE HCL 5 MG PO TABS
5.0000 mg | ORAL_TABLET | ORAL | Status: DC | PRN
  Administered 2015-04-26: 10 mg via ORAL
  Administered 2015-04-26: 5 mg via ORAL
  Administered 2015-04-26 – 2015-04-27 (×4): 10 mg via ORAL
  Filled 2015-04-26 (×5): qty 2
  Filled 2015-04-26: qty 1

## 2015-04-26 MED ORDER — SPIRONOLACTONE 12.5 MG HALF TABLET
12.5000 mg | ORAL_TABLET | Freq: Every day | ORAL | Status: DC
  Filled 2015-04-26: qty 1

## 2015-04-26 MED ORDER — TRANEXAMIC ACID 1000 MG/10ML IV SOLN
2000.0000 mg | Freq: Once | INTRAVENOUS | Status: DC
  Filled 2015-04-26: qty 20

## 2015-04-26 MED ORDER — DIPHENHYDRAMINE HCL 12.5 MG/5ML PO ELIX: 12.5000 mg | ORAL_SOLUTION | ORAL | Status: DC | PRN

## 2015-04-26 MED ORDER — ACETAMINOPHEN 10 MG/ML IV SOLN
1000.0000 mg | Freq: Once | INTRAVENOUS | Status: AC
  Administered 2015-04-26: 1000 mg via INTRAVENOUS

## 2015-04-26 MED ORDER — METOCLOPRAMIDE HCL 10 MG PO TABS: 5.0000 mg | ORAL_TABLET | Freq: Three times a day (TID) | ORAL | Status: DC | PRN

## 2015-04-26 MED ORDER — LACTATED RINGERS IV SOLN
INTRAVENOUS | Status: DC
Start: 2015-04-26 — End: 2015-04-26
  Administered 2015-04-26: 1000 mL via INTRAVENOUS

## 2015-04-26 MED ORDER — OXYBUTYNIN CHLORIDE 5 MG PO TABS
5.0000 mg | ORAL_TABLET | Freq: Every day | ORAL | Status: DC
  Administered 2015-04-27: 5 mg via ORAL
  Filled 2015-04-26: qty 1

## 2015-04-26 MED ORDER — GABAPENTIN 300 MG PO CAPS
300.0000 mg | ORAL_CAPSULE | Freq: Three times a day (TID) | ORAL | Status: DC
  Administered 2015-04-26 – 2015-04-27 (×2): 300 mg via ORAL
  Filled 2015-04-26 (×4): qty 1

## 2015-04-26 MED ORDER — MENTHOL 3 MG MT LOZG: 1.0000 | LOZENGE | OROMUCOSAL | Status: DC | PRN

## 2015-04-26 MED ORDER — PANTOPRAZOLE SODIUM 40 MG PO TBEC
40.0000 mg | DELAYED_RELEASE_TABLET | Freq: Two times a day (BID) | ORAL | Status: DC
  Administered 2015-04-26 – 2015-04-27 (×2): 40 mg via ORAL
  Filled 2015-04-26 (×3): qty 1

## 2015-04-26 MED ORDER — ACETAMINOPHEN 500 MG PO TABS
1000.0000 mg | ORAL_TABLET | Freq: Four times a day (QID) | ORAL | Status: AC
  Administered 2015-04-26 – 2015-04-27 (×4): 1000 mg via ORAL
  Filled 2015-04-26 (×4): qty 2

## 2015-04-26 MED ORDER — DOCUSATE SODIUM 100 MG PO CAPS
100.0000 mg | ORAL_CAPSULE | Freq: Two times a day (BID) | ORAL | Status: DC
  Administered 2015-04-26 – 2015-04-27 (×2): 100 mg via ORAL

## 2015-04-26 MED ORDER — BISACODYL 10 MG RE SUPP
10.0000 mg | Freq: Every day | RECTAL | Status: DC | PRN
Start: 2015-04-26 — End: 2015-04-27

## 2015-04-26 MED ORDER — FLEET ENEMA 7-19 GM/118ML RE ENEM: 1.0000 | ENEMA | Freq: Once | RECTAL | Status: DC | PRN

## 2015-04-26 MED ORDER — DEXAMETHASONE SODIUM PHOSPHATE 10 MG/ML IJ SOLN
10.0000 mg | Freq: Once | INTRAMUSCULAR | Status: DC
  Filled 2015-04-26: qty 1

## 2015-04-26 MED ORDER — BUPIVACAINE LIPOSOME 1.3 % IJ SUSP
INTRAMUSCULAR | Status: DC | PRN
  Administered 2015-04-26: 20 mL

## 2015-04-26 MED ORDER — CARVEDILOL 25 MG PO TABS
25.0000 mg | ORAL_TABLET | Freq: Two times a day (BID) | ORAL | Status: DC
  Filled 2015-04-26 (×2): qty 1

## 2015-04-26 MED ORDER — PROPOFOL 500 MG/50ML IV EMUL: INTRAVENOUS | Status: DC | PRN

## 2015-04-26 MED ORDER — DEXAMETHASONE SODIUM PHOSPHATE 10 MG/ML IJ SOLN
INTRAMUSCULAR | Status: AC
  Filled 2015-04-26: qty 1

## 2015-04-26 MED ORDER — BICALUTAMIDE 50 MG PO TABS
50.0000 mg | ORAL_TABLET | Freq: Every day | ORAL | Status: DC
  Administered 2015-04-27: 50 mg via ORAL
  Filled 2015-04-26: qty 1

## 2015-04-26 MED ORDER — METOCLOPRAMIDE HCL 5 MG/ML IJ SOLN: 5.0000 mg | Freq: Three times a day (TID) | INTRAMUSCULAR | Status: DC | PRN

## 2015-04-26 MED ORDER — PHENOL 1.4 % MT LIQD
1.0000 | OROMUCOSAL | Status: DC | PRN
Start: 2015-04-26 — End: 2015-04-27

## 2015-04-26 MED ORDER — SODIUM CHLORIDE 0.9 % IV SOLN
INTRAVENOUS | Status: DC | PRN
  Administered 2015-04-26: 1000 mL

## 2015-04-26 MED ORDER — ACETAMINOPHEN 650 MG RE SUPP: 650.0000 mg | Freq: Four times a day (QID) | RECTAL | Status: DC | PRN

## 2015-04-26 MED ORDER — MIDAZOLAM HCL 2 MG/2ML IJ SOLN
INTRAMUSCULAR | Status: AC
  Filled 2015-04-26: qty 2

## 2015-04-26 MED ORDER — METHOCARBAMOL 500 MG PO TABS
500.0000 mg | ORAL_TABLET | Freq: Four times a day (QID) | ORAL | Status: DC | PRN
  Administered 2015-04-26 – 2015-04-27 (×2): 500 mg via ORAL
  Filled 2015-04-26 (×2): qty 1

## 2015-04-26 MED ORDER — DEXTROSE-NACL 5-0.9 % IV SOLN
INTRAVENOUS | Status: DC
  Administered 2015-04-26 – 2015-04-27 (×2): via INTRAVENOUS

## 2015-04-26 MED ORDER — AMLODIPINE BESYLATE 10 MG PO TABS
10.0000 mg | ORAL_TABLET | Freq: Every day | ORAL | Status: DC
  Filled 2015-04-26: qty 1

## 2015-04-26 MED ORDER — ACETAMINOPHEN 325 MG PO TABS: 650.0000 mg | ORAL_TABLET | Freq: Four times a day (QID) | ORAL | Status: DC | PRN

## 2015-04-26 MED ORDER — SODIUM CHLORIDE 0.9 % IV SOLN: INTRAVENOUS | Status: DC

## 2015-04-26 MED ORDER — DEXAMETHASONE SODIUM PHOSPHATE 10 MG/ML IJ SOLN
10.0000 mg | Freq: Once | INTRAMUSCULAR | Status: AC
  Administered 2015-04-26: 10 mg via INTRAVENOUS

## 2015-04-26 MED ORDER — BUPIVACAINE LIPOSOME 1.3 % IJ SUSP
20.0000 mL | Freq: Once | INTRAMUSCULAR | Status: DC
  Filled 2015-04-26: qty 20

## 2015-04-26 MED ORDER — TRAMADOL HCL 50 MG PO TABS: 50.0000 mg | ORAL_TABLET | Freq: Four times a day (QID) | ORAL | Status: DC | PRN

## 2015-04-26 MED ORDER — LOSARTAN POTASSIUM 50 MG PO TABS
50.0000 mg | ORAL_TABLET | Freq: Every day | ORAL | Status: DC
  Filled 2015-04-26: qty 1

## 2015-04-26 MED ORDER — HYDROMORPHONE HCL 1 MG/ML IJ SOLN
0.5000 mg | INTRAMUSCULAR | Status: DC | PRN
  Administered 2015-04-26 – 2015-04-27 (×3): 0.5 mg via INTRAVENOUS
  Filled 2015-04-26 (×3): qty 1

## 2015-04-26 MED ORDER — ACETAMINOPHEN 10 MG/ML IV SOLN
INTRAVENOUS | Status: AC
  Filled 2015-04-26: qty 100

## 2015-04-26 MED ORDER — RIVAROXABAN 10 MG PO TABS
10.0000 mg | ORAL_TABLET | Freq: Every day | ORAL | Status: DC
  Administered 2015-04-27: 10 mg via ORAL
  Filled 2015-04-26 (×2): qty 1

## 2015-04-26 MED ORDER — ALLOPURINOL 300 MG PO TABS
300.0000 mg | ORAL_TABLET | Freq: Every day | ORAL | Status: DC
  Administered 2015-04-27: 300 mg via ORAL
  Filled 2015-04-26: qty 1

## 2015-04-26 MED ORDER — CEFAZOLIN SODIUM-DEXTROSE 2-3 GM-% IV SOLR
2.0000 g | Freq: Four times a day (QID) | INTRAVENOUS | Status: AC
  Administered 2015-04-26 (×2): 2 g via INTRAVENOUS
  Filled 2015-04-26 (×2): qty 50

## 2015-04-26 MED ORDER — ONDANSETRON HCL 4 MG PO TABS: 4.0000 mg | ORAL_TABLET | Freq: Four times a day (QID) | ORAL | Status: DC | PRN

## 2015-04-26 MED ORDER — 0.9 % SODIUM CHLORIDE (POUR BTL) OPTIME
TOPICAL | Status: DC | PRN
  Administered 2015-04-26: 1000 mL

## 2015-04-26 MED ORDER — PROPOFOL 500 MG/50ML IV EMUL
INTRAVENOUS | Status: DC | PRN
  Administered 2015-04-26: 75 ug/kg/min via INTRAVENOUS

## 2015-04-26 MED ORDER — BUPIVACAINE HCL (PF) 0.25 % IJ SOLN
INTRAMUSCULAR | Status: AC
  Filled 2015-04-26: qty 30

## 2015-04-26 MED ORDER — MIDAZOLAM HCL 5 MG/5ML IJ SOLN
INTRAMUSCULAR | Status: DC | PRN
  Administered 2015-04-26: 2 mg via INTRAVENOUS

## 2015-04-26 MED ORDER — POLYETHYLENE GLYCOL 3350 17 G PO PACK: 17.0000 g | PACK | Freq: Every day | ORAL | Status: DC | PRN

## 2015-04-26 MED ORDER — ONDANSETRON HCL 4 MG/2ML IJ SOLN
4.0000 mg | Freq: Four times a day (QID) | INTRAMUSCULAR | Status: DC | PRN
  Administered 2015-04-26: 4 mg via INTRAVENOUS
  Filled 2015-04-26: qty 2

## 2015-04-26 MED ORDER — BUPIVACAINE HCL (PF) 0.25 % IJ SOLN
INTRAMUSCULAR | Status: DC | PRN
  Administered 2015-04-26: 30 mL

## 2015-04-26 MED ORDER — CARVEDILOL 25 MG PO TABS
25.0000 mg | ORAL_TABLET | Freq: Two times a day (BID) | ORAL | Status: DC
  Administered 2015-04-26: 25 mg via ORAL
  Filled 2015-04-26 (×6): qty 1

## 2015-04-26 MED ORDER — CLONIDINE HCL 0.2 MG PO TABS
0.2000 mg | ORAL_TABLET | Freq: Two times a day (BID) | ORAL | Status: DC
Start: 2015-04-26 — End: 2015-04-27
  Administered 2015-04-26: 0.2 mg via ORAL
  Filled 2015-04-26 (×3): qty 1

## 2015-04-26 MED ORDER — CHLORHEXIDINE GLUCONATE 4 % EX LIQD: 60.0000 mL | Freq: Once | CUTANEOUS | Status: DC

## 2015-04-26 MED ORDER — SODIUM CHLORIDE 0.9 % IJ SOLN
INTRAMUSCULAR | Status: AC
  Filled 2015-04-26: qty 50

## 2015-04-26 MED ORDER — METHOCARBAMOL 1000 MG/10ML IJ SOLN
500.0000 mg | Freq: Four times a day (QID) | INTRAVENOUS | Status: DC | PRN
  Administered 2015-04-26: 500 mg via INTRAVENOUS
  Filled 2015-04-26 (×2): qty 5

## 2015-04-26 MED ORDER — LACTATED RINGERS IV SOLN
INTRAVENOUS | Status: DC | PRN
  Administered 2015-04-26: 09:00:00 via INTRAVENOUS

## 2015-04-26 MED ORDER — FENTANYL CITRATE (PF) 100 MCG/2ML IJ SOLN: 25.0000 ug | INTRAMUSCULAR | Status: DC | PRN

## 2015-04-26 MED ORDER — CEFAZOLIN SODIUM 10 G IJ SOLR
3.0000 g | INTRAMUSCULAR | Status: AC
  Administered 2015-04-26: 3 g via INTRAVENOUS
  Filled 2015-04-26: qty 3000

## 2015-04-26 MED ORDER — LIDOCAINE HCL (CARDIAC) 20 MG/ML IV SOLN
INTRAVENOUS | Status: AC
  Filled 2015-04-26: qty 5

## 2015-04-26 SURGICAL SUPPLY — 49 items
BAG DECANTER FOR FLEXI CONT (MISCELLANEOUS) ×2 IMPLANT
BAG SPEC THK2 15X12 ZIP CLS (MISCELLANEOUS)
BAG ZIPLOCK 12X15 (MISCELLANEOUS) IMPLANT
BANDAGE ACE 6X5 VEL STRL LF (GAUZE/BANDAGES/DRESSINGS) ×2 IMPLANT
BANDAGE ELASTIC 6 VELCRO ST LF (GAUZE/BANDAGES/DRESSINGS) ×1 IMPLANT
BLADE SAW RECIPROCATING 77.5 (BLADE) ×3 IMPLANT
BLADE SAW SGTL 13.0X1.19X90.0M (BLADE) ×2 IMPLANT
BNDG CMPR 82X61 PLY HI ABS (GAUZE/BANDAGES/DRESSINGS) ×1
BNDG CONFORM 6X.82 1P STRL (GAUZE/BANDAGES/DRESSINGS) ×1 IMPLANT
BOWL SMART MIX CTS (DISPOSABLE) ×2 IMPLANT
BUR OVAL CARBIDE 4.0 (BURR) ×3 IMPLANT
CAPT KNEE PARTIAL 2 ×2 IMPLANT
CEMENT HV SMART SET (Cement) ×2 IMPLANT
CLOTH BEACON ORANGE TIMEOUT ST (SAFETY) ×2 IMPLANT
CUFF TOURN SGL QUICK 34 (TOURNIQUET CUFF) ×2
CUFF TRNQT CYL 34X4X40X1 (TOURNIQUET CUFF) ×1 IMPLANT
DRSG ADAPTIC 3X8 NADH LF (GAUZE/BANDAGES/DRESSINGS) ×2 IMPLANT
DRSG PAD ABDOMINAL 8X10 ST (GAUZE/BANDAGES/DRESSINGS) ×2 IMPLANT
DURAPREP 26ML APPLICATOR (WOUND CARE) ×2 IMPLANT
ELECT REM PT RETURN 9FT ADLT (ELECTROSURGICAL) ×2
ELECTRODE REM PT RTRN 9FT ADLT (ELECTROSURGICAL) ×1 IMPLANT
EVACUATOR 1/8 PVC DRAIN (DRAIN) ×2 IMPLANT
GAUZE SPONGE 4X4 12PLY STRL (GAUZE/BANDAGES/DRESSINGS) ×2 IMPLANT
GLOVE BIO SURGEON STRL SZ7.5 (GLOVE) ×2 IMPLANT
GLOVE BIO SURGEON STRL SZ8 (GLOVE) ×2 IMPLANT
GLOVE BIOGEL PI IND STRL 8 (GLOVE) ×2 IMPLANT
GLOVE BIOGEL PI INDICATOR 8 (GLOVE) ×2
GOWN STRL REUS W/TWL LRG LVL3 (GOWN DISPOSABLE) ×2 IMPLANT
GOWN STRL REUS W/TWL XL LVL3 (GOWN DISPOSABLE) ×2 IMPLANT
HANDPIECE INTERPULSE COAX TIP (DISPOSABLE) ×2
IMMOBILIZER KNEE 20 (SOFTGOODS) ×2
IMMOBILIZER KNEE 20 THIGH 36 (SOFTGOODS) ×1 IMPLANT
KIT IMPL STRL TIB IPOLY IUNI IMPLANT
MANIFOLD NEPTUNE II (INSTRUMENTS) ×2 IMPLANT
PACK TOTAL KNEE CUSTOM (KITS) ×2 IMPLANT
PAD ABD 8X10 STRL (GAUZE/BANDAGES/DRESSINGS) ×1 IMPLANT
PADDING CAST COTTON 6X4 STRL (CAST SUPPLIES) ×4 IMPLANT
POSITIONER SURGICAL ARM (MISCELLANEOUS) ×2 IMPLANT
SET HNDPC FAN SPRY TIP SCT (DISPOSABLE) ×1 IMPLANT
STRIP CLOSURE SKIN 1/2X4 (GAUZE/BANDAGES/DRESSINGS) ×4 IMPLANT
SUT MNCRL AB 4-0 PS2 18 (SUTURE) ×2 IMPLANT
SUT VIC AB 2-0 CT1 27 (SUTURE) ×6
SUT VIC AB 2-0 CT1 TAPERPNT 27 (SUTURE) ×2 IMPLANT
SUT VLOC 180 0 24IN GS25 (SUTURE) ×2 IMPLANT
SYR 50ML LL SCALE MARK (SYRINGE) ×2 IMPLANT
TAPE STRIPS DRAPE STRL (GAUZE/BANDAGES/DRESSINGS) ×1 IMPLANT
TRAY FOLEY W/METER SILVER 14FR (SET/KITS/TRAYS/PACK) ×2 IMPLANT
TRAY FOLEY W/METER SILVER 16FR (SET/KITS/TRAYS/PACK) ×2 IMPLANT
WRAP KNEE MAXI GEL POST OP (GAUZE/BANDAGES/DRESSINGS) ×1 IMPLANT

## 2015-04-26 NOTE — Anesthesia Procedure Notes (Signed)
Spinal  Start time: 04/26/2015 9:18 AM End time: 04/26/2015 9:25 AM Staffing Anesthesiologist: Finis Bud Performed by: anesthesiologist  Preanesthetic Checklist Completed: patient identified, site marked, surgical consent, pre-op evaluation, timeout performed, IV checked, risks and benefits discussed and monitors and equipment checked Spinal Block Patient position: sitting Prep: Betadine Patient monitoring: heart rate, cardiac monitor, continuous pulse ox and blood pressure Approach: midline Location: L3-4 Injection technique: single-shot Needle Needle type: Quincke  Needle gauge: 22 G Assessment Sensory level: T12 Additional Notes Spinal marcaine 0.75% 13.5mg  given. Clear CSF. Pt tolerated procedure well. CE

## 2015-04-26 NOTE — Anesthesia Preprocedure Evaluation (Addendum)
Anesthesia Evaluation  Patient identified by MRN, date of birth, ID band Patient awake    Reviewed: Allergy & Precautions  Airway Mallampati: II  TM Distance: >3 FB Neck ROM: Full    Dental   Pulmonary shortness of breath, sleep apnea , COPD, Current Smoker,    breath sounds clear to auscultation       Cardiovascular hypertension, + CAD and + Peripheral Vascular Disease   Rhythm:Regular Rate:Normal     Neuro/Psych    GI/Hepatic Neg liver ROS, GERD  ,  Endo/Other    Renal/GU Renal disease     Musculoskeletal   Abdominal   Peds  Hematology   Anesthesia Other Findings   Reproductive/Obstetrics                            Anesthesia Physical Anesthesia Plan  ASA: III  Anesthesia Plan: Spinal   Post-op Pain Management:    Induction: Intravenous  Airway Management Planned: Simple Face Mask  Additional Equipment:   Intra-op Plan:   Post-operative Plan:   Informed Consent: I have reviewed the patients History and Physical, chart, labs and discussed the procedure including the risks, benefits and alternatives for the proposed anesthesia with the patient or authorized representative who has indicated his/her understanding and acceptance.   Dental advisory given  Plan Discussed with: CRNA  Anesthesia Plan Comments:         Anesthesia Quick Evaluation

## 2015-04-26 NOTE — Transfer of Care (Signed)
Immediate Anesthesia Transfer of Care Note  Patient: [name]  Procedure(s) Performed: Procedure(s): RIGHT KNEE MEDIAL UNICOMPARTMENTAL ARTHROPLASTY (Right)  Patient Location: PACU  Anesthesia Type:Spinal  Level of Consciousness: awake, alert  and oriented  Airway & Oxygen Therapy: Patient Spontanous Breathing and Patient connected to face mask oxygen  Post-op Assessment: Report given to RN and Post -op Vital signs reviewed and stable  Post vital signs: Reviewed and stable  Last Vitals:  Filed Vitals:   04/26/15 0626  BP: 149/92  Pulse: 69  Temp: 36.4 C  Resp: 20    Complications: No apparent anesthesia complications

## 2015-04-26 NOTE — Anesthesia Postprocedure Evaluation (Signed)
Anesthesia Post Note  Patient: [name]  Procedure(s) Performed: Procedure(s) (LRB): RIGHT KNEE MEDIAL UNICOMPARTMENTAL ARTHROPLASTY (Right)  Patient location during evaluation: PACU Anesthesia Type: Spinal Level of consciousness: awake Pain management: pain level controlled Respiratory status: spontaneous breathing Cardiovascular status: stable Postop Assessment: spinal receding Anesthetic complications: no    Last Vitals:  Filed Vitals:   04/26/15 1440 04/26/15 1821  BP: 119/71 131/81  Pulse: 63 65  Temp: 36.4 C 36.4 C  Resp: 16 16    Last Pain:  Filed Vitals:   04/26/15 1821  PainSc: 4                  EDWARDS,Tyrika Newman

## 2015-04-26 NOTE — Interval H&P Note (Signed)
History and Physical Interval Note:  04/26/2015 7:02 AM  [name]  has presented today for surgery, with the diagnosis of OA OF RIGHT KNEE MEDIAL COMPARTMENT  The various methods of treatment have been discussed with the patient and family. After consideration of risks, benefits and other options for treatment, the patient has consented to  Procedure(s): RIGHT KNEE MEDIAL UNICOMPARTMENTAL ARTHROPLASTY (Right) as a surgical intervention .  The patient's history has been reviewed, patient examined, no change in status, stable for surgery.  I have reviewed the patient's chart and labs.  Questions were answered to the patient's satisfaction.     [name]

## 2015-04-26 NOTE — Evaluation (Signed)
Physical Therapy Evaluation Patient Details Name: [name] MRN: [id number] DOB: Nov 22, 1945 Today's Date: 04/26/2015   History of Present Illness  R medial knee replacement  Clinical Impression  Patient only ambulated x 5', became nauseated. Assisted to recliner. Patient will benefit from PT to address problems listed in the note below.    Follow Up Recommendations Home health PT;Supervision/Assistance - 24 hour    Equipment Recommendations  Rolling walker with 5" wheels    Recommendations for Other Services       Precautions / Restrictions Precautions Precautions: Knee Required Braces or Orthoses: Knee Immobilizer - Right Knee Immobilizer - Right: Discontinue once straight leg raise with < 10 degree lag      Mobility  Bed Mobility Overal bed mobility: Needs Assistance Bed Mobility: Supine to Sit     Supine to sit: HOB elevated;Mod assist     General bed mobility comments: extrra time, support the R leg  Transfers Overall transfer level: Needs assistance Equipment used: Rolling walker (2 wheeled) Transfers: Sit to/from Stand Sit to Stand: From elevated surface;+2 safety/equipment;Min assist         General transfer comment: cues for hand placement, R leg position  Ambulation/Gait Ambulation/Gait assistance: Min assist;+2 physical assistance;+2 safety/equipment Ambulation Distance (Feet): 5 Feet Assistive device: Rolling walker (2 wheeled) Gait Pattern/deviations: Step-to pattern     General Gait Details: cues for sdequence, c/o nausea. assisted to recliner, + emesis. RN notified. BP 105/80, sats 92 % RA, HT 67  Stairs            Wheelchair Mobility    Modified Rankin (Stroke Patients Only)       Balance                                             Pertinent Vitals/Pain Pain Assessment: 0-10 Pain Score: 4  Pain Location: R knee to 7 with WB Pain Descriptors / Indicators: Aching;Tightness;Discomfort Pain  Intervention(s): Limited activity within patient's tolerance;Monitored during session;Premedicated before session;Ice applied    Home Living Family/patient expects to be discharged to:: Private residence   Available Help at Discharge: Family Type of Home: House Home Access: Stairs to enter Entrance Stairs-Rails: Psychiatric nurse of Steps: 6 Home Layout: One level Home Equipment: None      Prior Function Level of Independence: Independent               Hand Dominance        Extremity/Trunk Assessment               Lower Extremity Assessment: RLE deficits/detail RLE Deficits / Details: able to lift from bed    Cervical / Trunk Assessment: Normal  Communication   Communication: No difficulties  Cognition Arousal/Alertness: Awake/alert Behavior During Therapy: WFL for tasks assessed/performed Overall Cognitive Status: Within Functional Limits for tasks assessed                      General Comments      Exercises        Assessment/Plan    PT Assessment Patient needs continued PT services  PT Diagnosis Difficulty walking;Acute pain   PT Problem List Decreased strength;Decreased range of motion;Decreased activity tolerance;Decreased mobility;Decreased knowledge of use of DME;Decreased safety awareness;Decreased knowledge of precautions;Pain  PT Treatment Interventions DME instruction;Gait training;Stair training;Functional mobility training;Therapeutic activities;Therapeutic exercise;Patient/family education   PT Goals (  Current goals can be found in the Care Plan section) Acute Rehab PT Goals Patient Stated Goal: to walk, get up PT Goal Formulation: With patient Time For Goal Achievement: 05/03/15 Potential to Achieve Goals: Good    Frequency 7X/week   Barriers to discharge        Co-evaluation               End of Session Equipment Utilized During Treatment: Right knee immobilizer Activity Tolerance: Treatment  limited secondary to medical complications (Comment);Patient limited by pain (N/V pain) Patient left: in chair;with call bell/phone within reach Nurse Communication: Mobility status    Functional Assessment Tool Used: clinical judgement Functional Limitation: Mobility: Walking and moving around Mobility: Walking and Moving Around Current Status [id number]): At least 40 percent but less than 60 percent impaired, limited or restricted Mobility: Walking and Moving Around Goal Status [phone]): At least 1 percent but less than 20 percent impaired, limited or restricted    Time: [id number] PT Time Calculation (min) (ACUTE ONLY): 23 min   Charges:   PT Evaluation $PT Eval Low Complexity: 1 Procedure PT Treatments $Gait Training: 8-22 mins   PT G Codes:   PT G-Codes **NOT FOR INPATIENT CLASS** Functional Assessment Tool Used: clinical judgement Functional Limitation: Mobility: Walking and moving around Mobility: Walking and Moving Around Current Status [id number]): At least 40 percent but less than 60 percent impaired, limited or restricted Mobility: Walking and Moving Around Goal Status [phone]): At least 1 percent but less than 20 percent impaired, limited or restricted    Claretha Cooper 04/26/2015, 6:22 PM Tresa Endo PT [phone]

## 2015-04-26 NOTE — Op Note (Signed)
OPERATIVE REPORT  PREOPERATIVE DIAGNOSIS: Medial compartment osteoarthritis, Right knee  POSTOPERATIVE DIAGNOSIS: Medial compartment osteoarthritis, Right knee  PROCEDURE:Right knee medial unicompartmental arthroplasty.   SURGEON: Gaynelle Arabian, MD   ASSISTANT: Ardeen Jourdain, PA-C  ANESTHESIA:  Spinal.   ESTIMATED BLOOD LOSS: Minimal.   DRAINS: Hemovac x1.   TOURNIQUET TIME: 29 minutes at 300 mmHg.   COMPLICATIONS: None.   CONDITION: Stable to recovery.   BRIEF CLINICAL NOTE:[name] is a 70 y.o. male, who has  significant isolated medial compartment arthritis of the Right knee. He has had nonoperative management including injections.Unfortunately, the pain persists.  Radiograph showed isolated medial compartment  arthritis with a large osteochondral defect with normal-appearing patellofemoral and lateral compartments. He  presents now for left knee unicompartmental arthroplasty.   PROCEDURE IN DETAIL: After successful administration of  Spinal anesthetic, a tourniquet was placed high on the  Right thigh and left lower extremity prepped and draped in usual sterile fashion. Extremity was wrapped in an Esmarch, knee flexed, and tourniquet inflated to 300 mmHg. A midline incision was made with a 10 blade through subcutaneous  tissue to the extensor mechanism. A fresh blade was used to make a  medial parapatellar arthrotomy. Soft tissue on the proximal medial  tibia subperiosteally elevated to the joint line with a knife and into  the semimembranosus bursa with a Cobb elevator. The patella was  subluxed laterally, and the knee flexed 90 degrees. The ACL was intact.  The marginal osteophytes on the medial femur and tibia were removed with  a rongeur. The medial meniscus was also removed. The femoral cutting  block where the conformis unicompartmental knee system was placed along  the femur. There was excellent fit. I traced the outline. We then   removed any remaining cartilage within this outline. We then placed the  cutting block again and pinned in position. The posterior femoral cut  was made, it was approximately 5 mm. The lug holes for the femoral  component were then drilled through the cutting block. The cutting  block was subsequently removed. We then utilized the high speed burr to  create a small trough at the superior aspect of the components that of  the inset and would not overhang the cartilage. The trial was placed,  it had excellent fit. The trial was subsequently removed.       The trial was placed again and the B chip was placed. There was  excellent balance throughout full motion. Also with excellent fit on  her tibia. This was removed as was the femoral trial. A curette was  used to remove any remaining cartilage from the tibia. The tibial  cutting block was then placed and there was a perfect fit on the tibial  surface. The appropriate slope was placed and it was pinned in  position. The reciprocating saw was used to make the central cut and  then the oscillating saw used to make the horizontal cut. The bone  fragment was then removed. The tibial trial was placed and had perfect  fit on the tibia. We then drilled the 2 lug holes and did the keel punch.  We then placed tibia trial femur, and a 6 mm trial insert. There was  excellent stability throughout full range of motion and no impingement.  The trial was then removed. We drilled small holes in the distal  femur in order to create more conduits for the cement. The cut bone  surfaces were thoroughly irrigated with pulsatile lavage  while the  cement was mixed on the back table. We then cemented the tibial  component into place, impacted it and removed the extruded cement. The  same was done for the femoral component. Trial 6-mm inserts placed,  knee held in full extension, and all extruded cement removed. While the  cement was hardening, I injected the  extensor mechanism, periosteum of  the femur and subcu tissues, a total of 20 mL of Exparel mixed with 30  mL of saline and then did an additional injection of 20 mL of 0.25%  Marcaine into the same tissues. When the cement had fully hardened,  then the permanent polyethylene was placed in tibial tray. There was  excellent stability throughout full range of motion with no lift off the  component and no evidence of any impingement. Wound was copiously  irrigated with saline solution, and the arthrotomy closed over a Hemovac  drain with a running #1 V-Loc suture. The subcutaneous was closed with  interrupted 2-0 Vicryl and subcuticular running 4-0 Monocryl. The drain  was hooked to suction. Incision cleaned and dried and Steri-Strips and  a bulky sterile dressing applied. The tourniquet was released after a  total time of 29 minutes. This was done after closing the extensor  mechanism. The wound was closed and a bulky sterile dressing was  applied. She was placed into a knee immobilizer, awakened and  transported to recovery room in stable condition.  Please note that a surgical assistant was a medical necessity for this  procedure in order to perform it in a safe and expeditious manner.  Assistance was necessary for retracting vital ligaments, neurovascular  structures, as well as for proper positioning of the limb to allow for  appropriate bone cuts and appropriate placement of the prosthesis.    Dione Plover Mame Twombly, MD

## 2015-04-26 NOTE — H&P (View-Only) (Signed)
[name] DOB: Jul 23, 1945 Married / Language: English / Race: White Male Date of Admission:  04/26/2015 CC:  Right knee paon History of Present Illness The patient is a 70 year old male who comes in for a preoperative History and Physical. The patient is scheduled for a right unicompartmental knee replacement to be performed by Dr. Dione Plover. Aluisio, MD at [facility] on 04-26-2015. The patient is a 70 year old male who presented for follow up of their knee. The patient is being followed for their right knee pain and osteoarthritis. Symptoms reported include: pain, aching and pain with weightbearing. The patient feels that they are doing poorly and report their pain level to be mild. Current treatment includes: NSAIDs (Voltaren gel). Unfortunately, the injections did not provide any benefit for him. He has had cortisone and viscosupplements in the past. He continues with pinpoint medial-sided pain. He says it is getting worse and it is providing further limitations as to what he can and cannot do. He is not having any instability symptoms, just has pain which is worse with activity than it is at rest. He generally can rest well and sleep well. He would like to be more active, but the knee is preventing him from doing so. He states he has put on a lot of weight due to need for steroids, utilizing a treatment of his prostate cancer, but he cannot lose the weight because he cannot be active. He is ready to proceed with surgery. They have been treated conservatively in the past for the above stated problem and despite conservative measures, they continue to have progressive pain and severe functional limitations and dysfunction. They have failed non-operative management including home exercise, medications, and injections. It is felt that they would benefit from undergoing total joint replacement. Risks and benefits of the procedure have been discussed with the patient and they elect to proceed  with surgery. There are no active contraindications to surgery such as ongoing infection or rapidly progressive neurological disease.  Problem List/Past Medical  Chronic pain of right knee (M25.561)  Primary osteoarthritis of right knee (M17.11)  Gastroesophageal Reflux Disease  Hiatal Hernia  Gout  High blood pressure  Hypercholesterolemia  Kidney Stone  Prostate Cancer  Prostate Disease  Skin Cancer  Sleep Apnea  No CPAP Ulcer disease  Gastric Ulcer  Past History History of Gastric Bleeding Ulcer  Coronary Artery Disease/Heart Disease  Nonobstructive per Cath June 2011 Aortic Atherosclerosis  COPD  Tubular Adenoma of Colon  Allergies Sulfa Antibiotics  Unknown RXN Morphine Sulfate (Concentrate) *ANALGESICS - OPIOID*  Nausea, Vomiting.  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 AmLODIPine Besylate (10MG  Tablet, Oral) Active. Bicalutamide (50MG  Tablet, Oral) Active. Carvedilol (25MG  Tablet, Oral) Active. CloNIDine HCl (0.1MG  Tablet, Oral) Active. Losartan Potassium (50MG  Tablet, Oral) Active. Oxybutynin Chloride (5MG  Tablet, Oral) Active. Spironolactone (25MG  Tablet, Oral) Active. Gabapentin (Oral) Specific strength unknown -  Active. Voltaren (1% Gel, Transdermal) Active.  Past Surgical History Arthroscopy of Knee   right Resection of Stomach  EGD with cauterization of bleeding ulcer  Aortic Stenting  Date: 09/2007. [facility] - Aortic Stent Graft Aotic Endograft Repair  Date: 2004. Dr. [name] Umbilical Hernia Repair  Arthroscopic Knee Surgery - Left  Prostate Biopsy  Date: 09/2011. Circumcision  Date: 09/2011.   Review of Systems General Not Present- Chills, Fatigue, Fever, Memory Loss, Night Sweats, Weight Gain and Weight Loss. Skin Not Present- Eczema, Hives, Itching, Lesions and Rash. HEENT Not Present- Dentures, Double Vision, Headache, Hearing Loss, Tinnitus and Visual Loss. Respiratory Present- Shortness of breath with exertion (moderate exertion). Not Present- Allergies, Chronic Cough, Coughing up blood and Shortness of breath at rest. Cardiovascular Not Present- Chest Pain, Difficulty Breathing Lying Down, Murmur, Palpitations, Racing/skipping heartbeats and Swelling. Gastrointestinal Not Present- Abdominal Pain, Bloody Stool, Constipation, Diarrhea, Difficulty Swallowing, Heartburn, Jaundice, Loss of appetitie, Nausea and Vomiting. Male Genitourinary Not Present- Blood in Urine, Discharge, Flank Pain, Incontinence, Painful Urination, Urgency, Urinary frequency, Urinary Retention, Urinating at Night and Weak urinary stream. Musculoskeletal Present- Joint Pain (knee pain). Not Present- Back Pain, Joint Swelling, Morning Stiffness, Muscle Pain, Muscle Weakness and Spasms. Neurological Not Present- Blackout spells, Difficulty with balance, Dizziness, Paralysis, Tremor and Weakness. Psychiatric Not Present- Insomnia.  Vitals Height: 68in Height was reported by patient. BP: 152/88 (Sitting, Right Arm, Standard)   Physical Exam General Mental Status -Alert, cooperative and good historian. General Appearance-pleasant, Not in acute distress. Orientation-Oriented X3. Build & Nutrition-Well nourished and Well developed.  Head and Neck Head-normocephalic, atraumatic . Neck Global  Assessment - supple, no bruit auscultated on the right, no bruit auscultated on the left.  Eye Vision-Wears corrective lenses. Pupil - Bilateral-Regular and Round. Motion - Bilateral-EOMI.  Chest and Lung Exam Auscultation Breath sounds - clear at anterior chest wall and clear at posterior chest wall. Adventitious sounds - No Adventitious sounds.  Cardiovascular Auscultation Rhythm - Regular rate and rhythm. Heart Sounds - S1 WNL and S2 WNL. Murmurs & Other Heart Sounds: Murmur 1 - Location - Aortic Area. Timing - Early systolic. Grade - II/VI.  Abdomen Inspection Contour - Generalized moderate distention. Palpation/Percussion Tenderness - Abdomen is non-tender to palpation. Rigidity (guarding) - Abdomen is soft. Auscultation Auscultation of the abdomen reveals - Bowel sounds normal.  Male Genitourinary Note: Not done, not pertinent to present illness   Musculoskeletal Note: On exam, well-developed male, alert and oriented, in no apparent distress. Evaluation of his right knee shows no effusion. His range of motion is about 0 to 130. He is very tender on the medial joint line. There is no lateral joint line tenderness or any instability noted.  IMAGING His radiographs, AP both knees and lateral of the right shows that he has got an osteochondral defect in the medial femoral condyle. He still has excellent preservation of his lateral and patellofemoral compartments. He is not bone-on- bone medial compartment, but has large osteochondral defect.  Assessment & Plan Primary osteoarthritis of right knee (M17.11)  Note:Surgical Plans: Right Unicompartmental Knee Replacement  Disposition: Home with wife  PCP: Dr. Alain Honey- Patient has been seen preoperatively and felt to be stable for surgery. Cards: Dr. [name] - Patient has been seen preoperatively and felt to be stable for surgery.  Topical TXA - CAD, Prostate Cancer  Anesthesia Issues: None  Signed  electronically by Joelene Millin, III PA-C

## 2015-04-27 DIAGNOSIS — K219 Gastro-esophageal reflux disease without esophagitis: Secondary | ICD-10-CM | POA: Diagnosis not present

## 2015-04-27 DIAGNOSIS — E7800 Pure hypercholesterolemia, unspecified: Secondary | ICD-10-CM | POA: Diagnosis not present

## 2015-04-27 DIAGNOSIS — M1711 Unilateral primary osteoarthritis, right knee: Secondary | ICD-10-CM | POA: Diagnosis not present

## 2015-04-27 DIAGNOSIS — I2510 Atherosclerotic heart disease of native coronary artery without angina pectoris: Secondary | ICD-10-CM | POA: Diagnosis not present

## 2015-04-27 DIAGNOSIS — M109 Gout, unspecified: Secondary | ICD-10-CM | POA: Diagnosis not present

## 2015-04-27 DIAGNOSIS — G4730 Sleep apnea, unspecified: Secondary | ICD-10-CM | POA: Diagnosis not present

## 2015-04-27 LAB — CBC
HCT: 36.9 % — ABNORMAL LOW (ref 39.0–52.0)
Hemoglobin: 11.8 g/dL — ABNORMAL LOW (ref 13.0–17.0)
MCH: 29.5 pg (ref 26.0–34.0)
MCHC: 32 g/dL (ref 30.0–36.0)
MCV: 92.3 fL (ref 78.0–100.0)
Platelets: 222 10*3/uL (ref 150–400)
RBC: 4 MIL/uL — ABNORMAL LOW (ref 4.22–5.81)
RDW: 15.1 % (ref 11.5–15.5)
WBC: 11.7 10*3/uL — ABNORMAL HIGH (ref 4.0–10.5)

## 2015-04-27 LAB — BASIC METABOLIC PANEL
Anion gap: 10 (ref 5–15)
BUN: 27 mg/dL — ABNORMAL HIGH (ref 6–20)
CO2: 23 mmol/L (ref 22–32)
Calcium: 8.9 mg/dL (ref 8.9–10.3)
Chloride: 108 mmol/L (ref 101–111)
Creatinine, Ser: 1.89 mg/dL — ABNORMAL HIGH (ref 0.61–1.24)
GFR calc Af Amer: 40 mL/min — ABNORMAL LOW (ref >60)
GFR calc non Af Amer: 34 mL/min — ABNORMAL LOW (ref >60)
Glucose, Bld: 151 mg/dL — ABNORMAL HIGH (ref 65–99)
Potassium: 4.8 mmol/L (ref 3.5–5.1)
Sodium: 141 mmol/L (ref 135–145)

## 2015-04-27 MED ORDER — OXYCODONE HCL 5 MG PO TABS: 5.0000 mg | ORAL_TABLET | ORAL | Status: DC | PRN

## 2015-04-27 MED ORDER — RIVAROXABAN 10 MG PO TABS: 10.0000 mg | ORAL_TABLET | Freq: Every day | ORAL | Status: DC

## 2015-04-27 MED ORDER — METHOCARBAMOL 500 MG PO TABS: 500.0000 mg | ORAL_TABLET | Freq: Four times a day (QID) | ORAL | Status: DC | PRN

## 2015-04-27 MED ORDER — TRAMADOL HCL 50 MG PO TABS: 50.0000 mg | ORAL_TABLET | Freq: Four times a day (QID) | ORAL | Status: DC | PRN

## 2015-04-27 NOTE — Care Management Note (Signed)
Case Management Note  Patient Details  Name: [name] MRN: [id number] Date of Birth: [birth year]  Subjective/Objective:                  RIGHT KNEE MEDIAL UNICOMPARTMENTAL ARTHROPLASTY (Right) Action/Plan: Discharge planning Expected Discharge Date:  04/27/15               Expected Discharge Plan:  [place]  In-House Referral:     Discharge planning Services  CM Consult  Post Acute Care Choice:  Home Health Choice offered to:  Patient  DME Arranged:  3-N-1, Walker rolling DME Agency:  [place]:  PT Index Agency:  [facility]  Status of Service:  Completed, signed off  Medicare Important Message Given:    Date Medicare IM Given:    Medicare IM give by:    Date Additional Medicare IM Given:    Additional Medicare Important Message give by:     If discussed at [place] of Stay Meetings, dates discussed:    Additional Comments: CM met with pt in room to offer choice of home health agency.  Pt chooses [facility] to render HHPT.  Referral given to [employer] (on unit).  CM called [facility] DME rep, Lecretia to please deliver bari rolling walker and bari 3n1 to room so pt can discharge today.  No other CM needs were communicated. Dellie Catholic, RN 04/27/2015, 11:13 AM

## 2015-04-27 NOTE — Progress Notes (Signed)
   Subjective: 1 Day Post-Op Procedure(s) (LRB): RIGHT KNEE MEDIAL UNICOMPARTMENTAL ARTHROPLASTY (Right) Patient reports pain as mild.   Patient seen in rounds with Dr. Wynelle Link. Patient is well, and has had no acute complaints or problems Patient is ready to go home  Objective: Vital signs in last 24 hours: Temp:  [97 F (36.1 C)-98.1 F (36.7 C)] 97.2 F (36.2 C) (01/24 0503) Pulse Rate:  [58-76] 63 (01/24 0854) Resp:  [15-20] 16 (01/24 0503) BP: (106-144)/(50-81) 107/50 mmHg (01/24 0854) SpO2:  [93 %-100 %] 96 % (01/24 0503)  Intake/Output from previous day:  Intake/Output Summary (Last 24 hours) at 04/27/15 0934 Last data filed at 04/27/15 0912  Gross per 24 hour  Intake 3664.67 ml  Output   2260 ml  Net 1404.67 ml    Intake/Output this shift: Total I/O In: -  Out: 400 [Urine:400]  Labs:  Recent Labs  04/27/15 0455  HGB 11.8*    Recent Labs  04/27/15 0455  WBC 11.7*  RBC 4.00*  HCT 36.9*  PLT 222    Recent Labs  04/27/15 0455  NA 141  K 4.8  CL 108  CO2 23  BUN 27*  CREATININE 1.89*  GLUCOSE 151*  CALCIUM 8.9   No results for input(s): LABPT, INR in the last 72 hours.  EXAM: General - Patient is Alert and Appropriate Extremity - Neurovascular intact Sensation intact distally Dressing - clean, dry, no drainage Motor Function - intact, moving foot and toes well on exam.  Hemovac pulled without difficulty.  Assessment/Plan: 1 Day Post-Op Procedure(s) (LRB): RIGHT KNEE MEDIAL UNICOMPARTMENTAL ARTHROPLASTY (Right) Procedure(s) (LRB): RIGHT KNEE MEDIAL UNICOMPARTMENTAL ARTHROPLASTY (Right) Past Medical History  Diagnosis Date  . Coronary atherosclerosis of native coronary artery     Mild atherosclerosis at catheterization 2011  . Gout   . GERD (gastroesophageal reflux disease)   . Essential hypertension, benign   . COPD (chronic obstructive pulmonary disease) ([place])   . Tubular adenoma of colon 08/2008  . Prostate cancer ([place])    radiation tx  . Aortic atherosclerosis ([facility])     Penetrating aortic ulcer s/p endograft 2004 - followed by Dr. Trula Slade  . Shortness of breath dyspnea     with exertion  . Arthritis   . History of kidney stones   . Sleep apnea     Intolerant of CPAP  . Neuropathy ([place])     TOES   Active Problems:   OA (osteoarthritis) of knee  Estimated body mass index is 42.09 kg/(m^2) as calculated from the following:   Height as of this encounter: 5' 8.25" (1.734 m).   Weight as of this encounter: 126.554 kg (279 lb). Advance diet Up with therapy Discharge home with home health Diet - Cardiac diet Follow up - in 2 weeks Activity - WBAT Dressing - May remove the surgical dressing tomorrow at home and then apply a dry gauze dressing daily. May shower three days following surgery but do not submerge the incision under water. Disposition - Home Condition Upon Discharge - Good D/C Meds - See DC Summary DVT Prophylaxis Xarelto 10 mg daily for ten days, then change to Aspirin 325 mg daily for two weeks, then reduce to Baby Aspirin 81 mg daily for three additional weeks.  Arlee Muslim, PA-C Orthopaedic Surgery 04/27/2015, 9:34 AM

## 2015-04-27 NOTE — Evaluation (Signed)
Occupational Therapy Evaluation Patient Details Name: [name] MRN: [id number] DOB: November 14, 1945 Today's Date: 04/27/2015    History of Present Illness R medial knee replacement   Clinical Impression   This 70 year old man was admitted for the above surgery.  He was limited by pain and nausea during evaluation.  He will benefit from continued OT in acute setting and 24/7 at home.    Follow Up Recommendations  Supervision/Assistance - 24 hour    Equipment Recommendations  3 in 1 bedside comode    Recommendations for Other Services       Precautions / Restrictions Precautions Precautions: Knee Required Braces or Orthoses: Knee Immobilizer - Right Knee Immobilizer - Right: Discontinue once straight leg raise with < 10 degree lag Restrictions Weight Bearing Restrictions: No      Mobility Bed Mobility   Bed Mobility: Supine to Sit     Supine to sit: Min assist;HOB elevated     General bed mobility comments: assist for RLE  Transfers   Equipment used: Rolling walker (2 wheeled) Transfers: Sit to/from Stand Sit to Stand: Min assist         General transfer comment: cues for UE/LE placement    Balance                                            ADL Overall ADL's : Needs assistance/impaired     Grooming: Set up;Sitting   Upper Body Bathing: Set up;Sitting   Lower Body Bathing: Minimal assistance;Sit to/from stand   Upper Body Dressing : Set up;Sitting   Lower Body Dressing: Moderate assistance;Sit to/from stand   Toilet Transfer: Minimal assistance;RW;Ambulation   Toileting- Clothing Manipulation and Hygiene: Minimal assistance;Sit to/from stand         General ADL Comments: started to ambulate to bathroom and made it to door--then pt with increased pain and nausea.  Sat in chair and I demonstrated shower transfer--he is not ready for this yet. He will need a 3:1 commode at home.  He has a long brush to wash with      Vision     Perception     Praxis      Pertinent Vitals/Pain Pain Score:  (4-6) Pain Location: R knee and thigh Pain Descriptors / Indicators: Aching Pain Intervention(s): Limited activity within patient's tolerance;Monitored during session;Premedicated before session;Repositioned;Ice applied     Hand Dominance     Extremity/Trunk Assessment Upper Extremity Assessment Upper Extremity Assessment: Overall WFL for tasks assessed           Communication Communication Communication: No difficulties   Cognition Arousal/Alertness: Awake/alert Behavior During Therapy: WFL for tasks assessed/performed Overall Cognitive Status: Within Functional Limits for tasks assessed                     General Comments       Exercises       Shoulder Instructions      Home Living Family/patient expects to be discharged to:: Private residence Living Arrangements: Spouse/significant other Available Help at Discharge: Family Type of Home: House             Bathroom Shower/Tub: Walk-in [employer] treasurer Toilet: Standard     Home Equipment: None   Additional Comments: wife will be home 24/7 for a week; has 2 built in seats in shower  Prior Functioning/Environment Level of Independence: Independent             OT Diagnosis: Acute pain   OT Problem List: Decreased activity tolerance;Pain;Decreased knowledge of use of DME or AE   OT Treatment/Interventions: Self-care/ADL training;DME and/or AE instruction;Patient/family education    OT Goals(Current goals can be found in the care plan section) Acute Rehab OT Goals Patient Stated Goal: decreased pain OT Goal Formulation: With patient Time For Goal Achievement: 05/04/15 Potential to Achieve Goals: Good ADL Goals Pt Will Perform Grooming: with supervision;standing Pt Will Transfer to Toilet: with min guard assist;ambulating;bedside commode Additional ADL Goal #1: pt will verbalize shower sequence   OT Frequency: Min 2X/week   Barriers to D/C:            Co-evaluation              End of Session    Activity Tolerance: Patient limited by pain (and nausea) Patient left: in chair;with call bell/phone within reach   Time: 0812-0835 OT Time Calculation (min): 23 min Charges:  OT General Charges $OT Visit: 1 Procedure OT Evaluation $OT Eval Low Complexity: 1 Procedure G-Codes: OT G-codes **NOT FOR INPATIENT CLASS** Functional Assessment Tool Used: clinical judgment Functional Limitation: Self care Self Care Current Status [id number]): At least 40 percent but less than 60 percent impaired, limited or restricted Self Care Goal Status [id number]): At least 1 percent but less than 20 percent impaired, limited or restricted  [facility] 04/27/2015, 8:45 AM Lesle Chris, OTR/L [phone] 04/27/2015

## 2015-04-27 NOTE — Progress Notes (Signed)
Physical Therapy Treatment Patient Details Name: [name] MRN: [id number] DOB: 05-22-1945 Today's Date: 04/27/2015    History of Present Illness R medial knee replacement    PT Comments    POD # 1 am session. Pt stated he "feels bad".  "not able to eat" to breakfast.  Required + 2 assist for safety as pt c/o min dizziness/naudes.  Assisted with amb a limited distance with max encouragement and recliner following for safety.  Returned tio room then performed TE's followed by ICE.   Follow Up Recommendations  Home health PT     Equipment Recommendations  Rolling walker with 5" wheels    Recommendations for Other Services       Precautions / Restrictions Precautions Precautions: Knee Precaution Comments: instructed on KI use or amb and stairs Required Braces or Orthoses: Knee Immobilizer - Right Knee Immobilizer - Right: Discontinue once straight leg raise with < 10 degree lag Restrictions Weight Bearing Restrictions: No Other Position/Activity Restrictions: WBAT    Mobility  Bed Mobility               General bed mobility comments: OOB in recliner  Transfers Overall transfer level: Needs assistance Equipment used: Rolling walker (2 wheeled) Transfers: Sit to/from Stand Sit to Stand: Min assist;+2 safety/equipment         General transfer comment: pt very nervous and c/o feeling "bad".  Ambulation/Gait Ambulation/Gait assistance: Min guard;+2 safety/equipment Ambulation Distance (Feet): 18 Feet Assistive device: Rolling walker (2 wheeled) Gait Pattern/deviations: Step-to pattern;Decreased stance time - right Gait velocity: decreased   General Gait Details: 50% VC's on proper walker to self distance and safety with turns.  Limited amb distance due to increased c/o pain.    Stairs            Wheelchair Mobility    Modified Rankin (Stroke Patients Only)       Balance                                    Cognition  Arousal/Alertness: Awake/alert Behavior During Therapy: WFL for tasks assessed/performed Overall Cognitive Status: Within Functional Limits for tasks assessed                      Exercises   Unil Knee Replacement TE's 10 reps B LE ankle pumps 10 reps towel squeezes 10 reps knee presses 10 reps heel slides  10 reps SAQ's 10 reps SLR's 10 reps ABD Followed by ICE    General Comments        Pertinent Vitals/Pain Pain Assessment: 0-10 Pain Score: 3  Pain Location: R knee Pain Descriptors / Indicators: Constant;Sore;Discomfort;Grimacing Pain Intervention(s): Monitored during session;Premedicated before session;Repositioned;Ice applied    Home Living                      Prior Function            PT Goals (current goals can now be found in the care plan section) Progress towards PT goals: Progressing toward goals    Frequency  7X/week    PT Plan Current plan remains appropriate    Co-evaluation             End of Session Equipment Utilized During Treatment: Right knee immobilizer Activity Tolerance: Patient tolerated treatment well Patient left: in chair;with call bell/phone within reach     Time: 0930-1010 PT Time  Calculation (min) (ACUTE ONLY): 40 min  Charges:  $Gait Training: 8-22 mins $Therapeutic Exercise: 8-22 mins $Therapeutic Activity: 8-22 mins                    G Codes:      Rica Koyanagi  PTA [facility]  Acute  Rehab Pager      [phone]

## 2015-04-27 NOTE — Discharge Instructions (Addendum)
Information on my medicine - XARELTO (Rivaroxaban)  This medication education was reviewed with me or my healthcare representative as part of my discharge preparation.  The pharmacist that spoke with me during my hospital stay was:  Hershal Coria, [facility]  Why was Xarelto prescribed for you? Xarelto was prescribed for you to reduce the risk of blood clots forming after orthopedic surgery. The medical term for these abnormal blood clots is venous thromboembolism (VTE).  What do you need to know about xarelto ? Take your Xarelto ONCE DAILY at the same time every day. You may take it either with or without food.  If you have difficulty swallowing the tablet whole, you may crush it and mix in applesauce just prior to taking your dose.  Take Xarelto exactly as prescribed by your doctor and DO NOT stop taking Xarelto without talking to the doctor who prescribed the medication.  Stopping without other VTE prevention medication to take the place of Xarelto may increase your risk of developing a clot.  After discharge, you should have regular check-up appointments with your healthcare provider that is prescribing your Xarelto.    What do you do if you miss a dose? If you miss a dose, take it as soon as you remember on the same day then continue your regularly scheduled once daily regimen the next day. Do not take two doses of Xarelto on the same day.   Important Safety Information A possible side effect of Xarelto is bleeding. You should call your healthcare provider right away if you experience any of the following: ? Bleeding from an injury or your nose that does not stop. ? Unusual colored urine (red or dark brown) or unusual colored stools (red or black). ? Unusual bruising for unknown reasons. ? A serious fall or if you hit your head (even if there is no bleeding).  Some medicines may interact with Xarelto and might increase your risk of bleeding while on Xarelto. To help avoid this,  consult your healthcare provider or pharmacist prior to using any new prescription or non-prescription medications, including herbals, vitamins, non-steroidal anti-inflammatory drugs (NSAIDs) and supplements.  This website has more information on Xarelto: [email or web address]     Dr. Gaynelle Arabian [facility] [address], [place], [place] [address] [phone]  UNI KNEE REPLACEMENT POSTOPERATIVE DIRECTIONS   Knee Rehabilitation, Guidelines Following Surgery  Results after knee surgery are often greatly improved when you follow the exercise, range of motion and muscle strengthening exercises prescribed by your doctor. Safety measures are also important to protect the knee from further injury. Any time any of these exercises cause you to have increased pain or swelling in your knee joint, decrease the amount until you are comfortable again and slowly increase them. If you have problems or questions, call your caregiver or physical therapist for advice.   HOME CARE INSTRUCTIONS  Remove items at home which could result in a fall. This includes throw rugs or furniture in walking pathways.   ICE to the affected knee every three hours for 30 minutes at a time and then as needed for pain and swelling.  Continue to use ice on the knee for pain and swelling from surgery. You may notice swelling that will progress down to the foot and ankle.  This is normal after surgery.  Elevate the leg when you are not up walking on it.    Continue to use the breathing machine which will help keep your temperature down.  It is common for your temperature to cycle up and down following surgery, especially at night when you are not up moving around and exerting yourself.  The breathing machine keeps your lungs expanded and your temperature down.  Do not place pillow under knee, focus on keeping the knee straight while resting  DIET You may resume your previous home  diet once your are discharged from the hospital.  DRESSING / WOUND CARE / SHOWERING You may shower 3 days after surgery, but keep the wounds dry during showering.  You may use an occlusive plastic wrap (Press'n Seal for example), NO SOAKING/SUBMERGING IN THE BATHTUB.  If the bandage gets wet, change with a clean dry gauze.  If the incision gets wet, pat the wound dry with a clean towel. You may start showering once you are discharged home but do not submerge the incision under water. Just pat the incision dry and apply a dry gauze dressing on daily. Change the surgical dressing daily and reapply a dry dressing each time.  ACTIVITY Walk with your walker as instructed. Use walker as long as suggested by your caregivers. Avoid periods of inactivity such as sitting longer than an hour when not asleep. This helps prevent blood clots.  You may resume a sexual relationship in one month or when given the OK by your doctor.  You may return to work once you are cleared by your doctor.  Do not drive a car for 6 weeks or until released by you surgeon.  Do not drive while taking narcotics.  WEIGHT BEARING Weight bearing as tolerated with assist device (walker, cane, etc) as directed, use it as long as suggested by your surgeon or therapist, typically at least 4-6 weeks.  POSTOPERATIVE CONSTIPATION PROTOCOL Constipation - defined medically as fewer than three stools per week and severe constipation as less than one stool per week.  One of the most common issues patients have following surgery is constipation.  Even if you have a regular bowel pattern at home, your normal regimen is likely to be disrupted due to multiple reasons following surgery.  Combination of anesthesia, postoperative narcotics, change in appetite and fluid intake all can affect your bowels.  In order to avoid complications following surgery, here are some recommendations in order to help you during your recovery period.  Colace  (docusate) - Pick up an over-the-counter form of Colace or another stool softener and take twice a day as long as you are requiring postoperative pain medications.  Take with a full glass of water daily.  If you experience loose stools or diarrhea, hold the colace until you stool forms back up.  If your symptoms do not get better within 1 week or if they get worse, check with your doctor.  Dulcolax (bisacodyl) - Pick up over-the-counter and take as directed by the product packaging as needed to assist with the movement of your bowels.  Take with a full glass of water.  Use this product as needed if not relieved by Colace only.   MiraLax (polyethylene glycol) - Pick up over-the-counter to have on hand.  MiraLax is a solution that will increase the amount of water in your bowels to assist with bowel movements.  Take as directed and can mix with a glass of water, juice, soda, coffee, or tea.  Take if you go more than two days without a movement. Do not use MiraLax more than once per day. Call your doctor if you are still constipated or irregular after  using this medication for 7 days in a row.  If you continue to have problems with postoperative constipation, please contact the office for further assistance and recommendations.  If you experience "the worst abdominal pain ever" or develop nausea or vomiting, please contact the office immediatly for further recommendations for treatment.  ITCHING  If you experience itching with your medications, try taking only a single pain pill, or even half a pain pill at a time.  You can also use Benadryl over the counter for itching or also to help with sleep.   TED HOSE STOCKINGS Wear the elastic stockings on both legs for three weeks following surgery during the day but you may remove then at night for sleeping.  MEDICATIONS See your medication summary on the After Visit Summary that the nursing staff will review with you prior to discharge.  You may have some  home medications which will be placed on hold until you complete the course of blood thinner medication.  It is important for you to complete the blood thinner medication as prescribed by your surgeon.  Continue your approved medications as instructed at time of discharge.  PRECAUTIONS If you experience chest pain or shortness of breath - call 911 immediately for transfer to the hospital emergency department.  If you develop a fever greater that 101 F, purulent drainage from wound, increased redness or drainage from wound, foul odor from the wound/dressing, or calf pain - CONTACT YOUR SURGEON.                                                   FOLLOW-UP APPOINTMENTS Make sure you keep all of your appointments after your operation with your surgeon and caregivers. You should call the office at the above phone number and make an appointment for approximately two weeks after the date of your surgery or on the date instructed by your surgeon outlined in the "After Visit Summary".  RANGE OF MOTION AND STRENGTHENING EXERCISES  Rehabilitation of the knee is important following a knee injury or an operation. After just a few days of immobilization, the muscles of the thigh which control the knee become weakened and shrink (atrophy). Knee exercises are designed to build up the tone and strength of the thigh muscles and to improve knee motion. Often times heat used for twenty to thirty minutes before working out will loosen up your tissues and help with improving the range of motion but do not use heat for the first two weeks following surgery. These exercises can be done on a training (exercise) mat, on the floor, on a table or on a bed. Use what ever works the best and is most comfortable for you Knee exercises include:  Leg Lifts - While your knee is still immobilized in a splint or cast, you can do straight leg raises. Lift the leg to 60 degrees, hold for 3 sec, and slowly lower the leg. Repeat 10-20 times 2-3  times daily. Perform this exercise against resistance later as your knee gets better.  Quad and Hamstring Sets - Tighten up the muscle on the front of the thigh (Quad) and hold for 5-10 sec. Repeat this 10-20 times hourly. Hamstring sets are done by pushing the foot backward against an object and holding for 5-10 sec. Repeat as with quad sets.   Leg Slides: Lying on  your back, slowly slide your foot toward your buttocks, bending your knee up off the floor (only go as far as is comfortable). Then slowly slide your foot back down until your leg is flat on the floor again.  Angel Wings: Lying on your back spread your legs to the side as far apart as you can without causing discomfort.  A rehabilitation program following serious knee injuries can speed recovery and prevent re-injury in the future due to weakened muscles. Contact your doctor or a physical therapist for more information on knee rehabilitation.   IF YOU ARE TRANSFERRED TO A SKILLED REHAB FACILITY If the patient is transferred to a skilled rehab facility following release from the hospital, a list of the current medications will be sent to the facility for the patient to continue.  When discharged from the skilled rehab facility, please have the facility set up the patient's [place] prior to being released. Also, the skilled facility will be responsible for providing the patient with their medications at time of release from the facility to include their pain medication, the muscle relaxants, and their blood thinner medication. If the patient is still at the rehab facility at time of the two week follow up appointment, the skilled rehab facility will also need to assist the patient in arranging follow up appointment in our office and any transportation needs.  MAKE SURE YOU:  Understand these instructions.  Get help right away if you are not doing well or get worse.    Pick up stool softner and laxative for home use  following surgery while on pain medications. Do not submerge incision under water. Please use good hand washing techniques while changing dressing each day. May shower starting three days after surgery. Please use a clean towel to pat the incision dry following showers. Continue to use ice for pain and swelling after surgery. Do not use any lotions or creams on the incision until instructed by your surgeon.   Take Xarelto 10 mg daily for ten days, then change to Aspirin 325 mg daily for two weeks, then reduce to Baby Aspirin 81 mg daily for three additional weeks.

## 2015-04-27 NOTE — Discharge Summary (Signed)
Physician Discharge Summary   Patient ID: CHUCK CABAN MRN: [id number] DOB/AGE: 71/18/1947 71 y.o.  Admit date: 04/26/2015 Discharge date: 04-27-15  Primary Diagnosis:  Medial compartment osteoarthritis, Right knee  Admission Diagnoses:  Past Medical History  Diagnosis Date  . Coronary atherosclerosis of native coronary artery     Mild atherosclerosis at catheterization 2011  . Gout   . GERD (gastroesophageal reflux disease)   . Essential hypertension, benign   . COPD (chronic obstructive pulmonary disease) ([place])   . Tubular adenoma of colon 08/2008  . Prostate cancer ([place])     radiation tx  . Aortic atherosclerosis ([facility])     Penetrating aortic ulcer s/p endograft 2004 - followed by Dr. Trula Slade  . Shortness of breath dyspnea     with exertion  . Arthritis   . History of kidney stones   . Sleep apnea     Intolerant of CPAP  . Neuropathy ([place])     TOES   Discharge Diagnoses:   Active Problems:   OA (osteoarthritis) of knee  Estimated body mass index is 42.09 kg/(m^2) as calculated from the following:   Height as of this encounter: 5' 8.25" (1.734 m).   Weight as of this encounter: 126.554 kg (279 lb).  Procedure:  Procedure(s) (LRB): RIGHT KNEE MEDIAL UNICOMPARTMENTAL ARTHROPLASTY (Right)   Consults: None  HPI: [name] is a 70 y.o. male, who has  significant isolated medial compartment arthritis of the Right knee. He has had nonoperative management including injections.Unfortunately, the pain persists.  Radiograph showed isolated medial compartment arthritis with a large osteochondral defect with normal-appearing patellofemoral and lateral compartments. He  presents now for left knee unicompartmental arthroplasty.   Laboratory Data: Admission on 04/26/2015  Component Date Value Ref Range Status  . WBC 04/27/2015 11.7* 4.0 - 10.5 K/uL Final  . RBC 04/27/2015 4.00* 4.22 - 5.81 MIL/uL Final  . Hemoglobin 04/27/2015 11.8* 13.0 - 17.0 g/dL Final  .  HCT 04/27/2015 36.9* 39.0 - 52.0 % Final  . MCV 04/27/2015 92.3  78.0 - 100.0 fL Final  . MCH 04/27/2015 29.5  26.0 - 34.0 pg Final  . MCHC 04/27/2015 32.0  30.0 - 36.0 g/dL Final  . RDW 04/27/2015 15.1  11.5 - 15.5 % Final  . Platelets 04/27/2015 222  150 - 400 K/uL Final  . Sodium 04/27/2015 141  135 - 145 mmol/L Final  . Potassium 04/27/2015 4.8  3.5 - 5.1 mmol/L Final  . Chloride 04/27/2015 108  101 - 111 mmol/L Final  . CO2 04/27/2015 23  22 - 32 mmol/L Final  . Glucose, Bld 04/27/2015 151* 65 - 99 mg/dL Final  . BUN 04/27/2015 27* 6 - 20 mg/dL Final  . Creatinine, Ser 04/27/2015 1.89* 0.61 - 1.24 mg/dL Final  . Calcium 04/27/2015 8.9  8.9 - 10.3 mg/dL Final  . GFR calc non Af Amer 04/27/2015 34* >60 mL/min Final  . GFR calc Af Amer 04/27/2015 40* >60 mL/min Final   Comment: (NOTE) The eGFR has been calculated using the CKD EPI equation. This calculation has not been validated in all clinical situations. eGFR's persistently <60 mL/min signify possible Chronic Kidney Disease.   Georgiann Hahn gap 04/27/2015 10  5 - 15 Final  Hospital Outpatient Visit on 04/20/2015  Component Date Value Ref Range Status  . aPTT 04/20/2015 42* 24 - 37 seconds Final   Comment:        IF BASELINE aPTT IS ELEVATED, SUGGEST PATIENT RISK ASSESSMENT BE USED TO DETERMINE  APPROPRIATE ANTICOAGULANT THERAPY.   . WBC 04/20/2015 5.4  4.0 - 10.5 K/uL Final  . RBC 04/20/2015 4.21* 4.22 - 5.81 MIL/uL Final  . Hemoglobin 04/20/2015 12.5* 13.0 - 17.0 g/dL Final  . HCT 04/20/2015 38.6* 39.0 - 52.0 % Final  . MCV 04/20/2015 91.7  78.0 - 100.0 fL Final  . MCH 04/20/2015 29.7  26.0 - 34.0 pg Final  . MCHC 04/20/2015 32.4  30.0 - 36.0 g/dL Final  . RDW 04/20/2015 15.5  11.5 - 15.5 % Final  . Platelets 04/20/2015 212  150 - 400 K/uL Final  . Sodium 04/20/2015 140  135 - 145 mmol/L Final  . Potassium 04/20/2015 4.4  3.5 - 5.1 mmol/L Final  . Chloride 04/20/2015 110  101 - 111 mmol/L Final  . CO2 04/20/2015 22  22 - 32  mmol/L Final  . Glucose, Bld 04/20/2015 96  65 - 99 mg/dL Final  . BUN 04/20/2015 22* 6 - 20 mg/dL Final  . Creatinine, Ser 04/20/2015 1.82* 0.61 - 1.24 mg/dL Final  . Calcium 04/20/2015 8.7* 8.9 - 10.3 mg/dL Final  . Total Protein 04/20/2015 7.2  6.5 - 8.1 g/dL Final  . Albumin 04/20/2015 4.1  3.5 - 5.0 g/dL Final  . AST 04/20/2015 16  15 - 41 U/L Final  . ALT 04/20/2015 16* 17 - 63 U/L Final  . Alkaline Phosphatase 04/20/2015 70  38 - 126 U/L Final  . Total Bilirubin 04/20/2015 0.6  0.3 - 1.2 mg/dL Final  . GFR calc non Af Amer 04/20/2015 36* >60 mL/min Final  . GFR calc Af Amer 04/20/2015 42* >60 mL/min Final   Comment: (NOTE) The eGFR has been calculated using the CKD EPI equation. This calculation has not been validated in all clinical situations. eGFR's persistently <60 mL/min signify possible Chronic Kidney Disease.   . Anion gap 04/20/2015 8  5 - 15 Final  . Prothrombin Time 04/20/2015 15.0  11.6 - 15.2 seconds Final  . INR 04/20/2015 1.17  0.00 - 1.49 Final  . ABO/RH(D) 04/20/2015 B POS   Final  . Antibody Screen 04/20/2015 NEG   Final  . Sample Expiration 04/20/2015 04/29/2015   Final  . Extend sample reason 04/20/2015 NO TRANSFUSIONS OR PREGNANCY IN THE PAST 3 MONTHS   Final  . Color, Urine 04/20/2015 YELLOW  YELLOW Final  . APPearance 04/20/2015 CLEAR  CLEAR Final  . Specific Gravity, Urine 04/20/2015 1.015  1.005 - 1.030 Final  . pH 04/20/2015 5.5  5.0 - 8.0 Final  . Glucose, UA 04/20/2015 NEGATIVE  NEGATIVE mg/dL Final  . Hgb urine dipstick 04/20/2015 NEGATIVE  NEGATIVE Final  . Bilirubin Urine 04/20/2015 NEGATIVE  NEGATIVE Final  . Ketones, ur 04/20/2015 NEGATIVE  NEGATIVE mg/dL Final  . Protein, ur 04/20/2015 NEGATIVE  NEGATIVE mg/dL Final  . Nitrite 04/20/2015 NEGATIVE  NEGATIVE Final  . Leukocytes, UA 04/20/2015 NEGATIVE  NEGATIVE Final   MICROSCOPIC NOT DONE ON URINES WITH NEGATIVE PROTEIN, BLOOD, LEUKOCYTES, NITRITE, OR GLUCOSE <1000 mg/dL.  [name] MRSA, PCR  04/20/2015 NEGATIVE  NEGATIVE Final  . Staphylococcus aureus 04/20/2015 NEGATIVE  NEGATIVE Final   Comment:        The Xpert SA Assay (FDA approved for NASAL specimens in patients over 108 years of age), is one component of a comprehensive surveillance program.  Test performance has been validated by [facility] for patients greater than or equal to 42 year old. It is not intended to diagnose infection nor to guide or monitor treatment.   [name]  ABO/RH(D) 04/20/2015 B POS   Final  Office Visit on 03/17/2015  Component Date Value Ref Range Status  . Glucose 03/17/2015 84  65 - 99 mg/dL Final  . BUN 03/17/2015 20  8 - 27 mg/dL Final  . Creatinine, Ser 03/17/2015 1.62* 0.76 - 1.27 mg/dL Final  . GFR calc non Af Amer 03/17/2015 42* >59 mL/min/1.73 Final  . GFR calc Af Amer 03/17/2015 49* >59 mL/min/1.73 Final  . BUN/Creatinine Ratio 03/17/2015 12  10 - 22 Final  . Sodium 03/17/2015 141  134 - 144 mmol/L Final                 **Please note reference interval change**  . Potassium 03/17/2015 5.0  3.5 - 5.2 mmol/L Final  . Chloride 03/17/2015 102  96 - 106 mmol/L Final                 **Please note reference interval change**  . CO2 03/17/2015 24  18 - 29 mmol/L Final  . Calcium 03/17/2015 9.5  8.6 - 10.2 mg/dL Final     X-Rays:No results found.  EKG: Orders placed or performed in visit on 04/02/15  . EKG 12-Lead     Hospital Course: GARON MELANDER is a 70 y.o. who was admitted to [facility]. They were brought to the operating room on 04/26/2015 and underwent Procedure(s): RIGHT KNEE MEDIAL UNICOMPARTMENTAL ARTHROPLASTY.  Patient tolerated the procedure well and was later transferred to the recovery room and then to the orthopaedic floor for postoperative care.  They were given PO and IV analgesics for pain control following their surgery.  They were given 24 hours of postoperative antibiotics of  Anti-infectives    Start     Dose/Rate Route Frequency Ordered Stop    04/26/15 1530  ceFAZolin (ANCEF) IVPB 2 g/50 mL premix     2 g 100 mL/hr over 30 Minutes Intravenous Every 6 hours 04/26/15 1136 04/26/15 2124   04/26/15 0915  ceFAZolin (ANCEF) 3 g in dextrose 5 % 50 mL IVPB     3 g 130 mL/hr over 30 Minutes Intravenous On call to O.R. 04/26/15 0903 04/26/15 0930   04/25/15 1030  ceFAZolin (ANCEF) 3 g in dextrose 5 % 50 mL IVPB     3 g 130 mL/hr over 30 Minutes Intravenous On call to O.R. 04/25/15 1017 04/26/15 0559     and started on DVT prophylaxis in the form of Xarelto.   PT and OT were ordered for postop therapy protocol.  Discharge planning consulted to help with postop disposition and equipment needs.  Patient had a good night on the evening of surgery.  They started to get up OOB with therapy on day one. Hemovac drain was pulled without difficulty.  Patient was seen in rounds on day one and it was felt that as long as they did well with the remaining sessions of therapy that they would be ready to go home.  Arrangements were made and they were setup to go home on POD 1.  Discharge home with home health Diet - Cardiac diet Follow up - in 2 weeks Activity - WBAT Dressing - May remove the surgical dressing tomorrow at home and then apply a dry gauze dressing daily. May shower three days following surgery but do not submerge the incision under water. Disposition - Home Condition Upon Discharge - Good D/C Meds - See DC Summary DVT Prophylaxis Xarelto 10 mg daily for ten days, then change to Aspirin 325  mg daily for two weeks, then reduce to Baby Aspirin 81 mg daily for three additional weeks.  Discharge Instructions    Call MD / Call 911    Complete by:  As directed   If you experience chest pain or shortness of breath, CALL 911 and be transported to the hospital emergency room.  If you develope a fever above 101 F, pus (white drainage) or increased drainage or redness at the wound, or calf pain, call your surgeon's office.     Change dressing     Complete by:  As directed   Change dressing daily with sterile 4 x 4 inch gauze dressing and apply TED hose. Do not submerge the incision under water.     Constipation Prevention    Complete by:  As directed   Drink plenty of fluids.  Prune juice may be helpful.  You may use a stool softener, such as Colace (over the counter) 100 mg twice a day.  Use MiraLax (over the counter) for constipation as needed.     Diet - low sodium heart healthy    Complete by:  As directed      Discharge instructions    Complete by:  As directed   Pick up stool softner and laxative for home use following surgery while on pain medications. Do not submerge incision under water. May remove the surgical dressing tomorrow, Wednesday 04/28/2015, and then apply a dry gauze dressing daily. Please use good hand washing techniques while changing dressing each day. May shower starting three days after surgery starting Thursday 04/29/2015. Please use a clean towel to pat the incision dry following showers. Continue to use ice for pain and swelling after surgery. Do not use any lotions or creams on the incision until instructed by your surgeon.  Postoperative Constipation Protocol  Constipation - defined medically as fewer than three stools per week and severe constipation as less than one stool per week.  One of the most common issues patients have following surgery is constipation. Even if you have a regular bowel pattern at home, your normal regimen is likely to be disrupted due to multiple reasons following surgery. Combination of anesthesia, postoperative narcotics, change in appetite and fluid intake all can affect your bowels. In order to avoid complications following surgery, here are some recommendations in order to help you during your recovery period.  Colace (docusate) - Pick up an over-the-counter form of Colace or another stool softener and take twice a day as long as you are requiring postoperative pain  medications. Take with a full glass of water daily. If you experience loose stools or diarrhea, hold the colace until you stool forms back up. If your symptoms do not get better within 1 week or if they get worse, check with your doctor.  Dulcolax (bisacodyl) - Pick up over-the-counter and take as directed by the product packaging as needed to assist with the movement of your bowels. Take with a full glass of water. Use this product as needed if not relieved by Colace only.   MiraLax (polyethylene glycol) - Pick up over-the-counter to have on hand. MiraLax is a solution that will increase the amount of water in your bowels to assist with bowel movements. Take as directed and can mix with a glass of water, juice, soda, coffee, or tea. Take if you go more than two days without a movement. Do not use MiraLax more than once per day. Call your doctor if you are still constipated or irregular  after using this medication for 7 days in a row.  If you continue to have problems with postoperative constipation, please contact the office for further assistance and recommendations. If you experience "the worst abdominal pain ever" or develop nausea or vomiting, please contact the office immediatly for further recommendations for treatment.  Xarelto 10 mg daily for ten days, then change to Aspirin 325 mg daily for two weeks, then reduce to Baby Aspirin 81 mg daily for three additional weeks.     Do not put a pillow under the knee. Place it under the heel.    Complete by:  As directed      Do not sit on low chairs, stoools or toilet seats, as it may be difficult to get up from low surfaces    Complete by:  As directed      Driving restrictions    Complete by:  As directed   No driving until released by the physician.     Increase activity slowly as tolerated    Complete by:  As directed      Lifting restrictions    Complete by:  As directed   No lifting until released by the physician.     Patient  may shower    Complete by:  As directed   You may shower without a dressing once there is no drainage.  Do not wash over the wound.  If drainage remains, do not shower until drainage stops.     TED hose    Complete by:  As directed   Use stockings (TED hose) for 3 weeks on both leg(s).  You may remove them at night for sleeping.     Weight bearing as tolerated    Complete by:  As directed   Laterality:  right  Extremity:  Lower            Medication List    STOP taking these medications        diclofenac sodium 1 % Gel  Commonly known as:  VOLTAREN      TAKE these medications        allopurinol 300 MG tablet  Commonly known as:  ZYLOPRIM  TAKE ONE TABLET BY MOUTH ONCE DAILY     amLODipine 10 MG tablet  Commonly known as:  NORVASC  Take 1 tablet (10 mg total) by mouth daily.     bicalutamide 50 MG tablet  Commonly known as:  CASODEX  Take 50 mg by mouth daily.     cloNIDine 0.1 MG tablet  Commonly known as:  CATAPRES  Take 2 tablets (0.2 mg total) by mouth 2 (two) times daily.     gabapentin 300 MG capsule  Commonly known as:  NEURONTIN  Take 300 mg by mouth 3 (three) times daily.     ketoconazole 2 % cream  Commonly known as:  NIZORAL  Apply 1 application topically daily.     losartan 50 MG tablet  Commonly known as:  COZAAR  Take 1 tablet (50 mg total) by mouth daily.     methocarbamol 500 MG tablet  Commonly known as:  ROBAXIN  Take 1 tablet (500 mg total) by mouth every 6 (six) hours as needed for muscle spasms.     oxybutynin 5 MG tablet  Commonly known as:  DITROPAN  Take 5 mg by mouth daily.     oxyCODONE 5 MG immediate release tablet  Commonly known as:  Oxy IR/ROXICODONE  Take 1-2 tablets (5-10 mg total)  by mouth every 3 (three) hours as needed for moderate pain, severe pain or breakthrough pain.     pantoprazole 40 MG tablet  Commonly known as:  PROTONIX  TAKE ONE TABLET BY MOUTH TWICE DAILY     rivaroxaban 10 MG Tabs tablet  Commonly  known as:  XARELTO  Take 1 tablet (10 mg total) by mouth daily with breakfast. Xarelto 10 mg daily for ten days, then change to Aspirin 325 mg daily for two weeks, then reduce to Baby Aspirin 81 mg daily for three additional weeks.     spironolactone 25 MG tablet  Commonly known as:  ALDACTONE  TAKE ONE-HALF TABLET BY MOUTH ONCE DAILY     traMADol 50 MG tablet  Commonly known as:  ULTRAM  Take 1-2 tablets (50-100 mg total) by mouth every 6 (six) hours as needed (mild pain).           Follow-up Information    Follow up with Gearlean Alf, MD. Schedule an appointment as soon as possible for a visit on 05/11/2015.   Specialty:  Orthopedic Surgery   Why:  Call office at [phone] to setup appointment with Dr. Wynelle Link.   Contact information:   [address] [place] [address] [phone]       Signed: Arlee Muslim, PA-C Orthopaedic Surgery 04/27/2015, 9:43 AM

## 2015-04-27 NOTE — Progress Notes (Signed)
Patient tolerated PT well. PRN pain medication given two times with relief. No s/s of acute distress. Patient being discharged home with [facility]. Equipment in the room, PT completed, IV removed intact. Wife at bedside.

## 2015-04-27 NOTE — Progress Notes (Signed)
Physical Therapy Treatment Patient Details Name: [name] MRN: [id number] DOB: [birth year] Today's Date: 04/27/2015    History of Present Illness R medial knee replacement    PT Comments    POD # 1 pm session Pt feeling better and eager to D/C to home.  Assisted with amb in hallway then practiced stairs with spouse.    Follow Up Recommendations  Home health PT     Equipment Recommendations  Rolling walker with 5" wheels    Recommendations for Other Services       Precautions / Restrictions Precautions Precautions: Knee Precaution Comments: instructed on KI use or amb and stairs Required Braces or Orthoses: Knee Immobilizer - Right Knee Immobilizer - Right: Discontinue once straight leg raise with < 10 degree lag Restrictions Weight Bearing Restrictions: No Other Position/Activity Restrictions: WBAT    Mobility  Bed Mobility               General bed mobility comments: OOB in recliner  Transfers Overall transfer level: Needs assistance Equipment used: Rolling walker (2 wheeled) Transfers: Sit to/from Stand Sit to Stand: Min assist;+2 safety/equipment         General transfer comment: pt very nervous and c/o feeling "bad".  Ambulation/Gait Ambulation/Gait assistance: Min guard;+2 safety/equipment Ambulation Distance (Feet): 22 Feet with spouse Assistive device: Rolling walker (2 wheeled) Gait Pattern/deviations: Step-to pattern;Decreased stance time - right Gait velocity: decreased   General Gait Details: 50% VC's on proper walker to self distance and safety with turns.  Limited amb distance due to increased c/o pain.    Stairs Stairs: Yes Stairs assistance: Min guard Stair Management: One rail Right;Step to pattern;Forwards;With crutches Number of Stairs: 2 General stair comments: with spouse present  Wheelchair Mobility    Modified Rankin (Stroke Patients Only)       Balance                                     Cognition Arousal/Alertness: Awake/alert Behavior During Therapy: WFL for tasks assessed/performed Overall Cognitive Status: Within Functional Limits for tasks assessed                      Exercises      General Comments        Pertinent Vitals/Pain Pain Assessment: 0-10 Pain Score: 3  Pain Location: R knee Pain Descriptors / Indicators: Constant;Sore;Discomfort;Grimacing Pain Intervention(s): Monitored during session;Premedicated before session;Repositioned;Ice applied    Home Living                      Prior Function            PT Goals (current goals can now be found in the care plan section) Progress towards PT goals: Progressing toward goals    Frequency  7X/week    PT Plan Current plan remains appropriate    Co-evaluation             End of Session Equipment Utilized During Treatment: Right knee immobilizer Activity Tolerance: Patient tolerated treatment well Patient left: in chair;with call bell/phone within reach     Time: 1250-1315 PT Time Calculation (min) (ACUTE ONLY): 25 min  Charges:  $Gait Training: 8-22 mins $Therapeutic Activity: 8-22 mins                    G Codes:      Rica Koyanagi  PTA  [facility]  Acute  Rehab Pager      [phone]

## 2015-04-28 DIAGNOSIS — M1712 Unilateral primary osteoarthritis, left knee: Secondary | ICD-10-CM | POA: Diagnosis not present

## 2015-04-28 DIAGNOSIS — Z471 Aftercare following joint replacement surgery: Secondary | ICD-10-CM | POA: Diagnosis not present

## 2015-04-28 DIAGNOSIS — I2510 Atherosclerotic heart disease of native coronary artery without angina pectoris: Secondary | ICD-10-CM | POA: Diagnosis not present

## 2015-04-28 DIAGNOSIS — J449 Chronic obstructive pulmonary disease, unspecified: Secondary | ICD-10-CM | POA: Diagnosis not present

## 2015-04-28 DIAGNOSIS — I129 Hypertensive chronic kidney disease with stage 1 through stage 4 chronic kidney disease, or unspecified chronic kidney disease: Secondary | ICD-10-CM | POA: Diagnosis not present

## 2015-04-28 DIAGNOSIS — G629 Polyneuropathy, unspecified: Secondary | ICD-10-CM | POA: Diagnosis not present

## 2015-04-30 DIAGNOSIS — G629 Polyneuropathy, unspecified: Secondary | ICD-10-CM | POA: Diagnosis not present

## 2015-04-30 DIAGNOSIS — I2510 Atherosclerotic heart disease of native coronary artery without angina pectoris: Secondary | ICD-10-CM | POA: Diagnosis not present

## 2015-04-30 DIAGNOSIS — M1712 Unilateral primary osteoarthritis, left knee: Secondary | ICD-10-CM | POA: Diagnosis not present

## 2015-04-30 DIAGNOSIS — J449 Chronic obstructive pulmonary disease, unspecified: Secondary | ICD-10-CM | POA: Diagnosis not present

## 2015-04-30 DIAGNOSIS — Z471 Aftercare following joint replacement surgery: Secondary | ICD-10-CM | POA: Diagnosis not present

## 2015-04-30 DIAGNOSIS — I129 Hypertensive chronic kidney disease with stage 1 through stage 4 chronic kidney disease, or unspecified chronic kidney disease: Secondary | ICD-10-CM | POA: Diagnosis not present

## 2015-05-03 DIAGNOSIS — Z471 Aftercare following joint replacement surgery: Secondary | ICD-10-CM | POA: Diagnosis not present

## 2015-05-03 DIAGNOSIS — G629 Polyneuropathy, unspecified: Secondary | ICD-10-CM | POA: Diagnosis not present

## 2015-05-03 DIAGNOSIS — J449 Chronic obstructive pulmonary disease, unspecified: Secondary | ICD-10-CM | POA: Diagnosis not present

## 2015-05-03 DIAGNOSIS — M1712 Unilateral primary osteoarthritis, left knee: Secondary | ICD-10-CM | POA: Diagnosis not present

## 2015-05-03 DIAGNOSIS — I129 Hypertensive chronic kidney disease with stage 1 through stage 4 chronic kidney disease, or unspecified chronic kidney disease: Secondary | ICD-10-CM | POA: Diagnosis not present

## 2015-05-03 DIAGNOSIS — I2510 Atherosclerotic heart disease of native coronary artery without angina pectoris: Secondary | ICD-10-CM | POA: Diagnosis not present

## 2015-05-05 DIAGNOSIS — Z471 Aftercare following joint replacement surgery: Secondary | ICD-10-CM | POA: Diagnosis not present

## 2015-05-05 DIAGNOSIS — J449 Chronic obstructive pulmonary disease, unspecified: Secondary | ICD-10-CM | POA: Diagnosis not present

## 2015-05-05 DIAGNOSIS — M1712 Unilateral primary osteoarthritis, left knee: Secondary | ICD-10-CM | POA: Diagnosis not present

## 2015-05-05 DIAGNOSIS — G629 Polyneuropathy, unspecified: Secondary | ICD-10-CM | POA: Diagnosis not present

## 2015-05-05 DIAGNOSIS — I129 Hypertensive chronic kidney disease with stage 1 through stage 4 chronic kidney disease, or unspecified chronic kidney disease: Secondary | ICD-10-CM | POA: Diagnosis not present

## 2015-05-05 DIAGNOSIS — I2510 Atherosclerotic heart disease of native coronary artery without angina pectoris: Secondary | ICD-10-CM | POA: Diagnosis not present

## 2015-05-06 ENCOUNTER — Other Ambulatory Visit: Payer: Self-pay | Admitting: Family Medicine

## 2015-05-07 DIAGNOSIS — M1712 Unilateral primary osteoarthritis, left knee: Secondary | ICD-10-CM | POA: Diagnosis not present

## 2015-05-07 DIAGNOSIS — G629 Polyneuropathy, unspecified: Secondary | ICD-10-CM | POA: Diagnosis not present

## 2015-05-07 DIAGNOSIS — I2510 Atherosclerotic heart disease of native coronary artery without angina pectoris: Secondary | ICD-10-CM | POA: Diagnosis not present

## 2015-05-07 DIAGNOSIS — I129 Hypertensive chronic kidney disease with stage 1 through stage 4 chronic kidney disease, or unspecified chronic kidney disease: Secondary | ICD-10-CM | POA: Diagnosis not present

## 2015-05-07 DIAGNOSIS — J449 Chronic obstructive pulmonary disease, unspecified: Secondary | ICD-10-CM | POA: Diagnosis not present

## 2015-05-07 DIAGNOSIS — Z471 Aftercare following joint replacement surgery: Secondary | ICD-10-CM | POA: Diagnosis not present

## 2015-05-10 DIAGNOSIS — M1712 Unilateral primary osteoarthritis, left knee: Secondary | ICD-10-CM | POA: Diagnosis not present

## 2015-05-10 DIAGNOSIS — I129 Hypertensive chronic kidney disease with stage 1 through stage 4 chronic kidney disease, or unspecified chronic kidney disease: Secondary | ICD-10-CM | POA: Diagnosis not present

## 2015-05-10 DIAGNOSIS — J449 Chronic obstructive pulmonary disease, unspecified: Secondary | ICD-10-CM | POA: Diagnosis not present

## 2015-05-10 DIAGNOSIS — I2510 Atherosclerotic heart disease of native coronary artery without angina pectoris: Secondary | ICD-10-CM | POA: Diagnosis not present

## 2015-05-10 DIAGNOSIS — Z471 Aftercare following joint replacement surgery: Secondary | ICD-10-CM | POA: Diagnosis not present

## 2015-05-10 DIAGNOSIS — G629 Polyneuropathy, unspecified: Secondary | ICD-10-CM | POA: Diagnosis not present

## 2015-05-10 DIAGNOSIS — Z96651 Presence of right artificial knee joint: Secondary | ICD-10-CM | POA: Diagnosis not present

## 2015-05-11 DIAGNOSIS — M1712 Unilateral primary osteoarthritis, left knee: Secondary | ICD-10-CM | POA: Diagnosis not present

## 2015-05-11 DIAGNOSIS — J449 Chronic obstructive pulmonary disease, unspecified: Secondary | ICD-10-CM | POA: Diagnosis not present

## 2015-05-11 DIAGNOSIS — I2510 Atherosclerotic heart disease of native coronary artery without angina pectoris: Secondary | ICD-10-CM | POA: Diagnosis not present

## 2015-05-11 DIAGNOSIS — Z471 Aftercare following joint replacement surgery: Secondary | ICD-10-CM | POA: Diagnosis not present

## 2015-05-11 DIAGNOSIS — I129 Hypertensive chronic kidney disease with stage 1 through stage 4 chronic kidney disease, or unspecified chronic kidney disease: Secondary | ICD-10-CM | POA: Diagnosis not present

## 2015-05-11 DIAGNOSIS — G629 Polyneuropathy, unspecified: Secondary | ICD-10-CM | POA: Diagnosis not present

## 2015-05-13 ENCOUNTER — Other Ambulatory Visit: Payer: Self-pay | Admitting: Pediatrics

## 2015-05-14 DIAGNOSIS — G629 Polyneuropathy, unspecified: Secondary | ICD-10-CM | POA: Diagnosis not present

## 2015-05-14 DIAGNOSIS — I2510 Atherosclerotic heart disease of native coronary artery without angina pectoris: Secondary | ICD-10-CM | POA: Diagnosis not present

## 2015-05-14 DIAGNOSIS — M1712 Unilateral primary osteoarthritis, left knee: Secondary | ICD-10-CM | POA: Diagnosis not present

## 2015-05-14 DIAGNOSIS — J449 Chronic obstructive pulmonary disease, unspecified: Secondary | ICD-10-CM | POA: Diagnosis not present

## 2015-05-14 DIAGNOSIS — I129 Hypertensive chronic kidney disease with stage 1 through stage 4 chronic kidney disease, or unspecified chronic kidney disease: Secondary | ICD-10-CM | POA: Diagnosis not present

## 2015-05-14 DIAGNOSIS — Z471 Aftercare following joint replacement surgery: Secondary | ICD-10-CM | POA: Diagnosis not present

## 2015-05-17 DIAGNOSIS — M1712 Unilateral primary osteoarthritis, left knee: Secondary | ICD-10-CM | POA: Diagnosis not present

## 2015-05-17 DIAGNOSIS — Z471 Aftercare following joint replacement surgery: Secondary | ICD-10-CM | POA: Diagnosis not present

## 2015-05-17 DIAGNOSIS — J449 Chronic obstructive pulmonary disease, unspecified: Secondary | ICD-10-CM | POA: Diagnosis not present

## 2015-05-17 DIAGNOSIS — I2510 Atherosclerotic heart disease of native coronary artery without angina pectoris: Secondary | ICD-10-CM | POA: Diagnosis not present

## 2015-05-17 DIAGNOSIS — G629 Polyneuropathy, unspecified: Secondary | ICD-10-CM | POA: Diagnosis not present

## 2015-05-17 DIAGNOSIS — I129 Hypertensive chronic kidney disease with stage 1 through stage 4 chronic kidney disease, or unspecified chronic kidney disease: Secondary | ICD-10-CM | POA: Diagnosis not present

## 2015-05-19 ENCOUNTER — Encounter (HOSPITAL_COMMUNITY): Payer: Self-pay

## 2015-05-19 DIAGNOSIS — G629 Polyneuropathy, unspecified: Secondary | ICD-10-CM | POA: Diagnosis not present

## 2015-05-19 DIAGNOSIS — I2510 Atherosclerotic heart disease of native coronary artery without angina pectoris: Secondary | ICD-10-CM | POA: Insufficient documentation

## 2015-05-19 DIAGNOSIS — R109 Unspecified abdominal pain: Secondary | ICD-10-CM | POA: Insufficient documentation

## 2015-05-19 DIAGNOSIS — M109 Gout, unspecified: Secondary | ICD-10-CM | POA: Insufficient documentation

## 2015-05-19 DIAGNOSIS — R350 Frequency of micturition: Secondary | ICD-10-CM | POA: Insufficient documentation

## 2015-05-19 DIAGNOSIS — Z8546 Personal history of malignant neoplasm of prostate: Secondary | ICD-10-CM | POA: Insufficient documentation

## 2015-05-19 DIAGNOSIS — Z79899 Other long term (current) drug therapy: Secondary | ICD-10-CM | POA: Insufficient documentation

## 2015-05-19 DIAGNOSIS — M1712 Unilateral primary osteoarthritis, left knee: Secondary | ICD-10-CM | POA: Diagnosis not present

## 2015-05-19 DIAGNOSIS — Z86018 Personal history of other benign neoplasm: Secondary | ICD-10-CM | POA: Diagnosis not present

## 2015-05-19 DIAGNOSIS — Z7901 Long term (current) use of anticoagulants: Secondary | ICD-10-CM | POA: Diagnosis not present

## 2015-05-19 DIAGNOSIS — R110 Nausea: Secondary | ICD-10-CM | POA: Diagnosis not present

## 2015-05-19 DIAGNOSIS — F17210 Nicotine dependence, cigarettes, uncomplicated: Secondary | ICD-10-CM | POA: Diagnosis not present

## 2015-05-19 DIAGNOSIS — I129 Hypertensive chronic kidney disease with stage 1 through stage 4 chronic kidney disease, or unspecified chronic kidney disease: Secondary | ICD-10-CM | POA: Diagnosis not present

## 2015-05-19 DIAGNOSIS — Z87442 Personal history of urinary calculi: Secondary | ICD-10-CM | POA: Insufficient documentation

## 2015-05-19 DIAGNOSIS — M1990 Unspecified osteoarthritis, unspecified site: Secondary | ICD-10-CM | POA: Diagnosis not present

## 2015-05-19 DIAGNOSIS — J449 Chronic obstructive pulmonary disease, unspecified: Secondary | ICD-10-CM | POA: Diagnosis not present

## 2015-05-19 DIAGNOSIS — K219 Gastro-esophageal reflux disease without esophagitis: Secondary | ICD-10-CM | POA: Diagnosis not present

## 2015-05-19 DIAGNOSIS — I10 Essential (primary) hypertension: Secondary | ICD-10-CM | POA: Diagnosis not present

## 2015-05-19 DIAGNOSIS — N200 Calculus of kidney: Secondary | ICD-10-CM | POA: Diagnosis not present

## 2015-05-19 DIAGNOSIS — R3915 Urgency of urination: Secondary | ICD-10-CM | POA: Insufficient documentation

## 2015-05-19 DIAGNOSIS — Z471 Aftercare following joint replacement surgery: Secondary | ICD-10-CM | POA: Diagnosis not present

## 2015-05-19 NOTE — ED Notes (Signed)
Pt reports history of kidney stones, states he has been having right flank pain x 3 days, nausea, no vomiting.

## 2015-05-20 ENCOUNTER — Emergency Department (HOSPITAL_COMMUNITY): Payer: Medicare Other

## 2015-05-20 ENCOUNTER — Emergency Department (HOSPITAL_COMMUNITY)
Admission: EM | Admit: 2015-05-20 | Discharge: 2015-05-20 | Disposition: A | Discharge status: Home / Self Care | Payer: Medicare Other | Attending: Emergency Medicine | Admitting: Emergency Medicine

## 2015-05-20 DIAGNOSIS — R109 Unspecified abdominal pain: Secondary | ICD-10-CM

## 2015-05-20 DIAGNOSIS — N200 Calculus of kidney: Secondary | ICD-10-CM | POA: Diagnosis not present

## 2015-05-20 LAB — URINE MICROSCOPIC-ADD ON

## 2015-05-20 LAB — URINALYSIS, ROUTINE W REFLEX MICROSCOPIC
Bilirubin Urine: NEGATIVE
Glucose, UA: NEGATIVE mg/dL
Hgb urine dipstick: NEGATIVE
Ketones, ur: NEGATIVE mg/dL
Leukocytes, UA: NEGATIVE
Nitrite: NEGATIVE
Protein, ur: 30 mg/dL — AB
Specific Gravity, Urine: 1.02 (ref 1.005–1.030)
pH: 6 (ref 5.0–8.0)

## 2015-05-20 MED ORDER — CYCLOBENZAPRINE HCL 5 MG PO TABS: 5.0000 mg | ORAL_TABLET | Freq: Three times a day (TID) | ORAL | Status: DC | PRN

## 2015-05-20 MED ORDER — CEPHALEXIN 500 MG PO CAPS
500.0000 mg | ORAL_CAPSULE | Freq: Once | ORAL | Status: AC
  Administered 2015-05-20: 500 mg via ORAL
  Filled 2015-05-20: qty 1

## 2015-05-20 MED ORDER — SODIUM CHLORIDE 0.9 % IV SOLN
INTRAVENOUS | Status: DC
  Administered 2015-05-20: 01:00:00 via INTRAVENOUS

## 2015-05-20 MED ORDER — CEPHALEXIN 500 MG PO CAPS: 500.0000 mg | ORAL_CAPSULE | Freq: Three times a day (TID) | ORAL | Status: DC

## 2015-05-20 MED ORDER — HYDROMORPHONE HCL 1 MG/ML IJ SOLN
0.5000 mg | Freq: Once | INTRAMUSCULAR | Status: AC
  Administered 2015-05-20: 0.5 mg via INTRAVENOUS
  Filled 2015-05-20: qty 1

## 2015-05-20 MED ORDER — ONDANSETRON HCL 4 MG/2ML IJ SOLN
4.0000 mg | Freq: Once | INTRAMUSCULAR | Status: AC
  Administered 2015-05-20: 4 mg via INTRAVENOUS
  Filled 2015-05-20: qty 2

## 2015-05-20 NOTE — Discharge Instructions (Signed)
Use ice and heat to the area for comfort. You can use a lidoderm patch OTC for the pain. Take the flexeril for your muscle pain. Follow up with your doctor if you need something stronger for pain. Take the antibiotic until gone. Return to the ED if you get a fever, have uncontrolled vomiting.    Flank Pain Flank pain is pain in your side. The flank is the area of your side between your upper belly (abdomen) and your back. Pain in this area can be caused by many different things. Pala care and treatment will depend on the cause of your pain.  Rest as told by your doctor.  Drink enough fluids to keep your pee (urine) clear or pale yellow.  Only take medicine as told by your doctor.  Tell your doctor about any changes in your pain.  Follow up with your doctor. GET HELP RIGHT AWAY IF:   Your pain does not get better with medicine.   You have new symptoms or your symptoms get worse.  Your pain gets worse.   You have belly (abdominal) pain.   You are short of breath.   You always feel sick to your stomach (nauseous).   You keep throwing up (vomiting).   You have puffiness (swelling) in your belly.   You feel light-headed or you pass out (faint).   You have blood in your pee.  You have a fever or lasting symptoms for more than 2-3 days.  You have a fever and your symptoms suddenly get worse. MAKE SURE YOU:   Understand these instructions.  Will watch your condition.  Will get help right away if you are not doing well or get worse.   This information is not intended to replace advice given to you by your health care provider. Make sure you discuss any questions you have with your health care provider.   Document Released: 12/28/2007 Document Revised: 04/10/2014 Document Reviewed: 11/02/2011 Elsevier Interactive Patient Education [employer].

## 2015-05-20 NOTE — ED Provider Notes (Signed)
CSN: [id number]     Arrival date & time 05/19/15  2319 History   By signing my name below, I, Rohini Rajnarayanan, attest that this documentation has been prepared under the direction and in the presence of Rolland Porter, MD at [facility] Electronically Signed: Evonnie Dawes, ED Scribe 05/20/2015 at 1:11 AM.   Chief Complaint  Patient presents with  . Flank Pain   The history is provided by the patient. No language interpreter was used.   HPI Comments: MIR AMBER is a 70 y.o. male with a pmhx of kidney stones, and a family hx of kidney cancer in his father and brother, who presents to the Emergency Department complaining of constant, persistent, ongoing, right flank pain x3 days which worsened today. Pt admits that current sx feel similar to past kidney stones. Pt admits to increased urgency and frequency. Pain is not exacerbated by palpitation and does not radiate to the abdomen. He admits to having constant chills, as if he "cannot get warm." He states that nothing makes the pain worse and the tramadol makes his pain feel better. He also reports some frequency and decreased urinary volume over the last couple days. He is currently taking traMADol s/p surgery on his right knee on Jan 23rd. Pt denies any nausea or vomiting, hematuria, fever, cough, SOB. He does feel lightheaded at times. Pt is a current every day smoker - 3/4 PPD.   PCP Dr Evette Doffing [facility] Dr Wynelle Link Urology Dr. [name]  Past Medical History  Diagnosis Date  . Coronary atherosclerosis of native coronary artery     Mild atherosclerosis at catheterization 2011  . Gout   . GERD (gastroesophageal reflux disease)   . Essential hypertension, benign   . COPD (chronic obstructive pulmonary disease) ([place])   . Tubular adenoma of colon 08/2008  . Prostate cancer ([place])     radiation tx  . Aortic atherosclerosis ([facility])     Penetrating aortic ulcer s/p endograft 2004 - followed by Dr. Trula Slade  . Shortness of breath dyspnea     with  exertion  . Arthritis   . History of kidney stones   . Sleep apnea     Intolerant of CPAP  . Neuropathy ([place])     TOES   Past Surgical History  Procedure Laterality Date  . Aortic endograft repair  2004    Dr. [name]  . Colonoscopy w/ polypectomy    . Umbilical hernia repair    . Fracture surgery      Bilateral lower arms as a child  . Knee arthroscopy      Left  . Prostate biopsy  09/26/2011    Procedure: BIOPSY TRANSRECTAL ULTRASONIC PROSTATE (TUBP);  Surgeon: [name], MD;  Location: [facility];  Service: Urology;  Laterality: N/A;     . Circumcision  09/26/2011    Procedure: CIRCUMCISION ADULT;  Surgeon: [name], MD;  Location: [facility];  Service: Urology;  Laterality: N/A;  . Cystoscopy  09/26/2011    Procedure: CYSTOSCOPY;  Surgeon: [name], MD;  Location: [facility];  Service: Urology;  Laterality: N/A;  . Knee surgery  Sept. 26, 2013    Torn minisc.- Right  knee  . Evar    . Endovascular stent insertion  September 13, 2007    EVAR - Aortic stent graft  . Chest tube insertion  1989    "collapsed lung"due to injury  . Partial knee arthroplasty Right 04/26/2015    Procedure: RIGHT KNEE MEDIAL UNICOMPARTMENTAL ARTHROPLASTY;  Surgeon: Gaynelle Arabian, MD;  Location: [facility];  Service: Orthopedics;  Laterality: Right;   Family History  Problem Relation Age of Onset  . Stroke Mother   . Mesothelioma Father   . Cancer Father   . Hyperlipidemia Sister   . Heart attack Sister   . Cancer Brother 7  . Heart disease Brother   . Hyperlipidemia Brother    Social History  Substance Use Topics  . Smoking status: Current Every Day Smoker -- 0.50 packs/day for 42 years    Types: Cigarettes  . Smokeless tobacco: Never Used     Comment: 12 ciggs per day  . Alcohol Use: No  smokes 3/4 ppd Retired [employer]'s [employer] Lives at home   Review of Systems  Constitutional: Negative for fever.  Respiratory: Negative for cough and shortness of breath.   Gastrointestinal: Positive for nausea.  Negative for vomiting.  Genitourinary: Positive for urgency, frequency and flank pain. Negative for dysuria, hematuria and difficulty urinating.  All other systems reviewed and are negative.     Allergies  Carbidopa-levodopa; Morphine sulfate; Sulfacetamide sodium; and Sulfamethoxazole-trimethoprim  Home Medications   Prior to Admission medications   Medication Sig Start Date End Date Taking? Authorizing Provider  allopurinol (ZYLOPRIM) 300 MG tablet TAKE ONE TABLET BY MOUTH ONCE DAILY 05/14/15   Eustaquio Maize, MD  amLODipine (NORVASC) 10 MG tablet Take 1 tablet (10 mg total) by mouth daily. 03/25/15   Eustaquio Maize, MD  bicalutamide (CASODEX) 50 MG tablet Take 50 mg by mouth daily.  06/06/13   Historical Provider, MD  cloNIDine (CATAPRES) 0.1 MG tablet Take 2 tablets (0.2 mg total) by mouth 2 (two) times daily. 06/23/14   Wardell Honour, MD  gabapentin (NEURONTIN) 300 MG capsule Take 300 mg by mouth 3 (three) times daily.  03/17/15   Historical Provider, MD  ketoconazole (NIZORAL) 2 % cream Apply 1 application topically daily. 03/17/15   Eustaquio Maize, MD  losartan (COZAAR) 50 MG tablet Take 1 tablet (50 mg total) by mouth daily. 02/26/15   Wardell Honour, MD  methocarbamol (ROBAXIN) 500 MG tablet Take 1 tablet (500 mg total) by mouth every 6 (six) hours as needed for muscle spasms. 04/27/15   Arlee Muslim, PA-C  oxybutynin (DITROPAN) 5 MG tablet Take 5 mg by mouth daily.  04/22/13   Historical Provider, MD  oxyCODONE (OXY IR/ROXICODONE) 5 MG immediate release tablet Take 1-2 tablets (5-10 mg total) by mouth every 3 (three) hours as needed for moderate pain, severe pain or breakthrough pain. 04/27/15   Arlee Muslim, PA-C  pantoprazole (PROTONIX) 40 MG tablet TAKE ONE TABLET BY MOUTH TWICE DAILY 03/08/15   Eustaquio Maize, MD  rivaroxaban (XARELTO) 10 MG TABS tablet Take 1 tablet (10 mg total) by mouth daily with breakfast. Xarelto 10 mg daily for ten days, then change to Aspirin 325 mg  daily for two weeks, then reduce to Baby Aspirin 81 mg daily for three additional weeks. 04/27/15   Arlee Muslim, PA-C  spironolactone (ALDACTONE) 25 MG tablet TAKE ONE-HALF TABLET BY MOUTH ONCE DAILY 05/07/15   Wardell Honour, MD  traMADol (ULTRAM) 50 MG tablet Take 1-2 tablets (50-100 mg total) by mouth every 6 (six) hours as needed (mild pain). 04/27/15   Arlee Muslim, PA-C   BP 143/87 mmHg  Pulse 80  Temp(Src) 97.8 F (36.6 C) (Oral)  Resp 20  Ht 5' 7.75" (1.721 m)  Wt 270 lb (122.471 kg)  BMI 41.35 kg/m2  SpO2  97%  Vital signs normal   Physical Exam  Constitutional: He is oriented to person, place, and time. He appears well-developed and well-nourished.  Non-toxic appearance. He does not appear ill. No distress.  HENT:  Head: Normocephalic and atraumatic.  Right Ear: External ear normal.  Left Ear: External ear normal.  Nose: Nose normal. No mucosal edema or rhinorrhea.  Mouth/Throat: Oropharynx is clear and moist and mucous membranes are normal. No dental abscesses or uvula swelling.  Eyes: Conjunctivae and EOM are normal. Pupils are equal, round, and reactive to light.  Neck: Normal range of motion and full passive range of motion without pain. Neck supple.  Cardiovascular: Normal rate, regular rhythm and normal heart sounds.  Exam reveals no gallop and no friction rub.   No murmur heard. Pulmonary/Chest: Effort normal and breath sounds normal. No respiratory distress. He has no wheezes. He has no rhonchi. He has no rales. He exhibits no tenderness and no crepitus.  Abdominal: Soft. Normal appearance and bowel sounds are normal. He exhibits no distension. There is no tenderness. There is no rebound and no guarding.  Genitourinary:  Pt indicates that right flank is where his pain is located. But it is non-TTP.   Musculoskeletal: Normal range of motion. He exhibits no edema or tenderness.  Moves all extremities well.   Neurological: He is alert and oriented to person, place, and  time. He has normal strength. No cranial nerve deficit.  Skin: Skin is warm, dry and intact. No rash noted. No erythema. No pallor.  Psychiatric: He has a normal mood and affect. His speech is normal and behavior is normal. His mood appears not anxious.  Nursing note and vitals reviewed.   ED Course  Procedures   Medications  0.9 %  sodium chloride infusion ( Intravenous Stopped 05/20/15 0403)  cephALEXin (KEFLEX) capsule 500 mg (not administered)  ondansetron (ZOFRAN) injection 4 mg (4 mg Intravenous Given 05/20/15 0059)  HYDROmorphone (DILAUDID) injection 0.5 mg (0.5 mg Intravenous Given 05/20/15 0059)    DIAGNOSTIC STUDIES: Oxygen Saturation is 97% on RA, adequate by my interpretation.    COORDINATION OF CARE: 12:32 AM-Discussed treatment plan which includes urinalysis, and CT renal stone study, and giving Zofran and Dilaudid,  with pt at bedside and pt agreed to plan.   Recheck at 4 AM patient states his pain is improved. He feels ready to be discharged. We discussed his CT results. Because of the CT results he was started on antibiotics and a urine culture was sent despite his urine being basically pretty normal. He was started on Keflex orally.     Review of patient's labs shows on January 24th his BUN was 27 and his creatinine was 1.89 which was chronically elevated. Because of that he was not started on anti-inflammatory pain medication.  Review of the [facility] shows patient got #12 Tylenol 3 on September 7 by a dentist, he received #80 tramadol 50 mg tablets on January 24 and #90 oxycodone 5 mg tablets on January 24 from Dr. Dara Lords, [facility] orthopedics.  Labs Review Results for orders placed or performed during the hospital encounter of 05/20/15  Urinalysis, Routine w reflex microscopic  Result Value Ref Range   Color, Urine YELLOW YELLOW   APPearance CLEAR CLEAR   Specific Gravity, Urine 1.020 1.005 - 1.030   pH 6.0 5.0 - 8.0   Glucose, UA NEGATIVE  NEGATIVE mg/dL   Hgb urine dipstick NEGATIVE NEGATIVE   Bilirubin Urine NEGATIVE NEGATIVE   Ketones, ur  NEGATIVE NEGATIVE mg/dL   Protein, ur 30 (A) NEGATIVE mg/dL   Nitrite NEGATIVE NEGATIVE   Leukocytes, UA NEGATIVE NEGATIVE  Urine microscopic-add on  Result Value Ref Range   Squamous Epithelial / LPF 0-5 (A) NONE SEEN   WBC, UA 0-5 0 - 5 WBC/hpf   RBC / HPF 0-5 0 - 5 RBC/hpf   Bacteria, UA FEW (A) NONE SEEN   Laboratory interpretation all normal except pretty normal UA although urine culture was sent  to the lab b/o CT report    Imaging Review Ct Renal Stone Study  05/20/2015  CLINICAL DATA:  70 year old male with right flank pain. EXAM: CT ABDOMEN AND PELVIS WITHOUT CONTRAST TECHNIQUE: Multidetector CT imaging of the abdomen and pelvis was performed following the standard protocol without IV contrast. COMPARISON:  CT dated 06/11/2013 FINDINGS: Evaluation of this exam is limited in the absence of intravenous contrast. There is a focal area chronic appearing change with small cystic formation in the lingula. Minimal bibasilar atelectatic changes also noted. There is coronary vascular calcification. No intra-abdominal free air or free fluid. Stable appearing 1 cm hypodense lesion in the inferior aspect of the right lobe of the liver posteriorly, incompletely characterized, possibly a cyst or hemangioma. MRI may provide better evaluation. The gallbladder, pancreas, spleen appear unremarkable. There is mild bilateral adrenal nodularity. There is a 2 cm left adrenal fat containing nodule, likely an adenoma or myelo lipoma. There are two punctate nonobstructing right renal calculi. No hydronephrosis. There is moderate bilateral renal atrophy. Multiple bilateral renal hypodense lesions measuring up to 4.5 cm in the inferior pole of the left kidney are not characterized on this noncontrast CT but appear grossly stable compared to the prior study and likely represent cysts. Ultrasound may provide  better evaluation. There is no hydronephrosis or nephrolithiasis on the left. The visualized ureters appear unremarkable. There is apparent diffuse thickening of the bladder wall which may be partly related to underdistention. Cystitis is not excluded. Correlation with urinalysis recommended. Biopsy clips in the region of the prostate gland noted. There is moderate stool throughout the colon. Small scattered colonic diverticula noted. There is no evidence of bowel obstruction or inflammation. Normal appendix. An aorta bi-iliac endovascular stent graft repair is noted. Evaluation of the aorta is limited in the absence of intravenous contrast. There is no aneurysmal dilatation. No portal venous gas identified. There is no adenopathy. There is a small fat containing supraumbilical as well as a small fat containing umbilical hernia without inflammatory changes. The abdominal wall soft tissues are otherwise unremarkable. There is degenerative changes of the spine. No acute fracture. IMPRESSION: Punctate nonobstructing right renal calculi. No hydronephrosis. Mild apparent thickening of the urinary bladder. Correlation with urinalysis recommended to exclude UTI. No other acute intra-abdominal or pelvic pathology identified. Electronically Signed   By: Anner Crete M.D.   On: 05/20/2015 01:30   I have personally reviewed and evaluated these images and lab results as part of my medical decision-making.    MDM   Final diagnoses:  Right flank pain    Discharge Medication List as of 05/20/2015  4:17 AM    START taking these medications   Details  cephALEXin (KEFLEX) 500 MG capsule Take 1 capsule (500 mg total) by mouth 3 (three) times daily., Starting 05/20/2015, Until Discontinued, Print    cyclobenzaprine (FLEXERIL) 5 MG tablet Take 1 tablet (5 mg total) by mouth 3 (three) times daily as needed (for muscle pain)., Starting 05/20/2015, Until Discontinued, Print  Plan discharge  Rolland Porter, MD,  FACEP    I personally performed the services described in this documentation, which was scribed in my presence. The recorded information has been reviewed and considered.  Rolland Porter, MD, Barbette Or, MD 05/20/15 [phone]

## 2015-05-21 ENCOUNTER — Ambulatory Visit: Payer: Medicare Other | Admitting: Urology

## 2015-05-21 DIAGNOSIS — J449 Chronic obstructive pulmonary disease, unspecified: Secondary | ICD-10-CM | POA: Diagnosis not present

## 2015-05-21 DIAGNOSIS — Z471 Aftercare following joint replacement surgery: Secondary | ICD-10-CM | POA: Diagnosis not present

## 2015-05-21 DIAGNOSIS — M1712 Unilateral primary osteoarthritis, left knee: Secondary | ICD-10-CM | POA: Diagnosis not present

## 2015-05-21 DIAGNOSIS — I129 Hypertensive chronic kidney disease with stage 1 through stage 4 chronic kidney disease, or unspecified chronic kidney disease: Secondary | ICD-10-CM | POA: Diagnosis not present

## 2015-05-21 DIAGNOSIS — I2510 Atherosclerotic heart disease of native coronary artery without angina pectoris: Secondary | ICD-10-CM | POA: Diagnosis not present

## 2015-05-21 DIAGNOSIS — G629 Polyneuropathy, unspecified: Secondary | ICD-10-CM | POA: Diagnosis not present

## 2015-05-21 LAB — URINE CULTURE: Culture: 9000

## 2015-05-28 ENCOUNTER — Ambulatory Visit: Payer: Medicare Other | Admitting: Cardiology

## 2015-05-31 ENCOUNTER — Encounter: Payer: Self-pay | Admitting: Pediatrics

## 2015-05-31 ENCOUNTER — Ambulatory Visit (INDEPENDENT_AMBULATORY_CARE_PROVIDER_SITE_OTHER): Payer: Medicare Other | Admitting: Pediatrics

## 2015-05-31 VITALS — BP 132/90 | HR 69 | Temp 96.7°F | Ht 67.88 in | Wt 281.4 lb

## 2015-05-31 DIAGNOSIS — G629 Polyneuropathy, unspecified: Secondary | ICD-10-CM

## 2015-05-31 DIAGNOSIS — Z6841 Body Mass Index (BMI) 40.0 and over, adult: Secondary | ICD-10-CM | POA: Insufficient documentation

## 2015-05-31 DIAGNOSIS — I10 Essential (primary) hypertension: Secondary | ICD-10-CM

## 2015-05-31 DIAGNOSIS — M545 Low back pain, unspecified: Secondary | ICD-10-CM

## 2015-05-31 MED ORDER — GABAPENTIN 300 MG PO CAPS: 600.0000 mg | ORAL_CAPSULE | Freq: Three times a day (TID) | ORAL | Status: DC

## 2015-05-31 NOTE — Progress Notes (Signed)
Subjective:    Patient ID: [name], male    DOB: 09/22/1945, 70 y.o.   MRN: [id number]  CC: Back Pain   HPI: [name] is a 70 y.o. male presenting for Back Pain  Recent knee surgery Still having some pain Improving Finished PT last week Past two weeks has had back pain when he leans forward, such as "to peel potatos"  Back pain improves as soon as he stands up No back pain with twisting, lateral bending Spending a lot of time in recliner recently since surgery Does fine moving around house now Using cane in L hand, R sided knee surgery Has had similar back pain in the past that improved with a back brace Tramadol that he has for his knee replacement helps some with pain Muscle relaxants dont help at all No fevers, dysuria Pain is always related to certain movements Seen in ED recently, had CT renal scan done, two un-obstructing stones found, no urine infection Wants to lose weight, not able to do much exercise now with knee but hopeful that will continue to improve   Depression screen [facility] 2/9 05/31/2015 03/17/2015 12/30/2014 09/23/2014 08/08/2013  Decreased Interest 0 0 0 1 0  Down, Depressed, Hopeless 0 0 0 1 0  PHQ - 2 Score 0 0 0 2 0     Relevant past medical, surgical, family and social history reviewed and updated as indicated. Interim medical history since our last visit reviewed. Allergies and medications reviewed and updated.    ROS: Per HPI unless specifically indicated above  History  Smoking status  . Current Every Day Smoker -- 0.50 packs/day for 42 years  . Types: Cigarettes  Smokeless tobacco  . Never Used    Comment: 12 ciggs per day    Past Medical History Patient Active Problem List   Diagnosis Date Noted  . BMI 40.0-44.9, adult ([place]) 05/31/2015  . OA (osteoarthritis) of knee 04/26/2015  . Neuropathy ([place]) 03/17/2015  . Cramp of both lower extremities 02/23/2014  . Prostate cancer ([place]) 06/30/2013  . Abdominal aneurysm  without mention of rupture 02/19/2013  . Aftercare following surgery of the circulatory system, [place] 02/19/2013  . Need for prophylactic vaccination and inoculation against influenza 02/03/2013  . Cough 01/14/2013  . ANXIETY 01/14/2013  . OBESITY NOS 01/14/2013  . Kidney stone 01/14/2013  . VITAMIN B12 DEFICIENCY 01/14/2013  . TOBACCO ABUSE 01/14/2013  . PTSD 01/14/2013  . COPD (chronic obstructive pulmonary disease) ([place])   . OSA (obstructive sleep apnea) 08/14/2012  . RLS (restless legs syndrome) 08/14/2012  . Arthritis of both knees 08/14/2012  . Prediabetes 08/14/2012  . Nephrolithiasis 08/14/2012  . Back pain 07/29/2012  . Aortic atherosclerosis ([place]) 02/19/2012  . Coronary atherosclerosis of native coronary artery 03/23/2010  . CHRONIC KIDNEY DISEASE STAGE II ([facility]) 10/13/2009  . GERD 07/30/2008  . BENIGN PROSTATIC HYPERTROPHY, WITH OBSTRUCTION 02/20/2008  . Hyperlipidemia 01/31/2007  . GOUT 01/31/2007  . Essential hypertension, benign 01/31/2007        Objective:    BP 132/90 mmHg  Pulse 69  Temp(Src) 96.7 F (35.9 C) (Oral)  Ht 5' 7.88" (1.724 m)  Wt 281 lb 6.4 oz (127.642 kg)  BMI 42.95 kg/m2  Wt Readings from Last 3 Encounters:  05/31/15 281 lb 6.4 oz (127.642 kg)  05/19/15 270 lb (122.471 kg)  04/26/15 279 lb (126.554 kg)     Gen: NAD, alert, cooperative with exam, NCAT EYES: EOMI, no scleral injection or icterus ENT:  OP without erythema CV: WWP Resp:  normal WOB Abd: +BS, soft, NTND.  Ext: No pitting edema, warm Neuro: Alert and oriented MSK: No point tenderness over spine, or with palpation of paraspinal muscles, no pain with twisting or lateral bending. No pain with slight forward bend.     Assessment & Plan:    [name] was seen today for back pain, and other med problem f/u, no red flag symptoms. Gave gentle back exercises to do, Rx for a back brace as that has helped him with similar back pain in the past. REcently had urine/ct scan done  showing no obstructing stones or infection.  Diagnoses and all orders for this visit:  Neuropathy ([place]) -     gabapentin (NEURONTIN) 300 MG capsule; Take 2 capsules (600 mg total) by mouth 3 (three) times daily.  Essential hypertension, benign Adequate control, cont current meds  Right-sided low back pain without sciatica See above  BMI 40.0-44.9, adult ([place]) Discussed minimizing snacks, smaller portion sizes, cont lifestyle changes   Follow up plan: Return in about 4 months (around 09/28/2015).  Assunta Found, MD [facility] 05/31/2015, 10:21 AM

## 2015-05-31 NOTE — Patient Instructions (Signed)

## 2015-06-01 DIAGNOSIS — Z471 Aftercare following joint replacement surgery: Secondary | ICD-10-CM | POA: Diagnosis not present

## 2015-06-01 DIAGNOSIS — Z96651 Presence of right artificial knee joint: Secondary | ICD-10-CM | POA: Diagnosis not present

## 2015-06-07 ENCOUNTER — Telehealth: Payer: Self-pay | Admitting: Pediatrics

## 2015-06-07 MED ORDER — PANTOPRAZOLE SODIUM 40 MG PO TBEC: 40.0000 mg | DELAYED_RELEASE_TABLET | Freq: Two times a day (BID) | ORAL | Status: DC

## 2015-06-07 NOTE — Telephone Encounter (Signed)
done

## 2015-06-10 ENCOUNTER — Other Ambulatory Visit: Payer: Self-pay | Admitting: Family Medicine

## 2015-06-11 DIAGNOSIS — C61 Malignant neoplasm of prostate: Secondary | ICD-10-CM | POA: Diagnosis not present

## 2015-06-15 DIAGNOSIS — M79671 Pain in right foot: Secondary | ICD-10-CM | POA: Diagnosis not present

## 2015-06-15 DIAGNOSIS — G600 Hereditary motor and sensory neuropathy: Secondary | ICD-10-CM | POA: Diagnosis not present

## 2015-06-15 DIAGNOSIS — M79672 Pain in left foot: Secondary | ICD-10-CM | POA: Diagnosis not present

## 2015-06-15 DIAGNOSIS — G5790 Unspecified mononeuropathy of unspecified lower limb: Secondary | ICD-10-CM | POA: Diagnosis not present

## 2015-06-18 ENCOUNTER — Ambulatory Visit (INDEPENDENT_AMBULATORY_CARE_PROVIDER_SITE_OTHER): Payer: Medicare Other | Admitting: Urology

## 2015-06-18 DIAGNOSIS — N3941 Urge incontinence: Secondary | ICD-10-CM | POA: Diagnosis not present

## 2015-06-18 DIAGNOSIS — R350 Frequency of micturition: Secondary | ICD-10-CM | POA: Diagnosis not present

## 2015-06-18 DIAGNOSIS — R351 Nocturia: Secondary | ICD-10-CM

## 2015-06-18 DIAGNOSIS — C61 Malignant neoplasm of prostate: Secondary | ICD-10-CM

## 2015-06-18 DIAGNOSIS — B356 Tinea cruris: Secondary | ICD-10-CM

## 2015-06-29 DIAGNOSIS — G600 Hereditary motor and sensory neuropathy: Secondary | ICD-10-CM | POA: Diagnosis not present

## 2015-06-29 DIAGNOSIS — M79672 Pain in left foot: Secondary | ICD-10-CM | POA: Diagnosis not present

## 2015-06-29 DIAGNOSIS — G5790 Unspecified mononeuropathy of unspecified lower limb: Secondary | ICD-10-CM | POA: Diagnosis not present

## 2015-06-29 DIAGNOSIS — M79671 Pain in right foot: Secondary | ICD-10-CM | POA: Diagnosis not present

## 2015-07-05 ENCOUNTER — Ambulatory Visit (INDEPENDENT_AMBULATORY_CARE_PROVIDER_SITE_OTHER): Payer: Medicare Other | Admitting: Family Medicine

## 2015-07-05 ENCOUNTER — Ambulatory Visit (INDEPENDENT_AMBULATORY_CARE_PROVIDER_SITE_OTHER): Payer: Medicare Other

## 2015-07-05 ENCOUNTER — Encounter: Payer: Self-pay | Admitting: *Deleted

## 2015-07-05 ENCOUNTER — Encounter (INDEPENDENT_AMBULATORY_CARE_PROVIDER_SITE_OTHER): Payer: Self-pay

## 2015-07-05 ENCOUNTER — Encounter: Payer: Self-pay | Admitting: Family Medicine

## 2015-07-05 VITALS — BP 133/86 | HR 81 | Temp 97.6°F | Ht 68.0 in | Wt 280.6 lb

## 2015-07-05 DIAGNOSIS — M48061 Spinal stenosis, lumbar region without neurogenic claudication: Secondary | ICD-10-CM

## 2015-07-05 DIAGNOSIS — J201 Acute bronchitis due to Hemophilus influenzae: Secondary | ICD-10-CM | POA: Diagnosis not present

## 2015-07-05 DIAGNOSIS — M4806 Spinal stenosis, lumbar region: Secondary | ICD-10-CM | POA: Diagnosis not present

## 2015-07-05 MED ORDER — PREDNISONE 10 MG PO TABS
ORAL_TABLET | ORAL | Status: DC
Start: 2015-07-05 — End: 2015-07-19

## 2015-07-05 MED ORDER — AMOXICILLIN-POT CLAVULANATE 875-125 MG PO TABS: 1.0000 | ORAL_TABLET | Freq: Two times a day (BID) | ORAL | Status: DC

## 2015-07-05 MED ORDER — HYDROCODONE-HOMATROPINE 5-1.5 MG/5ML PO SYRP: 5.0000 mL | ORAL_SOLUTION | Freq: Four times a day (QID) | ORAL | Status: DC | PRN

## 2015-07-05 NOTE — Progress Notes (Signed)
Subjective:  Patient ID: [name], male    DOB: [birth year]  Age: 70 y.o. MRN: [id number]  CC: Back Pain and Cough   HPI [name] presents for 6-7/10 now bilateral lumbar painIntemittent. Worse with cough, turning over in bed. Okay seated. No relief with cyclobenzaprine. Has plans to see Dr. Les Pou next week to discuss.   Patient presents with upper respiratory congestion. Rhinorrhea that is frequently purulent. There is moderate sore throat. Patient reports coughing frequently as well.-colored/purulent sputum noted. There is no fever no chills no sweats. The patient denies being short of breath. Onset was 2 weeks ago. Gradually worsening in spite of home remedies.    History [name] has a past medical history of Coronary atherosclerosis of native coronary artery; Gout; GERD (gastroesophageal reflux disease); Essential hypertension, benign; COPD (chronic obstructive pulmonary disease) ([place]); Tubular adenoma of colon (08/2008); Prostate cancer ([place]); Aortic atherosclerosis ([place]); Shortness of breath dyspnea; Arthritis; History of kidney stones; Sleep apnea; and Neuropathy ([place]).   He has past surgical history that includes Aortic endograft repair (2004); Colonoscopy w/ polypectomy; Umbilical hernia repair; Fracture surgery; Knee arthroscopy; Prostate biopsy (09/26/2011); Circumcision (09/26/2011); Cystoscopy (09/26/2011); Knee surgery (Sept. 26, 2013); EVAR; Endovascular stent insertion (September 13, 2007); Chest tube insertion (1989); and Partial knee arthroplasty (Right, 04/26/2015).   His family history includes Cancer in his father; Cancer (age of onset: 5) in his brother; Heart attack in his sister; Heart disease in his brother; Hyperlipidemia in his brother and sister; Mesothelioma in his father; Stroke in his mother.He reports that he has been smoking Cigarettes.  He has a 21 pack-year smoking history. He has never used smokeless tobacco. He reports that he does not drink alcohol or  use illicit drugs.    ROS Review of Systems  Constitutional: Negative for fever, chills, activity change and appetite change.  HENT: Positive for congestion, postnasal drip, rhinorrhea and sinus pressure. Negative for ear discharge, ear pain, hearing loss, nosebleeds, sneezing and trouble swallowing.   Respiratory: Positive for cough. Negative for chest tightness and shortness of breath.   Cardiovascular: Negative for chest pain and palpitations.  Skin: Negative for rash.    Objective:  BP 133/86 mmHg  Pulse 81  Temp(Src) 97.6 F (36.4 C) (Oral)  Ht 5\' 8"  (1.727 m)  Wt 280 lb 9.6 oz (127.279 kg)  BMI 42.67 kg/m2  SpO2 96%  BP Readings from Last 3 Encounters:  07/05/15 133/86  05/31/15 132/90  05/20/15 101/61    Wt Readings from Last 3 Encounters:  07/05/15 280 lb 9.6 oz (127.279 kg)  05/31/15 281 lb 6.4 oz (127.642 kg)  05/19/15 270 lb (122.471 kg)     Physical Exam  Constitutional: He is oriented to person, place, and time. He appears well-developed and well-nourished. No distress.  HENT:  Head: Normocephalic and atraumatic.  Right Ear: External ear normal.  Left Ear: External ear normal.  Nose: Nose normal.  Mouth/Throat: Oropharynx is clear and moist.  Eyes: Conjunctivae and EOM are normal. Pupils are equal, round, and reactive to light.  Neck: Normal range of motion. Neck supple. No thyromegaly present.  Cardiovascular: Normal rate, regular rhythm and normal heart sounds.   No murmur heard. Pulmonary/Chest: Effort normal and breath sounds normal. No respiratory distress. He has no wheezes. He has no rales.  Abdominal: Soft. Bowel sounds are normal. He exhibits no distension. There is no tenderness.  Lymphadenopathy:    He has no cervical adenopathy.  Neurological: He is alert and oriented to  person, place, and time. He has normal reflexes.  Skin: Skin is warm and dry.  Psychiatric: He has a normal mood and affect. His behavior is normal. Judgment and thought  content normal.     Lab Results  Component Value Date   WBC 11.7* 04/27/2015   HGB 11.8* 04/27/2015   HCT 36.9* 04/27/2015   PLT 222 04/27/2015   GLUCOSE 151* 04/27/2015   CHOL 225* 12/30/2014   TRIG 165* 12/30/2014   HDL 38* 12/30/2014   LDLDIRECT 132.2 11/25/2009   LDLCALC 154* 12/30/2014   ALT 16* 04/20/2015   AST 16 04/20/2015   NA 141 04/27/2015   K 4.8 04/27/2015   CL 108 04/27/2015   CREATININE 1.89* 04/27/2015   BUN 27* 04/27/2015   CO2 23 04/27/2015   TSH 1.953 10/15/2012   PSA 0.37 10/01/2013   INR 1.17 04/20/2015   HGBA1C 6.1 % 10/15/2012    Ct Renal Stone Study  05/20/2015  CLINICAL DATA:  70 year old male with right flank pain. EXAM: CT ABDOMEN AND PELVIS WITHOUT CONTRAST TECHNIQUE: Multidetector CT imaging of the abdomen and pelvis was performed following the standard protocol without IV contrast. COMPARISON:  CT dated 06/11/2013 FINDINGS: Evaluation of this exam is limited in the absence of intravenous contrast. There is a focal area chronic appearing change with small cystic formation in the lingula. Minimal bibasilar atelectatic changes also noted. There is coronary vascular calcification. No intra-abdominal free air or free fluid. Stable appearing 1 cm hypodense lesion in the inferior aspect of the right lobe of the liver posteriorly, incompletely characterized, possibly a cyst or hemangioma. MRI may provide better evaluation. The gallbladder, pancreas, spleen appear unremarkable. There is mild bilateral adrenal nodularity. There is a 2 cm left adrenal fat containing nodule, likely an adenoma or myelo lipoma. There are two punctate nonobstructing right renal calculi. No hydronephrosis. There is moderate bilateral renal atrophy. Multiple bilateral renal hypodense lesions measuring up to 4.5 cm in the inferior pole of the left kidney are not characterized on this noncontrast CT but appear grossly stable compared to the prior study and likely represent cysts. Ultrasound  may provide better evaluation. There is no hydronephrosis or nephrolithiasis on the left. The visualized ureters appear unremarkable. There is apparent diffuse thickening of the bladder wall which may be partly related to underdistention. Cystitis is not excluded. Correlation with urinalysis recommended. Biopsy clips in the region of the prostate gland noted. There is moderate stool throughout the colon. Small scattered colonic diverticula noted. There is no evidence of bowel obstruction or inflammation. Normal appendix. An aorta bi-iliac endovascular stent graft repair is noted. Evaluation of the aorta is limited in the absence of intravenous contrast. There is no aneurysmal dilatation. No portal venous gas identified. There is no adenopathy. There is a small fat containing supraumbilical as well as a small fat containing umbilical hernia without inflammatory changes. The abdominal wall soft tissues are otherwise unremarkable. There is degenerative changes of the spine. No acute fracture. IMPRESSION: Punctate nonobstructing right renal calculi. No hydronephrosis. Mild apparent thickening of the urinary bladder. Correlation with urinalysis recommended to exclude UTI. No other acute intra-abdominal or pelvic pathology identified. Electronically Signed   By: Anner Crete M.D.   On: 05/20/2015 01:30    Assessment & Plan:   [name] was seen today for back pain and cough.  Diagnoses and all orders for this visit:  Spinal stenosis of lumbar region -     DG Lumbar Spine 2-3 Views; Future -  Ambulatory referral to Physical Therapy  Acute bronchitis due to Haemophilus influenzae  Other orders -     predniSONE (DELTASONE) 10 MG tablet; Take 5 daily for 3 days followed by 4,3,2 and 1 for 3 days each. -     amoxicillin-clavulanate (AUGMENTIN) 875-125 MG tablet; Take 1 tablet by mouth 2 (two) times daily. Take all of this medication -     HYDROcodone-homatropine (HYCODAN) 5-1.5 MG/5ML syrup; Take 5 mLs by  mouth every 6 (six) hours as needed for cough.      I am having [name] start on predniSONE, amoxicillin-clavulanate, and HYDROcodone-homatropine. I am also having him maintain his oxybutynin, bicalutamide, losartan, ketoconazole, amLODipine, methocarbamol, rivaroxaban, traMADol, allopurinol, cyclobenzaprine, gabapentin, pantoprazole, spironolactone, and cloNIDine.  Meds ordered this encounter  Medications  . predniSONE (DELTASONE) 10 MG tablet    Sig: Take 5 daily for 3 days followed by 4,3,2 and 1 for 3 days each.    Dispense:  45 tablet    Refill:  0  . amoxicillin-clavulanate (AUGMENTIN) 875-125 MG tablet    Sig: Take 1 tablet by mouth 2 (two) times daily. Take all of this medication    Dispense:  20 tablet    Refill:  0  . HYDROcodone-homatropine (HYCODAN) 5-1.5 MG/5ML syrup    Sig: Take 5 mLs by mouth every 6 (six) hours as needed for cough.    Dispense:  120 mL    Refill:  0     Follow-up: Return if symptoms worsen or fail to improve.  Claretta Fraise, M.D.

## 2015-07-13 DIAGNOSIS — Z471 Aftercare following joint replacement surgery: Secondary | ICD-10-CM | POA: Diagnosis not present

## 2015-07-13 DIAGNOSIS — Z96651 Presence of right artificial knee joint: Secondary | ICD-10-CM | POA: Diagnosis not present

## 2015-07-19 ENCOUNTER — Ambulatory Visit (INDEPENDENT_AMBULATORY_CARE_PROVIDER_SITE_OTHER): Payer: Medicare Other

## 2015-07-19 ENCOUNTER — Ambulatory Visit (INDEPENDENT_AMBULATORY_CARE_PROVIDER_SITE_OTHER): Payer: Medicare Other | Admitting: Family Medicine

## 2015-07-19 ENCOUNTER — Encounter: Payer: Self-pay | Admitting: Family Medicine

## 2015-07-19 VITALS — BP 134/79 | HR 71 | Temp 98.0°F | Ht 68.0 in | Wt 274.4 lb

## 2015-07-19 DIAGNOSIS — R600 Localized edema: Secondary | ICD-10-CM | POA: Diagnosis not present

## 2015-07-19 DIAGNOSIS — I10 Essential (primary) hypertension: Secondary | ICD-10-CM | POA: Diagnosis not present

## 2015-07-19 DIAGNOSIS — R0789 Other chest pain: Secondary | ICD-10-CM

## 2015-07-19 MED ORDER — SPIRONOLACTONE-HCTZ 25-25 MG PO TABS: 1.0000 | ORAL_TABLET | Freq: Every day | ORAL | Status: DC

## 2015-07-19 NOTE — Progress Notes (Signed)
Subjective:  Patient ID: [name], male    DOB: 1945/10/27  Age: 70 y.o. MRN: [id number]  CC: Foot Swelling and Chest Pain   HPI [name] presents for Pain at the right anterior axillary line approximately rib 5. He was working in the yard and his knee gave way and he fell and hit his right side at the upper thorax on the ground and/or his shovel. Now having pain that is moderate. It is not associated with shortness of breath. It is painful to lift the right upper extremity as in abduction.  The lower extremities have both been noted to have moderate swelling. This is not associated with shortness of breath. It seems to get worse through the day. It is been ongoing for several weeks, since his knee surgery. There is no other swelling. Urine flow is normal.   follow-up of hypertension. Patient has no history of headache chest pain or shortness of breath or recent cough. Patient also denies symptoms of TIA such as numbness weakness lateralizingStates taking  meds regularly.  History [name] has a past medical history of Coronary atherosclerosis of native coronary artery; Gout; GERD (gastroesophageal reflux disease); Essential hypertension, benign; COPD (chronic obstructive pulmonary disease) ([place]); Tubular adenoma of colon (08/2008); Prostate cancer ([place]); Aortic atherosclerosis ([place]); Shortness of breath dyspnea; Arthritis; History of kidney stones; Sleep apnea; and Neuropathy ([place]).   He has past surgical history that includes Aortic endograft repair (2004); Colonoscopy w/ polypectomy; Umbilical hernia repair; Fracture surgery; Knee arthroscopy; Prostate biopsy (09/26/2011); Circumcision (09/26/2011); Cystoscopy (09/26/2011); Knee surgery (Sept. 26, 2013); EVAR; Endovascular stent insertion (September 13, 2007); Chest tube insertion (1989); and Partial knee arthroplasty (Right, 04/26/2015).   His family history includes Cancer in his father; Cancer (age of onset: 49) in his brother; Heart  attack in his sister; Heart disease in his brother; Hyperlipidemia in his brother and sister; Mesothelioma in his father; Stroke in his mother.He reports that he has been smoking Cigarettes.  He has a 21 pack-year smoking history. He has never used smokeless tobacco. He reports that he does not drink alcohol or use illicit drugs.    ROS Review of Systems  Constitutional: Negative for fever, chills, diaphoresis and unexpected weight change.  HENT: Negative for congestion, hearing loss, rhinorrhea and sore throat.   Eyes: Negative for visual disturbance.  Respiratory: Negative for cough, chest tightness, shortness of breath, wheezing and stridor.   Cardiovascular: Positive for chest pain (se HPI).  Gastrointestinal: Negative for abdominal pain, diarrhea and constipation.  Genitourinary: Negative for dysuria and flank pain.  Musculoskeletal: Positive for joint swelling (ankles) and arthralgias (knees).  Skin: Negative for rash.  Neurological: Negative for dizziness and headaches.  Psychiatric/Behavioral: Negative for sleep disturbance and dysphoric mood.    Objective:  BP 134/79 mmHg  Pulse 71  Temp(Src) 98 F (36.7 C) (Oral)  Ht 5' 8" (1.727 m)  Wt 274 lb 6.4 oz (124.467 kg)  BMI 41.73 kg/m2  SpO2 97%  BP Readings from Last 3 Encounters:  07/19/15 134/79  07/05/15 133/86  05/31/15 132/90    Wt Readings from Last 3 Encounters:  07/19/15 274 lb 6.4 oz (124.467 kg)  07/05/15 280 lb 9.6 oz (127.279 kg)  05/31/15 281 lb 6.4 oz (127.642 kg)     Physical Exam  Constitutional: He appears well-developed and well-nourished.  HENT:  Head: Normocephalic and atraumatic.  Right Ear: Tympanic membrane and external ear normal. No decreased hearing is noted.  Left Ear: Tympanic membrane and external  ear normal. No decreased hearing is noted.  Mouth/Throat: No oropharyngeal exudate or posterior oropharyngeal erythema.  Eyes: Pupils are equal, round, and reactive to light.  Neck: Normal  range of motion. Neck supple.  Cardiovascular: Normal rate and regular rhythm.   No murmur heard. Pulmonary/Chest: Breath sounds normal. No respiratory distress.  Abdominal: Soft. Bowel sounds are normal. He exhibits no mass. There is no tenderness.  Vitals reviewed.    Lab Results  Component Value Date   WBC 11.7* 04/27/2015   HGB 11.8* 04/27/2015   HCT 36.9* 04/27/2015   PLT 222 04/27/2015   GLUCOSE 151* 04/27/2015   CHOL 225* 12/30/2014   TRIG 165* 12/30/2014   HDL 38* 12/30/2014   LDLDIRECT 132.2 11/25/2009   LDLCALC 154* 12/30/2014   ALT 16* 04/20/2015   AST 16 04/20/2015   NA 141 04/27/2015   K 4.8 04/27/2015   CL 108 04/27/2015   CREATININE 1.89* 04/27/2015   BUN 27* 04/27/2015   CO2 23 04/27/2015   TSH 1.953 10/15/2012   PSA 0.37 10/01/2013   INR 1.17 04/20/2015   HGBA1C 6.1 % 10/15/2012    Ct Renal Stone Study  05/20/2015  CLINICAL DATA:  70 year old male with right flank pain. EXAM: CT ABDOMEN AND PELVIS WITHOUT CONTRAST TECHNIQUE: Multidetector CT imaging of the abdomen and pelvis was performed following the standard protocol without IV contrast. COMPARISON:  CT dated 06/11/2013 FINDINGS: Evaluation of this exam is limited in the absence of intravenous contrast. There is a focal area chronic appearing change with small cystic formation in the lingula. Minimal bibasilar atelectatic changes also noted. There is coronary vascular calcification. No intra-abdominal free air or free fluid. Stable appearing 1 cm hypodense lesion in the inferior aspect of the right lobe of the liver posteriorly, incompletely characterized, possibly a cyst or hemangioma. MRI may provide better evaluation. The gallbladder, pancreas, spleen appear unremarkable. There is mild bilateral adrenal nodularity. There is a 2 cm left adrenal fat containing nodule, likely an adenoma or myelo lipoma. There are two punctate nonobstructing right renal calculi. No hydronephrosis. There is moderate bilateral  renal atrophy. Multiple bilateral renal hypodense lesions measuring up to 4.5 cm in the inferior pole of the left kidney are not characterized on this noncontrast CT but appear grossly stable compared to the prior study and likely represent cysts. Ultrasound may provide better evaluation. There is no hydronephrosis or nephrolithiasis on the left. The visualized ureters appear unremarkable. There is apparent diffuse thickening of the bladder wall which may be partly related to underdistention. Cystitis is not excluded. Correlation with urinalysis recommended. Biopsy clips in the region of the prostate gland noted. There is moderate stool throughout the colon. Small scattered colonic diverticula noted. There is no evidence of bowel obstruction or inflammation. Normal appendix. An aorta bi-iliac endovascular stent graft repair is noted. Evaluation of the aorta is limited in the absence of intravenous contrast. There is no aneurysmal dilatation. No portal venous gas identified. There is no adenopathy. There is a small fat containing supraumbilical as well as a small fat containing umbilical hernia without inflammatory changes. The abdominal wall soft tissues are otherwise unremarkable. There is degenerative changes of the spine. No acute fracture. IMPRESSION: Punctate nonobstructing right renal calculi. No hydronephrosis. Mild apparent thickening of the urinary bladder. Correlation with urinalysis recommended to exclude UTI. No other acute intra-abdominal or pelvic pathology identified. Electronically Signed   By: Anner Crete M.D.   On: 05/20/2015 01:30    Assessment & Plan:  [name] was seen today for foot swelling and chest pain.  Diagnoses and all orders for this visit:  Acute chest wall pain -     DG Ribs Unilateral W/Chest Right; Future -     CMP14+EGFR  Localized edema -     CMP14+EGFR  Essential hypertension, benign -     CMP14+EGFR  Other orders -     spironolactone-hydrochlorothiazide  (ALDACTAZIDE) 25-25 MG tablet; Take 1 tablet by mouth daily.    X-ray reveals minimal nondisplaced fracture of the right anterior seventh rib. This is at the point of maximal tenderness. There was no evidence for pneumothorax.  I have discontinued [name]'s bicalutamide, methocarbamol, gabapentin, spironolactone, predniSONE, amoxicillin-clavulanate, and HYDROcodone-homatropine. I am also having him start on spironolactone-hydrochlorothiazide. Additionally, I am having him maintain his oxybutynin, losartan, ketoconazole, amLODipine, rivaroxaban, traMADol, allopurinol, cyclobenzaprine, pantoprazole, and cloNIDine.  Meds ordered this encounter  Medications  . spironolactone-hydrochlorothiazide (ALDACTAZIDE) 25-25 MG tablet    Sig: Take 1 tablet by mouth daily.    Dispense:  30 tablet    Refill:  2     Follow-up: Return in about 1 month (around 08/18/2015).  Claretta Fraise, M.D.

## 2015-07-20 ENCOUNTER — Ambulatory Visit (HOSPITAL_COMMUNITY): Payer: Medicare Other | Admitting: Physical Therapy

## 2015-07-20 DIAGNOSIS — G600 Hereditary motor and sensory neuropathy: Secondary | ICD-10-CM | POA: Diagnosis not present

## 2015-07-20 DIAGNOSIS — M79671 Pain in right foot: Secondary | ICD-10-CM | POA: Diagnosis not present

## 2015-07-20 DIAGNOSIS — M79672 Pain in left foot: Secondary | ICD-10-CM | POA: Diagnosis not present

## 2015-07-20 DIAGNOSIS — G5790 Unspecified mononeuropathy of unspecified lower limb: Secondary | ICD-10-CM | POA: Diagnosis not present

## 2015-07-20 LAB — CMP14+EGFR
ALT: 13 IU/L (ref 0–44)
AST: 14 IU/L (ref 0–40)
Albumin/Globulin Ratio: 1.9 (ref 1.2–2.2)
Albumin: 4.6 g/dL (ref 3.5–4.8)
Alkaline Phosphatase: 77 IU/L (ref 39–117)
BUN/Creatinine Ratio: 13 (ref 10–24)
BUN: 25 mg/dL (ref 8–27)
Bilirubin Total: 0.4 mg/dL (ref 0.0–1.2)
CO2: 19 mmol/L (ref 18–29)
Calcium: 9.2 mg/dL (ref 8.6–10.2)
Chloride: 104 mmol/L (ref 96–106)
Creatinine, Ser: 1.88 mg/dL — ABNORMAL HIGH (ref 0.76–1.27)
GFR calc Af Amer: 41 mL/min/{1.73_m2} — ABNORMAL LOW (ref >59)
GFR calc non Af Amer: 35 mL/min/{1.73_m2} — ABNORMAL LOW (ref >59)
Globulin, Total: 2.4 g/dL (ref 1.5–4.5)
Glucose: 91 mg/dL (ref 65–99)
Potassium: 4.7 mmol/L (ref 3.5–5.2)
Sodium: 142 mmol/L (ref 134–144)
Total Protein: 7 g/dL (ref 6.0–8.5)

## 2015-07-22 ENCOUNTER — Emergency Department (HOSPITAL_COMMUNITY)
Admission: EM | Admit: 2015-07-22 | Discharge: 2015-07-23 | Disposition: A | Discharge status: Home / Self Care | Payer: Medicare Other | Attending: Emergency Medicine | Admitting: Emergency Medicine

## 2015-07-22 ENCOUNTER — Encounter (HOSPITAL_COMMUNITY): Payer: Self-pay

## 2015-07-22 DIAGNOSIS — F17210 Nicotine dependence, cigarettes, uncomplicated: Secondary | ICD-10-CM | POA: Diagnosis not present

## 2015-07-22 DIAGNOSIS — R42 Dizziness and giddiness: Secondary | ICD-10-CM | POA: Diagnosis not present

## 2015-07-22 DIAGNOSIS — I959 Hypotension, unspecified: Secondary | ICD-10-CM | POA: Insufficient documentation

## 2015-07-22 DIAGNOSIS — M1990 Unspecified osteoarthritis, unspecified site: Secondary | ICD-10-CM | POA: Insufficient documentation

## 2015-07-22 DIAGNOSIS — I951 Orthostatic hypotension: Secondary | ICD-10-CM

## 2015-07-22 DIAGNOSIS — J449 Chronic obstructive pulmonary disease, unspecified: Secondary | ICD-10-CM | POA: Diagnosis not present

## 2015-07-22 DIAGNOSIS — Z471 Aftercare following joint replacement surgery: Secondary | ICD-10-CM | POA: Diagnosis not present

## 2015-07-22 DIAGNOSIS — Z79899 Other long term (current) drug therapy: Secondary | ICD-10-CM | POA: Insufficient documentation

## 2015-07-22 DIAGNOSIS — Z96651 Presence of right artificial knee joint: Secondary | ICD-10-CM | POA: Diagnosis not present

## 2015-07-22 NOTE — ED Notes (Signed)
Pt states he has been dizzy for about 3 weeks, states he has not been able to get in to see his doctor.  Pt states he needs to work tomorrow and just doesn't feel like he can make it d/t same.  Pt denies pain or other complaints other than pain from a recent right rib fracture from a fall

## 2015-07-23 ENCOUNTER — Emergency Department (HOSPITAL_COMMUNITY): Payer: Medicare Other

## 2015-07-23 ENCOUNTER — Telehealth: Payer: Self-pay | Admitting: Pediatrics

## 2015-07-23 ENCOUNTER — Encounter (HOSPITAL_COMMUNITY): Payer: Self-pay | Admitting: Emergency Medicine

## 2015-07-23 DIAGNOSIS — R42 Dizziness and giddiness: Secondary | ICD-10-CM | POA: Diagnosis not present

## 2015-07-23 DIAGNOSIS — Z471 Aftercare following joint replacement surgery: Secondary | ICD-10-CM | POA: Diagnosis not present

## 2015-07-23 DIAGNOSIS — I959 Hypotension, unspecified: Secondary | ICD-10-CM | POA: Diagnosis not present

## 2015-07-23 DIAGNOSIS — Z96651 Presence of right artificial knee joint: Secondary | ICD-10-CM | POA: Diagnosis not present

## 2015-07-23 LAB — CBC
HCT: 37.3 % — ABNORMAL LOW (ref 39.0–52.0)
Hemoglobin: 12.7 g/dL — ABNORMAL LOW (ref 13.0–17.0)
MCH: 30.7 pg (ref 26.0–34.0)
MCHC: 34 g/dL (ref 30.0–36.0)
MCV: 90.1 fL (ref 78.0–100.0)
Platelets: 258 10*3/uL (ref 150–400)
RBC: 4.14 MIL/uL — ABNORMAL LOW (ref 4.22–5.81)
RDW: 14.9 % (ref 11.5–15.5)
WBC: 8.4 10*3/uL (ref 4.0–10.5)

## 2015-07-23 LAB — URINALYSIS, ROUTINE W REFLEX MICROSCOPIC
Bilirubin Urine: NEGATIVE
Glucose, UA: NEGATIVE mg/dL
Hgb urine dipstick: NEGATIVE
Ketones, ur: NEGATIVE mg/dL
Leukocytes, UA: NEGATIVE
Nitrite: NEGATIVE
Protein, ur: NEGATIVE mg/dL
Specific Gravity, Urine: 1.015 (ref 1.005–1.030)
pH: 6 (ref 5.0–8.0)

## 2015-07-23 LAB — BASIC METABOLIC PANEL
Anion gap: 11 (ref 5–15)
BUN: 27 mg/dL — ABNORMAL HIGH (ref 6–20)
CO2: 20 mmol/L — ABNORMAL LOW (ref 22–32)
Calcium: 8.9 mg/dL (ref 8.9–10.3)
Chloride: 108 mmol/L (ref 101–111)
Creatinine, Ser: 2.2 mg/dL — ABNORMAL HIGH (ref 0.61–1.24)
GFR calc Af Amer: 33 mL/min — ABNORMAL LOW (ref >60)
GFR calc non Af Amer: 29 mL/min — ABNORMAL LOW (ref >60)
Glucose, Bld: 120 mg/dL — ABNORMAL HIGH (ref 65–99)
Potassium: 3.7 mmol/L (ref 3.5–5.1)
Sodium: 139 mmol/L (ref 135–145)

## 2015-07-23 LAB — TROPONIN I: Troponin I: <0.03 ng/mL (ref <0.031)

## 2015-07-23 MED ORDER — SODIUM CHLORIDE 0.9 % IV BOLUS (SEPSIS)
1000.0000 mL | Freq: Once | INTRAVENOUS | Status: AC
  Administered 2015-07-23: 1000 mL via INTRAVENOUS

## 2015-07-23 NOTE — Discharge Instructions (Signed)
Please hold your blood pressure medications for now (which are the medications that we have starred on your paperwork). Please check your blood pressure regularly at home. If your blood pressure is over 160/100, please restart your blood pressure medication. Please call your primary care doctor first thing in the morning to schedule close follow-up. I recommend you increase your fluid intake at home. Please note that pain medication such as hydrocodone and tramadol can drop your blood pressure and make you feel lightheaded. I recommend that when you get from a seating or lying position that you do so slowly and give yourself time to get accommodated before you begin walking.   Hypotension As your heart beats, it forces blood through your arteries. This force is your blood pressure. If your blood pressure is too low for you to go about your normal activities or to support the organs of your body, you have hypotension. Hypotension is also referred to as low blood pressure. When your blood pressure becomes too low, you may not get enough blood to your brain. As a result, you may feel weak, feel lightheaded, or develop a rapid heart rate. In a more severe case, you may faint. CAUSES Various conditions can cause hypotension. These include:  Blood loss.  Dehydration.  Heart or endocrine problems.  Pregnancy.  Severe infection.  Not having a well-balanced diet filled with needed nutrients.  Severe allergic reactions (anaphylaxis). Some medicines, such as blood pressure medicine or water pills (diuretics), may lower your blood pressure below normal. Sometimes taking too much medicine or taking medicine not as directed can cause hypotension. TREATMENT  Hospitalization is sometimes required for hypotension if fluid or blood replacement is needed, if time is needed for medicines to wear off, or if further monitoring is needed. Treatment might include changing your diet, changing your medicines (including  medicines aimed at raising your blood pressure), and use of support stockings. HOME CARE INSTRUCTIONS   Drink enough fluids to keep your urine clear or pale yellow.  Take your medicines as directed by your health care provider.  Get up slowly from reclining or sitting positions. This gives your blood pressure a chance to adjust.  Wear support stockings as directed by your health care provider.  Maintain a healthy diet by including nutritious food, such as fruits, vegetables, nuts, whole grains, and lean meats. SEEK MEDICAL CARE IF:  You have vomiting or diarrhea.  You have a fever for more than 2-3 days.  You feel more thirsty than usual.  You feel weak and tired. SEEK IMMEDIATE MEDICAL CARE IF:   You have chest pain or a fast or irregular heartbeat.  You have a loss of feeling in some part of your body, or you lose movement in your arms or legs.  You have trouble speaking.  You become sweaty or feel lightheaded.  You faint. MAKE SURE YOU:   Understand these instructions.  Will watch your condition.  Will get help right away if you are not doing well or get worse.   This information is not intended to replace advice given to you by your health care provider. Make sure you discuss any questions you have with your health care provider.   Document Released: 03/20/2005 Document Revised: 01/08/2013 Document Reviewed: 09/20/2012 Elsevier Interactive Patient Education [employer].

## 2015-07-23 NOTE — ED Notes (Signed)
Pt ambulated briskly up and down hall in front of the nurses station. States he does not feel dizzy or light headed- Dr Leonides Schanz informed

## 2015-07-23 NOTE — Telephone Encounter (Signed)
Appointment given for Monday with [name],

## 2015-07-23 NOTE — ED Notes (Signed)
Pt reports a 3 weeks history of dizziness with falls- He went to the doctor for one fall was dx'd with cracked ribs, but did not report the dizziness- He is alert, conversant with his wife at bedside

## 2015-07-23 NOTE — ED Provider Notes (Signed)
By signing my name below, I, Helane Gunther, attest that this documentation has been prepared under the direction and in the presence of [employer], DO. Electronically Signed: Helane Gunther, ED Scribe. 07/23/2015. 12:25 AM.  TIME SEEN: 12:10 AM  CHIEF COMPLAINT: Dizziness  HPI: [name] is a 70 y.o. male with a PMHx of CAD, aortic penetrating ulcer requiring endograft in 2004, essential HTN, COPD, neuropathy and prostate cancer who presents to the Emergency Department complaining of dizziness onset 2 weeks ago, worsening significantly as of 5 days ago. Pt states he fell 5 days ago while working in the yard due to dizziness and was diagnosed with a right-sided rib fx by his primary care physician. He notes that since then his right knee, on which he had surgery, has been sore. He states no XR was taken of the knee at the time of his fall. He denies hitting his head and LOC. He notes he "wobbles" when he walks due to lightheadedness. He denies any sensation of room-spinning. He notes alleviation of the dizziness with lying supine. He states he was prescribed tramadol after his knee surgery, which he has only been taking PRN, and was recommended to take for his rib fracture as well. He states his last ose of tramadol was yesterday at 8 PM. He reports he was recently taking hydrocodone cough syrup and amoxicillin 2 weeks ago when he had a cough. Reports that this is what started the lightheadedness. States the cough is gone. Reports he is no longer taking hydrocodone. He reports that he chronically has a dry mouth and has to drink lots of water at night. He notes a PMHx of prostate cancer for which he is being treated by Dr Alen Blew with Lupron and radiation therapy. He states his last radiation therapy was 7-8 months ago.  Per wife, pt's BP is typically 160/100-110, and notes pt has been taking his medications regularly. Pt denies recent headaches, SOB, n/v/d, bloody or black and tarry stools,  one-sided numbness or weakness, visual disturbances, changes in hearing, tinnitus, and ear pain.   ROS: See HPI Constitutional: no fever  Eyes: no drainage  ENT: no runny nose   Cardiovascular:  no chest pain  Resp: no SOB  GI: no vomiting GU: no dysuria Integumentary: no rash  Allergy: no hives  Musculoskeletal: no leg swelling  Neurological: no slurred speech ROS otherwise negative  PAST MEDICAL HISTORY/PAST SURGICAL HISTORY:  Past Medical History  Diagnosis Date  . Coronary atherosclerosis of native coronary artery     Mild atherosclerosis at catheterization 2011  . Gout   . GERD (gastroesophageal reflux disease)   . Essential hypertension, benign   . COPD (chronic obstructive pulmonary disease) ([place])   . Tubular adenoma of colon 08/2008  . Prostate cancer ([place])     radiation tx  . Aortic atherosclerosis (H[facility])     Penetrating aortic ulcer s/p endograft 2004 - followed by Dr. Trula Slade  . Shortness of breath dyspnea     with exertion  . Arthritis   . History of kidney stones   . Sleep apnea     Intolerant of CPAP  . Neuropathy ([place])     TOES    MEDICATIONS:  Prior to Admission medications   Medication Sig Start Date End Date Taking? Authorizing Provider  allopurinol (ZYLOPRIM) 300 MG tablet TAKE ONE TABLET BY MOUTH ONCE DAILY 05/14/15  Yes Eustaquio Maize, MD  amLODipine (NORVASC) 10 MG tablet Take 1 tablet (10 mg total)  by mouth daily. 03/25/15  Yes Eustaquio Maize, MD  carvedilol (COREG) 25 MG tablet Take 25 mg by mouth 2 (two) times daily with a meal.   Yes Historical Provider, MD  cloNIDine (CATAPRES) 0.1 MG tablet TAKE TWO TABLETS BY MOUTH TWICE DAILY 06/11/15  Yes Eustaquio Maize, MD  losartan (COZAAR) 50 MG tablet Take 1 tablet (50 mg total) by mouth daily. 02/26/15  Yes Wardell Honour, MD  oxybutynin (DITROPAN) 5 MG tablet Take 5 mg by mouth daily.  04/22/13  Yes Historical Provider, MD  pantoprazole (PROTONIX) 40 MG tablet Take 1 tablet (40 mg total) by mouth  2 (two) times daily. 06/07/15  Yes Eustaquio Maize, MD  spironolactone-hydrochlorothiazide (ALDACTAZIDE) 25-25 MG tablet Take 1 tablet by mouth daily. 07/19/15  Yes Claretta Fraise, MD  cyclobenzaprine (FLEXERIL) 5 MG tablet Take 1 tablet (5 mg total) by mouth 3 (three) times daily as needed (for muscle pain). 05/20/15   Rolland Porter, MD  ketoconazole (NIZORAL) 2 % cream Apply 1 application topically daily. 03/17/15   Eustaquio Maize, MD  rivaroxaban (XARELTO) 10 MG TABS tablet Take 1 tablet (10 mg total) by mouth daily with breakfast. Xarelto 10 mg daily for ten days, then change to Aspirin 325 mg daily for two weeks, then reduce to Baby Aspirin 81 mg daily for three additional weeks. 04/27/15   Arlee Muslim, PA-C  traMADol (ULTRAM) 50 MG tablet Take 1-2 tablets (50-100 mg total) by mouth every 6 (six) hours as needed (mild pain). 04/27/15   Arlee Muslim, PA-C    ALLERGIES:  Allergies  Allergen Reactions  . Carbidopa-Levodopa Other (See Comments)    hallunications  . Morphine Sulfate Nausea And Vomiting  . Sulfacetamide Sodium Nausea And Vomiting  . Sulfamethoxazole-Trimethoprim Nausea And Vomiting    SOCIAL HISTORY:  Social History  Substance Use Topics  . Smoking status: Current Every Day Smoker -- 0.50 packs/day for 42 years    Types: Cigarettes  . Smokeless tobacco: Never Used     Comment: 12 ciggs per day  . Alcohol Use: No    FAMILY HISTORY: Family History  Problem Relation Age of Onset  . Stroke Mother   . Mesothelioma Father   . Cancer Father   . Hyperlipidemia Sister   . Heart attack Sister   . Cancer Brother 90  . Heart disease Brother   . Hyperlipidemia Brother     EXAM: BP 102/65 mmHg  Pulse 71  Temp(Src) 97.9 F (36.6 C) (Oral)  Resp 22  Ht 5\' 8"  (1.727 m)  Wt 278 lb (126.1 kg)  BMI 42.28 kg/m2  SpO2 95% CONSTITUTIONAL: Alert and oriented and responds appropriately to questions. Well-appearing; well-nourished; Elderly HEAD: Normocephalic EYES: Conjunctivae  clear, PERRL ENT: normal nose; no rhinorrhea; dry mucous membranes; No pharyngeal erythema or petechiae, no tonsillar hypertrophy or exudate, no uvular deviation, no trismus or drooling, normal phonation, no stridor, no dental caries or abscess noted, no Ludwig's angina, tongue sits flat in the bottom of the mouth NECK: Supple, no meningismus, no LAD, no midline spinal tenderness or step-off or deformity  CARD: RRR; S1 and S2 appreciated; no murmurs, no clicks, no rubs, no gallops CHEST:  Tender to palpation over the right lateral chest wall without crepitus, ecchymosis or deformity. No flail chest. RESP: Normal chest excursion without splinting or tachypnea; breath sounds clear and equal bilaterally; no wheezes, no rhonchi, no rales, no hypoxia or respiratory distress, speaking full sentences ABD/GI: Normal bowel sounds; non-distended; soft, non-tender,  no rebound, no guarding, no peritoneal signs BACK:  The back appears normal and is non-tender to palpation, there is no CVA tenderness, no midline spine tenderness or step-off or deformity EXT: Tender to palpation diffusely over the right knee without deformity, joint effusion, ecchymosis. No tenderness over the right hip, right ankle or right foot. Normal ROM in all joints; otherwise extreme bizarre non-tender to palpation; no edema; normal capillary refill; no cyanosis, no calf tenderness or swelling    SKIN: Normal color for age and race; warm; no rash NEURO: Moves all extremities equally, sensation to light touch intact diffusely, cranial nerves II through XII intact; strength 5/5 in all 4 extremities, normal heel-to-shin testing, no dysmetria with finger-to-nose testing PSYCH: The patient's mood and manner are appropriate. Grooming and personal hygiene are appropriate.  MEDICAL DECISION MAKING: Patient here with lightheadedness. He does have slightly lower blood pressure than normal. Suspect this could be from recent narcotic use but he is on  multiple blood pressure medications. He denies any recent infectious symptoms over the last several days.  He does appear dry on exam. Will give IV fluids and check orthostatic vital signs. We'll also obtain labs to make sure that there is no sign of anemia, electrolyte abnormality, ACS, UTI that could be contributing to his symptoms. Given his recent fall, will obtain an x-ray of his right knee. He has been ambulatory. He just had an x-ray of his chest which showed a right seventh rib fracture without pneumothorax, infiltrate, edema. We'll also obtain a CT of his head given his history of prostate cancer although he is neurologically intact currently. He has no complaints of headache.  ED PROGRESS: Patient's workup has been unremarkable. Troponin negative. Hemoglobin is 12.7. No electrolyte abnormality. He does have chronic kidney disease which appears unchanged. Urine shows no sign of infection, ketones. CT of his head shows no acute intracranial abnormality. X-ray of the right knee shows no acute injury. Patient reports feeling much better after IV hydration. He had already received approximate 500 mL of IV fluids before orthostatic vital signs have been checked.  He has been able to ambulate and reports that he is no longer dizzy. I feel he is safe to go home and he agrees with this plan. I have advised him to hold his blood pressure medication and to be checking his blood pressure medicine at home. I have advised him to call his PCP at [facility] in the morning to schedule close follow-up. Discussed with patient that this is very important because he does need blood pressure control because of his history of penetrating aortic ulcer with endograft but he has had pressures in the 90s/60s here which I think is what is contributing to his symptoms and puts him at risk for further falls at home. Blood pressure has improved with IV hydration. He would like discharge home. Patient and wife are comfortable  with plan to check his blood pressure at home closely and follow-up with his PCP. Have advised him if his blood pressure begins to rise and is over 160/100 that he should restart his blood pressure medication.   At this time, I do not feel there is any life-threatening condition present. I have reviewed and discussed all results (EKG, imaging, lab, urine as appropriate), exam findings with patient. I have reviewed nursing notes and appropriate previous records.  I feel the patient is safe to be discharged home without further emergent workup. Discussed usual and customary return precautions. Patient and family (  if present) verbalize understanding and are comfortable with this plan.  Patient will follow-up with their primary care provider. If they do not have a primary care provider, information for follow-up has been provided to them. All questions have been answered.      EKG Interpretation  Date/Time:  Thursday July 22 2015 23:55:37 EDT Ventricular Rate:  70 PR Interval:  232 QRS Duration: 107 QT Interval:  444 QTC Calculation: 479 R Axis:   -55 Text Interpretation:  Sinus rhythm with first degree AV block Left anterior fascicular block Low voltage, precordial leads Abnormal R-wave progression, late transition Borderline prolonged QT interval No significant change since last tracing in 2014 Confirmed by WARD,  DO, KRISTEN [phone]) on 07/23/2015 12:30:11 AM       I personally performed the services described in this documentation, which was scribed in my presence. The recorded information has been reviewed and is accurate.   Justice, DO 07/23/15 0230

## 2015-07-23 NOTE — ED Notes (Signed)
Physician at bedside.

## 2015-07-25 DIAGNOSIS — Z79899 Other long term (current) drug therapy: Secondary | ICD-10-CM | POA: Diagnosis not present

## 2015-07-25 DIAGNOSIS — E7800 Pure hypercholesterolemia, unspecified: Secondary | ICD-10-CM | POA: Diagnosis not present

## 2015-07-25 DIAGNOSIS — I10 Essential (primary) hypertension: Secondary | ICD-10-CM | POA: Diagnosis not present

## 2015-07-25 DIAGNOSIS — C61 Malignant neoplasm of prostate: Secondary | ICD-10-CM | POA: Diagnosis not present

## 2015-07-25 DIAGNOSIS — R42 Dizziness and giddiness: Secondary | ICD-10-CM | POA: Diagnosis not present

## 2015-07-25 DIAGNOSIS — F17200 Nicotine dependence, unspecified, uncomplicated: Secondary | ICD-10-CM | POA: Diagnosis not present

## 2015-07-26 ENCOUNTER — Ambulatory Visit (INDEPENDENT_AMBULATORY_CARE_PROVIDER_SITE_OTHER): Payer: Medicare Other | Admitting: Family Medicine

## 2015-07-26 ENCOUNTER — Encounter: Payer: Self-pay | Admitting: Family Medicine

## 2015-07-26 VITALS — BP 130/90 | HR 89 | Temp 97.0°F | Ht 68.0 in | Wt 270.0 lb

## 2015-07-26 DIAGNOSIS — R42 Dizziness and giddiness: Secondary | ICD-10-CM

## 2015-07-26 MED ORDER — CARVEDILOL 25 MG PO TABS: 25.0000 mg | ORAL_TABLET | Freq: Two times a day (BID) | ORAL | Status: DC

## 2015-07-26 NOTE — Progress Notes (Signed)
BP 130/90 mmHg  Pulse 89  Temp(Src) 97 F (36.1 C) (Oral)  Ht 5\' 8"  (1.727 m)  Wt 270 lb (122.471 kg)  BMI 41.06 kg/m2   Subjective:    Patient ID: [name], male    DOB: 04/18/1945, 70 y.o.   MRN: [id number]  HPI: [name] is a 70 y.o. male presenting on 07/26/2015 for ER Followup   HPI Dizziness and lightheadedness Patient has been having dizziness and lightheadedness over the past few days. He thinks that it may have started when he began taking hydrochlorothiazide on top of his current medications. He felt dizzy and like he was going to pass out. It is worse when he stood up. He was seen in the emergency department notes [facility] 3 days ago and then yesterday at [facility]. They did an extensive workup for both cardiac and neurological and did not find any causes. He was found to be orthostatic yesterday at [facility]. He has been on multiple blood pressure medications. He was told to hold quite a few of these until seeing [place] today. Currently today's been taking Cozaar and none of the other blood pressure medications. He denies any headaches or lightheadedness. The dizziness has mostly resolved but is still slightly there. His blood pressure today is 130/90. He does have a machine at home to check a mole check over the next couple weeks and see where he is running. He denies any chest pain or shortness of breath. He denies any visual changes.  Relevant past medical, surgical, family and social history reviewed and updated as indicated. Interim medical history since our last visit reviewed. Allergies and medications reviewed and updated.  Review of Systems  Constitutional: Negative for fever and chills.  HENT: Negative for ear discharge and ear pain.   Eyes: Negative for discharge and visual disturbance.  Respiratory: Negative for shortness of breath and wheezing.   Cardiovascular: Negative for chest pain and leg swelling.  Gastrointestinal: Negative for abdominal  pain, diarrhea and constipation.  Genitourinary: Negative for difficulty urinating.  Musculoskeletal: Negative for back pain and gait problem.  Skin: Negative for rash.  Neurological: Positive for dizziness. Negative for syncope, light-headedness and headaches.  All other systems reviewed and are negative.   Per HPI unless specifically indicated above     Medication List       This list is accurate as of: 07/26/15 10:19 AM.  Always use your most recent med list.               allopurinol 300 MG tablet  Commonly known as:  ZYLOPRIM  TAKE ONE TABLET BY MOUTH ONCE DAILY     COZAAR 50 MG tablet  Generic drug:  losartan  Take 50 mg by mouth daily.     meclizine 25 MG tablet  Commonly known as:  ANTIVERT  Take 25 mg by mouth 3 (three) times daily as needed for dizziness.     oxybutynin 5 MG tablet  Commonly known as:  DITROPAN  Take 5 mg by mouth daily. Reported on 07/26/2015     pantoprazole 40 MG tablet  Commonly known as:  PROTONIX  Take 1 tablet (40 mg total) by mouth 2 (two) times daily.           Objective:    BP 130/90 mmHg  Pulse 89  Temp(Src) 97 F (36.1 C) (Oral)  Ht 5\' 8"  (1.727 m)  Wt 270 lb (122.471 kg)  BMI 41.06 kg/m2  Wt Readings from  Last 3 Encounters:  07/26/15 270 lb (122.471 kg)  07/23/15 278 lb (126.1 kg)  07/19/15 274 lb 6.4 oz (124.467 kg)    Physical Exam  Constitutional: He is oriented to person, place, and time. He appears well-developed and well-nourished. No distress.  Eyes: Conjunctivae and EOM are normal. Pupils are equal, round, and reactive to light. Right eye exhibits no discharge. No scleral icterus.  Neck: Neck supple. No thyromegaly present.  Cardiovascular: Normal rate, regular rhythm, normal heart sounds and intact distal pulses.   No murmur heard. Pulmonary/Chest: Effort normal and breath sounds normal. No respiratory distress. He has no wheezes.  Musculoskeletal: Normal range of motion. He exhibits no edema.    Lymphadenopathy:    He has no cervical adenopathy.  Neurological: He is alert and oriented to person, place, and time. Coordination normal.  Skin: Skin is warm and dry. No rash noted. He is not diaphoretic.  Psychiatric: He has a normal mood and affect. His behavior is normal.  Vitals reviewed.   Results for orders placed or performed during the hospital encounter of [id number]  Basic metabolic panel  Result Value Ref Range   Sodium 139 135 - 145 mmol/L   Potassium 3.7 3.5 - 5.1 mmol/L   Chloride 108 101 - 111 mmol/L   CO2 20 (L) 22 - 32 mmol/L   Glucose, Bld 120 (H) 65 - 99 mg/dL   BUN 27 (H) 6 - 20 mg/dL   Creatinine, Ser 2.20 (H) 0.61 - 1.24 mg/dL   Calcium 8.9 8.9 - 10.3 mg/dL   GFR calc non Af Amer 29 (L) >60 mL/min   GFR calc Af Amer 33 (L) >60 mL/min   Anion gap 11 5 - 15  CBC  Result Value Ref Range   WBC 8.4 4.0 - 10.5 K/uL   RBC 4.14 (L) 4.22 - 5.81 MIL/uL   Hemoglobin 12.7 (L) 13.0 - 17.0 g/dL   HCT 37.3 (L) 39.0 - 52.0 %   MCV 90.1 78.0 - 100.0 fL   MCH 30.7 26.0 - 34.0 pg   MCHC 34.0 30.0 - 36.0 g/dL   RDW 14.9 11.5 - 15.5 %   Platelets 258 150 - 400 K/uL  Urinalysis, Routine w reflex microscopic (not at [facility])  Result Value Ref Range   Color, Urine YELLOW YELLOW   APPearance CLEAR CLEAR   Specific Gravity, Urine 1.015 1.005 - 1.030   pH 6.0 5.0 - 8.0   Glucose, UA NEGATIVE NEGATIVE mg/dL   Hgb urine dipstick NEGATIVE NEGATIVE   Bilirubin Urine NEGATIVE NEGATIVE   Ketones, ur NEGATIVE NEGATIVE mg/dL   Protein, ur NEGATIVE NEGATIVE mg/dL   Nitrite NEGATIVE NEGATIVE   Leukocytes, UA NEGATIVE NEGATIVE  Troponin I  Result Value Ref Range   Troponin I <0.03 <0.031 ng/mL      Assessment & Plan:   Problem List Items Addressed This Visit    None    Visit Diagnoses    Dizziness    -  Primary    Hold all blood pressure meds except Cozaar and carvedilol, return in 2 weeks        Follow up plan: Return in about 2 weeks (around 08/09/2015), or if symptoms  worsen or fail to improve, for Hypertension recheck.  Counseling provided for all of the vaccine components No orders of the defined types were placed in this encounter.    Caryl Pina, MD [facility] 07/26/2015, 10:19 AM

## 2015-07-29 ENCOUNTER — Telehealth: Payer: Self-pay | Admitting: Pediatrics

## 2015-07-29 ENCOUNTER — Telehealth: Payer: Self-pay | Admitting: *Deleted

## 2015-07-29 NOTE — Telephone Encounter (Signed)
Pt was given appt, nothing further needed.

## 2015-07-29 NOTE — Telephone Encounter (Signed)
Patient called stating that he is still feeling dizzy.  Patient states 129/86.  Patient would like to be sooner if possible.  Appt made for 5/3 at 12:25 to see Dr. Warrick Parisian

## 2015-08-04 ENCOUNTER — Encounter: Payer: Self-pay | Admitting: Family Medicine

## 2015-08-04 ENCOUNTER — Ambulatory Visit (INDEPENDENT_AMBULATORY_CARE_PROVIDER_SITE_OTHER): Payer: Medicare Other | Admitting: Family Medicine

## 2015-08-04 VITALS — BP 130/78 | HR 84 | Temp 97.9°F | Ht 68.0 in | Wt 271.6 lb

## 2015-08-04 DIAGNOSIS — N289 Disorder of kidney and ureter, unspecified: Secondary | ICD-10-CM

## 2015-08-04 DIAGNOSIS — R42 Dizziness and giddiness: Secondary | ICD-10-CM | POA: Diagnosis not present

## 2015-08-04 DIAGNOSIS — N189 Chronic kidney disease, unspecified: Secondary | ICD-10-CM

## 2015-08-04 DIAGNOSIS — G44219 Episodic tension-type headache, not intractable: Secondary | ICD-10-CM | POA: Diagnosis not present

## 2015-08-04 LAB — BMP8+EGFR
BUN/Creatinine Ratio: 9 — ABNORMAL LOW (ref 10–24)
BUN: 15 mg/dL (ref 8–27)
CO2: 20 mmol/L (ref 18–29)
Calcium: 9 mg/dL (ref 8.6–10.2)
Chloride: 104 mmol/L (ref 96–106)
Creatinine, Ser: 1.61 mg/dL — ABNORMAL HIGH (ref 0.76–1.27)
GFR calc Af Amer: 49 mL/min/{1.73_m2} — ABNORMAL LOW (ref >59)
GFR calc non Af Amer: 43 mL/min/{1.73_m2} — ABNORMAL LOW (ref >59)
Glucose: 101 mg/dL — ABNORMAL HIGH (ref 65–99)
Potassium: 4.4 mmol/L (ref 3.5–5.2)
Sodium: 141 mmol/L (ref 134–144)

## 2015-08-04 NOTE — Progress Notes (Signed)
BP 130/78 mmHg  Pulse 84  Temp(Src) 97.9 F (36.6 C) (Oral)  Ht 5' 8"  (1.727 m)  Wt 271 lb 9.6 oz (123.197 kg)  BMI 41.31 kg/m2   Subjective:    Patient ID: [name], male    DOB: [birth year], 70 y.o.   MRN: [id number]  HPI: [name] is a 70 y.o. male presenting on 08/04/2015 for Dizziness   HPI Dizziness Patient is coming in today for a recheck on his dizziness. We lowered his blood pressure medications and he has not had any episodes of dizziness since then. He says he also did some maneuvers that he usually does for his vertigo and he feels like it might have finally helped. He's been at least a week without feeling lightheaded or dizzy. He denies any spinning sensation this time. His blood pressures that has been taking at home have been 102/65 on 4/24 128/84 on 4/25 127/80 on 426 129/86 on 4/27 and have been running about there since then as well. Patient denies headaches, blurred vision, chest pains, shortness of breath, or weakness. Denies any side effects from medication and is content with current medication. During his hospitalization. He also had a slight elevation in his creatinine and kidney function. We will recheck those today.  Relevant past medical, surgical, family and social history reviewed and updated as indicated. Interim medical history since our last visit reviewed. Allergies and medications reviewed and updated.  Review of Systems  Constitutional: Negative for fever.  HENT: Negative for ear discharge and ear pain.   Eyes: Negative for discharge and visual disturbance.  Respiratory: Negative for shortness of breath and wheezing.   Cardiovascular: Negative for chest pain and leg swelling.  Gastrointestinal: Negative for abdominal pain, diarrhea and constipation.  Genitourinary: Negative for difficulty urinating.  Musculoskeletal: Negative for back pain and gait problem.  Skin: Negative for rash.  Neurological: Negative for dizziness (Resolved),  syncope, light-headedness and headaches.  All other systems reviewed and are negative.   Per HPI unless specifically indicated above     Medication List       This list is accurate as of: 08/04/15 12:54 PM.  Always use your most recent med list.               allopurinol 300 MG tablet  Commonly known as:  ZYLOPRIM  TAKE ONE TABLET BY MOUTH ONCE DAILY     carvedilol 25 MG tablet  Commonly known as:  COREG  Take 1 tablet (25 mg total) by mouth 2 (two) times daily with a meal.     COZAAR 50 MG tablet  Generic drug:  losartan  Take 50 mg by mouth daily.     meclizine 25 MG tablet  Commonly known as:  ANTIVERT  Take 25 mg by mouth daily.     pantoprazole 40 MG tablet  Commonly known as:  PROTONIX  Take 1 tablet (40 mg total) by mouth 2 (two) times daily.           Objective:    BP 130/78 mmHg  Pulse 84  Temp(Src) 97.9 F (36.6 C) (Oral)  Ht 5' 8"  (1.727 m)  Wt 271 lb 9.6 oz (123.197 kg)  BMI 41.31 kg/m2  Wt Readings from Last 3 Encounters:  08/04/15 271 lb 9.6 oz (123.197 kg)  07/26/15 270 lb (122.471 kg)  07/23/15 278 lb (126.1 kg)    Physical Exam  Constitutional: He is oriented to person, place, and time. He appears well-developed and  well-nourished. No distress.  Eyes: Conjunctivae and EOM are normal. Pupils are equal, round, and reactive to light. Right eye exhibits no discharge. No scleral icterus.  Neck: Neck supple. No thyromegaly present.  Cardiovascular: Normal rate, regular rhythm, normal heart sounds and intact distal pulses.   No murmur heard. Pulmonary/Chest: Effort normal and breath sounds normal. No respiratory distress. He has no wheezes.  Musculoskeletal: Normal range of motion. He exhibits no edema.  Lymphadenopathy:    He has no cervical adenopathy.  Neurological: He is alert and oriented to person, place, and time. Coordination normal.  Skin: Skin is warm and dry. No rash noted. He is not diaphoretic.  Psychiatric: He has a normal mood  and affect. His behavior is normal.  Vitals reviewed.  Results for orders placed or performed during the hospital encounter of 26/33/35  Basic metabolic panel  Result Value Ref Range   Sodium 139 135 - 145 mmol/L   Potassium 3.7 3.5 - 5.1 mmol/L   Chloride 108 101 - 111 mmol/L   CO2 20 (L) 22 - 32 mmol/L   Glucose, Bld 120 (H) 65 - 99 mg/dL   BUN 27 (H) 6 - 20 mg/dL   Creatinine, Ser 2.20 (H) 0.61 - 1.24 mg/dL   Calcium 8.9 8.9 - 10.3 mg/dL   GFR calc non Af Amer 29 (L) >60 mL/min   GFR calc Af Amer 33 (L) >60 mL/min   Anion gap 11 5 - 15  CBC  Result Value Ref Range   WBC 8.4 4.0 - 10.5 K/uL   RBC 4.14 (L) 4.22 - 5.81 MIL/uL   Hemoglobin 12.7 (L) 13.0 - 17.0 g/dL   HCT 37.3 (L) 39.0 - 52.0 %   MCV 90.1 78.0 - 100.0 fL   MCH 30.7 26.0 - 34.0 pg   MCHC 34.0 30.0 - 36.0 g/dL   RDW 14.9 11.5 - 15.5 %   Platelets 258 150 - 400 K/uL  Urinalysis, Routine w reflex microscopic (not at [facility])  Result Value Ref Range   Color, Urine YELLOW YELLOW   APPearance CLEAR CLEAR   Specific Gravity, Urine 1.015 1.005 - 1.030   pH 6.0 5.0 - 8.0   Glucose, UA NEGATIVE NEGATIVE mg/dL   Hgb urine dipstick NEGATIVE NEGATIVE   Bilirubin Urine NEGATIVE NEGATIVE   Ketones, ur NEGATIVE NEGATIVE mg/dL   Protein, ur NEGATIVE NEGATIVE mg/dL   Nitrite NEGATIVE NEGATIVE   Leukocytes, UA NEGATIVE NEGATIVE  Troponin I  Result Value Ref Range   Troponin I <0.03 <0.031 ng/mL      Assessment & Plan:       Problem List Items Addressed This Visit    None    Visit Diagnoses    Dizziness    -  Primary    Relevant Orders    BMP8+EGFR    Acute on chronic renal insufficiency (HCC)        Relevant Orders    BMP8+EGFR        Follow up plan: Return in about 2 months (around 10/04/2015), or if symptoms worsen or fail to improve, for Hypertension recheck.  Counseling provided for all of the vaccine components Orders Placed This Encounter  Procedures  . BMP8+EGFR    Caryl Pina, MD [facility] 08/04/2015, 12:54 PM

## 2015-08-09 ENCOUNTER — Ambulatory Visit: Payer: Medicare Other | Admitting: Family Medicine

## 2015-08-22 ENCOUNTER — Emergency Department (HOSPITAL_COMMUNITY)
Admission: EM | Admit: 2015-08-22 | Discharge: 2015-08-22 | Disposition: A | Discharge status: Home / Self Care | Payer: Medicare Other | Attending: Emergency Medicine | Admitting: Emergency Medicine

## 2015-08-22 ENCOUNTER — Encounter (HOSPITAL_COMMUNITY): Payer: Self-pay | Admitting: Emergency Medicine

## 2015-08-22 DIAGNOSIS — M1990 Unspecified osteoarthritis, unspecified site: Secondary | ICD-10-CM | POA: Insufficient documentation

## 2015-08-22 DIAGNOSIS — I2510 Atherosclerotic heart disease of native coronary artery without angina pectoris: Secondary | ICD-10-CM | POA: Diagnosis not present

## 2015-08-22 DIAGNOSIS — Z79899 Other long term (current) drug therapy: Secondary | ICD-10-CM | POA: Diagnosis not present

## 2015-08-22 DIAGNOSIS — I159 Secondary hypertension, unspecified: Secondary | ICD-10-CM

## 2015-08-22 DIAGNOSIS — I10 Essential (primary) hypertension: Secondary | ICD-10-CM | POA: Diagnosis present

## 2015-08-22 DIAGNOSIS — F17210 Nicotine dependence, cigarettes, uncomplicated: Secondary | ICD-10-CM | POA: Insufficient documentation

## 2015-08-22 DIAGNOSIS — J449 Chronic obstructive pulmonary disease, unspecified: Secondary | ICD-10-CM | POA: Insufficient documentation

## 2015-08-22 DIAGNOSIS — Z8546 Personal history of malignant neoplasm of prostate: Secondary | ICD-10-CM | POA: Diagnosis not present

## 2015-08-22 MED ORDER — AMLODIPINE BESYLATE 10 MG PO TABS: 10.0000 mg | ORAL_TABLET | Freq: Every day | ORAL | Status: DC

## 2015-08-22 MED ORDER — AMLODIPINE BESYLATE 5 MG PO TABS
10.0000 mg | ORAL_TABLET | Freq: Once | ORAL | Status: AC
  Administered 2015-08-22: 10 mg via ORAL
  Filled 2015-08-22: qty 2

## 2015-08-22 NOTE — ED Provider Notes (Signed)
CSN: [id number]     Arrival date & time 08/22/15  1305 History   First MD Initiated Contact with Patient 08/22/15 1351     Chief Complaint  Patient presents with  . Hypertension     (Consider location/radiation/quality/duration/timing/severity/associated sxs/prior Treatment) HPI   [name] is a 70 y.o. male who presents for evaluation of elevated blood pressure. He has been taking his blood pressure at home, and found to be as high as 220/100. He denies headache, weakness, nausea, vomiting, cough, shortness of breath or chest pain. His PCP discontinued his clonidine and Norvasc, about one month ago because of some low blood pressures. He had a subsequent follow-up about a week ago and was doing well. At that time, his creatinine was elevated at 1.6, and his PCP was comfortable with that. Apparently it was showing a trend towards improvement. There are no other known modifying factors.   Past Medical History  Diagnosis Date  . Coronary atherosclerosis of native coronary artery     Mild atherosclerosis at catheterization 2011  . Gout   . GERD (gastroesophageal reflux disease)   . Essential hypertension, benign   . COPD (chronic obstructive pulmonary disease) ([place])   . Tubular adenoma of colon 08/2008  . Prostate cancer ([place])     radiation tx  . Aortic atherosclerosis ([facility])     Penetrating aortic ulcer s/p endograft 2004 - followed by Dr. Trula Slade  . Shortness of breath dyspnea     with exertion  . Arthritis   . History of kidney stones   . Sleep apnea     Intolerant of CPAP  . Neuropathy ([place])     TOES   Past Surgical History  Procedure Laterality Date  . Aortic endograft repair  2004    Dr. [name]  . Colonoscopy w/ polypectomy    . Umbilical hernia repair    . Fracture surgery      Bilateral lower arms as a child  . Knee arthroscopy      Left  . Prostate biopsy  09/26/2011    Procedure: BIOPSY TRANSRECTAL ULTRASONIC PROSTATE (TUBP);  Surgeon: [name], MD;   Location: [facility];  Service: Urology;  Laterality: N/A;     . Circumcision  09/26/2011    Procedure: CIRCUMCISION ADULT;  Surgeon: [name], MD;  Location: [facility];  Service: Urology;  Laterality: N/A;  . Cystoscopy  09/26/2011    Procedure: CYSTOSCOPY;  Surgeon: [name], MD;  Location: [facility];  Service: Urology;  Laterality: N/A;  . Knee surgery  Sept. 26, 2013    Torn minisc.- Right  knee  . Evar    . Endovascular stent insertion  September 13, 2007    EVAR - Aortic stent graft  . Chest tube insertion  1989    "collapsed lung"due to injury  . Partial knee arthroplasty Right 04/26/2015    Procedure: RIGHT KNEE MEDIAL UNICOMPARTMENTAL ARTHROPLASTY;  Surgeon: Gaynelle Arabian, MD;  Location: [facility];  Service: Orthopedics;  Laterality: Right;   Family History  Problem Relation Age of Onset  . Stroke Mother   . Mesothelioma Father   . Cancer Father   . Hyperlipidemia Sister   . Heart attack Sister   . Cancer Brother 66  . Heart disease Brother   . Hyperlipidemia Brother    Social History  Substance Use Topics  . Smoking status: Current Every Day Smoker -- 0.50 packs/day for 42 years    Types: Cigarettes  . Smokeless  tobacco: Never Used     Comment: 12 ciggs per day  . Alcohol Use: No    Review of Systems  All other systems reviewed and are negative.     Allergies  Carbidopa-levodopa; Morphine sulfate; Sulfacetamide sodium; and Sulfamethoxazole-trimethoprim  Home Medications   Prior to Admission medications   Medication Sig Start Date End Date Taking? Authorizing Provider  allopurinol (ZYLOPRIM) 300 MG tablet TAKE ONE TABLET BY MOUTH ONCE DAILY 05/14/15  Yes Eustaquio Maize, MD  carvedilol (COREG) 25 MG tablet Take 1 tablet (25 mg total) by mouth 2 (two) times daily with a meal. 07/26/15  Yes [name] [name], MD  losartan (COZAAR) 50 MG tablet Take 50 mg by mouth daily.   Yes Historical Provider, MD  meclizine (ANTIVERT) 25 MG tablet Take 25 mg by mouth 3 (three) times  daily as needed for dizziness.    Yes Historical Provider, MD  oxybutynin (DITROPAN) 5 MG tablet Take 5 mg by mouth at bedtime.   Yes Historical Provider, MD  pantoprazole (PROTONIX) 40 MG tablet Take 1 tablet (40 mg total) by mouth 2 (two) times daily. 06/07/15  Yes Eustaquio Maize, MD  traMADol (ULTRAM) 50 MG tablet Take 50-100 mg by mouth every 6 (six) hours as needed for moderate pain.   Yes Historical Provider, MD  amLODipine (NORVASC) 10 MG tablet Take 1 tablet (10 mg total) by mouth daily. 08/22/15   Daleen Bo, MD   BP 188/96 mmHg  Pulse 60  Temp(Src) 98 F (36.7 C) (Oral)  Resp 20  Ht 5\' 8"  (1.727 m)  Wt 275 lb (124.739 kg)  BMI 41.82 kg/m2  SpO2 94% Physical Exam  Constitutional: He is oriented to person, place, and time. He appears well-developed and well-nourished. No distress.  HENT:  Head: Normocephalic and atraumatic.  Right Ear: External ear normal.  Left Ear: External ear normal.  Eyes: Conjunctivae and EOM are normal. Pupils are equal, round, and reactive to light.  Neck: Normal range of motion and phonation normal. Neck supple.  Cardiovascular: Normal rate, regular rhythm and normal heart sounds.   Pulmonary/Chest: Effort normal and breath sounds normal. He exhibits no bony tenderness.  Abdominal: Soft. There is no tenderness.  Musculoskeletal: Normal range of motion. He exhibits edema (Mild).  Neurological: He is alert and oriented to person, place, and time. No cranial nerve deficit or sensory deficit. He exhibits normal muscle tone. Coordination normal.  No dysarthria and aphasia or nystagmus.  Skin: Skin is warm, dry and intact.  Psychiatric: He has a normal mood and affect. His behavior is normal. Judgment and thought content normal.  Nursing note and vitals reviewed.   ED Course  Procedures (including critical care time)   Medications  amLODipine (NORVASC) tablet 10 mg (not administered)    Patient Vitals for the past 24 hrs:  BP Temp Temp src Pulse  Resp SpO2 Height Weight  08/22/15 1430 188/96 mmHg - - - - - - -  08/22/15 1401 164/92 mmHg 98 F (36.7 C) Oral 60 20 94 % - -  08/22/15 1400 164/92 mmHg - - 64 - 94 % - -  08/22/15 1308 (!) 178/114 mmHg 98.1 F (36.7 C) Oral 67 20 97 % 5\' 8"  (1.727 m) 275 lb (124.739 kg)    3:10 PM Reevaluation with update and discussion. After initial assessment and treatment, an updated evaluation reveals Patient remains comfortable. Findings discussed with patient and a friend, all questions were answered. Quamesha Mullet L   Labs Review  Labs Reviewed - No data to display  Imaging Review No results found. I have personally reviewed and evaluated these images and lab results as part of my medical decision-making.   EKG Interpretation None      MDM   Final diagnoses:  Secondary hypertension, unspecified    Patient's blood pressures trending up since discontinuation of clonidine and Norvasc. Will restart Norvasc. Doubt ACS, PE, CVA, or metabolic instability.  Nursing Notes Reviewed/ Care Coordinated Applicable Imaging Reviewed Interpretation of Laboratory Data incorporated into ED treatment  The patient appears reasonably screened and/or stabilized for discharge and I doubt any other medical condition or other [facility] requiring further screening, evaluation, or treatment in the ED at this time prior to discharge.  Plan: Home Medications- add Norvasc; Home Treatments- rest; return here if the recommended treatment, does not improve the symptoms; Recommended follow up- PCP 2-3 days for BP check     Daleen Bo, MD 08/22/15 1512

## 2015-08-22 NOTE — Discharge Instructions (Signed)

## 2015-08-22 NOTE — ED Notes (Signed)
Pt states his blood pressure has been elevated since yesterday.  Was taken off of 2 of his medications 2 weeks ago for blood pressure.  States he is a little lightheaded.

## 2015-08-23 ENCOUNTER — Encounter: Payer: Self-pay | Admitting: Family Medicine

## 2015-08-23 ENCOUNTER — Ambulatory Visit (INDEPENDENT_AMBULATORY_CARE_PROVIDER_SITE_OTHER): Payer: Medicare Other | Admitting: Family Medicine

## 2015-08-23 VITALS — BP 168/95 | HR 73 | Temp 98.1°F | Ht 68.0 in | Wt 272.4 lb

## 2015-08-23 DIAGNOSIS — R42 Dizziness and giddiness: Secondary | ICD-10-CM

## 2015-08-23 DIAGNOSIS — I10 Essential (primary) hypertension: Secondary | ICD-10-CM | POA: Diagnosis not present

## 2015-08-23 MED ORDER — AMLODIPINE BESYLATE 10 MG PO TABS: 10.0000 mg | ORAL_TABLET | Freq: Every day | ORAL | Status: DC

## 2015-08-23 NOTE — Progress Notes (Signed)
BP 168/95 mmHg  Pulse 73  Temp(Src) 98.1 F (36.7 C) (Oral)  Ht _0  (1.727 m)  Wt 272 lb 6.4 oz (123.56 kg)  BMI 41.43 kg/m2   Subjective:    Patient ID: [name], male    DOB: 11-30-1945, 70 y.o.   MRN: [id number]  HPI: [name] is a 70 y.o. male presenting on 08/23/2015 for Hypertension   HPI Dizziness Patient is still having dizziness despite his blood pressure being elevated now. He feels like he is off balance but no longer lightheaded. He does not feel like the room is spinning either. He denies any issues with vision or any chest pains or palpitations. Patient went to the emergency department for elevated blood pressure and dizziness when his blood pressure the other day with 220/120. The emergency department put him back on his amlodipine which he was on previously. He had been taken off the amlodipine because he was lightheaded and dizzy and his blood pressure was on the lower end.  Hypertension Patient's blood pressure today is 160/95. He was just restarted on amlodipine 2 days ago. Patient denies headaches, blurred vision, chest pains, shortness of breath, or weakness. Denies any side effects from medication and is content with current medication.   Relevant past medical, surgical, family and social history reviewed and updated as indicated. Interim medical history since our last visit reviewed. Allergies and medications reviewed and updated.  Review of Systems  Constitutional: Negative for fever and chills.  HENT: Negative for ear discharge and ear pain.   Eyes: Negative for discharge and visual disturbance.  Respiratory: Negative for cough, chest tightness, shortness of breath and wheezing.   Cardiovascular: Negative for chest pain and leg swelling.  Gastrointestinal: Negative for abdominal pain, diarrhea and constipation.  Genitourinary: Negative for difficulty urinating.  Musculoskeletal: Negative for back pain and gait problem.  Skin: Negative  for color change and rash.  Neurological: Positive for dizziness. Negative for syncope, weakness, light-headedness, numbness and headaches.  All other systems reviewed and are negative.   Per HPI unless specifically indicated above     Medication List       This list is accurate as of: 08/23/15  5:39 PM.  Always use your most recent med list.               allopurinol 300 MG tablet  Commonly known as:  ZYLOPRIM  TAKE ONE TABLET BY MOUTH ONCE DAILY     amLODipine 10 MG tablet  Commonly known as:  NORVASC  Take 1 tablet (10 mg total) by mouth daily.     carvedilol 25 MG tablet  Commonly known as:  COREG  Take 1 tablet (25 mg total) by mouth 2 (two) times daily with a meal.     COZAAR 50 MG tablet  Generic drug:  losartan  Take 50 mg by mouth daily.     meclizine 25 MG tablet  Commonly known as:  ANTIVERT  Take 25 mg by mouth 3 (three) times daily as needed for dizziness. Reported on 08/23/2015     oxybutynin 5 MG tablet  Commonly known as:  DITROPAN  Take 5 mg by mouth at bedtime.     pantoprazole 40 MG tablet  Commonly known as:  PROTONIX  Take 1 tablet (40 mg total) by mouth 2 (two) times daily.     traMADol 50 MG tablet  Commonly known as:  ULTRAM  Take 50-100 mg by mouth every 6 (six) hours as  needed for moderate pain.           Objective:    BP 168/95 mmHg  Pulse 73  Temp(Src) 98.1 F (36.7 C) (Oral)  Ht _0  (1.727 m)  Wt 272 lb 6.4 oz (123.56 kg)  BMI 41.43 kg/m2  Wt Readings from Last 3 Encounters:  08/23/15 272 lb 6.4 oz (123.56 kg)  08/22/15 275 lb (124.739 kg)  08/04/15 271 lb 9.6 oz (123.197 kg)    Physical Exam  Constitutional: He is oriented to person, place, and time. He appears well-developed and well-nourished. No distress.  HENT:  Right Ear: External ear normal.  Left Ear: External ear normal.  Nose: Nose normal.  Mouth/Throat: Oropharynx is clear and moist. No oropharyngeal exudate.  Eyes: Conjunctivae and EOM are normal.  Pupils are equal, round, and reactive to light. Right eye exhibits no discharge. No scleral icterus.  Neck: Neck supple. No thyromegaly present.  Cardiovascular: Normal rate, regular rhythm, normal heart sounds and intact distal pulses.   No murmur heard. Pulmonary/Chest: Effort normal and breath sounds normal. No respiratory distress. He has no wheezes.  Musculoskeletal: Normal range of motion. He exhibits no edema.  Lymphadenopathy:    He has no cervical adenopathy.  Neurological: He is alert and oriented to person, place, and time. He displays normal reflexes. No cranial nerve deficit. He exhibits normal muscle tone. Coordination normal.  Skin: Skin is warm and dry. No rash noted. He is not diaphoretic.  Psychiatric: He has a normal mood and affect. His behavior is normal.  Nursing note and vitals reviewed.   Results for orders placed or performed in visit on 08/04/15  BMP8+EGFR  Result Value Ref Range   Glucose 101 (H) 65 - 99 mg/dL   BUN 15 8 - 27 mg/dL   Creatinine, Ser 1.61 (H) 0.76 - 1.27 mg/dL   GFR calc non Af Amer 43 (L) >59 mL/min/1.73   GFR calc Af Amer 49 (L) >59 mL/min/1.73   BUN/Creatinine Ratio 9 (L) 10 - 24   Sodium 141 134 - 144 mmol/L   Potassium 4.4 3.5 - 5.2 mmol/L   Chloride 104 96 - 106 mmol/L   CO2 20 18 - 29 mmol/L   Calcium 9.0 8.6 - 10.2 mg/dL      Assessment & Plan:   Problem List Items Addressed This Visit      Cardiovascular and Mediastinum   Essential hypertension, benign - Primary   Relevant Medications   amLODipine (NORVASC) 10 MG tablet    Other Visit Diagnoses    Dizziness        Relevant Orders    Ambulatory referral to ENT        Follow up plan: Return in about 2 weeks (around 09/06/2015), or if symptoms worsen or fail to improve, for Hypertension recheck.  Counseling provided for all of the vaccine components Orders Placed This Encounter  Procedures  . Ambulatory referral to ENT    Caryl Pina, MD [facility] 08/23/2015, 5:39 PM

## 2015-08-31 DIAGNOSIS — M79672 Pain in left foot: Secondary | ICD-10-CM | POA: Diagnosis not present

## 2015-08-31 DIAGNOSIS — M25579 Pain in unspecified ankle and joints of unspecified foot: Secondary | ICD-10-CM | POA: Diagnosis not present

## 2015-08-31 DIAGNOSIS — M79671 Pain in right foot: Secondary | ICD-10-CM | POA: Diagnosis not present

## 2015-09-08 ENCOUNTER — Ambulatory Visit (INDEPENDENT_AMBULATORY_CARE_PROVIDER_SITE_OTHER): Payer: Medicare Other | Admitting: Family Medicine

## 2015-09-08 ENCOUNTER — Encounter: Payer: Self-pay | Admitting: Family Medicine

## 2015-09-08 VITALS — BP 137/88 | HR 69 | Temp 98.1°F | Ht 68.0 in | Wt 274.8 lb

## 2015-09-08 DIAGNOSIS — I10 Essential (primary) hypertension: Secondary | ICD-10-CM | POA: Diagnosis not present

## 2015-09-08 DIAGNOSIS — J410 Simple chronic bronchitis: Secondary | ICD-10-CM | POA: Diagnosis not present

## 2015-09-08 DIAGNOSIS — G629 Polyneuropathy, unspecified: Secondary | ICD-10-CM | POA: Diagnosis not present

## 2015-09-08 MED ORDER — FLUTICASONE PROPIONATE 50 MCG/ACT NA SUSP
1.0000 | Freq: Two times a day (BID) | NASAL | Status: DC | PRN
Start: 2015-09-08 — End: 2016-02-04

## 2015-09-08 MED ORDER — AMITRIPTYLINE HCL 25 MG PO TABS: 25.0000 mg | ORAL_TABLET | Freq: Every day | ORAL | Status: DC

## 2015-09-08 MED ORDER — ALBUTEROL SULFATE HFA 108 (90 BASE) MCG/ACT IN AERS: 2.0000 | INHALATION_SPRAY | Freq: Four times a day (QID) | RESPIRATORY_TRACT | Status: DC | PRN

## 2015-09-08 NOTE — Progress Notes (Signed)
BP 137/88 mmHg  Pulse 69  Temp(Src) 98.1 F (36.7 C) (Oral)  Ht 5\' 8"  (1.727 m)  Wt 274 lb 12.8 oz (124.648 kg)  BMI 41.79 kg/m2   Subjective:    Patient ID: [name], male    DOB: 1945-11-22, 70 y.o.   MRN: [id number]  HPI: INESH Hanson is a 70 y.o. male presenting on 09/08/2015 for Hypertension   HPI Hypertension Patient is taking Norvasc and Coreg and Cozaar. His blood pressure today is 137/88. Patient denies headaches, blurred vision, chest pains, shortness of breath, or weakness. Denies any side effects from medication and is content with current medication.   Neuropathy Patient has been having burning and tingling in bilateral lower extremities. He has seen a neurologist and his oncologist and they said that he likely has neuropathy due to radiation therapy that affected his lower extremities. He is coming in today because he would like to try something besides the gabapentin that he has been on to try and help with this burning and tingling sensation in his feet below about mid calf. He denies any overlying skin changes or significant swelling or ulcerations or breakdown of the skin in either of his legs or feet.  Coughing and congestion Patient has been having coughing and congestion that is worse at night. The cough is nonproductive. He does admit that he is a current every day smoker and is down to half a pack per day but has been up to a pack and a half a day and has smoked for about 42 years. He denies any fevers or chills or shortness of breath or wheezing. He denies any chest pain  Relevant past medical, surgical, family and social history reviewed and updated as indicated. Interim medical history since our last visit reviewed. Allergies and medications reviewed and updated.  Review of Systems  Constitutional: Negative for fever.  HENT: Positive for congestion. Negative for ear discharge, ear pain, rhinorrhea, sinus pressure, sneezing and sore throat.     Eyes: Negative for discharge and visual disturbance.  Respiratory: Positive for cough. Negative for chest tightness, shortness of breath and wheezing.   Cardiovascular: Negative for chest pain and leg swelling.  Gastrointestinal: Negative for abdominal pain, diarrhea and constipation.  Genitourinary: Negative for difficulty urinating.  Musculoskeletal: Negative for back pain and gait problem.  Skin: Negative for rash.  Neurological: Positive for numbness. Negative for dizziness, syncope, weakness, light-headedness and headaches.  All other systems reviewed and are negative.   Per HPI unless specifically indicated above     Medication List       This list is accurate as of: 09/08/15 12:58 PM.  Always use your most recent med list.               albuterol 108 (90 Base) MCG/ACT inhaler  Commonly known as:  PROVENTIL HFA;VENTOLIN HFA  Inhale 2 puffs into the lungs every 6 (six) hours as needed for wheezing or shortness of breath.     allopurinol 300 MG tablet  Commonly known as:  ZYLOPRIM  TAKE ONE TABLET BY MOUTH ONCE DAILY     amitriptyline 25 MG tablet  Commonly known as:  ELAVIL  Take 1 tablet (25 mg total) by mouth at bedtime.     amLODipine 10 MG tablet  Commonly known as:  NORVASC  Take 1 tablet (10 mg total) by mouth daily.     carvedilol 25 MG tablet  Commonly known as:  COREG  Take 1 tablet (  25 mg total) by mouth 2 (two) times daily with a meal.     COZAAR 50 MG tablet  Generic drug:  losartan  Take 50 mg by mouth daily.     fluticasone 50 MCG/ACT nasal spray  Commonly known as:  FLONASE  Place 1 spray into both nostrils 2 (two) times daily as needed for allergies or rhinitis.     oxybutynin 5 MG tablet  Commonly known as:  DITROPAN  Take 5 mg by mouth at bedtime.     pantoprazole 40 MG tablet  Commonly known as:  PROTONIX  Take 1 tablet (40 mg total) by mouth 2 (two) times daily.     traMADol 50 MG tablet  Commonly known as:  ULTRAM  Take 50-100 mg  by mouth every 6 (six) hours as needed for moderate pain.           Objective:    BP 137/88 mmHg  Pulse 69  Temp(Src) 98.1 F (36.7 C) (Oral)  Ht 5\' 8"  (1.727 m)  Wt 274 lb 12.8 oz (124.648 kg)  BMI 41.79 kg/m2  Wt Readings from Last 3 Encounters:  09/08/15 274 lb 12.8 oz (124.648 kg)  08/23/15 272 lb 6.4 oz (123.56 kg)  08/22/15 275 lb (124.739 kg)    Physical Exam  Constitutional: He is oriented to person, place, and time. He appears well-developed and well-nourished. No distress.  HENT:  Right Ear: External ear normal.  Left Ear: External ear normal.  Nose: Nose normal.  Mouth/Throat: Posterior oropharyngeal edema present. No oropharyngeal exudate or posterior oropharyngeal erythema.  Eyes: Conjunctivae and EOM are normal. Pupils are equal, round, and reactive to light. Right eye exhibits no discharge. No scleral icterus.  Neck: Neck supple. No thyromegaly present.  Cardiovascular: Normal rate, regular rhythm, normal heart sounds and intact distal pulses.   No murmur heard. Pulmonary/Chest: Effort normal and breath sounds normal. No respiratory distress. He has no wheezes. He has no rales. He exhibits no tenderness.  Musculoskeletal: Normal range of motion. He exhibits no edema.  Lymphadenopathy:    He has no cervical adenopathy.  Neurological: He is alert and oriented to person, place, and time. Coordination normal.  Skin: Skin is warm and dry. No rash noted. He is not diaphoretic.  Psychiatric: He has a normal mood and affect. His behavior is normal.  Nursing note and vitals reviewed.     Assessment & Plan:   Problem List Items Addressed This Visit      Cardiovascular and Mediastinum   Essential hypertension, benign - Primary    Controlled continue current medication.        Respiratory   COPD (chronic obstructive pulmonary disease) (HCC)    Having congestion and postnasal drainage and coughing that's worse at night. Will try albuterol and Flonase and see if  it improves.      Relevant Medications   fluticasone (FLONASE) 50 MCG/ACT nasal spray   albuterol (PROVENTIL HFA;VENTOLIN HFA) 108 (90 Base) MCG/ACT inhaler     Nervous and Auditory   Neuropathy (HCC)    Did not like gabapentin and would like to try something else. We will start amitriptyline 25 at bedtime      Relevant Medications   amitriptyline (ELAVIL) 25 MG tablet       Follow up plan: Return in about 3 months (around 12/09/2015), or if symptoms worsen or fail to improve, for Recheck hypertension and neuropathy and COPD.  Counseling provided for all of the vaccine components No orders of  the defined types were placed in this encounter.    Caryl Pina, MD [facility] 09/08/2015, 12:58 PM

## 2015-09-08 NOTE — Assessment & Plan Note (Signed)
Having congestion and postnasal drainage and coughing that's worse at night. Will try albuterol and Flonase and see if it improves.

## 2015-09-08 NOTE — Assessment & Plan Note (Signed)
Did not like gabapentin and would like to try something else. We will start amitriptyline 25 at bedtime

## 2015-09-08 NOTE — Assessment & Plan Note (Signed)
Controlled  - continue current medication 

## 2015-09-09 ENCOUNTER — Other Ambulatory Visit: Payer: Self-pay | Admitting: Family Medicine

## 2015-09-23 ENCOUNTER — Other Ambulatory Visit: Payer: Self-pay | Admitting: Pediatrics

## 2015-09-27 DIAGNOSIS — C61 Malignant neoplasm of prostate: Secondary | ICD-10-CM | POA: Diagnosis not present

## 2015-10-01 ENCOUNTER — Other Ambulatory Visit (HOSPITAL_COMMUNITY)
Admission: RE | Admit: 2015-10-01 | Discharge: 2015-10-01 | Disposition: A | Discharge status: Home / Self Care | Payer: Medicare Other | Source: Other Acute Inpatient Hospital | Attending: Urology | Admitting: Urology

## 2015-10-01 ENCOUNTER — Ambulatory Visit (INDEPENDENT_AMBULATORY_CARE_PROVIDER_SITE_OTHER): Payer: Medicare Other | Admitting: Urology

## 2015-10-01 DIAGNOSIS — N3941 Urge incontinence: Secondary | ICD-10-CM | POA: Diagnosis not present

## 2015-10-01 DIAGNOSIS — B372 Candidiasis of skin and nail: Secondary | ICD-10-CM

## 2015-10-01 DIAGNOSIS — R351 Nocturia: Secondary | ICD-10-CM | POA: Diagnosis not present

## 2015-10-01 DIAGNOSIS — C61 Malignant neoplasm of prostate: Secondary | ICD-10-CM | POA: Diagnosis not present

## 2015-10-01 LAB — URINALYSIS, ROUTINE W REFLEX MICROSCOPIC
Bilirubin Urine: NEGATIVE
Glucose, UA: NEGATIVE mg/dL
Hgb urine dipstick: NEGATIVE
Ketones, ur: NEGATIVE mg/dL
Leukocytes, UA: NEGATIVE
Nitrite: NEGATIVE
Protein, ur: 30 mg/dL — AB
Specific Gravity, Urine: 1.015 (ref 1.005–1.030)
pH: 6 (ref 5.0–8.0)

## 2015-10-01 LAB — URINE MICROSCOPIC-ADD ON

## 2015-10-11 ENCOUNTER — Ambulatory Visit: Payer: Medicare Other | Admitting: Family Medicine

## 2015-10-12 DIAGNOSIS — Z471 Aftercare following joint replacement surgery: Secondary | ICD-10-CM | POA: Diagnosis not present

## 2015-10-12 DIAGNOSIS — Z96652 Presence of left artificial knee joint: Secondary | ICD-10-CM | POA: Diagnosis not present

## 2015-10-25 ENCOUNTER — Other Ambulatory Visit: Payer: Self-pay | Admitting: Family Medicine

## 2015-11-17 ENCOUNTER — Ambulatory Visit (INDEPENDENT_AMBULATORY_CARE_PROVIDER_SITE_OTHER): Payer: Medicare Other | Admitting: Family Medicine

## 2015-11-17 ENCOUNTER — Encounter: Payer: Self-pay | Admitting: Family Medicine

## 2015-11-17 VITALS — BP 125/83 | HR 78 | Temp 97.3°F | Ht 68.0 in | Wt 281.4 lb

## 2015-11-17 DIAGNOSIS — R109 Unspecified abdominal pain: Secondary | ICD-10-CM | POA: Diagnosis not present

## 2015-11-17 LAB — MICROSCOPIC EXAMINATION
Bacteria, UA: NONE SEEN
Epithelial Cells (non renal): >10 /hpf — AB (ref 0–10)

## 2015-11-17 LAB — URINALYSIS, COMPLETE
Bilirubin, UA: NEGATIVE
Glucose, UA: NEGATIVE
Leukocytes, UA: NEGATIVE
Nitrite, UA: NEGATIVE
RBC, UA: NEGATIVE
Specific Gravity, UA: 1.015 (ref 1.005–1.030)
Urobilinogen, Ur: 0.2 mg/dL (ref 0.2–1.0)
pH, UA: 5.5 (ref 5.0–7.5)

## 2015-11-17 NOTE — Progress Notes (Signed)
BP 125/83 (BP Location: Right Arm, Patient Position: Sitting, Cuff Size: Large)   Pulse 78   Temp 97.3 F (36.3 C) (Oral)   Ht 5\' 8"  (1.727 m)   Wt 281 lb 6.4 oz (127.6 kg)   BMI 42.79 kg/m    Subjective:    Patient ID: [name], male    DOB: [birth year], 70 y.o.   MRN: [id number]  HPI: [name] is a 70 y.o. male presenting on 11/17/2015 for Back Pain (right lower side, began about 2 weeks ago)   HPI Right lower back pain/flank pain Patient has been having right lower back pain/flank pain that has been increasing over the past couple months. He denies any fevers or chills or hematuria or dysuria. He is concerned because of the flank pain and kidneys. He has had stable renal stone inside of the renal pole but none outside. He has also had family history of renal cancer in both his brother and his father and that is what is most concerning him and he wants to get checked out. He denies any issues with the stool or diarrhea or constipation. He denies any nausea or vomiting.  Relevant past medical, surgical, family and social history reviewed and updated as indicated. Interim medical history since our last visit reviewed. Allergies and medications reviewed and updated.  Review of Systems  Constitutional: Negative for fever.  HENT: Negative for ear discharge and ear pain.   Eyes: Negative for discharge and visual disturbance.  Respiratory: Negative for shortness of breath and wheezing.   Cardiovascular: Negative for chest pain and leg swelling.  Gastrointestinal: Negative for abdominal pain, constipation, diarrhea, nausea and vomiting.  Genitourinary: Positive for flank pain. Negative for decreased urine volume, difficulty urinating, dysuria, frequency, hematuria and urgency.  Musculoskeletal: Negative for back pain and gait problem.  Skin: Negative for rash.  Neurological: Negative for syncope, light-headedness and headaches.  All other systems reviewed and are  negative.   Per HPI unless specifically indicated above     Medication List       Accurate as of 11/17/15  3:18 PM. Always use your most recent med list.          albuterol 108 (90 Base) MCG/ACT inhaler Commonly known as:  PROVENTIL HFA;VENTOLIN HFA Inhale 2 puffs into the lungs every 6 (six) hours as needed for wheezing or shortness of breath.   allopurinol 300 MG tablet Commonly known as:  ZYLOPRIM TAKE ONE TABLET BY MOUTH ONCE DAILY   amitriptyline 25 MG tablet Commonly known as:  ELAVIL Take 1 tablet (25 mg total) by mouth at bedtime.   amLODipine 10 MG tablet Commonly known as:  NORVASC Take 1 tablet (10 mg total) by mouth daily.   carvedilol 25 MG tablet Commonly known as:  COREG Take 1 tablet (25 mg total) by mouth 2 (two) times daily with a meal.   fluticasone 50 MCG/ACT nasal spray Commonly known as:  FLONASE Place 1 spray into both nostrils 2 (two) times daily as needed for allergies or rhinitis.   losartan 50 MG tablet Commonly known as:  COZAAR TAKE ONE TABLET BY MOUTH ONCE DAILY   oxybutynin 5 MG tablet Commonly known as:  DITROPAN Take 5 mg by mouth at bedtime.   pantoprazole 40 MG tablet Commonly known as:  PROTONIX Take 1 tablet (40 mg total) by mouth 2 (two) times daily.   traMADol 50 MG tablet Commonly known as:  ULTRAM Take 50-100 mg by mouth every 6 (  six) hours as needed for moderate pain.          Objective:    BP 125/83 (BP Location: Right Arm, Patient Position: Sitting, Cuff Size: Large)   Pulse 78   Temp 97.3 F (36.3 C) (Oral)   Ht 5\' 8"  (1.727 m)   Wt 281 lb 6.4 oz (127.6 kg)   BMI 42.79 kg/m   Wt Readings from Last 3 Encounters:  11/17/15 281 lb 6.4 oz (127.6 kg)  09/08/15 274 lb 12.8 oz (124.6 kg)  08/23/15 272 lb 6.4 oz (123.6 kg)    Physical Exam  Constitutional: He is oriented to person, place, and time. He appears well-developed and well-nourished. No distress.  Eyes: Conjunctivae and EOM are normal. Pupils are  equal, round, and reactive to light. Right eye exhibits no discharge. No scleral icterus.  Neck: Neck supple. No thyromegaly present.  Cardiovascular: Normal rate, regular rhythm, normal heart sounds and intact distal pulses.   No murmur heard. Pulmonary/Chest: Effort normal and breath sounds normal. No respiratory distress. He has no wheezes.  Abdominal: Soft. Bowel sounds are normal. He exhibits no distension. There is no hepatosplenomegaly. There is no tenderness. There is CVA tenderness (Right lower). There is no rebound and no guarding.  Musculoskeletal: Normal range of motion. He exhibits no edema.  Lymphadenopathy:    He has no cervical adenopathy.  Neurological: He is alert and oriented to person, place, and time. Coordination normal.  Skin: Skin is warm and dry. No rash noted. He is not diaphoretic.  Psychiatric: He has a normal mood and affect. His behavior is normal.  Nursing note and vitals reviewed.     Assessment & Plan:   Problem List Items Addressed This Visit    None    Visit Diagnoses    Right flank pain    -  Primary   Patient has had right flank pain for the past couple days. We'll test urinalysis, if negative will do CT abdomen, family history of renal cancer   Relevant Orders   Urinalysis, Complete   Urine culture       Follow up plan: Return if symptoms worsen or fail to improve.  Counseling provided for all of the vaccine components Orders Placed This Encounter  Procedures  . Urine culture  . Urinalysis, Complete    Caryl Pina, MD [facility] 11/17/2015, 3:18 PM

## 2015-11-18 ENCOUNTER — Telehealth: Payer: Self-pay | Admitting: *Deleted

## 2015-11-18 ENCOUNTER — Other Ambulatory Visit: Payer: Self-pay | Admitting: *Deleted

## 2015-11-18 DIAGNOSIS — R109 Unspecified abdominal pain: Secondary | ICD-10-CM

## 2015-11-18 MED ORDER — AMITRIPTYLINE HCL 50 MG PO TABS: 50.0000 mg | ORAL_TABLET | Freq: Every day | ORAL | 2 refills | Status: DC

## 2015-11-18 NOTE — Telephone Encounter (Signed)
Rx sent to pharmacy   

## 2015-11-18 NOTE — Telephone Encounter (Signed)
Patient states that you had mentioned increasing amitriptyline to 50mg  and he is almost out of the 25mg  please advise

## 2015-11-18 NOTE — Telephone Encounter (Signed)
Yes sorry I must of forgot to send it but go ahead and send amitriptyline 50 mg and give him 3 months worth

## 2015-11-23 LAB — URINE CULTURE

## 2015-11-24 MED ORDER — CIPROFLOXACIN HCL 250 MG PO TABS: 250.0000 mg | ORAL_TABLET | Freq: Two times a day (BID) | ORAL | 0 refills | Status: DC

## 2015-11-24 NOTE — Addendum Note (Signed)
Addended by: Nigel Berthold C on: 11/24/2015 10:30 AM   Modules accepted: Orders

## 2015-11-28 ENCOUNTER — Other Ambulatory Visit: Payer: Self-pay | Admitting: Family Medicine

## 2015-11-28 ENCOUNTER — Other Ambulatory Visit: Payer: Self-pay | Admitting: Adult Health

## 2015-11-28 DIAGNOSIS — G629 Polyneuropathy, unspecified: Secondary | ICD-10-CM

## 2015-11-29 ENCOUNTER — Other Ambulatory Visit: Payer: Self-pay

## 2015-11-29 MED ORDER — CARVEDILOL 25 MG PO TABS: 25.0000 mg | ORAL_TABLET | Freq: Two times a day (BID) | ORAL | 3 refills | Status: DC

## 2015-12-01 ENCOUNTER — Ambulatory Visit (HOSPITAL_COMMUNITY)
Admission: RE | Admit: 2015-12-01 | Discharge: 2015-12-01 | Disposition: A | Discharge status: Home / Self Care | Payer: Medicare Other | Source: Ambulatory Visit | Attending: Family Medicine | Admitting: Family Medicine

## 2015-12-01 DIAGNOSIS — N200 Calculus of kidney: Secondary | ICD-10-CM | POA: Insufficient documentation

## 2015-12-01 DIAGNOSIS — R109 Unspecified abdominal pain: Secondary | ICD-10-CM | POA: Diagnosis not present

## 2015-12-01 LAB — POCT I-STAT CREATININE: Creatinine, Ser: 1.7 mg/dL — ABNORMAL HIGH (ref 0.61–1.24)

## 2015-12-01 MED ORDER — IOPAMIDOL (ISOVUE-300) INJECTION 61%
80.0000 mL | Freq: Once | INTRAVENOUS | Status: AC | PRN
  Administered 2015-12-01: 80 mL via INTRAVENOUS

## 2015-12-08 ENCOUNTER — Ambulatory Visit (INDEPENDENT_AMBULATORY_CARE_PROVIDER_SITE_OTHER): Payer: Medicare Other | Admitting: Family Medicine

## 2015-12-08 ENCOUNTER — Encounter: Payer: Self-pay | Admitting: Family Medicine

## 2015-12-08 VITALS — BP 134/88 | HR 74 | Temp 98.2°F | Ht 68.0 in | Wt 283.0 lb

## 2015-12-08 DIAGNOSIS — L209 Atopic dermatitis, unspecified: Secondary | ICD-10-CM

## 2015-12-08 MED ORDER — PREDNISONE 20 MG PO TABS: ORAL_TABLET | ORAL | 0 refills | Status: DC

## 2015-12-08 NOTE — Progress Notes (Signed)
BP 134/88   Pulse 74   Temp 98.2 F (36.8 C) (Oral)   Ht 5\' 8"  (1.727 m)   Wt 283 lb (128.4 kg)   BMI 43.03 kg/m    Subjective:    Patient ID: [name], male    DOB: [birth year], 70 y.o.   MRN: [id number]  HPI: [name] is a 70 y.o. male presenting on 12/08/2015 for Rash (itchy rash all over body, began last week)   HPI Rash Patient is coming in today for a rash that has started over the past 5 days. He denies any fevers or chills or drainage hernia site. The rashes spread over his back his torso his arms his legs the dorsum of his hands and his ankles but not on the palmar surfaces of his hands or plantar surface of feet. He did have tramadol for his kidney stone which was a new medication but no other new medications and he has not been taking the tramadol for at least 5 days and the rash has persisted. He denies any new exposures to foods or environmental. The rash is very pruritic  Relevant past medical, surgical, family and social history reviewed and updated as indicated. Interim medical history since our last visit reviewed. Allergies and medications reviewed and updated.  Review of Systems  Constitutional: Negative for chills and fever.  Eyes: Negative for discharge.  Respiratory: Negative for shortness of breath and wheezing.   Cardiovascular: Negative for chest pain and leg swelling.  Musculoskeletal: Negative for back pain and gait problem.  Skin: Positive for rash.  All other systems reviewed and are negative.   Per HPI unless specifically indicated above     Medication List       Accurate as of 12/08/15  1:06 PM. Always use your most recent med list.          albuterol 108 (90 Base) MCG/ACT inhaler Commonly known as:  PROVENTIL HFA;VENTOLIN HFA Inhale 2 puffs into the lungs every 6 (six) hours as needed for wheezing or shortness of breath.   allopurinol 300 MG tablet Commonly known as:  ZYLOPRIM TAKE ONE TABLET BY MOUTH ONCE DAILY     amitriptyline 50 MG tablet Commonly known as:  ELAVIL Take 1 tablet (50 mg total) by mouth at bedtime.   amitriptyline 25 MG tablet Commonly known as:  ELAVIL TAKE ONE TABLET BY MOUTH AT BEDTIME   amLODipine 10 MG tablet Commonly known as:  NORVASC Take 1 tablet (10 mg total) by mouth daily.   fluticasone 50 MCG/ACT nasal spray Commonly known as:  FLONASE Place 1 spray into both nostrils 2 (two) times daily as needed for allergies or rhinitis.   losartan 50 MG tablet Commonly known as:  COZAAR TAKE ONE TABLET BY MOUTH ONCE DAILY   oxybutynin 5 MG tablet Commonly known as:  DITROPAN Take 5 mg by mouth at bedtime.   pantoprazole 40 MG tablet Commonly known as:  PROTONIX Take 1 tablet (40 mg total) by mouth 2 (two) times daily.   predniSONE 20 MG tablet Commonly known as:  DELTASONE 2 po at same time daily for 5 days   traMADol 50 MG tablet Commonly known as:  ULTRAM Take 50-100 mg by mouth every 6 (six) hours as needed for moderate pain.          Objective:    BP 134/88   Pulse 74   Temp 98.2 F (36.8 C) (Oral)   Ht 5\' 8"  (1.727 m)  Wt 283 lb (128.4 kg)   BMI 43.03 kg/m   Wt Readings from Last 3 Encounters:  12/08/15 283 lb (128.4 kg)  11/17/15 281 lb 6.4 oz (127.6 kg)  09/08/15 274 lb 12.8 oz (124.6 kg)    Physical Exam  Constitutional: He is oriented to person, place, and time. He appears well-developed and well-nourished. No distress.  Eyes: Conjunctivae are normal. Right eye exhibits no discharge. No scleral icterus.  Cardiovascular: Normal rate, regular rhythm, normal heart sounds and intact distal pulses.   No murmur heard. Pulmonary/Chest: Effort normal and breath sounds normal. No respiratory distress. He has no wheezes.  Musculoskeletal: Normal range of motion. He exhibits no edema.  Neurological: He is alert and oriented to person, place, and time. Coordination normal.  Skin: Skin is warm and dry. Rash noted. Rash is papular (Pink papular rash  spread over her torso and worse in the warm areas of the belt line and also spread over both arms and legs. No drainage or erythema or warmth. Consistent with an atopic reaction). He is not diaphoretic.  Psychiatric: He has a normal mood and affect. His behavior is normal.  Nursing note and vitals reviewed.     Assessment & Plan:   Problem List Items Addressed This Visit    None    Visit Diagnoses    Atopic dermatitis    -  Primary   Concern for reaction of delayed type to something. Possible drug reaction, will give prednisone and see if it goes away but if not we may have to change meds   Relevant Medications   predniSONE (DELTASONE) 20 MG tablet       Follow up plan: Return if symptoms worsen or fail to improve.  Counseling provided for all of the vaccine components No orders of the defined types were placed in this encounter.   Caryl Pina, MD [facility] 12/08/2015, 1:06 PM

## 2015-12-09 ENCOUNTER — Ambulatory Visit: Payer: Medicare Other | Admitting: Family Medicine

## 2015-12-15 ENCOUNTER — Telehealth: Payer: Self-pay | Admitting: Family Medicine

## 2015-12-15 MED ORDER — PREDNISONE 20 MG PO TABS: ORAL_TABLET | ORAL | 0 refills | Status: DC

## 2015-12-15 NOTE — Telephone Encounter (Signed)
Pt notified of RX Will call to schedule appt if sxs persist or worsen

## 2015-12-15 NOTE — Telephone Encounter (Signed)
Per pt rash is spreading with worsening itch Will you call in something stronger

## 2015-12-20 DIAGNOSIS — L410 Pityriasis lichenoides et varioliformis acuta: Secondary | ICD-10-CM | POA: Diagnosis not present

## 2015-12-20 DIAGNOSIS — C61 Malignant neoplasm of prostate: Secondary | ICD-10-CM | POA: Diagnosis not present

## 2015-12-20 DIAGNOSIS — L570 Actinic keratosis: Secondary | ICD-10-CM | POA: Diagnosis not present

## 2015-12-20 DIAGNOSIS — X32XXXA Exposure to sunlight, initial encounter: Secondary | ICD-10-CM | POA: Diagnosis not present

## 2015-12-22 ENCOUNTER — Other Ambulatory Visit: Payer: Self-pay | Admitting: Family Medicine

## 2015-12-27 DIAGNOSIS — L84 Corns and callosities: Secondary | ICD-10-CM | POA: Diagnosis not present

## 2015-12-27 DIAGNOSIS — X32XXXD Exposure to sunlight, subsequent encounter: Secondary | ICD-10-CM | POA: Diagnosis not present

## 2015-12-27 DIAGNOSIS — L570 Actinic keratosis: Secondary | ICD-10-CM | POA: Diagnosis not present

## 2015-12-31 ENCOUNTER — Ambulatory Visit (INDEPENDENT_AMBULATORY_CARE_PROVIDER_SITE_OTHER): Payer: Medicare Other | Admitting: Urology

## 2015-12-31 DIAGNOSIS — N200 Calculus of kidney: Secondary | ICD-10-CM

## 2015-12-31 DIAGNOSIS — C61 Malignant neoplasm of prostate: Secondary | ICD-10-CM | POA: Diagnosis not present

## 2015-12-31 DIAGNOSIS — N3281 Overactive bladder: Secondary | ICD-10-CM | POA: Diagnosis not present

## 2016-01-04 DIAGNOSIS — Z96651 Presence of right artificial knee joint: Secondary | ICD-10-CM | POA: Diagnosis not present

## 2016-01-04 DIAGNOSIS — Z471 Aftercare following joint replacement surgery: Secondary | ICD-10-CM | POA: Diagnosis not present

## 2016-01-10 DIAGNOSIS — L308 Other specified dermatitis: Secondary | ICD-10-CM | POA: Diagnosis not present

## 2016-01-10 DIAGNOSIS — L304 Erythema intertrigo: Secondary | ICD-10-CM | POA: Diagnosis not present

## 2016-01-17 DIAGNOSIS — L308 Other specified dermatitis: Secondary | ICD-10-CM | POA: Diagnosis not present

## 2016-02-04 ENCOUNTER — Ambulatory Visit (INDEPENDENT_AMBULATORY_CARE_PROVIDER_SITE_OTHER): Payer: Medicare Other | Admitting: Nurse Practitioner

## 2016-02-04 ENCOUNTER — Encounter: Payer: Self-pay | Admitting: Nurse Practitioner

## 2016-02-04 VITALS — BP 153/97 | HR 67 | Temp 96.7°F

## 2016-02-04 DIAGNOSIS — J441 Chronic obstructive pulmonary disease with (acute) exacerbation: Secondary | ICD-10-CM | POA: Diagnosis not present

## 2016-02-04 DIAGNOSIS — J411 Mucopurulent chronic bronchitis: Secondary | ICD-10-CM | POA: Diagnosis not present

## 2016-02-04 MED ORDER — FLUTICASONE-SALMETEROL 100-50 MCG/DOSE IN AEPB: 1.0000 | INHALATION_SPRAY | Freq: Two times a day (BID) | RESPIRATORY_TRACT | 3 refills | Status: DC

## 2016-02-04 MED ORDER — BENZONATATE 100 MG PO CAPS: 200.0000 mg | ORAL_CAPSULE | Freq: Three times a day (TID) | ORAL | 0 refills | Status: DC | PRN

## 2016-02-04 MED ORDER — METHYLPREDNISOLONE ACETATE 80 MG/ML IJ SUSP
80.0000 mg | Freq: Once | INTRAMUSCULAR | Status: AC
  Administered 2016-02-04: 80 mg via INTRAMUSCULAR

## 2016-02-04 NOTE — Progress Notes (Signed)
Subjective:     [name] is a 70 y.o. male here for evaluation of a cough. Onset of symptoms was 1 month ago. Symptoms have been unchanged since that time. The cough is dry, harsh, hoarse and nonproductive and is aggravated by nothing. Associated symptoms include: change in voice. Patient does not have a history of asthma. Patient does have a history of environmental allergens. Patient has not traveled recently. Patient does have a history of smoking. Patient has had a previous chest x-ray. Patient has had a PPD done.  The following portions of the patient's history were reviewed and updated as appropriate: allergies, current medications, past family history, past medical history, past social history, past surgical history and problem list.  Review of Systems Pertinent items noted in HPI and remainder of comprehensive ROS otherwise negative.    Objective:     BP (!) 153/97   Pulse 67   Temp (!) 96.7 F (35.9 C) (Oral)  General appearance: alert and cooperative Eyes: conjunctivae/corneas clear. PERRL, EOM's intact. Fundi benign. Ears: normal TM's and external ear canals both ears Nose: Nares normal. Septum midline. Mucosa normal. No drainage or sinus tenderness., oral mucosa dry Throat: lips, mucosa, and tongue normal; teeth and gums normal Lungs: wheezes bibasilar and on expiration. Dry barky cough Heart: regular rate and rhythm, S1, S2 normal, no murmur, click, rub or gallop    Assessment:    COPD with exacerbation    Plan:  1. Mucopurulent chronic bronchitis ([place])  2. COPD exacerbation ([place]) Rinse mouth after using advair RTO in 2 weeks for PFT - methylPREDNISolone acetate (DEPO-MEDROL) injection 80 mg; Inject 1 mL (80 mg total) into the muscle once. - benzonatate (TESSALON PERLES) 100 MG capsule; Take 2 capsules (200 mg total) by mouth 3 (three) times daily as needed for cough.  Dispense: 20 capsule; Refill: 0 - Fluticasone-Salmeterol (ADVAIR) 100-50 MCG/DOSE AEPB;  Inhale 1 puff into the lungs 2 (two) times daily.  Dispense: 1 each; Refill: [place], FNP

## 2016-02-04 NOTE — Patient Instructions (Signed)
Chronic Obstructive Pulmonary Disease °Chronic obstructive pulmonary disease (COPD) is a common lung problem. In COPD, the flow of air from the lungs is limited. The way your lungs work will probably never return to normal, but there are things you can do to improve your lungs and make yourself feel better. Your doctor may treat your condition with: °· Medicines. °· Oxygen. °· Lung surgery. °· Changes to your diet. °· Rehabilitation. This may involve a team of specialists. °HOME CARE °· Take all medicines as told by your doctor. °· Avoid medicines or cough syrups that dry up your airway (such as antihistamines) and do not allow you to get rid of thick spit. You do not need to avoid them if told differently by your doctor. °· If you smoke, stop. Smoking makes the problem worse. °· Avoid being around things that make your breathing worse (like smoke, chemicals, and fumes). °· Use oxygen therapy and therapy to help improve your lungs (pulmonary rehabilitation) if told by your doctor. If you need home oxygen therapy, ask your doctor if you should buy a tool to measure your oxygen level (oximeter). °· Avoid people who have a sickness you can catch (contagious). °· Avoid going outside when it is very hot, cold, or humid. °· Eat healthy foods. Eat smaller meals more often. Rest before meals. °· Stay active, but remember to also rest. °· Make sure to get all the shots (vaccines) your doctor recommends. Ask your doctor if you need a pneumonia shot. °· Learn and use tips on how to relax. °· Learn and use tips on how to control your breathing as told by your doctor. Try: °¨ Breathing in (inhaling) through your nose for 1 second. Then, pucker your lips and breath out (exhale) through your lips for 2 seconds. °¨ Putting one hand on your belly (abdomen). Breathe in slowly through your nose for 1 second. Your hand on your belly should move out. Pucker your lips and breathe out slowly through your lips. Your hand on your belly  should move in as you breathe out. °· Learn and use controlled coughing to clear thick spit from your lungs. The steps are: °1. Lean your head a little forward. °2. Breathe in deeply. °3. Try to hold your breath for 3 seconds. °4. Keep your mouth slightly open while coughing 2 times. °5. Spit any thick spit out into a tissue. °6. Rest and do the steps again 1 or 2 times as needed. °GET HELP IF: °· You cough up more thick spit than usual. °· There is a change in the color or thickness of the spit. °· It is harder to breathe than usual. °· Your breathing is faster than usual. °GET HELP RIGHT AWAY IF: °· You have shortness of breath while resting. °· You have shortness of breath that stops you from: °¨ Being able to talk. °¨ Doing normal activities. °· You chest hurts for longer than 5 minutes. °· Your skin color is more blue than usual. °· Your pulse oximeter shows that you have low oxygen for longer than 5 minutes. °MAKE SURE YOU: °· Understand these instructions. °· Will watch your condition. °· Will get help right away if you are not doing well or get worse. °  °This information is not intended to replace advice given to you by your health care provider. Make sure you discuss any questions you have with your health care provider. °  °Document Released: 09/06/2007 Document Revised: 04/10/2014 Document Reviewed: 11/14/2012 °Elsevier Interactive Patient   Education ©2016 [employer] ° °

## 2016-02-15 ENCOUNTER — Ambulatory Visit (INDEPENDENT_AMBULATORY_CARE_PROVIDER_SITE_OTHER): Payer: Medicare Other | Admitting: Nurse Practitioner

## 2016-02-15 ENCOUNTER — Encounter: Payer: Self-pay | Admitting: Nurse Practitioner

## 2016-02-15 VITALS — BP 138/84 | HR 81 | Temp 97.2°F | Ht 68.0 in | Wt 287.0 lb

## 2016-02-15 DIAGNOSIS — J441 Chronic obstructive pulmonary disease with (acute) exacerbation: Secondary | ICD-10-CM

## 2016-02-15 DIAGNOSIS — I10 Essential (primary) hypertension: Secondary | ICD-10-CM | POA: Diagnosis not present

## 2016-02-15 DIAGNOSIS — R1314 Dysphagia, pharyngoesophageal phase: Secondary | ICD-10-CM

## 2016-02-15 MED ORDER — AMLODIPINE BESYLATE 10 MG PO TABS: 10.0000 mg | ORAL_TABLET | Freq: Every day | ORAL | 1 refills | Status: DC

## 2016-02-15 MED ORDER — AMITRIPTYLINE HCL 50 MG PO TABS: 50.0000 mg | ORAL_TABLET | Freq: Every day | ORAL | 2 refills | Status: DC

## 2016-02-15 MED ORDER — BENZONATATE 100 MG PO CAPS: 100.0000 mg | ORAL_CAPSULE | Freq: Three times a day (TID) | ORAL | 0 refills | Status: DC | PRN

## 2016-02-15 NOTE — Patient Instructions (Signed)
Dysphagia  Swallowing problems (dysphagia) occur when solids and liquids seem to stick in your throat on the way down to your stomach, or the food takes longer to get to the stomach. Other symptoms include regurgitating food, noises coming from the throat, chest discomfort with swallowing, and a feeling of fullness or the feeling of something being stuck in your throat when swallowing. When blockage in your throat is complete, it may be associated with drooling.  CAUSES   Problems with swallowing may occur because of problems with the muscles. The food cannot be propelled in the usual manner into your stomach. You may have ulcers, scar tissue, or inflammation in the tube down which food travels from your mouth to your stomach (esophagus), which blocks food from passing normally into the stomach. Causes of inflammation include:  · Acid reflux from your stomach into your esophagus.  · Infection.  · Radiation treatment for cancer.  · Medicines taken without enough fluids to wash them down into your stomach.  You may have nerve problems that prevent signals from being sent to the muscles of your esophagus to contract and move your food down to your stomach. Globus pharyngeus is a relatively common problem in which there is a sense of an obstruction or difficulty in swallowing, without any physical abnormalities of the swallowing passages being found. This problem usually improves over time with reassurance and testing to rule out other causes.  DIAGNOSIS  Dysphagia can be diagnosed and its cause can be determined by tests in which you swallow a white substance that helps illuminate the inside of your throat (contrast medium) while X-rays are taken. Sometimes a flexible telescope that is inserted down your throat (endoscopy) to look at your esophagus and stomach is used.  TREATMENT   · If the dysphagia is caused by acid reflux or infection, medicines may be used.   · If the dysphagia is caused by problems with your swallowing muscles, swallowing therapy may be used to help you strengthen your swallowing muscles.  · If the dysphagia is caused by a blockage or mass, procedures to remove the blockage may be done.  HOME CARE INSTRUCTIONS  · Try to eat soft food that is easier to swallow and check your weight on a daily basis to be sure that it is not decreasing.  · Be sure to drink liquids when sitting upright (not lying down).  SEEK MEDICAL CARE IF:  · You are losing weight because you are unable to swallow.  · You are coughing when you drink liquids (aspiration).  · You are coughing up partially digested food.  SEEK IMMEDIATE MEDICAL CARE IF:  · You are unable to swallow your own saliva?.  · You are having shortness of breath or a fever, or both.?  · You have a hoarse voice along with difficulty swallowing.  MAKE SURE YOU:  · Understand these instructions.  · Will watch your condition.  · Will get help right away if you are not doing well or get worse.     This information is not intended to replace advice given to you by your health care provider. Make sure you discuss any questions you have with your health care provider.     Document Released: 03/17/2000 Document Revised: 04/10/2014 Document Reviewed: 09/06/2012  Elsevier Interactive Patient Education ©2017 [employer] Inc[employer]

## 2016-02-15 NOTE — Progress Notes (Signed)
   Subjective:    Patient ID: [name], male    DOB: 1945-09-14, 70 y.o.   MRN: [id number]  HPI Patient here for a PFT and a follow -up on his last visit. He said he feels better than how he felt at his last visit. He endorsed still having cough, more during the day than at night. Denies fever, chills, chest pain but has shortness of breath with exertion. Current Outpatient Prescriptions on File Prior to Visit  Medication Sig Dispense Refill  . allopurinol (ZYLOPRIM) 300 MG tablet TAKE ONE TABLET BY MOUTH ONCE DAILY 30 tablet 5  . amitriptyline (ELAVIL) 50 MG tablet Take 1 tablet (50 mg total) by mouth at bedtime. 30 tablet 2  . amLODipine (NORVASC) 10 MG tablet Take 1 tablet (10 mg total) by mouth daily. 90 tablet 1  . Fluticasone-Salmeterol (ADVAIR) 100-50 MCG/DOSE AEPB Inhale 1 puff into the lungs 2 (two) times daily. 1 each 3  . losartan (COZAAR) 50 MG tablet TAKE ONE TABLET BY MOUTH ONCE DAILY 90 tablet 1  . pantoprazole (PROTONIX) 40 MG tablet Take 1 tablet (40 mg total) by mouth 2 (two) times daily. 60 tablet 5  . albuterol (PROVENTIL HFA;VENTOLIN HFA) 108 (90 Base) MCG/ACT inhaler Inhale 2 puffs into the lungs every 6 (six) hours as needed for wheezing or shortness of breath. (Patient not taking: Reported on 02/15/2016) 1 Inhaler 0   No current facility-administered medications on file prior to visit.    * complain of having a sensation of food stuck in his chest, started about a week and half ago  Review of Systems  Constitutional: Negative.   HENT: Negative.   Respiratory: Negative for chest tightness and wheezing.   Cardiovascular: Negative for chest pain, palpitations and leg swelling.  Gastrointestinal: Negative.   Endocrine: Negative.   Musculoskeletal: Positive for joint swelling (right knee).  Psychiatric/Behavioral: Negative.        Objective:   Physical Exam  Constitutional: He appears well-developed and well-nourished.  HENT:  Head: Normocephalic and  atraumatic.  Right Ear: External ear normal.  Left Ear: External ear normal.  Mouth/Throat: Oropharynx is clear and moist.  Neck: Normal range of motion. Neck supple. No tracheal deviation present. No thyromegaly present.  Cardiovascular: Normal rate, regular rhythm and intact distal pulses.   Murmur heard.   BP 138/84   Pulse 81   Temp 97.2 F (36.2 C) (Oral)   Ht 5\' 8"  (1.727 m)   Wt 287 lb (130.2 kg)   BMI 43.64 kg/m   PFT- low risk for COPD      Assessment & Plan:   1. Essential hypertension, benign Low sodium diet - amLODipine (NORVASC) 10 MG tablet; Take 1 tablet (10 mg total) by mouth daily.  Dispense: 90 tablet; Refill: 1  2. COPD exacerbation ([place]) PFT good results - PR BREATHING CAPACITY TEST - benzonatate (TESSALON PERLES) 100 MG capsule; Take 1 capsule (100 mg total) by mouth 3 (three) times daily as needed for cough.  Dispense: 20 capsule; Refill: 0  3. Pharyngoesophageal dysphagia Chew food really well before swallowing - Ambulatory referral to Gastroenterology    Labs pending Health maintenance reviewed Diet and exercise encouraged Continue all meds Follow up  In 3 months   [place], FNP

## 2016-02-17 ENCOUNTER — Encounter: Payer: Self-pay | Admitting: Internal Medicine

## 2016-02-17 ENCOUNTER — Ambulatory Visit: Payer: Medicare Other | Admitting: Nurse Practitioner

## 2016-03-03 ENCOUNTER — Ambulatory Visit: Payer: Medicare Other | Admitting: Internal Medicine

## 2016-03-14 ENCOUNTER — Ambulatory Visit: Payer: Medicare Other | Admitting: Internal Medicine

## 2016-03-16 ENCOUNTER — Other Ambulatory Visit: Payer: Self-pay | Admitting: Family Medicine

## 2016-03-28 DIAGNOSIS — C61 Malignant neoplasm of prostate: Secondary | ICD-10-CM | POA: Diagnosis not present

## 2016-03-30 DIAGNOSIS — M1612 Unilateral primary osteoarthritis, left hip: Secondary | ICD-10-CM | POA: Diagnosis not present

## 2016-03-30 DIAGNOSIS — Z96651 Presence of right artificial knee joint: Secondary | ICD-10-CM | POA: Diagnosis not present

## 2016-03-30 DIAGNOSIS — Z471 Aftercare following joint replacement surgery: Secondary | ICD-10-CM | POA: Diagnosis not present

## 2016-03-31 ENCOUNTER — Ambulatory Visit (INDEPENDENT_AMBULATORY_CARE_PROVIDER_SITE_OTHER): Payer: Medicare Other | Admitting: Urology

## 2016-03-31 DIAGNOSIS — N3281 Overactive bladder: Secondary | ICD-10-CM | POA: Diagnosis not present

## 2016-03-31 DIAGNOSIS — C61 Malignant neoplasm of prostate: Secondary | ICD-10-CM | POA: Diagnosis not present

## 2016-04-04 ENCOUNTER — Ambulatory Visit (INDEPENDENT_AMBULATORY_CARE_PROVIDER_SITE_OTHER): Payer: Medicare Other | Admitting: Internal Medicine

## 2016-04-04 ENCOUNTER — Other Ambulatory Visit: Payer: Self-pay

## 2016-04-04 ENCOUNTER — Encounter: Payer: Self-pay | Admitting: Internal Medicine

## 2016-04-04 VITALS — BP 157/105 | HR 102 | Temp 97.4°F | Ht 68.0 in | Wt 285.4 lb

## 2016-04-04 DIAGNOSIS — R1319 Other dysphagia: Secondary | ICD-10-CM

## 2016-04-04 DIAGNOSIS — Z86010 Personal history of colonic polyps: Secondary | ICD-10-CM | POA: Diagnosis not present

## 2016-04-04 DIAGNOSIS — K219 Gastro-esophageal reflux disease without esophagitis: Secondary | ICD-10-CM | POA: Diagnosis not present

## 2016-04-04 DIAGNOSIS — R1310 Dysphagia, unspecified: Secondary | ICD-10-CM | POA: Diagnosis not present

## 2016-04-04 MED ORDER — PEG 3350-KCL-NA BICARB-NACL 420 G PO SOLR: 4000.0000 mL | ORAL | 0 refills | Status: DC

## 2016-04-04 NOTE — Progress Notes (Signed)
Primary Care Physician:  Worthy Rancher, MD Primary Gastroenterologist:  Dr. Gala Romney  Pre-Procedure History & Physical: HPI:  [name] is a 71 y.o. male here for three-week history of intermittent dysphagia. Patient reports that for about 3 weeks up until time of Thanksgiving last year patient had difficulties with swallowing solid food. He describes getting a food bolus stuck behind his lower breast spine and having to wait until it passed before resuming eating. Denies any similar symptoms before this episode nor has he had any since that time.  He does take Protonix 40 mg twice daily which controls his reflux symptoms very well. Patient denies any prior EGD.  The patient's history of multiple colonic adenomas removed by me in 2010; before that, had multiple colon polyps removed in [place]. He is overdue for surveillance examination. Since I last saw him, he was diagnosed with metastatic prostate cancer. Status post radiation therapy. Currently, does not have any rectal bleeding, melena diarrhea or constipation. He denies abdominal pain; he has any nausea or vomiting. He is a long-term smoker. With steroids, he tells me he's gained about 100 pounds in the past 1 year.  He remembers "waking up" when one of the polyps was being removed in 2010.  He has sleep apnea. Intolerant of wearing a mask. He is a long-term smoker and former consumer of alcohol.  Past Medical History:  Diagnosis Date  . Aortic atherosclerosis (HCC)    Penetrating aortic ulcer s/p endograft 2004 - followed by Dr. Trula Slade  . Arthritis   . COPD (chronic obstructive pulmonary disease) ([place])   . Coronary atherosclerosis of native coronary artery    Mild atherosclerosis at catheterization 2011  . Essential hypertension, benign   . GERD (gastroesophageal reflux disease)   . Gout   . History of kidney stones   . Neuropathy (HCC)    TOES  . Prostate cancer ([place])    radiation tx  . Shortness of breath  dyspnea    with exertion  . Sleep apnea    Intolerant of CPAP  . Tubular adenoma of colon 08/2008    Past Surgical History:  Procedure Laterality Date  . Aortic endograft repair  2004   Dr. [name]  . CHEST TUBE INSERTION  1989   "collapsed lung"due to injury  . CIRCUMCISION  09/26/2011   Procedure: CIRCUMCISION ADULT;  Surgeon: [name], MD;  Location: [facility];  Service: Urology;  Laterality: N/A;  . COLONOSCOPY W/ POLYPECTOMY    . CYSTOSCOPY  09/26/2011   Procedure: CYSTOSCOPY;  Surgeon: [name], MD;  Location: [facility];  Service: Urology;  Laterality: N/A;  . ENDOVASCULAR STENT INSERTION  September 13, 2007   EVAR - Aortic stent graft  . EVAR    . FRACTURE SURGERY     Bilateral lower arms as a child  . KNEE ARTHROSCOPY     Left  . KNEE SURGERY  Sept. 26, 2013   Torn minisc.- Right  knee  . PARTIAL KNEE ARTHROPLASTY Right 04/26/2015   Procedure: RIGHT KNEE MEDIAL UNICOMPARTMENTAL ARTHROPLASTY;  Surgeon: Gaynelle Arabian, MD;  Location: [facility];  Service: Orthopedics;  Laterality: Right;  . PROSTATE BIOPSY  09/26/2011   Procedure: BIOPSY TRANSRECTAL ULTRASONIC PROSTATE (TUBP);  Surgeon: [name], MD;  Location: [facility];  Service: Urology;  Laterality: N/A;     . UMBILICAL HERNIA REPAIR      Prior to Admission medications   Medication Sig Start Date End Date Taking? Authorizing  Provider  albuterol (PROVENTIL HFA;VENTOLIN HFA) 108 (90 Base) MCG/ACT inhaler Inhale 2 puffs into the lungs every 6 (six) hours as needed for wheezing or shortness of breath. 09/08/15  Yes [name] [name], MD  allopurinol (ZYLOPRIM) 300 MG tablet TAKE ONE TABLET BY MOUTH ONCE DAILY 12/23/15  Yes [name] [name], MD  amitriptyline (ELAVIL) 50 MG tablet Take 1 tablet (50 mg total) by mouth at bedtime. 02/15/16  Yes Mary-Margaret Hassell Done, FNP  amLODipine (NORVASC) 10 MG tablet Take 1 tablet (10 mg total) by mouth daily. 02/15/16  Yes Mary-Margaret Hassell Done, FNP  carvedilol (COREG) 25 MG tablet Take 25 mg by  mouth 2 (two) times daily. 11/29/15  Yes Historical Provider, MD  losartan (COZAAR) 50 MG tablet TAKE ONE TABLET BY MOUTH ONCE DAILY 03/17/16  Yes Mary-Margaret Hassell Done, FNP  mirabegron ER (MYRBETRIQ) 25 MG TB24 tablet Take 25 mg by mouth daily.   Yes Historical Provider, MD  pantoprazole (PROTONIX) 40 MG tablet Take 1 tablet (40 mg total) by mouth 2 (two) times daily. 06/07/15  Yes Eustaquio Maize, MD  VESICARE 5 MG tablet 5 mg daily.  01/24/16  Yes Historical Provider, MD  benzonatate (TESSALON PERLES) 100 MG capsule Take 1 capsule (100 mg total) by mouth 3 (three) times daily as needed for cough. Patient not taking: Reported on 04/04/2016 02/15/16   Mary-Margaret Hassell Done, FNP  Fluticasone-Salmeterol (ADVAIR) 100-50 MCG/DOSE AEPB Inhale 1 puff into the lungs 2 (two) times daily. Patient not taking: Reported on 04/04/2016 02/04/16   Mary-Margaret Hassell Done, FNP    Allergies as of 04/04/2016 - Review Complete 04/04/2016  Allergen Reaction Noted  . Carbidopa-levodopa Other (See Comments) 01/14/2013  . Morphine sulfate Nausea And Vomiting 09/28/2008  . Sulfacetamide sodium Nausea And Vomiting 01/31/2007  . Sulfamethoxazole-trimethoprim Nausea And Vomiting 07/01/2008    Family History  Problem Relation Age of Onset  . Stroke Mother   . Mesothelioma Father   . Cancer Father   . Hyperlipidemia Sister   . Heart attack Sister   . Cancer Brother 59  . Heart disease Brother   . Hyperlipidemia Brother     Social History   Social History  . Marital status: Married    Spouse name: N/A  . Number of children: N/A  . Years of education: N/A   Occupational History  . Not on file.   Social History Main Topics  . Smoking status: Current Every Day Smoker    Packs/day: 0.50    Years: 42.00    Types: Cigarettes  . Smokeless tobacco: Never Used     Comment: 12 ciggs per day  . Alcohol use No  . Drug use: No  . Sexual activity: Not on file   Other Topics Concern  . Not on file   Social History  Narrative  . No narrative on file    Review of Systems: See HPI, otherwise negative ROS  Physical Exam: BP (!) 157/105   Pulse (!) 102   Temp 97.4 F (36.3 C) (Oral)   Ht 5\' 8"  (1.727 m)   Wt 285 lb 6.4 oz (129.5 kg)   BMI 43.39 kg/m  General:   Alert,  Well-developed, well-nourished, pleasant and cooperative in NAD Skin:  Intact without significant lesions or rashes. Mouth:  No deformity or lesions. Neck:  Supple; no masses or thyromegaly. No significant cervical adenopathy. Lungs:  Clear throughout to auscultation.   No wheezes, crackles, or rhonchi. No acute distress. Heart:  Regular rate and rhythm; no murmurs, clicks, rubs,  or gallops. Abdomen: Non-distended, normal bowel sounds.  Soft and nontender without appreciable mass or hepatosplenomegaly.  Pulses:  Normal pulses noted. Extremities:  Without clubbing or edema.  Impression:  Pleasant 70 year old, morbidly obese, gentleman with a 3 week history of perceived esophageal dysphagia to solids. Has long-standing GERD well controlled this time on acid suppression therapy. Self limiting dysphagia a little interesting. He was in [facility] on vacation during this period of time and reports eating very well. If he had a fixed structural lesion, would expect dysphagia to be steadily intermittent and possibly progressive. His symptoms seem to be well-defined over time.  He has long-standing reflux symptoms. Further evaluation warranted.   History of multiple colonic adenomas removed over time. He is overdue for surveillance colonoscopy.  Recommendations:  I've offered the patient a surveillance colonoscopy in the near future.The risks, benefits, limitations, alternatives and imponderables have been reviewed with the patient. Questions have been answered. All parties are agreeable.   I have also offered the patient either a barium pill esophagram or go directly to EGD to further evaluate his recent dysphagia symptoms. After some  discussion, patient has opted to proceed directly with the EGD at the same time as colonoscopy.The risks, benefits, limitations, alternatives and imponderables have been reviewed with the patient. Potential for esophageal dilation, biopsy, etc. have also been reviewed.  Questions have been answered. All parties agreeable.  Because of his comorbidities and his poor experience with his  prior colonoscopy, will utilize  anesthesia for deeper sedation with propofol.  Further recommendations to follow.      Notice: This dictation was prepared with Dragon dictation along with smaller phrase technology. Any transcriptional errors that result from this process are unintentional and may not be corrected upon review.

## 2016-04-04 NOTE — Patient Instructions (Signed)
Schedule EGD with possible esophageal dilation (dysphagia) and surveillance colonoscopy (history of polyps).  Propofol  Further recommendations to follow

## 2016-04-17 ENCOUNTER — Telehealth: Payer: Self-pay

## 2016-04-17 NOTE — Telephone Encounter (Signed)
Called pt and changed time for TCS/EGD/+/-DIL that is scheduled for 04/27/16. New procedure time is 8:45am. Instructed to start drinking prep that morning at 3:45am and NPO after 5:45am. Pt has pre-op 04/25/16.

## 2016-04-21 NOTE — Patient Instructions (Signed)
[name]  04/21/2016     @PREFPERIOPPHARMACY @   Your procedure is scheduled on 04/27/2016.  Report to [facility] at 7:15 A.M.  Call this number if you have problems the morning of surgery:  [phone]   Remember:  Do not eat food or drink liquids after midnight.  Take these medicines the morning of surgery with A SIP OF WATER Allopurinol, Amlodipine, Coreg, Advair, Cozaar, Myrbetriq, Protonix   Do not wear jewelry, make-up or nail polish.  Do not wear lotions, powders, or perfumes, or deoderant.  Do not shave 48 hours prior to surgery.  Men may shave face and neck.  Do not bring valuables to the hospital.  [facility] is not responsible for any belongings or valuables.  Contacts, dentures or bridgework may not be worn into surgery.  Leave your suitcase in the car.  After surgery it may be brought to your room.  For patients admitted to the hospital, discharge time will be determined by your treatment team.  Patients discharged the day of surgery will not be allowed to drive home.    Please read over the following fact sheets that you were given. Anesthesia Post-op Instructions     PATIENT INSTRUCTIONS POST-ANESTHESIA  IMMEDIATELY FOLLOWING SURGERY:  Do not drive or operate machinery for the first twenty four hours after surgery.  Do not make any important decisions for twenty four hours after surgery or while taking narcotic pain medications or sedatives.  If you develop intractable nausea and vomiting or a severe headache please notify your doctor immediately.  FOLLOW-UP:  Please make an appointment with your surgeon as instructed. You do not need to follow up with anesthesia unless specifically instructed to do so.  WOUND CARE INSTRUCTIONS (if applicable):  Keep a dry clean dressing on the anesthesia/puncture wound site if there is drainage.  Once the wound has quit draining you may leave it open to air.  Generally you should leave the bandage intact for twenty  four hours unless there is drainage.  If the epidural site drains for more than 36-48 hours please call the anesthesia department.  QUESTIONS?:  Please feel free to call your physician or the hospital operator if you have any questions, and they will be happy to assist you.      Esophagogastroduodenoscopy Introduction Esophagogastroduodenoscopy (EGD) is a procedure to examine the lining of the esophagus, stomach, and first part of the small intestine (duodenum). This procedure is done to check for problems such as inflammation, bleeding, ulcers, or growths. During this procedure, a long, flexible, lighted tube with a camera attached (endoscope) is inserted down the throat. Tell a health care provider about:  Any allergies you have.  All medicines you are taking, including vitamins, herbs, eye drops, creams, and over-the-counter medicines.  Any problems you or family members have had with anesthetic medicines.  Any blood disorders you have.  Any surgeries you have had.  Any medical conditions you have.  Whether you are pregnant or may be pregnant. What are the risks? Generally, this is a safe procedure. However, problems may occur, including:  Infection.  Bleeding.  A tear (perforation) in the esophagus, stomach, or duodenum.  Trouble breathing.  Excessive sweating.  Spasms of the larynx.  A slowed heartbeat.  Low blood pressure. What happens before the procedure?  Follow instructions from your health care provider about eating or drinking restrictions.  Ask your health care provider about:  Changing or stopping your regular medicines. This is especially  important if you are taking diabetes medicines or blood thinners.  Taking medicines such as aspirin and ibuprofen. These medicines can thin your blood. Do not take these medicines before your procedure if your health care provider instructs you not to.  Plan to have someone take you home after the procedure.  If  you wear dentures, be ready to remove them before the procedure. What happens during the procedure?  To reduce your risk of infection, your health care team will wash or sanitize their hands.  An IV tube will be put in a vein in your hand or arm. You will get medicines and fluids through this tube.  You will be given one or more of the following:  A medicine to help you relax (sedative).  A medicine to numb the area (local anesthetic). This medicine may be sprayed into your throat. It will make you feel more comfortable and keep you from gagging or coughing during the procedure.  A medicine for pain.  A mouth guard may be placed in your mouth to protect your teeth and to keep you from biting on the endoscope.  You will be asked to lie on your left side.  The endoscope will be lowered down your throat into your esophagus, stomach, and duodenum.  Air will be put into the endoscope. This will help your health care provider see better.  The lining of your esophagus, stomach, and duodenum will be examined.  Your health care provider may:  Take a tissue sample so it can be looked at in a lab (biopsy).  Remove growths.  Remove objects (foreign bodies) that are stuck.  Treat any bleeding with medicines or other devices that stop tissue from bleeding.  Widen (dilate) or stretch narrowed areas of your esophagus and stomach.  The endoscope will be taken out. The procedure may vary among health care providers and hospitals. What happens after the procedure?  Your blood pressure, heart rate, breathing rate, and blood oxygen level will be monitored often until the medicines you were given have worn off.  Do not eat or drink anything until the numbing medicine has worn off and your gag reflex has returned. This information is not intended to replace advice given to you by your health care provider. Make sure you discuss any questions you have with your health care provider. Document  Released: 07/21/2004 Document Revised: 08/26/2015 Document Reviewed: 02/11/2015  2017 Elsevier Esophageal Dilatation Esophageal dilatation is a procedure to open a blocked or narrowed part of the esophagus. The esophagus is the long tube in your throat that carries food and liquid from your mouth to your stomach. The procedure is also called esophageal dilation. You may need this procedure if you have a buildup of scar tissue in your esophagus that makes it difficult, painful, or even impossible to swallow. This can be caused by gastroesophageal reflux disease (GERD). In rare cases, people need this procedure because they have cancer of the esophagus or a problem with the way food moves through the esophagus. Sometimes you may need to have another dilatation to enlarge the opening of the esophagus gradually. Tell a health care provider about:  Any allergies you have.  All medicines you are taking, including vitamins, herbs, eye drops, creams, and over-the-counter medicines.  Any problems you or family members have had with anesthetic medicines.  Any blood disorders you have.  Any surgeries you have had.  Any medical conditions you have.  Any antibiotic medicines you are required to take  before dental procedures. What are the risks? Generally, this is a safe procedure. However, problems can occur and include:  Bleeding from a tear in the lining of the esophagus.  A hole (perforation) in the esophagus. What happens before the procedure?  Do not eat or drink anything after midnight on the night before the procedure or as directed by your health care provider.  Ask your health care provider about changing or stopping your regular medicines. This is especially important if you are taking diabetes medicines or blood thinners.  Plan to have someone take you home after the procedure. What happens during the procedure?  You will be given a medicine that makes you relaxed and sleepy  (sedative).  A medicine may be sprayed or gargled to numb the back of the throat.  Your health care provider can use various instruments to do an esophageal dilatation. During the procedure, the instrument used will be placed in your mouth and passed down into your esophagus. Options include:  Simple dilators. This instrument is carefully placed in the esophagus to stretch it.  Guided wire bougies. In this method, a flexible tube (endoscope) is used to insert a wire into the esophagus. The dilator is passed over this wire to enlarge the esophagus. Then the wire is removed.  Balloon dilators. An endoscope with a small balloon at the end is passed down into the esophagus. Inflating the balloon gently stretches the esophagus and opens it up. What happens after the procedure?  Your blood pressure, heart rate, breathing rate, and blood oxygen level will be monitored often until the medicines you were given have worn off.  Your throat may feel slightly sore and will probably still feel numb. This will improve slowly over time.  You will not be allowed to eat or drink until the throat numbness has resolved.  If this is a same-day procedure, you may be allowed to go home once you have been able to drink, urinate, and sit on the edge of the bed without nausea or dizziness.  If this is a same-day procedure, you should have a friend or family member with you for the next 24 hours after the procedure. This information is not intended to replace advice given to you by your health care provider. Make sure you discuss any questions you have with your health care provider. Document Released: 05/11/2005 Document Revised: 08/26/2015 Document Reviewed: 07/30/2013 Elsevier Interactive Patient Education  2017 [place]. Colonoscopy, Adult A colonoscopy is an exam to look at the entire large intestine. During the exam, a lubricated, bendable tube is inserted into the anus and then passed into the rectum,  colon, and other parts of the large intestine. A colonoscopy is often done as a part of normal colorectal screening or in response to certain symptoms, such as anemia, persistent diarrhea, abdominal pain, and blood in the stool. The exam can help screen for and diagnose medical problems, including:  Tumors.  Polyps.  Inflammation.  Areas of bleeding. Tell a health care provider about:  Any allergies you have.  All medicines you are taking, including vitamins, herbs, eye drops, creams, and over-the-counter medicines.  Any problems you or family members have had with anesthetic medicines.  Any blood disorders you have.  Any surgeries you have had.  Any medical conditions you have.  Any problems you have had passing stool. What are the risks? Generally, this is a safe procedure. However, problems may occur, including:  Bleeding.  A tear in the intestine.  A reaction to medicines given during the exam.  Infection (rare). What happens before the procedure? Eating and drinking restrictions  Follow instructions from your health care provider about eating and drinking, which may include:  A few days before the procedure - follow a low-fiber diet. Avoid nuts, seeds, dried fruit, raw fruits, and vegetables.  1-3 days before the procedure - follow a clear liquid diet. Drink only clear liquids, such as clear broth or bouillon, black coffee or tea, clear juice, clear soft drinks or sports drinks, gelatin desert, and popsicles. Avoid any liquids that contain red or purple dye.  On the day of the procedure - do not eat or drink anything during the 2 hours before the procedure, or within the time period that your health care provider recommends. Bowel prep  If you were prescribed an oral bowel prep to clean out your colon:  Take it as told by your health care provider. Starting the day before your procedure, you will need to drink a large amount of medicated liquid. The liquid will  cause you to have multiple loose stools until your stool is almost clear or light green.  If your skin or anus gets irritated from diarrhea, you may use these to relieve the irritation:  Medicated wipes, such as adult wet wipes with aloe and vitamin E.  A skin soothing-product like petroleum jelly.  If you vomit while drinking the bowel prep, take a break for up to 60 minutes and then begin the bowel prep again. If vomiting continues and you cannot take the bowel prep without vomiting, call your health care provider. General instructions  Ask your health care provider about changing or stopping your regular medicines. This is especially important if you are taking diabetes medicines or blood thinners.  Plan to have someone take you home from the hospital or clinic. What happens during the procedure?  An IV tube may be inserted into one of your veins.  You will be given medicine to help you relax (sedative).  To reduce your risk of infection:  Your health care team will wash or sanitize their hands.  Your anal area will be washed with soap.  You will be asked to lie on your side with your knees bent.  Your health care provider will lubricate a long, thin, flexible tube. The tube will have a camera and a light on the end.  The tube will be inserted into your anus.  The tube will be gently eased through your rectum and colon.  Air will be delivered into your colon to keep it open. You may feel some pressure or cramping.  The camera will be used to take images during the procedure.  A small tissue sample may be removed from your body to be examined under a microscope (biopsy). If any potential problems are found, the tissue will be sent to a lab for testing.  If small polyps are found, your health care provider may remove them and have them checked for cancer cells.  The tube that was inserted into your anus will be slowly removed. The procedure may vary among health care  providers and hospitals. What happens after the procedure?  Your blood pressure, heart rate, breathing rate, and blood oxygen level will be monitored until the medicines you were given have worn off.  Do not drive for 24 hours after the exam.  You may have a small amount of blood in your stool.  You may pass gas and have mild  abdominal cramping or bloating due to the air that was used to inflate your colon during the exam.  It is up to you to get the results of your procedure. Ask your health care provider, or the department performing the procedure, when your results will be ready. This information is not intended to replace advice given to you by your health care provider. Make sure you discuss any questions you have with your health care provider. Document Released: 03/17/2000 Document Revised: 10/08/2015 Document Reviewed: 06/01/2015 Elsevier Interactive Patient Education  2017 [employer].

## 2016-04-25 ENCOUNTER — Encounter (HOSPITAL_COMMUNITY): Payer: Self-pay

## 2016-04-25 ENCOUNTER — Encounter (HOSPITAL_COMMUNITY)
Admission: RE | Admit: 2016-04-25 | Discharge: 2016-04-25 | Disposition: A | Discharge status: Home / Self Care | Payer: Medicare Other | Source: Ambulatory Visit | Attending: Internal Medicine | Admitting: Internal Medicine

## 2016-04-25 DIAGNOSIS — I2510 Atherosclerotic heart disease of native coronary artery without angina pectoris: Secondary | ICD-10-CM | POA: Diagnosis not present

## 2016-04-25 DIAGNOSIS — I10 Essential (primary) hypertension: Secondary | ICD-10-CM | POA: Diagnosis not present

## 2016-04-25 DIAGNOSIS — Z6841 Body Mass Index (BMI) 40.0 and over, adult: Secondary | ICD-10-CM | POA: Diagnosis not present

## 2016-04-25 DIAGNOSIS — Z923 Personal history of irradiation: Secondary | ICD-10-CM | POA: Diagnosis not present

## 2016-04-25 DIAGNOSIS — D123 Benign neoplasm of transverse colon: Secondary | ICD-10-CM | POA: Diagnosis not present

## 2016-04-25 DIAGNOSIS — D122 Benign neoplasm of ascending colon: Secondary | ICD-10-CM | POA: Diagnosis not present

## 2016-04-25 DIAGNOSIS — K449 Diaphragmatic hernia without obstruction or gangrene: Secondary | ICD-10-CM | POA: Diagnosis not present

## 2016-04-25 DIAGNOSIS — Z86010 Personal history of colonic polyps: Secondary | ICD-10-CM | POA: Diagnosis not present

## 2016-04-25 DIAGNOSIS — G4730 Sleep apnea, unspecified: Secondary | ICD-10-CM | POA: Diagnosis not present

## 2016-04-25 DIAGNOSIS — Z8546 Personal history of malignant neoplasm of prostate: Secondary | ICD-10-CM | POA: Diagnosis not present

## 2016-04-25 DIAGNOSIS — J449 Chronic obstructive pulmonary disease, unspecified: Secondary | ICD-10-CM | POA: Diagnosis not present

## 2016-04-25 DIAGNOSIS — D120 Benign neoplasm of cecum: Secondary | ICD-10-CM | POA: Diagnosis not present

## 2016-04-25 DIAGNOSIS — D124 Benign neoplasm of descending colon: Secondary | ICD-10-CM | POA: Diagnosis not present

## 2016-04-25 DIAGNOSIS — Z01818 Encounter for other preprocedural examination: Secondary | ICD-10-CM | POA: Insufficient documentation

## 2016-04-25 DIAGNOSIS — K219 Gastro-esophageal reflux disease without esophagitis: Secondary | ICD-10-CM | POA: Diagnosis not present

## 2016-04-25 DIAGNOSIS — F17210 Nicotine dependence, cigarettes, uncomplicated: Secondary | ICD-10-CM | POA: Diagnosis not present

## 2016-04-25 DIAGNOSIS — R1310 Dysphagia, unspecified: Secondary | ICD-10-CM | POA: Diagnosis not present

## 2016-04-25 DIAGNOSIS — Z1211 Encounter for screening for malignant neoplasm of colon: Secondary | ICD-10-CM | POA: Diagnosis not present

## 2016-04-25 DIAGNOSIS — K648 Other hemorrhoids: Secondary | ICD-10-CM | POA: Diagnosis not present

## 2016-04-25 LAB — CBC WITH DIFFERENTIAL/PLATELET
Basophils Absolute: 0 10*3/uL (ref 0.0–0.1)
Basophils Relative: 1 %
Eosinophils Absolute: 0.7 10*3/uL (ref 0.0–0.7)
Eosinophils Relative: 12 %
HCT: 38.7 % — ABNORMAL LOW (ref 39.0–52.0)
Hemoglobin: 12.9 g/dL — ABNORMAL LOW (ref 13.0–17.0)
Lymphocytes Relative: 11 %
Lymphs Abs: 0.6 10*3/uL — ABNORMAL LOW (ref 0.7–4.0)
MCH: 29.8 pg (ref 26.0–34.0)
MCHC: 33.3 g/dL (ref 30.0–36.0)
MCV: 89.4 fL (ref 78.0–100.0)
Monocytes Absolute: 0.3 10*3/uL (ref 0.1–1.0)
Monocytes Relative: 5 %
Neutro Abs: 4.1 10*3/uL (ref 1.7–7.7)
Neutrophils Relative %: 71 %
Platelets: 215 10*3/uL (ref 150–400)
RBC: 4.33 MIL/uL (ref 4.22–5.81)
RDW: 15.5 % (ref 11.5–15.5)
WBC: 5.7 10*3/uL (ref 4.0–10.5)

## 2016-04-25 LAB — BASIC METABOLIC PANEL
Anion gap: 9 (ref 5–15)
BUN: 21 mg/dL — ABNORMAL HIGH (ref 6–20)
CO2: 23 mmol/L (ref 22–32)
Calcium: 8.5 mg/dL — ABNORMAL LOW (ref 8.9–10.3)
Chloride: 106 mmol/L (ref 101–111)
Creatinine, Ser: 1.56 mg/dL — ABNORMAL HIGH (ref 0.61–1.24)
GFR calc Af Amer: 50 mL/min — ABNORMAL LOW (ref >60)
GFR calc non Af Amer: 43 mL/min — ABNORMAL LOW (ref >60)
Glucose, Bld: 124 mg/dL — ABNORMAL HIGH (ref 65–99)
Potassium: 3.5 mmol/L (ref 3.5–5.1)
Sodium: 138 mmol/L (ref 135–145)

## 2016-04-27 ENCOUNTER — Ambulatory Visit (HOSPITAL_COMMUNITY): Payer: Medicare Other | Admitting: Anesthesiology

## 2016-04-27 ENCOUNTER — Encounter (HOSPITAL_COMMUNITY): Payer: Self-pay

## 2016-04-27 ENCOUNTER — Encounter (HOSPITAL_COMMUNITY)
Admission: RE | Disposition: A | Discharge status: Home / Self Care | Payer: Self-pay | Source: Ambulatory Visit | Attending: Internal Medicine

## 2016-04-27 ENCOUNTER — Ambulatory Visit (HOSPITAL_COMMUNITY)
Admission: RE | Admit: 2016-04-27 | Discharge: 2016-04-27 | Disposition: A | Discharge status: Home / Self Care | Payer: Medicare Other | Source: Ambulatory Visit | Attending: Internal Medicine | Admitting: Internal Medicine

## 2016-04-27 DIAGNOSIS — I2510 Atherosclerotic heart disease of native coronary artery without angina pectoris: Secondary | ICD-10-CM | POA: Insufficient documentation

## 2016-04-27 DIAGNOSIS — Z923 Personal history of irradiation: Secondary | ICD-10-CM | POA: Insufficient documentation

## 2016-04-27 DIAGNOSIS — Z860100 Personal history of colon polyps, unspecified: Secondary | ICD-10-CM

## 2016-04-27 DIAGNOSIS — Z1211 Encounter for screening for malignant neoplasm of colon: Secondary | ICD-10-CM | POA: Insufficient documentation

## 2016-04-27 DIAGNOSIS — J449 Chronic obstructive pulmonary disease, unspecified: Secondary | ICD-10-CM | POA: Diagnosis not present

## 2016-04-27 DIAGNOSIS — K648 Other hemorrhoids: Secondary | ICD-10-CM | POA: Insufficient documentation

## 2016-04-27 DIAGNOSIS — D120 Benign neoplasm of cecum: Secondary | ICD-10-CM | POA: Diagnosis not present

## 2016-04-27 DIAGNOSIS — F17210 Nicotine dependence, cigarettes, uncomplicated: Secondary | ICD-10-CM | POA: Diagnosis not present

## 2016-04-27 DIAGNOSIS — D122 Benign neoplasm of ascending colon: Secondary | ICD-10-CM | POA: Insufficient documentation

## 2016-04-27 DIAGNOSIS — G4730 Sleep apnea, unspecified: Secondary | ICD-10-CM | POA: Diagnosis not present

## 2016-04-27 DIAGNOSIS — D123 Benign neoplasm of transverse colon: Secondary | ICD-10-CM | POA: Diagnosis not present

## 2016-04-27 DIAGNOSIS — K449 Diaphragmatic hernia without obstruction or gangrene: Secondary | ICD-10-CM | POA: Diagnosis not present

## 2016-04-27 DIAGNOSIS — R1310 Dysphagia, unspecified: Secondary | ICD-10-CM

## 2016-04-27 DIAGNOSIS — D124 Benign neoplasm of descending colon: Secondary | ICD-10-CM | POA: Insufficient documentation

## 2016-04-27 DIAGNOSIS — I10 Essential (primary) hypertension: Secondary | ICD-10-CM | POA: Insufficient documentation

## 2016-04-27 DIAGNOSIS — D125 Benign neoplasm of sigmoid colon: Secondary | ICD-10-CM | POA: Diagnosis not present

## 2016-04-27 DIAGNOSIS — Z8546 Personal history of malignant neoplasm of prostate: Secondary | ICD-10-CM | POA: Insufficient documentation

## 2016-04-27 DIAGNOSIS — K219 Gastro-esophageal reflux disease without esophagitis: Secondary | ICD-10-CM | POA: Insufficient documentation

## 2016-04-27 DIAGNOSIS — Z6841 Body Mass Index (BMI) 40.0 and over, adult: Secondary | ICD-10-CM | POA: Insufficient documentation

## 2016-04-27 HISTORY — PX: POLYPECTOMY: SHX5525

## 2016-04-27 HISTORY — PX: MALONEY DILATION: SHX5535

## 2016-04-27 HISTORY — PX: ESOPHAGOGASTRODUODENOSCOPY (EGD) WITH PROPOFOL: SHX5813

## 2016-04-27 HISTORY — PX: COLONOSCOPY WITH PROPOFOL: SHX5780

## 2016-04-27 SURGERY — COLONOSCOPY WITH PROPOFOL
Anesthesia: Monitor Anesthesia Care

## 2016-04-27 MED ORDER — CHLORHEXIDINE GLUCONATE CLOTH 2 % EX PADS: 6.0000 | MEDICATED_PAD | Freq: Once | CUTANEOUS | Status: DC

## 2016-04-27 MED ORDER — FENTANYL CITRATE (PF) 100 MCG/2ML IJ SOLN
INTRAMUSCULAR | Status: AC
  Filled 2016-04-27: qty 2

## 2016-04-27 MED ORDER — ONDANSETRON HCL 4 MG/2ML IJ SOLN
INTRAMUSCULAR | Status: AC
  Filled 2016-04-27: qty 2

## 2016-04-27 MED ORDER — FENTANYL CITRATE (PF) 100 MCG/2ML IJ SOLN
25.0000 ug | INTRAMUSCULAR | Status: AC | PRN
  Administered 2016-04-27 (×2): 25 ug via INTRAVENOUS

## 2016-04-27 MED ORDER — MIDAZOLAM HCL 2 MG/2ML IJ SOLN
INTRAMUSCULAR | Status: AC
  Filled 2016-04-27: qty 2

## 2016-04-27 MED ORDER — LIDOCAINE VISCOUS 2 % MT SOLN
OROMUCOSAL | Status: AC
  Filled 2016-04-27: qty 15

## 2016-04-27 MED ORDER — MIDAZOLAM HCL 2 MG/2ML IJ SOLN
0.5000 mg | INTRAMUSCULAR | Status: DC | PRN
  Administered 2016-04-27: 2 mg via INTRAVENOUS

## 2016-04-27 MED ORDER — PROPOFOL 10 MG/ML IV BOLUS
INTRAVENOUS | Status: AC
  Filled 2016-04-27: qty 60

## 2016-04-27 MED ORDER — LACTATED RINGERS IV SOLN
INTRAVENOUS | Status: DC
  Administered 2016-04-27: 07:00:00 via INTRAVENOUS

## 2016-04-27 MED ORDER — LABETALOL HCL 5 MG/ML IV SOLN
10.0000 mg | Freq: Once | INTRAVENOUS | Status: AC
  Administered 2016-04-27: 10 mg via INTRAVENOUS

## 2016-04-27 MED ORDER — LABETALOL HCL 5 MG/ML IV SOLN
INTRAVENOUS | Status: AC
  Filled 2016-04-27: qty 4

## 2016-04-27 MED ORDER — PROPOFOL 500 MG/50ML IV EMUL
INTRAVENOUS | Status: DC | PRN
  Administered 2016-04-27: 08:00:00 via INTRAVENOUS
  Administered 2016-04-27: 100 ug/kg/min via INTRAVENOUS
  Administered 2016-04-27: 09:00:00 via INTRAVENOUS

## 2016-04-27 MED ORDER — LIDOCAINE VISCOUS 2 % MT SOLN
5.0000 mL | Freq: Once | OROMUCOSAL | Status: AC
  Administered 2016-04-27: 5 mL via OROMUCOSAL

## 2016-04-27 MED ORDER — MIDAZOLAM HCL 5 MG/5ML IJ SOLN
INTRAMUSCULAR | Status: DC | PRN
  Administered 2016-04-27 (×2): 1 mg via INTRAVENOUS

## 2016-04-27 NOTE — Interval H&P Note (Signed)
History and Physical Interval Note:  04/27/2016 7:55 AM  [name]  has presented today for surgery, with the diagnosis of HX OF POLYPS/DYSPHAGIA  The various methods of treatment have been discussed with the patient and family. After consideration of risks, benefits and other options for treatment, the patient has consented to  Procedure(s) with comments: COLONOSCOPY WITH PROPOFOL (N/A) - 1115-moved to 845 per Ginger ESOPHAGOGASTRODUODENOSCOPY (EGD) WITH PROPOFOL (N/A) MALONEY DILATION (N/A) as a surgical intervention .  The patient's history has been reviewed, patient examined, no change in status, stable for surgery.  I have reviewed the patient's chart and labs.  Questions were answered to the patient's satisfaction.     Morgen Ritacco  No change; EGD/ed and tcs per plan The risks, benefits, limitations, imponderables and alternatives regarding both EGD and colonoscopy have been reviewed with the patient. Questions have been answered. All parties agreeable.

## 2016-04-27 NOTE — Transfer of Care (Signed)
Immediate Anesthesia Transfer of Care Note  Patient: [name]  Procedure(s) Performed: Procedure(s) with comments: COLONOSCOPY WITH PROPOFOL (N/A) - 1115-moved to 845 per Ginger ESOPHAGOGASTRODUODENOSCOPY (EGD) WITH PROPOFOL (N/A) MALONEY DILATION (N/A) POLYPECTOMY - cecal , ascending, descending, and sigmoid polypectomies  Patient Location: PACU  Anesthesia Type:MAC  Level of Consciousness: awake and patient cooperative  Airway & Oxygen Therapy: Patient Spontanous Breathing and Patient connected to face mask oxygen  Post-op Assessment: Report given to RN, Post -op Vital signs reviewed and stable and Patient moving all extremities  Post vital signs: Reviewed and stable  Last Vitals:  Vitals:   04/27/16 0750 04/27/16 0755  BP: 138/83 137/84  Pulse:    Resp: 13 16  Temp:      Last Pain:  Vitals:   04/27/16 0717  TempSrc: Oral      Patients Stated Pain Goal: 8 (57/01/77 9390)  Complications: No apparent anesthesia complications

## 2016-04-27 NOTE — Op Note (Signed)
[facility] Patient Name: [name] Procedure Date: 04/27/2016 8:04 AM MRN: [id number] Date of Birth: June 28, 1945 Attending MD: Norvel Richards , MD CSN: [id number] Age: 71 Admit Type: Outpatient Procedure:                Upper GI endoscopy with maloney dilation Indications:              Dysphagia Providers:                Norvel Richards, MD, Lurline Del, RN, Purcell Nails.                            [place], [employer] Referring MD:              Medicines:                Propofol per Anesthesia Complications:            No immediate complications. Estimated Blood Loss:     Estimated blood loss was minimal. Procedure:                Pre-Anesthesia Assessment:                           - Prior to the procedure, a History and Physical                            was performed, and patient medications and                            allergies were reviewed. The patient's tolerance of                            previous anesthesia was also reviewed. The risks                            and benefits of the procedure and the sedation                            options and risks were discussed with the patient.                            All questions were answered, and informed consent                            was obtained. Prior Anticoagulants: The patient has                            taken no previous anticoagulant or antiplatelet                            agents. ASA Grade Assessment: III - A patient with                            severe systemic disease. After reviewing the risks  and benefits, the patient was deemed in                            satisfactory condition to undergo the procedure.                           After obtaining informed consent, the endoscope was                            passed under direct vision. Throughout the                            procedure, the patient's blood pressure, pulse, and   oxygen saturations were monitored continuously. The                            [id number]) scope was introduced through the                            and advanced to the second part of duodenum. The                            upper GI endoscopy was accomplished without                            difficulty. The patient tolerated the procedure                            well. Scope In: 8:14:30 AM Scope Out: 8:21:11 AM Total Procedure Duration: 0 hours 6 minutes 41 seconds  Findings:      The examined esophagus was normal.      A small hiatal hernia was present. Stomach otherwise normal.      The duodenal bulb and second portion of the duodenum were normal. The       scope was withdrawn. Dilation was performed with a Maloney dilator with       no resistance at 56 Fr. The dilation site was examined following       endoscope reinsertion and showed no change. Estimated blood loss was       minimal. Impression:               - Normal esophagus. Dilated.                           - Small hiatal hernia.                           - Normal duodenal bulb and second portion of the                            duodenum.                           - No specimens collected. Moderate Sedation:      Moderate (conscious) sedation was personally administered by an       anesthesia professional. The following parameters were monitored: oxygen  saturation, heart rate, blood pressure, respiratory rate, EKG, adequacy       of pulmonary ventilation, and response to care. Total physician       intraservice time was 15 minutes. Recommendation:           - Patient has a contact number available for                            emergencies. The signs and symptoms of potential                            delayed complications were discussed with the                            patient. Return to normal activities tomorrow.                            Written discharge instructions were provided to the                             patient.                           - Resume previous diet.                           - Continue present medications.                           - Patient has a contact number available for                            emergencies. The signs and symptoms of potential                            delayed complications were discussed with the                            patient. Return to normal activities tomorrow.                            Written discharge instructions were provided to the                            patient.                           - No repeat upper endoscopy.                           - Return to GI office in 3 months. See colonoscopy                            report Procedure Code(s):        --- Professional ---  28315, Esophagogastroduodenoscopy, flexible,                            transoral; diagnostic, including collection of                            specimen(s) by brushing or washing, when performed                            (separate procedure)                           43450, Dilation of esophagus, by unguided sound or                            bougie, single or multiple passes Diagnosis Code(s):        --- Professional ---                           K44.9, Diaphragmatic hernia without obstruction or                            gangrene                           R13.10, Dysphagia, unspecified CPT copyright 2016 American Medical Association. All rights reserved. The codes documented in this report are preliminary and upon coder review may  be revised to meet current compliance requirements. [name]. Kennita Pavlovich, MD Norvel Richards, MD 04/27/2016 9:00:44 AM This report has been signed electronically. Number of Addenda: 0

## 2016-04-27 NOTE — Anesthesia Preprocedure Evaluation (Signed)
Anesthesia Evaluation  Patient identified by MRN, date of birth, ID band Patient awake    Reviewed: Allergy & Precautions, NPO status , Unable to perform ROS - Chart review only  Airway Mallampati: II  TM Distance: >3 FB Neck ROM: Full    Dental  (+) Teeth Intact, Implants, Dental Advisory Given,    Pulmonary shortness of breath, sleep apnea , COPD, Current Smoker,    breath sounds clear to auscultation       Cardiovascular hypertension, + CAD and + Peripheral Vascular Disease (AAA)   Rhythm:Regular Rate:Normal     Neuro/Psych PSYCHIATRIC DISORDERS Anxiety    GI/Hepatic Neg liver ROS, GERD  Medicated,  Endo/Other    Renal/GU Renal InsufficiencyRenal disease     Musculoskeletal   Abdominal   Peds  Hematology   Anesthesia Other Findings   Reproductive/Obstetrics                             Anesthesia Physical Anesthesia Plan  ASA: III  Anesthesia Plan: MAC   Post-op Pain Management:    Induction: Intravenous  Airway Management Planned: Simple Face Mask  Additional Equipment:   Intra-op Plan:   Post-operative Plan:   Informed Consent: I have reviewed the patients History and Physical, chart, labs and discussed the procedure including the risks, benefits and alternatives for the proposed anesthesia with the patient or authorized representative who has indicated his/her understanding and acceptance.     Plan Discussed with:   Anesthesia Plan Comments:         Anesthesia Quick Evaluation

## 2016-04-27 NOTE — Discharge Instructions (Addendum)
EGD Discharge instructions Please read the instructions outlined below and refer to this sheet in the next few weeks. These discharge instructions provide you with general information on caring for yourself after you leave the hospital. Your doctor may also give you specific instructions. While your treatment has been planned according to the most current medical practices available, unavoidable complications occasionally occur. If you have any problems or questions after discharge, please call your doctor. ACTIVITY  You may resume your regular activity but move at a slower pace for the next 24 hours.   Take frequent rest periods for the next 24 hours.   Walking will help expel (get rid of) the air and reduce the bloated feeling in your abdomen.   No driving for 24 hours (because of the anesthesia (medicine) used during the test).   You may shower.   Do not sign any important legal documents or operate any machinery for 24 hours (because of the anesthesia used during the test).  NUTRITION  Drink plenty of fluids.   You may resume your normal diet.   Begin with a light meal and progress to your normal diet.   Avoid alcoholic beverages for 24 hours or as instructed by your caregiver.  MEDICATIONS  You may resume your normal medications unless your caregiver tells you otherwise.  WHAT YOU CAN EXPECT TODAY  You may experience abdominal discomfort such as a feeling of fullness or gas pains.  FOLLOW-UP  Your doctor will discuss the results of your test with you.  SEEK IMMEDIATE MEDICAL ATTENTION IF ANY OF THE FOLLOWING OCCUR:  Excessive nausea (feeling sick to your stomach) and/or vomiting.   Severe abdominal pain and distention (swelling).   Trouble swallowing.   Temperature over 101 F (37.8 C).   Rectal bleeding or vomiting of blood.     Colonoscopy Discharge Instructions  Read the instructions outlined below and refer to this sheet in the next few weeks. These  discharge instructions provide you with general information on caring for yourself after you leave the hospital. Your doctor may also give you specific instructions. While your treatment has been planned according to the most current medical practices available, unavoidable complications occasionally occur. If you have any problems or questions after discharge, call Dr. Gala Romney at [phone]. ACTIVITY  You may resume your regular activity, but move at a slower pace for the next 24 hours.   Take frequent rest periods for the next 24 hours.   Walking will help get rid of the air and reduce the bloated feeling in your belly (abdomen).   No driving for 24 hours (because of the medicine (anesthesia) used during the test).    Do not sign any important legal documents or operate any machinery for 24 hours (because of the anesthesia used during the test).  NUTRITION  Drink plenty of fluids.   You may resume your normal diet as instructed by your doctor.   Begin with a light meal and progress to your normal diet. Heavy or fried foods are harder to digest and may make you feel sick to your stomach (nauseated).   Avoid alcoholic beverages for 24 hours or as instructed.  MEDICATIONS  You may resume your normal medications unless your doctor tells you otherwise.  WHAT YOU CAN EXPECT TODAY  Some feelings of bloating in the abdomen.   Passage of more gas than usual.   Spotting of blood in your stool or on the toilet paper.  IF YOU HAD POLYPS REMOVED DURING  THE COLONOSCOPY:  No aspirin products for 7 days or as instructed.   No alcohol for 7 days or as instructed.   Eat a soft diet for the next 24 hours.  FINDING OUT THE RESULTS OF YOUR TEST Not all test results are available during your visit. If your test results are not back during the visit, make an appointment with your caregiver to find out the results. Do not assume everything is normal if you have not heard from your caregiver or the  medical facility. It is important for you to follow up on all of your test results.  SEEK IMMEDIATE MEDICAL ATTENTION IF:  You have more than a spotting of blood in your stool.   Your belly is swollen (abdominal distention).   You are nauseated or vomiting.   You have a temperature over 101.   You have abdominal pain or discomfort that is severe or gets worse throughout the day.     GERD and colon polyp information provided  Continue Protonix 40 mg twice daily  Further recommendations to follow. Pathology report    Gastroesophageal Reflux Disease, Adult Normally, food travels down the esophagus and stays in the stomach to be digested. However, when a person has gastroesophageal reflux disease (GERD), food and stomach acid move back up into the esophagus. When this happens, the esophagus becomes sore and inflamed. Over time, GERD can create small holes (ulcers) in the lining of the esophagus. What are the causes? This condition is caused by a problem with the muscle between the esophagus and the stomach (lower esophageal sphincter, or LES). Normally, the LES muscle closes after food passes through the esophagus to the stomach. When the LES is weakened or abnormal, it does not close properly, and that allows food and stomach acid to go back up into the esophagus. The LES can be weakened by certain dietary substances, medicines, and medical conditions, including:  Tobacco use.  Pregnancy.  Having a hiatal hernia.  Heavy alcohol use.  Certain foods and beverages, such as coffee, chocolate, onions, and peppermint. What increases the risk? This condition is more likely to develop in:  People who have an increased body weight.  People who have connective tissue disorders.  People who use NSAID medicines. What are the signs or symptoms? Symptoms of this condition include:  Heartburn.  Difficult or painful swallowing.  The feeling of having a lump in the throat.  Abitter  taste in the mouth.  Bad breath.  Having a large amount of saliva.  Having an upset or bloated stomach.  Belching.  Chest pain.  Shortness of breath or wheezing.  Ongoing (chronic) cough or a night-time cough.  Wearing away of tooth enamel.  Weight loss. Different conditions can cause chest pain. Make sure to see your health care provider if you experience chest pain. How is this diagnosed? Your health care provider will take a medical history and perform a physical exam. To determine if you have mild or severe GERD, your health care provider may also monitor how you respond to treatment. You may also have other tests, including:  An endoscopy toexamine your stomach and esophagus with a small camera.  A test thatmeasures the acidity level in your esophagus.  A test thatmeasures how much pressure is on your esophagus.  A barium swallow or modified barium swallow to show the shape, size, and functioning of your esophagus. How is this treated? The goal of treatment is to help relieve your symptoms and to prevent  complications. Treatment for this condition may vary depending on how severe your symptoms are. Your health care provider may recommend:  Changes to your diet.  Medicine.  Surgery. Follow these instructions at home: Diet  Follow a diet as recommended by your health care provider. This may involve avoiding foods and drinks such as:  Coffee and tea (with or without caffeine).  Drinks that containalcohol.  Energy drinks and sports drinks.  Carbonated drinks or sodas.  Chocolate and cocoa.  Peppermint and mint flavorings.  Garlic and onions.  Horseradish.  Spicy and acidic foods, including peppers, chili powder, curry powder, vinegar, hot sauces, and barbecue sauce.  Citrus fruit juices and citrus fruits, such as oranges, lemons, and limes.  Tomato-based foods, such as red sauce, chili, salsa, and pizza with red sauce.  Fried and fatty foods, such  as donuts, french fries, potato chips, and high-fat dressings.  High-fat meats, such as hot dogs and fatty cuts of red and white meats, such as rib eye steak, sausage, ham, and bacon.  High-fat dairy items, such as whole milk, butter, and cream cheese.  Eat small, frequent meals instead of large meals.  Avoid drinking large amounts of liquid with your meals.  Avoid eating meals during the 2-3 hours before bedtime.  Avoid lying down right after you eat.  Do not exercise right after you eat. General instructions  Pay attention to any changes in your symptoms.  Take over-the-counter and prescription medicines only as told by your health care provider. Do not take aspirin, ibuprofen, or other NSAIDs unless your health care provider told you to do so.  Do not use any tobacco products, including cigarettes, chewing tobacco, and e-cigarettes. If you need help quitting, ask your health care provider.  Wear loose-fitting clothing. Do not wear anything tight around your waist that causes pressure on your abdomen.  Raise (elevate) the head of your bed 6 inches (15cm).  Try to reduce your stress, such as with yoga or meditation. If you need help reducing stress, ask your health care provider.  If you are overweight, reduce your weight to an amount that is healthy for you. Ask your health care provider for guidance about a safe weight loss goal.  Keep all follow-up visits as told by your health care provider. This is important. Contact a health care provider if:  You have new symptoms.  You have unexplained weight loss.  You have difficulty swallowing, or it hurts to swallow.  You have wheezing or a persistent cough.  Your symptoms do not improve with treatment.  You have a hoarse voice. Get help right away if:  You have pain in your arms, neck, jaw, teeth, or back.  You feel sweaty, dizzy, or light-headed.  You have chest pain or shortness of breath.  You vomit and your vomit  looks like blood or coffee grounds.  You faint.  Your stool is bloody or black.  You cannot swallow, drink, or eat. This information is not intended to replace advice given to you by your health care provider. Make sure you discuss any questions you have with your health care provider. Document Released: 12/28/2004 Document Revised: 08/18/2015 Document Reviewed: 07/15/2014 Elsevier Interactive Patient Education  2017 [place].    Colon Polyps Introduction Polyps are tissue growths inside the body. Polyps can grow in many places, including the large intestine (colon). A polyp may be a round bump or a mushroom-shaped growth. You could have one polyp or several. Most colon polyps are  noncancerous (benign). However, some colon polyps can become cancerous over time. What are the causes? The exact cause of colon polyps is not known. What increases the risk? This condition is more likely to develop in people who:  Have a family history of colon cancer or colon polyps.  Are older than 79 or older than 45 if they are African American.  Have inflammatory bowel disease, such as ulcerative colitis or Crohn disease.  Are overweight.  Smoke cigarettes.  Do not get enough exercise.  Drink too much alcohol.  Eat a diet that is:  High in fat and red meat.  Low in fiber.  Had childhood cancer that was treated with abdominal radiation. What are the signs or symptoms? Most polyps do not cause symptoms. If you have symptoms, they may include:  Blood coming from your rectum when having a bowel movement.  Blood in your stool.The stool may look dark red or black.  A change in bowel habits, such as constipation or diarrhea. How is this diagnosed? This condition is diagnosed with a colonoscopy. This is a procedure that uses a lighted, flexible scope to look at the inside of your colon. How is this treated? Treatment for this condition involves removing any polyps that are found.  Those polyps will then be tested for cancer. If cancer is found, your health care provider will talk to you about options for colon cancer treatment. Follow these instructions at home: Diet  Eat plenty of fiber, such as fruits, vegetables, and whole grains.  Eat foods that are high in calcium and vitamin D, such as milk, cheese, yogurt, eggs, liver, fish, and broccoli.  Limit foods high in fat, red meats, and processed meats, such as hot dogs, sausage, bacon, and lunch meats.  Maintain a healthy weight, or lose weight if recommended by your health care provider. General instructions  Do not smoke cigarettes.  Do not drink alcohol excessively.  Keep all follow-up visits as told by your health care provider. This is important. This includes keeping regularly scheduled colonoscopies. Talk to your health care provider about when you need a colonoscopy.  Exercise every day or as told by your health care provider. Contact a health care provider if:  You have new or worsening bleeding during a bowel movement.  You have new or increased blood in your stool.  You have a change in bowel habits.  You unexpectedly lose weight. This information is not intended to replace advice given to you by your health care provider. Make sure you discuss any questions you have with your health care provider. Document Released: 12/15/2003 Document Revised: 08/26/2015 Document Reviewed: 02/08/2015  2017 [employer]vier

## 2016-04-27 NOTE — Op Note (Signed)
[facility] Patient Name: [name] Procedure Date: 04/27/2016 8:25 AM MRN: [id number] Date of Birth: 08-Jan-1946 Attending MD: Norvel Richards , MD CSN: [id number] Age: 71 Admit Type: Outpatient Procedure:                Colonoscopy Indications:              High risk colon cancer surveillance: Personal                            history of colonic polyps Providers:                Norvel Richards, MD, Lurline Del, RN, [facility], Technician Referring MD:              Medicines:                Propofol per Anesthesia Complications:            No immediate complications. Estimated Blood Loss:     Estimated blood loss was minimal. Procedure:                Pre-Anesthesia Assessment:                           - Prior to the procedure, a History and Physical                            was performed, and patient medications and                            allergies were reviewed. The patient's tolerance of                            previous anesthesia was also reviewed. The risks                            and benefits of the procedure and the sedation                            options and risks were discussed with the patient.                            All questions were answered, and informed consent                            was obtained. Prior Anticoagulants: The patient has                            taken no previous anticoagulant or antiplatelet                            agents. ASA Grade Assessment: III - A patient with  severe systemic disease. After reviewing the risks                            and benefits, the patient was deemed in                            satisfactory condition to undergo the procedure.                           After obtaining informed consent, the colonoscope                            was passed under direct vision. Throughout the                            procedure, the  patient's blood pressure, pulse, and                            oxygen saturations were monitored continuously. The                            EC-[id number] ([id number]) scope was introduced through                            the and advanced to the the cecum, identified by                            appendiceal orifice and ileocecal valve. The entire                            colon was well visualized. The patient tolerated                            the procedure well. The quality of the bowel                            preparation was adequate. The ileocecal valve,                            appendiceal orifice, and rectum were photographed. Scope In: 8:28:35 AM Scope Out: 8:53:03 AM Scope Withdrawal Time: 0 hours 19 minutes 56 seconds  Total Procedure Duration: 0 hours 24 minutes 28 seconds  Findings:      The perianal and digital rectal examinations were normal.      Eight semi-pedunculated polyps were found in the descending colon,       splenic flexure, ascending colon and cecum. The polyps were 4 to 8 mm in       size. These polyps were removed with a hot and cold snare technique.       Resection and retrieval were complete. Estimated blood loss was minimal.       The remainder of the colon appeared normal. Patient did have internal       hemorrhoids and grade 1 and multiple anal pap Impression:               -  Eight 4 to 8 mm polyps in the descending colon,                            at the splenic flexure, in the ascending colon and                            in the cecum, removed with snare. Resected and                            retrieved. Anal papilla and internal hemorrhoids Moderate Sedation:      Moderate (conscious) sedation was personally administered by an       anesthesia professional. The following parameters were monitored: oxygen       saturation, heart rate, blood pressure, respiratory rate, EKG, adequacy       of pulmonary ventilation, and response to care. Total  physician       intraservice time was 50 minutes. Recommendation:           - Patient has a contact number available for                            emergencies. The signs and symptoms of potential                            delayed complications were discussed with the                            patient. Return to normal activities tomorrow.                            Written discharge instructions were provided to the                            patient.                           - Patient has a contact number available for                            emergencies. The signs and symptoms of potential                            delayed complications were discussed with the                            patient. Return to normal activities tomorrow.                            Written discharge instructions were provided to the                            patient.                           - Resume previous diet.                           -  Continue present medications.                           - Repeat colonoscopy date to be determined after                            pending pathology results are reviewed for                            surveillance.                           - Return to GI office in 3 months. Procedure Code(s):        --- Professional ---                           [phone], Colonoscopy, flexible; with removal of                            tumor(s), polyp(s), or other lesion(s) by snare                            technique Diagnosis Code(s):        --- Professional ---                           Z86.010, Personal history of colonic polyps                           D12.4, Benign neoplasm of descending colon                           D12.3, Benign neoplasm of transverse colon (hepatic                            flexure or splenic flexure)                           D12.2, Benign neoplasm of ascending colon                           D12.0, Benign neoplasm of cecum CPT copyright 2016  American Medical Association. All rights reserved. The codes documented in this report are preliminary and upon coder review may  be revised to meet current compliance requirements. [name]. Danikah Budzik, MD Norvel Richards, MD 04/27/2016 9:06:12 AM This report has been signed electronically. Number of Addenda: 0

## 2016-04-27 NOTE — H&P (View-Only) (Signed)
Primary Care Physician:  Worthy Rancher, MD Primary Gastroenterologist:  Dr. Gala Romney  Pre-Procedure History & Physical: HPI:  [name] is a 71 y.o. male here for three-week history of intermittent dysphagia. Patient reports that for about 3 weeks up until time of Thanksgiving last year patient had difficulties with swallowing solid food. He describes getting a food bolus stuck behind his lower breast spine and having to wait until it passed before resuming eating. Denies any similar symptoms before this episode nor has he had any since that time.  He does take Protonix 40 mg twice daily which controls his reflux symptoms very well. Patient denies any prior EGD.  The patient's history of multiple colonic adenomas removed by me in 2010; before that, had multiple colon polyps removed in [place]. He is overdue for surveillance examination. Since I last saw him, he was diagnosed with metastatic prostate cancer. Status post radiation therapy. Currently, does not have any rectal bleeding, melena diarrhea or constipation. He denies abdominal pain; he has any nausea or vomiting. He is a long-term smoker. With steroids, he tells me he's gained about 100 pounds in the past 1 year.  He remembers "waking up" when one of the polyps was being removed in 2010.  He has sleep apnea. Intolerant of wearing a mask. He is a long-term smoker and former consumer of alcohol.  Past Medical History:  Diagnosis Date  . Aortic atherosclerosis (HCC)    Penetrating aortic ulcer s/p endograft 2004 - followed by Dr. Trula Slade  . Arthritis   . COPD (chronic obstructive pulmonary disease) ([place])   . Coronary atherosclerosis of native coronary artery    Mild atherosclerosis at catheterization 2011  . Essential hypertension, benign   . GERD (gastroesophageal reflux disease)   . Gout   . History of kidney stones   . Neuropathy (HCC)    TOES  . Prostate cancer ([place])    radiation tx  . Shortness of breath  dyspnea    with exertion  . Sleep apnea    Intolerant of CPAP  . Tubular adenoma of colon 08/2008    Past Surgical History:  Procedure Laterality Date  . Aortic endograft repair  2004   Dr. [name]  . CHEST TUBE INSERTION  1989   "collapsed lung"due to injury  . CIRCUMCISION  09/26/2011   Procedure: CIRCUMCISION ADULT;  Surgeon: [name], MD;  Location: [facility];  Service: Urology;  Laterality: N/A;  . COLONOSCOPY W/ POLYPECTOMY    . CYSTOSCOPY  09/26/2011   Procedure: CYSTOSCOPY;  Surgeon: [name], MD;  Location: [facility];  Service: Urology;  Laterality: N/A;  . ENDOVASCULAR STENT INSERTION  September 13, 2007   EVAR - Aortic stent graft  . EVAR    . FRACTURE SURGERY     Bilateral lower arms as a child  . KNEE ARTHROSCOPY     Left  . KNEE SURGERY  Sept. 26, 2013   Torn minisc.- Right  knee  . PARTIAL KNEE ARTHROPLASTY Right 04/26/2015   Procedure: RIGHT KNEE MEDIAL UNICOMPARTMENTAL ARTHROPLASTY;  Surgeon: Gaynelle Arabian, MD;  Location: [facility];  Service: Orthopedics;  Laterality: Right;  . PROSTATE BIOPSY  09/26/2011   Procedure: BIOPSY TRANSRECTAL ULTRASONIC PROSTATE (TUBP);  Surgeon: [name], MD;  Location: [facility];  Service: Urology;  Laterality: N/A;     . UMBILICAL HERNIA REPAIR      Prior to Admission medications   Medication Sig Start Date End Date Taking? Authorizing  Provider  albuterol (PROVENTIL HFA;VENTOLIN HFA) 108 (90 Base) MCG/ACT inhaler Inhale 2 puffs into the lungs every 6 (six) hours as needed for wheezing or shortness of breath. 09/08/15  Yes [name] [name], MD  allopurinol (ZYLOPRIM) 300 MG tablet TAKE ONE TABLET BY MOUTH ONCE DAILY 12/23/15  Yes [name] [name], MD  amitriptyline (ELAVIL) 50 MG tablet Take 1 tablet (50 mg total) by mouth at bedtime. 02/15/16  Yes Mary-Margaret Hassell Done, FNP  amLODipine (NORVASC) 10 MG tablet Take 1 tablet (10 mg total) by mouth daily. 02/15/16  Yes Mary-Margaret Hassell Done, FNP  carvedilol (COREG) 25 MG tablet Take 25 mg by  mouth 2 (two) times daily. 11/29/15  Yes Historical Provider, MD  losartan (COZAAR) 50 MG tablet TAKE ONE TABLET BY MOUTH ONCE DAILY 03/17/16  Yes Mary-Margaret Hassell Done, FNP  mirabegron ER (MYRBETRIQ) 25 MG TB24 tablet Take 25 mg by mouth daily.   Yes Historical Provider, MD  pantoprazole (PROTONIX) 40 MG tablet Take 1 tablet (40 mg total) by mouth 2 (two) times daily. 06/07/15  Yes Eustaquio Maize, MD  VESICARE 5 MG tablet 5 mg daily.  01/24/16  Yes Historical Provider, MD  benzonatate (TESSALON PERLES) 100 MG capsule Take 1 capsule (100 mg total) by mouth 3 (three) times daily as needed for cough. Patient not taking: Reported on 04/04/2016 02/15/16   Mary-Margaret Hassell Done, FNP  Fluticasone-Salmeterol (ADVAIR) 100-50 MCG/DOSE AEPB Inhale 1 puff into the lungs 2 (two) times daily. Patient not taking: Reported on 04/04/2016 02/04/16   Mary-Margaret Hassell Done, FNP    Allergies as of 04/04/2016 - Review Complete 04/04/2016  Allergen Reaction Noted  . Carbidopa-levodopa Other (See Comments) 01/14/2013  . Morphine sulfate Nausea And Vomiting 09/28/2008  . Sulfacetamide sodium Nausea And Vomiting 01/31/2007  . Sulfamethoxazole-trimethoprim Nausea And Vomiting 07/01/2008    Family History  Problem Relation Age of Onset  . Stroke Mother   . Mesothelioma Father   . Cancer Father   . Hyperlipidemia Sister   . Heart attack Sister   . Cancer Brother 66  . Heart disease Brother   . Hyperlipidemia Brother     Social History   Social History  . Marital status: Married    Spouse name: N/A  . Number of children: N/A  . Years of education: N/A   Occupational History  . Not on file.   Social History Main Topics  . Smoking status: Current Every Day Smoker    Packs/day: 0.50    Years: 42.00    Types: Cigarettes  . Smokeless tobacco: Never Used     Comment: 12 ciggs per day  . Alcohol use No  . Drug use: No  . Sexual activity: Not on file   Other Topics Concern  . Not on file   Social History  Narrative  . No narrative on file    Review of Systems: See HPI, otherwise negative ROS  Physical Exam: BP (!) 157/105   Pulse (!) 102   Temp 97.4 F (36.3 C) (Oral)   Ht 5\' 8"  (1.727 m)   Wt 285 lb 6.4 oz (129.5 kg)   BMI 43.39 kg/m  General:   Alert,  Well-developed, well-nourished, pleasant and cooperative in NAD Skin:  Intact without significant lesions or rashes. Mouth:  No deformity or lesions. Neck:  Supple; no masses or thyromegaly. No significant cervical adenopathy. Lungs:  Clear throughout to auscultation.   No wheezes, crackles, or rhonchi. No acute distress. Heart:  Regular rate and rhythm; no murmurs, clicks, rubs,  or gallops. Abdomen: Non-distended, normal bowel sounds.  Soft and nontender without appreciable mass or hepatosplenomegaly.  Pulses:  Normal pulses noted. Extremities:  Without clubbing or edema.  Impression:  Pleasant 71 year old, morbidly obese, gentleman with a 3 week history of perceived esophageal dysphagia to solids. Has long-standing GERD well controlled this time on acid suppression therapy. Self limiting dysphagia a little interesting. He was in [facility] on vacation during this period of time and reports eating very well. If he had a fixed structural lesion, would expect dysphagia to be steadily intermittent and possibly progressive. His symptoms seem to be well-defined over time.  He has long-standing reflux symptoms. Further evaluation warranted.   History of multiple colonic adenomas removed over time. He is overdue for surveillance colonoscopy.  Recommendations:  I've offered the patient a surveillance colonoscopy in the near future.The risks, benefits, limitations, alternatives and imponderables have been reviewed with the patient. Questions have been answered. All parties are agreeable.   I have also offered the patient either a barium pill esophagram or go directly to EGD to further evaluate his recent dysphagia symptoms. After some  discussion, patient has opted to proceed directly with the EGD at the same time as colonoscopy.The risks, benefits, limitations, alternatives and imponderables have been reviewed with the patient. Potential for esophageal dilation, biopsy, etc. have also been reviewed.  Questions have been answered. All parties agreeable.  Because of his comorbidities and his poor experience with his  prior colonoscopy, will utilize  anesthesia for deeper sedation with propofol.  Further recommendations to follow.      Notice: This dictation was prepared with Dragon dictation along with smaller phrase technology. Any transcriptional errors that result from this process are unintentional and may not be corrected upon review.

## 2016-04-27 NOTE — Anesthesia Postprocedure Evaluation (Signed)
Anesthesia Post Note  Patient: [name]  Procedure(s) Performed: Procedure(s) (LRB): COLONOSCOPY WITH PROPOFOL (N/A) ESOPHAGOGASTRODUODENOSCOPY (EGD) WITH PROPOFOL (N/A) MALONEY DILATION (N/A) POLYPECTOMY  Patient location during evaluation: PACU Anesthesia Type: MAC Level of consciousness: awake, oriented and patient cooperative Pain management: pain level controlled Vital Signs Assessment: post-procedure vital signs reviewed and stable Respiratory status: spontaneous breathing, nonlabored ventilation and respiratory function stable Cardiovascular status: blood pressure returned to baseline Postop Assessment: no signs of nausea or vomiting Anesthetic complications: no     Last Vitals:  Vitals:   04/27/16 0755 04/27/16 0902  BP: 137/84 (!) 143/93  Pulse:  64  Resp: 16 18  Temp:  37.1 C    Last Pain:  Vitals:   04/27/16 0717  TempSrc: Oral                 Dalary Hollar J

## 2016-04-28 ENCOUNTER — Encounter: Payer: Self-pay | Admitting: Internal Medicine

## 2016-05-03 ENCOUNTER — Encounter (HOSPITAL_COMMUNITY): Payer: Self-pay | Admitting: Internal Medicine

## 2016-06-01 ENCOUNTER — Telehealth: Payer: Self-pay | Admitting: Surgery

## 2016-06-01 NOTE — Telephone Encounter (Signed)
-----   Message from Serafina Mitchell, MD sent at 06/01/2016  2:45 PM EST ----- Regarding: RE: cta consult He had a CT in 2017 that is appropriate.  Does not need another one ----- Message ----- From: Georgiann Mccoy Sent: 06/01/2016  12:13 PM To: Serafina Mitchell, MD Subject: cta consult                                    Hi Dr. Trula Slade,  This pt requested their CT to be done at [facility]. The schedulers there say you need to talk to the tech before they will schedule the CT.  The phone number is [phone]  Thank you, Selinda Eon

## 2016-06-07 ENCOUNTER — Encounter: Payer: Self-pay | Admitting: Internal Medicine

## 2016-06-08 DIAGNOSIS — L821 Other seborrheic keratosis: Secondary | ICD-10-CM | POA: Diagnosis not present

## 2016-06-08 DIAGNOSIS — B078 Other viral warts: Secondary | ICD-10-CM | POA: Diagnosis not present

## 2016-06-09 ENCOUNTER — Other Ambulatory Visit: Payer: Self-pay | Admitting: Nurse Practitioner

## 2016-06-19 DIAGNOSIS — C61 Malignant neoplasm of prostate: Secondary | ICD-10-CM | POA: Diagnosis not present

## 2016-06-22 ENCOUNTER — Other Ambulatory Visit (HOSPITAL_COMMUNITY): Payer: Self-pay | Admitting: Orthopedic Surgery

## 2016-06-22 DIAGNOSIS — Z8546 Personal history of malignant neoplasm of prostate: Secondary | ICD-10-CM

## 2016-06-22 DIAGNOSIS — M1612 Unilateral primary osteoarthritis, left hip: Secondary | ICD-10-CM | POA: Diagnosis not present

## 2016-06-22 DIAGNOSIS — G8929 Other chronic pain: Secondary | ICD-10-CM | POA: Diagnosis not present

## 2016-06-22 DIAGNOSIS — M25562 Pain in left knee: Secondary | ICD-10-CM | POA: Diagnosis not present

## 2016-06-23 ENCOUNTER — Ambulatory Visit (INDEPENDENT_AMBULATORY_CARE_PROVIDER_SITE_OTHER): Payer: Medicare Other | Admitting: Urology

## 2016-06-23 DIAGNOSIS — C61 Malignant neoplasm of prostate: Secondary | ICD-10-CM | POA: Diagnosis not present

## 2016-06-23 DIAGNOSIS — N3281 Overactive bladder: Secondary | ICD-10-CM | POA: Diagnosis not present

## 2016-06-23 DIAGNOSIS — R9721 Rising PSA following treatment for malignant neoplasm of prostate: Secondary | ICD-10-CM

## 2016-06-26 ENCOUNTER — Ambulatory Visit (INDEPENDENT_AMBULATORY_CARE_PROVIDER_SITE_OTHER): Payer: Medicare Other | Admitting: Family Medicine

## 2016-06-26 ENCOUNTER — Encounter: Payer: Self-pay | Admitting: Family Medicine

## 2016-06-26 VITALS — BP 138/84 | HR 76 | Temp 97.6°F | Ht 68.0 in | Wt 294.0 lb

## 2016-06-26 DIAGNOSIS — R7303 Prediabetes: Secondary | ICD-10-CM

## 2016-06-26 DIAGNOSIS — J441 Chronic obstructive pulmonary disease with (acute) exacerbation: Secondary | ICD-10-CM

## 2016-06-26 DIAGNOSIS — I10 Essential (primary) hypertension: Secondary | ICD-10-CM

## 2016-06-26 DIAGNOSIS — G6289 Other specified polyneuropathies: Secondary | ICD-10-CM

## 2016-06-26 LAB — BAYER DCA HB A1C WAIVED: HB A1C (BAYER DCA - WAIVED): 5.9 % (ref <7.0)

## 2016-06-26 MED ORDER — PREDNISONE 20 MG PO TABS: ORAL_TABLET | ORAL | 0 refills | Status: DC

## 2016-06-26 MED ORDER — AZITHROMYCIN 250 MG PO TABS: ORAL_TABLET | ORAL | 0 refills | Status: DC

## 2016-06-26 MED ORDER — DULOXETINE HCL 30 MG PO CPEP: 30.0000 mg | ORAL_CAPSULE | Freq: Every day | ORAL | 1 refills | Status: DC

## 2016-06-26 NOTE — Progress Notes (Signed)
BP 138/84   Pulse 76   Temp 97.6 F (36.4 C) (Oral)   Ht 5\' 8"  (1.727 m)   Wt 294 lb (133.4 kg)   BMI 44.70 kg/m    Subjective:    Patient ID: [name], male    DOB: 17-Dec-1945, 71 y.o.   MRN: [id number]  HPI: [name] is a 71 y.o. male presenting on 06/26/2016 for Medication Refill and Cough (x 1 month, able to cough up phlegm)   HPI COPD recheck Patient is coming in for recheck of his COPD. He has been having a cough is been persistent for 1 month. He has been using his albuterol inhalers 2-3 times daily. He denies any shortness of breath today in the office but he has been having a lot of shortness of breath and wheezing and coughing spells especially at night more than during the day. He continues to smoke half a pack per day and has a 21-pack-year history at least. He denies any fevers or chills.  Prediabetes recheck Patient had elevated blood sugars and is coming in for a recheck of his prediabetes. He is currently not on any medication. He is currently on an angiotensin receptor blocker. He has not seen an ophthalmologist for an exam but will go do that. He denies any issues with his feet.  Hypertension recheck Patient is coming in for blood pressure recheck. His blood pressure today is 138/84. He is currently on losartan. Patient denies headaches, blurred vision, chest pains, shortness of breath, or weakness. Denies any side effects from medication and is content with current medication.   Neuropathy and nerve pain Patient has bilateral nerve pain in his feet and hence his arms sometimes. He has tried medications for this previously but they have not been successful. He would like to try something different this time. The last medication that we tried with amitriptyline and he did not feel like that worked well for him. He could not wake up the next day because it made him so groggy.  Relevant past medical, surgical, family and social history reviewed and  updated as indicated. Interim medical history since our last visit reviewed. Allergies and medications reviewed and updated.  Review of Systems  Constitutional: Negative for chills and fever.  HENT: Positive for congestion.   Respiratory: Positive for cough, shortness of breath and wheezing.   Cardiovascular: Negative for chest pain and leg swelling.  Musculoskeletal: Negative for back pain and gait problem.  Skin: Negative for rash.  Neurological: Positive for numbness. Negative for dizziness, weakness and light-headedness.  All other systems reviewed and are negative.   Per HPI unless specifically indicated above      Objective:    BP 138/84   Pulse 76   Temp 97.6 F (36.4 C) (Oral)   Ht 5\' 8"  (1.727 m)   Wt 294 lb (133.4 kg)   BMI 44.70 kg/m   Wt Readings from Last 3 Encounters:  06/26/16 294 lb (133.4 kg)  04/27/16 283 lb (128.4 kg)  04/25/16 283 lb (128.4 kg)    Physical Exam  Constitutional: He is oriented to person, place, and time. He appears well-developed and well-nourished. No distress.  HENT:  Right Ear: External ear normal.  Left Ear: External ear normal.  Nose: Nose normal.  Mouth/Throat: Oropharynx is clear and moist.  Eyes: Conjunctivae are normal. No scleral icterus.  Cardiovascular: Normal rate, regular rhythm, normal heart sounds and intact distal pulses.   No murmur heard. Pulmonary/Chest: Effort  normal. No respiratory distress. He has wheezes. He has no rales.  Musculoskeletal: Normal range of motion. He exhibits no edema.  Neurological: He is alert and oriented to person, place, and time. A sensory deficit (Some component of numbness in his hands and his feet.) is present. Coordination normal.  Skin: Skin is warm and dry. No rash noted. He is not diaphoretic.  Psychiatric: He has a normal mood and affect. His behavior is normal.  Nursing note and vitals reviewed.     Assessment & Plan:   Problem List Items Addressed This Visit       Cardiovascular and Mediastinum   Essential hypertension, benign     Other   Prediabetes   Relevant Orders   Bayer DCA Hb A1c Waived (Completed)    Other Visit Diagnoses    COPD with acute exacerbation ([place])    -  Primary   Relevant Medications   azithromycin (ZITHROMAX) 250 MG tablet   predniSONE (DELTASONE) 20 MG tablet   Other polyneuropathy (HCC)       Relevant Medications   DULoxetine (CYMBALTA) 30 MG capsule       Follow up plan: Return in about 3 months (around 09/26/2016), or if symptoms worsen or fail to improve, for Recheck hypertension and diabetes.  Counseling provided for all of the vaccine components Orders Placed This Encounter  Procedures  . Bayer [facility] Hb A1c [place], MD [facility] 06/26/2016, 3:04 PM

## 2016-06-27 ENCOUNTER — Encounter (HOSPITAL_COMMUNITY)
Admission: RE | Admit: 2016-06-27 | Discharge: 2016-06-27 | Disposition: A | Discharge status: Home / Self Care | Payer: Medicare Other | Source: Ambulatory Visit | Attending: Orthopedic Surgery | Admitting: Orthopedic Surgery

## 2016-06-27 ENCOUNTER — Encounter (HOSPITAL_COMMUNITY): Payer: Self-pay

## 2016-06-27 DIAGNOSIS — R972 Elevated prostate specific antigen [PSA]: Secondary | ICD-10-CM | POA: Diagnosis not present

## 2016-06-27 DIAGNOSIS — M1612 Unilateral primary osteoarthritis, left hip: Secondary | ICD-10-CM

## 2016-06-27 DIAGNOSIS — Z9181 History of falling: Secondary | ICD-10-CM | POA: Diagnosis not present

## 2016-06-27 DIAGNOSIS — M25552 Pain in left hip: Secondary | ICD-10-CM | POA: Insufficient documentation

## 2016-06-27 DIAGNOSIS — Z8546 Personal history of malignant neoplasm of prostate: Secondary | ICD-10-CM | POA: Insufficient documentation

## 2016-06-27 MED ORDER — TECHNETIUM TC 99M MEDRONATE IV KIT
25.0000 | PACK | Freq: Once | INTRAVENOUS | Status: AC | PRN
  Administered 2016-06-27: 21.1 via INTRAVENOUS

## 2016-07-02 ENCOUNTER — Other Ambulatory Visit: Payer: Self-pay | Admitting: Pediatrics

## 2016-07-06 ENCOUNTER — Telehealth: Payer: Self-pay | Admitting: Family Medicine

## 2016-07-06 NOTE — Telephone Encounter (Signed)
I do not think this is the duloxetine that's causing this, duloxetine is not known to cause slowing of the urine and actually usually increases it if anything. Please have him continue on the medication but also take some MiraLAX once a day to help him have a good normal bowel movement and see if that clears everything out for him. It sounds like he is constipated

## 2016-07-06 NOTE — Telephone Encounter (Signed)
Pt states he has noticed his urine stream isn't as strong and when he voids it feels like he needs to have a BM. He thinks it is r/t the duloxetine. Please advise.

## 2016-07-06 NOTE — Telephone Encounter (Signed)
Patient aware of recommendation and verbalizes understanding. 

## 2016-07-09 ENCOUNTER — Other Ambulatory Visit: Payer: Self-pay | Admitting: Family Medicine

## 2016-07-10 ENCOUNTER — Encounter: Payer: Self-pay | Admitting: Nurse Practitioner

## 2016-07-10 ENCOUNTER — Ambulatory Visit (INDEPENDENT_AMBULATORY_CARE_PROVIDER_SITE_OTHER): Payer: Medicare Other | Admitting: Nurse Practitioner

## 2016-07-10 VITALS — BP 153/90 | HR 74 | Temp 97.5°F | Ht 68.0 in | Wt 289.2 lb

## 2016-07-10 DIAGNOSIS — K219 Gastro-esophageal reflux disease without esophagitis: Secondary | ICD-10-CM | POA: Diagnosis not present

## 2016-07-10 DIAGNOSIS — R197 Diarrhea, unspecified: Secondary | ICD-10-CM | POA: Insufficient documentation

## 2016-07-10 DIAGNOSIS — R1310 Dysphagia, unspecified: Secondary | ICD-10-CM | POA: Diagnosis not present

## 2016-07-10 NOTE — Progress Notes (Signed)
CC'D TO PCP °

## 2016-07-10 NOTE — Assessment & Plan Note (Signed)
Dysphasia significantly improved/essentially resolved after upper endoscopy with dilation. No further symptoms at this time. Continue PPI, return for follow-up as needed for any recurrent symptoms.

## 2016-07-10 NOTE — Assessment & Plan Note (Signed)
GERD symptoms currently well-controlled on PPI. Continue medications, return for follow-up as needed.

## 2016-07-10 NOTE — Assessment & Plan Note (Addendum)
Diarrhea started 5 days ago, which was 2-3 months after his procedure. Unlikely adverse event from colonoscopy. 3 weeks ago he was put on azithromycin for cold and cough type symptoms. Likely antibiotic associated diarrhea. I will have stool studies drawn to make sure he has not developed C. difficile or other significant infection. After he provide stool studies he can start probiotics for 30 days and we will provide samples. If his stool studies are negative and he continues to have diarrhea we can send in an antidiarrheal medication and make other recommendations. Continue adequate hydration. No antidiarrheal medication for 24 hours prior to providing samples. Return for follow-up in 2 months.

## 2016-07-10 NOTE — Patient Instructions (Signed)
1. Take a probiotic for 30 days to see if this helps her diarrhea symptoms. 2. I'm giving the samples that we have on hand to last for 10 days. I am also giving you a coupon to help decrease your costs to continue over-the-counter probiotic for 30 days.  3. Collect your stool studies with your next loose stool. 4. Bring stool samples to the lab, not our office. 5. We will call you with the results. 6. If when we receive the results there is no colon infection and you're continuing with diarrhea we can send in a prescription to help. 7. Return for follow-up in 2 months.

## 2016-07-10 NOTE — Progress Notes (Signed)
Referring Provider: [name], [name], MD Primary Care Physician:  [name] [name], MD Primary GI:  Dr. Gala Romney  Chief Complaint  Patient presents with  . Diarrhea    approx 7 times/day; takes Pepto Bismol; "has to have BM when urinates"  . Dysphagia    pp f/u; better    HPI:   [name] is a 71 y.o. male who presents for 3 month post procedure follow-up. The patient was last seen in our office 04/04/2016 for GERD, dysphagia, history of colon polyps. At that time he noted three-week history of perceived dysphasia, long-standing GERD well-controlled at that time with PPI. History of multiple colonic adenomas and was overdue for surveillance at that time. Recommended surveillance colonoscopy and barium pill esophagram versus EGD with possible dilation.  EGD completed 04/27/2016 which noted normal esophagus status post dilation, small hiatal hernia, normal duodenum, no specimens collected. Colonoscopy completed the same day found eight 4-8 mm polyps in the descending colon, splenic flexure, ascending colon, cecum all removed. Anal papilla and internal hemorrhoids. Surgical pathology found the polyps to be tubular adenoma. Recommended return to office for follow-up in 3 months. Continue current medications. Surveillance colonoscopy in 3 years.  Today he states he's doing ok. Procedures went well. No further dysphagia symptoms. He notes that he has diarrhea now, has urgency. Started 5 days ago, 7 bowel movements yesterday. Watery, pepto-like medicine helped. Denies hematochezia or melena. Denies abdominal pain, N/V, sick contacts, no new foods or travel. Is drinking plenty of water to stay hydrated. Denies chest pain, dyspnea, dizziness, lightheadedness, syncope, near syncope. Denies any other upper or lower GI symptoms.  Was on ZPak 3 weeks ago for cold/cough symptoms.  Past Medical History:  Diagnosis Date  . Aortic atherosclerosis (HCC)    Penetrating aortic ulcer s/p endograft  2004 - followed by Dr. Trula Slade  . Arthritis   . COPD (chronic obstructive pulmonary disease) ([place])   . Coronary atherosclerosis of native coronary artery    Mild atherosclerosis at catheterization 2011  . Essential hypertension, benign   . GERD (gastroesophageal reflux disease)   . Gout   . History of kidney stones   . Neuropathy (HCC)    TOES  . Prostate cancer ([place])    radiation tx  . Shortness of breath dyspnea    with exertion  . Sleep apnea    Intolerant of CPAP  . Tubular adenoma of colon 08/2008    Past Surgical History:  Procedure Laterality Date  . Aortic endograft repair  2004   Dr. [name]  . CHEST TUBE INSERTION  1989   "collapsed lung"due to injury  . CIRCUMCISION  09/26/2011   Procedure: CIRCUMCISION ADULT;  Surgeon: [name], MD;  [facility];  Service: Urology;  Laterality: N/A;  . COLONOSCOPY W/ POLYPECTOMY    . COLONOSCOPY WITH PROPOFOL N/A 04/27/2016   Procedure: COLONOSCOPY WITH PROPOFOL;  Surgeon: [name], MD;  [facility];  Service: Endoscopy;  Laterality: N/A;  1115-moved to 845 per Ginger  . CYSTOSCOPY  09/26/2011   Procedure: CYSTOSCOPY;  Surgeon: [name], MD;  [facility];  Service: Urology;  Laterality: N/A;  . ENDOVASCULAR STENT INSERTION  September 13, 2007   EVAR - Aortic stent graft  . ESOPHAGOGASTRODUODENOSCOPY (EGD) WITH PROPOFOL N/A 04/27/2016   Procedure: ESOPHAGOGASTRODUODENOSCOPY (EGD) WITH PROPOFOL;  Surgeon: [name], MD;  [facility];  Service: Endoscopy;  Laterality: N/A;  . EVAR    .  FRACTURE SURGERY     Bilateral lower arms as a child  . KNEE ARTHROSCOPY     Left  . KNEE SURGERY  Sept. 26, 2013   Torn minisc.- Right  knee  . MALONEY DILATION N/A 04/27/2016   Procedure: Venia Minks DILATION;  Surgeon: [name], MD;  [facility];  Service: Endoscopy;  Laterality: N/A;  . PARTIAL KNEE ARTHROPLASTY Right 04/26/2015   Procedure: RIGHT KNEE MEDIAL UNICOMPARTMENTAL ARTHROPLASTY;   Surgeon: Gaynelle Arabian, MD;  [facility];  Service: Orthopedics;  Laterality: Right;  . POLYPECTOMY  04/27/2016   Procedure: POLYPECTOMY;  Surgeon: [name], MD;  [facility];  Service: Endoscopy;;  cecal , ascending, descending, and sigmoid polypectomies  . PROSTATE BIOPSY  09/26/2011   Procedure: BIOPSY TRANSRECTAL ULTRASONIC PROSTATE (TUBP);  Surgeon: [name], MD;  [facility];  Service: Urology;  Laterality: N/A;     . UMBILICAL HERNIA REPAIR      Current Outpatient Prescriptions  Medication Sig Dispense Refill  . allopurinol (ZYLOPRIM) 300 MG tablet TAKE ONE TABLET BY MOUTH ONCE DAILY 30 tablet 5  . amLODipine (NORVASC) 10 MG tablet Take 1 tablet (10 mg total) by mouth daily. 90 tablet 1  . bismuth subsalicylate (PEPTO BISMOL) 262 MG/15ML suspension Take by mouth every 6 (six) hours as needed. "took 3/4 of teaspoon as needed"    . carvedilol (COREG) 25 MG tablet Take 25 mg by mouth 2 (two) times daily.     . Cholecalciferol (VITAMIN D PO) Take 1 capsule by mouth every other day.     . diclofenac sodium (VOLTAREN) 1 % GEL Apply 2 g topically daily as needed (knee pain).    . DULoxetine (CYMBALTA) 30 MG capsule Take 1 capsule (30 mg total) by mouth daily. 90 capsule 1  . losartan (COZAAR) 50 MG tablet TAKE ONE TABLET BY MOUTH ONCE DAILY 90 tablet 1  . mirabegron ER (MYRBETRIQ) 25 MG TB24 tablet Take 25 mg by mouth daily.    . pantoprazole (PROTONIX) 40 MG tablet TAKE ONE TABLET BY MOUTH TWICE DAILY 60 tablet 5   No current facility-administered medications for this visit.     Allergies as of 07/10/2016 - Review Complete 07/10/2016  Allergen Reaction Noted  . Carbidopa-levodopa Other (See Comments) 01/14/2013  . Morphine sulfate Nausea And Vomiting 09/28/2008  . Sulfacetamide sodium Nausea And Vomiting 01/31/2007  . Sulfamethoxazole-trimethoprim Nausea And Vomiting 07/01/2008    Family History  Problem Relation Age of Onset  . Stroke Mother   .  Mesothelioma Father   . Cancer Father   . Hyperlipidemia Sister   . Heart attack Sister   . Cancer Brother 12  . Heart disease Brother   . Hyperlipidemia Brother   . Colon cancer Neg Hx     Social History   Social History  . Marital status: Married    Spouse name: N/A  . Number of children: N/A  . Years of education: N/A   Social History Main Topics  . Smoking status: Current Every Day Smoker    Packs/day: 0.50    Years: 42.00    Types: Cigarettes  . Smokeless tobacco: Never Used     Comment: 12 ciggs per day  . Alcohol use No  . Drug use: No  . Sexual activity: Yes    Birth control/ protection: None   Other Topics Concern  . None   Social History Narrative  . None    Review of Systems:  General: Negative for anorexia, weight loss, fever, chills, fatigue, weakness. ENT: Negative for hoarseness, difficulty swallowing. CV: Negative for chest pain, angina, palpitations, peripheral edema.  Respiratory: Negative for dyspnea at rest, cough, sputum, wheezing.  GI: See history of present illness. Endo: Negative for unusual weight change.  Heme: Negative for bruising or bleeding.   Physical Exam: BP (!) 153/90   Pulse 74   Temp 97.5 F (36.4 C) (Oral)   Ht 5\' 8"  (1.727 m)   Wt 289 lb 3.2 oz (131.2 kg)   BMI 43.97 kg/m  General:   Alert and oriented. Pleasant and cooperative. Well-nourished and well-developed.  Eyes:  Without icterus, sclera clear and conjunctiva pink.  Ears:  Normal auditory acuity. Cardiovascular:  S1, S2 present without murmurs appreciated. Extremities without clubbing or edema. Respiratory:  Clear to auscultation bilaterally. No wheezes, rales, or rhonchi. No distress.  Gastrointestinal:  +BS, soft, non-tender and non-distended. No HSM noted. No guarding or rebound. No masses appreciated.  Rectal:  Deferred  Musculoskalatal:  Symmetrical without gross deformities. Neurologic:  Alert and oriented x4;  grossly normal neurologically. Psych:   Alert and cooperative. Normal mood and affect. Heme/Lymph/Immune: No excessive bruising noted.    07/10/2016 11:08 AM   Disclaimer: This note was dictated with voice recognition software. Similar sounding words can inadvertently be transcribed and may not be corrected upon review.

## 2016-07-13 ENCOUNTER — Other Ambulatory Visit: Payer: Self-pay | Admitting: Orthopedic Surgery

## 2016-07-13 DIAGNOSIS — M545 Low back pain: Secondary | ICD-10-CM

## 2016-07-25 ENCOUNTER — Other Ambulatory Visit: Payer: Medicare Other

## 2016-07-25 ENCOUNTER — Inpatient Hospital Stay
Admission: RE | Admit: 2016-07-25 | Discharge: 2016-07-25 | Disposition: A | Discharge status: Home / Self Care | Payer: Medicare Other | Source: Ambulatory Visit | Attending: Orthopedic Surgery | Admitting: Orthopedic Surgery

## 2016-08-01 ENCOUNTER — Ambulatory Visit
Admission: RE | Admit: 2016-08-01 | Discharge: 2016-08-01 | Disposition: A | Discharge status: Home / Self Care | Payer: Medicare Other | Source: Ambulatory Visit | Attending: Orthopedic Surgery | Admitting: Orthopedic Surgery

## 2016-08-01 DIAGNOSIS — M545 Low back pain: Secondary | ICD-10-CM

## 2016-08-01 DIAGNOSIS — M48061 Spinal stenosis, lumbar region without neurogenic claudication: Secondary | ICD-10-CM | POA: Diagnosis not present

## 2016-08-01 MED ORDER — MEPERIDINE HCL 100 MG/ML IJ SOLN
75.0000 mg | Freq: Once | INTRAMUSCULAR | Status: AC
  Administered 2016-08-01: 75 mg via INTRAMUSCULAR

## 2016-08-01 MED ORDER — ONDANSETRON HCL 4 MG/2ML IJ SOLN
4.0000 mg | Freq: Once | INTRAMUSCULAR | Status: AC
  Administered 2016-08-01: 4 mg via INTRAMUSCULAR

## 2016-08-01 MED ORDER — IOPAMIDOL (ISOVUE-M 200) INJECTION 41%
15.0000 mL | Freq: Once | INTRAMUSCULAR | Status: AC
  Administered 2016-08-01: 15 mL via INTRATHECAL

## 2016-08-01 MED ORDER — DIAZEPAM 5 MG PO TABS
5.0000 mg | ORAL_TABLET | Freq: Once | ORAL | Status: AC
  Administered 2016-08-01: 5 mg via ORAL

## 2016-08-01 NOTE — Discharge Instructions (Signed)

## 2016-08-01 NOTE — Progress Notes (Signed)
Pt states he has been off Cymbalta for the last 2 days.

## 2016-08-04 DIAGNOSIS — M545 Low back pain: Secondary | ICD-10-CM | POA: Diagnosis not present

## 2016-09-11 ENCOUNTER — Telehealth: Payer: Self-pay | Admitting: Nurse Practitioner

## 2016-09-11 ENCOUNTER — Encounter: Payer: Self-pay | Admitting: Nurse Practitioner

## 2016-09-11 ENCOUNTER — Encounter: Payer: Self-pay | Admitting: Family Medicine

## 2016-09-11 ENCOUNTER — Ambulatory Visit (INDEPENDENT_AMBULATORY_CARE_PROVIDER_SITE_OTHER): Payer: Medicare Other | Admitting: Family Medicine

## 2016-09-11 ENCOUNTER — Ambulatory Visit: Payer: Medicare Other | Admitting: Nurse Practitioner

## 2016-09-11 VITALS — BP 156/94 | HR 70 | Temp 97.7°F | Ht 68.0 in | Wt 284.0 lb

## 2016-09-11 DIAGNOSIS — S29012A Strain of muscle and tendon of back wall of thorax, initial encounter: Secondary | ICD-10-CM

## 2016-09-11 MED ORDER — METHOCARBAMOL 500 MG PO TABS: 500.0000 mg | ORAL_TABLET | Freq: Four times a day (QID) | ORAL | 1 refills | Status: DC

## 2016-09-11 NOTE — Telephone Encounter (Signed)
Noted  

## 2016-09-11 NOTE — Patient Instructions (Signed)

## 2016-09-11 NOTE — Telephone Encounter (Signed)
PATIENT WAS A NO SHOW AND LETTER SENT  °

## 2016-09-11 NOTE — Progress Notes (Signed)
BP (!) 156/98   Pulse 70   Temp 97.7 F (36.5 C) (Oral)   Ht 5\' 8"  (1.727 m)   Wt 284 lb (128.8 kg)   BMI 43.18 kg/m    Subjective:    Patient ID: [name], male    DOB: 1945-05-28, 71 y.o.   MRN: [id number]  HPI: [name] is a 71 y.o. male presenting on 09/11/2016 for Pain above right shoulder blade (x 6 days)   HPI Back pain near shoulder blade Patient has been having back pain near his shoulder blade for the past 6 or 7 days that he thinks may have started after he was lifting some buckets full of 5 pounds of dirt about a week ago. The pain is in that one spot and he rates it at about a 6 out of 10. The pain does not radiate anywhere else. The pain is worse with overhead range of motion and sometimes bothers him at night as well. He has taken some Aleve for which is helped a little bit but not significantly. Eyes any fevers or chills or redness or warmth or overlying skin changes.  Relevant past medical, surgical, family and social history reviewed and updated as indicated. Interim medical history since our last visit reviewed. Allergies and medications reviewed and updated.  Review of Systems  Constitutional: Negative for chills and fever.  Respiratory: Negative for shortness of breath and wheezing.   Cardiovascular: Negative for chest pain and leg swelling.  Musculoskeletal: Positive for back pain. Negative for gait problem.  Skin: Negative for color change and rash.  Neurological: Negative for dizziness.  All other systems reviewed and are negative.   Per HPI unless specifically indicated above     Objective:    BP (!) 156/98   Pulse 70   Temp 97.7 F (36.5 C) (Oral)   Ht 5\' 8"  (1.727 m)   Wt 284 lb (128.8 kg)   BMI 43.18 kg/m   Wt Readings from Last 3 Encounters:  09/11/16 284 lb (128.8 kg)  07/10/16 289 lb 3.2 oz (131.2 kg)  06/26/16 294 lb (133.4 kg)    Physical Exam  Constitutional: He is oriented to person, place, and time. He appears  well-developed and well-nourished. No distress.  Eyes: Conjunctivae are normal. No scleral icterus.  Cardiovascular: Normal rate, regular rhythm, normal heart sounds and intact distal pulses.   No murmur heard. Pulmonary/Chest: Effort normal and breath sounds normal. No respiratory distress. He has no wheezes. He has no rales.  Musculoskeletal: Normal range of motion. He exhibits no edema.       Cervical back: He exhibits tenderness and spasm. He exhibits normal range of motion, no bony tenderness and no swelling.       Back:  Neurological: He is alert and oriented to person, place, and time. Coordination normal.  Skin: Skin is warm and dry. No rash noted. He is not diaphoretic.  Psychiatric: He has a normal mood and affect. His behavior is normal.  Nursing note and vitals reviewed.       Assessment & Plan:   Problem List Items Addressed This Visit    None    Visit Diagnoses    Strain of rhomboid muscle, initial encounter    -  Primary   Right-sided, likely from lifting buckets over the weekend, will give muscle relaxer and gave exercises, if worsens will do physical therapy   Relevant Medications   methocarbamol (ROBAXIN) 500 MG tablet  Follow up plan: Return if symptoms worsen or fail to improve.  Counseling provided for all of the vaccine components No orders of the defined types were placed in this encounter.   Caryl Pina, MD [facility] 09/11/2016, 11:29 AM

## 2016-09-12 ENCOUNTER — Telehealth: Payer: Self-pay | Admitting: Family Medicine

## 2016-09-12 MED ORDER — CYCLOBENZAPRINE HCL 10 MG PO TABS: 10.0000 mg | ORAL_TABLET | Freq: Three times a day (TID) | ORAL | 0 refills | Status: DC | PRN

## 2016-09-12 NOTE — Telephone Encounter (Signed)
Left detailed message for pt regarding RX 

## 2016-09-12 NOTE — Telephone Encounter (Signed)
Robaxin changed to flexeril

## 2016-09-26 DIAGNOSIS — C61 Malignant neoplasm of prostate: Secondary | ICD-10-CM | POA: Diagnosis not present

## 2016-09-29 ENCOUNTER — Ambulatory Visit (INDEPENDENT_AMBULATORY_CARE_PROVIDER_SITE_OTHER): Payer: Medicare Other | Admitting: Urology

## 2016-09-29 ENCOUNTER — Ambulatory Visit: Payer: Medicare Other | Admitting: Urology

## 2016-09-29 DIAGNOSIS — N3281 Overactive bladder: Secondary | ICD-10-CM

## 2016-09-29 DIAGNOSIS — R351 Nocturia: Secondary | ICD-10-CM

## 2016-09-29 DIAGNOSIS — C61 Malignant neoplasm of prostate: Secondary | ICD-10-CM | POA: Diagnosis not present

## 2016-10-02 ENCOUNTER — Ambulatory Visit: Payer: Medicare Other | Admitting: Family

## 2016-10-10 ENCOUNTER — Other Ambulatory Visit: Payer: Self-pay | Admitting: Nurse Practitioner

## 2016-10-18 ENCOUNTER — Other Ambulatory Visit: Payer: Self-pay | Admitting: Nurse Practitioner

## 2016-10-18 DIAGNOSIS — I10 Essential (primary) hypertension: Secondary | ICD-10-CM

## 2016-11-21 DIAGNOSIS — M545 Low back pain: Secondary | ICD-10-CM | POA: Diagnosis not present

## 2016-12-08 ENCOUNTER — Other Ambulatory Visit: Payer: Self-pay | Admitting: Adult Health

## 2016-12-12 ENCOUNTER — Other Ambulatory Visit: Payer: Self-pay | Admitting: Adult Health

## 2016-12-14 ENCOUNTER — Other Ambulatory Visit: Payer: Self-pay | Admitting: Adult Health

## 2016-12-18 ENCOUNTER — Other Ambulatory Visit: Payer: Self-pay | Admitting: Cardiology

## 2016-12-18 MED ORDER — CARVEDILOL 25 MG PO TABS: 25.0000 mg | ORAL_TABLET | Freq: Two times a day (BID) | ORAL | 3 refills | Status: DC

## 2016-12-18 NOTE — Telephone Encounter (Signed)
90 day supplied Coreg 25 mg BID to [facility]

## 2016-12-18 NOTE — Telephone Encounter (Signed)
Pt made apt to see Dr. [name] on 01/01/17 --- states he may run out of his carvedilol (COREG) 25 MG tablet [[id number]] prior to that apt, he just picked up a 30 day supply yesterday.

## 2016-12-19 ENCOUNTER — Ambulatory Visit: Payer: Medicare Other | Admitting: Family Medicine

## 2016-12-25 DIAGNOSIS — X32XXXD Exposure to sunlight, subsequent encounter: Secondary | ICD-10-CM | POA: Diagnosis not present

## 2016-12-25 DIAGNOSIS — C61 Malignant neoplasm of prostate: Secondary | ICD-10-CM | POA: Diagnosis not present

## 2016-12-25 DIAGNOSIS — D225 Melanocytic nevi of trunk: Secondary | ICD-10-CM | POA: Diagnosis not present

## 2016-12-25 DIAGNOSIS — L821 Other seborrheic keratosis: Secondary | ICD-10-CM | POA: Diagnosis not present

## 2016-12-25 DIAGNOSIS — L570 Actinic keratosis: Secondary | ICD-10-CM | POA: Diagnosis not present

## 2016-12-29 ENCOUNTER — Ambulatory Visit (INDEPENDENT_AMBULATORY_CARE_PROVIDER_SITE_OTHER): Payer: Medicare Other | Admitting: Urology

## 2016-12-29 ENCOUNTER — Encounter: Payer: Self-pay | Admitting: Cardiology

## 2016-12-29 ENCOUNTER — Other Ambulatory Visit: Payer: Self-pay | Admitting: Family Medicine

## 2016-12-29 DIAGNOSIS — R351 Nocturia: Secondary | ICD-10-CM

## 2016-12-29 DIAGNOSIS — C61 Malignant neoplasm of prostate: Secondary | ICD-10-CM | POA: Diagnosis not present

## 2016-12-29 DIAGNOSIS — N3281 Overactive bladder: Secondary | ICD-10-CM | POA: Diagnosis not present

## 2016-12-29 NOTE — Progress Notes (Signed)
Cardiology Office Note  Date: 01/01/2017   ID: KORDAE BUONOCORE, DOB 1946/03/17, MRN [id number]  PCP: [name], [name], MD  Primary Cardiologist: Rozann Lesches, MD   Chief Complaint  Patient presents with  . Cardiac follow-up    History of Present Illness: [name] is a 71 y.o. male last seen in December 2016.He presents overdue for a follow-up visit. Since last encounter does not report any obvious angina symptoms. He has NYHA class II dyspnea. He states that he is limited by arthritic pains, had previous right knee surgery. He struggles to lose any significant amount of weight. We did talk about considering walking laps in a swimming pool or perhaps using a recumbent bicycle.  He is following with [facility] for primary care.  I reviewed his medications. He continues on Norvasc, Coreg, and Cozaar.  I personally reviewed his ECG from today which shows sinus rhythm with prolonged PR interval, left anterior fascicular block, poor R-wave progression.  Last echocardiogram was in 2014 indicating LVEF 60-65% with mild diastolic dysfunction.  Past Medical History:  Diagnosis Date  . Aortic atherosclerosis (HCC)    Penetrating aortic ulcer s/p endograft 2004 - followed by Dr. Trula Slade  . Arthritis   . COPD (chronic obstructive pulmonary disease) ([place])   . Coronary atherosclerosis of native coronary artery    Mild atherosclerosis at catheterization 2011  . Essential hypertension, benign   . GERD (gastroesophageal reflux disease)   . Gout   . History of kidney stones   . Neuropathy   . Prostate cancer ([place])    XRT  . Sleep apnea    Intolerant of CPAP  . Tubular adenoma of colon 08/2008    Past Surgical History:  Procedure Laterality Date  . Aortic endograft repair  2004   Dr. [name]  . CHEST TUBE INSERTION  1989   "collapsed lung"due to injury  . CIRCUMCISION  09/26/2011   Procedure: CIRCUMCISION ADULT;  Surgeon: [name], MD;  [facility];  Service:  Urology;  Laterality: N/A;  . COLONOSCOPY W/ POLYPECTOMY    . COLONOSCOPY WITH PROPOFOL N/A 04/27/2016   Procedure: COLONOSCOPY WITH PROPOFOL;  Surgeon: [name], MD;  [facility];  Service: Endoscopy;  Laterality: N/A;  1115-moved to 845 per Ginger  . CYSTOSCOPY  09/26/2011   Procedure: CYSTOSCOPY;  Surgeon: [name], MD;  [facility];  Service: Urology;  Laterality: N/A;  . ENDOVASCULAR STENT INSERTION  September 13, 2007   EVAR - Aortic stent graft  . ESOPHAGOGASTRODUODENOSCOPY (EGD) WITH PROPOFOL N/A 04/27/2016   Procedure: ESOPHAGOGASTRODUODENOSCOPY (EGD) WITH PROPOFOL;  Surgeon: [name], MD;  [facility];  Service: Endoscopy;  Laterality: N/A;  . EVAR    . FRACTURE SURGERY     Bilateral lower arms as a child  . KNEE ARTHROSCOPY     Left  . KNEE SURGERY  Sept. 26, 2013   Torn minisc.- Right  knee  . MALONEY DILATION N/A 04/27/2016   Procedure: Venia Minks DILATION;  Surgeon: [name], MD;  [facility];  Service: Endoscopy;  Laterality: N/A;  . PARTIAL KNEE ARTHROPLASTY Right 04/26/2015   Procedure: RIGHT KNEE MEDIAL UNICOMPARTMENTAL ARTHROPLASTY;  Surgeon: Gaynelle Arabian, MD;  [facility];  Service: Orthopedics;  Laterality: Right;  . POLYPECTOMY  04/27/2016   Procedure: POLYPECTOMY;  Surgeon: [name], MD;  [facility];  Service: Endoscopy;;  cecal , ascending, descending, and sigmoid polypectomies  .  PROSTATE BIOPSY  09/26/2011   Procedure: BIOPSY TRANSRECTAL ULTRASONIC PROSTATE (TUBP);  Surgeon: [name], MD;  [facility];  Service: Urology;  Laterality: N/A;     . UMBILICAL HERNIA REPAIR      Current Outpatient Prescriptions  Medication Sig Dispense Refill  . allopurinol (ZYLOPRIM) 300 MG tablet TAKE 1 TABLET BY MOUTH ONCE DAILY 30 tablet 4  . amLODipine (NORVASC) 10 MG tablet TAKE ONE TABLET BY MOUTH ONCE DAILY 90 tablet 1  . carvedilol (COREG) 25 MG tablet Take 1 tablet (25 mg total) by mouth 2  (two) times daily. 180 tablet 3  . diclofenac sodium (VOLTAREN) 1 % GEL Apply 2 g topically daily as needed (knee pain).    . DULoxetine (CYMBALTA) 30 MG capsule Take 1 capsule (30 mg total) by mouth daily. 90 capsule 1  . losartan (COZAAR) 50 MG tablet TAKE ONE TABLET BY MOUTH ONCE DAILY 90 tablet 0  . pantoprazole (PROTONIX) 40 MG tablet TAKE ONE TABLET BY MOUTH TWICE DAILY 60 tablet 5   No current facility-administered medications for this visit.    Allergies:  Carbidopa-levodopa; Morphine sulfate; and Sulfacetamide sodium   Social History: The patient  reports that he has been smoking Cigarettes.  He has a 21.00 pack-year smoking history. He has never used smokeless tobacco. He reports that he does not drink alcohol or use drugs.   ROS:  Please see the history of present illness. Otherwise, complete review of systems is positive for trouble with balance and falls.  All other systems are reviewed and negative.   Physical Exam: VS:  BP (!) 142/92 (BP Location: Right Arm)   Pulse 74   Ht 5\' 8"  (1.727 m)   Wt 281 lb (127.5 kg)   SpO2 95%   BMI 42.73 kg/m , BMI Body mass index is 42.73 kg/m.  Wt Readings from Last 3 Encounters:  01/01/17 281 lb (127.5 kg)  09/11/16 284 lb (128.8 kg)  07/10/16 289 lb 3.2 oz (131.2 kg)    General: Morbidly obese male, appears comfortable at rest. HEENT: Conjunctiva and lids normal, oropharynx clear. Neck: Supple, no elevated JVP or carotid bruits, no thyromegaly. Lungs: Clear to auscultation, nonlabored breathing at rest. Cardiac: Regular rate and rhythm, no S3, soft systolic murmur, no pericardial rub. Abdomen: Soft, nontender, bowel sounds present, no guarding or rebound. Extremities: No pitting edema, distal pulses 2+. Skin: Warm and dry. Musculoskeletal: No kyphosis. Neuropsychiatric: Alert and oriented x3, affect grossly appropriate.  ECG: I personally reviewed the tracing from 07/22/2015 which showed sinus rhythm with prolonged PR interval  and left anterior fascicular block.  Recent Labwork: 04/25/2016: BUN 21; Creatinine, Ser 1.56; Hemoglobin 12.9; Platelets 215; Potassium 3.5; Sodium 138     Component Value Date/Time   CHOL 225 (H) 12/30/2014 1254   CHOL 188 10/15/2012 1320   TRIG 165 (H) 12/30/2014 1254   TRIG 201 (H) 10/15/2012 1320   HDL 38 (L) 12/30/2014 1254   HDL 36 (L) 10/15/2012 1320   CHOLHDL 5.9 (H) 12/30/2014 1254   CHOLHDL 5 11/25/2009 0837   VLDL 40.4 (H) 11/25/2009 0837   LDLCALC 154 (H) 12/30/2014 1254   LDLCALC 112 (H) 10/15/2012 1320   LDLDIRECT 132.2 11/25/2009 0837    Other Studies Reviewed Today:  Echocardiogram 01/07/2013: Study Conclusions  - Study data: Technically difficult study - Left ventricle: The cavity size was normal. Wall thickness was normal. Systolic function was normal. The estimated ejection fraction was in the range of 60% to 65%.  Doppler parameters are consistent with abnormal left ventricular relaxation (grade 1 diastolic dysfunction). - Aortic valve: Mild regurgitation. Valve area: 1.88cm^2(VTI). Valve area: 1.89cm^2 (Vmax). - Mitral valve: Mildly to moderately calcified annulus. Mildly thickened leaflets . - Left atrium: The atrium was mildly dilated.  Assessment and Plan:  1. CAD, mild by cardiac catheterization in 2011. He reports no active angina symptoms and continues on medical therapy. ECG reviewed and stable.  2. Essential hypertension, blood pressure is mildly elevated today. He continues on Coreg, Cozaar, and Norvasc. We did discuss weight loss and possible exercise plans.  3. History of penetrating aortic ulcer status post endograft in 2004. He follows with Dr. Trula Slade, had a follow-up chest CTA last year.  4. OSA, intolerant of CPAP. Weight loss discussed.  Current medicines were reviewed with the patient today.   Orders Placed This Encounter  Procedures  . EKG 12-Lead  . ECHOCARDIOGRAM COMPLETE    Disposition: Follow-up in one  year.  Signed, Satira Sark, MD, [facility] 01/01/2017 11:54 AM    [facility] [place]. [address] [place], [place] [address] Phone: [phone]; Fax: [phone]

## 2017-01-01 ENCOUNTER — Ambulatory Visit (INDEPENDENT_AMBULATORY_CARE_PROVIDER_SITE_OTHER): Payer: Medicare Other | Admitting: Cardiology

## 2017-01-01 ENCOUNTER — Encounter: Payer: Self-pay | Admitting: Cardiology

## 2017-01-01 VITALS — BP 142/92 | HR 74 | Ht 68.0 in | Wt 281.0 lb

## 2017-01-01 DIAGNOSIS — G4733 Obstructive sleep apnea (adult) (pediatric): Secondary | ICD-10-CM

## 2017-01-01 DIAGNOSIS — I10 Essential (primary) hypertension: Secondary | ICD-10-CM | POA: Diagnosis not present

## 2017-01-01 DIAGNOSIS — I2510 Atherosclerotic heart disease of native coronary artery without angina pectoris: Secondary | ICD-10-CM | POA: Diagnosis not present

## 2017-01-01 DIAGNOSIS — I700 Atherosclerosis of aorta: Secondary | ICD-10-CM

## 2017-01-01 DIAGNOSIS — I719 Aortic aneurysm of unspecified site, without rupture: Secondary | ICD-10-CM

## 2017-01-01 MED ORDER — CARVEDILOL 25 MG PO TABS: 25.0000 mg | ORAL_TABLET | Freq: Two times a day (BID) | ORAL | 3 refills | Status: DC

## 2017-01-01 NOTE — Patient Instructions (Signed)
Medication Instructions: No change  Labwork: None ordered  Procedures/Testing: Your physician has requested that you have an echocardiogram ( JUST BEFORE NEXT VISIT ). Echocardiography is a painless test that uses sound waves to create images of your heart. It provides your doctor with information about the size and shape of your heart and how well your heart's chambers and valves are working. This procedure takes approximately one hour. There are no restrictions for this procedure.    Follow-Up: 1 year Dr.McDowell  Any Additional Special Instructions Will Be Listed Below (If Applicable).     If you need a refill on your cardiac medications before your next appointment, please call your pharmacy.       Thank you for choosing [facility] !

## 2017-01-02 ENCOUNTER — Ambulatory Visit: Payer: Medicare Other | Admitting: *Deleted

## 2017-01-05 ENCOUNTER — Other Ambulatory Visit: Payer: Self-pay | Admitting: Family Medicine

## 2017-01-05 DIAGNOSIS — G6289 Other specified polyneuropathies: Secondary | ICD-10-CM

## 2017-01-13 ENCOUNTER — Other Ambulatory Visit: Payer: Self-pay | Admitting: Nurse Practitioner

## 2017-02-18 ENCOUNTER — Encounter (HOSPITAL_COMMUNITY): Payer: Self-pay | Admitting: *Deleted

## 2017-02-18 ENCOUNTER — Other Ambulatory Visit: Payer: Self-pay

## 2017-02-18 ENCOUNTER — Emergency Department (HOSPITAL_COMMUNITY): Payer: Medicare Other

## 2017-02-18 ENCOUNTER — Emergency Department (HOSPITAL_COMMUNITY)
Admission: EM | Admit: 2017-02-18 | Discharge: 2017-02-18 | Disposition: A | Discharge status: Home / Self Care | Payer: Medicare Other | Attending: Emergency Medicine | Admitting: Emergency Medicine

## 2017-02-18 DIAGNOSIS — I129 Hypertensive chronic kidney disease with stage 1 through stage 4 chronic kidney disease, or unspecified chronic kidney disease: Secondary | ICD-10-CM | POA: Diagnosis not present

## 2017-02-18 DIAGNOSIS — Y929 Unspecified place or not applicable: Secondary | ICD-10-CM | POA: Diagnosis not present

## 2017-02-18 DIAGNOSIS — N182 Chronic kidney disease, stage 2 (mild): Secondary | ICD-10-CM | POA: Insufficient documentation

## 2017-02-18 DIAGNOSIS — Y9301 Activity, walking, marching and hiking: Secondary | ICD-10-CM | POA: Insufficient documentation

## 2017-02-18 DIAGNOSIS — S6992XA Unspecified injury of left wrist, hand and finger(s), initial encounter: Secondary | ICD-10-CM | POA: Diagnosis not present

## 2017-02-18 DIAGNOSIS — M25532 Pain in left wrist: Secondary | ICD-10-CM | POA: Diagnosis not present

## 2017-02-18 DIAGNOSIS — Z79899 Other long term (current) drug therapy: Secondary | ICD-10-CM | POA: Diagnosis not present

## 2017-02-18 DIAGNOSIS — J449 Chronic obstructive pulmonary disease, unspecified: Secondary | ICD-10-CM | POA: Diagnosis not present

## 2017-02-18 DIAGNOSIS — S6991XA Unspecified injury of right wrist, hand and finger(s), initial encounter: Secondary | ICD-10-CM | POA: Diagnosis present

## 2017-02-18 DIAGNOSIS — F17210 Nicotine dependence, cigarettes, uncomplicated: Secondary | ICD-10-CM | POA: Insufficient documentation

## 2017-02-18 DIAGNOSIS — Y999 Unspecified external cause status: Secondary | ICD-10-CM | POA: Insufficient documentation

## 2017-02-18 DIAGNOSIS — S63501A Unspecified sprain of right wrist, initial encounter: Secondary | ICD-10-CM | POA: Diagnosis not present

## 2017-02-18 DIAGNOSIS — W101XXA Fall (on)(from) sidewalk curb, initial encounter: Secondary | ICD-10-CM | POA: Insufficient documentation

## 2017-02-18 DIAGNOSIS — Z96651 Presence of right artificial knee joint: Secondary | ICD-10-CM | POA: Diagnosis not present

## 2017-02-18 DIAGNOSIS — S60911A Unspecified superficial injury of right wrist, initial encounter: Secondary | ICD-10-CM | POA: Diagnosis not present

## 2017-02-18 MED ORDER — HYDROCODONE-ACETAMINOPHEN 5-325 MG PO TABS: ORAL_TABLET | ORAL | 0 refills | Status: DC

## 2017-02-18 NOTE — ED Triage Notes (Signed)
Pt states he fell off a curb just pta. Pt states he caught his himself with his right wrist. Pt reports right wrist and forearm pain.

## 2017-02-18 NOTE — ED Notes (Signed)
Ice pack placed on right arm

## 2017-02-18 NOTE — ED Provider Notes (Signed)
[facility] EMERGENCY DEPARTMENT Provider Note   CSN: [id number] Arrival date & time: 02/18/17  1853     History   Chief Complaint Chief Complaint  Patient presents with  . Wrist Injury    HPI [name] is a 71 y.o. male.  Patient complains of falling and hurting his right wrist.   The history is provided by the patient. No language interpreter was used.  Wrist Injury   The incident occurred 3 to 5 hours ago. The incident occurred at home. The injury mechanism was a fall. The pain is present in the right wrist. The quality of the pain is described as aching. The pain is at a severity of 3/10. The pain has been constant since the incident. Associated symptoms include a fever.    Past Medical History:  Diagnosis Date  . Aortic atherosclerosis (HCC)    Penetrating aortic ulcer s/p endograft 2004 - followed by Dr. Trula Slade  . Arthritis   . COPD (chronic obstructive pulmonary disease) ([place])   . Coronary atherosclerosis of native coronary artery    Mild atherosclerosis at catheterization 2011  . Essential hypertension, benign   . GERD (gastroesophageal reflux disease)   . Gout   . History of kidney stones   . Neuropathy   . Prostate cancer ([place])    XRT  . Sleep apnea    Intolerant of CPAP  . Tubular adenoma of colon 08/2008    Patient Active Problem List   Diagnosis Date Noted  . Dysphagia 07/10/2016  . Diarrhea 07/10/2016  . BMI 40.0-44.9, adult ([place]) 05/31/2015  . Neuropathy 03/17/2015  . Prostate cancer ([place]) 06/30/2013  . Abdominal aneurysm without mention of rupture 02/19/2013  . Need for prophylactic vaccination and inoculation against influenza 02/03/2013  . ANXIETY 01/14/2013  . OBESITY NOS 01/14/2013  . VITAMIN B12 DEFICIENCY 01/14/2013  . TOBACCO ABUSE 01/14/2013  . PTSD 01/14/2013  . COPD (chronic obstructive pulmonary disease) ([place])   . OSA (obstructive sleep apnea) 08/14/2012  . RLS (restless legs syndrome) 08/14/2012  . Arthritis of both  knees 08/14/2012  . Prediabetes 08/14/2012  . Nephrolithiasis 08/14/2012  . Aortic atherosclerosis ([place]) 02/19/2012  . Coronary atherosclerosis of native coronary artery 03/23/2010  . CHRONIC KIDNEY DISEASE STAGE II (MILD) 10/13/2009  . GERD 07/30/2008  . BENIGN PROSTATIC HYPERTROPHY, WITH OBSTRUCTION 02/20/2008  . Hyperlipidemia 01/31/2007  . GOUT 01/31/2007  . Essential hypertension, benign 01/31/2007    Past Surgical History:  Procedure Laterality Date  . Aortic endograft repair  2004   Dr. [name]  . BIOPSY TRANSRECTAL ULTRASONIC PROSTATE (TUBP) N/A 09/26/2011   Performed by [name], MD at [facility] ORS  . CHEST TUBE INSERTION  1989   "collapsed lung"due to injury  . CIRCUMCISION ADULT N/A 09/26/2011   Performed by [name], MD at [facility] ORS  . COLONOSCOPY W/ POLYPECTOMY    . COLONOSCOPY WITH PROPOFOL N/A 04/27/2016   Performed by [name], MD at [place]  . CYSTOSCOPY N/A 09/26/2011   Performed by [name], MD at [facility] ORS  . ENDOVASCULAR STENT INSERTION  September 13, 2007   EVAR - Aortic stent graft  . ESOPHAGOGASTRODUODENOSCOPY (EGD) WITH PROPOFOL N/A 04/27/2016   Performed by [name], MD at [place]  . EVAR    . FRACTURE SURGERY     Bilateral lower arms as a child  . KNEE ARTHROSCOPY     Left  . KNEE SURGERY  Sept. 26, 2013   Torn  minisc.- Right  knee  . MALONEY DILATION N/A 04/27/2016   Performed by [name], MD at [place]  . POLYPECTOMY  04/27/2016   Performed by [name], MD at [place]  . RIGHT KNEE MEDIAL UNICOMPARTMENTAL ARTHROPLASTY Right 04/26/2015   Performed by Gaynelle Arabian, MD at [facility] ORS  . UMBILICAL HERNIA REPAIR         Home Medications    Prior to Admission medications   Medication Sig Start Date End Date Taking? Authorizing Provider  allopurinol (ZYLOPRIM) 300 MG tablet TAKE 1 TABLET BY MOUTH ONCE DAILY 01/01/17  Yes [name], [name], MD  amLODipine (NORVASC) 10 MG tablet TAKE ONE TABLET BY MOUTH ONCE  DAILY 10/19/16  Yes [name], [name], MD  carvedilol (COREG) 25 MG tablet Take 1 tablet (25 mg total) by mouth 2 (two) times daily. 01/01/17 04/01/17 Yes Satira Sark, MD  DULoxetine (CYMBALTA) 30 MG capsule TAKE 1 CAPSULE BY MOUTH ONCE DAILY 01/08/17  Yes [name], [name], MD  losartan (COZAAR) 50 MG tablet TAKE 1 TABLET BY MOUTH ONCE DAILY 01/15/17  Yes [name], [name], MD  pantoprazole (PROTONIX) 40 MG tablet TAKE ONE TABLET BY MOUTH TWICE DAILY 07/03/16  Yes [name], [name], MD  HYDROcodone-acetaminophen (NORCO/VICODIN) 5-325 MG tablet Take 1 every 6 hours for pain not helped with Tylenol 02/18/17   Milton Ferguson, MD    Family History Family History  Problem Relation Age of Onset  . Stroke Mother   . Mesothelioma Father   . Cancer Father   . Hyperlipidemia Sister   . Heart attack Sister   . Cancer Brother 84  . Heart disease Brother   . Hyperlipidemia Brother   . Colon cancer Neg Hx     Social History Social History   Tobacco Use  . Smoking status: Current Every Day Smoker    Packs/day: 0.50    Years: 42.00    Pack years: 21.00    Types: Cigarettes  . Smokeless tobacco: Never Used  . Tobacco comment: 12 ciggs per day  Substance Use Topics  . Alcohol use: No    Alcohol/week: 0.0 oz  . Drug use: No     Allergies   Carbidopa-levodopa; Morphine sulfate; and Sulfacetamide sodium   Review of Systems Review of Systems  Constitutional: Positive for fever. Negative for appetite change and fatigue.  HENT: Negative for congestion, ear discharge and sinus pressure.   Eyes: Negative for discharge.  Respiratory: Negative for cough.   Cardiovascular: Negative for chest pain.  Gastrointestinal: Negative for abdominal pain and diarrhea.  Genitourinary: Negative for frequency and hematuria.  Musculoskeletal: Negative for back pain.       Moderate wrist pain on the right  Skin: Negative for rash.  Neurological: Negative for seizures and headaches.    Psychiatric/Behavioral: Negative for hallucinations.     Physical Exam Updated Vital Signs BP (!) 174/100 (BP Location: Left Arm)   Pulse 73   Temp 97.8 F (36.6 C) (Oral)   Resp 16   Ht 5\' 8"  (1.727 m)   Wt 126.6 kg (279 lb)   SpO2 96%   BMI 42.42 kg/m   Physical Exam  Constitutional: He is oriented to person, place, and time. He appears well-developed.  HENT:  Head: Normocephalic.  Eyes: Conjunctivae and EOM are normal. No scleral icterus.  Neck: Neck supple. No thyromegaly present.  Cardiovascular: Normal rate and regular rhythm. Exam reveals no gallop and no friction rub.  No murmur  heard. Pulmonary/Chest: No stridor. He has no wheezes. He has no rales. He exhibits no tenderness.  Abdominal: He exhibits no distension. There is no tenderness. There is no rebound.  Musculoskeletal: Normal range of motion. He exhibits no edema.  Swollen tender right wrist  Lymphadenopathy:    He has no cervical adenopathy.  Neurological: He is oriented to person, place, and time. He exhibits normal muscle tone. Coordination normal.  Skin: No rash noted. No erythema.  Psychiatric: He has a normal mood and affect. His behavior is normal.  Nursing note and vitals reviewed.    ED Treatments / Results  Labs (all labs ordered are listed, but only abnormal results are displayed) Labs Reviewed - No data to display  EKG  EKG Interpretation None       Radiology Dg Wrist Complete Left  Result Date: 02/18/2017 CLINICAL DATA:  Recent fall with wrist pain, initial encounter EXAM: LEFT WRIST - COMPLETE 3+ VIEW COMPARISON:  None. FINDINGS: There is no evidence of fracture or dislocation. There is no evidence of arthropathy or other focal bone abnormality. Soft tissues are unremarkable. IMPRESSION: No acute abnormality noted. Electronically Signed   By: Inez Catalina M.D.   On: 02/18/2017 20:33    Procedures Procedures (including critical care time)  Medications Ordered in ED Medications  - No data to display   Initial Impression / Assessment and Plan / ED Course  I have reviewed the triage vital signs and the nursing notes.  Pertinent labs & imaging results that were available during my care of the patient were reviewed by me and considered in my medical decision making (see chart for details).     X-ray for wrist was negative.  Diagnosis sprained right wrist.  Patient will follow-up with his doctor as needed and will wear a wrist splint for comfort  Final Clinical Impressions(s) / ED Diagnoses   Final diagnoses:  Wrist injury, right, initial encounter    ED Discharge Orders        Ordered    HYDROcodone-acetaminophen (NORCO/VICODIN) 5-325 MG tablet     02/18/17 2051       Milton Ferguson, MD 02/18/17 2055

## 2017-02-18 NOTE — Discharge Instructions (Signed)
Follow-up with your family doctor if not improving 

## 2017-02-26 ENCOUNTER — Ambulatory Visit: Payer: Medicare Other | Admitting: Surgery

## 2017-02-26 ENCOUNTER — Ambulatory Visit (INDEPENDENT_AMBULATORY_CARE_PROVIDER_SITE_OTHER): Payer: Medicare Other | Admitting: Family Medicine

## 2017-02-26 ENCOUNTER — Encounter: Payer: Self-pay | Admitting: Family Medicine

## 2017-02-26 VITALS — BP 153/101 | HR 67 | Temp 98.2°F | Ht 68.0 in | Wt 273.0 lb

## 2017-02-26 DIAGNOSIS — R030 Elevated blood-pressure reading, without diagnosis of hypertension: Secondary | ICD-10-CM | POA: Diagnosis not present

## 2017-02-26 DIAGNOSIS — W19XXXA Unspecified fall, initial encounter: Secondary | ICD-10-CM

## 2017-02-26 DIAGNOSIS — R2689 Other abnormalities of gait and mobility: Secondary | ICD-10-CM | POA: Diagnosis not present

## 2017-02-26 DIAGNOSIS — Z9181 History of falling: Secondary | ICD-10-CM | POA: Diagnosis not present

## 2017-02-26 DIAGNOSIS — S6991XA Unspecified injury of right wrist, hand and finger(s), initial encounter: Secondary | ICD-10-CM | POA: Diagnosis not present

## 2017-02-26 NOTE — Progress Notes (Signed)
Subjective: CC: ED follow up PCP: [name], [name], MD NAT:[id number] E Geller is a 71 y.o. male presenting to clinic today for:  1. ED follow up/ fall/ neuropathy Patient notes that he was seen in the [facility] on February 18, 2017 after he sustained a fall.  He describes the fall as him bending over, losing balance and then subsequently falling on his right side.  He did injure his right wrist at that time.  He had imaging studies done in the ER that did not reveal acute fracture.  He is currently in a wrist splint for sprain.  He notes that he took naproxen which helped his pain substantially and he has not taken any Norco which was prescribed to him.  Leading up to the fall he denies dizziness, shortness of breath, visual disturbance, weakness.  He did not lose consciousness.  He did not hit his head.  He notes that he has chronic neuropathy of the lower extremities.  He has had several falls over the last year.  Denies history of stroke.  He notes that neuropathy started after he had radiation to his prostate a couple of years ago.  No history of diabetes or vitamin deficiency that he knows of.  Never an alcoholic. He has never been seen by physical therapy for help with balance.  He does have a rolling walker and a cane at home but he does not use these per his wife's report.  He denies dizziness or falls associated with position changes.  He notes that the falls occur at random.  2. HTN Patient is on 3 blood pressure medications.  He reports compliance with these meds.  Last blood pressure medication was taken last evening.  He denies chest pain, shortness of breath, visual disturbance.  He does have balance problems as above.  No other neurologic abnormalities that patient can identify.  He notes that he is struggled with elevated blood pressures for some time.  Allergies  Allergen Reactions  . Carbidopa-Levodopa Other (See Comments)    hallunications  . Morphine Sulfate Nausea And  Vomiting  . Sulfacetamide Sodium Nausea And Vomiting   Past Medical History:  Diagnosis Date  . Aortic atherosclerosis (HCC)    Penetrating aortic ulcer s/p endograft 2004 - followed by Dr. Trula Slade  . Arthritis   . COPD (chronic obstructive pulmonary disease) ([place])   . Coronary atherosclerosis of native coronary artery    Mild atherosclerosis at catheterization 2011  . Essential hypertension, benign   . GERD (gastroesophageal reflux disease)   . Gout   . History of kidney stones   . Neuropathy   . Prostate cancer ([place])    XRT  . Sleep apnea    Intolerant of CPAP  . Tubular adenoma of colon 08/2008   Family History  Problem Relation Age of Onset  . Stroke Mother   . Mesothelioma Father   . Cancer Father   . Hyperlipidemia Sister   . Heart attack Sister   . Cancer Brother 32  . Heart disease Brother   . Hyperlipidemia Brother   . Colon cancer Neg Hx     Current Outpatient Medications:  .  allopurinol (ZYLOPRIM) 300 MG tablet, TAKE 1 TABLET BY MOUTH ONCE DAILY, Disp: 30 tablet, Rfl: 4 .  amLODipine (NORVASC) 10 MG tablet, TAKE ONE TABLET BY MOUTH ONCE DAILY, Disp: 90 tablet, Rfl: 1 .  carvedilol (COREG) 25 MG tablet, Take 1 tablet (25 mg total) by mouth 2 (two) times  daily., Disp: 180 tablet, Rfl: 3 .  DULoxetine (CYMBALTA) 30 MG capsule, TAKE 1 CAPSULE BY MOUTH ONCE DAILY, Disp: 90 capsule, Rfl: 1 .  HYDROcodone-acetaminophen (NORCO/VICODIN) 5-325 MG tablet, Take 1 every 6 hours for pain not helped with Tylenol, Disp: 20 tablet, Rfl: 0 .  losartan (COZAAR) 50 MG tablet, TAKE 1 TABLET BY MOUTH ONCE DAILY, Disp: 90 tablet, Rfl: 0 .  pantoprazole (PROTONIX) 40 MG tablet, TAKE ONE TABLET BY MOUTH TWICE DAILY, Disp: 60 tablet, Rfl: 5  Social Hx: non smoker.  ROS: Per HPI  Objective: Office vital signs reviewed. BP (!) 153/101   Pulse 67   Temp 98.2 F (36.8 C) (Oral)   Ht 5\' 8"  (1.727 m)   Wt 273 lb (123.8 kg)   BMI 41.51 kg/m   Physical Examination:  General:  Awake, alert, obese male, No acute distress HEENT: Normal, EOMI, sclera white Cardio: regular rate; +2 radial pulses Pulm: no wheeze, normal work of breathing on room air MSK: Ambulates independently.  Right wrist in a splint.  He has got good capillary refill of the right hand.  Light touch sensation intact. Neuro: Follows all commands.  Upper extremity and lower extremity cerebellar testing within normal limits.  No focal neurologic deficits.  Orthostatic VS for the past 24 hrs:  BP- Lying Pulse- Lying BP- Sitting Pulse- Sitting BP- Standing at 0 minutes Pulse- Standing at 0 minutes  02/26/17 1034 (!) 150/98 68 (!) 153/101 67 137/89 70    Assessment/ Plan: 71 y.o. male   1. Fall, initial encounter Wrist sprain doing well with naproxen.  I did advise that if he could avoid use of Norco that this would be better especially given the history of recurrent falls.  His neurologic exam was fairly unremarkable with regards to cranial nerves and cerebellum.  I do not think that this is a central issue.  He had negative orthostatic vital signs today.  Additionally, he is not endorsing any syncopal or presyncopal events.  I do wonder if his neuropathy in the feet contribute to frequent falls.  He had vitamin B12 testing done in 2014 and this was normal.  Would consider repeating this along with other labs pertaining to neuropathy if they have not already been done.  Could consider initiation of gabapentin if needed for neuropathic pain.  I do not see where he is ever been seen by a neurologist but this might be another option in the future should his PCP decide this is clinically indicated.  In the interim, I have referred him to physical therapy to see if we can improve balance and reduce falls at home.  Handout was also provided outlining things that they can do to reduce his risk of falling.  I did encourage him to start using his walker again to increase stability.  He voiced good understanding.  He will  follow-up with his PCP in about 4 weeks. - Ambulatory referral to Physical Therapy  2. Injury of right wrist, initial encounter Stable.  No evidence of vascular or neurologic compromise.  3. History of falling See above - Ambulatory referral to Physical Therapy  4. Balance problem See above - Ambulatory referral to Physical Therapy  5. Elevated blood pressure reading Has not taken today's blood pressure medication.  Per his report he is running usually 384Y systolic over 65L.  I did not adjust any of his blood pressure medications today given that he has not taken any blood pressure medication today.  However, I did  recommend that he follow-up in the next 4 weeks with his PCP for possible adjustments of meds.  He is maxed out on Coreg and Norvasc but Cozaar can be increased to 100 mg daily.  No red flag symptoms on exam today.   Orders Placed This Encounter  Procedures  . Ambulatory referral to Physical Therapy    Referral Priority:   Routine    Referral Type:   Physical Medicine    Referral Reason:   Specialty Services Required    Requested Specialty:   Physical Therapy    Number of Visits Requested:   1   No orders of the defined types were placed in this encounter.    Janora Norlander, DO [place] [phone]

## 2017-02-26 NOTE — Patient Instructions (Signed)
I have placed a referral to physical therapy to help with your balance and reduce your risk of falls.  I have also given you a list of things you can do at home to reduce her risk of falling at home.  I suspect that your recurrent falling is probably from the neuropathy in your feet.  The portion that controls balance in your brain seems to be intact.  Use your walker to reduce risk of falling.  It is important to avoid accidents which may result in broken bones.  Here are a few ideas on how to make your home safer so you will be less likely to trip or fall.  1. Use nonskid mats or non slip strips in your shower or tub, on your bathroom floor and around sinks.  If you know that you have spilled water, wipe it up! 2. In the bathroom, it is important to have properly installed grab bars on the walls or on the edge of the tub.  Towel racks are NOT strong enough for you to hold onto or to pull on for support. 3. Stairs and hallways should have enough light.  Add lamps or night lights if you need ore light. 4. It is good to have handrails on both sides of the stairs if possible.  Always fix broken handrails right away. 5. It is important to see the edges of steps.  Paint the edges of outdoor steps white so you can see them better.  Put colored tape on the edge of inside steps. 6. Throw-rugs are dangerous because they can slide.  Removing the rugs is the best idea, but if they must stay, add adhesive carpet tape to prevent slipping. 7. Do not keep things on stairs or in the halls.  Remove small furniture that blocks the halls as it may cause you to trip.  Keep telephone and electrical cords out of the way where you walk. 8. Always were sturdy, rubber-soled shoes for good support.  Never wear just socks, especially on the stairs.  Socks may cause you to slip or fall.  Do not wear full-length housecoats as you can easily trip on the bottom.  9. Place the things you use the most on the shelves that are the  easiest to reach.  If you use a stepstool, make sure it is in good condition.  If you feel unsteady, DO NOT climb, ask for help. 10. If a health professional advises you to use a cane or walker, do not be ashamed.  These items can keep you from falling and breaking your bones.

## 2017-03-06 ENCOUNTER — Encounter: Payer: Self-pay | Admitting: Family Medicine

## 2017-03-06 ENCOUNTER — Ambulatory Visit (INDEPENDENT_AMBULATORY_CARE_PROVIDER_SITE_OTHER): Payer: Medicare Other

## 2017-03-06 ENCOUNTER — Ambulatory Visit (INDEPENDENT_AMBULATORY_CARE_PROVIDER_SITE_OTHER): Payer: Medicare Other | Admitting: Family Medicine

## 2017-03-06 VITALS — BP 163/97 | HR 69 | Temp 96.8°F | Wt 274.4 lb

## 2017-03-06 DIAGNOSIS — W19XXXA Unspecified fall, initial encounter: Secondary | ICD-10-CM

## 2017-03-06 DIAGNOSIS — M25521 Pain in right elbow: Secondary | ICD-10-CM | POA: Diagnosis not present

## 2017-03-06 DIAGNOSIS — S52101A Unspecified fracture of upper end of right radius, initial encounter for closed fracture: Secondary | ICD-10-CM

## 2017-03-06 DIAGNOSIS — S52134A Nondisplaced fracture of neck of right radius, initial encounter for closed fracture: Secondary | ICD-10-CM | POA: Diagnosis not present

## 2017-03-06 DIAGNOSIS — I2510 Atherosclerotic heart disease of native coronary artery without angina pectoris: Secondary | ICD-10-CM | POA: Diagnosis not present

## 2017-03-06 DIAGNOSIS — S59901A Unspecified injury of right elbow, initial encounter: Secondary | ICD-10-CM | POA: Diagnosis not present

## 2017-03-06 NOTE — Progress Notes (Signed)
   HPI  Patient presents today here with right elbow pain.  Patient states that he had a fall on 02/18/2017 where he fell forward onto the sidewalk with outstretched hands.  He was seen in the emergency room and reported right wrist pain, wrist x-ray was negative.  Patient states that he is been wearing a wrist brace, however he has been having right elbow pain in the right antecubital fossa.  He has no limited mobility.  He has been able to drive and do activities as needed.  PMH: Smoking status noted ROS: Per HPI  Objective: BP (!) 163/97   Pulse 69   Temp (!) 96.8 F (36 C) (Oral)   Wt 274 lb 6.4 oz (124.5 kg)   BMI 41.72 kg/m  Gen: NAD, alert, cooperative with exam HEENT: NCAT CV: RRR, good S1/S2, no murmur Resp: CTABL, no wheezes, non-labored Ext: No edema, warm Neuro: Alert and oriented, No gross deficits  Assessment and plan:  #Radius fracture X-ray consistent with radius fracture Orthopedics appt arranged.    Orders Placed This Encounter  Procedures  . DG Elbow 2 Views Right    Standing Status:   Future    Number of Occurrences:   1    Standing Expiration Date:   05/06/2018    Order Specific Question:   Reason for Exam (SYMPTOM  OR DIAGNOSIS REQUIRED)    Answer:   fall    Order Specific Question:   Preferred imaging location?    Answer:   Internal     Laroy Apple, MD [facility] 03/06/2017, 9:55 AM

## 2017-03-20 DIAGNOSIS — S52134D Nondisplaced fracture of neck of right radius, subsequent encounter for closed fracture with routine healing: Secondary | ICD-10-CM | POA: Diagnosis not present

## 2017-03-21 ENCOUNTER — Other Ambulatory Visit: Payer: Self-pay

## 2017-03-21 ENCOUNTER — Ambulatory Visit (INDEPENDENT_AMBULATORY_CARE_PROVIDER_SITE_OTHER): Payer: Medicare Other | Admitting: Family Medicine

## 2017-03-21 ENCOUNTER — Encounter: Payer: Self-pay | Admitting: Family Medicine

## 2017-03-21 VITALS — BP 145/95 | HR 64 | Temp 97.0°F | Ht 68.0 in | Wt 274.0 lb

## 2017-03-21 DIAGNOSIS — I2510 Atherosclerotic heart disease of native coronary artery without angina pectoris: Secondary | ICD-10-CM | POA: Diagnosis not present

## 2017-03-21 DIAGNOSIS — R2689 Other abnormalities of gait and mobility: Secondary | ICD-10-CM | POA: Diagnosis not present

## 2017-03-21 DIAGNOSIS — R296 Repeated falls: Secondary | ICD-10-CM

## 2017-03-21 DIAGNOSIS — I10 Essential (primary) hypertension: Secondary | ICD-10-CM

## 2017-03-21 NOTE — Progress Notes (Signed)
BP (!) 167/93   Pulse 64   Temp (!) 97 F (36.1 C) (Oral)   Ht 5\' 8"  (1.727 m)   Wt 274 lb (124.3 kg)   BMI 41.66 kg/m    Subjective:    Patient ID: [name], male    DOB: 12-Mar-1946, 71 y.o.   MRN: [id number]  HPI: [name] is a 71 y.o. male presenting on 03/21/2017 for Followup elevated hypertension   HPI Recurrent falls and balance issues Patient has been having balance issues over the months and has had a couple of different falls and most recently had a fall that resulted in a fracture of his forearm.  Patient has still been feeling like he is off balance and like he could fall over at any point.  He does not feel like the room is spinning.  He does not feel lightheaded or dizzy like he is going to pass out.  He denies any chest pain or palpitations.  He denies any weakness in his legs but just feels weak when it comes to balance.  Hypertension Patient is currently on amlodipine and carvedilol and losartan, and their blood pressure today is 167/93. Patient denies any lightheadedness or dizziness. Patient denies headaches, blurred vision, chest pains, shortness of breath, or weakness. Denies any side effects from medication and is content with current medication.   Relevant past medical, surgical, family and social history reviewed and updated as indicated. Interim medical history since our last visit reviewed. Allergies and medications reviewed and updated.  Review of Systems  Constitutional: Negative for chills and fever.  Respiratory: Negative for shortness of breath and wheezing.   Cardiovascular: Negative for chest pain and leg swelling.  Musculoskeletal: Negative for back pain and gait problem.  Skin: Negative for rash.  Neurological: Negative for dizziness, speech difficulty, weakness, light-headedness, numbness and headaches.  All other systems reviewed and are negative.   Per HPI unless specifically indicated above     Objective:    BP (!)  167/93   Pulse 64   Temp (!) 97 F (36.1 C) (Oral)   Ht 5\' 8"  (1.727 m)   Wt 274 lb (124.3 kg)   BMI 41.66 kg/m   Wt Readings from Last 3 Encounters:  03/21/17 274 lb (124.3 kg)  03/06/17 274 lb 6.4 oz (124.5 kg)  02/26/17 273 lb (123.8 kg)    Physical Exam  Constitutional: He is oriented to person, place, and time. He appears well-developed and well-nourished. No distress.  Eyes: Conjunctivae are normal. No scleral icterus.  Cardiovascular: Normal rate, regular rhythm, normal heart sounds and intact distal pulses.  No murmur heard. Pulmonary/Chest: Effort normal and breath sounds normal. No respiratory distress. He has no wheezes. He has no rales.  Musculoskeletal: Normal range of motion. He exhibits no edema.  Strength 5/5 in lower extremities  Neurological: He is alert and oriented to person, place, and time. He displays normal reflexes. No cranial nerve deficit. He exhibits normal muscle tone. Coordination normal.  Skin: Skin is warm and dry. No rash noted. He is not diaphoretic.  Psychiatric: He has a normal mood and affect. His behavior is normal.  Nursing note and vitals reviewed.     Assessment & Plan:   Problem List Items Addressed This Visit      Cardiovascular and Mediastinum   Essential hypertension, benign    Other Visit Diagnoses    Recurrent falls    -  Primary   Relevant Orders   Ambulatory  referral to Physical Therapy   Ambulatory referral to Neurology   Balance disorder       Relevant Orders   Ambulatory referral to Physical Therapy   Ambulatory referral to Neurology       Follow up plan: Return in about 4 weeks (around 04/18/2017), or if symptoms worsen or fail to improve, for Hypertension and balance recheck.  Counseling provided for all of the vaccine components Orders Placed This Encounter  Procedures  . Ambulatory referral to Physical Therapy  . Ambulatory referral to Neurology    Caryl Pina, MD [facility] 03/21/2017, 11:56 AM

## 2017-03-23 DIAGNOSIS — C61 Malignant neoplasm of prostate: Secondary | ICD-10-CM | POA: Diagnosis not present

## 2017-03-30 ENCOUNTER — Ambulatory Visit (INDEPENDENT_AMBULATORY_CARE_PROVIDER_SITE_OTHER): Payer: Medicare Other | Admitting: Urology

## 2017-03-30 DIAGNOSIS — R351 Nocturia: Secondary | ICD-10-CM | POA: Diagnosis not present

## 2017-03-30 DIAGNOSIS — N5201 Erectile dysfunction due to arterial insufficiency: Secondary | ICD-10-CM

## 2017-03-30 DIAGNOSIS — C61 Malignant neoplasm of prostate: Secondary | ICD-10-CM

## 2017-04-04 ENCOUNTER — Encounter (HOSPITAL_COMMUNITY): Payer: Self-pay

## 2017-04-04 ENCOUNTER — Other Ambulatory Visit: Payer: Self-pay

## 2017-04-04 ENCOUNTER — Ambulatory Visit (HOSPITAL_COMMUNITY): Payer: Medicare Other | Attending: Family Medicine

## 2017-04-04 DIAGNOSIS — Z9181 History of falling: Secondary | ICD-10-CM | POA: Insufficient documentation

## 2017-04-04 DIAGNOSIS — M6281 Muscle weakness (generalized): Secondary | ICD-10-CM | POA: Diagnosis not present

## 2017-04-04 DIAGNOSIS — R2689 Other abnormalities of gait and mobility: Secondary | ICD-10-CM | POA: Insufficient documentation

## 2017-04-04 NOTE — Therapy (Signed)
[place], [place], [address] Phone: [phone]   Fax:  [phone]  Physical Therapy Evaluation  Patient Details  Name: [name] SCHLEICHER MRN: [id number] Date of Birth: Sep 08, 1945 Referring Provider: Caryl Pina   Encounter Date: 04/04/2017  PT End of Session - 04/04/17 1425    Visit Number  1    Number of Visits  9    Date for PT Re-Evaluation  05/02/17    Authorization Type  Medicare/Medicare Part A and B    Authorization Time Period  04/04/2017 - 05/30/2017    PT Start Time  1300    PT Stop Time  1345    PT Time Calculation (min)  45 min    Equipment Utilized During Treatment  Gait belt    Activity Tolerance  Patient tolerated treatment well    Behavior During Therapy  [facility] for tasks assessed/performed       Past Medical History:  Diagnosis Date  . Aortic atherosclerosis (HCC)    Penetrating aortic ulcer s/p endograft 2004 - followed by Dr. Trula Slade  . Arthritis   . COPD (chronic obstructive pulmonary disease) ([place])   . Coronary atherosclerosis of native coronary artery    Mild atherosclerosis at catheterization 2011  . Essential hypertension, benign   . GERD (gastroesophageal reflux disease)   . Gout   . History of kidney stones   . Neuropathy   . Prostate cancer ([place])    XRT  . Sleep apnea    Intolerant of CPAP  . Tubular adenoma of colon 08/2008    Past Surgical History:  Procedure Laterality Date  . Aortic endograft repair  2004   Dr. [name]  . CHEST TUBE INSERTION  1989   "collapsed lung"due to injury  . CIRCUMCISION  09/26/2011   Procedure: CIRCUMCISION ADULT;  Surgeon: [name], MD;  Location: [facility];  Service: Urology;  Laterality: N/A;  . COLONOSCOPY W/ POLYPECTOMY    . COLONOSCOPY WITH PROPOFOL N/A 04/27/2016   Procedure: COLONOSCOPY WITH PROPOFOL;  Surgeon: [name], MD;  [facility];  Service: Endoscopy;  Laterality: N/A;  1115-moved to 845 per Ginger  .  CYSTOSCOPY  09/26/2011   Procedure: CYSTOSCOPY;  Surgeon: [name], MD;  Location: [facility];  Service: Urology;  Laterality: N/A;  . ENDOVASCULAR STENT INSERTION  September 13, 2007   EVAR - Aortic stent graft  . ESOPHAGOGASTRODUODENOSCOPY (EGD) WITH PROPOFOL N/A 04/27/2016   Procedure: ESOPHAGOGASTRODUODENOSCOPY (EGD) WITH PROPOFOL;  Surgeon: [name], MD;  [facility];  Service: Endoscopy;  Laterality: N/A;  . EVAR    . FRACTURE SURGERY     Bilateral lower arms as a child  . KNEE ARTHROSCOPY     Left  . KNEE SURGERY  Sept. 26, 2013   Torn minisc.- Right  knee  . MALONEY DILATION N/A 04/27/2016   Procedure: Venia Minks DILATION;  Surgeon: [name], MD;  [facility];  Service: Endoscopy;  Laterality: N/A;  . PARTIAL KNEE ARTHROPLASTY Right 04/26/2015   Procedure: RIGHT KNEE MEDIAL UNICOMPARTMENTAL ARTHROPLASTY;  Surgeon: Gaynelle Arabian, MD;  Location: [facility];  Service: Orthopedics;  Laterality: Right;  . POLYPECTOMY  04/27/2016   Procedure: POLYPECTOMY;  Surgeon: [name], MD;  [facility];  Service: Endoscopy;;  cecal , ascending, descending, and sigmoid polypectomies  . PROSTATE BIOPSY  09/26/2011   Procedure: BIOPSY TRANSRECTAL ULTRASONIC PROSTATE (TUBP);  Surgeon: [name],  MD;  Location: [facility];  Service: Urology;  Laterality: N/A;     . UMBILICAL HERNIA REPAIR      There were no vitals filed for this visit.   Subjective Assessment - 04/04/17 1303    Subjective  Patient reports a 2-year or more history of falling. He reports he has fallen at least 5 times in the last year and that is most recent fall was on 11/18 /18 and resulted in a proximal radius fracture of his right UE. HE reports an x-ray at [facility] and a different office in [place] both verified the fracture and that he just got out of a cast last week and does not have any restriction. He reports he has broken ribs previously from falls and that when he stands up or  walks it feels like he is standing on small stones or on a hill. He thinks his toes catch on ground and feels like he shuffles his feet when he walks. HE reports he has neuropathy in both feet. He states he is supposed to follow up with his PCP sometime next week about his elbow and falling. He denies losing consciousness with falls and reports he has fallen in his home and outside. He feels like he cannot participate in the activities he enjoys and is reluctant to leave his home because he is scared he will fall. He is currently out of work due to his right radial fracture and is supposed to go back in 2 weeks. He works part-time as a Training and development officer at [employer]. He states he is scared to walk outside and is worried he will keep getting hurt. He would like to feel safe to garden again in the spring and be able to enjoy things like fishing again. He reports he has also had a ringing in his left ear that seems deep and that he has been referred to a neurologist to look into the falls as well.     Pertinent History  history of prostate cancer with steroid treatment, TKA, 5 falls within last year, most recent resulting in right elbow fracture, patient denies any restrictions or precautions     Limitations  Walking;House hold activities;Other (comment) stairs    How long can you sit comfortably?  unlimited    How long can you stand comfortably?  unlimited    How long can you walk comfortably?  can walk around a room but is afraid of walking farther distances and in the community    Patient Stated Goals  feel safe leaving his home again and not be fearful of falling    Currently in Pain?  No/denies    Multiple Pain Sites  No       [facility] PT Assessment - 04/04/17 0001      Assessment   Medical Diagnosis  History of falling, balance disorder    Referring Provider  [name], Vonna Kotyk    Onset Date/Surgical Date  -- history of falling for 2+ years    Hand Dominance  Right    Next MD Visit  next week    Prior Therapy   for TKA      Precautions   Precautions  None    Required Braces or Orthoses  -- pateint wearing wrist splint      Restrictions   Weight Bearing Restrictions  No      Balance Screen   Has the patient fallen in the past 6 months  Yes    How many times?  5 last year    Has the patient had a decrease in activity level because of a fear of falling?   Yes    Is the patient reluctant to leave their home because of a fear of falling?   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 [employer]    Leisure  walking, fishing, gardening has been limited over last 1-2 years due to fear of falling      Cognition   Overall Cognitive Status  Within Functional Limits for tasks assessed      Observation/Other Assessments   Focus on Therapeutic Outcomes (FOTO)   54% limited      Sensation   Light Touch  Appears Intact    Additional Comments  Patient reports history of neuropathy; states it feels like he is standing on a hill or on small stones when he standsup/walks      Functional Tests   Functional tests  Single leg stance      Single Leg Stance   Comments  RLE: 5 seconds; LLE: 7 seconds patient using contralateral leg to assist       Posture/Postural Control   Posture/Postural Control  Postural limitations    Postural Limitations  Rounded Shoulders;Forward head      Strength   Right Hip Flexion  5/5    Right Hip Extension  --  unable to test due to discomfort in prone    Right Hip ABduction  4/5 patient using TFL/psoas    Left Hip Flexion  5/5    Left Hip Extension  -- unable to test due to discomfort in prone    Left Hip ABduction  4/5 patient using TFL/psoas    Right Knee Flexion  5/5    Right Knee Extension  5/5    Left Knee Flexion  5/5    Left Knee Extension  5/5    Right Ankle Dorsiflexion  5/5    Left Ankle Dorsiflexion  5/5      Flexibility   Piriformis  50% limited left, 25% limited right      Ambulation/Gait   Ambulation Distance (Feet)  595 Feet    Gait Pattern  Step-through pattern;Decreased stride length;Poor foot clearance - right;Poor foot clearance - left;Wide base of support;Trendelenburg;Decreased hip/knee flexion - right;Decreased hip/knee flexion - left;Decreased dorsiflexion - right;Decreased dorsiflexion - left    Gait velocity  1 m/s    Stairs  Yes    Stairs Assistance  5: Supervision    Stairs Assistance Details (indicate cue type and reason)  guarding    Stair Management Technique  One rail Right;Alternating pattern;Step to pattern    Number of Stairs  6    Height of Stairs  4    Gait Comments  patient unsteady with obvious sway ascendig/descneidn stairs with no rail, patient descend with step to pattern with no rail and step over step with 1 hand rail, patient requires external support to prevent LOB      Standardized Balance Assessment   Standardized Balance Assessment  Dynamic Gait Index;Timed Up and Go Test       Objective measurements completed on examination: See above findings.    PT Education - 04/04/17 1424    Education provided  Yes    Education Details  Educated on benefits of PT and discussed POC. Patient is able to participate in 1x per week. Discussed goals and his overall baseline today and plan to progress his mobility, safety, and confidence.    Person(s) Educated  Patient    Methods  Explanation    Comprehension  Verbalized understanding       PT Short  Term Goals - 04/04/17 1510      PT SHORT TERM GOAL #1   Title  Patient will be independent with HEP particiaption and be able to demosntrate safe and appropriate form/technique wtih all exercises to improve independence with exercise and activity at home.    Time  2    Period  Weeks    Status  New    Target Date  04/18/17      PT SHORT TERM GOAL #2   Title  Patient with have 1/2 grade improvement in bilatearl hip abductor strength to improve functional statbility with singel limb stance activitites.    Time  4    Period  Weeks    Status  New    Target Date  05/02/17      PT SHORT TERM GOAL #3   Title  Patient with ascend/descend 4 6" steps with 1 hand rail and step over step pattern to improve independence with functional mobility.    Time  4    Period  Weeks    Status  New        PT Long Term Goals - 04/04/17 1519      PT LONG TERM GOAL #1   Title  Patient with ascend/descend 12x  6" steps with 1 hand rail and step over step pattern to improve independence with functional mobility.    Time  8    Period  Weeks    Status  New    Target Date  05/30/17      PT LONG TERM GOAL #2   Title  Patient will score a 20/24 on the DGI to demstrate improved balance and no longer be at an incresaed fall risk.    Time  8    Period  Weeks    Status  New      PT LONG TERM GOAL #3   Title  Patient will score equal to or above 80% on ABC scale to demonstrate improved confidence with balance activities and decreased fear of falling for improved QOL.    Time  8    Period  Weeks    Status  New       Plan - 04/04/17 1427    Clinical Impression Statement  Mr. O'Conner presents today for initial evaluation with significant history of falls resulting in injury. The most recent fall resulting in a fracture of the right proximal radius, per patient self-report. He denies any precautions with his forearm/RUE. Objective measures found weak hip musculature bilaterally, and balance testing revealed  difficulty with single limb stance activities. Current impairments include: decreased balance,  decreased activity tolerance, muscle weakness, decreased mobility, and impaired gait. He will benefit from skilled PT services to address current deficits and progress towards goals to decrease risk of falling and improve QOL.    History and Personal Factors relevant to plan of care:  history of falling, decreased balance, decreased activity tolerance, muscle weakness, decreased mobility, and impaired gait    Clinical Presentation  Stable    Clinical Decision Making  Low    Rehab Potential  Fair    Clinical Impairments Affecting Rehab Potential  fear of falling (-), supportive spouse (+), motivated (+)    PT Frequency  1x / week    PT Duration  8 weeks    PT Treatment/Interventions  ADLs/Self Care Home Management;Gait training;DME Instruction;Stair training;Functional mobility training;Therapeutic activities;Therapeutic exercise;Balance training;Neuromuscular re-education;Patient/family education;Passive range of motion    PT Next Visit Plan  Review Goals and Eval. Perform DGI, TUG, and perform ABC survey or Tinnetti Fall efficacy scale. Inititate SLS baalnce activities and glute med functional strengthening.    PT Home Exercise Plan  initiate next session: clamshell, bridges, NBOS and side stepping at counter    Consulted and Agree with Plan of Care  Patient       Patient will benefit from skilled therapeutic intervention in order to improve the following deficits and impairments:  Abnormal gait, Decreased knowledge of use of DME, Impaired sensation, Decreased mobility, Postural dysfunction, Decreased activity tolerance, Decreased endurance, Decreased strength, Impaired perceived functional ability, Decreased balance, Difficulty walking, Obesity  Visit Diagnosis: Abnormality of gait due to impairment of balance  History of falling  Balance problem  Muscle weakness (generalized)     Problem  List Patient Active Problem List   Diagnosis Date Noted  . Dysphagia 07/10/2016  . Diarrhea 07/10/2016  . BMI 40.0-44.9, adult ([place]) 05/31/2015  . Neuropathy 03/17/2015  . Prostate cancer ([place]) 06/30/2013  . Abdominal aneurysm without mention of rupture 02/19/2013  . Need for prophylactic vaccination and inoculation against influenza 02/03/2013  . ANXIETY 01/14/2013  . OBESITY NOS 01/14/2013  . VITAMIN B12 DEFICIENCY 01/14/2013  . TOBACCO ABUSE 01/14/2013  . PTSD 01/14/2013  . COPD (chronic obstructive pulmonary disease) ([place])   . OSA (obstructive sleep apnea) 08/14/2012  . RLS (restless legs syndrome) 08/14/2012  . Arthritis of both knees 08/14/2012  . Prediabetes 08/14/2012  . Nephrolithiasis 08/14/2012  . Aortic atherosclerosis ([place]) 02/19/2012  . Coronary atherosclerosis of native coronary artery 03/23/2010  . CHRONIC KIDNEY DISEASE STAGE II (MILD) 10/13/2009  . GERD 07/30/2008  . BENIGN PROSTATIC HYPERTROPHY, WITH OBSTRUCTION 02/20/2008  . Hyperlipidemia 01/31/2007  . GOUT 01/31/2007  . Essential hypertension, benign 01/31/2007    Kipp Brood, PT, DPT Physical Therapist with [facility]  04/04/2017 3:47 PM    [facility] [address] [place], [place], [address] Phone: [phone]   Fax:  [phone]  Name: NICOLUS OSE MRN: [id number] Date of Birth: [birth year]

## 2017-04-11 ENCOUNTER — Ambulatory Visit (HOSPITAL_COMMUNITY): Payer: Medicare Other

## 2017-04-11 ENCOUNTER — Encounter (HOSPITAL_COMMUNITY): Payer: Self-pay

## 2017-04-11 DIAGNOSIS — M6281 Muscle weakness (generalized): Secondary | ICD-10-CM

## 2017-04-11 DIAGNOSIS — Z9181 History of falling: Secondary | ICD-10-CM

## 2017-04-11 DIAGNOSIS — R2689 Other abnormalities of gait and mobility: Secondary | ICD-10-CM

## 2017-04-11 NOTE — Therapy (Signed)
[place], [place], [address] Phone: [phone]   Fax:  [phone]  Physical Therapy Treatment  Patient Details  Name: [name] MRN: [id number] Date of Birth: [birth year] Referring Provider: Caryl Pina   Encounter Date: 04/11/2017  PT End of Session - 04/11/17 1033    Visit Number  2    Number of Visits  9    Date for PT Re-Evaluation  05/02/17    Authorization Type  Medicare/Medicare Part A and B    Authorization Time Period  04/04/2017 - 05/30/2017    PT Start Time  0945    PT Stop Time  1030    PT Time Calculation (min)  45 min    Equipment Utilized During Treatment  Gait belt    Activity Tolerance  Patient tolerated treatment well;Patient limited by fatigue    Behavior During Therapy  [facility] for tasks assessed/performed       Past Medical History:  Diagnosis Date  . Aortic atherosclerosis (HCC)    Penetrating aortic ulcer s/p endograft 2004 - followed by Dr. Trula Slade  . Arthritis   . COPD (chronic obstructive pulmonary disease) ([place])   . Coronary atherosclerosis of native coronary artery    Mild atherosclerosis at catheterization 2011  . Essential hypertension, benign   . GERD (gastroesophageal reflux disease)   . Gout   . History of kidney stones   . Neuropathy   . Prostate cancer ([place])    XRT  . Sleep apnea    Intolerant of CPAP  . Tubular adenoma of colon 08/2008    Past Surgical History:  Procedure Laterality Date  . Aortic endograft repair  2004   Dr. [name]  . CHEST TUBE INSERTION  1989   "collapsed lung"due to injury  . CIRCUMCISION  09/26/2011   Procedure: CIRCUMCISION ADULT;  Surgeon: [name], MD;  [facility];  Service: Urology;  Laterality: N/A;  . COLONOSCOPY W/ POLYPECTOMY    . COLONOSCOPY WITH PROPOFOL N/A 04/27/2016   Procedure: COLONOSCOPY WITH PROPOFOL;  Surgeon: [name], MD;  [facility];  Service: Endoscopy;  Laterality: N/A;  1115-moved  to 845 per Ginger  . CYSTOSCOPY  09/26/2011   Procedure: CYSTOSCOPY;  Surgeon: [name], MD;  [facility];  Service: Urology;  Laterality: N/A;  . ENDOVASCULAR STENT INSERTION  September 13, 2007   EVAR - Aortic stent graft  . ESOPHAGOGASTRODUODENOSCOPY (EGD) WITH PROPOFOL N/A 04/27/2016   Procedure: ESOPHAGOGASTRODUODENOSCOPY (EGD) WITH PROPOFOL;  Surgeon: [name], MD;  [facility];  Service: Endoscopy;  Laterality: N/A;  . EVAR    . FRACTURE SURGERY     Bilateral lower arms as a child  . KNEE ARTHROSCOPY     Left  . KNEE SURGERY  Sept. 26, 2013   Torn minisc.- Right  knee  . MALONEY DILATION N/A 04/27/2016   Procedure: Venia Minks DILATION;  Surgeon: [name], MD;  [facility];  Service: Endoscopy;  Laterality: N/A;  . PARTIAL KNEE ARTHROPLASTY Right 04/26/2015   Procedure: RIGHT KNEE MEDIAL UNICOMPARTMENTAL ARTHROPLASTY;  Surgeon: Gaynelle Arabian, MD;  [facility];  Service: Orthopedics;  Laterality: Right;  . POLYPECTOMY  04/27/2016   Procedure: POLYPECTOMY;  Surgeon: [name], MD;  [facility];  Service: Endoscopy;;  cecal , ascending, descending, and sigmoid polypectomies  . PROSTATE BIOPSY  09/26/2011   Procedure: BIOPSY TRANSRECTAL ULTRASONIC PROSTATE (TUBP);  Surgeon:  [name], MD;  [facility];  Service: Urology;  Laterality: N/A;     . UMBILICAL HERNIA REPAIR      There were no vitals filed for this visit.  Subjective Assessment - 04/11/17 0950    Subjective  Pt stated he is tired this morning, stayed up too late last night and had difficulty go to sleep.  No reports of recent falls but has has had close calls on uneven surfaces (gravel)    Pertinent History  history of prostate cancer with steroid treatment, TKA, 5 falls within last year, most recent resulting in right elbow fracture, patient denies any restrictions or precautions     Limitations  Walking;House hold activities;Other (comment)    Patient Stated  Goals  feel safe leaving his home again and not be fearful of falling    Currently in Pain?  No/denies         [facility] PT Assessment - 04/11/17 0001      Assessment   Medical Diagnosis  History of falling, balance disorder    Referring Provider  [name], Vonna Kotyk    Onset Date/Surgical Date  -- history of falling for 2+ years    Hand Dominance  Right    Next MD Visit  04/21/17    Prior Therapy  for TKA      Precautions   Precautions  None      Observation/Other Assessments   Other Surveys   Other Surveys  (Pended)       Standardized Balance Assessment   Standardized Balance Assessment  Dynamic Gait Index;Timed Up and Go Test      Dynamic Gait Index   Level Surface  Normal    Change in Gait Speed  Mild Impairment    Gait with Horizontal Head Turns  Mild Impairment    Gait with Vertical Head Turns  Mild Impairment    Gait and Pivot Turn  Mild Impairment    Step Over Obstacle  Mild Impairment    Step Around Obstacles  Mild Impairment    Steps  Mild Impairment    Total Score  17      Timed Up and Go Test   TUG  Normal TUG    Normal TUG (seconds)  11.86                  [facility] Adult PT Treatment/Exercise - 04/11/17 0001      Exercises   Exercises  Knee/Hip      Knee/Hip Exercises: Supine   Bridges  10 reps      Knee/Hip Exercises: Sidelying   Clams  BLE RTB 10x each          Balance Exercises - 04/11/17 1106      Balance Exercises: Standing   Standing Eyes Opened  Narrow base of support (BOS);2 reps;Foam/compliant surface;30 secs 1st) solid then on foam    Sidestepping  1 rep;Theraband RTB          PT Short Term Goals - 04/04/17 1510      PT SHORT TERM GOAL #1   Title  Patient will be independent with HEP particiaption and be able to demosntrate safe and appropriate form/technique wtih all exercises to improve independence with exercise and activity at home.    Time  2    Period  Weeks    Status  New    Target Date  04/18/17      PT SHORT  TERM GOAL #2   Title  Patient with have 1/2 grade improvement in bilatearl hip abductor strength to improve functional statbility with singel limb stance activitites.    Time  4    Period  Weeks    Status  New    Target Date  05/02/17      PT SHORT TERM GOAL #3   Title  Patient with ascend/descend 4 6" steps with 1 hand rail and step over step pattern to improve independence with functional mobility.    Time  4    Period  Weeks    Status  New        PT Long Term Goals - 04/04/17 1519      PT LONG TERM GOAL #1   Title  Patient with ascend/descend 12x  6" steps with 1 hand rail and step over step pattern to improve independence with functional mobility.    Time  8    Period  Weeks    Status  New    Target Date  05/30/17      PT LONG TERM GOAL #2   Title  Patient will score a 20/24 on the DGI to demstrate improved balance and no longer be at an incresaed fall risk.    Time  8    Period  Weeks    Status  New      PT LONG TERM GOAL #3   Title  Patient will score equal to or above 80% on ABC scale to demonstrate improved confidence with balance activities and decreased fear of falling for improved QOL.    Time  8    Period  Weeks    Status  New            Plan - 04/11/17 1215    Clinical Impression Statement  Reviewed goals, copy of eval given to pt and began session discussing confidence with balance (46% per ABC scale).  DGI complete with score of 17 and TUG complete in 11.86" with no AD.  Pt reports dizziness during TUG due to looking down at cone, no other c/o dizziness thorugh session.  Established HEP to address hip musculature weakness and safe balance activities, pt able to demonstrate and verbalize understanding of exercises and importance of adherense to HEP for maximal benefits.  Used gait belt for safety during dynamic balance activities.  No reports of pain through session. was limited by fatigue with tasks.      Rehab Potential  Fair    Clinical Impairments  Affecting Rehab Potential  fear of falling (-), supportive spouse (+), motivated (+)    PT Frequency  1x / week    PT Duration  8 weeks    PT Treatment/Interventions  ADLs/Self Care Home Management;Gait training;DME Instruction;Stair training;Functional mobility training;Therapeutic activities;Therapeutic exercise;Balance training;Neuromuscular re-education;Patient/family education;Passive range of motion    PT Next Visit Plan  Next session begin SLS balance activities and glut med functional strengthening.  Progress as able.      PT Home Exercise Plan  clamshell RTB, bridges, NBOS and side stepping at counter       Patient will benefit from skilled therapeutic intervention in order to improve the following deficits and impairments:  Abnormal gait, Decreased knowledge of use of DME, Impaired sensation, Decreased mobility, Postural dysfunction, Decreased activity tolerance, Decreased endurance, Decreased strength, Impaired perceived functional ability, Decreased balance, Difficulty walking, Obesity  Visit Diagnosis: History of falling  Abnormality of gait due to impairment of balance  Balance problem  Muscle weakness (generalized)  Problem List Patient Active Problem List   Diagnosis Date Noted  . Dysphagia 07/10/2016  . Diarrhea 07/10/2016  . BMI 40.0-44.9, adult ([place]) 05/31/2015  . Neuropathy 03/17/2015  . Prostate cancer ([place]) 06/30/2013  . Abdominal aneurysm without mention of rupture 02/19/2013  . Need for prophylactic vaccination and inoculation against influenza 02/03/2013  . ANXIETY 01/14/2013  . OBESITY NOS 01/14/2013  . VITAMIN B12 DEFICIENCY 01/14/2013  . TOBACCO ABUSE 01/14/2013  . PTSD 01/14/2013  . COPD (chronic obstructive pulmonary disease) ([place])   . OSA (obstructive sleep apnea) 08/14/2012  . RLS (restless legs syndrome) 08/14/2012  . Arthritis of both knees 08/14/2012  . Prediabetes 08/14/2012  . Nephrolithiasis 08/14/2012  . Aortic atherosclerosis  ([place]) 02/19/2012  . Coronary atherosclerosis of native coronary artery 03/23/2010  . CHRONIC KIDNEY DISEASE STAGE II (MILD) 10/13/2009  . GERD 07/30/2008  . BENIGN PROSTATIC HYPERTROPHY, WITH OBSTRUCTION 02/20/2008  . Hyperlipidemia 01/31/2007  . GOUT 01/31/2007  . Essential hypertension, benign 01/31/2007   Ihor Austin, [place]; [place]  Aldona Lento 04/11/2017, 12:28 PM  [place] [address] [place], [place], [address] Phone: [phone]   Fax:  [phone]  Name: [name] MRN: [id number] Date of Birth: 02/20/1946

## 2017-04-11 NOTE — Patient Instructions (Signed)
Bridging    Slowly raise buttocks from floor, keeping stomach tight. Repeat 10-15 times per set. Do 1 sets per session. Do 1-2  sessions per day.  http://orth.exer.us/1096   Copyright  VHI. All rights reserved.   Clam Shell 45 Degrees    Lying with hips and knees bent 45, place red theraband around knees. Lift knee. Be sure pelvis does not roll backward. Do not arch back. Do 10-15 times, each leg, 1-2 times per day.  http://ss.exer.us/74   Copyright  VHI. All rights reserved.   Balance: Stand in front of counter with feet touching, try not to hold on with hands.  Hold for 30-60 seconds  Band Walk: Side Stepping    Tie band around legs, just above knees. Step 10 feet to one side, then step back to start. Repeat ___ feet per session. Note: Small towel between band and skin eases rubbing.  http://plyo.exer.us/76   Copyright  VHI. All rights reserved.

## 2017-04-18 ENCOUNTER — Ambulatory Visit (HOSPITAL_COMMUNITY): Payer: Medicare Other

## 2017-04-18 ENCOUNTER — Ambulatory Visit (INDEPENDENT_AMBULATORY_CARE_PROVIDER_SITE_OTHER): Payer: Medicare Other | Admitting: *Deleted

## 2017-04-18 ENCOUNTER — Encounter (HOSPITAL_COMMUNITY): Payer: Self-pay

## 2017-04-18 ENCOUNTER — Encounter: Payer: Self-pay | Admitting: *Deleted

## 2017-04-18 VITALS — BP 151/100 | HR 72

## 2017-04-18 VITALS — BP 130/72 | HR 66 | Ht 68.0 in | Wt 272.0 lb

## 2017-04-18 DIAGNOSIS — M6281 Muscle weakness (generalized): Secondary | ICD-10-CM | POA: Diagnosis not present

## 2017-04-18 DIAGNOSIS — Z0000 Encounter for general adult medical examination without abnormal findings: Secondary | ICD-10-CM | POA: Diagnosis not present

## 2017-04-18 DIAGNOSIS — Z9181 History of falling: Secondary | ICD-10-CM | POA: Diagnosis not present

## 2017-04-18 DIAGNOSIS — R2689 Other abnormalities of gait and mobility: Secondary | ICD-10-CM | POA: Diagnosis not present

## 2017-04-18 NOTE — Patient Instructions (Addendum)
  [name] , Thank you for taking time to come for your Medicare Wellness Visit. I appreciate your ongoing commitment to your health goals. Please review the following plan we discussed and let me know if I can assist you in the future.   These are the goals we discussed:  Goals    . DIET - DECREASE SODA OR JUICE INTAKE       This is a list of the screening recommended for you and due dates:  Health Maintenance  Topic Date Due  .  Hepatitis C: One time screening is recommended by Center for Disease Control  (CDC) for  adults born from [birth year] through 1966.   1945/06/04  . Tetanus Vaccine  03/21/2018*  . Pneumonia vaccines (2 of 2 - PPSV23) 03/21/2018*  . Colon Cancer Screening  04/27/2026  . Flu Shot  Completed  *Topic was postponed. The date shown is not the original due date.

## 2017-04-18 NOTE — Therapy (Signed)
[place], [place], [address] Phone: [phone]   Fax:  [phone]  Physical Therapy Treatment  Patient Details  Name: [name] MRN: [id number] Date of Birth: [birth year] Referring Provider: Caryl Pina   Encounter Date: 04/18/2017  PT End of Session - 04/18/17 1118    Visit Number  3    Number of Visits  9    Date for PT Re-Evaluation  05/02/17    Authorization Type  Medicare/Medicare Part A and B    Authorization Time Period  04/04/2017 - 05/30/2017    PT Start Time  1117    PT Stop Time  1155    PT Time Calculation (min)  38 min    Equipment Utilized During Treatment  Gait belt    Activity Tolerance  Patient tolerated treatment well;Patient limited by fatigue    Behavior During Therapy  [facility] for tasks assessed/performed       Past Medical History:  Diagnosis Date  . Aortic atherosclerosis (HCC)    Penetrating aortic ulcer s/p endograft 2004 - followed by Dr. Trula Slade  . Arthritis   . COPD (chronic obstructive pulmonary disease) ([place])   . Coronary atherosclerosis of native coronary artery    Mild atherosclerosis at catheterization 2011  . Essential hypertension, benign   . GERD (gastroesophageal reflux disease)   . Gout   . History of kidney stones   . Neuropathy   . Prostate cancer ([place])    XRT  . Sleep apnea    Intolerant of CPAP  . Tubular adenoma of colon 08/2008    Past Surgical History:  Procedure Laterality Date  . Aortic endograft repair  2004   Dr. [name]  . CHEST TUBE INSERTION  1989   "collapsed lung"due to injury  . CIRCUMCISION  09/26/2011   Procedure: CIRCUMCISION ADULT;  Surgeon: [name], MD;  [facility];  Service: Urology;  Laterality: N/A;  . COLONOSCOPY W/ POLYPECTOMY    . COLONOSCOPY WITH PROPOFOL N/A 04/27/2016   Procedure: COLONOSCOPY WITH PROPOFOL;  Surgeon: [name], MD;  [facility];  Service: Endoscopy;  Laterality: N/A;   1115-moved to 845 per Ginger  . CYSTOSCOPY  09/26/2011   Procedure: CYSTOSCOPY;  Surgeon: [name], MD;  [facility];  Service: Urology;  Laterality: N/A;  . ENDOVASCULAR STENT INSERTION  September 13, 2007   EVAR - Aortic stent graft  . ESOPHAGOGASTRODUODENOSCOPY (EGD) WITH PROPOFOL N/A 04/27/2016   Procedure: ESOPHAGOGASTRODUODENOSCOPY (EGD) WITH PROPOFOL;  Surgeon: [name], MD;  [facility];  Service: Endoscopy;  Laterality: N/A;  . EVAR    . FRACTURE SURGERY     Bilateral lower arms as a child  . KNEE ARTHROSCOPY     Left  . KNEE SURGERY  Sept. 26, 2013   Torn minisc.- Right  knee  . MALONEY DILATION N/A 04/27/2016   Procedure: Venia Minks DILATION;  Surgeon: [name], MD;  [facility];  Service: Endoscopy;  Laterality: N/A;  . PARTIAL KNEE ARTHROPLASTY Right 04/26/2015   Procedure: RIGHT KNEE MEDIAL UNICOMPARTMENTAL ARTHROPLASTY;  Surgeon: Gaynelle Arabian, MD;  [facility];  Service: Orthopedics;  Laterality: Right;  . POLYPECTOMY  04/27/2016   Procedure: POLYPECTOMY;  Surgeon: [name], MD;  [facility];  Service: Endoscopy;;  cecal , ascending, descending, and sigmoid polypectomies  . PROSTATE BIOPSY  09/26/2011   Procedure: BIOPSY TRANSRECTAL ULTRASONIC PROSTATE (TUBP);  Surgeon:  [name], MD;  [facility];  Service: Urology;  Laterality: N/A;     . UMBILICAL HERNIA REPAIR      Vitals:   04/18/17 1137  BP: (!) 151/100  Pulse: 72    Subjective Assessment - 04/18/17 1117    Subjective  Pt stated he is feeling good today, no reports of pain or recent fall.  Reports compliance with HEP daily.      Pertinent History  history of prostate cancer with steroid treatment, TKA, 5 falls within last year, most recent resulting in right elbow fracture, patient denies any restrictions or precautions     Patient Stated Goals  feel safe leaving his home again and not be fearful of falling    Currently in Pain?  No/denies                       [facility] Adult PT Treatment/Exercise - 04/18/17 0001      Knee/Hip Exercises: Standing   Heel Raises  15 reps;Limitations Toe raises 15x    Heel Raises Limitations  Toe raises 15x    Rocker Board  2 minutes      Knee/Hip Exercises: Seated   Sit to Sand  10 reps;without UE support      Knee/Hip Exercises: Supine   Bridges  15 reps      Knee/Hip Exercises: Sidelying   Hip ABduction  10 reps    Clams  BLE GTB 10x each          Balance Exercises - 04/18/17 1148      Balance Exercises: Standing   Tandem Stance  Eyes open;Foam/compliant surface;2 reps;30 secs on foam    Rockerboard  Lateral;UE support Lateral; DF/PF    Sidestepping  1 rep;Theraband GTB          PT Short Term Goals - 04/04/17 1510      PT SHORT TERM GOAL #1   Title  Patient will be independent with HEP particiaption and be able to demosntrate safe and appropriate form/technique wtih all exercises to improve independence with exercise and activity at home.    Time  2    Period  Weeks    Status  New    Target Date  04/18/17      PT SHORT TERM GOAL #2   Title  Patient with have 1/2 grade improvement in bilatearl hip abductor strength to improve functional statbility with singel limb stance activitites.    Time  4    Period  Weeks    Status  New    Target Date  05/02/17      PT SHORT TERM GOAL #3   Title  Patient with ascend/descend 4 6" steps with 1 hand rail and step over step pattern to improve independence with functional mobility.    Time  4    Period  Weeks    Status  New        PT Long Term Goals - 04/04/17 1519      PT LONG TERM GOAL #1   Title  Patient with ascend/descend 12x  6" steps with 1 hand rail and step over step pattern to improve independence with functional mobility.    Time  8    Period  Weeks    Status  New    Target Date  05/30/17      PT LONG TERM GOAL #2   Title  Patient will score a 20/24 on the DGI to demstrate  improved balance and no  longer be at an incresaed fall risk.    Time  8    Period  Weeks    Status  New      PT LONG TERM GOAL #3   Title  Patient will score equal to or above 80% on ABC scale to demonstrate improved confidence with balance activities and decreased fear of falling for improved QOL.    Time  8    Period  Weeks    Status  New            Plan - 04/18/17 1203    Clinical Impression Statement  Session focus on gluteal strengthening, pre-gait LE strengthening and balance training.  Added exercises with primary glut med and max strengthening exercises and static form on dynamic surface for balalnce training.  Pt was limited by fatigue with activities, no reports of pain.      Rehab Potential  Fair    Clinical Impairments Affecting Rehab Potential  fear of falling (-), supportive spouse (+), motivated (+)    PT Frequency  1x / week    PT Duration  8 weeks    PT Treatment/Interventions  ADLs/Self Care Home Management;Gait training;DME Instruction;Stair training;Functional mobility training;Therapeutic activities;Therapeutic exercise;Balance training;Neuromuscular re-education;Patient/family education;Passive range of motion    PT Next Visit Plan  Next session begin SLS balance activities and glut med functional strengthening.  Progress as able.      PT Home Exercise Plan  clamshell RTB, bridges, NBOS and side stepping at counter       Patient will benefit from skilled therapeutic intervention in order to improve the following deficits and impairments:  Abnormal gait, Decreased knowledge of use of DME, Impaired sensation, Decreased mobility, Postural dysfunction, Decreased activity tolerance, Decreased endurance, Decreased strength, Impaired perceived functional ability, Decreased balance, Difficulty walking, Obesity  Visit Diagnosis: History of falling  Abnormality of gait due to impairment of balance  Balance problem  Muscle weakness (generalized)     Problem List Patient Active Problem  List   Diagnosis Date Noted  . Dysphagia 07/10/2016  . Diarrhea 07/10/2016  . BMI 40.0-44.9, adult ([place]) 05/31/2015  . Neuropathy 03/17/2015  . Prostate cancer ([place]) 06/30/2013  . Abdominal aneurysm without mention of rupture 02/19/2013  . Need for prophylactic vaccination and inoculation against influenza 02/03/2013  . ANXIETY 01/14/2013  . OBESITY NOS 01/14/2013  . VITAMIN B12 DEFICIENCY 01/14/2013  . TOBACCO ABUSE 01/14/2013  . PTSD 01/14/2013  . COPD (chronic obstructive pulmonary disease) ([place])   . OSA (obstructive sleep apnea) 08/14/2012  . RLS (restless legs syndrome) 08/14/2012  . Arthritis of both knees 08/14/2012  . Prediabetes 08/14/2012  . Nephrolithiasis 08/14/2012  . Aortic atherosclerosis ([place]) 02/19/2012  . Coronary atherosclerosis of native coronary artery 03/23/2010  . CHRONIC KIDNEY DISEASE STAGE II (MILD) 10/13/2009  . GERD 07/30/2008  . BENIGN PROSTATIC HYPERTROPHY, WITH OBSTRUCTION 02/20/2008  . Hyperlipidemia 01/31/2007  . GOUT 01/31/2007  . Essential hypertension, benign 01/31/2007   Ihor Austin, [place]; [place]  Aldona Lento 04/18/2017, 12:05 PM  [place], [place], [address] Phone: [phone]   Fax:  [phone]  Name: CHIPPER KOUDELKA MRN: [id number] Date of Birth: Oct 26, 1945

## 2017-04-20 NOTE — Progress Notes (Signed)
Subjective:   [name] is a 72 y.o. male who presents for an Initial Medicare Annual Wellness Visit. Patient is retired from the department of defense. He lives in a one story home with his wife. He moved from [facility] about 9 years ago. They do not have family on the [place] and although he is very outgoing he feels socially isolated. He isn't a member of any groups or churches. He has 2 adult sons, one adult daughter, and 8 grandchildren.   Review of Systems  Health is about the same as last year.   Cardiac Risk Factors include: obesity (BMI >30kg/m2);sedentary lifestyle;smoking/ tobacco exposure;hypertension;family history of premature cardiovascular disease  Other systems negative.    Objective:    Today's Vitals   04/18/17 1409  BP: 130/72  Pulse: 66  Weight: 272 lb (123.4 kg)  Height: 5\' 8"  (1.727 m)   Body mass index is 41.36 kg/m.  Advanced Directives 04/18/2017 04/18/2017 04/11/2017 04/04/2017 02/18/2017 04/27/2016 04/25/2016  Does Patient Have a Medical Advance Directive? No No No No No No No  Would patient like information on creating a medical advance directive? No - Patient declined No - Patient declined No - Patient declined No - Patient declined No - Patient declined No - Patient declined No - Patient declined    Current Medications (verified) Outpatient Encounter Medications as of 04/18/2017  Medication Sig  . allopurinol (ZYLOPRIM) 300 MG tablet TAKE 1 TABLET BY MOUTH ONCE DAILY  . amLODipine (NORVASC) 10 MG tablet TAKE ONE TABLET BY MOUTH ONCE DAILY  . DULoxetine (CYMBALTA) 30 MG capsule TAKE 1 CAPSULE BY MOUTH ONCE DAILY  . losartan (COZAAR) 50 MG tablet TAKE 1 TABLET BY MOUTH ONCE DAILY  . mirabegron ER (MYRBETRIQ) 25 MG TB24 tablet Take 25 mg by mouth daily.  . pantoprazole (PROTONIX) 40 MG tablet TAKE ONE TABLET BY MOUTH TWICE DAILY  . carvedilol (COREG) 25 MG tablet Take 1 tablet (25 mg total) by mouth 2 (two) times daily.   No  facility-administered encounter medications on file as of 04/18/2017.     Allergies (verified) Carbidopa-levodopa; Morphine sulfate; and Sulfacetamide sodium   History: Past Medical History:  Diagnosis Date  . Aortic atherosclerosis (HCC)    Penetrating aortic ulcer s/p endograft 2004 - followed by Dr. Trula Slade  . Arthritis   . COPD (chronic obstructive pulmonary disease) ([place])   . Coronary atherosclerosis of native coronary artery    Mild atherosclerosis at catheterization 2011  . Essential hypertension, benign   . GERD (gastroesophageal reflux disease)   . Gout   . History of kidney stones   . Neuropathy   . Prostate cancer ([place])    XRT  . Sleep apnea    Intolerant of CPAP  . Tubular adenoma of colon 08/2008   Past Surgical History:  Procedure Laterality Date  . Aortic endograft repair  2004   Dr. [name]  . CHEST TUBE INSERTION  1989   "collapsed lung"due to injury  . CIRCUMCISION  09/26/2011   Procedure: CIRCUMCISION ADULT;  Surgeon: [name], MD;  [facility];  Service: Urology;  Laterality: N/A;  . COLONOSCOPY W/ POLYPECTOMY    . COLONOSCOPY WITH PROPOFOL N/A 04/27/2016   Procedure: COLONOSCOPY WITH PROPOFOL;  Surgeon: [name], MD;  [facility];  Service: Endoscopy;  Laterality: N/A;  1115-moved to 845 per Ginger  . CYSTOSCOPY  09/26/2011   Procedure: CYSTOSCOPY;  Surgeon: [name], MD;  [facility];  Service: Urology;  Laterality: N/A;  . ENDOVASCULAR STENT INSERTION  September 13, 2007   EVAR - Aortic stent graft  . ESOPHAGOGASTRODUODENOSCOPY (EGD) WITH PROPOFOL N/A 04/27/2016   Procedure: ESOPHAGOGASTRODUODENOSCOPY (EGD) WITH PROPOFOL;  Surgeon: [name], MD;  [facility];  Service: Endoscopy;  Laterality: N/A;  . EVAR    . FRACTURE SURGERY     Bilateral lower arms as a child  . KNEE ARTHROSCOPY     Left  . KNEE SURGERY  Sept. 26, 2013   Torn minisc.- Right  knee  . MALONEY DILATION N/A 04/27/2016   Procedure: Venia Minks  DILATION;  Surgeon: [name], MD;  [facility];  Service: Endoscopy;  Laterality: N/A;  . PARTIAL KNEE ARTHROPLASTY Right 04/26/2015   Procedure: RIGHT KNEE MEDIAL UNICOMPARTMENTAL ARTHROPLASTY;  Surgeon: Gaynelle Arabian, MD;  [facility];  Service: Orthopedics;  Laterality: Right;  . POLYPECTOMY  04/27/2016   Procedure: POLYPECTOMY;  Surgeon: [name], MD;  [facility];  Service: Endoscopy;;  cecal , ascending, descending, and sigmoid polypectomies  . PROSTATE BIOPSY  09/26/2011   Procedure: BIOPSY TRANSRECTAL ULTRASONIC PROSTATE (TUBP);  Surgeon: [name], MD;  [facility];  Service: Urology;  Laterality: N/A;     . UMBILICAL HERNIA REPAIR     Family History  Problem Relation Age of Onset  . Stroke Mother   . Alzheimer's disease Mother 45       probably due to head injury  . Mesothelioma Father   . Cancer Father        mesothelioma  . Hyperlipidemia Sister   . Heart attack Sister   . Cancer Brother 64  . Heart disease Brother   . Hyperlipidemia Brother   . Cancer Brother        kidney  . Healthy Daughter   . Healthy Son   . Healthy Son   . Colon cancer Neg Hx    Social History   Socioeconomic History  . Marital status: Married    Spouse name: Not on file  . Number of children: 3  . Years of education: 28  . Highest education level: Some college, no degree  Social Needs  . Financial resource strain: Not hard at all  . Food insecurity - worry: Never true  . Food insecurity - inability: Never true  . Transportation needs - medical: No  . Transportation needs - non-medical: No  Occupational History  . Occupation: Retired    Comment: Hotel manager    Comment: Part Time at [employer]  . Smoking status: Current Every Day Smoker    Packs/day: 0.50    Years: 42.00    Pack years: 21.00    Types: Cigarettes  . Smokeless tobacco: Never Used  . Tobacco comment: 12 ciggs per day  Substance and Sexual Activity    . Alcohol use: No    Alcohol/week: 0.0 oz  . Drug use: No  . Sexual activity: Yes    Birth control/protection: None  Other Topics Concern  . Not on file  Social History Narrative   Patient is retired from the department of defense. He lives in a one story home with his wife. He moved from [facility] about 9 years ago. They do not have family on the [place] and although he is very outgoing he feels socially isolated. He isn't a member of any groups or churches.    Tobacco Counseling Ready to quit:  Not Answered Counseling given: Not Answered Comment: 12 ciggs per day  Clinical Intake:    Pain : No/denies pain    Nutritional Status: BMI > 30  Obese Nutritional Risks: Unintentional weight gain(due to prednisone) Diabetes: No  How often do you need to have someone help you when you read instructions, pamphlets, or other written materials from your doctor or pharmacy?: 1 - Never What is the last grade level you completed in school?: 12 th grade and some college  Interpreter Needed?: No  Information entered by :: Chong Sicilian, RN  Activities of Daily Living In your present state of health, do you have any difficulty performing the following activities: 04/18/2017 04/25/2016  Hearing? N N  Vision? N N  Comment Last exam was around 2 years ago -  Difficulty concentrating or making decisions? N N  Walking or climbing stairs? Y Y  Comment Has some difficulty but is ok if he has a railing. He has 6 steps up into his one story home.  -  Dressing or bathing? N N  Doing errands, shopping? N N  Preparing Food and eating ? N -  Using the Toilet? N -  In the past six months, have you accidently leaked urine? N -  Do you have problems with loss of bowel control? N -  Managing your Medications? N -  Comment Keeps them in a pill box -  Managing your Finances? N -  Housekeeping or managing your Housekeeping? N -  Some recent data might be hidden     Immunizations and Health  Maintenance Immunization History  Administered Date(s) Administered  . Influenza Whole 01/31/2007, 01/20/2008  . Influenza,inj,Quad PF,6+ Mos 02/03/2013, 12/30/2014  . Influenza-Unspecified 01/14/2014, 12/29/2015, 01/20/2017  . Pneumococcal Conjugate-13 12/30/2014   Health Maintenance Due  Topic Date Due  . Hepatitis C Screening  June 16, 1945    Patient Care Team: [name], [name], MD as PCP - General (Family Medicine) Satira Sark, MD as Consulting Physician (Cardiology)  ED visit 02/18/2017 for wrist injury. No hospitalizations and no surgeries this past year.     Assessment:   This is a routine wellness examination for [place].  Hearing/Vision screen No deficits noted during visit.   Dietary issues and exercise activities discussed: Current Exercise Habits: The patient does not participate in regular exercise at present(goint to physical therapy once a week for frequent falls), Exercise limited by: orthopedic condition(s)  Goals    . DIET - DECREASE SODA OR JUICE INTAKE      Depression Screen PHQ 2/9 Scores 04/18/2017 03/21/2017 03/06/2017 02/26/2017  PHQ - 2 Score 4 1 0 0  PHQ- 9 Score 8 - - -    Fall Risk Fall Risk  04/18/2017 03/21/2017 03/06/2017 02/26/2017 09/11/2016  Falls in the past year? Yes Yes Yes No Yes  Number falls in past yr: 2 or more 2 or more 2 or more - 2 or more  Injury with Fall? Yes Yes Yes - Yes  Comment - fractured rib/elbow - - fractured rib  Risk Factor Category  High Fall Risk - - - -  Risk for fall due to : History of fall(s) - - - -  Follow up Falls prevention discussed - - - -  Comment has been evaluated and is in physical therapy for gait training - - - -   Cognitive Function: MMSE - Mini Mental State Exam 04/18/2017 12/30/2014  Orientation to time 5 5  Orientation to Place 5 5  Registration 3  3  Attention/ Calculation 5 4  Recall 3 3  Language- name 2 objects 2 2  Language- repeat 1 1  Language- follow 3 step command 3 3    Language- read & follow direction 1 1  Write a sentence 1 1  Copy design 1 1  Total score 30 29    normal exam    Screening Tests Health Maintenance  Topic Date Due  . Hepatitis C Screening  1945-05-04  . TETANUS/TDAP  03/21/2018 (Originally 02[birth year]06/1964)  . PNA vac Low Risk Adult (2 of 2 - PPSV23) 03/21/2018 (Originally 12/30/2015)  . COLONOSCOPY  04/27/2026  . INFLUENZA VACCINE  Completed   Plan:  Decrease soda intake Increase activity level. Aim for 150 min of moderate activity a week. Keep f/u with PCP Recommended joining [facility] for exercise and socialization.   I have personally reviewed and noted the following in the patient's chart:   . Medical and social history . Use of alcohol, tobacco or illicit drugs  . Current medications and supplements . Functional ability and status . Nutritional status . Physical activity . Advanced directives . List of other physicians . Hospitalizations, surgeries, and ER visits in previous 12 months . Vitals . Screenings to include cognitive, depression, and falls . Referrals and appointments  In addition, I have reviewed and discussed with patient certain preventive protocols, quality metrics, and best practice recommendations. A written personalized care plan for preventive services as well as general preventive health recommendations were provided to patient.     Chong Sicilian, RN   04/20/2017

## 2017-04-23 DIAGNOSIS — S52134D Nondisplaced fracture of neck of right radius, subsequent encounter for closed fracture with routine healing: Secondary | ICD-10-CM | POA: Diagnosis not present

## 2017-04-23 DIAGNOSIS — M25531 Pain in right wrist: Secondary | ICD-10-CM | POA: Diagnosis not present

## 2017-04-25 ENCOUNTER — Ambulatory Visit: Payer: Medicare Other | Admitting: Family Medicine

## 2017-04-25 ENCOUNTER — Telehealth (HOSPITAL_COMMUNITY): Payer: Self-pay | Admitting: Family Medicine

## 2017-04-25 ENCOUNTER — Ambulatory Visit (HOSPITAL_COMMUNITY): Payer: Medicare Other

## 2017-04-25 NOTE — Telephone Encounter (Signed)
04/25/17  pt left a message to cx - said he would have to take his wife to work at noon because her car died in the street

## 2017-04-30 ENCOUNTER — Other Ambulatory Visit: Payer: Self-pay | Admitting: Family Medicine

## 2017-05-02 ENCOUNTER — Other Ambulatory Visit: Payer: Self-pay

## 2017-05-02 ENCOUNTER — Encounter: Payer: Self-pay | Admitting: *Deleted

## 2017-05-02 ENCOUNTER — Ambulatory Visit (HOSPITAL_COMMUNITY): Payer: Medicare Other

## 2017-05-02 ENCOUNTER — Encounter (HOSPITAL_COMMUNITY): Payer: Self-pay

## 2017-05-02 DIAGNOSIS — M6281 Muscle weakness (generalized): Secondary | ICD-10-CM | POA: Diagnosis not present

## 2017-05-02 DIAGNOSIS — R2689 Other abnormalities of gait and mobility: Secondary | ICD-10-CM

## 2017-05-02 DIAGNOSIS — Z9181 History of falling: Secondary | ICD-10-CM | POA: Diagnosis not present

## 2017-05-02 NOTE — Therapy (Signed)
[place], [place], [address] Phone: [phone]   Fax:  [phone]  Physical Therapy Treatment/Re-Assessment  Patient Details  Name: [name] MRN: [id number] Date of Birth: 29-Apr-1945 Referring Provider: Caryl Pina   Encounter Date: 05/02/2017  PT End of Session - 05/02/17 1107    Visit Number  4    Number of Visits  9    Date for PT Re-Evaluation  05/30/17    Authorization Type  Medicare/Medicare Part A and B    Authorization Time Period  04/04/2017 - 05/30/2017    PT Start Time  1117    PT Stop Time  1202    PT Time Calculation (min)  45 min    Equipment Utilized During Treatment  Gait belt    Activity Tolerance  Patient tolerated treatment well;Patient limited by fatigue    Behavior During Therapy  [facility] for tasks assessed/performed       Past Medical History:  Diagnosis Date  . Aortic atherosclerosis (HCC)    Penetrating aortic ulcer s/p endograft 2004 - followed by Dr. Trula Slade  . Arthritis   . COPD (chronic obstructive pulmonary disease) ([place])   . Coronary atherosclerosis of native coronary artery    Mild atherosclerosis at catheterization 2011  . Essential hypertension, benign   . GERD (gastroesophageal reflux disease)   . Gout   . History of kidney stones   . Neuropathy   . Prostate cancer ([place])    XRT  . Sleep apnea    Intolerant of CPAP  . Tubular adenoma of colon 08/2008    Past Surgical History:  Procedure Laterality Date  . Aortic endograft repair  2004   Dr. [name]  . CHEST TUBE INSERTION  1989   "collapsed lung"due to injury  . CIRCUMCISION  09/26/2011   Procedure: CIRCUMCISION ADULT;  Surgeon: [name], MD;  [facility];  Service: Urology;  Laterality: N/A;  . COLONOSCOPY W/ POLYPECTOMY    . COLONOSCOPY WITH PROPOFOL N/A 04/27/2016   Procedure: COLONOSCOPY WITH PROPOFOL;  Surgeon: [name], MD;  [facility];  Service: Endoscopy;  Laterality:  N/A;  1115-moved to 845 per Ginger  . CYSTOSCOPY  09/26/2011   Procedure: CYSTOSCOPY;  Surgeon: [name], MD;  [facility];  Service: Urology;  Laterality: N/A;  . ENDOVASCULAR STENT INSERTION  September 13, 2007   EVAR - Aortic stent graft  . ESOPHAGOGASTRODUODENOSCOPY (EGD) WITH PROPOFOL N/A 04/27/2016   Procedure: ESOPHAGOGASTRODUODENOSCOPY (EGD) WITH PROPOFOL;  Surgeon: [name], MD;  [facility];  Service: Endoscopy;  Laterality: N/A;  . EVAR    . FRACTURE SURGERY     Bilateral lower arms as a child  . KNEE ARTHROSCOPY     Left  . KNEE SURGERY  Sept. 26, 2013   Torn minisc.- Right  knee  . MALONEY DILATION N/A 04/27/2016   Procedure: Venia Minks DILATION;  Surgeon: [name], MD;  [facility];  Service: Endoscopy;  Laterality: N/A;  . PARTIAL KNEE ARTHROPLASTY Right 04/26/2015   Procedure: RIGHT KNEE MEDIAL UNICOMPARTMENTAL ARTHROPLASTY;  Surgeon: Gaynelle Arabian, MD;  [facility];  Service: Orthopedics;  Laterality: Right;  . POLYPECTOMY  04/27/2016   Procedure: POLYPECTOMY;  Surgeon: [name], MD;  [facility];  Service: Endoscopy;;  cecal , ascending, descending, and sigmoid polypectomies  . PROSTATE BIOPSY  09/26/2011   Procedure: BIOPSY TRANSRECTAL ULTRASONIC PROSTATE (TUBP);  Surgeon:  [name], MD;  [facility];  Service: Urology;  Laterality: N/A;     . UMBILICAL HERNIA REPAIR      There were no vitals filed for this visit.  Subjective Assessment - 05/02/17 1109    Subjective  Patient denies falls and reports he feels good overall. he reports he is still fearfull of falling and states his left hip still feels weak although he can tell it has improved.     Pertinent History  history of prostate cancer with steroid treatment, TKA, 5 falls within last year, most recent resulting in right elbow fracture, patient denies any restrictions or precautions     Limitations  Walking;House hold activities;Other (comment)    How  long can you sit comfortably?  unlimited    How long can you stand comfortably?  unlimited    How long can you walk comfortably?  can walk around a room but is afraid of walking farther distances and in the community    Patient Stated Goals  feel safe leaving his home again and not be fearful of falling    Currently in Pain?  No/denies       [facility] PT Assessment - 05/02/17 0001      Assessment   Medical Diagnosis  History of falling, balance disorder    Referring Provider  [name], Vonna Kotyk    Onset Date/Surgical Date  -- 2+ year history of falling    Hand Dominance  Right    Next MD Visit  05/23/17 with neurologist    Prior Therapy  for TKA      Strength   Right Hip Flexion  5/5    Right Hip Extension  5/5    Right Hip ABduction  4+/5    Left Hip Flexion  4+/5    Left Hip Extension  5/5    Left Hip ABduction  4+/5    Right Knee Flexion  5/5    Right Knee Extension  5/5    Left Knee Flexion  5/5    Left Knee Extension  5/5    Right Ankle Dorsiflexion  5/5    Left Ankle Dorsiflexion  5/5      Flexibility   Piriformis  50% limited left, 25% limited right      Ambulation/Gait   Ambulation/Gait  Yes    Ambulation/Gait Assistance  7: Independent    Ambulation Distance (Feet)  694 Feet    Gait Pattern  Step-through pattern;Decreased hip/knee flexion - right;Decreased hip/knee flexion - left;Poor foot clearance - left;Poor foot clearance - right    Ambulation Surface  Level    Gait velocity  1.17 m/s was 1.0 m/s    Stairs  Yes    Stairs Assistance  7: Independent    Stair Management Technique  Alternating pattern;Forwards;One rail Left;One rail Right    Number of Stairs  4    Height of Stairs  6    Gait Comments  Patient able to ascend/descend with forward step over step pattern using 1 hand rail, however required encouragement to perform due to fear of falling. He reports at home he typically uses 2 rails.       Standardized Balance Assessment   Standardized Balance  Assessment  Dynamic Gait Index;Timed Up and Go Test      Dynamic Gait Index   Level Surface  Normal    Change in Gait Speed  Normal    Gait with Horizontal Head Turns  Mild Impairment  Gait with Vertical Head Turns  Mild Impairment    Gait and Pivot Turn  Normal    Step Over Obstacle  Normal    Step Around Obstacles  Normal    Steps  Mild Impairment    Total Score  21    DGI comment:  21/24      Timed Up and Go Test   TUG  Normal TUG    Normal TUG (seconds)  12.2       Balance Exercises - 05/02/17 1155      Balance Exercises: Standing   Tandem Stance  Eyes open;Foam/compliant surface;2 reps;30 secs    Rockerboard  Lateral;UE support    Step Over Hurdles / Cones  4x 6" hurdles in // bars, 3 times each direction for latearl step over        PT Short Term Goals - 05/02/17 1109      PT SHORT TERM GOAL #1   Title  Patient will be independent with HEP particiaption and be able to demosntrate safe and appropriate form/technique wtih all exercises to improve independence with exercise and activity at home.    Baseline  05/02/16 - pateitn reported    Time  2    Period  Weeks    Status  Achieved      PT SHORT TERM GOAL #2   Title  Patient with have 1/2 grade improvement in bilatearl hip abductor strength to improve functional statbility with singel limb stance activitites.    Baseline  05/02/17 - 1/2 forl imited groups    Time  4    Period  Weeks    Status  Achieved      PT SHORT TERM GOAL #3   Title  Patient with ascend/descend 4 6" steps with 1 hand rail and step over step pattern to improve independence with functional mobility.    Baseline  05/02/16 - patient abel to do but reduced confidence with stairs    Time  4    Period  Weeks    Status  Achieved        PT Long Term Goals - 05/02/17 1109      PT LONG TERM GOAL #1   Title  Patient with ascend/descend 12x  6" steps with 1 hand rail and step over step pattern to improve independence with functional mobility.     Baseline  05/02/17 - 4x 6" with 1 hand rail and step over step forward pattern. Patient with decreased confidence with stairs    Time  8    Period  Weeks    Status  On-going      PT LONG TERM GOAL #2   Title  Patient will score a 24/24 on the DGI to demstrate improved balance and no longer be at an incresaed fall risk.    Baseline  05/02/17 - patient scored 21/24 today and met goal of 20/24, goal updated    Time  8    Period  Weeks    Status  Revised      PT LONG TERM GOAL #3   Title  Patient will score equal to or above 80% on ABC scale to demonstrate improved confidence with balance activities and decreased fear of falling for improved QOL.    Time  8    Period  Weeks    Status  On-going      PT LONG TERM GOAL #4   Title  Patient will be able to maintain SLS for 10 seconds bilaterally  to increase confidence with functional mobility and balance during stairs.     Time  4    Period  Weeks    Status  New    Target Date  05/30/17        Plan - 05/02/17 1108    Clinical Impression Statement  Re-Assessment performed today and patient has met 3/3 short term goals and 1/3 long term goals. The long-term goal was revised for patient to demonstrate further improvement with DGI and decrease fall risk during ambulation more. He has also improve gait velocity during 3MWT to 1.17 m/s indicating he is a reduce risk for falling and is a safe community ambulatory. His greatest challenged remains decreased balance during single limb stance activities and decreased confidence/fear of falling. A new goal was added for single limb stance balance. He will continue to benefit from skilled PT services to address ongoing impairments and progress to remaining goals to improve functional strength and reduce fall risk.    Rehab Potential  Fair    Clinical Impairments Affecting Rehab Potential  fear of falling (-), supportive spouse (+), motivated (+)    PT Frequency  1x / week    PT Duration  8 weeks    PT  Treatment/Interventions  ADLs/Self Care Home Management;Gait training;DME Instruction;Stair training;Functional mobility training;Therapeutic activities;Therapeutic exercise;Balance training;Neuromuscular re-education;Patient/family education;Passive range of motion    PT Next Visit Plan  Continue with SLS balance activities and glut med functional strengthening. Continue side stepping with TB and progress dynamic gait activities with fwd/bkwd/lateral stepping on non-compliant/compliant surface.     PT Home Exercise Plan  clamshell RTB, bridges, NBOS and side stepping at counter; 05/02/17 - SLS at counter with foot back for support    Consulted and Agree with Plan of Care  Patient       Patient will benefit from skilled therapeutic intervention in order to improve the following deficits and impairments:  Abnormal gait, Decreased knowledge of use of DME, Impaired sensation, Decreased mobility, Postural dysfunction, Decreased activity tolerance, Decreased endurance, Decreased strength, Impaired perceived functional ability, Decreased balance, Difficulty walking, Obesity  Visit Diagnosis: History of falling  Abnormality of gait due to impairment of balance  Balance problem  Muscle weakness (generalized)     Problem List Patient Active Problem List   Diagnosis Date Noted  . Dysphagia 07/10/2016  . Diarrhea 07/10/2016  . BMI 40.0-44.9, adult ([place]) 05/31/2015  . Neuropathy 03/17/2015  . Prostate cancer ([place]) 06/30/2013  . Abdominal aneurysm without mention of rupture 02/19/2013  . Need for prophylactic vaccination and inoculation against influenza 02/03/2013  . ANXIETY 01/14/2013  . OBESITY NOS 01/14/2013  . VITAMIN B12 DEFICIENCY 01/14/2013  . TOBACCO ABUSE 01/14/2013  . PTSD 01/14/2013  . COPD (chronic obstructive pulmonary disease) ([place])   . OSA (obstructive sleep apnea) 08/14/2012  . RLS (restless legs syndrome) 08/14/2012  . Arthritis of both knees 08/14/2012  . Prediabetes  08/14/2012  . Nephrolithiasis 08/14/2012  . Aortic atherosclerosis ([place]) 02/19/2012  . Coronary atherosclerosis of native coronary artery 03/23/2010  . CHRONIC KIDNEY DISEASE STAGE II (MILD) 10/13/2009  . GERD 07/30/2008  . BENIGN PROSTATIC HYPERTROPHY, WITH OBSTRUCTION 02/20/2008  . Hyperlipidemia 01/31/2007  . GOUT 01/31/2007  . Essential hypertension, benign 01/31/2007    Kipp Brood, PT, DPT Physical Therapist with [facility]  05/02/2017 12:20 PM    [place], [place], [address] Phone: [phone]   Fax:  [phone]  Name:  ROLLAND STEINERT MRN: [id number] Date of Birth: Aug 04, 1945

## 2017-05-02 NOTE — Patient Instructions (Signed)
    SINGLE LEG STANCE - SLS: 3-5 times each foot for 20-30 seconds  Stand on one leg and maintain your balance. Place your fingertips on the counter for support. And place your back foot down for support.

## 2017-05-09 ENCOUNTER — Ambulatory Visit (HOSPITAL_COMMUNITY): Payer: Medicare Other | Attending: Family Medicine

## 2017-05-09 ENCOUNTER — Encounter (HOSPITAL_COMMUNITY): Payer: Self-pay

## 2017-05-09 DIAGNOSIS — R2689 Other abnormalities of gait and mobility: Secondary | ICD-10-CM | POA: Diagnosis not present

## 2017-05-09 DIAGNOSIS — M6281 Muscle weakness (generalized): Secondary | ICD-10-CM

## 2017-05-09 DIAGNOSIS — Z9181 History of falling: Secondary | ICD-10-CM | POA: Diagnosis not present

## 2017-05-09 NOTE — Patient Instructions (Signed)
Marching In-Place    Standing straight, alternate bringing knees toward trunk. Bent arms swing alternately. Do 10x holding for 5". Do 1-2 times per day.  Copyright  VHI. All rights reserved.

## 2017-05-09 NOTE — Therapy (Signed)
[place], [place], [address] Phone: [phone]   Fax:  [phone]  Physical Therapy Treatment  Patient Details  Name: [name] MRN: [id number] Date of Birth: [birth year] Referring Provider: Caryl Pina   Encounter Date: 05/09/2017  PT End of Session - 05/09/17 1125    Visit Number  5    Number of Visits  9    Date for PT Re-Evaluation  05/30/17    Authorization Type  Medicare/Medicare Part A and B    Authorization Time Period  04/04/2017 - 05/30/2017    PT Start Time  1120    PT Stop Time  1200    PT Time Calculation (min)  40 min    Equipment Utilized During Treatment  Gait belt    Activity Tolerance  Patient tolerated treatment well;Patient limited by fatigue;Patient limited by pain Lt hip pain wiht abduction movement    Behavior During Therapy  [facility] for tasks assessed/performed       Past Medical History:  Diagnosis Date  . Aortic atherosclerosis (HCC)    Penetrating aortic ulcer s/p endograft 2004 - followed by Dr. Trula Slade  . Arthritis   . COPD (chronic obstructive pulmonary disease) ([place])   . Coronary atherosclerosis of native coronary artery    Mild atherosclerosis at catheterization 2011  . Essential hypertension, benign   . GERD (gastroesophageal reflux disease)   . Gout   . History of kidney stones   . Neuropathy   . Prostate cancer ([place])    XRT  . Sleep apnea    Intolerant of CPAP  . Tubular adenoma of colon 08/2008    Past Surgical History:  Procedure Laterality Date  . Aortic endograft repair  2004   Dr. [name]  . CHEST TUBE INSERTION  1989   "collapsed lung"due to injury  . CIRCUMCISION  09/26/2011   Procedure: CIRCUMCISION ADULT;  Surgeon: [name], MD;  [facility];  Service: Urology;  Laterality: N/A;  . COLONOSCOPY W/ POLYPECTOMY    . COLONOSCOPY WITH PROPOFOL N/A 04/27/2016   Procedure: COLONOSCOPY WITH PROPOFOL;  Surgeon: [name], MD;  [facility];  Service: Endoscopy;  Laterality: N/A;  1115-moved to 845 per Ginger  . CYSTOSCOPY  09/26/2011   Procedure: CYSTOSCOPY;  Surgeon: [name], MD;  [facility];  Service: Urology;  Laterality: N/A;  . ENDOVASCULAR STENT INSERTION  September 13, 2007   EVAR - Aortic stent graft  . ESOPHAGOGASTRODUODENOSCOPY (EGD) WITH PROPOFOL N/A 04/27/2016   Procedure: ESOPHAGOGASTRODUODENOSCOPY (EGD) WITH PROPOFOL;  Surgeon: [name], MD;  [facility];  Service: Endoscopy;  Laterality: N/A;  . EVAR    . FRACTURE SURGERY     Bilateral lower arms as a child  . KNEE ARTHROSCOPY     Left  . KNEE SURGERY  Sept. 26, 2013   Torn minisc.- Right  knee  . MALONEY DILATION N/A 04/27/2016   Procedure: Venia Minks DILATION;  Surgeon: [name], MD;  [facility];  Service: Endoscopy;  Laterality: N/A;  . PARTIAL KNEE ARTHROPLASTY Right 04/26/2015   Procedure: RIGHT KNEE MEDIAL UNICOMPARTMENTAL ARTHROPLASTY;  Surgeon: Gaynelle Arabian, MD;  [facility];  Service: Orthopedics;  Laterality: Right;  . POLYPECTOMY  04/27/2016   Procedure: POLYPECTOMY;  Surgeon: [name], MD;  [facility];  Service: Endoscopy;;  cecal , ascending, descending, and sigmoid polypectomies  . PROSTATE BIOPSY  09/26/2011  Procedure: BIOPSY TRANSRECTAL ULTRASONIC PROSTATE (TUBP);  Surgeon: [name], MD;  [facility];  Service: Urology;  Laterality: N/A;     . UMBILICAL HERNIA REPAIR      There were no vitals filed for this visit.  Subjective Assessment - 05/09/17 1122    Subjective  Pt stated he continues to be fearful of falling, feels PT is not helping.  Reports compliance wiht HEP daily.  Reports Lt hip is bothering him worse today, pain scale 7/10 stated began with started therapy.      Pertinent History  history of prostate cancer with steroid treatment, TKA, 5 falls within last year, most recent resulting in right elbow fracture, patient denies any restrictions or  precautions     Patient Stated Goals  feel safe leaving his home again and not be fearful of falling    Currently in Pain?  Yes    Pain Score  7     Pain Location  Leg    Pain Orientation  Left    Pain Descriptors / Indicators  Aching;Sore    Pain Type  Acute pain    Pain Onset  1 to 4 weeks ago    Pain Frequency  Constant    Aggravating Factors   Exercises    Pain Relieving Factors  rest    Effect of Pain on Daily Activities  minimal, pushes through it                      [facility] Adult PT Treatment/Exercise - 05/09/17 0001      Knee/Hip Exercises: Standing   Forward Step Up  Both;10 reps;Hand Hold: 1;Step Height: 6"    Stairs  reciprocal pattern 7in step height with 1-2 HHA (2HHA descending)    Rocker Board  2 minutes          Balance Exercises - 05/09/17 1142      Balance Exercises: Standing   Tandem Stance  Eyes open;Foam/compliant surface;2 reps;30 secs    SLS  5 reps    SLS with Vectors  3 reps 3x 5" with 1 HHA    Rockerboard  Lateral;UE support    Step Ups  Forward;6 inch;UE support 1    Sidestepping  2 reps;Theraband GTB; reports hip pain with movement    Step Over Hurdles / Cones  4x 6" and 12' hurdles in // bars, 3 times each direction for latearl step over    Marching Limitations  10x5"          PT Short Term Goals - 05/02/17 1109      PT SHORT TERM GOAL #1   Title  Patient will be independent with HEP particiaption and be able to demosntrate safe and appropriate form/technique wtih all exercises to improve independence with exercise and activity at home.    Baseline  05/02/16 - pateitn reported    Time  2    Period  Weeks    Status  Achieved      PT SHORT TERM GOAL #2   Title  Patient with have 1/2 grade improvement in bilatearl hip abductor strength to improve functional statbility with singel limb stance activitites.    Baseline  05/02/17 - 1/2 forl imited groups    Time  4    Period  Weeks    Status  Achieved      PT SHORT TERM  GOAL #3   Title  Patient with ascend/descend 4 6" steps with 1 hand rail  and step over step pattern to improve independence with functional mobility.    Baseline  05/02/16 - patient abel to do but reduced confidence with stairs    Time  4    Period  Weeks    Status  Achieved        PT Long Term Goals - 05/02/17 1109      PT LONG TERM GOAL #1   Title  Patient with ascend/descend 12x  6" steps with 1 hand rail and step over step pattern to improve independence with functional mobility.    Baseline  05/02/17 - 4x 6" with 1 hand rail and step over step forward pattern. Patient with decreased confidence with stairs    Time  8    Period  Weeks    Status  On-going      PT LONG TERM GOAL #2   Title  Patient will score a 24/24 on the DGI to demstrate improved balance and no longer be at an incresaed fall risk.    Baseline  05/02/17 - patient scored 21/24 today and met goal of 20/24, goal updated    Time  8    Period  Weeks    Status  Revised      PT LONG TERM GOAL #3   Title  Patient will score equal to or above 80% on ABC scale to demonstrate improved confidence with balance activities and decreased fear of falling for improved QOL.    Time  8    Period  Weeks    Status  On-going      PT LONG TERM GOAL #4   Title  Patient will be able to maintain SLS for 10 seconds bilaterally to increase confidence with functional mobility and balance during stairs.     Time  4    Period  Weeks    Status  New    Target Date  05/30/17            Plan - 05/09/17 1205    Clinical Impression Statement  Pt entered dept with reports of increased Lt hip pain and is concerned that it may be related to cancer or specific exercise completing during session.  Pt advised to inform MD and pain was monitored through session, noted most c/o pain with abduction movements.  Session focus on balance training and improving confidence to reduce fear of falling.  Pt continues to have difficutly with single leg stance  activities.  Reviewed HEP compliance with additional marching given as pt c/o catching toes ascending stairs.  Included stair training for functional strengthening this session.      Rehab Potential  Fair    Clinical Impairments Affecting Rehab Potential  fear of falling (-), supportive spouse (+), motivated (+)    PT Frequency  1x / week    PT Duration  8 weeks    PT Treatment/Interventions  ADLs/Self Care Home Management;Gait training;DME Instruction;Stair training;Functional mobility training;Therapeutic activities;Therapeutic exercise;Balance training;Neuromuscular re-education;Patient/family education;Passive range of motion    PT Next Visit Plan  Continue with SLS balance activities and glut med functional strengthening. Continue side stepping with TB and progress dynamic gait activities with fwd/bkwd/lateral stepping on non-compliant/compliant surface.     PT Home Exercise Plan  clamshell RTB, bridges, NBOS and side stepping at counter; 05/02/17 - SLS at counter with foot back for support; 05/09/17: marching       Patient will benefit from skilled therapeutic intervention in order to improve the following deficits and impairments:  Abnormal  gait, Decreased knowledge of use of DME, Impaired sensation, Decreased mobility, Postural dysfunction, Decreased activity tolerance, Decreased endurance, Decreased strength, Impaired perceived functional ability, Decreased balance, Difficulty walking, Obesity  Visit Diagnosis: History of falling  Abnormality of gait due to impairment of balance  Balance problem  Muscle weakness (generalized)     Problem List Patient Active Problem List   Diagnosis Date Noted  . Dysphagia 07/10/2016  . Diarrhea 07/10/2016  . BMI 40.0-44.9, adult ([place]) 05/31/2015  . Neuropathy 03/17/2015  . Prostate cancer ([place]) 06/30/2013  . Abdominal aneurysm without mention of rupture 02/19/2013  . Need for prophylactic vaccination and inoculation against influenza  02/03/2013  . ANXIETY 01/14/2013  . OBESITY NOS 01/14/2013  . VITAMIN B12 DEFICIENCY 01/14/2013  . TOBACCO ABUSE 01/14/2013  . PTSD 01/14/2013  . COPD (chronic obstructive pulmonary disease) ([place])   . OSA (obstructive sleep apnea) 08/14/2012  . RLS (restless legs syndrome) 08/14/2012  . Arthritis of both knees 08/14/2012  . Prediabetes 08/14/2012  . Nephrolithiasis 08/14/2012  . Aortic atherosclerosis ([employer]) 02/19/2012  . Coronary atherosclerosis of native coronary artery 03/23/2010  . CHRONIC KIDNEY DISEASE STAGE II (MILD) 10/13/2009  . GERD 07/30/2008  . BENIGN PROSTATIC HYPERTROPHY, WITH OBSTRUCTION 02/20/2008  . Hyperlipidemia 01/31/2007  . GOUT 01/31/2007  . Essential hypertension, benign 01/31/2007   Ihor Austin, [place]; [place]  Aldona Lento 05/09/2017, 12:11 PM  [place], [place], [address] Phone: [phone]   Fax:  [phone]  Name: ERIKSON DANZY MRN: [id number] Date of Birth: December 16, 1945

## 2017-05-14 ENCOUNTER — Other Ambulatory Visit: Payer: Self-pay | Admitting: Family Medicine

## 2017-05-14 DIAGNOSIS — I10 Essential (primary) hypertension: Secondary | ICD-10-CM

## 2017-05-16 ENCOUNTER — Ambulatory Visit (HOSPITAL_COMMUNITY): Payer: Medicare Other

## 2017-05-16 ENCOUNTER — Encounter (HOSPITAL_COMMUNITY): Payer: Self-pay

## 2017-05-16 DIAGNOSIS — M6281 Muscle weakness (generalized): Secondary | ICD-10-CM

## 2017-05-16 DIAGNOSIS — R2689 Other abnormalities of gait and mobility: Secondary | ICD-10-CM | POA: Diagnosis not present

## 2017-05-16 DIAGNOSIS — Z9181 History of falling: Secondary | ICD-10-CM | POA: Diagnosis not present

## 2017-05-16 NOTE — Therapy (Signed)
[place], [place], [address] Phone: [phone]   Fax:  [phone]  Physical Therapy Treatment  Patient Details  Name: [name] MRN: [id number] Date of Birth: 07-21-1945 Referring Provider: Caryl Pina   Encounter Date: 05/16/2017  PT End of Session - 05/16/17 1115    Visit Number  6    Number of Visits  9    Date for PT Re-Evaluation  05/30/17    Authorization Type  Medicare/Medicare Part A and B    Authorization Time Period  04/04/2017 - 05/30/2017    PT Start Time  1112    PT Stop Time  1156    PT Time Calculation (min)  44 min    Equipment Utilized During Treatment  Gait belt    Activity Tolerance  Patient tolerated treatment well;Patient limited by fatigue;Patient limited by pain Lt elbow and knee pain    Behavior During Therapy  [facility] for tasks assessed/performed       Past Medical History:  Diagnosis Date  . Aortic atherosclerosis (HCC)    Penetrating aortic ulcer s/p endograft 2004 - followed by Dr. Trula Slade  . Arthritis   . COPD (chronic obstructive pulmonary disease) ([place])   . Coronary atherosclerosis of native coronary artery    Mild atherosclerosis at catheterization 2011  . Essential hypertension, benign   . GERD (gastroesophageal reflux disease)   . Gout   . History of kidney stones   . Neuropathy   . Prostate cancer ([place])    XRT  . Sleep apnea    Intolerant of CPAP  . Tubular adenoma of colon 08/2008    Past Surgical History:  Procedure Laterality Date  . Aortic endograft repair  2004   Dr. [name]  . CHEST TUBE INSERTION  1989   "collapsed lung"due to injury  . CIRCUMCISION  09/26/2011   Procedure: CIRCUMCISION ADULT;  Surgeon: [name], MD;  [facility];  Service: Urology;  Laterality: N/A;  . COLONOSCOPY W/ POLYPECTOMY    . COLONOSCOPY WITH PROPOFOL N/A 04/27/2016   Procedure: COLONOSCOPY WITH PROPOFOL;  Surgeon: [name], MD;  [facility];   Service: Endoscopy;  Laterality: N/A;  1115-moved to 845 per Ginger  . CYSTOSCOPY  09/26/2011   Procedure: CYSTOSCOPY;  Surgeon: [name], MD;  [facility];  Service: Urology;  Laterality: N/A;  . ENDOVASCULAR STENT INSERTION  September 13, 2007   EVAR - Aortic stent graft  . ESOPHAGOGASTRODUODENOSCOPY (EGD) WITH PROPOFOL N/A 04/27/2016   Procedure: ESOPHAGOGASTRODUODENOSCOPY (EGD) WITH PROPOFOL;  Surgeon: [name], MD;  [facility];  Service: Endoscopy;  Laterality: N/A;  . EVAR    . FRACTURE SURGERY     Bilateral lower arms as a child  . KNEE ARTHROSCOPY     Left  . KNEE SURGERY  Sept. 26, 2013   Torn minisc.- Right  knee  . MALONEY DILATION N/A 04/27/2016   Procedure: Venia Minks DILATION;  Surgeon: [name], MD;  [facility];  Service: Endoscopy;  Laterality: N/A;  . PARTIAL KNEE ARTHROPLASTY Right 04/26/2015   Procedure: RIGHT KNEE MEDIAL UNICOMPARTMENTAL ARTHROPLASTY;  Surgeon: Gaynelle Arabian, MD;  [facility];  Service: Orthopedics;  Laterality: Right;  . POLYPECTOMY  04/27/2016   Procedure: POLYPECTOMY;  Surgeon: [name], MD;  [facility];  Service: Endoscopy;;  cecal , ascending, descending, and sigmoid polypectomies  . PROSTATE BIOPSY  09/26/2011  Procedure: BIOPSY TRANSRECTAL ULTRASONIC PROSTATE (TUBP);  Surgeon: [name], MD;  [facility];  Service: Urology;  Laterality: N/A;     . UMBILICAL HERNIA REPAIR      There were no vitals filed for this visit.  Subjective Assessment - 05/16/17 1112    Subjective  Pt stated he dreamed he was playing baseball and rolled out of bed 2 nights ago, increased soreness Lt knee and elbow, pain scale 7/10.    Pertinent History  history of prostate cancer with steroid treatment, TKA, 5 falls within last year, most recent resulting in right elbow fracture, patient denies any restrictions or precautions     Patient Stated Goals  feel safe leaving his home again and not be fearful of  falling    Currently in Pain?  Yes    Pain Score  7     Pain Location  Knee Lt elbow and knee    Pain Orientation  Left    Pain Descriptors / Indicators  Sore    Pain Type  Acute pain    Pain Onset  1 to 4 weeks ago    Pain Frequency  Constant    Aggravating Factors   Exercise    Pain Relieving Factors  rest    Effect of Pain on Daily Activities  minimal, pushes through it                           Balance Exercises - 05/16/17 1115      Balance Exercises: Standing   Tandem Stance  Eyes open;Foam/compliant surface;3 reps;30 secs 2x 30"; 3rd set with punch out with dowel rod    SLS  5 reps cone tapping 5x each; Lt 11", Rt 4" max    Balance Beam  2RT tandem on balance beam    Tandem Gait  Forward;1 rep    Sidestepping  2 reps;Theraband pain Lt hip with abduction    Step Over Hurdles / Cones  6in hurdles alternating with minimal HHA in // bars 4RT    Marching Limitations  10x5" 1 finger assistance required    Other Standing Exercises  1 foot on 12in step with palvo 12x each side          PT Short Term Goals - 05/02/17 1109      PT SHORT TERM GOAL #1   Title  Patient will be independent with HEP particiaption and be able to demosntrate safe and appropriate form/technique wtih all exercises to improve independence with exercise and activity at home.    Baseline  05/02/16 - pateitn reported    Time  2    Period  Weeks    Status  Achieved      PT SHORT TERM GOAL #2   Title  Patient with have 1/2 grade improvement in bilatearl hip abductor strength to improve functional statbility with singel limb stance activitites.    Baseline  05/02/17 - 1/2 forl imited groups    Time  4    Period  Weeks    Status  Achieved      PT SHORT TERM GOAL #3   Title  Patient with ascend/descend 4 6" steps with 1 hand rail and step over step pattern to improve independence with functional mobility.    Baseline  05/02/16 - patient abel to do but reduced confidence with stairs     Time  4    Period  Weeks    Status  Achieved        PT Long Term Goals - 05/02/17 1109      PT LONG TERM GOAL #1   Title  Patient with ascend/descend 12x  6" steps with 1 hand rail and step over step pattern to improve independence with functional mobility.    Baseline  05/02/17 - 4x 6" with 1 hand rail and step over step forward pattern. Patient with decreased confidence with stairs    Time  8    Period  Weeks    Status  On-going      PT LONG TERM GOAL #2   Title  Patient will score a 24/24 on the DGI to demstrate improved balance and no longer be at an incresaed fall risk.    Baseline  05/02/17 - patient scored 21/24 today and met goal of 20/24, goal updated    Time  8    Period  Weeks    Status  Revised      PT LONG TERM GOAL #3   Title  Patient will score equal to or above 80% on ABC scale to demonstrate improved confidence with balance activities and decreased fear of falling for improved QOL.    Time  8    Period  Weeks    Status  On-going      PT LONG TERM GOAL #4   Title  Patient will be able to maintain SLS for 10 seconds bilaterally to increase confidence with functional mobility and balance during stairs.     Time  4    Period  Weeks    Status  New    Target Date  05/30/17            Plan - 05/16/17 1158    Clinical Impression Statement  Pt reports he rolled out of bed 2 nights ago, entered session with increased knee and elbow pain from impact.  Session focus with balance training with increased focus on SLS activities.  Pt improving SLS with ability to SLS Lt LE 11" without HHA.  Included dynamic NBOS gait as well with minimal difficulty with new task.  Pt does continue to have difficulty with single leg stance activities, continue to focus next session.      Rehab Potential  Fair    Clinical Impairments Affecting Rehab Potential  fear of falling (-), supportive spouse (+), motivated (+)    PT Frequency  1x / week    PT Duration  8 weeks    PT  Treatment/Interventions  ADLs/Self Care Home Management;Gait training;DME Instruction;Stair training;Functional mobility training;Therapeutic activities;Therapeutic exercise;Balance training;Neuromuscular re-education;Patient/family education;Passive range of motion    PT Next Visit Plan  Continue with SLS balance activities and glut med functional strengthening. Continue side stepping with TB and progress dynamic gait activities with fwd/bkwd/lateral stepping on non-compliant/compliant surface.     PT Home Exercise Plan  clamshell RTB, bridges, NBOS and side stepping at counter; 05/02/17 - SLS at counter with foot back for support; 05/09/17: marching       Patient will benefit from skilled therapeutic intervention in order to improve the following deficits and impairments:  Abnormal gait, Decreased knowledge of use of DME, Impaired sensation, Decreased mobility, Postural dysfunction, Decreased activity tolerance, Decreased endurance, Decreased strength, Impaired perceived functional ability, Decreased balance, Difficulty walking, Obesity  Visit Diagnosis: History of falling  Abnormality of gait due to impairment of balance  Balance problem  Muscle weakness (generalized)     Problem List Patient Active Problem List  Diagnosis Date Noted  . Dysphagia 07/10/2016  . Diarrhea 07/10/2016  . BMI 40.0-44.9, adult ([place]) 05/31/2015  . Neuropathy 03/17/2015  . Prostate cancer ([place]) 06/30/2013  . Abdominal aneurysm without mention of rupture 02/19/2013  . Need for prophylactic vaccination and inoculation against influenza 02/03/2013  . ANXIETY 01/14/2013  . OBESITY NOS 01/14/2013  . VITAMIN B12 DEFICIENCY 01/14/2013  . TOBACCO ABUSE 01/14/2013  . PTSD 01/14/2013  . COPD (chronic obstructive pulmonary disease) ([place])   . OSA (obstructive sleep apnea) 08/14/2012  . RLS (restless legs syndrome) 08/14/2012  . Arthritis of both knees 08/14/2012  . Prediabetes 08/14/2012  . Nephrolithiasis  08/14/2012  . Aortic atherosclerosis ([place]) 02/19/2012  . Coronary atherosclerosis of native coronary artery 03/23/2010  . CHRONIC KIDNEY DISEASE STAGE II (MILD) 10/13/2009  . GERD 07/30/2008  . BENIGN PROSTATIC HYPERTROPHY, WITH OBSTRUCTION 02/20/2008  . Hyperlipidemia 01/31/2007  . GOUT 01/31/2007  . Essential hypertension, benign 01/31/2007   Ihor Austin, [place]; [place]  Aldona Lento 05/16/2017, 12:03 PM  [place] [address] [place], [place], [address] Phone: [phone]   Fax:  [phone]  Name: [name] MRN: [id number] Date of Birth: 06-26-1945

## 2017-05-22 DIAGNOSIS — S52134D Nondisplaced fracture of neck of right radius, subsequent encounter for closed fracture with routine healing: Secondary | ICD-10-CM | POA: Diagnosis not present

## 2017-05-23 ENCOUNTER — Encounter (HOSPITAL_COMMUNITY): Payer: Medicare Other

## 2017-05-23 DIAGNOSIS — M130 Polyarthritis, unspecified: Secondary | ICD-10-CM | POA: Diagnosis not present

## 2017-05-23 DIAGNOSIS — G4733 Obstructive sleep apnea (adult) (pediatric): Secondary | ICD-10-CM | POA: Diagnosis not present

## 2017-05-23 DIAGNOSIS — R2689 Other abnormalities of gait and mobility: Secondary | ICD-10-CM | POA: Diagnosis not present

## 2017-05-23 DIAGNOSIS — I10 Essential (primary) hypertension: Secondary | ICD-10-CM | POA: Diagnosis not present

## 2017-05-28 ENCOUNTER — Ambulatory Visit (INDEPENDENT_AMBULATORY_CARE_PROVIDER_SITE_OTHER): Payer: Medicare Other | Admitting: Family

## 2017-05-28 ENCOUNTER — Encounter: Payer: Self-pay | Admitting: Family

## 2017-05-28 VITALS — BP 143/79 | HR 79 | Temp 97.2°F | Ht 68.0 in | Wt 283.0 lb

## 2017-05-28 DIAGNOSIS — S43491A Other sprain of right shoulder joint, initial encounter: Secondary | ICD-10-CM | POA: Diagnosis not present

## 2017-05-28 MED ORDER — PREDNISONE 10 MG (21) PO TBPK
ORAL_TABLET | ORAL | 0 refills | Status: DC
Start: 2017-05-28 — End: 2017-07-20

## 2017-05-28 NOTE — Progress Notes (Signed)
   Subjective:    Patient ID: LA DIBELLA, male    DOB: 1945/05/12, 72 y.o.   MRN: [id number]  HPI PT presents to the office today with right shoulder/chest pain that started two days ago after he was in bed and was rolling over and "felt" a pop.  Pt states he is having intermittent aching pain of 7 out 10 when he moves his arm up or coughs. States he does not have any pain if he is laying still.    Review of Systems  Musculoskeletal: Positive for arthralgias and back pain.       Right shoulder pain  All other systems reviewed and are negative.      Objective:   Physical Exam  Constitutional: He is oriented to person, place, and time. He appears well-developed and well-nourished. No distress.  HENT:  Head: Normocephalic.  Eyes: Pupils are equal, round, and reactive to light. Right eye exhibits no discharge. Left eye exhibits no discharge.  Neck: Normal range of motion. Neck supple. No thyromegaly present.  Cardiovascular: Normal rate, regular rhythm, normal heart sounds and intact distal pulses.  No murmur heard. Pulmonary/Chest: Effort normal and breath sounds normal. No respiratory distress. He has no wheezes.  Abdominal: Soft. Bowel sounds are normal. He exhibits no distension. There is no tenderness.  Musculoskeletal: He exhibits edema. He exhibits no tenderness.  Full ROM of right shoulder, pain with abduction and extension   Neurological: He is alert and oriented to person, place, and time.  Skin: Skin is warm and dry. No rash noted. No erythema.  Psychiatric: He has a normal mood and affect. His behavior is normal. Judgment and thought content normal.  Vitals reviewed.     BP (!) 143/79   Pulse 79   Temp (!) 97.2 F (36.2 C) (Oral)   Ht 5\' 8"  (1.727 m)   Wt 283 lb (128.4 kg)   BMI 43.03 kg/m      Assessment & Plan:  1. Sprain of other part of right shoulder region, initial encounter Rest Ice  ROM exercises Will do prednisone Last A1C at at goal, but  needs to follow up with PCP for lab work. Creatinine elevated and borderline diabetic.  Low carb diet RTO prn  - predniSONE (STERAPRED UNI-PAK 21 TAB) 10 MG (21) TBPK tablet; Use as directed  Dispense: 21 tablet; Refill: 0    Evelina Dun, FNP

## 2017-05-28 NOTE — Patient Instructions (Signed)
Shoulder Sprain A shoulder sprain is a partial or complete tear in one of the tough, fiber-like tissues (ligaments) in the shoulder. The ligaments in the shoulder help to hold the shoulder in place. What are the causes? This condition may be caused by:  A fall.  A hit to the shoulder.  A twist of the arm.  What increases the risk? This condition is more likely to develop in:  People who play sports.  People who have problems with balance or coordination.  What are the signs or symptoms? Symptoms of this condition include:  Pain when moving the shoulder.  Limited ability to move the shoulder.  Swelling and tenderness on top of the shoulder.  Warmth in the shoulder.  A change in the shape of the shoulder.  Redness or bruising on the shoulder.  How is this diagnosed? This condition is diagnosed with a physical exam. During the exam, you may be asked to do simple exercises with your shoulder. You may also have imaging tests, such as X-rays, MRI, or a CT scan. These tests can show how severe the sprain is. How is this treated? This condition may be treated with:  Rest.  Pain medicine.  Ice.  A sling or brace. This is used to keep the arm still while the shoulder is healing.  Physical therapy or rehabilitation exercises. These help to improve the range of motion and strength of the shoulder.  Surgery (rare). Surgery may be needed if the sprain caused a joint to become unstable. Surgery may also be needed to reduce pain.  Some people may develop ongoing shoulder pain or lose some range of motion in the shoulder. However, most people do not develop long-term problems. Follow these instructions at home:  Rest.  Ask your health care provider when it is safe for you to drive if you have a sling or brace on your shoulder.  Take over-the-counter and prescription medicines only as told by your health care provider.  If directed, apply ice to the area: ? Put ice in a  plastic bag. ? Place a towel between your skin and the bag. ? Leave the ice on for 20 minutes, 2-3 times per day.  If you were given a shoulder sling or brace: ? Wear it as told. ? Remove it to shower or bathe. ? Move your arm only as much as told by your health care provider, but keep your hand moving to prevent swelling.  If you were shown how to do any exercises, do them as told by your health care provider.  Keep all follow-up visits as told by your health care provider. This is important. Contact a health care provider if:  Your pain gets worse.  Your pain is not relieved with medicines.  You have increased redness or swelling. Get help right away if:  You have a fever.  You cannot move your arm or shoulder.  You develop severe numbness or tingling in your arm, hand, or fingers.  Your arm, hand, or fingers turn blue, white, or gray and feel cold. This information is not intended to replace advice given to you by your health care provider. Make sure you discuss any questions you have with your health care provider. Document Released: 08/06/2008 Document Revised: 11/14/2015 Document Reviewed: 07/13/2014 Elsevier Interactive Patient Education  2018 [employer]  

## 2017-05-30 ENCOUNTER — Ambulatory Visit (HOSPITAL_COMMUNITY): Payer: Medicare Other

## 2017-05-30 DIAGNOSIS — Z9181 History of falling: Secondary | ICD-10-CM

## 2017-05-30 DIAGNOSIS — R2689 Other abnormalities of gait and mobility: Secondary | ICD-10-CM | POA: Diagnosis not present

## 2017-05-30 DIAGNOSIS — M6281 Muscle weakness (generalized): Secondary | ICD-10-CM

## 2017-05-30 NOTE — Therapy (Signed)
[place], [place], [address] Phone: [phone]   Fax:  [phone]  Physical Therapy Treatment/Discharge Summary  Patient Details  Name: [name] MRN: [id number] Date of Birth: 11-23-1945 Referring Provider: [name], Vonna Kotyk   Encounter Date: 05/30/2017  PHYSICAL THERAPY DISCHARGE SUMMARY  Visits from Start of Care: 7  Current functional level related to goals / functional outcomes: Re-assessment performed today and patient has met 3/3 short term goals and partially met 2/4 long term goals. He has made significant improvement in strength, balance, and had 9% improvement in FOTO indicating decreased limitation. He made a 20% improvement in his confidence level based on the ABC scale however his confidence and fear of fall remains his greatest challenge. [name] reports feeling he has made a good improvement overall and feels he can continue progressing with the exercises provided to maintain his current functional level. Based on his satisfaction with function and decreased risk of falling based on testing he will be discharged after today's session.   Remaining deficits: See below details   Education / Equipment: Educated on progress towards goals and instructed on importance of conitnuation of HEP to maintain progress.    Plan: Patient agrees to discharge.  Patient goals were partially met. Patient is being discharged due to being pleased with the current functional level.  ?????       PT End of Session - 05/30/17 1300    Visit Number  7    Number of Visits  9    Date for PT Re-Evaluation  05/30/17    Authorization Type  Medicare/Medicare Part A and B    Authorization Time Period  04/04/2017 - 05/30/2017    PT Start Time  1302    PT Stop Time  1334    PT Time Calculation (min)  32 min    Equipment Utilized During Treatment  Gait belt    Activity Tolerance  Patient tolerated treatment well Lt  elbow and knee pain    Behavior During Therapy  WFL for tasks assessed/performed       Past Medical History:  Diagnosis Date  . Aortic atherosclerosis (HCC)    Penetrating aortic ulcer s/p endograft 2004 - followed by Dr. Trula Slade  . Arthritis   . COPD (chronic obstructive pulmonary disease) ([place])   . Coronary atherosclerosis of native coronary artery    Mild atherosclerosis at catheterization 2011  . Essential hypertension, benign   . GERD (gastroesophageal reflux disease)   . Gout   . History of kidney stones   . Neuropathy   . Prostate cancer ([facility])    XRT  . Sleep apnea    Intolerant of CPAP  . Tubular adenoma of colon 08/2008    Past Surgical History:  Procedure Laterality Date  . Aortic endograft repair  2004   Dr. [name]  . CHEST TUBE INSERTION  1989   "collapsed lung"due to injury  . CIRCUMCISION  09/26/2011   Procedure: CIRCUMCISION ADULT;  Surgeon: [name], MD;  Location: [facility];  Service: Urology;  Laterality: N/A;  . COLONOSCOPY W/ POLYPECTOMY    . COLONOSCOPY WITH PROPOFOL N/A 04/27/2016   Procedure: COLONOSCOPY WITH PROPOFOL;  Surgeon: [name], MD;  [facility];  Service: Endoscopy;  Laterality: N/A;  1115-moved to 845 per Ginger  . CYSTOSCOPY  09/26/2011   Procedure: CYSTOSCOPY;  Surgeon: [name], MD;  Location: [facility];  Service: Urology;  Laterality: N/A;  .  ENDOVASCULAR STENT INSERTION  September 13, 2007   EVAR - Aortic stent graft  . ESOPHAGOGASTRODUODENOSCOPY (EGD) WITH PROPOFOL N/A 04/27/2016   Procedure: ESOPHAGOGASTRODUODENOSCOPY (EGD) WITH PROPOFOL;  Surgeon: [name], MD;  [facility];  Service: Endoscopy;  Laterality: N/A;  . EVAR    . FRACTURE SURGERY     Bilateral lower arms as a child  . KNEE ARTHROSCOPY     Left  . KNEE SURGERY  Sept. 26, 2013   Torn minisc.- Right  knee  . MALONEY DILATION N/A 04/27/2016   Procedure: Venia Minks DILATION;  Surgeon: [name], MD;  [facility];  Service:  Endoscopy;  Laterality: N/A;  . PARTIAL KNEE ARTHROPLASTY Right 04/26/2015   Procedure: RIGHT KNEE MEDIAL UNICOMPARTMENTAL ARTHROPLASTY;  Surgeon: Gaynelle Arabian, MD;  Location: [facility];  Service: Orthopedics;  Laterality: Right;  . POLYPECTOMY  04/27/2016   Procedure: POLYPECTOMY;  Surgeon: [name], MD;  [facility];  Service: Endoscopy;;  cecal , ascending, descending, and sigmoid polypectomies  . PROSTATE BIOPSY  09/26/2011   Procedure: BIOPSY TRANSRECTAL ULTRASONIC PROSTATE (TUBP);  Surgeon: [name], MD;  Location: [facility];  Service: Urology;  Laterality: N/A;     . UMBILICAL HERNIA REPAIR      There were no vitals filed for this visit.  Subjective Assessment - 05/30/17 1301    Subjective   He states he is still sore form hurting his right shoulder when he fell off his bed but reports the MD checked him and said it is a sprain and put him on prednisone for pain. He states he went to the neurologist and they recommended he walk with a cane for balance. Patient states he needs to be in [place] after his appointment and needs to make this quick because he is going to help someone with work.    Pertinent History  history of prostate cancer with steroid treatment, TKA, 5 falls within last year, most recent resulting in right elbow fracture, patient denies any restrictions or precautions     Limitations  Walking;House hold activities;Other (comment)    How long can you sit comfortably?  unlimited    How long can you stand comfortably?  unlimited    Patient Stated Goals  feel safe leaving his home again and not be fearful of falling    Currently in Pain?  Yes    Pain Score  7     Pain Location  Shoulder    Pain Orientation  Right    Pain Descriptors / Indicators  Aching    Pain Type  Acute pain    Pain Onset  In the past 7 days    Pain Frequency  Constant         [facility] PT Assessment - 05/30/17 0001      Assessment   Medical Diagnosis  History of falling, balance  disorder    Referring Provider  [name], Vonna Kotyk    Hand Dominance  Right    Next MD Visit  05/23/17    Prior Therapy  for TKA      Observation/Other Assessments   Focus on Therapeutic Outcomes (FOTO)   45% limited was 54% limited    Other Surveys   Other Surveys    Activities of Balance Confidence Scale (ABC Scale)   65.625 was 46.875      Strength   Right Hip Flexion  5/5    Right Hip Extension  -- was 5/5, unable  to test due to right shoulder pain    Right Hip ABduction  5/5    Left Hip Flexion  5/5    Left Hip Extension  -- was 5/5, unable to test due to right shoulder pain    Left Hip ABduction  -- was 4+/5, unable to test due to right shoulder pain    Right Knee Flexion  -- was 5/5    Right Knee Extension  -- was 5/5    Left Knee Flexion  -- was 5/5    Left Knee Extension  -- was 5/5    Right Ankle Dorsiflexion  -- was 5/5    Left Ankle Dorsiflexion  -- was 5/5      Flexibility   Piriformis  50% limited left, 25% limited right      Ambulation/Gait   Stairs Assistance  7: Independent    Stair Management Technique  Alternating pattern;Step to pattern;No rails alternating to ascend, step to to descend    Number of Stairs  12    Height of Stairs  6    Gait Comments  Patient reports he feels more confident overall with stairs but still uses 1 railing to feel safe. He states he thinks about picking his feet up more to avoid tripping on the edge of each step      Dynamic Gait Index   Level Surface  Normal    Change in Gait Speed  Normal    Gait with Horizontal Head Turns  Normal    Gait with Vertical Head Turns  Normal    Gait and Pivot Turn  Normal    Step Over Obstacle  Normal    Step Around Obstacles  Normal    Steps  Mild Impairment    Total Score  23    DGI comment:  23/24      Timed Up and Go Test   TUG  --         PT Education - 05/30/17 1301    Education provided  Yes    Education Details  Educated on progress towards goals and instructed on importance  of conitnuation of HEP to maintain progress.    Person(s) Educated  Patient    Methods  Explanation    Comprehension  Verbalized understanding       PT Short Term Goals - 05/30/17 1301      PT SHORT TERM GOAL #1   Title  Patient will be independent with HEP particiaption and be able to demosntrate safe and appropriate form/technique wtih all exercises to improve independence with exercise and activity at home.    Baseline  05/02/16 - pateitn reported    Time  2    Period  Weeks    Status  Achieved      PT SHORT TERM GOAL #2   Title  Patient with have 1/2 grade improvement in bilatearl hip abductor strength to improve functional statbility with singel limb stance activitites.    Baseline  05/02/17 - 1/2 forl imited groups    Time  4    Period  Weeks    Status  Achieved      PT SHORT TERM GOAL #3   Title  Patient with ascend/descend 4 6" steps with 1 hand rail and step over step pattern to improve independence with functional mobility.    Baseline  05/02/16 - patient abel to do but reduced confidence with stairs    Time  4    Period  Weeks  Status  Achieved        PT Long Term Goals - 05/30/17 1301      PT LONG TERM GOAL #1   Title  Patient with ascend/descend 12x  6" steps with 1 hand rail and step over step pattern to improve independence with functional mobility.    Baseline  05/30/17 - 12 x 6" with step over step forward pattern to ascend no hand rail, and step to pattern to descend with no hand rail    Time  8    Period  Weeks    Status  Partially Met      PT LONG TERM GOAL #2   Title  Patient will score a 24/24 on the DGI to demstrate improved balance and no longer be at an incresaed fall risk.    Baseline  05/02/17 - patient scored 21/24 today and met goal of 20/24, goal updated - 05/30/17 - 23/24, limited by stairs    Time  8    Period  Weeks    Status  Partially Met      PT LONG TERM GOAL #3   Title  Patient will score equal to or above 80% on ABC scale to  demonstrate improved confidence with balance activities and decreased fear of falling for improved QOL.    Baseline  05/30/17 - 65.625     Time  8    Period  Weeks    Status  Not Met      PT LONG TERM GOAL #4   Title  Patient will be able to maintain SLS for 10 seconds bilaterally to increase confidence with functional mobility and balance during stairs.     Baseline  05/30/08 - Rt LE 6 secodns, Lt LE 9 seconds    Time  4    Period  Weeks    Status  Not Met        Plan - 05/30/17 1300    Clinical Impression Statement   Re-assessment performed today and patient has met 3/3 short term goals and partially met 2/4 long term goals. He has made significant improvement in strength, balance, and had 9% improvement in FOTO indicating decreased limitation. He made a 20% improvement in his confidence level based on the ABC scale however his confidence and fear of fall remains his greatest challenge. [name] reports feeling he has made a good improvement overall and feels he can continue progressing with the exercises provided to maintain his current functional level. Based on his satisfaction with function and decreased risk of falling based on testing he will be discharged after today's session.     Rehab Potential  Fair    Clinical Impairments Affecting Rehab Potential  fear of falling (-), supportive spouse (+), motivated (+)    PT Frequency  1x / week    PT Duration  8 weeks    PT Treatment/Interventions  ADLs/Self Care Home Management;Gait training;DME Instruction;Stair training;Functional mobility training;Therapeutic activities;Therapeutic exercise;Balance training;Neuromuscular re-education;Patient/family education;Passive range of motion    PT Next Visit Plan  Discharging this session    PT Home Exercise Plan  clamshell RTB, bridges, NBOS and side stepping at counter; 05/02/17 - SLS at counter with foot back for support; 05/09/17: marching    Consulted and Agree with Plan of Care  Patient        Patient will benefit from skilled therapeutic intervention in order to improve the following deficits and impairments:  Abnormal gait, Decreased knowledge of use of DME, Impaired sensation,  Decreased mobility, Postural dysfunction, Decreased activity tolerance, Decreased endurance, Decreased strength, Impaired perceived functional ability, Decreased balance, Difficulty walking, Obesity  Visit Diagnosis: History of falling  Abnormality of gait due to impairment of balance  Balance problem  Muscle weakness (generalized)     Problem List Patient Active Problem List   Diagnosis Date Noted  . Dysphagia 07/10/2016  . Diarrhea 07/10/2016  . BMI 40.0-44.9, adult ([facility]) 05/31/2015  . Neuropathy 03/17/2015  . Prostate cancer ([facility]) 06/30/2013  . Abdominal aneurysm without mention of rupture 02/19/2013  . Need for prophylactic vaccination and inoculation against influenza 02/03/2013  . ANXIETY 01/14/2013  . OBESITY NOS 01/14/2013  . VITAMIN B12 DEFICIENCY 01/14/2013  . TOBACCO ABUSE 01/14/2013  . PTSD 01/14/2013  . COPD (chronic obstructive pulmonary disease) ([facility])   . OSA (obstructive sleep apnea) 08/14/2012  . RLS (restless legs syndrome) 08/14/2012  . Arthritis of both knees 08/14/2012  . Prediabetes 08/14/2012  . Nephrolithiasis 08/14/2012  . Aortic atherosclerosis ([facility] [place]) 02/19/2012  . Coronary atherosclerosis of native coronary artery 03/23/2010  . CHRONIC KIDNEY DISEASE STAGE II ([facility]) 10/13/2009  . GERD 07/30/2008  . BENIGN PROSTATIC HYPERTROPHY, WITH OBSTRUCTION 02/20/2008  . Hyperlipidemia 01/31/2007  . GOUT 01/31/2007  . Essential hypertension, benign 01/31/2007    Kipp Brood, PT, DPT Physical Therapist with [facility]  05/30/2017 2:09 PM    [place], [place], [address] Phone: [phone]   Fax:  [phone]  Name: [name] MRN: [id number] Date of  Birth: 1946/03/18

## 2017-06-21 ENCOUNTER — Other Ambulatory Visit: Payer: Self-pay | Admitting: Family Medicine

## 2017-06-25 DIAGNOSIS — C61 Malignant neoplasm of prostate: Secondary | ICD-10-CM | POA: Diagnosis not present

## 2017-06-29 ENCOUNTER — Ambulatory Visit (INDEPENDENT_AMBULATORY_CARE_PROVIDER_SITE_OTHER): Payer: Medicare Other | Admitting: Urology

## 2017-06-29 DIAGNOSIS — N3941 Urge incontinence: Secondary | ICD-10-CM | POA: Diagnosis not present

## 2017-06-29 DIAGNOSIS — R9721 Rising PSA following treatment for malignant neoplasm of prostate: Secondary | ICD-10-CM

## 2017-06-29 DIAGNOSIS — C61 Malignant neoplasm of prostate: Secondary | ICD-10-CM

## 2017-06-29 DIAGNOSIS — R351 Nocturia: Secondary | ICD-10-CM | POA: Diagnosis not present

## 2017-07-02 DIAGNOSIS — S42293A Other displaced fracture of upper end of unspecified humerus, initial encounter for closed fracture: Secondary | ICD-10-CM

## 2017-07-02 DIAGNOSIS — Z8781 Personal history of (healed) traumatic fracture: Secondary | ICD-10-CM

## 2017-07-02 HISTORY — DX: Personal history of (healed) traumatic fracture: Z87.81

## 2017-07-02 HISTORY — DX: Other displaced fracture of upper end of unspecified humerus, initial encounter for closed fracture: S42.293A

## 2017-07-03 DIAGNOSIS — S52134D Nondisplaced fracture of neck of right radius, subsequent encounter for closed fracture with routine healing: Secondary | ICD-10-CM | POA: Diagnosis not present

## 2017-07-20 ENCOUNTER — Emergency Department (HOSPITAL_COMMUNITY): Payer: Medicare Other

## 2017-07-20 ENCOUNTER — Emergency Department (HOSPITAL_COMMUNITY)
Admission: EM | Admit: 2017-07-20 | Discharge: 2017-07-20 | Disposition: A | Discharge status: Home / Self Care | Payer: Medicare Other | Attending: Emergency Medicine | Admitting: Emergency Medicine

## 2017-07-20 ENCOUNTER — Other Ambulatory Visit: Payer: Self-pay

## 2017-07-20 ENCOUNTER — Encounter (HOSPITAL_COMMUNITY): Payer: Self-pay | Admitting: Emergency Medicine

## 2017-07-20 DIAGNOSIS — N182 Chronic kidney disease, stage 2 (mild): Secondary | ICD-10-CM | POA: Diagnosis not present

## 2017-07-20 DIAGNOSIS — Y998 Other external cause status: Secondary | ICD-10-CM | POA: Insufficient documentation

## 2017-07-20 DIAGNOSIS — J449 Chronic obstructive pulmonary disease, unspecified: Secondary | ICD-10-CM | POA: Diagnosis not present

## 2017-07-20 DIAGNOSIS — S2241XA Multiple fractures of ribs, right side, initial encounter for closed fracture: Secondary | ICD-10-CM | POA: Insufficient documentation

## 2017-07-20 DIAGNOSIS — S42211A Unspecified displaced fracture of surgical neck of right humerus, initial encounter for closed fracture: Secondary | ICD-10-CM

## 2017-07-20 DIAGNOSIS — M25511 Pain in right shoulder: Secondary | ICD-10-CM | POA: Diagnosis not present

## 2017-07-20 DIAGNOSIS — I129 Hypertensive chronic kidney disease with stage 1 through stage 4 chronic kidney disease, or unspecified chronic kidney disease: Secondary | ICD-10-CM | POA: Insufficient documentation

## 2017-07-20 DIAGNOSIS — T148XXA Other injury of unspecified body region, initial encounter: Secondary | ICD-10-CM | POA: Diagnosis not present

## 2017-07-20 DIAGNOSIS — Y9389 Activity, other specified: Secondary | ICD-10-CM | POA: Insufficient documentation

## 2017-07-20 DIAGNOSIS — Z8546 Personal history of malignant neoplasm of prostate: Secondary | ICD-10-CM | POA: Insufficient documentation

## 2017-07-20 DIAGNOSIS — I2510 Atherosclerotic heart disease of native coronary artery without angina pectoris: Secondary | ICD-10-CM | POA: Insufficient documentation

## 2017-07-20 DIAGNOSIS — S6991XA Unspecified injury of right wrist, hand and finger(s), initial encounter: Secondary | ICD-10-CM | POA: Diagnosis present

## 2017-07-20 DIAGNOSIS — Y929 Unspecified place or not applicable: Secondary | ICD-10-CM | POA: Insufficient documentation

## 2017-07-20 DIAGNOSIS — W101XXA Fall (on)(from) sidewalk curb, initial encounter: Secondary | ICD-10-CM | POA: Diagnosis not present

## 2017-07-20 DIAGNOSIS — Z96651 Presence of right artificial knee joint: Secondary | ICD-10-CM | POA: Insufficient documentation

## 2017-07-20 DIAGNOSIS — F17210 Nicotine dependence, cigarettes, uncomplicated: Secondary | ICD-10-CM | POA: Insufficient documentation

## 2017-07-20 LAB — CBC WITH DIFFERENTIAL/PLATELET
Basophils Absolute: 0 10*3/uL (ref 0.0–0.1)
Basophils Relative: 0 %
Eosinophils Absolute: 0.2 10*3/uL (ref 0.0–0.7)
Eosinophils Relative: 2 %
HCT: 37.6 % — ABNORMAL LOW (ref 39.0–52.0)
Hemoglobin: 12.3 g/dL — ABNORMAL LOW (ref 13.0–17.0)
Lymphocytes Relative: 4 %
Lymphs Abs: 0.4 10*3/uL — ABNORMAL LOW (ref 0.7–4.0)
MCH: 29 pg (ref 26.0–34.0)
MCHC: 32.7 g/dL (ref 30.0–36.0)
MCV: 88.7 fL (ref 78.0–100.0)
Monocytes Absolute: 0.5 10*3/uL (ref 0.1–1.0)
Monocytes Relative: 5 %
Neutro Abs: 9.1 10*3/uL — ABNORMAL HIGH (ref 1.7–7.7)
Neutrophils Relative %: 89 %
Platelets: 223 10*3/uL (ref 150–400)
RBC: 4.24 MIL/uL (ref 4.22–5.81)
RDW: 15.3 % (ref 11.5–15.5)
WBC: 10.2 10*3/uL (ref 4.0–10.5)

## 2017-07-20 LAB — COMPREHENSIVE METABOLIC PANEL
ALT: 16 U/L — ABNORMAL LOW (ref 17–63)
AST: 17 U/L (ref 15–41)
Albumin: 3.5 g/dL (ref 3.5–5.0)
Alkaline Phosphatase: 78 U/L (ref 38–126)
Anion gap: 12 (ref 5–15)
BUN: 19 mg/dL (ref 6–20)
CO2: 22 mmol/L (ref 22–32)
Calcium: 8.4 mg/dL — ABNORMAL LOW (ref 8.9–10.3)
Chloride: 106 mmol/L (ref 101–111)
Creatinine, Ser: 1.86 mg/dL — ABNORMAL HIGH (ref 0.61–1.24)
GFR calc Af Amer: 40 mL/min — ABNORMAL LOW (ref >60)
GFR calc non Af Amer: 35 mL/min — ABNORMAL LOW (ref >60)
Glucose, Bld: 125 mg/dL — ABNORMAL HIGH (ref 65–99)
Potassium: 3.3 mmol/L — ABNORMAL LOW (ref 3.5–5.1)
Sodium: 140 mmol/L (ref 135–145)
Total Bilirubin: 0.9 mg/dL (ref 0.3–1.2)
Total Protein: 6.8 g/dL (ref 6.5–8.1)

## 2017-07-20 MED ORDER — ONDANSETRON 4 MG PO TBDP
4.0000 mg | ORAL_TABLET | Freq: Once | ORAL | Status: AC
  Administered 2017-07-20: 4 mg via ORAL
  Filled 2017-07-20: qty 1

## 2017-07-20 MED ORDER — HYDROMORPHONE HCL 1 MG/ML IJ SOLN
1.0000 mg | Freq: Once | INTRAMUSCULAR | Status: AC
  Administered 2017-07-20: 1 mg via INTRAMUSCULAR
  Filled 2017-07-20: qty 1

## 2017-07-20 MED ORDER — OXYCODONE-ACETAMINOPHEN 5-325 MG PO TABS: 1.0000 | ORAL_TABLET | Freq: Three times a day (TID) | ORAL | 0 refills | Status: DC | PRN

## 2017-07-20 MED ORDER — HYDROMORPHONE HCL 1 MG/ML IJ SOLN: 1.0000 mg | Freq: Once | INTRAMUSCULAR | Status: DC

## 2017-07-20 NOTE — ED Provider Notes (Signed)
Emergency Department Provider Note   I have reviewed the triage vital signs and the nursing notes.   HISTORY  Chief Complaint Fall   HPI [name] is a 72 y.o. male without significant history who was stepping over a curb when he tripped and fell on his outstretched right hand with subsequent right proximal humerus pain and right rib pain that has progressively worsened since this happened just prior to arrival. He has done nothing for pain.   No other associated or modifying symptoms.    Past Medical History:  Diagnosis Date  . Aortic atherosclerosis (HCC)    Penetrating aortic ulcer s/p endograft 2004 - followed by Dr. Trula Slade  . Arthritis   . COPD (chronic obstructive pulmonary disease) ([place])   . Coronary atherosclerosis of native coronary artery    Mild atherosclerosis at catheterization 2011  . Essential hypertension, benign   . GERD (gastroesophageal reflux disease)   . Gout   . History of kidney stones   . Neuropathy   . Prostate cancer ([place])    XRT  . Sleep apnea    Intolerant of CPAP  . Tubular adenoma of colon 08/2008    Patient Active Problem List   Diagnosis Date Noted  . Dysphagia 07/10/2016  . Diarrhea 07/10/2016  . BMI 40.0-44.9, adult ([place]) 05/31/2015  . Neuropathy 03/17/2015  . Prostate cancer ([place]) 06/30/2013  . Abdominal aneurysm without mention of rupture 02/19/2013  . Need for prophylactic vaccination and inoculation against influenza 02/03/2013  . ANXIETY 01/14/2013  . OBESITY NOS 01/14/2013  . VITAMIN B12 DEFICIENCY 01/14/2013  . TOBACCO ABUSE 01/14/2013  . PTSD 01/14/2013  . COPD (chronic obstructive pulmonary disease) ([place])   . OSA (obstructive sleep apnea) 08/14/2012  . RLS (restless legs syndrome) 08/14/2012  . Arthritis of both knees 08/14/2012  . Prediabetes 08/14/2012  . Nephrolithiasis 08/14/2012  . Aortic atherosclerosis ([place]) 02/19/2012  . Coronary atherosclerosis of native coronary artery 03/23/2010  . CHRONIC  KIDNEY DISEASE STAGE II (MILD) 10/13/2009  . GERD 07/30/2008  . BENIGN PROSTATIC HYPERTROPHY, WITH OBSTRUCTION 02/20/2008  . Hyperlipidemia 01/31/2007  . GOUT 01/31/2007  . Essential hypertension, benign 01/31/2007    Past Surgical History:  Procedure Laterality Date  . Aortic endograft repair  2004   Dr. [name]  . CHEST TUBE INSERTION  1989   "collapsed lung"due to injury  . CIRCUMCISION  09/26/2011   Procedure: CIRCUMCISION ADULT;  Surgeon: [name], MD;  Location: [facility];  Service: Urology;  Laterality: N/A;  . COLONOSCOPY W/ POLYPECTOMY    . COLONOSCOPY WITH PROPOFOL N/A 04/27/2016   Procedure: COLONOSCOPY WITH PROPOFOL;  Surgeon: [name], MD;  [facility];  Service: Endoscopy;  Laterality: N/A;  1115-moved to 845 per G[place]  . CYSTOSCOPY  09/26/2011   Procedure: CYSTOSCOPY;  Surgeon: [name], MD;  Location: [facility];  Service: Urology;  Laterality: N/A;  . ENDOVASCULAR STENT INSERTION  September 13, 2007   EVAR - Aortic stent graft  . ESOPHAGOGASTRODUODENOSCOPY (EGD) WITH PROPOFOL N/A 04/27/2016   Procedure: ESOPHAGOGASTRODUODENOSCOPY (EGD) WITH PROPOFOL;  Surgeon: [name], MD;  [facility];  Service: Endoscopy;  Laterality: N/A;  . EVAR    . FRACTURE SURGERY     Bilateral lower arms as a child  . KNEE ARTHROSCOPY     Left  . KNEE SURGERY  Sept. 26, 2013   Torn minisc.- Right  knee  . MALONEY DILATION N/A 04/27/2016   Procedure: Venia Minks  DILATION;  Surgeon: [name], MD;  [facility];  Service: Endoscopy;  Laterality: N/A;  . PARTIAL KNEE ARTHROPLASTY Right 04/26/2015   Procedure: RIGHT KNEE MEDIAL UNICOMPARTMENTAL ARTHROPLASTY;  Surgeon: Gaynelle Arabian, MD;  Location: [facility];  Service: Orthopedics;  Laterality: Right;  . POLYPECTOMY  04/27/2016   Procedure: POLYPECTOMY;  Surgeon: [name], MD;  [facility];  Service: Endoscopy;;  cecal , ascending, descending, and sigmoid polypectomies  . PROSTATE BIOPSY   09/26/2011   Procedure: BIOPSY TRANSRECTAL ULTRASONIC PROSTATE (TUBP);  Surgeon: [name], MD;  Location: [facility];  Service: Urology;  Laterality: N/A;     . UMBILICAL HERNIA REPAIR      Current Outpatient Rx  . Order #: [id number] Class: Normal  . Order #: [id number] Class: Normal  . Order #: [id number] Class: Normal  . Order #: [id number] Class: Normal  . Order #: [id number] Class: Normal  . Order #: [id number] Class: Historical Med  . Order #: [id number] Class: Normal  . Order #: [id number] Class: Historical Med  . Order #: [id number] Class: Print  . Order #: [id number] Class: Print    Allergies Carbidopa-levodopa; Morphine sulfate; and Sulfacetamide sodium  Family History  Problem Relation Age of Onset  . Stroke Mother   . Alzheimer's disease Mother 51       probably due to head injury  . Mesothelioma Father   . Cancer Father        mesothelioma  . Hyperlipidemia Sister   . Heart attack Sister   . Cancer Brother 92  . Heart disease Brother   . Hyperlipidemia Brother   . Cancer Brother        kidney  . Healthy Daughter   . Healthy Son   . Healthy Son   . Colon cancer Neg Hx     Social History Social History   Tobacco Use  . Smoking status: Current Every Day Smoker    Packs/day: 0.50    Years: 42.00    Pack years: 21.00    Types: Cigarettes  . Smokeless tobacco: Never Used  . Tobacco comment: 12 ciggs per day  Substance Use Topics  . Alcohol use: No    Alcohol/week: 0.0 oz  . Drug use: No    Review of Systems  All other systems negative except as documented in the HPI. All pertinent positives and negatives as reviewed in the HPI. ____________________________________________   PHYSICAL EXAM:  VITAL SIGNS: ED Triage Vitals  Enc Vitals Group     BP 07/20/17 1252 (!) 159/80     Pulse Rate 07/20/17 1252 60     Resp 07/20/17 1252 18     Temp 07/20/17 1252 97.6 F (36.4 C)     Temp Source 07/20/17 1252 Oral     SpO2 07/20/17 1252 99 %     Weight 07/20/17 1254  272 lb (123.4 kg)     Height 07/20/17 1254 5\' 8"  (1.727 m)    Constitutional: Alert and oriented. Well appearing and in no acute distress. Eyes: Conjunctivae are normal. PERRL. EOMI. Head: Atraumatic. Nose: No congestion/rhinnorhea. Mouth/Throat: Mucous membranes are moist.  Oropharynx non-erythematous. Neck: No stridor.  No meningeal signs.   Cardiovascular: Normal rate, regular rhythm. Good peripheral circulation. Grossly normal heart sounds.   Respiratory: Normal respiratory effort.  No retractions. Lungs CTAB. Gastrointestinal: Soft and nontender. No distention.  Musculoskeletal: No lower extremity tenderness nor edema. No gross deformities of extremities. Pain to palpation of upper humerus, patient is holding shoulder in position of comfort so  difficult to assess for deformity or dislocation. NVI distally. Neurologic:  Normal speech and language. No gross focal neurologic deficits are appreciated.  Skin:  Skin is warm, dry and intact. No rash noted.   ____________________________________________   LABS (all labs ordered are listed, but only abnormal results are displayed)  Labs Reviewed  CBC WITH DIFFERENTIAL/PLATELET - Abnormal; Notable for the following components:      Result Value   Hemoglobin 12.3 (*)    HCT 37.6 (*)    Neutro Abs 9.1 (*)    Lymphs Abs 0.4 (*)    All other components within normal limits  COMPREHENSIVE METABOLIC PANEL - Abnormal; Notable for the following components:   Potassium 3.3 (*)    Glucose, Bld 125 (*)    Creatinine, Ser 1.86 (*)    Calcium 8.4 (*)    ALT 16 (*)    GFR calc non Af Amer 35 (*)    GFR calc Af Amer 40 (*)    All other components within normal limits   ____________________________________________  EKG   ____________________________________________  RADIOLOGY  Dg Ribs Unilateral W/chest Right  Result Date: 07/20/2017 CLINICAL DATA:  Right anterior rib pain secondary to a fall. EXAM: RIGHT RIBS AND CHEST - 3+ VIEW  COMPARISON:  Radiographs dated 07/19/2015 FINDINGS: There are slightly displaced fractures of the anterolateral aspects of the right sixth, seventh and eighth ribs. No pneumothorax. No lung contusion or pleural effusion. There is also a comminuted angulated fracture of the right humeral head. Incidental note made of a calcified lymph node in the right hilum. Extensive osteophytes in the thoracic spine. IMPRESSION: 1. Acute fractures of the anterolateral aspects of the right sixth, seventh and eighth ribs. 2. Acute right humeral head fracture. Electronically Signed   By: Lorriane Shire M.D.   On: 07/20/2017 15:00   Dg Shoulder Right  Result Date: 07/20/2017 CLINICAL DATA:  Fall. EXAM: RIGHT SHOULDER - 2+ VIEW; RIGHT HUMERUS - 2+ VIEW COMPARISON:  None. FINDINGS: There is an acute, predominantly transverse fracture through the surgical neck of the proximal humerus. Mild impaction and anterior displacement. No dislocation. Mild acromioclavicular osteoarthritis. The elbow is grossly unremarkable. Soft tissues are unremarkable. IMPRESSION: 1. Mildly displaced fracture of the proximal humerus surgical neck. No dislocation. Electronically Signed   By: Titus Dubin M.D.   On: 07/20/2017 14:56   Ct Shoulder Right Wo Contrast  Result Date: 07/20/2017 CLINICAL DATA:  The patient suffered a right humerus fracture in a trip and fall today. Initial encounter. EXAM: CT OF THE UPPER RIGHT EXTREMITY WITHOUT CONTRAST TECHNIQUE: Multidetector CT imaging of the upper right extremity was performed according to the standard protocol. COMPARISON:  Plain films right shoulder earlier today. FINDINGS: Bones/Joint/Cartilage The patient has a transverse surgical neck fracture of the right humerus with medial displacement of the diaphysis of 1 cm anterior displacement of 0.7 cm. The fracture extends superiorly into both the greater and lesser tuberosities of the humerus. The greater tuberosity is laterally displaced approximately 1  cm and superiorly displaced approximately 0.7 cm. The lesser tuberosity component is nondisplaced. The humeral head is located. The acromion is type 2. There is moderate acromioclavicular osteoarthritis. No lytic or sclerotic lesion is identified. Ligaments Suboptimally assessed by CT. Muscles and Tendons The rotator cuff appears intact. Shoulder girdle musculature appears normal. Soft tissues Imaged lung parenchyma demonstrates a apical scar. Atherosclerotic vascular disease is seen. IMPRESSION: Surgical neck fracture of the right humerus involving both the greater and lesser tuberosities as described above.  Electronically Signed   By: Inge Rise M.D.   On: 07/20/2017 17:35   Dg Humerus Right  Result Date: 07/20/2017 CLINICAL DATA:  Fall. EXAM: RIGHT SHOULDER - 2+ VIEW; RIGHT HUMERUS - 2+ VIEW COMPARISON:  None. FINDINGS: There is an acute, predominantly transverse fracture through the surgical neck of the proximal humerus. Mild impaction and anterior displacement. No dislocation. Mild acromioclavicular osteoarthritis. The elbow is grossly unremarkable. Soft tissues are unremarkable. IMPRESSION: 1. Mildly displaced fracture of the proximal humerus surgical neck. No dislocation. Electronically Signed   By: Titus Dubin M.D.   On: 07/20/2017 14:56    ____________________________________________   PROCEDURES  Procedure(s) performed:   Procedures   ____________________________________________   INITIAL IMPRESSION / ASSESSMENT AND PLAN / ED COURSE  R humeral fracture and multiple right rib fratures.   I discussed with Dr. Lyla Glassing from [facility] orthopedics who suggested call back on Monday to get an appointment with Dr. Alma Friendly or Stann Mainland, this information was relayed to the patient.  Patient was put in a shoulder immobilizer and pain controlled.  Prescription for pain medications given.  Incentive spirometer and instructions provided.  Stable for discharge this time will return if pain  is not controlled or any other new issues.    Pertinent labs & imaging results that were available during my care of the patient were reviewed by me and considered in my medical decision making (see chart for details).  ____________________________________________  FINAL CLINICAL IMPRESSION(S) / ED DIAGNOSES  Final diagnoses:  Closed displaced fracture of surgical neck of right humerus, unspecified fracture morphology, initial encounter  Closed fracture of multiple ribs of right side, initial encounter     MEDICATIONS GIVEN DURING THIS VISIT:  Medications  HYDROmorphone (DILAUDID) injection 1 mg (0 mg Intravenous Hold 07/20/17 1540)  HYDROmorphone (DILAUDID) injection 1 mg (1 mg Intramuscular Given 07/20/17 1343)  ondansetron (ZOFRAN-ODT) disintegrating tablet 4 mg (4 mg Oral Given 07/20/17 1343)     NEW OUTPATIENT MEDICATIONS STARTED DURING THIS VISIT:  New Prescriptions   OXYCODONE-ACETAMINOPHEN (PERCOCET/ROXICET) 5-325 MG TABLET    Take 1-2 tablets by mouth every 8 (eight) hours as needed for severe pain.   OXYCODONE-ACETAMINOPHEN (PERCOCET/ROXICET) 5-325 MG TABLET    Take 1-2 tablets by mouth every 8 (eight) hours as needed for severe pain.    Note:  This note was prepared with assistance of Dragon voice recognition software. Occasional wrong-word or sound-a-like substitutions may have occurred due to the inherent limitations of voice recognition software.   Merrily Pew, MD 07/20/17 1910

## 2017-07-20 NOTE — ED Triage Notes (Signed)
Pt tripped and fell face first.  C/o rt shoulder pain and rt rib pain.  Pt is a&o x 4.

## 2017-07-20 NOTE — ED Notes (Signed)
Patient transported to CT 

## 2017-07-23 ENCOUNTER — Other Ambulatory Visit: Payer: Self-pay | Admitting: Family Medicine

## 2017-07-23 DIAGNOSIS — G6289 Other specified polyneuropathies: Secondary | ICD-10-CM

## 2017-07-25 DIAGNOSIS — S42291A Other displaced fracture of upper end of right humerus, initial encounter for closed fracture: Secondary | ICD-10-CM | POA: Diagnosis not present

## 2017-08-03 ENCOUNTER — Other Ambulatory Visit: Payer: Self-pay | Admitting: Family Medicine

## 2017-08-06 ENCOUNTER — Encounter: Payer: Self-pay | Admitting: Physician Assistant

## 2017-08-06 ENCOUNTER — Ambulatory Visit (INDEPENDENT_AMBULATORY_CARE_PROVIDER_SITE_OTHER): Payer: Medicare Other | Admitting: Physician Assistant

## 2017-08-06 VITALS — BP 155/94 | HR 76 | Temp 97.3°F | Ht 68.0 in | Wt 282.4 lb

## 2017-08-06 DIAGNOSIS — J209 Acute bronchitis, unspecified: Secondary | ICD-10-CM

## 2017-08-06 MED ORDER — CEFDINIR 300 MG PO CAPS: 300.0000 mg | ORAL_CAPSULE | Freq: Two times a day (BID) | ORAL | 0 refills | Status: DC

## 2017-08-06 MED ORDER — METHYLPREDNISOLONE ACETATE 80 MG/ML IJ SUSP
80.0000 mg | Freq: Once | INTRAMUSCULAR | Status: AC
  Administered 2017-08-06: 80 mg via INTRAMUSCULAR

## 2017-08-06 MED ORDER — BENZONATATE 200 MG PO CAPS: 200.0000 mg | ORAL_CAPSULE | Freq: Two times a day (BID) | ORAL | 0 refills | Status: DC | PRN

## 2017-08-06 NOTE — Progress Notes (Signed)
BP (!) 155/94   Pulse 76   Temp (!) 97.3 F (36.3 C) (Oral)   Ht 5\' 8"  (1.727 m)   Wt 282 lb 6.4 oz (128.1 kg)   SpO2 95%   BMI 42.94 kg/m    Subjective:    Patient ID: [name], male    DOB: 08/02/1945, 72 y.o.   MRN: [id number]  HPI: [name] is a 72 y.o. male presenting on 08/06/2017 for Cough (x 5 day) and Nasal Congestion  Patient with several days of progressing upper respiratory and bronchial symptoms. Initially there was more upper respiratory congestion. This progressed to having significant cough that is productive throughout the day and severe at night. There is occasional wheezing after coughing. Sometimes there is slight dyspnea on exertion. It is productive mucus that is yellow in color. Denies any blood.   Past Medical History:  Diagnosis Date  . Aortic atherosclerosis (HCC)    Penetrating aortic ulcer s/p endograft 2004 - followed by Dr. Trula Slade  . Arthritis   . COPD (chronic obstructive pulmonary disease) ([place])   . Coronary atherosclerosis of native coronary artery    Mild atherosclerosis at catheterization 2011  . Essential hypertension, benign   . GERD (gastroesophageal reflux disease)   . Gout   . History of kidney stones   . Neuropathy   . Prostate cancer ([place])    XRT  . Sleep apnea    Intolerant of CPAP  . Tubular adenoma of colon 08/2008   Relevant past medical, surgical, family and social history reviewed and updated as indicated. Interim medical history since our last visit reviewed. Allergies and medications reviewed and updated. DATA REVIEWED: CHART IN EPIC  Family History reviewed for pertinent findings.  Review of Systems  Constitutional: Positive for fatigue. Negative for appetite change.  HENT: Positive for sinus pressure and sore throat.   Eyes: Negative.  Negative for pain and visual disturbance.  Respiratory: Positive for shortness of breath and wheezing. Negative for cough and chest tightness.   Cardiovascular:  Negative.  Negative for chest pain, palpitations and leg swelling.  Gastrointestinal: Negative.  Negative for abdominal pain, diarrhea, nausea and vomiting.  Endocrine: Negative.   Genitourinary: Negative.   Musculoskeletal: Positive for back pain and myalgias.  Skin: Negative.  Negative for color change and rash.  Neurological: Positive for headaches. Negative for weakness and numbness.  Psychiatric/Behavioral: Negative.     Allergies as of 08/06/2017      Reactions   Carbidopa-levodopa Other (See Comments)   hallunications   Morphine Sulfate Nausea And Vomiting   Sulfacetamide Sodium Nausea And Vomiting      Medication List        Accurate as of 08/06/17  8:27 PM. Always use your most recent med list.          allopurinol 300 MG tablet Commonly known as:  ZYLOPRIM TAKE 1 TABLET BY MOUTH ONCE DAILY   amLODipine 10 MG tablet Commonly known as:  NORVASC TAKE 1 TABLET BY MOUTH ONCE DAILY   benzonatate 200 MG capsule Commonly known as:  TESSALON Take 1 capsule (200 mg total) by mouth 2 (two) times daily as needed for cough.   carvedilol 25 MG tablet Commonly known as:  COREG Take 1 tablet (25 mg total) by mouth 2 (two) times daily.   cefdinir 300 MG capsule Commonly known as:  OMNICEF Take 1 capsule (300 mg total) by mouth 2 (two) times daily.   DULoxetine 30 MG capsule Commonly known  as:  CYMBALTA TAKE 1 CAPSULE BY MOUTH ONCE DAILY   losartan 50 MG tablet Commonly known as:  COZAAR TAKE 1 TABLET BY MOUTH ONCE DAILY   MYRBETRIQ 25 MG Tb24 tablet Generic drug:  mirabegron ER Take 25 mg by mouth daily.   oxyCODONE-acetaminophen 5-325 MG tablet Commonly known as:  PERCOCET/ROXICET Take 1-2 tablets by mouth every 8 (eight) hours as needed for severe pain.   oxyCODONE-acetaminophen 5-325 MG tablet Commonly known as:  PERCOCET/ROXICET Take 1-2 tablets by mouth every 8 (eight) hours as needed for severe pain.   pantoprazole 40 MG tablet Commonly known as:   PROTONIX TAKE 1 TABLET BY MOUTH TWICE DAILY   Trospium Chloride 60 MG Cp24 Take 60 mg by mouth daily.          Objective:    BP (!) 155/94   Pulse 76   Temp (!) 97.3 F (36.3 C) (Oral)   Ht 5\' 8"  (1.727 m)   Wt 282 lb 6.4 oz (128.1 kg)   SpO2 95%   BMI 42.94 kg/m   Allergies  Allergen Reactions  . Carbidopa-Levodopa Other (See Comments)    hallunications  . Morphine Sulfate Nausea And Vomiting  . Sulfacetamide Sodium Nausea And Vomiting    Wt Readings from Last 3 Encounters:  08/06/17 282 lb 6.4 oz (128.1 kg)  07/20/17 272 lb (123.4 kg)  05/28/17 283 lb (128.4 kg)    Physical Exam  Constitutional: He appears well-developed and well-nourished.  HENT:  Head: Normocephalic and atraumatic.  Right Ear: Hearing and tympanic membrane normal.  Left Ear: Hearing and tympanic membrane normal.  Nose: Mucosal edema and sinus tenderness present. No nasal deformity. Right sinus exhibits frontal sinus tenderness. Left sinus exhibits frontal sinus tenderness.  Mouth/Throat: Posterior oropharyngeal erythema present.  Eyes: Pupils are equal, round, and reactive to light. Conjunctivae and EOM are normal. Right eye exhibits no discharge. Left eye exhibits no discharge.  Neck: Normal range of motion. Neck supple.  Cardiovascular: Normal rate, regular rhythm and normal heart sounds.  Pulmonary/Chest: Effort normal. No respiratory distress. He has no decreased breath sounds. He has wheezes. He has no rhonchi. He has no rales.  Abdominal: Soft. Bowel sounds are normal.  Musculoskeletal: Normal range of motion.  Skin: Skin is warm and dry.        Assessment & Plan:   1. Bronchitis with bronchospasm - methylPREDNISolone acetate (DEPO-MEDROL) injection 80 mg - cefdinir (OMNICEF) 300 MG capsule; Take 1 capsule (300 mg total) by mouth 2 (two) times daily.  Dispense: 20 capsule; Refill: 0 - benzonatate (TESSALON) 200 MG capsule; Take 1 capsule (200 mg total) by mouth 2 (two) times daily  as needed for cough.  Dispense: 40 capsule; Refill: 0   Continue all other maintenance medications as listed above.  Follow up plan: No follow-ups on file.  Educational handout given for [place] PA-C [place] [address]  [place], [place] [address] [phone]   08/06/2017, 8:27 PM

## 2017-08-06 NOTE — Patient Instructions (Signed)
In a few days you may receive a survey in the mail or online from [employer] regarding your visit with us today. Please take a moment to fill this out. Your feedback is very important to our whole office. It can help us better understand your needs as well as improve your experience and satisfaction. Thank you for taking your time to complete it. We care about you.  Leyna Vanderkolk, PA-C  

## 2017-08-08 DIAGNOSIS — S42291A Other displaced fracture of upper end of right humerus, initial encounter for closed fracture: Secondary | ICD-10-CM | POA: Diagnosis not present

## 2017-09-05 DIAGNOSIS — S42291A Other displaced fracture of upper end of right humerus, initial encounter for closed fracture: Secondary | ICD-10-CM | POA: Diagnosis not present

## 2017-09-12 ENCOUNTER — Ambulatory Visit (INDEPENDENT_AMBULATORY_CARE_PROVIDER_SITE_OTHER): Payer: Medicare Other | Admitting: Family Medicine

## 2017-09-12 ENCOUNTER — Encounter: Payer: Self-pay | Admitting: Family Medicine

## 2017-09-12 VITALS — BP 144/82 | HR 61 | Temp 98.0°F | Ht 68.0 in | Wt 274.0 lb

## 2017-09-12 DIAGNOSIS — G6289 Other specified polyneuropathies: Secondary | ICD-10-CM | POA: Diagnosis not present

## 2017-09-12 DIAGNOSIS — J441 Chronic obstructive pulmonary disease with (acute) exacerbation: Secondary | ICD-10-CM

## 2017-09-12 NOTE — Progress Notes (Signed)
BP (!) 173/95   Pulse 61   Temp 98 F (36.7 C) (Oral)   Ht 5\' 8"  (1.727 m)   Wt 274 lb (124.3 kg)   BMI 41.66 kg/m    Subjective:    Patient ID: [name], male    DOB: May 01, 1945, 72 y.o.   MRN: [id number]  HPI: [name] is a 72 y.o. male presenting on 09/12/2017 for Cough (ongoing x 6 weeks) and Falling (fell 4 weeks ago and broke right shoulder, concerned about what may be causing him to fall frequently)   HPI Cough and congestion and wheezing He has been having cough and congestion and wheezing.  This is been going on for 6 weeks but he feels like it is worse over the past week.  He feels like his chest is tight and he is short of breath even with short distances.  He does admit that the pain in his shoulder has been bothering him as well on the breathing since he fell 4 weeks ago and broke his right shoulder.  He is also concerned about what may be causing him to fall frequently.  He denies any lightheadedness or dizziness but he says it will just be walking and then stumbled on something and go down easily.  He says that he cannot feel his feet and feels like he is walking on frozen feet.  He denies any pain with it but just says he cannot feel them so when he walks he trips easily because of the numbness.  Relevant past medical, surgical, family and social history reviewed and updated as indicated. Interim medical history since our last visit reviewed. Allergies and medications reviewed and updated.  Review of Systems  Constitutional: Negative for chills and fever.  Respiratory: Positive for cough, chest tightness and wheezing. Negative for shortness of breath.   Cardiovascular: Negative for chest pain and leg swelling.  Musculoskeletal: Negative for back pain and gait problem.  Skin: Negative for rash.  Neurological: Positive for numbness. Negative for dizziness and weakness.  All other systems reviewed and are negative.   Per HPI unless specifically  indicated above   Allergies as of 09/12/2017      Reactions   Carbidopa-levodopa Other (See Comments)   hallunications   Morphine Sulfate Nausea And Vomiting   Sulfacetamide Sodium Nausea And Vomiting      Medication List        Accurate as of 09/12/17  2:12 PM. Always use your most recent med list.          allopurinol 300 MG tablet Commonly known as:  ZYLOPRIM TAKE 1 TABLET BY MOUTH ONCE DAILY   amLODipine 10 MG tablet Commonly known as:  NORVASC TAKE 1 TABLET BY MOUTH ONCE DAILY   carvedilol 25 MG tablet Commonly known as:  COREG Take 1 tablet (25 mg total) by mouth 2 (two) times daily.   DULoxetine 30 MG capsule Commonly known as:  CYMBALTA TAKE 1 CAPSULE BY MOUTH ONCE DAILY   losartan 50 MG tablet Commonly known as:  COZAAR TAKE 1 TABLET BY MOUTH ONCE DAILY   MYRBETRIQ 25 MG Tb24 tablet Generic drug:  mirabegron ER Take 25 mg by mouth daily.   oxyCODONE-acetaminophen 5-325 MG tablet Commonly known as:  PERCOCET/ROXICET Take 1-2 tablets by mouth every 8 (eight) hours as needed for severe pain.   oxyCODONE-acetaminophen 5-325 MG tablet Commonly known as:  PERCOCET/ROXICET Take 1-2 tablets by mouth every 8 (eight) hours as needed for severe  pain.   pantoprazole 40 MG tablet Commonly known as:  PROTONIX TAKE 1 TABLET BY MOUTH TWICE DAILY          Objective:    BP (!) 173/95   Pulse 61   Temp 98 F (36.7 C) (Oral)   Ht 5\' 8"  (1.727 m)   Wt 274 lb (124.3 kg)   BMI 41.66 kg/m   Wt Readings from Last 3 Encounters:  09/12/17 274 lb (124.3 kg)  08/06/17 282 lb 6.4 oz (128.1 kg)  07/20/17 272 lb (123.4 kg)    Physical Exam  Constitutional: He is oriented to person, place, and time. He appears well-developed and well-nourished. No distress.  Eyes: Conjunctivae are normal. No scleral icterus.  Neck: Neck supple. No thyromegaly present.  Cardiovascular: Normal rate, regular rhythm, normal heart sounds and intact distal pulses.  No murmur  heard. Pulmonary/Chest: Effort normal. No respiratory distress. He has wheezes. He exhibits no tenderness.  Musculoskeletal: Normal range of motion. He exhibits no edema.  Lymphadenopathy:    He has no cervical adenopathy.  Neurological: He is alert and oriented to person, place, and time. Coordination normal.  Skin: Skin is warm and dry. No rash noted. He is not diaphoretic.  Psychiatric: He has a normal mood and affect. His behavior is normal.  Nursing note and vitals reviewed.       Assessment & Plan:   Problem List Items Addressed This Visit    None    Visit Diagnoses    COPD exacerbation ([place])    -  Primary   Relevant Medications   predniSONE (DELTASONE) 20 MG tablet   Other polyneuropathy           Follow up plan: Return if symptoms worsen or fail to improve.  Counseling provided for all of the vaccine components No orders of the defined types were placed in this encounter.   Caryl Pina, MD [facility] 09/12/2017, 2:12 PM

## 2017-09-14 ENCOUNTER — Encounter (HOSPITAL_COMMUNITY): Payer: Self-pay

## 2017-09-14 ENCOUNTER — Ambulatory Visit (HOSPITAL_COMMUNITY): Payer: Medicare Other | Attending: Orthopedic Surgery

## 2017-09-14 ENCOUNTER — Other Ambulatory Visit: Payer: Self-pay

## 2017-09-14 DIAGNOSIS — M25611 Stiffness of right shoulder, not elsewhere classified: Secondary | ICD-10-CM | POA: Insufficient documentation

## 2017-09-14 DIAGNOSIS — R278 Other lack of coordination: Secondary | ICD-10-CM | POA: Diagnosis not present

## 2017-09-14 DIAGNOSIS — R29898 Other symptoms and signs involving the musculoskeletal system: Secondary | ICD-10-CM | POA: Insufficient documentation

## 2017-09-14 DIAGNOSIS — M25511 Pain in right shoulder: Secondary | ICD-10-CM | POA: Diagnosis not present

## 2017-09-14 NOTE — Therapy (Addendum)
[name], [place], [address] Phone: [phone]   Fax:  [phone]  Occupational Therapy Evaluation  Patient Details  Name: [name] MRN: [id number] Date of Birth: 10-06-1945 Referring Provider: Dr. Victorino December   Encounter Date: 09/14/2017   09/14/17 1248  OT Visits / Re-Eval  Visit Number 1  Number of Visits 24  Date for OT Re-Evaluation 12/07/17 (mini reassessment due 10/12/2017)  Authorization  Authorization Type Primary insurance is Medicare; secondary is BCBC; visit limit of 60 with 7 used.  Authorization Time Period per calendar year  Authorization - Visit Number 8  Authorization - Number of Visits 50  OT Time Calculation  OT Start Time 1034  OT Stop Time 1103 (Patient requested to leave early)  OT Time Calculation (min) 29 min  End of Session  Activity Tolerance Patient tolerated treatment well;Patient limited by pain  Behavior During Therapy [facility] [place] for tasks assessed/performed      09/14/17 1222  Symptoms/Limitations  Subjective  S: It not fully healed yet so it is very painful. I can barely move it.  Pertinent History Patient is a 72 y/o male with a right humeral fracture following a fall while walking into a grocery store on 07/30/17. Patient referred for occupational therapy for shoulder mobility, ROM, stability, and strengthening following 12 week protocol.  Patient Stated Goals To be able to move his arm like he used to.  Pain Assessment  Currently in Pain? Yes  Pain Score 7  Pain Location Shoulder  Pain Orientation Right  Pain Descriptors / Indicators Sore  Pain Type Acute pain  Pain Radiating Towards N/A  Pain Onset 1 to 4 weeks ago  Pain Frequency Constant  Aggravating Factors  movement  Pain Relieving Factors holding arm in safe position against chest  Effect of Pain on Daily Activities max effect on ADLs  Multiple Pain Sites No    Past Medical History:  Diagnosis Date   . Aortic atherosclerosis (H[facility])    Penetrating aortic ulcer s/p endograft 2004 - followed by Dr. Trula Slade  . Arthritis   . COPD (chronic obstructive pulmonary disease) ([place])   . Coronary atherosclerosis of native coronary artery    Mild atherosclerosis at catheterization 2011  . Essential hypertension, benign   . GERD (gastroesophageal reflux disease)   . Gout   . History of kidney stones   . Neuropathy   . Prostate cancer ([place])    XRT  . Sleep apnea    Intolerant of CPAP  . Tubular adenoma of colon 08/2008    Past Surgical History:  Procedure Laterality Date  . Aortic endograft repair  2004   Dr. [name]  . CHEST TUBE INSERTION  1989   "collapsed lung"due to injury  . CIRCUMCISION  09/26/2011   Procedure: CIRCUMCISION ADULT;  Surgeon: [name], MD;  Location: [facility];  Service: Urology;  Laterality: N/A;  . COLONOSCOPY W/ POLYPECTOMY    . COLONOSCOPY WITH PROPOFOL N/A 04/27/2016   Procedure: COLONOSCOPY WITH PROPOFOL;  Surgeon: [name], MD;  [facility];  Service: Endoscopy;  Laterality: N/A;  1115-moved to 845 per Ginger  . CYSTOSCOPY  09/26/2011   Procedure: CYSTOSCOPY;  Surgeon: [name], MD;  Location: [facility];  Service: Urology;  Laterality: N/A;  . ENDOVASCULAR STENT INSERTION  September 13, 2007   EVAR - Aortic stent graft  . ESOPHAGOGASTRODUODENOSCOPY (EGD) WITH PROPOFOL N/A 04/27/2016   Procedure: ESOPHAGOGASTRODUODENOSCOPY (EGD) WITH PROPOFOL;  Surgeon: [name], MD;  [facility];  Service: Endoscopy;  Laterality: N/A;  . EVAR    . FRACTURE SURGERY     Bilateral lower arms as a child  . KNEE ARTHROSCOPY     Left  . KNEE SURGERY  Sept. 26, 2013   Torn minisc.- Right  knee  . MALONEY DILATION N/A 04/27/2016   Procedure: Venia Minks DILATION;  Surgeon: [name], MD;  [facility];  Service: Endoscopy;  Laterality: N/A;  . PARTIAL KNEE ARTHROPLASTY Right 04/26/2015   Procedure: RIGHT KNEE MEDIAL UNICOMPARTMENTAL  ARTHROPLASTY;  Surgeon: Gaynelle Arabian, MD;  Location: [facility];  Service: Orthopedics;  Laterality: Right;  . POLYPECTOMY  04/27/2016   Procedure: POLYPECTOMY;  Surgeon: [name], MD;  [facility];  Service: Endoscopy;;  cecal , ascending, descending, and sigmoid polypectomies  . PROSTATE BIOPSY  09/26/2011   Procedure: BIOPSY TRANSRECTAL ULTRASONIC PROSTATE (TUBP);  Surgeon: [name], MD;  [facility];  Service: Urology;  Laterality: N/A;     . UMBILICAL HERNIA REPAIR      There were no vitals filed for this visit.      09/14/17 1027  Assessment  Medical Diagnosis right shoulder pain   Referring Provider Dr. Victorino December  Onset Date/Surgical Date 07/30/17  Hand Dominance Right  Next MD Visit 10/05/2017  Prior Therapy None  Precautions  Precautions Shoulder  Type of Shoulder Precautions Begin gentle P/ROM and AA/ROM; lifting limit with no more than 2#   Restrictions  Weight Bearing Restrictions Yes  Other Position/Activity Restrictions none weight bearing for RUE  Balance Screen  Has the patient fallen in the past 6 months Yes  How many times? 1  Has the patient had a decrease in activity level because of a fear of falling?  No  Is the patient reluctant to leave their home because of a fear of falling?  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  Leisure Enjoys fishing  ADL  ADL comments Patient is unable to use RUE for ADL or leisure tasks due to limited rROM, precautions, and pain   Mobility  Mobility Status Independent  Written Expression  Dominant Hand Right  Vision - History  Baseline Vision No visual deficits  Cognition  Overall Cognitive Status Within Functional Limits for tasks assessed  ROM /  Strength  AROM / PROM / Strength AROM;PROM;Strength  Palpation  Palpation comment Max fascial restrictions palpated in upper arm, trapezius, and scapularis regions  AROM  AROM Assessment Site Shoulder  Overall AROM Comments assessed supine; IR/er adducted   Right/Left Shoulder Right  Right Shoulder Flexion 35 Degrees  Right Shoulder ABduction 76 Degrees  Right Shoulder Internal Rotation 90 Degrees  Right Shoulder External Rotation 25 Degrees  PROM  Overall PROM Comments assessed supine; IR/er adducted  PROM Assessment Site Shoulder  Right/Left Shoulder Right  Right Shoulder Flexion 60 Degrees  Right Shoulder ABduction 80 Degrees  Right Shoulder Internal Rotation 90 Degrees  Right Shoulder External Rotation 30 Degrees  Strength  Overall Strength Unable to assess;Due to precautions                      OT Short Term Goals - 09/14/17 1302  OT SHORT TERM GOAL #1   Title  Patient will be educated and independent with HEP to faciliate progress in therapy and allow him to return to using his RUE as his dominant extremity for all daily tasks.    Time  6    Period  Weeks    Status  New    Target Date  10/26/17      OT SHORT TERM GOAL #2   Title  Patient will increase RUE strength to 3+/5 in order to complete daily and leisure tasks.    Time  6    Period  Weeks    Status  New      OT SHORT TERM GOAL #3   Title   Patient will increase RUE P/ROM to [facility] to increase ability to complete ADLs and be able to go fishing.    Time  6    Period  Weeks    Status  New      OT SHORT TERM GOAL #4   Title   Patient will decrease pain level in RUE during daily tasks to 5/10 or less.      Time  6    Period  Weeks    Status  New      OT SHORT TERM GOAL #5   Title  Patient will decrease fascial restrictions in RUE to min amount or less in order to increase functional mobility needed to complete functional reaching tasks.      Time  6    Period  Weeks    Status  New         OT Long Term Goals - 09/14/17 1307      OT LONG TERM GOAL #1   Title  Patient will return to highest level of independence with all daily and leisure tasks while using her RUE as his dominant.      Time  12    Period  Weeks    Status  New    Target Date  12/07/17      OT LONG TERM GOAL #2   Title  Patient will increase his RUE strength to 4/5 in order to return to be able to complete all daily routine tasks.    Time  12    Period  Weeks    Status  New      OT LONG TERM GOAL #3   Title   Patient will increase RUE A/ROM to [facility] to increase ability to complete functional reaching tasks above head.     Time  12    Period  Weeks    Status  New      OT LONG TERM GOAL #4   Title  Patient will report a decrease in pain level for RUE while completing daily tasks and fishing to 3/10 or less.      Time  12    Period  Weeks    Status  New      OT LONG TERM GOAL #5   Title  Patient will decrease fascial restrictions to trace amount or less in RUE in order to increase functional mobility needed to complete tasks.      Time  12    Period  Weeks    Status  New           09/14/17 1247  OT Education  Education Details Patient given table slides as part of HEP. Therapist demonstrated to patient and patient verbalized understanding.  Person(s) Educated Patient  Methods Explanation;Demonstration;Verbal cues;Handout  Comprehension Verbalized understanding;Need further instruction       09/14/17 1251  OT Assessment and Plan  Clinical Impression Statement A: Patient reports high levels of pain and is reluctant to move his arm. He reports that he is unable to use it during daily or leisure activities. Patient reports he would like to return to fishing. Patient reports he recently stopped wearing the sling per MD orders. Measurements indicate impaired P/ROM and A/ROM. Strength not assessed due to precautions. Table slides given to patient as part of HEP. Patient required mod/max VC for  form and technique. Session ended early per patient request since he had to return home early.  Occupational Profile and client history currently impacting functional performance Patient was independent prior to injury and would like to return to PLOF. Patient reports he has been sleeping in a recliner because he cannot get out of his bed, but is very uncomfortable at night and has difficulty sleeping due to RUE pain.  Occupational performance deficits (Please refer to evaluation for details): ADL's;IADL's;Rest and Sleep  Pt will benefit from skilled therapeutic intervention in order to improve on the following deficits  Decreased range of motion;Decreased endurance;Decreased activity tolerance;Increased fascial restrictions;Impaired UE functional use;Pain;Decreased mobility;Decreased strength  Rehab Potential Good  Current Impairments/barriers affecting progress: high pain level  OT Frequency 2x / week  OT Duration 12 weeks  OT Treatment/Interventions Self-care/ADL training;Moist Heat;Therapeutic activities;Ultrasound;Therapeutic exercise;Cryotherapy;Neuromuscular education;Passive range of motion;Electrical Stimulation;Paraffin;Energy conservation;Manual Therapy;Patient/family education  Plan P: Patient will benefit from skilled OT services to increase functional performane during daily and leisure tasks. Complete FOTO and follow humeral fracture protocol. Patient is currently 6 weeks post fracture. Treatment Plan: myofascial release to address fascial restrictions in upper arm, trapezius, and scapularis region; gentle P/ROM; AA/ROM; modalities as needed.   Clinical Decision Making Moderate  Consulted and Agree with Plan of Care Patient     Patient will benefit from skilled therapeutic intervention in order to improve the following deficits and impairments:  Decreased range of motion, Decreased endurance, Decreased activity tolerance, Increased fascial restrictions, Impaired UE functional use, Pain,  Decreased mobility, Decreased strength  Visit Diagnosis: Acute pain of right shoulder - Plan: Ot plan of care cert/re-cert  Other lack of coordination - Plan: Ot plan of care cert/re-cert  Stiffness of right shoulder, not elsewhere classified - Plan: Ot plan of care cert/re-cert  Other symptoms and signs involving the musculoskeletal system - Plan: Ot plan of care cert/re-cert    Problem List Patient Active Problem List   Diagnosis Date Noted  . Dysphagia 07/10/2016  . Diarrhea 07/10/2016  . BMI 40.0-44.9, adult ([place]) 05/31/2015  . Neuropathy 03/17/2015  . Prostate cancer ([place]) 06/30/2013  . Abdominal aneurysm without mention of rupture 02/19/2013  . Need for prophylactic vaccination and inoculation against influenza 02/03/2013  . ANXIETY 01/14/2013  . OBESITY NOS 01/14/2013  . VITAMIN B12 DEFICIENCY 01/14/2013  . TOBACCO ABUSE 01/14/2013  . PTSD 01/14/2013  . COPD (chronic obstructive pulmonary disease) ([place])   . OSA (obstructive sleep apnea) 08/14/2012  . RLS (restless legs syndrome) 08/14/2012  . Arthritis of both knees 08/14/2012  . Prediabetes 08/14/2012  . Nephrolithiasis 08/14/2012  . Aortic atherosclerosis ([place]) 02/19/2012  . Coronary atherosclerosis of native coronary artery 03/23/2010  . CHRONIC KIDNEY DISEASE STAGE II ([facility]) 10/13/2009  . GERD 07/30/2008  . BENIGN PROSTATIC HYPERTROPHY, WITH OBSTRUCTION 02/20/2008  . Hyperlipidemia 01/31/2007  . GOUT 01/31/2007  . Essential hypertension, benign 01/31/2007    Adriona Kaney.  OT student 09/14/2017, 1:17 PM  [facility] [address] [place], [place], [address] Phone: [phone]   Fax:  [phone]  Name: PHI AVANS MRN: [id number] Date of Birth: 1945/08/01

## 2017-09-14 NOTE — Patient Instructions (Signed)
Complete each for 1-2 minutes each  SHOULDER: Flexion On Table   Place hands on table, elbows straight. Move hips away from body. Press hands down into table. Hold ___ seconds. ___ reps per set, ___ sets per day, ___ days per week  Abduction (Passive)   With arm out to side, resting on table, lower head toward arm, keeping trunk away from table. Hold ____ seconds. Repeat ____ times. Do ____ sessions per day.  Copyright  VHI. All rights reserved.     Internal Rotation (Assistive)   Seated with elbow bent at right angle and held against side, slide arm on table surface in an inward arc. Repeat ____ times. Do ____ sessions per day. Activity: Use this motion to brush crumbs off the table.  Copyright  VHI. All rights reserved.

## 2017-09-16 MED ORDER — PREDNISONE 20 MG PO TABS: ORAL_TABLET | ORAL | 0 refills | Status: DC

## 2017-09-18 ENCOUNTER — Other Ambulatory Visit: Payer: Self-pay

## 2017-09-18 ENCOUNTER — Encounter (HOSPITAL_COMMUNITY): Payer: Self-pay

## 2017-09-18 ENCOUNTER — Ambulatory Visit (HOSPITAL_COMMUNITY): Payer: Medicare Other

## 2017-09-18 DIAGNOSIS — R278 Other lack of coordination: Secondary | ICD-10-CM

## 2017-09-18 DIAGNOSIS — R29898 Other symptoms and signs involving the musculoskeletal system: Secondary | ICD-10-CM

## 2017-09-18 DIAGNOSIS — M25511 Pain in right shoulder: Secondary | ICD-10-CM | POA: Diagnosis not present

## 2017-09-18 DIAGNOSIS — M25611 Stiffness of right shoulder, not elsewhere classified: Secondary | ICD-10-CM | POA: Diagnosis not present

## 2017-09-18 NOTE — Therapy (Addendum)
[place], [place], [address] Phone: [phone]   Fax:  [phone]  Occupational Therapy Treatment  Patient Details  Name: [name] MRN: [id number] Date of Birth: 09/03/1945 Referring Provider: Dr. Victorino December   Encounter Date: 09/18/2017  OT End of Session - 09/18/17 1049    Visit Number  2    Number of Visits  24    Date for OT Re-Evaluation  12/07/17 mini reassessment due 10/12/2017    Authorization Type  Primary insurance is Medicare; secondary is BCBC; visit limit of 60 with 7 used.    Authorization Time Period  per calendar year    Authorization - Visit Number  9    Authorization - Number of Visits  50    OT Start Time  1324    OT Stop Time  1114    OT Time Calculation (min)  41 min    Activity Tolerance  Patient tolerated treatment well;Patient limited by pain    Behavior During Therapy  WFL for tasks assessed/performed       Past Medical History:  Diagnosis Date  . Aortic atherosclerosis (HCC)    Penetrating aortic ulcer s/p endograft 2004 - followed by Dr. Trula Slade  . Arthritis   . COPD (chronic obstructive pulmonary disease) ([place])   . Coronary atherosclerosis of native coronary artery    Mild atherosclerosis at catheterization 2011  . Essential hypertension, benign   . GERD (gastroesophageal reflux disease)   . Gout   . History of kidney stones   . Neuropathy   . Prostate cancer ([place])    XRT  . Sleep apnea    Intolerant of CPAP  . Tubular adenoma of colon 08/2008    Past Surgical History:  Procedure Laterality Date  . Aortic endograft repair  2004   Dr. [name]  . CHEST TUBE INSERTION  1989   "collapsed lung"due to injury  . CIRCUMCISION  09/26/2011   Procedure: CIRCUMCISION ADULT;  Surgeon: [name], MD;  [facility];  Service: Urology;  Laterality: N/A;  . COLONOSCOPY W/ POLYPECTOMY    . COLONOSCOPY WITH PROPOFOL N/A 04/27/2016   Procedure: COLONOSCOPY WITH PROPOFOL;   Surgeon: [name], MD;  [facility];  Service: Endoscopy;  Laterality: N/A;  1115-moved to 845 per Ginger  . CYSTOSCOPY  09/26/2011   Procedure: CYSTOSCOPY;  Surgeon: [name], MD;  [facility];  Service: Urology;  Laterality: N/A;  . ENDOVASCULAR STENT INSERTION  September 13, 2007   EVAR - Aortic stent graft  . ESOPHAGOGASTRODUODENOSCOPY (EGD) WITH PROPOFOL N/A 04/27/2016   Procedure: ESOPHAGOGASTRODUODENOSCOPY (EGD) WITH PROPOFOL;  Surgeon: [name], MD;  [facility];  Service: Endoscopy;  Laterality: N/A;  . EVAR    . FRACTURE SURGERY     Bilateral lower arms as a child  . KNEE ARTHROSCOPY     Left  . KNEE SURGERY  Sept. 26, 2013   Torn minisc.- Right  knee  . MALONEY DILATION N/A 04/27/2016   Procedure: Venia Minks DILATION;  Surgeon: [name], MD;  [facility];  Service: Endoscopy;  Laterality: N/A;  . PARTIAL KNEE ARTHROPLASTY Right 04/26/2015   Procedure: RIGHT KNEE MEDIAL UNICOMPARTMENTAL ARTHROPLASTY;  Surgeon: Gaynelle Arabian, MD;  [facility];  Service: Orthopedics;  Laterality: Right;  . POLYPECTOMY  04/27/2016   Procedure: POLYPECTOMY;  Surgeon: [name], MD;  [facility];  Service: Endoscopy;;  cecal , ascending, descending, and sigmoid polypectomies  . PROSTATE BIOPSY  09/26/2011   Procedure: BIOPSY TRANSRECTAL ULTRASONIC PROSTATE (TUBP);  Surgeon: [name], MD;  [facility];  Service: Urology;  Laterality: N/A;     . UMBILICAL HERNIA REPAIR      There were no vitals filed for this visit.  Subjective Assessment - 09/18/17 1044    Subjective   S: I went to the beach this weekend so I did not get a chance to do my exercises.    Currently in Pain?  Yes    Pain Score  2     Pain Location  Shoulder    Pain Orientation  Right    Pain Descriptors / Indicators  Sore    Pain Type  Acute pain    Pain Radiating Towards  N/A    Pain Onset  More than a month ago    Pain Frequency  Constant     Aggravating Factors   movement    Pain Relieving Factors  holding arm in safe position against chest    Effect of Pain on Daily Activities  max effect on ADLs    Multiple Pain Sites  No         [facility] OT Assessment - 09/18/17 1046      Assessment   Medical Diagnosis  right shoulder pain       Precautions   Precautions  Shoulder    Type of Shoulder Precautions  Begin gentle P/ROM and AA/ROM; lifting limit with no more than 2#       Observation/Other Assessments   Focus on Therapeutic Outcomes (FOTO)   27/100               OT Treatments/Exercises (OP) - 09/18/17 1046      Exercises   Exercises  Shoulder      Shoulder Exercises: Supine   Protraction  PROM;AAROM;5 reps    Horizontal ABduction  PROM;AAROM;5 reps    External Rotation  PROM;5 reps    Internal Rotation  PROM;5 reps    Flexion  PROM;AAROM;5 reps    ABduction  PROM;5 reps      Manual Therapy   Manual Therapy  Myofascial release    Manual therapy comments  Myofascial release performed seperate from other exercises.    Myofascial Release  Myofascial release performed on upper arm, trapezius, and scapularis region to reduce fascial restrictions in order to increase ROM and decrease pain.             OT Education - 09/18/17 1049    Education Details  Patient educated on the importance of completing HEP. Therapist reviewed goals with patient and provided a handout of evaluation.   Person(s) Educated  Patient    Methods  Explanation, handout   Comprehension  Verbalized understanding       OT Short Term Goals - 09/18/17 1050      OT SHORT TERM GOAL #1   Title  Patient will be educated and independent with HEP to faciliate progress in therapy and allow him to return to using his RUE as his dominant extremity for all daily tasks.    Time  6    Period  Weeks    Status  On-going      OT SHORT TERM GOAL #2   Title  Patient will increase RUE strength to 3+/5 in order to complete daily and leisure  tasks.    Time  6    Period  Weeks    Status  On-going      OT SHORT TERM GOAL #3   Title   Patient will increase RUE P/ROM to [facility] to increase ability to complete ADLs and be able to go fishing.    Time  6    Period  Weeks    Status  On-going      OT SHORT TERM GOAL #4   Title   Patient will decrease pain level in RUE during daily tasks to 5/10 or less.      Time  6    Period  Weeks    Status  On-going      OT SHORT TERM GOAL #5   Title  Patient will decrease fascial restrictions in RUE to min amount or less in order to increase functional mobility needed to complete functional reaching tasks.      Time  6    Period  Weeks    Status  On-going        OT Long Term Goals - 09/18/17 1051      OT LONG TERM GOAL #1   Title  Patient will return to highest level of independence with all daily and leisure tasks while using her RUE as his dominant.      Time  12    Period  Weeks    Status  On-going      OT LONG TERM GOAL #2   Title  Patient will increase his RUE strength to 4/5 in order to return to be able to complete all daily routine tasks.    Time  12    Period  Weeks    Status  On-going      OT LONG TERM GOAL #3   Title   Patient will increase RUE A/ROM to [facility] to increase ability to complete functional reaching tasks above head.     Time  12    Period  Weeks    Status  On-going      OT LONG TERM GOAL #4   Title  Patient will report a decrease in pain level for RUE while completing daily tasks and fishing to 3/10 or less.      Time  12    Period  Weeks    Status  On-going      OT LONG TERM GOAL #5   Title  Patient will decrease fascial restrictions to trace amount or less in RUE in order to increase functional mobility needed to complete tasks.      Time  12    Period  Weeks    Status  On-going            Plan - 09/18/17 1112    Clinical Impression Statement  A: Patient reports decreased pain since evaluation. Moderate fascial restrictions palpated in upper  arm and upper trapezius region. Therapist performed gentle P/ROM on right arm with frequent rest breaks needed due to pain. Patient is limited to approximately 110 degrees ROM for shoulder flexion and abduction and 30 degrees external rotation. Patient did not tolerate more that 5 reps of P/ROM due to pain. Patient completed gentle AA/ROM exercises for flexion, horizontal abduction, and protraction in supine demonstrating good form with ROM limitations. Patient required extra time and rest breaks due to fatigue and pain.      Occupational Profile and client history currently impacting functional performance  --    Occupational performance deficits (Please refer to evaluation for details):  --  Rehab Potential  --    Current Impairments/barriers affecting progress:  --    OT Frequency  --    OT Duration  --    OT Treatment/Interventions  --    Plan  P: Continue with manual techniques to address fascial restrictions. Hold off on AA/ROM exercises until pain decreases and add therapy ball stretches and supine isometrics.    Clinical Decision Making  --    Consulted and Agree with Plan of Care  Patient       Patient will benefit from skilled therapeutic intervention in order to improve the following deficits and impairments:  Decreased range of motion, Decreased endurance, Decreased activity tolerance, Increased fascial restrictions, Impaired UE functional use, Pain, Decreased mobility, Decreased strength  Visit Diagnosis: Acute pain of right shoulder  Other lack of coordination  Stiffness of right shoulder, not elsewhere classified  Other symptoms and signs involving the musculoskeletal system    Problem List Patient Active Problem List   Diagnosis Date Noted  . Dysphagia 07/10/2016  . Diarrhea 07/10/2016  . BMI 40.0-44.9, adult ([place]) 05/31/2015  . Neuropathy 03/17/2015  . Prostate cancer ([place]) 06/30/2013  . Abdominal aneurysm without mention of rupture 02/19/2013  . Need for  prophylactic vaccination and inoculation against influenza 02/03/2013  . ANXIETY 01/14/2013  . OBESITY NOS 01/14/2013  . VITAMIN B12 DEFICIENCY 01/14/2013  . TOBACCO ABUSE 01/14/2013  . PTSD 01/14/2013  . COPD (chronic obstructive pulmonary disease) ([place])   . OSA (obstructive sleep apnea) 08/14/2012  . RLS (restless legs syndrome) 08/14/2012  . Arthritis of both knees 08/14/2012  . Prediabetes 08/14/2012  . Nephrolithiasis 08/14/2012  . Aortic atherosclerosis ([place]) 02/19/2012  . Coronary atherosclerosis of native coronary artery 03/23/2010  . CHRONIC KIDNEY DISEASE STAGE II (MILD) 10/13/2009  . GERD 07/30/2008  . BENIGN PROSTATIC HYPERTROPHY, WITH OBSTRUCTION 02/20/2008  . Hyperlipidemia 01/31/2007  . GOUT 01/31/2007  . Essential hypertension, benign 01/31/2007    Roderic Palau, OT student 09/18/2017, 11:31 AM  [place], [place], [address] Phone: [phone]   Fax:  [phone]  Name: [name] MRN: [id number] Date of Birth: 1946/03/09

## 2017-09-20 ENCOUNTER — Ambulatory Visit (HOSPITAL_COMMUNITY): Payer: Medicare Other

## 2017-09-20 ENCOUNTER — Other Ambulatory Visit: Payer: Self-pay

## 2017-09-20 ENCOUNTER — Encounter (HOSPITAL_COMMUNITY): Payer: Self-pay

## 2017-09-20 DIAGNOSIS — M25611 Stiffness of right shoulder, not elsewhere classified: Secondary | ICD-10-CM | POA: Diagnosis not present

## 2017-09-20 DIAGNOSIS — M25511 Pain in right shoulder: Secondary | ICD-10-CM

## 2017-09-20 DIAGNOSIS — R278 Other lack of coordination: Secondary | ICD-10-CM | POA: Diagnosis not present

## 2017-09-20 DIAGNOSIS — R29898 Other symptoms and signs involving the musculoskeletal system: Secondary | ICD-10-CM

## 2017-09-20 NOTE — Therapy (Addendum)
[place], [place], [address] Phone: [phone]   Fax:  [phone]  Occupational Therapy Treatment  Patient Details  Name: [name] MRN: [id number] Date of Birth: April 20, 1945 Referring Provider: Dr. Victorino December   Encounter Date: 09/20/2017  OT End of Session - 09/20/17 1307    Visit Number  3    Number of Visits  24    Date for OT Re-Evaluation  12/07/17 mini reassessment due 10/12/2017    Authorization Type  Primary insurance is Medicare; secondary is BCBC; visit limit of 60 with 7 used.    Authorization Time Period  per calendar year    Authorization - Visit Number  10    Authorization - Number of Visits  50    OT Start Time  1301    OT Stop Time  1343    OT Time Calculation (min)  42 min    Activity Tolerance  Patient tolerated treatment well;Patient limited by pain    Behavior During Therapy  WFL for tasks assessed/performed       Past Medical History:  Diagnosis Date  . Aortic atherosclerosis (HCC)    Penetrating aortic ulcer s/p endograft 2004 - followed by Dr. Trula Slade  . Arthritis   . COPD (chronic obstructive pulmonary disease) ([place])   . Coronary atherosclerosis of native coronary artery    Mild atherosclerosis at catheterization 2011  . Essential hypertension, benign   . GERD (gastroesophageal reflux disease)   . Gout   . History of kidney stones   . Neuropathy   . Prostate cancer ([place])    XRT  . Sleep apnea    Intolerant of CPAP  . Tubular adenoma of colon 08/2008    Past Surgical History:  Procedure Laterality Date  . Aortic endograft repair  2004   Dr. [name]  . CHEST TUBE INSERTION  1989   "collapsed lung"due to injury  . CIRCUMCISION  09/26/2011   Procedure: CIRCUMCISION ADULT;  Surgeon: [name], MD;  [facility];  Service: Urology;  Laterality: N/A;  . COLONOSCOPY W/ POLYPECTOMY    . COLONOSCOPY WITH PROPOFOL N/A 04/27/2016   Procedure: COLONOSCOPY WITH  PROPOFOL;  Surgeon: [name], MD;  [facility];  Service: Endoscopy;  Laterality: N/A;  1115-moved to 845 per Ginger  . CYSTOSCOPY  09/26/2011   Procedure: CYSTOSCOPY;  Surgeon: [name], MD;  [facility];  Service: Urology;  Laterality: N/A;  . ENDOVASCULAR STENT INSERTION  September 13, 2007   EVAR - Aortic stent graft  . ESOPHAGOGASTRODUODENOSCOPY (EGD) WITH PROPOFOL N/A 04/27/2016   Procedure: ESOPHAGOGASTRODUODENOSCOPY (EGD) WITH PROPOFOL;  Surgeon: [name], MD;  [facility];  Service: Endoscopy;  Laterality: N/A;  . EVAR    . FRACTURE SURGERY     Bilateral lower arms as a child  . KNEE ARTHROSCOPY     Left  . KNEE SURGERY  Sept. 26, 2013   Torn minisc.- Right  knee  . MALONEY DILATION N/A 04/27/2016   Procedure: Venia Minks DILATION;  Surgeon: [name], MD;  [facility];  Service: Endoscopy;  Laterality: N/A;  . PARTIAL KNEE ARTHROPLASTY Right 04/26/2015   Procedure: RIGHT KNEE MEDIAL UNICOMPARTMENTAL ARTHROPLASTY;  Surgeon: Gaynelle Arabian, MD;  [facility];  Service: Orthopedics;  Laterality: Right;  . POLYPECTOMY  04/27/2016   Procedure: POLYPECTOMY;  Surgeon: [name], MD;  [facility];  Service: Endoscopy;;  cecal , ascending, descending, and sigmoid polypectomies  . PROSTATE BIOPSY  09/26/2011   Procedure: BIOPSY TRANSRECTAL ULTRASONIC PROSTATE (TUBP);  Surgeon: [name], MD;  [facility];  Service: Urology;  Laterality: N/A;     . UMBILICAL HERNIA REPAIR      There were no vitals filed for this visit.  Subjective Assessment - 09/20/17 1304    Subjective   S: I couldn't do the stretches y'all gave me because my arm hurt so bad after the last session.    Currently in Pain?  Yes    Pain Score  7     Pain Location  Shoulder    Pain Orientation  Right    Pain Descriptors / Indicators  Sore;Aching    Pain Type  Acute pain    Pain Radiating Towards  N/A    Pain Onset  More than a month ago     Pain Frequency  Constant    Aggravating Factors   movement    Pain Relieving Factors  holding arm in safe position against chest    Effect of Pain on Daily Activities  max effect on ADLs    Multiple Pain Sites  No         [facility] OT Assessment - 09/20/17 1306      Assessment   Medical Diagnosis  right shoulder pain       Precautions   Precautions  Shoulder    Type of Shoulder Precautions  Begin gentle P/ROM and AA/ROM; lifting limit with no more than 2#         OT Treatments/Exercises (OP) - 09/20/17 1307      Exercises   Exercises  Shoulder      Shoulder Exercises: Supine   Protraction  PROM;5 reps    Horizontal ABduction  PROM;5 reps    External Rotation  PROM;5 reps    Internal Rotation  PROM;5 reps    Flexion  PROM;5 reps    ABduction  PROM;5 reps      Shoulder Exercises: Seated   Other Seated Exercises  --      Shoulder Exercises: Therapy Ball   Flexion  Right;5 reps    ABduction  Right;5 reps      Shoulder Exercises: Isometric Strengthening   Flexion  3X5"    Extension  3X5"    External Rotation  3X5" strength limited    Internal Rotation  3X5"    ABduction  3X5"    ADduction  3X5"      Manual Therapy   Manual Therapy  Myofascial release    Manual therapy comments  Myofascial release performed seperate from other exercises.    Myofascial Release  Myofascial release performed on upper arm, trapezius, and scapularis region to reduce fascial restrictions in order to increase ROM and decrease pain.         OT Short Term Goals - 09/18/17 1050      OT SHORT TERM GOAL #1   Title  Patient will be educated and independent with HEP to faciliate progress in therapy and allow him to return to using his RUE as his dominant extremity for all daily tasks.    Time  6    Period  Weeks    Status  On-going      OT SHORT TERM GOAL #2   Title  Patient will increase RUE strength to 3+/5 in order to complete daily and leisure tasks.    Time  6  Period  Weeks     Status  On-going      OT SHORT TERM GOAL #3   Title   Patient will increase RUE P/ROM to [facility] to increase ability to complete ADLs and be able to go fishing.    Time  6    Period  Weeks    Status  On-going      OT SHORT TERM GOAL #4   Title   Patient will decrease pain level in RUE during daily tasks to 5/10 or less.      Time  6    Period  Weeks    Status  On-going      OT SHORT TERM GOAL #5   Title  Patient will decrease fascial restrictions in RUE to min amount or less in order to increase functional mobility needed to complete functional reaching tasks.      Time  6    Period  Weeks    Status  On-going        OT Long Term Goals - 09/18/17 1051      OT LONG TERM GOAL #1   Title  Patient will return to highest level of independence with all daily and leisure tasks while using her RUE as his dominant.      Time  12    Period  Weeks    Status  On-going      OT LONG TERM GOAL #2   Title  Patient will increase his RUE strength to 4/5 in order to return to be able to complete all daily routine tasks.    Time  12    Period  Weeks    Status  On-going      OT LONG TERM GOAL #3   Title   Patient will increase RUE A/ROM to [facility] to increase ability to complete functional reaching tasks above head.     Time  12    Period  Weeks    Status  On-going      OT LONG TERM GOAL #4   Title  Patient will report a decrease in pain level for RUE while completing daily tasks and fishing to 3/10 or less.      Time  12    Period  Weeks    Status  On-going      OT LONG TERM GOAL #5   Title  Patient will decrease fascial restrictions to trace amount or less in RUE in order to increase functional mobility needed to complete tasks.      Time  12    Period  Weeks    Status  On-going            Plan - 09/20/17 1309    Clinical Impression Statement  A: Patient reporting increased pain levels since last treatment session. He reports he was unable to do his exercises due to the pain.  Moderate fascial restrictions palpated in upper arm and upper trapezius region. Gentle P/ROM performed due to high pain levels. Patient experiences pain in anterior upper arm during decent from flexion and abduction. Patient performed therapy ball stretches with minimal VC for technique and occasional rest breaks in between reps due to pain. Isometric exercises added. Patient performed well with isometrics with occasional rest breaks due to pain. Therapist discussed the importance of completing HEP with patient again this session. Patient voiced concerns over progressing with therapy if bone is not yet in place. Therapist instructed patient to contact MD and let us  know if there are any changes to the treatment plan.    Plan  P: Continue with manual techniques to address fascial restrictions. Continue with therapy ball stretches and isometrics increasing reps as pain level allows. Attempt low level thumb tack exercise.   Consulted and Agree with Plan of Care  Patient       Patient will benefit from skilled therapeutic intervention in order to improve the following deficits and impairments:  Decreased range of motion, Decreased endurance, Decreased activity tolerance, Increased fascial restrictions, Impaired UE functional use, Pain, Decreased mobility, Decreased strength  Visit Diagnosis: Acute pain of right shoulder  Other lack of coordination  Stiffness of right shoulder, not elsewhere classified  Other symptoms and signs involving the musculoskeletal system    Problem List Patient Active Problem List   Diagnosis Date Noted  . Dysphagia 07/10/2016  . Diarrhea 07/10/2016  . BMI 40.0-44.9, adult ([place]) 05/31/2015  . Neuropathy 03/17/2015  . Prostate cancer ([place]) 06/30/2013  . Abdominal aneurysm without mention of rupture 02/19/2013  . Need for prophylactic vaccination and inoculation against influenza 02/03/2013  . ANXIETY 01/14/2013  . OBESITY NOS 01/14/2013  . VITAMIN B12 DEFICIENCY  01/14/2013  . TOBACCO ABUSE 01/14/2013  . PTSD 01/14/2013  . COPD (chronic obstructive pulmonary disease) ([place])   . OSA (obstructive sleep apnea) 08/14/2012  . RLS (restless legs syndrome) 08/14/2012  . Arthritis of both knees 08/14/2012  . Prediabetes 08/14/2012  . Nephrolithiasis 08/14/2012  . Aortic atherosclerosis ([place]) 02/19/2012  . Coronary atherosclerosis of native coronary artery 03/23/2010  . CHRONIC KIDNEY DISEASE STAGE II (MILD) 10/13/2009  . GERD 07/30/2008  . BENIGN PROSTATIC HYPERTROPHY, WITH OBSTRUCTION 02/20/2008  . Hyperlipidemia 01/31/2007  . GOUT 01/31/2007  . Essential hypertension, benign 01/31/2007    Roderic Palau, OT student 09/20/2017, 3:51 PM  [place] [address] [place], [place], [address] Phone: [phone]   Fax:  [phone]  Name: SEVERINO PAOLO MRN: [id number] Date of Birth: 07-01-1945

## 2017-09-24 ENCOUNTER — Telehealth: Payer: Self-pay | Admitting: Family Medicine

## 2017-09-24 DIAGNOSIS — N200 Calculus of kidney: Secondary | ICD-10-CM | POA: Diagnosis not present

## 2017-09-24 DIAGNOSIS — R9721 Rising PSA following treatment for malignant neoplasm of prostate: Secondary | ICD-10-CM | POA: Diagnosis not present

## 2017-09-24 DIAGNOSIS — R310 Gross hematuria: Secondary | ICD-10-CM | POA: Diagnosis not present

## 2017-09-24 DIAGNOSIS — C61 Malignant neoplasm of prostate: Secondary | ICD-10-CM | POA: Diagnosis not present

## 2017-09-26 ENCOUNTER — Ambulatory Visit: Payer: Medicare Other | Admitting: Family Medicine

## 2017-09-26 ENCOUNTER — Telehealth (HOSPITAL_COMMUNITY): Payer: Self-pay | Admitting: Family Medicine

## 2017-09-26 ENCOUNTER — Other Ambulatory Visit: Payer: Self-pay

## 2017-09-26 ENCOUNTER — Ambulatory Visit (INDEPENDENT_AMBULATORY_CARE_PROVIDER_SITE_OTHER): Payer: Medicare Other | Admitting: Family Medicine

## 2017-09-26 ENCOUNTER — Other Ambulatory Visit: Payer: Self-pay | Admitting: Urology

## 2017-09-26 ENCOUNTER — Encounter: Payer: Self-pay | Admitting: Family Medicine

## 2017-09-26 ENCOUNTER — Encounter (HOSPITAL_BASED_OUTPATIENT_CLINIC_OR_DEPARTMENT_OTHER): Payer: Self-pay | Admitting: *Deleted

## 2017-09-26 ENCOUNTER — Encounter (HOSPITAL_COMMUNITY): Payer: Medicare Other | Admitting: Occupational Therapy

## 2017-09-26 VITALS — BP 144/97 | HR 75 | Temp 98.0°F | Ht 68.0 in | Wt 273.0 lb

## 2017-09-26 DIAGNOSIS — Z01818 Encounter for other preprocedural examination: Secondary | ICD-10-CM

## 2017-09-26 NOTE — H&P (Signed)
CC/HPI: I have blood in my urine.     Lc presents with the onset this morning of gross hematuria with clots. He has no flank pain or hematuria. He has no difficulty voiding but has to go frequently. He has no fever. He has had multiple family members with kidney cancer but he has had prostate cancer that has been treated with ADT and EXRT.     CC: I have prostate cancer.  HPI: [name] is a 72 year-old male established patient who is here evaluation for treatment of prostate cancer.  His prostate cancer was diagnosed approximately 12/03/2011. He does have the pathology report from his biopsy. His most recent PSA is 0.8.   He has undergone External Beam Radiation Therapy for treatment. He has undergone Hormonal Therapy for treatment.   He does have urinary incontinence. He does have problems with erectile dysfunction. He has not recently had unwanted weight loss. He is not having pain in new locations.   [name] has a history of T3 N1M0 Gleason 8 prostate cancer with metastatic adenopathy in the right iliac chain. He was diagnosed on 09/26/11. He completed radiation therapy in late October 2015. He has been off of the ADT since 3/18 and his PSA has been slowly rising and was up to 0.8 in March with a T of 276. He has continued to have issues with tinea cruris and continues to use monistat and nystatin. He has had no further recovery of his erectile function. He broke his right shoulder about 2 months ago after another fall. His weight is down 8 lbs. He has no bone pain. He has neuropathy with numbness in the feet. He has no associated signs or symptoms.       CC: AUA Questions Scoring.  HPI:     AUA Symptom Score: Less than 50% of the time he has the sensation of not emptying his bladder completely when finished urinating. Almost always he has to urinate again fewer than two hours after he has finished urinating. He does not have to stop and start again several times when he  urinates. Almost always he finds it difficult to postpone urination. He never has a weak urinary stream. He never has to push or strain to begin urination. He has to get up to urinate 3 times from the time he goes to bed until the time he gets up in the morning.   Calculated AUA Symptom Score: 15    QOL Score: He would feel mostly dissatisfied if he had to live with his urinary condition the way it is now for the rest of his life.   Calculated QOL Symptom Score: 4    ALLERGIES: Carbidopa-Levodopa TABS Morphine Derivatives Sulfa Drugs Sulfacetamide Sodium SOLN    MEDICATIONS: Allopurinol 300 mg tablet Oral  Amitriptyline Hcl 25 mg tablet 1 tablet PO Daily  AmLODIPine Besylate 10 MG Oral Tablet Oral  Carvedilol 25 MG Oral Tablet Oral  CloNIDine HCl - 0.1 MG Oral Tablet Oral  Duloxetine Hcl 30 mg capsule,delayed release 1 capsule PO Daily  Gabapentin 100 mg capsule Oral  Hydrocodone-Acetaminophen  Losartan Potassium 50 MG Oral Tablet Oral  Miralax 17 gram/dose powder PO Daily  Nystatin 100,000 unit/gram powder External  Pantoprazole Sodium 40 MG Oral Tablet Delayed Release Oral  Spironolactone 25 MG Oral Tablet Oral  Trospium Chloride Er 60 mg capsule, ext release 24 hr 1 capsule PO Daily  Voltaren GEL Transdermal     GU PSH: Cysto Dilate Stricture (  M or F) 06-25-2011 Non-Newborn Circumcision - 06-25-2011 Prostate Needle Biopsy - 06-25-2011 Remove Kidney Stone 2011/06/25      [place] Notes: Knee Replacement, Arthroscopy Knee Right, Biopsy Of The Prostate Needle, Cystoscopy For Urethral Stricture, Circumcision No Clamp/Device/Dorsal Slit Older Than 28 Days, Chest Tube Insertion, Lithotomy, Endovascular Repair Of Aortic Aneurysm   NON-GU PSH: Insertion Of Chest Tube - 06/25/2011 Knee Arthroscopy; Dx - 06/25/2011 Revise Knee Joint 06-25-2015    GU PMH: Prostate Cancer (Worsening), His PSA continues to rise slowly. I will have him return in 6 months with a PSA for an exam. I will restage if it gets up to 5. -  06/29/2017, His PSA has been rising with the testosterone. I will recheck in 3 months. , - 03/30/2017 (Worsening), His PSA is rising with the testosterone. I will repeat that again in 3 months with a testosterone. , - 12/29/2016, - 09/29/2016 (Worsening), His PSA continues to rise but remains low. I will have him return in 3 months with a PSA and testosterone. Until the tesosterone stabilizes it will be difficult to know if this is a true recurrence. , - 06/23/2016, - 03/31/2016, - 12/31/2015 (Stable), His PSA is minimally changed and his testosterone remains castrate off of lupron and casodex. , - 10/01/2015, Prostate cancer, - June 25, 2015, Neoplasm of prostate regional lymph node staging category pN1: metastasis in regional nodes, - 06-25-14 Rising PSA after prostate cancer treatment - 06/29/2017, (Worsening), - 06/23/2016 Nocturia (Worsening), He reports increased nocturia off of the trospium so I will have him resume the trospium and use the miralax regularly. - 12/29/2016, - 09/29/2016, Nocturia, - 06-25-15 Overactive bladder - 12/29/2016, - 09/29/2016 (Stable), He is doing well on the Myrbetriq. , - 06/23/2016, - 03/31/2016, - 12/31/2015 Renal calculus - 12/31/2015, Kidney stone on right side, - 24-Jun-2012 Urge incontinence, Urge incontinence of urine - 06-25-15 Urinary Frequency, Increased urinary frequency - 06/25/15 Elevated PSA, Elevated prostate specific antigen (PSA) - 2014/06/25 Prostate nodule w/ LUTS, Nodular prostate with lower urinary tract symptoms - Jun 25, 2014 Dorsalgia, Unspec, Back pain, acute - 2013-06-24 Urinary Urgency, Urinary urgency - 24-Jun-2013 ED due to arterial insufficiency, Erectile dysfunction due to arterial insufficiency - Jun 24, 2012 History of urolithiasis, Nephrolithiasis - 2012/06/24 Personal Hx Oth male genital organs diseases, History of phimosis - Jun 24, 2012, History of balanitis, - Jun 24, 2012 Renal cyst, Renal cyst, acquired, left - 24-Jun-2012 Urethral Stricture, Unspec, Meatal stenosis - 06-24-12      PMH Notes:  2011-09-01 11:14:09 - Note: Iatrogenic  Pneumothorax  2011-10-13 11:17:30 - Note: Taking High-risk Medication  2011-09-01 10:45:02 - Note: Skin Cancer   NON-GU PMH: Tinea cruris, Tinea cruris - 2015-06-25 Candidiasis of skin and nail, Candidal intertrigo - Jun 25, 2014 Personal history of other diseases of the nervous system and sense organs, History of neuropathy - 06-25-14, History of sleep apnea, - 2012/06/24 Encounter for general adult medical examination without abnormal findings, Encounter for preventive health examination - June 24, 2013 Anxiety, Anxiety - 06-24-12 Gastric ulcer, unspecified as acute or chronic, without hemorrhage or perforation, Gastric Ulcer - June 24, 2012 Gout, Gout - 06-24-12 Insomnia, unspecified, Insomnia - 06-24-12 Personal history of other diseases of the circulatory system, History of hypertension - 24-Jun-2012 Personal history of other diseases of the digestive system, History of esophageal reflux - June 24, 2012 Personal history of other endocrine, nutritional and metabolic disease, History of hypercholesterolemia - 2012/06/24 Solitary pulmonary nodule, Solitary pulmonary nodule - 24-Jun-2012    FAMILY HISTORY: Death In The Family Father - Runs In Family Death In The Family  Mother - Runs In [place] Status Number - Runs In Family Kidney Cancer - Brother, Father, Brother   SOCIAL HISTORY: Marital Status: Married Preferred Language: English; Race: White Current Smoking Status: Patient smokes.   Tobacco Use Assessment Completed: Used Tobacco in last 30 days? Drinks 3 caffeinated drinks per day.     Notes: Tobacco use, Occupation:, Marital History - Currently Married, Caffeine Use, Alcohol Use   REVIEW OF SYSTEMS:    GU Review Male:   hematuria. Patient reports frequent urination and get up at night to urinate. Patient denies hard to postpone urination, burning/ pain with urination, leakage of urine, stream starts and stops, trouble starting your stream, have to strain to urinate , erection problems, and penile pain.  Gastrointestinal (Upper):   Patient denies  nausea, vomiting, and indigestion/ heartburn.  Gastrointestinal (Lower):   Patient denies diarrhea and constipation.  Constitutional:   Patient denies fever, night sweats, weight loss, and fatigue.  Skin:   Patient denies skin rash/ lesion and itching.  Eyes:   Patient denies blurred vision and double vision.  Ears/ Nose/ Throat:   Patient denies sore throat and sinus problems.  Hematologic/Lymphatic:   Patient denies swollen glands and easy bruising.  Cardiovascular:   Patient denies leg swelling and chest pains.  Respiratory:   Patient denies cough and shortness of breath.  Endocrine:   Patient denies excessive thirst.  Musculoskeletal:   Patient denies back pain and joint pain.  Neurological:   Patient denies headaches and dizziness.  Psychologic:   Patient denies depression and anxiety.   VITAL SIGNS:      09/24/2017 12:31 PM  Weight 272 lb / 123.38 kg  Height 68 in / 172.72 cm  BP 153/93 mmHg  Heart Rate 69 /min  Temperature 98.0 F / 36.6 C  BMI 41.4 kg/m   MULTI-SYSTEM PHYSICAL EXAMINATION:    Constitutional: Obese. No physical deformities. Normally developed. Good grooming.   Neck: Neck symmetrical, not swollen. Normal tracheal position.  Lymphatic: No enlargement, no tenderness of axillae, supraclavicular or neck lymph nodes.  Gastrointestinal: Obese abdomen. No mass, no tenderness, no rigidity.      PAST DATA REVIEWED:  Source Of History:  Patient  Records Review:   AUA Symptom Score  Urine Test Review:   Urinalysis  X-Ray Review: C.T. Hematuria: Reviewed Films. Discussed With Patient.     06/25/17 03/23/17 12/25/16 09/26/16 06/19/16 03/28/16 12/20/15 09/27/15  PSA  Total PSA 0.8 ng/dl 0.7 ng/dl 0.6 ng/dl 0.3 ng/dl 0.3 ng/dl 0.2  0.1 ng/dl 0.02 ng/dl    06/25/17 03/23/17 09/26/16 03/28/16 12/20/15 09/27/15 09/27/15 06/12/15  Hormones  Testosterone, Total 276 pg/dL 298 pg/dL 228 pg/dL 54 pg/dL 28 pg/dL 27 pg/dL 27 pg/dL 33     PROCEDURES:         Flexible  Cystoscopy - 52000  Risks, benefits, and some of the potential complications of the procedure were discussed. 61ml of 2% lidocaine jelly was instilled intraurethrally.  Cipro 500mg  given for antibiotic prophylaxis.     Meatus:  Normal size. Normal location. Normal condition.  Urethra:  No strictures.  External Sphincter:  Normal.  Verumontanum:  Normal.  Prostate:  Borderline obstructing. Moderate hyperplasia.  Bladder Neck:  Non-obstructing.  Ureteral Orifices:  Bloody urine from left orifice. Normal location. Normal size. Normal shape.  Bladder:  Mild trabeculation. No tumors. Normal mucosa. No stones. 166ml of bloody urine aspirated from the bladder to be able to see.       The procedure  was well tolerated and there were no complications.         C.T. Hematuria - 74178, [id number]  bilateral simple cysts are noted with no renal masses. There are no stones or renal collecting system filling defects or hydro. The bladder wall is thickened. There are fiducial markers in the prostate. He has an aortoiliac graft.       102ml [id number]   ASSESSMENT:      ICD-10 Details  1 GU:   Gross hematuria - R31.0 He is bleeding from the left UO. There are no stones or obvious lesions other than cysts in the left kidney but the LUP collecting system doesn't completely fill. I will send a cytology and get him set up for a cystoscopy with Left RTG, left ureteroscopy with biopsy and fulguration. I reviewed the risks.   2   Prostate Cancer - C61 His PSA has been slowly rising and I will repeat that today.  3   Rising PSA after prostate cancer treatment - R97.21 Worsening   PLAN:           Orders Labs PSA with Reflex, BUN/Creatinine(Stat), Urine Cytology  X-Rays: C.T. Hematuria With and Without I.V. Contrast  X-Ray Notes: History:  Hematuria: Yes/No  Patient to see MD after exam: Yes/No  Previous exam: CT / IVP/ US/ KUB/ None  When:  Where:  Diabetic: Yes/ No  BUN/ Creatinine:  Date of last  BUN Creatinine:  Weight in pounds:  Allergy- IV Contrast: Yes/ No  Conflicting diabetic meds: Yes/ No  Diabetic Meds:  Prior Authorization #: NA          Schedule Return Visit/Planned Activity: ASAP - Schedule Surgery             Note: cystoscopy with left ureteroscopy          Document Letter(s):  Created for Patient: Clinical Summary         Notes:   CC: Dr. Weston Settle.        Next Appointment:      Next Appointment: 12/28/2017 09:45 AM    Appointment Type: Office Visit Established Patient    Location: [facility] - [address]    Provider: [facility]    Reason for Visit: 6 mo f/u with labs

## 2017-09-26 NOTE — Progress Notes (Signed)
BP (!) 144/97   Pulse 75   Temp 98 F (36.7 C) (Oral)   Ht 5' 8" (1.727 m)   Wt 273 lb (123.8 kg)   SpO2 96%   BMI 41.51 kg/m    Subjective:    Patient ID: [name], male    DOB: Jun 27, 1945, 72 y.o.   MRN: [id number]  HPI: [name] is a 72 y.o. male presenting on 09/26/2017 for Has exploratory surgery tomorrow with urologist (episode of excessive hematuria 2 days ago, saw urologist same day)   HPI Hematuria and presurgical clearance Patient is coming in today with hematuria and wants a presurgical clearance for his urologist.  He is going to have a procedure with his urologist tomorrow where it sounds like they are going to do some renal stent or renal biopsy to try and figure out why he is having hematuria.  His blood pressure is slightly elevated today but patient tends to have white coat syndrome and it runs better at home per patient.  His blood pressure today is 144/97.  He is currently taking his amlodipine and carvedilol and losartan regularly.  He denies any chest pain or palpitations or shortness of breath or wheezing today. Patient denies any chest pain, shortness of breath, headaches or vision issues, abdominal complaints, diarrhea, nausea, vomiting, or joint issues.   Relevant past medical, surgical, family and social history reviewed and updated as indicated. Interim medical history since our last visit reviewed. Allergies and medications reviewed and updated.  Review of Systems  Constitutional: Negative for chills and fever.  Eyes: Negative for discharge.  Respiratory: Negative for shortness of breath and wheezing.   Cardiovascular: Negative for chest pain and leg swelling.  Genitourinary: Positive for hematuria. Negative for dysuria and frequency.  Musculoskeletal: Negative for back pain and gait problem.  Skin: Negative for rash.  All other systems reviewed and are negative.   Per HPI unless specifically indicated above   Allergies as of  09/26/2017      Reactions   Carbidopa-levodopa Other (See Comments)   hallunications   Morphine Sulfate Nausea And Vomiting   Sulfacetamide Sodium Nausea And Vomiting      Medication List        Accurate as of 09/26/17  4:51 PM. Always use your most recent med list.          allopurinol 300 MG tablet Commonly known as:  ZYLOPRIM TAKE 1 TABLET BY MOUTH ONCE DAILY   amLODipine 10 MG tablet Commonly known as:  NORVASC TAKE 1 TABLET BY MOUTH ONCE DAILY   carvedilol 25 MG tablet Commonly known as:  COREG Take 1 tablet (25 mg total) by mouth 2 (two) times daily.   DULoxetine 30 MG capsule Commonly known as:  CYMBALTA TAKE 1 CAPSULE BY MOUTH ONCE DAILY   losartan 50 MG tablet Commonly known as:  COZAAR TAKE 1 TABLET BY MOUTH ONCE DAILY   pantoprazole 40 MG tablet Commonly known as:  PROTONIX TAKE 1 TABLET BY MOUTH TWICE DAILY          Objective:    BP (!) 144/97   Pulse 75   Temp 98 F (36.7 C) (Oral)   Ht 5' 8" (1.727 m)   Wt 273 lb (123.8 kg)   SpO2 96%   BMI 41.51 kg/m   Wt Readings from Last 3 Encounters:  09/26/17 272 lb (123.4 kg)  09/26/17 273 lb (123.8 kg)  09/12/17 274 lb (124.3 kg)    Physical  Exam  Constitutional: He is oriented to person, place, and time. He appears well-developed and well-nourished. No distress.  Eyes: Conjunctivae are normal. No scleral icterus.  Neck: Neck supple. No thyromegaly present.  Cardiovascular: Normal rate, regular rhythm, normal heart sounds and intact distal pulses.  No murmur heard. Pulmonary/Chest: Effort normal and breath sounds normal. No respiratory distress. He has no wheezes.  Lymphadenopathy:    He has no cervical adenopathy.  Neurological: He is alert and oriented to person, place, and time. Coordination normal.  Skin: Skin is warm and dry. No rash noted. He is not diaphoretic.  Psychiatric: He has a normal mood and affect. His behavior is normal.  Nursing note and vitals reviewed.  EKG: First-degree  AV block, normal sinus rhythm, no changes from previous EKG that was performed in October 2018    Assessment & Plan:   Problem List Items Addressed This Visit    None    Visit Diagnoses    Encounter for preoperative examination for general surgical procedure    -  Primary   Relevant Orders   CMP14+EGFR   CBC with Differential/Platelet   EKG 12-Lead (Completed)      BP elevated but breathing much better than our previous exams, saturation 96%.  No changes on EKG, cleared for surgical procedure with moderate risk, please forward results to his urologist Dr. [name]  Follow up plan: Return if symptoms worsen or fail to improve.  Counseling provided for all of the vaccine components Orders Placed This Encounter  Procedures  . CMP14+EGFR  . CBC with Differential/Platelet  . EKG 12-Lead    Caryl Pina, MD [facility] 09/26/2017, 4:51 PM

## 2017-09-26 NOTE — Progress Notes (Signed)
Spoke w/ pt via phone for pre-op interview.  Npo after mn.  Arrive at 0930.  Needs istat.  Current ekg and cxr in chart and epic.  Will take coreg and protonix am dos w/ sips fo water and if needed take oxycodone.

## 2017-09-26 NOTE — Telephone Encounter (Signed)
09/26/17  I called to confirm that he did want to cx and he said he was waiting to hear frm surgeon about pending surgery... he said he would call us back to let us know what the surgeon told him and to put therapy on hold for now

## 2017-09-26 NOTE — Telephone Encounter (Signed)
The last time the patient was seen he was in COPD exacerbation, I would need to see him to evaluate to make sure he is doing okay now.  He needs a visit prior

## 2017-09-26 NOTE — Telephone Encounter (Signed)
Pt scheduled appt today with [name].

## 2017-09-27 ENCOUNTER — Ambulatory Visit (HOSPITAL_BASED_OUTPATIENT_CLINIC_OR_DEPARTMENT_OTHER): Payer: Medicare Other | Admitting: Anesthesiology

## 2017-09-27 ENCOUNTER — Ambulatory Visit (HOSPITAL_BASED_OUTPATIENT_CLINIC_OR_DEPARTMENT_OTHER)
Admission: RE | Admit: 2017-09-27 | Discharge: 2017-09-27 | Disposition: A | Discharge status: Home / Self Care | Payer: Medicare Other | Source: Ambulatory Visit | Attending: Urology | Admitting: Urology

## 2017-09-27 ENCOUNTER — Encounter (HOSPITAL_BASED_OUTPATIENT_CLINIC_OR_DEPARTMENT_OTHER): Payer: Self-pay | Admitting: *Deleted

## 2017-09-27 ENCOUNTER — Encounter (HOSPITAL_COMMUNITY): Payer: Medicare Other

## 2017-09-27 ENCOUNTER — Encounter (HOSPITAL_BASED_OUTPATIENT_CLINIC_OR_DEPARTMENT_OTHER)
Admission: RE | Disposition: A | Discharge status: Home / Self Care | Payer: Self-pay | Source: Ambulatory Visit | Attending: Urology

## 2017-09-27 DIAGNOSIS — Z923 Personal history of irradiation: Secondary | ICD-10-CM | POA: Insufficient documentation

## 2017-09-27 DIAGNOSIS — Z888 Allergy status to other drugs, medicaments and biological substances status: Secondary | ICD-10-CM | POA: Diagnosis not present

## 2017-09-27 DIAGNOSIS — B356 Tinea cruris: Secondary | ICD-10-CM | POA: Diagnosis not present

## 2017-09-27 DIAGNOSIS — Z8546 Personal history of malignant neoplasm of prostate: Secondary | ICD-10-CM | POA: Insufficient documentation

## 2017-09-27 DIAGNOSIS — Z885 Allergy status to narcotic agent status: Secondary | ICD-10-CM | POA: Insufficient documentation

## 2017-09-27 DIAGNOSIS — N281 Cyst of kidney, acquired: Secondary | ICD-10-CM | POA: Diagnosis not present

## 2017-09-27 DIAGNOSIS — Z882 Allergy status to sulfonamides status: Secondary | ICD-10-CM | POA: Insufficient documentation

## 2017-09-27 DIAGNOSIS — G4730 Sleep apnea, unspecified: Secondary | ICD-10-CM | POA: Insufficient documentation

## 2017-09-27 DIAGNOSIS — F17200 Nicotine dependence, unspecified, uncomplicated: Secondary | ICD-10-CM | POA: Insufficient documentation

## 2017-09-27 DIAGNOSIS — K219 Gastro-esophageal reflux disease without esophagitis: Secondary | ICD-10-CM | POA: Diagnosis not present

## 2017-09-27 DIAGNOSIS — I129 Hypertensive chronic kidney disease with stage 1 through stage 4 chronic kidney disease, or unspecified chronic kidney disease: Secondary | ICD-10-CM | POA: Diagnosis not present

## 2017-09-27 DIAGNOSIS — I2510 Atherosclerotic heart disease of native coronary artery without angina pectoris: Secondary | ICD-10-CM | POA: Insufficient documentation

## 2017-09-27 DIAGNOSIS — M1990 Unspecified osteoarthritis, unspecified site: Secondary | ICD-10-CM | POA: Insufficient documentation

## 2017-09-27 DIAGNOSIS — Z6841 Body Mass Index (BMI) 40.0 and over, adult: Secondary | ICD-10-CM | POA: Insufficient documentation

## 2017-09-27 DIAGNOSIS — I10 Essential (primary) hypertension: Secondary | ICD-10-CM | POA: Diagnosis not present

## 2017-09-27 DIAGNOSIS — J449 Chronic obstructive pulmonary disease, unspecified: Secondary | ICD-10-CM | POA: Diagnosis not present

## 2017-09-27 DIAGNOSIS — R310 Gross hematuria: Secondary | ICD-10-CM | POA: Diagnosis not present

## 2017-09-27 DIAGNOSIS — E785 Hyperlipidemia, unspecified: Secondary | ICD-10-CM | POA: Diagnosis not present

## 2017-09-27 DIAGNOSIS — I739 Peripheral vascular disease, unspecified: Secondary | ICD-10-CM | POA: Diagnosis not present

## 2017-09-27 DIAGNOSIS — Z79899 Other long term (current) drug therapy: Secondary | ICD-10-CM | POA: Insufficient documentation

## 2017-09-27 DIAGNOSIS — N182 Chronic kidney disease, stage 2 (mild): Secondary | ICD-10-CM | POA: Diagnosis not present

## 2017-09-27 HISTORY — DX: Chronic kidney disease, stage 3 unspecified: N18.30

## 2017-09-27 HISTORY — DX: Cardiac murmur, unspecified: R01.1

## 2017-09-27 HISTORY — DX: Restless legs syndrome: G25.81

## 2017-09-27 HISTORY — DX: Obstructive sleep apnea (adult) (pediatric): G47.33

## 2017-09-27 HISTORY — DX: Personal history of adenomatous and serrated colon polyps: Z86.0101

## 2017-09-27 HISTORY — DX: Emphysema, unspecified: J43.9

## 2017-09-27 HISTORY — DX: Personal history of irradiation: Z92.3

## 2017-09-27 HISTORY — DX: Chronic kidney disease, stage 3 (moderate): N18.3

## 2017-09-27 HISTORY — DX: Overactive bladder: N32.81

## 2017-09-27 HISTORY — DX: Atrioventricular block, first degree: I44.0

## 2017-09-27 HISTORY — DX: Male erectile dysfunction, unspecified: N52.9

## 2017-09-27 HISTORY — DX: Postprocedural hemorrhage of a genitourinary system organ or structure following a genitourinary system procedure: N99.820

## 2017-09-27 HISTORY — DX: Personal history of other diseases of the digestive system: Z87.19

## 2017-09-27 HISTORY — DX: Personal history of other diseases of the respiratory system: Z87.09

## 2017-09-27 HISTORY — DX: Frequency of micturition: R35.0

## 2017-09-27 HISTORY — PX: CYSTOSCOPY/RETROGRADE/URETEROSCOPY: SHX5316

## 2017-09-27 HISTORY — DX: Other displaced fracture of upper end of unspecified humerus, initial encounter for closed fracture: S42.293A

## 2017-09-27 HISTORY — DX: Personal history of colonic polyps: Z86.010

## 2017-09-27 HISTORY — DX: Personal history of (healed) traumatic fracture: Z87.81

## 2017-09-27 HISTORY — DX: Urge incontinence: N39.41

## 2017-09-27 HISTORY — DX: Personal history of traumatic brain injury: Z87.820

## 2017-09-27 LAB — POCT I-STAT 4, (NA,K, GLUC, HGB,HCT)
Glucose, Bld: 105 mg/dL — ABNORMAL HIGH (ref 70–99)
HCT: 38 % — ABNORMAL LOW (ref 39.0–52.0)
Hemoglobin: 12.9 g/dL — ABNORMAL LOW (ref 13.0–17.0)
Potassium: 3.4 mmol/L — ABNORMAL LOW (ref 3.5–5.1)
Sodium: 142 mmol/L (ref 135–145)

## 2017-09-27 SURGERY — CYSTOSCOPY/RETROGRADE/URETEROSCOPY
Anesthesia: General | Site: Renal | Laterality: Left

## 2017-09-27 MED ORDER — IOHEXOL 300 MG/ML  SOLN
INTRAMUSCULAR | Status: DC | PRN
  Administered 2017-09-27: 7 mL

## 2017-09-27 MED ORDER — DEXTROSE 5 % IV SOLN
3.0000 g | INTRAVENOUS | Status: AC
  Administered 2017-09-27: 3 g via INTRAVENOUS
  Filled 2017-09-27: qty 3

## 2017-09-27 MED ORDER — ACETAMINOPHEN 325 MG PO TABS
650.0000 mg | ORAL_TABLET | ORAL | Status: DC | PRN
  Filled 2017-09-27: qty 2

## 2017-09-27 MED ORDER — GLYCOPYRROLATE PF 0.2 MG/ML IJ SOSY
PREFILLED_SYRINGE | INTRAMUSCULAR | Status: AC
  Filled 2017-09-27: qty 1

## 2017-09-27 MED ORDER — LIDOCAINE 2% (20 MG/ML) 5 ML SYRINGE
INTRAMUSCULAR | Status: DC | PRN
  Administered 2017-09-27: 100 mg via INTRAVENOUS

## 2017-09-27 MED ORDER — EPHEDRINE SULFATE-NACL 50-0.9 MG/10ML-% IV SOSY
PREFILLED_SYRINGE | INTRAVENOUS | Status: DC | PRN
  Administered 2017-09-27 (×2): 10 mg via INTRAVENOUS

## 2017-09-27 MED ORDER — ONDANSETRON HCL 4 MG/2ML IJ SOLN
4.0000 mg | Freq: Once | INTRAMUSCULAR | Status: DC | PRN
  Filled 2017-09-27: qty 2

## 2017-09-27 MED ORDER — FENTANYL CITRATE (PF) 100 MCG/2ML IJ SOLN
25.0000 ug | INTRAMUSCULAR | Status: DC | PRN
  Filled 2017-09-27: qty 1

## 2017-09-27 MED ORDER — PROPOFOL 10 MG/ML IV BOLUS
INTRAVENOUS | Status: DC | PRN
  Administered 2017-09-27: 200 mg via INTRAVENOUS

## 2017-09-27 MED ORDER — SODIUM CHLORIDE 0.9% FLUSH
3.0000 mL | INTRAVENOUS | Status: DC | PRN
  Filled 2017-09-27: qty 3

## 2017-09-27 MED ORDER — FENTANYL CITRATE (PF) 100 MCG/2ML IJ SOLN
INTRAMUSCULAR | Status: DC | PRN
  Administered 2017-09-27: 100 ug via INTRAVENOUS

## 2017-09-27 MED ORDER — FENTANYL CITRATE (PF) 100 MCG/2ML IJ SOLN
INTRAMUSCULAR | Status: AC
Start: 2017-09-27 — End: ?
  Filled 2017-09-27: qty 2

## 2017-09-27 MED ORDER — SODIUM CHLORIDE 0.9 % IV SOLN
INTRAVENOUS | Status: DC
  Administered 2017-09-27: 10:00:00 via INTRAVENOUS
  Filled 2017-09-27: qty 1000

## 2017-09-27 MED ORDER — LIDOCAINE 2% (20 MG/ML) 5 ML SYRINGE
INTRAMUSCULAR | Status: AC
  Filled 2017-09-27: qty 5

## 2017-09-27 MED ORDER — FENTANYL CITRATE (PF) 100 MCG/2ML IJ SOLN
INTRAMUSCULAR | Status: AC
  Filled 2017-09-27: qty 2

## 2017-09-27 MED ORDER — SODIUM CHLORIDE 0.9% FLUSH
3.0000 mL | Freq: Two times a day (BID) | INTRAVENOUS | Status: DC
  Filled 2017-09-27: qty 3

## 2017-09-27 MED ORDER — SODIUM CHLORIDE 0.9 % IV SOLN
250.0000 mL | INTRAVENOUS | Status: DC | PRN
  Filled 2017-09-27: qty 250

## 2017-09-27 MED ORDER — GLYCOPYRROLATE PF 0.2 MG/ML IJ SOSY
PREFILLED_SYRINGE | INTRAMUSCULAR | Status: DC | PRN
  Administered 2017-09-27: .2 mg via INTRAVENOUS

## 2017-09-27 MED ORDER — DEXAMETHASONE SODIUM PHOSPHATE 10 MG/ML IJ SOLN
INTRAMUSCULAR | Status: AC
  Filled 2017-09-27: qty 1

## 2017-09-27 MED ORDER — OXYCODONE HCL 5 MG PO TABS
5.0000 mg | ORAL_TABLET | ORAL | Status: DC | PRN
  Filled 2017-09-27: qty 2

## 2017-09-27 MED ORDER — CEFAZOLIN SODIUM-DEXTROSE 2-4 GM/100ML-% IV SOLN
INTRAVENOUS | Status: AC
  Filled 2017-09-27: qty 100

## 2017-09-27 MED ORDER — HYDROCODONE-ACETAMINOPHEN 5-325 MG PO TABS: 1.0000 | ORAL_TABLET | Freq: Four times a day (QID) | ORAL | 0 refills | Status: DC | PRN

## 2017-09-27 MED ORDER — CEFAZOLIN SODIUM-DEXTROSE 1-4 GM/50ML-% IV SOLN
INTRAVENOUS | Status: AC
  Filled 2017-09-27: qty 50

## 2017-09-27 MED ORDER — ONDANSETRON HCL 4 MG/2ML IJ SOLN
INTRAMUSCULAR | Status: AC
  Filled 2017-09-27: qty 2

## 2017-09-27 MED ORDER — PROPOFOL 10 MG/ML IV BOLUS
INTRAVENOUS | Status: AC
  Filled 2017-09-27: qty 40

## 2017-09-27 MED ORDER — DEXAMETHASONE SODIUM PHOSPHATE 10 MG/ML IJ SOLN
INTRAMUSCULAR | Status: DC | PRN
  Administered 2017-09-27: 10 mg via INTRAVENOUS

## 2017-09-27 MED ORDER — ACETAMINOPHEN 650 MG RE SUPP
650.0000 mg | RECTAL | Status: DC | PRN
  Filled 2017-09-27: qty 1

## 2017-09-27 MED ORDER — SODIUM CHLORIDE 0.9 % IR SOLN
Status: DC | PRN
  Administered 2017-09-27: 3000 mL

## 2017-09-27 SURGICAL SUPPLY — 34 items
BAG DRAIN URO-CYSTO SKYTR STRL (DRAIN) ×3 IMPLANT
BAG DRN UROCATH (DRAIN) ×2
CATH FOLEY 2WAY SLVR  5CC 16FR (CATHETERS)
CATH FOLEY 2WAY SLVR 5CC 16FR (CATHETERS) IMPLANT
CATH URET 5FR 28IN CONE TIP (BALLOONS)
CATH URET 5FR 28IN OPEN ENDED (CATHETERS) ×2 IMPLANT
CATH URET 5FR 70CM CONE TIP (BALLOONS) IMPLANT
CLOTH BEACON ORANGE TIMEOUT ST (SAFETY) ×3 IMPLANT
ELECT REM PT RETURN 9FT ADLT (ELECTROSURGICAL) ×3
ELECTRODE REM PT RTRN 9FT ADLT (ELECTROSURGICAL) ×2 IMPLANT
FIBER LASER FLEXIVA 365 (UROLOGICAL SUPPLIES) IMPLANT
FIBER LASER TRAC TIP (UROLOGICAL SUPPLIES) IMPLANT
GLOVE SURG SS PI 8.0 STRL IVOR (GLOVE) ×3 IMPLANT
GOWN STRL REUS W/ TWL LRG LVL3 (GOWN DISPOSABLE) ×2 IMPLANT
GOWN STRL REUS W/ TWL XL LVL3 (GOWN DISPOSABLE) ×2 IMPLANT
GOWN STRL REUS W/TWL LRG LVL3 (GOWN DISPOSABLE) ×3
GOWN STRL REUS W/TWL XL LVL3 (GOWN DISPOSABLE) ×6 IMPLANT
GUIDEWIRE 0.038 PTFE COATED (WIRE) IMPLANT
GUIDEWIRE ANG ZIPWIRE 038X150 (WIRE) IMPLANT
GUIDEWIRE STR DUAL SENSOR (WIRE) ×3 IMPLANT
INFUSOR MANOMETER BAG 3000ML (MISCELLANEOUS) ×1 IMPLANT
IV NS IRRIG 3000ML ARTHROMATIC (IV SOLUTION) ×3 IMPLANT
KIT TURNOVER CYSTO (KITS) ×3 IMPLANT
MANIFOLD NEPTUNE II (INSTRUMENTS) ×3 IMPLANT
NDL SAFETY ECLIPSE 18X1.5 (NEEDLE) IMPLANT
NEEDLE HYPO 18GX1.5 SHARP (NEEDLE)
NEEDLE HYPO 22GX1.5 SAFETY (NEEDLE) IMPLANT
NS IRRIG 500ML POUR BTL (IV SOLUTION) ×3 IMPLANT
PACK CYSTO (CUSTOM PROCEDURE TRAY) ×3 IMPLANT
SHEATH URETERAL 12FRX35CM (MISCELLANEOUS) ×2 IMPLANT
SYR 20CC LL (SYRINGE) IMPLANT
TUBE CONNECTING 12X1/4 (SUCTIONS) ×2 IMPLANT
TUBING UROLOGY SET (TUBING) IMPLANT
WATER STERILE IRR 3000ML UROMA (IV SOLUTION) ×1 IMPLANT

## 2017-09-27 NOTE — Anesthesia Preprocedure Evaluation (Addendum)
Anesthesia Evaluation  Patient identified by MRN, date of birth, ID band Patient awake    Reviewed: Allergy & Precautions, NPO status , Patient's Chart, lab work & pertinent test results  Airway Mallampati: II  TM Distance: >3 FB Neck ROM: Full    Dental no notable dental hx. (+) Dental Advisory Given, Caps,    Pulmonary sleep apnea , COPD,  COPD inhaler, Current Smoker,    Pulmonary exam normal breath sounds clear to auscultation       Cardiovascular hypertension, Pt. on medications and Pt. on home beta blockers + CAD and + Peripheral Vascular Disease  Normal cardiovascular exam+ dysrhythmias  Rhythm:Regular Rate:Normal  ECG: SR, 1st degree AV block. Rate 71   Neuro/Psych PSYCHIATRIC DISORDERS Anxiety negative neurological ROS     GI/Hepatic Neg liver ROS, GERD  Medicated and Controlled,  Endo/Other  Morbid obesity  Renal/GU Renal disease     Musculoskeletal  (+) Arthritis , Osteoarthritis,  Gout   Abdominal (+) + obese,   Peds  Hematology  (+) anemia ,   Anesthesia Other Findings LEFT URETERAL BLEEDING  Reproductive/Obstetrics                           Anesthesia Physical Anesthesia Plan  ASA: III  Anesthesia Plan: General   Post-op Pain Management:    Induction: Intravenous  PONV Risk Score and Plan: 1 and Ondansetron  Airway Management Planned: LMA  Additional Equipment:   Intra-op Plan:   Post-operative Plan: Extubation in OR  Informed Consent: I have reviewed the patients History and Physical, chart, labs and discussed the procedure including the risks, benefits and alternatives for the proposed anesthesia with the patient or authorized representative who has indicated his/her understanding and acceptance.   Dental advisory given  Plan Discussed with: CRNA  Anesthesia Plan Comments:        Anesthesia Quick Evaluation

## 2017-09-27 NOTE — Transfer of Care (Signed)
Immediate Anesthesia Transfer of Care Note  Patient: [name]  Procedure(s) Performed: Phoebe Sharps AND RENAL WASHINGS (Left Renal)  Patient Location: PACU  Anesthesia Type:General  Level of Consciousness: awake, sedated and patient cooperative  Airway & Oxygen Therapy: Patient Spontanous Breathing  Post-op Assessment: Report given to RN and Post -op Vital signs reviewed and stable  Post vital signs: Reviewed and stable  Last Vitals:  Vitals Value Taken Time  BP 140/70 09/27/2017 12:53 PM  Temp    Pulse 65 09/27/2017 12:55 PM  Resp 20 09/27/2017 12:55 PM  SpO2 92 % 09/27/2017 12:55 PM  Vitals shown include unvalidated device data.  Last Pain:  Vitals:   09/27/17 0853  TempSrc: Oral         Complications: No apparent anesthesia complications

## 2017-09-27 NOTE — Anesthesia Procedure Notes (Signed)
Procedure Name: LMA Insertion Date/Time: 09/27/2017 12:11 PM Performed by: Lyndee Leo, CRNA Pre-anesthesia Checklist: Patient identified, Emergency Drugs available, Suction available and Patient being monitored Patient Re-evaluated:Patient Re-evaluated prior to induction Oxygen Delivery Method: Circle system utilized Preoxygenation: Pre-oxygenation with 100% oxygen Induction Type: IV induction Ventilation: Mask ventilation without difficulty LMA: LMA inserted LMA Size: 4.0 Number of attempts: 1 Airway Equipment and Method: Bite block Placement Confirmation: positive ETCO2 Tube secured with: Tape Dental Injury: Teeth and Oropharynx as per pre-operative assessment

## 2017-09-27 NOTE — Interval H&P Note (Signed)
History and Physical Interval Note:  The bleeding has stopped but he still needs evaluation.  There were tiny stones on the CT.  He has been cleared for surgery by Dr. Warrick Parisian.   09/27/2017 9:40 AM  [name]  has presented today for surgery, with the diagnosis of LEFT URETERAL BLEEDING  The various methods of treatment have been discussed with the patient and family. After consideration of risks, benefits and other options for treatment, the patient has consented to  Procedure(s): CYSTOSCOPYLEFT /RETROGRADE/URETEROSCOPY (Left) CYSTOSCOPY WITH POSSIBLE BIOPSY AND FULGURATION (N/A) as a surgical intervention .  The patient's history has been reviewed, patient examined, no change in status, stable for surgery.  I have reviewed the patient's chart and labs.  Questions were answered to the patient's satisfaction.     [name]

## 2017-09-27 NOTE — Discharge Instructions (Addendum)
CYSTOSCOPY HOME CARE INSTRUCTIONS  Activity: Rest for the remainder of the day.  Do not drive or operate equipment today.  You may resume normal activities in one to two days as instructed by your physician.   Meals: Drink plenty of liquids and eat light foods such as gelatin or soup this evening.  You may return to a normal meal plan tomorrow.  Return to Work: You may return to work in one to two days or as instructed by your physician.  Special Instructions / Symptoms: Call your physician if any of these symptoms occur:   -persistent or heavy bleeding  -bleeding which continues after first few urination  -large blood clots that are difficult to pass  -urine stream diminishes or stops completely  -fever equal to or higher than 101 degrees Farenheit.  -cloudy urine with a strong, foul odor  -severe pain  I didn't leave a stent.   You may have some pain on the left side from the procedure but that should be transient.

## 2017-09-27 NOTE — Op Note (Addendum)
Procedure: 1.  Cystoscopy with left retrograde pyelogram and interpretation. 2.  Left diagnostic ureteroscopy with renal washings.  Preop diagnosis: Gross hematuria localized to the left collecting system.   Postop diagnosis: Same with Randall's plaques.  Surgeon: Dr. [name].  Anesthesia: General.  Specimen: Left renal washings.  Drain: None.  EBL: None.  Complications: None.  Indications: [name] is a 72 year old white male with a history of prostate cancer with prior radiation therapy who presented to the office with gross hematuria.  He was found on office CT to have left renal cyst and tiny stones but no obvious filling defects within the collecting system.  A left upper pole infundibulum however was not fully visualized.  He he had cystoscopy following CT and was noted to have gross blood from the left ureteral orifice.  He returns today for further evaluation.  Procedure: He was given 3 g of Ancef.  A general anesthetic was induced.  He was placed in lithotomy position and fitted with PAS hose.  His perineum and genitalia were prepped with Betadine solution and he was draped in usual sterile fashion.  Cystoscopy was performed using a 23 [place] scope and 30 degree lens.  Examination revealed a normal urethra.  The external sphincter was intact.  The prostatic urethra was approximately 2 to 3 cm in length with some bilobar hyperplasia and radiation changes with neovascularity.  The bladder had moderate trabeculation with radiation neovascularity in the area of the trigone but no tumors  or stones were noted.  Ureteral orifices were in the normal anatomic position effluxing clear urine.  A left retrograde pyelogram was performed with a 5 French opening catheter and Omnipaque  The left retrograde pyelogram demonstrated a normal ureter and intrarenal collecting system.  A guidewire was then passed through the opening catheter to the kidney and the cystoscope was removed.  A 12 French 35  cm digital access sheath inner core was then used to dilate the distal ureter to the mid ureter.  The 8 French single-lumen mid digital flexible ureteroscope was then passed alongside the wire.  I was able to cannulate the ureteral orifice but could not get beyond the mid ureter alongside the wire.  The ureteroscope was then removed and was replaced over the wire and I was able to advance it to the kidney.  The wire was removed and the intrarenal collecting system was inspected there were Randall's plaques noted in several papilla with the most extensive in the lower calyx which bled some following irrigation.  No papillary tumors were noted and no significant mucosal changes were identified.  Of note in the lower pole calyx there was bluish discoloration below the mucosa which was felt to be consistent with the underlying cyst that was seen on CT in this area.  Once and inspection of all of the calyces had been completed the renal pelvis was irrigated with saline and and multiple aliquots 30 mL's of the washings was collected to be sent for pathologic analysis.  Ureteroscope was removed and the remaining ureter was inspected on withdrawal.  No lesions were identified.  Once the ureteroscope was then removed the cystoscope was reinserted and the bladder was partially drained.  The cystoscope was removed, he was taken down from lithotomy position, the anesthetic was reversed and he was moved to the PACU in stable condition.   There were no complications.

## 2017-09-27 NOTE — Anesthesia Postprocedure Evaluation (Signed)
Anesthesia Post Note  Patient: [name]  Procedure(s) Performed: Phoebe Sharps AND RENAL WASHINGS (Left Renal)     Patient location during evaluation: PACU Anesthesia Type: General Level of consciousness: awake and alert Pain management: pain level controlled Vital Signs Assessment: post-procedure vital signs reviewed and stable Respiratory status: spontaneous breathing, nonlabored ventilation, respiratory function stable and patient connected to nasal cannula oxygen Cardiovascular status: blood pressure returned to baseline and stable Postop Assessment: no apparent nausea or vomiting Anesthetic complications: no    Last Vitals:  Vitals:   09/27/17 1330 09/27/17 1400  BP: (!) 164/82 (!) 149/100  Pulse: 61 75  Resp: 12 12  Temp:  36.5 C  SpO2: 97% 100%    Last Pain:  Vitals:   09/27/17 1400  TempSrc:   PainSc: 0-No pain                 Disha Cottam P Karrissa Parchment

## 2017-09-28 ENCOUNTER — Encounter (HOSPITAL_BASED_OUTPATIENT_CLINIC_OR_DEPARTMENT_OTHER): Payer: Self-pay | Admitting: Urology

## 2017-09-28 LAB — URINE CULTURE: Culture: NO GROWTH

## 2017-10-05 DIAGNOSIS — S42291A Other displaced fracture of upper end of right humerus, initial encounter for closed fracture: Secondary | ICD-10-CM | POA: Diagnosis not present

## 2017-10-08 ENCOUNTER — Ambulatory Visit (HOSPITAL_COMMUNITY): Payer: Medicare Other | Attending: Orthopedic Surgery | Admitting: Specialist

## 2017-10-08 ENCOUNTER — Encounter (HOSPITAL_COMMUNITY): Payer: Self-pay | Admitting: Specialist

## 2017-10-08 DIAGNOSIS — M25511 Pain in right shoulder: Secondary | ICD-10-CM | POA: Diagnosis not present

## 2017-10-08 DIAGNOSIS — M25611 Stiffness of right shoulder, not elsewhere classified: Secondary | ICD-10-CM | POA: Diagnosis not present

## 2017-10-08 DIAGNOSIS — R29898 Other symptoms and signs involving the musculoskeletal system: Secondary | ICD-10-CM | POA: Diagnosis not present

## 2017-10-08 DIAGNOSIS — R278 Other lack of coordination: Secondary | ICD-10-CM

## 2017-10-08 NOTE — Therapy (Signed)
[place], [place], [address] Phone: [phone]   Fax:  [phone]  Occupational Therapy Treatment  Patient Details  Name: [name] MRN: [id number] Date of Birth: [birth year] Referring Provider: Dr. Victorino December   Encounter Date: 10/08/2017  OT End of Session - 10/08/17 1550    Visit Number  4    Number of Visits  24    Date for OT Re-Evaluation  12/07/17 mini reassess due on 7/12    Authorization Type  Primary insurance is Medicare; secondary is BCBC; visit limit of 60 with 7 used.    Authorization Time Period  per calendar year    Authorization - Visit Number  11    Authorization - Number of Visits  50    OT Start Time  1572    OT Stop Time  1430    OT Time Calculation (min)  45 min    Activity Tolerance  Patient tolerated treatment well;Patient limited by pain    Behavior During Therapy  [facility] for tasks assessed/performed       Past Medical History:  Diagnosis Date  . Aortic atherosclerosis ([place]) followed by dr Trula Slade   2004  s/p  closure Penetrating atherosclerotic ulcer of infarenal aorta w/ endovascular stent graft  . Arthritis   . CKD (chronic kidney disease), stage III ([place])   . Closed fracture of head of humerus 07/2017   right shoulder from fall; 09-25-2017 per pt no surgical intervention, only wore sling, intermittant pain  . Coronary atherosclerosis of native coronary artery cardiologist-  dr Zigmund Gottron   positive myoview for ischemia 09-27-2009;  10-01-2009 per cardiac cath-- minimal nonobstructive CAD w/ 30% LAD   . ED (erectile dysfunction)   . Emphysema/COPD [facility])    followed by pcp--- last exacerbation 09-12-2017;  09-25-2017 per pt no cough, sob or congestion  . Essential hypertension, benign   . First degree heart block   . Frequency of urination   . GERD (gastroesophageal reflux disease)   . Gout    09-25-2017  per pt stable,  last episode 2015  . Heart murmur   . History of  adenomatous polyp of colon   . History of closed head injury 2005   per pt residual resolved  . History of external beam radiation therapy    completed 10/ 2015 for prostate cancer  . History of gastric ulcer 2014  . History of kidney stones   . History of pneumothorax    spontaneous pneumo treated w/ chest tube  . History of rib fracture 07/2017   from fall,  right side 6th,7th,8th  . Neuropathy   . OAB (overactive bladder)   . OSA (obstructive sleep apnea)    Intolerant of CPAP  . Prostate cancer [facility]) urologist-  dr wrenn/  oncologist-- dr Alen Blew and dr Tammi Klippel   dx 09-26-2011 via bx-- Stage T3b,N0,  Gleason 4+4, PSA 11.2 with METs to external iliac lymph node(resolved with ADT)-- treated w/ ADT ;   05/2013 staging work-up for rising PSA , started casodex and completed radiation therapy 10/ 2015;   rising PSA post treatment  . RLS (restless legs syndrome)   . Ureteral neocystostomy bleed    left side  . Urge urinary incontinence     Past Surgical History:  Procedure Laterality Date  . ABDOMINAL AORTIC ENDOVASCULAR STENT GRAFT  09-13-2007    dr [name]  [facility]   closure penetrating atherosclerotic ulcer of infarenal aorta with stent graft  .  CARDIAC CATHETERIZATION  2005   per pt normal (done in [place])  . CARDIAC CATHETERIZATION  10/06/2009    dr cooper   minimal nonobstructive CAD w/30% LAD otherwise normal coronaries, LVEDP 8mmHg  . CARDIOVASCULAR STRESS TEST  09/27/2009    dr Aundra Dubin   lexiscan nuclear study w/ moderate reversible inferior perfusion defect ischemia,  ef 60% (cardiac cath scheduled)  . CHEST TUBE INSERTION  1989   "collapsed lung"due to injury  . CIRCUMCISION  09/26/2011   Procedure: CIRCUMCISION ADULT;  Surgeon: [name], MD;  Location: [facility];  Service: Urology;  Laterality: N/A;  . COLONOSCOPY W/ POLYPECTOMY    . COLONOSCOPY WITH PROPOFOL N/A 04/27/2016   Procedure: COLONOSCOPY WITH PROPOFOL;  Surgeon: [name], MD;  [facility];   Service: Endoscopy;  Laterality: N/A;  1115-moved to 845 per Ginger  . CYSTOSCOPY  09/26/2011   Procedure: CYSTOSCOPY;  Surgeon: [name], MD;  Location: [facility];  Service: Urology;  Laterality: N/A;  . CYSTOSCOPY/RETROGRADE/URETEROSCOPY Left 09/27/2017   Procedure: CYSTOSCOPYLEFT Marlow Baars AND RENAL WASHINGS;  Surgeon: [name], MD;  Location: [facility];  Service: Urology;  Laterality: Left;  . ESOPHAGOGASTRODUODENOSCOPY (EGD) WITH PROPOFOL N/A 04/27/2016   Procedure: ESOPHAGOGASTRODUODENOSCOPY (EGD) WITH PROPOFOL;  Surgeon: [name], MD;  [facility];  Service: Endoscopy;  Laterality: N/A;  . EXTRACORPOREAL SHOCK WAVE LITHOTRIPSY  yrs ago  . FRACTURE SURGERY  child   Bilateral lower arms   . KNEE ARTHROSCOPY Right 2013  . MALONEY DILATION N/A 04/27/2016   Procedure: Venia Minks DILATION;  Surgeon: [name], MD;  [facility];  Service: Endoscopy;  Laterality: N/A;  . PARTIAL KNEE ARTHROPLASTY Right 04/26/2015   Procedure: RIGHT KNEE MEDIAL UNICOMPARTMENTAL ARTHROPLASTY;  Surgeon: Gaynelle Arabian, MD;  Location: [facility];  Service: Orthopedics;  Laterality: Right;  . POLYPECTOMY  04/27/2016   Procedure: POLYPECTOMY;  Surgeon: [name], MD;  [facility];  Service: Endoscopy;;  cecal , ascending, descending, and sigmoid polypectomies  . PROSTATE BIOPSY  09/26/2011   Procedure: BIOPSY TRANSRECTAL ULTRASONIC PROSTATE (TUBP);  Surgeon: [name], MD;  Location: [facility];  Service: Urology;  Laterality: N/A;     . TRANSTHORACIC ECHOCARDIOGRAM  01-07-2013   dr [name]   ef 60-65%,  grade 1 diastolic dysfunction/  mild AR without stenosis/  mild LAE/  mild to moderate calcificed MV annulus without regurg. or stenosis/  . UMBILICAL HERNIA REPAIR  yrs ago    There were no vitals filed for this visit.  Subjective Assessment - 10/08/17 1549    Subjective   S: I had to have unexpected surgery.  I have continued my exercises  at home.      Currently in Pain?  Yes    Pain Score  4     Pain Location  Shoulder    Pain Orientation  Right    Pain Descriptors / Indicators  Aching    Pain Type  Acute pain    Pain Onset  More than a month ago    Pain Frequency  Intermittent    Aggravating Factors   use and movement    Pain Relieving Factors  rest    Effect of Pain on Daily Activities  moderate affect on ADLS         [facility] OT Assessment - 10/08/17 0001      Assessment   Medical Diagnosis  right shoulder pain secondary to right proximal  humerus fracture      Precautions   Precautions  Shoulder    Type of Shoulder Precautions  Begin gentle P/ROM and AA/ROM; lifting limit with no more than 2#       Restrictions   Weight Bearing Restrictions  Yes    Other Position/Activity Restrictions  non weightbearing on RUE               OT Treatments/Exercises (OP) - 10/08/17 0001      Exercises   Exercises  Shoulder      Shoulder Exercises: Supine   Protraction  PROM;5 reps;AAROM;10 reps    Horizontal ABduction  PROM;5 reps;AAROM;10 reps    External Rotation  PROM;5 reps;AAROM;10 reps    Internal Rotation  PROM;5 reps;AAROM;10 reps    Flexion  PROM;5 reps;AAROM;10 reps    ABduction  PROM;5 reps;AAROM;10 reps      Shoulder Exercises: Seated   Elevation  AROM;10 reps    Extension  AROM;10 reps    Row  AROM;10 reps      Shoulder Exercises: Therapy Ball   Flexion  Right;10 reps    ABduction  Right;10 reps      Manual Therapy   Manual Therapy  Myofascial release    Manual therapy comments  Myofascial release performed seperate from other exercises.    Myofascial Release  Myofascial release performed on upper arm, trapezius, and scapularis region to reduce fascial restrictions in order to increase ROM and decrease pain.               OT Short Term Goals - 09/18/17 1050      OT SHORT TERM GOAL #1   Title  Patient will be educated and independent with HEP to faciliate progress in therapy and  allow him to return to using his RUE as his dominant extremity for all daily tasks.    Time  6    Period  Weeks    Status  On-going      OT SHORT TERM GOAL #2   Title  Patient will increase RUE strength to 3+/5 in order to complete daily and leisure tasks.    Time  6    Period  Weeks    Status  On-going      OT SHORT TERM GOAL #3   Title   Patient will increase RUE P/ROM to [facility] to increase ability to complete ADLs and be able to go fishing.    Time  6    Period  Weeks    Status  On-going      OT SHORT TERM GOAL #4   Title   Patient will decrease pain level in RUE during daily tasks to 5/10 or less.      Time  6    Period  Weeks    Status  On-going      OT SHORT TERM GOAL #5   Title  Patient will decrease fascial restrictions in RUE to min amount or less in order to increase functional mobility needed to complete functional reaching tasks.      Time  6    Period  Weeks    Status  On-going        OT Long Term Goals - 09/18/17 1051      OT LONG TERM GOAL #1   Title  Patient will return to highest level of independence with all daily and leisure tasks while using her RUE as his dominant.      Time  12  Period  Weeks    Status  On-going      OT LONG TERM GOAL #2   Title  Patient will increase his RUE strength to 4/5 in order to return to be able to complete all daily routine tasks.    Time  12    Period  Weeks    Status  On-going      OT LONG TERM GOAL #3   Title   Patient will increase RUE A/ROM to [facility] to increase ability to complete functional reaching tasks above head.     Time  12    Period  Weeks    Status  On-going      OT LONG TERM GOAL #4   Title  Patient will report a decrease in pain level for RUE while completing daily tasks and fishing to 3/10 or less.      Time  12    Period  Weeks    Status  On-going      OT LONG TERM GOAL #5   Title  Patient will decrease fascial restrictions to trace amount or less in RUE in order to increase functional mobility  needed to complete tasks.      Time  12    Period  Weeks    Status  On-going            Plan - 10/08/17 1551    Clinical Impression Statement  A:  Patient resumed therapy this date, after being out since 09/20/17 due to an unrelated surgery.  Patient reports that he has been completing his HEP at home, and his shoulder is feeling somewhat better.  Reports that MD said it is all the way in the socket now.  Resumed manual therapy, stretching within pain limits, and gentle therapeutic exercises .    OT Treatment/Interventions  Self-care/ADL training;Moist Heat;Therapeutic activities;Ultrasound;Therapeutic exercise;Cryotherapy;Neuromuscular education;Passive range of motion;Electrical Stimulation;Paraffin;Energy conservation;Manual Therapy;Patient/family education    Plan  P:  Continue manual therapy interventions to decrease pain and restrictions, attempt dowel in seated, attempted this visit and patient was unable to complete due to elevated pain level.     Consulted and Agree with Plan of Care  Patient       Patient will benefit from skilled therapeutic intervention in order to improve the following deficits and impairments:  Decreased range of motion, Decreased endurance, Decreased activity tolerance, Increased fascial restrictions, Impaired UE functional use, Pain, Decreased mobility, Decreased strength  Visit Diagnosis: Acute pain of right shoulder  Other lack of coordination  Stiffness of right shoulder, not elsewhere classified  Other symptoms and signs involving the musculoskeletal system    Problem List Patient Active Problem List   Diagnosis Date Noted  . Dysphagia 07/10/2016  . Diarrhea 07/10/2016  . BMI 40.0-44.9, adult ([place]) 05/31/2015  . Neuropathy 03/17/2015  . Prostate cancer ([place]) 06/30/2013  . Abdominal aneurysm without mention of rupture 02/19/2013  . Need for prophylactic vaccination and inoculation against influenza 02/03/2013  . ANXIETY 01/14/2013  .  OBESITY NOS 01/14/2013  . VITAMIN B12 DEFICIENCY 01/14/2013  . TOBACCO ABUSE 01/14/2013  . PTSD 01/14/2013  . COPD (chronic obstructive pulmonary disease) ([place])   . OSA (obstructive sleep apnea) 08/14/2012  . RLS (restless legs syndrome) 08/14/2012  . Arthritis of both knees 08/14/2012  . Prediabetes 08/14/2012  . Nephrolithiasis 08/14/2012  . Aortic atherosclerosis ([place]) 02/19/2012  . Coronary atherosclerosis of native coronary artery 03/23/2010  . CHRONIC KIDNEY DISEASE STAGE II (MILD) 10/13/2009  . GERD 07/30/2008  .  BENIGN PROSTATIC HYPERTROPHY, WITH OBSTRUCTION 02/20/2008  . Hyperlipidemia 01/31/2007  . GOUT 01/31/2007  . Essential hypertension, benign 01/31/2007    Vangie Bicker, [place], OTR/L [phone]  10/08/2017, 3:56 PM  [place] [address] [place], [place], [address] Phone: [phone]   Fax:  [phone]  Name: [name] MRN: [id number] Date of Birth: Nov 20, 1945

## 2017-10-10 ENCOUNTER — Ambulatory Visit (HOSPITAL_COMMUNITY): Payer: Medicare Other

## 2017-10-10 ENCOUNTER — Other Ambulatory Visit: Payer: Self-pay

## 2017-10-10 ENCOUNTER — Encounter (HOSPITAL_COMMUNITY): Payer: Self-pay

## 2017-10-10 DIAGNOSIS — M25611 Stiffness of right shoulder, not elsewhere classified: Secondary | ICD-10-CM

## 2017-10-10 DIAGNOSIS — M25511 Pain in right shoulder: Secondary | ICD-10-CM

## 2017-10-10 DIAGNOSIS — R278 Other lack of coordination: Secondary | ICD-10-CM | POA: Diagnosis not present

## 2017-10-10 DIAGNOSIS — R29898 Other symptoms and signs involving the musculoskeletal system: Secondary | ICD-10-CM

## 2017-10-10 NOTE — Therapy (Signed)
[place], [place], [address] Phone: [phone]   Fax:  [phone]  Occupational Therapy Treatment  Patient Details  Name: [name] MRN: [id number] Date of Birth: 11-27-1945 Referring Provider: Dr. Victorino December   Encounter Date: 10/10/2017  OT End of Session - 10/10/17 1057    Visit Number  5    Number of Visits  24    Date for OT Re-Evaluation  12/07/17 mini reassess due on 7/12    Authorization Type  Primary insurance is Medicare; secondary is BCBC; visit limit of 60 with 7 used.    Authorization Time Period  per calendar year    Authorization - Visit Number  11    Authorization - Number of Visits  50    OT Start Time  1031    OT Stop Time  1112    OT Time Calculation (min)  41 min    Activity Tolerance  Patient tolerated treatment well;Patient limited by pain    Behavior During Therapy  WFL for tasks assessed/performed       Past Medical History:  Diagnosis Date  . Aortic atherosclerosis ([place]) followed by dr Trula Slade   2004  s/p  closure Penetrating atherosclerotic ulcer of infarenal aorta w/ endovascular stent graft  . Arthritis   . CKD (chronic kidney disease), stage III ([place])   . Closed fracture of head of humerus 07/2017   right shoulder from fall; 09-25-2017 per pt no surgical intervention, only wore sling, intermittant pain  . Coronary atherosclerosis of native coronary artery cardiologist-  dr Zigmund Gottron   positive myoview for ischemia 09-27-2009;  10-01-2009 per cardiac cath-- minimal nonobstructive CAD w/ 30% LAD   . ED (erectile dysfunction)   . Emphysema/COPD [facility])    followed by pcp--- last exacerbation 09-12-2017;  09-25-2017 per pt no cough, sob or congestion  . Essential hypertension, benign   . First degree heart block   . Frequency of urination   . GERD (gastroesophageal reflux disease)   . Gout    09-25-2017  per pt stable,  last episode 2015  . Heart murmur   . History of  adenomatous polyp of colon   . History of closed head injury 2005   per pt residual resolved  . History of external beam radiation therapy    completed 10/ 2015 for prostate cancer  . History of gastric ulcer 2014  . History of kidney stones   . History of pneumothorax    spontaneous pneumo treated w/ chest tube  . History of rib fracture 07/2017   from fall,  right side 6th,7th,8th  . Neuropathy   . OAB (overactive bladder)   . OSA (obstructive sleep apnea)    Intolerant of CPAP  . Prostate cancer [facility]) urologist-  dr wrenn/  oncologist-- dr Alen Blew and dr Tammi Klippel   dx 09-26-2011 via bx-- Stage T3b,N0,  Gleason 4+4, PSA 11.2 with METs to external iliac lymph node(resolved with ADT)-- treated w/ ADT ;   05/2013 staging work-up for rising PSA , started casodex and completed radiation therapy 10/ 2015;   rising PSA post treatment  . RLS (restless legs syndrome)   . Ureteral neocystostomy bleed    left side  . Urge urinary incontinence     Past Surgical History:  Procedure Laterality Date  . ABDOMINAL AORTIC ENDOVASCULAR STENT GRAFT  09-13-2007    dr [name]  [facility]   closure penetrating atherosclerotic ulcer of infarenal aorta with stent graft  .  CARDIAC CATHETERIZATION  2005   per pt normal (done in [place])  . CARDIAC CATHETERIZATION  10/06/2009    dr cooper   minimal nonobstructive CAD w/30% LAD otherwise normal coronaries, LVEDP 58mmHg  . CARDIOVASCULAR STRESS TEST  09/27/2009    dr Aundra Dubin   lexiscan nuclear study w/ moderate reversible inferior perfusion defect ischemia,  ef 60% (cardiac cath scheduled)  . CHEST TUBE INSERTION  1989   "collapsed lung"due to injury  . CIRCUMCISION  09/26/2011   Procedure: CIRCUMCISION ADULT;  Surgeon: [name], MD;  Location: [facility];  Service: Urology;  Laterality: N/A;  . COLONOSCOPY W/ POLYPECTOMY    . COLONOSCOPY WITH PROPOFOL N/A 04/27/2016   Procedure: COLONOSCOPY WITH PROPOFOL;  Surgeon: [name], MD;  [facility];   Service: Endoscopy;  Laterality: N/A;  1115-moved to 845 per Ginger  . CYSTOSCOPY  09/26/2011   Procedure: CYSTOSCOPY;  Surgeon: [name], MD;  Location: [facility];  Service: Urology;  Laterality: N/A;  . CYSTOSCOPY/RETROGRADE/URETEROSCOPY Left 09/27/2017   Procedure: CYSTOSCOPYLEFT Marlow Baars AND RENAL WASHINGS;  Surgeon: [name], MD;  Location[facility];  Service: Urology;  Laterality: Left;  . ESOPHAGOGASTRODUODENOSCOPY (EGD) WITH PROPOFOL N/A 04/27/2016   Procedure: ESOPHAGOGASTRODUODENOSCOPY (EGD) WITH PROPOFOL;  Surgeon: [name], MD;  [facility];  Service: Endoscopy;  Laterality: N/A;  . EXTRACORPOREAL SHOCK WAVE LITHOTRIPSY  yrs ago  . FRACTURE SURGERY  child   Bilateral lower arms   . KNEE ARTHROSCOPY Right 2013  . MALONEY DILATION N/A 04/27/2016   Procedure: Venia Minks DILATION;  Surgeon: [name], MD;  [facility];  Service: Endoscopy;  Laterality: N/A;  . PARTIAL KNEE ARTHROPLASTY Right 04/26/2015   Procedure: RIGHT KNEE MEDIAL UNICOMPARTMENTAL ARTHROPLASTY;  Surgeon: Gaynelle Arabian, MD;  Location: [facility];  Service: Orthopedics;  Laterality: Right;  . POLYPECTOMY  04/27/2016   Procedure: POLYPECTOMY;  Surgeon: [name], MD;  [facility];  Service: Endoscopy;;  cecal , ascending, descending, and sigmoid polypectomies  . PROSTATE BIOPSY  09/26/2011   Procedure: BIOPSY TRANSRECTAL ULTRASONIC PROSTATE (TUBP);  Surgeon: [name], MD;  Location: [facility];  Service: Urology;  Laterality: N/A;     . TRANSTHORACIC ECHOCARDIOGRAM  01-07-2013   dr [name]   ef 60-65%,  grade 1 diastolic dysfunction/  mild AR without stenosis/  mild LAE/  mild to moderate calcificed MV annulus without regurg. or stenosis/  . UMBILICAL HERNIA REPAIR  yrs ago    There were no vitals filed for this visit.  Subjective Assessment - 10/10/17 1031    Subjective   S: After last session I was so sore I could barely move.     Currently in Pain?  Yes    Pain Score  4     Pain Location  Shoulder    Pain Orientation  Right    Pain Descriptors / Indicators  Aching    Pain Type  Acute pain    Pain Radiating Towards  N/A    Pain Onset  More than a month ago    Pain Frequency  Intermittent    Aggravating Factors   use and movement    Pain Relieving Factors  rest    Effect of Pain on Daily Activities  max effect on ADLs    Multiple Pain Sites  No         [facility] OT Assessment - 10/10/17 1030      Assessment  Medical Diagnosis  right shoulder pain secondary to right proximal humerus fracture      Precautions   Precautions  Shoulder    Type of Shoulder Precautions  Begin gentle P/ROM and AA/ROM; lifting limit with no more than 2#                OT Treatments/Exercises (OP) - 10/10/17 1031      Exercises   Exercises  Shoulder      Shoulder Exercises: Supine   Protraction  PROM;5 reps;AAROM;10 reps    Horizontal ABduction  PROM;5 reps;AAROM;10 reps    External Rotation  PROM;5 reps;AAROM;10 reps    Internal Rotation  PROM;5 reps;AAROM;10 reps    Flexion  PROM;5 reps;AAROM;10 reps    ABduction  PROM;5 reps;AAROM 8 reps due to pain for AA/ROM      Shoulder Exercises: Seated   Elevation  AROM;10 reps    Row  AROM;10 reps    Protraction  AAROM;10 reps    Flexion  AAROM;10 reps    Flexion Limitations  to shoulder height due to pain    Other Seated Exercises  shoulder depression; A/ROM; 10X      Manual Therapy   Manual Therapy  Myofascial release    Manual therapy comments  Myofascial release performed seperate from other exercises.    Myofascial Release  Myofascial release performed on upper arm, trapezius, and scapularis region to reduce fascial restrictions in order to increase ROM and decrease pain.             OT Education - 10/10/17 1052    Education Details  AA/ROM in supine added to HEP and patient instructed to complete as tolerated in additional to table stretches.     Person(s) Educated  Patient    Methods  Explanation;Demonstration;Verbal cues;Handout    Comprehension  Verbalized understanding;Returned demonstration       OT Short Term Goals - 09/18/17 1050      OT SHORT TERM GOAL #1   Title  Patient will be educated and independent with HEP to faciliate progress in therapy and allow him to return to using his RUE as his dominant extremity for all daily tasks.    Time  6    Period  Weeks    Status  On-going      OT SHORT TERM GOAL #2   Title  Patient will increase RUE strength to 3+/5 in order to complete daily and leisure tasks.    Time  6    Period  Weeks    Status  On-going      OT SHORT TERM GOAL #3   Title   Patient will increase RUE P/ROM to [facility] to increase ability to complete ADLs and be able to go fishing.    Time  6    Period  Weeks    Status  On-going      OT SHORT TERM GOAL #4   Title   Patient will decrease pain level in RUE during daily tasks to 5/10 or less.      Time  6    Period  Weeks    Status  On-going      OT SHORT TERM GOAL #5   Title  Patient will decrease fascial restrictions in RUE to min amount or less in order to increase functional mobility needed to complete functional reaching tasks.      Time  6    Period  Weeks    Status  On-going  OT Long Term Goals - 09/18/17 1051      OT LONG TERM GOAL #1   Title  Patient will return to highest level of independence with all daily and leisure tasks while using her RUE as his dominant.      Time  12    Period  Weeks    Status  On-going      OT LONG TERM GOAL #2   Title  Patient will increase his RUE strength to 4/5 in order to return to be able to complete all daily routine tasks.    Time  12    Period  Weeks    Status  On-going      OT LONG TERM GOAL #3   Title   Patient will increase RUE A/ROM to [facility] to increase ability to complete functional reaching tasks above head.     Time  12    Period  Weeks    Status  On-going      OT LONG TERM GOAL #4    Title  Patient will report a decrease in pain level for RUE while completing daily tasks and fishing to 3/10 or less.      Time  12    Period  Weeks    Status  On-going      OT LONG TERM GOAL #5   Title  Patient will decrease fascial restrictions to trace amount or less in RUE in order to increase functional mobility needed to complete tasks.      Time  12    Period  Weeks    Status  On-going            Plan - 10/10/17 1102    Clinical Impression Statement  A: Minimal fascial restrictions palpated in upper arm and proximal shoulder shoulder region this session. Patient continued with AA/ROM in supine and progressed to AA/ROM for protraction and flexion in seated. Patient only able to complete 8 reps of supine AA/ROM for abduction with limited ROM due to pain. Patient reporting a popping sensation with abduction and external rotation. Patient able to complete protraction and flexion for AA/ROM in seated with extra time due to pain. Flexion limited to shoulder height due to pain. Patient instructed to attempt AA/ROM exercises in supine as part of HEP.      OT Treatment/Interventions  Self-care/ADL training;Moist Heat;Therapeutic activities;Ultrasound;Therapeutic exercise;Cryotherapy;Neuromuscular education;Passive range of motion;Electrical Stimulation;Paraffin;Energy conservation;Manual Therapy;Patient/family education    Plan  P: Complete mini reassessment. Continue manual therapy interventions to decrease pain and restrictions. Continue with AA/ROM in supine and attempt to add remaining AA/ROM exercises in seated.    Consulted and Agree with Plan of Care  Patient       Patient will benefit from skilled therapeutic intervention in order to improve the following deficits and impairments:  Decreased range of motion, Decreased endurance, Decreased activity tolerance, Increased fascial restrictions, Impaired UE functional use, Pain, Decreased mobility, Decreased strength  Visit  Diagnosis: Acute pain of right shoulder  Stiffness of right shoulder, not elsewhere classified  Other symptoms and signs involving the musculoskeletal system    Problem List Patient Active Problem List   Diagnosis Date Noted  . Dysphagia 07/10/2016  . Diarrhea 07/10/2016  . BMI 40.0-44.9, adult ([place]) 05/31/2015  . Neuropathy 03/17/2015  . Prostate cancer ([place]) 06/30/2013  . Abdominal aneurysm without mention of rupture 02/19/2013  . Need for prophylactic vaccination and inoculation against influenza 02/03/2013  . ANXIETY 01/14/2013  . OBESITY NOS 01/14/2013  .  VITAMIN B12 DEFICIENCY 01/14/2013  . TOBACCO ABUSE 01/14/2013  . PTSD 01/14/2013  . COPD (chronic obstructive pulmonary disease) ([place])   . OSA (obstructive sleep apnea) 08/14/2012  . RLS (restless legs syndrome) 08/14/2012  . Arthritis of both knees 08/14/2012  . Prediabetes 08/14/2012  . Nephrolithiasis 08/14/2012  . Aortic atherosclerosis ([place]) 02/19/2012  . Coronary atherosclerosis of native coronary artery 03/23/2010  . CHRONIC KIDNEY DISEASE STAGE II ([facility]) 10/13/2009  . GERD 07/30/2008  . BENIGN PROSTATIC HYPERTROPHY, WITH OBSTRUCTION 02/20/2008  . Hyperlipidemia 01/31/2007  . GOUT 01/31/2007  . Essential hypertension, benign 01/31/2007    Roderic Palau, OT student 10/10/2017, 11:14 AM  [facility] [address] [place], [place], [address] Phone: [phone]   Fax:  [phone]  Name: KRYSTOFER HEVENER MRN: [id number] Date of Birth: 19-Dec-1945

## 2017-10-10 NOTE — Patient Instructions (Signed)
Perform each exercise 10-15 reps. 1-3x days. COMPLETE LAYING DOWN.  Protraction - STANDING  Start by holding a wand or cane at chest height.  Next, slowly push the wand outwards in front of your body so that your elbows become fully straightened. Then, return to the original position.     Shoulder FLEXION - STANDING - PALMS UP  In the standing position, hold a wand/cane with both arms, palms up on both sides. Raise up the wand/cane allowing your unaffected arm to perform most of the effort. Your affected arm should be partially relaxed.      Internal/External ROTATION - STANDING  In the standing position, hold a wand/cane with both hands keeping your elbows bent. Move your arms and wand/cane to one side.  Your affected arm should be partially relaxed while your unaffected arm performs most of the effort.       Shoulder ABDUCTION - STANDING  While holding a wand/cane palm face up on the injured side and palm face down on the uninjured side, slowly raise up your injured arm to the side.                     Horizontal Abduction/Adduction      Straight arms holding cane at shoulder height, bring cane to right, center, left. Repeat starting to left.   Copyright  VHI. All rights reserved.

## 2017-10-11 DIAGNOSIS — C61 Malignant neoplasm of prostate: Secondary | ICD-10-CM | POA: Diagnosis not present

## 2017-10-11 DIAGNOSIS — R310 Gross hematuria: Secondary | ICD-10-CM | POA: Diagnosis not present

## 2017-10-11 DIAGNOSIS — R351 Nocturia: Secondary | ICD-10-CM | POA: Diagnosis not present

## 2017-10-16 ENCOUNTER — Ambulatory Visit (HOSPITAL_COMMUNITY): Payer: Medicare Other

## 2017-10-16 ENCOUNTER — Other Ambulatory Visit: Payer: Self-pay

## 2017-10-16 ENCOUNTER — Encounter (HOSPITAL_COMMUNITY): Payer: Self-pay

## 2017-10-16 DIAGNOSIS — M25511 Pain in right shoulder: Secondary | ICD-10-CM

## 2017-10-16 DIAGNOSIS — R29898 Other symptoms and signs involving the musculoskeletal system: Secondary | ICD-10-CM | POA: Diagnosis not present

## 2017-10-16 DIAGNOSIS — R278 Other lack of coordination: Secondary | ICD-10-CM | POA: Diagnosis not present

## 2017-10-16 DIAGNOSIS — M25611 Stiffness of right shoulder, not elsewhere classified: Secondary | ICD-10-CM

## 2017-10-16 NOTE — Therapy (Addendum)
[place], [place], [address] Phone: [phone]   Fax:  [phone]  Occupational Therapy Treatment  Patient Details  Name: [name] MRN: [id number] Date of Birth: 23-Nov-1945 Referring Provider: Dr. Victorino December   Progress Note Reporting Period 09/14/17 to 10/16/17  See note below for Objective Data and Assessment of Progress/Goals.       Encounter Date: 10/16/2017  OT End of Session - 10/16/17 1102    Visit Number  6    Number of Visits  24    Date for OT Re-Evaluation  12/07/17 mini reassess due on 8/13    Authorization Type  Primary insurance is Medicare; secondary is BCBC; visit limit of 60 with 7 used.    Authorization Time Period  per calendar year    Authorization - Visit Number  12    Authorization - Number of Visits  50    OT Start Time  5400    OT Stop Time  1114    OT Time Calculation (min)  42 min    Activity Tolerance  Patient tolerated treatment well;Patient limited by pain    Behavior During Therapy  WFL for tasks assessed/performed       Past Medical History:  Diagnosis Date  . Aortic atherosclerosis ([place]) followed by dr Trula Slade   2004  s/p  closure Penetrating atherosclerotic ulcer of infarenal aorta w/ endovascular stent graft  . Arthritis   . CKD (chronic kidney disease), stage III ([place])   . Closed fracture of head of humerus 07/2017   right shoulder from fall; 09-25-2017 per pt no surgical intervention, only wore sling, intermittant pain  . Coronary atherosclerosis of native coronary artery cardiologist-  dr Zigmund Gottron   positive myoview for ischemia 09-27-2009;  10-01-2009 per cardiac cath-- minimal nonobstructive CAD w/ 30% LAD   . ED (erectile dysfunction)   . Emphysema/COPD [facility])    followed by pcp--- last exacerbation 09-12-2017;  09-25-2017 per pt no cough, sob or congestion  . Essential hypertension, benign   . First degree heart block   . Frequency of urination    . GERD (gastroesophageal reflux disease)   . Gout    09-25-2017  per pt stable,  last episode 2015  . Heart murmur   . History of adenomatous polyp of colon   . History of closed head injury 2005   per pt residual resolved  . History of external beam radiation therapy    completed 10/ 2015 for prostate cancer  . History of gastric ulcer 2014  . History of kidney stones   . History of pneumothorax    spontaneous pneumo treated w/ chest tube  . History of rib fracture 07/2017   from fall,  right side 6th,7th,8th  . Neuropathy   . OAB (overactive bladder)   . OSA (obstructive sleep apnea)    Intolerant of CPAP  . Prostate cancer [facility]) urologist-  dr wrenn/  oncologist-- dr Alen Blew and dr Tammi Klippel   dx 09-26-2011 via bx-- Stage T3b,N0,  Gleason 4+4, PSA 11.2 with METs to external iliac lymph node(resolved with ADT)-- treated w/ ADT ;   05/2013 staging work-up for rising PSA , started casodex and completed radiation therapy 10/ 2015;   rising PSA post treatment  . RLS (restless legs syndrome)   . Ureteral neocystostomy bleed    left side  . Urge urinary incontinence     Past Surgical History:  Procedure Laterality Date  . ABDOMINAL AORTIC  ENDOVASCULAR STENT GRAFT  09-13-2007    dr [name]  [facility]   closure penetrating atherosclerotic ulcer of infarenal aorta with stent graft  . CARDIAC CATHETERIZATION  2005   per pt normal (done in [place])  . CARDIAC CATHETERIZATION  10/06/2009    dr cooper   minimal nonobstructive CAD w/30% LAD otherwise normal coronaries, LVEDP 33mHg  . CARDIOVASCULAR STRESS TEST  09/27/2009    dr mAundra Dubin  lexiscan nuclear study w/ moderate reversible inferior perfusion defect ischemia,  ef 60% (cardiac cath scheduled)  . CHEST TUBE INSERTION  1989   "collapsed lung"due to injury  . CIRCUMCISION  09/26/2011   Procedure: CIRCUMCISION ADULT;  Surgeon: JMalka So MD;  Location: [facility];  Service: Urology;  Laterality: N/A;  . COLONOSCOPY W/ POLYPECTOMY    .  COLONOSCOPY WITH PROPOFOL N/A 04/27/2016   Procedure: COLONOSCOPY WITH PROPOFOL;  Surgeon: RDaneil Dolin MD;  [facility];  Service: Endoscopy;  Laterality: N/A;  1115-moved to 845 per Ginger  . CYSTOSCOPY  09/26/2011   Procedure: CYSTOSCOPY;  Surgeon: JMalka So MD;  [facility]: [facility];  Service: Urology;  Laterality: N/A;  . CYSTOSCOPY/RETROGRADE/URETEROSCOPY Left 09/27/2017   Procedure: CYSTOSCOPYLEFT /Marlow BaarsAND RENAL WASHINGS;  Surgeon: WIrine Seal MD;  [facility]  Service: Urology;  Laterality: Left;  . ESOPHAGOGASTRODUODENOSCOPY (EGD) WITH PROPOFOL N/A 04/27/2016   Procedure: ESOPHAGOGASTRODUODENOSCOPY (EGD) WITH PROPOFOL;  Surgeon: RDaneil Dolin MD;  [facility];  Service: Endoscopy;  Laterality: N/A;  . EXTRACORPOREAL SHOCK WAVE LITHOTRIPSY  yrs ago  . FRACTURE SURGERY  child   Bilateral lower arms   . KNEE ARTHROSCOPY Right 2013  . MALONEY DILATION N/A 04/27/2016   Procedure: MVenia MinksDILATION;  Surgeon: RDaneil Dolin MD;  [facility];  Service: Endoscopy;  Laterality: N/A;  . PARTIAL KNEE ARTHROPLASTY Right 04/26/2015   Procedure: RIGHT KNEE MEDIAL UNICOMPARTMENTAL ARTHROPLASTY;  Surgeon: FGaynelle Arabian MD;  [facility];  Service: Orthopedics;  Laterality: Right;  . POLYPECTOMY  04/27/2016   Procedure: POLYPECTOMY;  Surgeon: RDaneil Dolin MD;  [facility];  Service: Endoscopy;;  cecal , ascending, descending, and sigmoid polypectomies  . PROSTATE BIOPSY  09/26/2011   Procedure: BIOPSY TRANSRECTAL ULTRASONIC PROSTATE (TUBP);  Surgeon: JMalka So MD;  [facility];  Service: Urology;  Laterality: N/A;     . TRANSTHORACIC ECHOCARDIOGRAM  01-07-2013   dr mDomenic Polite  ef 60-65%,  grade 1 diastolic dysfunction/  mild AR without stenosis/  mild LAE/  mild to moderate calcificed MV annulus without regurg. or stenosis/  . UMBILICAL HERNIA REPAIR  yrs ago    There were no vitals filed for  this visit.  Subjective Assessment - 10/16/17 1104    Subjective   S: I did those cane exercises on friday and saturday but was in so much pain after that, I couldn't do them.    Currently in Pain?  Yes    Pain Score  3     Pain Location  Shoulder    Pain Orientation  Right    Pain Descriptors / Indicators  Aching    Pain Radiating Towards  N/A    Pain Onset  More than a month ago    Pain Frequency  Constant    Aggravating Factors   use and movement    Pain Relieving Factors  rest; occasional pain pills    Effect of Pain on Daily Activities  max effect  on ADLs    Multiple Pain Sites  No         [facility] OT Assessment - 10/16/17 1029      Assessment   Medical Diagnosis  right shoulder pain secondary to right proximal humerus fracture      Precautions   Precautions  Shoulder    Type of Shoulder Precautions  Begin gentle P/ROM and AA/ROM; lifting limit with no more than 2#       Observation/Other Assessments   Focus on Therapeutic Outcomes (FOTO)   43/100      ROM / Strength   AROM / PROM / Strength  AROM;PROM      Palpation   Palpation comment  moderate fascial restrictions in lateral and anterior upper arm      AROM   Overall AROM Comments  assessed supine; IR/er adducted     AROM Assessment Site  Shoulder    Right/Left Shoulder  Right    Right Shoulder Flexion  168 Degrees previous: 35    Right Shoulder ABduction  125 Degrees previous: 76    Right Shoulder Internal Rotation  90 Degrees same as previous    Right Shoulder External Rotation  60 Degrees previous: 25      PROM   Overall PROM Comments  assessed supine; IR/er adducted    PROM Assessment Site  Shoulder    Right/Left Shoulder  Right    Right Shoulder Flexion  170 Degrees previous: 60    Right Shoulder ABduction  180 Degrees previous: 80    Right Shoulder Internal Rotation  90 Degrees same as previous    Right Shoulder External Rotation  60 Degrees previous: 30      Strength   Overall Strength  Unable to  assess;Due to precautions;Due to pain               OT Treatments/Exercises (OP) - 10/16/17 1029      Exercises   Exercises  Shoulder      Shoulder Exercises: Supine   Protraction  PROM;5 reps    Horizontal ABduction  PROM;5 reps    External Rotation  PROM;5 reps    Internal Rotation  PROM;5 reps    Flexion  PROM;5 reps    ABduction  PROM;5 reps      Shoulder Exercises: Seated   Protraction  AAROM;10 reps    Horizontal ABduction  AAROM;10 reps    External Rotation  AAROM;10 reps    Internal Rotation  AAROM;10 reps    Flexion  AAROM;10 reps    Abduction  AAROM;10 reps    ABduction Weight (lbs)  to shoulder height due to pain      Manual Therapy   Manual Therapy  Myofascial release    Manual therapy comments  Myofascial release performed seperate from other exercises.    Myofascial Release  Myofascial release performed on upper arm, trapezius, and scapularis region to reduce fascial restrictions in order to increase ROM and decrease pain.               OT Short Term Goals - 10/16/17 1249      OT SHORT TERM GOAL #1   Title  Patient will be educated and independent with HEP to faciliate progress in therapy and allow him to return to using his RUE as his dominant extremity for all daily tasks.    Time  6    Period  Weeks    Status  On-going      OT SHORT  TERM GOAL #2   Title  Patient will increase RUE strength to 3+/5 in order to complete daily and leisure tasks.    Time  6    Period  Weeks    Status  On-going      OT SHORT TERM GOAL #3   Title   Patient will increase RUE P/ROM to [facility] to increase ability to complete ADLs and be able to go fishing.    Time  6    Period  Weeks    Status  Achieved      OT SHORT TERM GOAL #4   Title   Patient will decrease pain level in RUE during daily tasks to 5/10 or less.      Time  6    Period  Weeks    Status  On-going      OT SHORT TERM GOAL #5   Title  Patient will decrease fascial restrictions in RUE to min  amount or less in order to increase functional mobility needed to complete functional reaching tasks.      Time  6    Period  Weeks    Status  On-going        OT Long Term Goals - 09/18/17 1051      OT LONG TERM GOAL #1   Title  Patient will return to highest level of independence with all daily and leisure tasks while using her RUE as his dominant.      Time  12    Period  Weeks    Status  On-going      OT LONG TERM GOAL #2   Title  Patient will increase his RUE strength to 4/5 in order to return to be able to complete all daily routine tasks.    Time  12    Period  Weeks    Status  On-going      OT LONG TERM GOAL #3   Title   Patient will increase RUE A/ROM to [facility] to increase ability to complete functional reaching tasks above head.     Time  12    Period  Weeks    Status  On-going      OT LONG TERM GOAL #4   Title  Patient will report a decrease in pain level for RUE while completing daily tasks and fishing to 3/10 or less.      Time  12    Period  Weeks    Status  On-going      OT LONG TERM GOAL #5   Title  Patient will decrease fascial restrictions to trace amount or less in RUE in order to increase functional mobility needed to complete tasks.      Time  12    Period  Weeks    Status  On-going            Plan - 10/16/17 1110    Clinical Impression Statement  A: Min/mod fascial restrictions palpated in anterior and lateral upper arm this session. Mini reassessment performed. P/ROM and A/ROM measured in supine. Patient is demonstrating significantly improved P/ROM and A/ROM as well as decreasing pain levels. P/ROM is [facility]. Short term goal for P/ROM has been met. Patient able to complete all AA/ROM exercises in seated this session with extra time and occasional rest breaks required due to fatigue and high pain levels. AA/ROM for abduction remains limited to shoulder height due to pain. VC for form and technique.    OT Treatment/Interventions  Self-care/ADL  training;Moist Heat;Therapeutic activities;Ultrasound;Therapeutic exercise;Cryotherapy;Neuromuscular education;Passive range of motion;Electrical Stimulation;Paraffin;Energy conservation;Manual Therapy;Patient/family education    Plan  P:  Continue manual therapy interventions to decrease pain and restrictions. Complete AA/ROM in supine and seated keeping reps at 10. Add pulleys.    Consulted and Agree with Plan of Care  Patient       Patient will benefit from skilled therapeutic intervention in order to improve the following deficits and impairments:  Decreased range of motion, Decreased endurance, Decreased activity tolerance, Increased fascial restrictions, Impaired UE functional use, Pain, Decreased mobility, Decreased strength  Visit Diagnosis: Acute pain of right shoulder  Stiffness of right shoulder, not elsewhere classified  Other symptoms and signs involving the musculoskeletal system    Problem List Patient Active Problem List   Diagnosis Date Noted  . Dysphagia 07/10/2016  . Diarrhea 07/10/2016  . BMI 40.0-44.9, adult ([place]) 05/31/2015  . Neuropathy 03/17/2015  . Prostate cancer ([place]) 06/30/2013  . Abdominal aneurysm without mention of rupture 02/19/2013  . Need for prophylactic vaccination and inoculation against influenza 02/03/2013  . ANXIETY 01/14/2013  . OBESITY NOS 01/14/2013  . VITAMIN B12 DEFICIENCY 01/14/2013  . TOBACCO ABUSE 01/14/2013  . PTSD 01/14/2013  . COPD (chronic obstructive pulmonary disease) ([place])   . OSA (obstructive sleep apnea) 08/14/2012  . RLS (restless legs syndrome) 08/14/2012  . Arthritis of both knees 08/14/2012  . Prediabetes 08/14/2012  . Nephrolithiasis 08/14/2012  . Aortic atherosclerosis ([place]) 02/19/2012  . Coronary atherosclerosis of native coronary artery 03/23/2010  . CHRONIC KIDNEY DISEASE STAGE II ([place]) 10/13/2009  . GERD 07/30/2008  . BENIGN PROSTATIC HYPERTROPHY, WITH OBSTRUCTION 02/20/2008  . Hyperlipidemia 01/31/2007  .  GOUT 01/31/2007  . Essential hypertension, benign 01/31/2007    Roderic Palau, OT student 10/16/2017, 2:06 PM  [facility] [address] [place], [place], [address] Phone: [phone]   Fax:  [phone]  Name: [name] MRN: [id number] Date of Birth: 1945/08/30

## 2017-10-18 ENCOUNTER — Encounter: Payer: Self-pay | Admitting: Physician Assistant

## 2017-10-18 ENCOUNTER — Telehealth (HOSPITAL_COMMUNITY): Payer: Self-pay | Admitting: Occupational Therapy

## 2017-10-18 ENCOUNTER — Ambulatory Visit (HOSPITAL_COMMUNITY): Payer: Medicare Other | Admitting: Occupational Therapy

## 2017-10-18 ENCOUNTER — Ambulatory Visit (INDEPENDENT_AMBULATORY_CARE_PROVIDER_SITE_OTHER): Payer: Medicare Other | Admitting: Physician Assistant

## 2017-10-18 VITALS — BP 128/94 | HR 78 | Temp 97.5°F | Ht 68.0 in | Wt 272.0 lb

## 2017-10-18 DIAGNOSIS — R296 Repeated falls: Secondary | ICD-10-CM | POA: Diagnosis not present

## 2017-10-18 DIAGNOSIS — R202 Paresthesia of skin: Secondary | ICD-10-CM | POA: Diagnosis not present

## 2017-10-18 DIAGNOSIS — E611 Iron deficiency: Secondary | ICD-10-CM | POA: Diagnosis not present

## 2017-10-18 DIAGNOSIS — G6289 Other specified polyneuropathies: Secondary | ICD-10-CM | POA: Diagnosis not present

## 2017-10-18 DIAGNOSIS — R200 Anesthesia of skin: Secondary | ICD-10-CM | POA: Diagnosis not present

## 2017-10-18 DIAGNOSIS — E559 Vitamin D deficiency, unspecified: Secondary | ICD-10-CM | POA: Diagnosis not present

## 2017-10-18 DIAGNOSIS — R7303 Prediabetes: Secondary | ICD-10-CM

## 2017-10-18 LAB — BAYER DCA HB A1C WAIVED: HB A1C (BAYER DCA - WAIVED): 5.6 % (ref <7.0)

## 2017-10-18 NOTE — Telephone Encounter (Signed)
Patient call to let us know he took a bad fall on yesterday and was on the floor for about 5 hours before he could get some help. His is going to his MD today and will call us on Monday

## 2017-10-19 LAB — SEDIMENTATION RATE: Sed Rate: 35 mm/hr — ABNORMAL HIGH (ref 0–30)

## 2017-10-19 LAB — CMP14+EGFR
ALT: 14 IU/L (ref 0–44)
AST: 14 IU/L (ref 0–40)
Albumin/Globulin Ratio: 1.6 (ref 1.2–2.2)
Albumin: 4.4 g/dL (ref 3.5–4.8)
Alkaline Phosphatase: 92 IU/L (ref 39–117)
BUN/Creatinine Ratio: 14 (ref 10–24)
BUN: 28 mg/dL — ABNORMAL HIGH (ref 8–27)
Bilirubin Total: 0.5 mg/dL (ref 0.0–1.2)
CO2: 22 mmol/L (ref 20–29)
Calcium: 9.1 mg/dL (ref 8.6–10.2)
Chloride: 103 mmol/L (ref 96–106)
Creatinine, Ser: 2 mg/dL — ABNORMAL HIGH (ref 0.76–1.27)
GFR calc Af Amer: 37 mL/min/{1.73_m2} — ABNORMAL LOW (ref >59)
GFR calc non Af Amer: 32 mL/min/{1.73_m2} — ABNORMAL LOW (ref >59)
Globulin, Total: 2.7 g/dL (ref 1.5–4.5)
Glucose: 100 mg/dL — ABNORMAL HIGH (ref 65–99)
Potassium: 3.7 mmol/L (ref 3.5–5.2)
Sodium: 139 mmol/L (ref 134–144)
Total Protein: 7.1 g/dL (ref 6.0–8.5)

## 2017-10-19 LAB — CBC WITH DIFFERENTIAL/PLATELET
Basophils Absolute: 0 10*3/uL (ref 0.0–0.2)
Basos: 0 %
EOS (ABSOLUTE): 0.3 10*3/uL (ref 0.0–0.4)
Eos: 3 %
Hematocrit: 40.6 % (ref 37.5–51.0)
Hemoglobin: 13.6 g/dL (ref 13.0–17.7)
Immature Grans (Abs): 0 10*3/uL (ref 0.0–0.1)
Immature Granulocytes: 0 %
Lymphocytes Absolute: 1 10*3/uL (ref 0.7–3.1)
Lymphs: 11 %
MCH: 29.1 pg (ref 26.6–33.0)
MCHC: 33.5 g/dL (ref 31.5–35.7)
MCV: 87 fL (ref 79–97)
Monocytes Absolute: 0.4 10*3/uL (ref 0.1–0.9)
Monocytes: 5 %
Neutrophils Absolute: 7.4 10*3/uL — ABNORMAL HIGH (ref 1.4–7.0)
Neutrophils: 81 %
Platelets: 252 10*3/uL (ref 150–450)
RBC: 4.68 x10E6/uL (ref 4.14–5.80)
RDW: 15.6 % — ABNORMAL HIGH (ref 12.3–15.4)
WBC: 9.1 10*3/uL (ref 3.4–10.8)

## 2017-10-19 LAB — IRON: Iron: 62 ug/dL (ref 38–169)

## 2017-10-19 LAB — URIC ACID: Uric Acid: 6.5 mg/dL (ref 3.7–8.6)

## 2017-10-19 LAB — MAGNESIUM: Magnesium: 2.3 mg/dL (ref 1.6–2.3)

## 2017-10-19 LAB — FOLATE: Folate: 5.2 ng/mL (ref >3.0)

## 2017-10-19 LAB — VITAMIN B12: Vitamin B-12: 266 pg/mL (ref 232–1245)

## 2017-10-22 NOTE — Progress Notes (Signed)
BP (!) 128/94   Pulse 78   Temp (!) 97.5 F (36.4 C) (Oral)   Ht 5' 8" (1.727 m)   Wt 272 lb (123.4 kg)   BMI 41.36 kg/m     Subjective:    Patient ID: [name], male    DOB: May 05, 1945, 72 y.o.   MRN: [id number]  HPI: [name] is a 72 y.o. male presenting on 10/18/2017 for multiple falls  This patient comes in with multiple problems.  His primary concern is his increased number of falls.  2006 he had a fall that caused a concussion.  Earlier in the year he had a fall where he broke his shoulder.  He is completing physical therapy at any pain at this time.  They did do some work on his walking at the physical therapist also.  He states he is up-to-date on his colonoscopy.  He sees endocrinology.  He thinks he has had a history of vitamin B12 deficiency in the past.  This is not been checked in some time.  We will draw all of these labs and past the month to his PCP.  Past Medical History:  Diagnosis Date  . Aortic atherosclerosis ([place]) followed by dr Trula Slade   2004  s/p  closure Penetrating atherosclerotic ulcer of infarenal aorta w/ endovascular stent graft  . Arthritis   . CKD (chronic kidney disease), stage III ([place])   . Closed fracture of head of humerus 07/2017   right shoulder from fall; 09-25-2017 per pt no surgical intervention, only wore sling, intermittant pain  . Coronary atherosclerosis of native coronary artery cardiologist-  dr Zigmund Gottron   positive myoview for ischemia 09-27-2009;  10-01-2009 per cardiac cath-- minimal nonobstructive CAD w/ 30% LAD   . ED (erectile dysfunction)   . Emphysema/COPD [facility])    followed by pcp--- last exacerbation 09-12-2017;  09-25-2017 per pt no cough, sob or congestion  . Essential hypertension, benign   . First degree heart block   . Frequency of urination   . GERD (gastroesophageal reflux disease)   . Gout    09-25-2017  per pt stable,  last episode 2015  . Heart murmur   . History of adenomatous polyp of  colon   . History of closed head injury 2005   per pt residual resolved  . History of external beam radiation therapy    completed 10/ 2015 for prostate cancer  . History of gastric ulcer 2014  . History of kidney stones   . History of pneumothorax    spontaneous pneumo treated w/ chest tube  . History of rib fracture 07/2017   from fall,  right side 6th,7th,8th  . Neuropathy   . OAB (overactive bladder)   . OSA (obstructive sleep apnea)    Intolerant of CPAP  . Prostate cancer [facility]) urologist-  dr wrenn/  oncologist-- dr Alen Blew and dr Tammi Klippel   dx 09-26-2011 via bx-- Stage T3b,N0,  Gleason 4+4, PSA 11.2 with METs to external iliac lymph node(resolved with ADT)-- treated w/ ADT ;   05/2013 staging work-up for rising PSA , started casodex and completed radiation therapy 10/ 2015;   rising PSA post treatment  . RLS (restless legs syndrome)   . Ureteral neocystostomy bleed    left side  . Urge urinary incontinence    Relevant past medical, surgical, family and social history reviewed and updated as indicated. Interim medical history since our last visit reviewed. Allergies and medications reviewed and updated.  DATA REVIEWED: CHART IN EPIC  Family History reviewed for pertinent findings.  Review of Systems  Constitutional: Positive for fatigue. Negative for appetite change, diaphoresis, fever and unexpected weight change.  Eyes: Negative for pain and visual disturbance.  Respiratory: Positive for shortness of breath. Negative for cough, chest tightness and wheezing.   Cardiovascular: Negative.  Negative for chest pain, palpitations and leg swelling.  Gastrointestinal: Negative.  Negative for abdominal pain, diarrhea, nausea and vomiting.  Genitourinary: Negative.   Skin: Negative.  Negative for color change and rash.  Neurological: Positive for weakness and numbness. Negative for dizziness, seizures, syncope, speech difficulty, light-headedness and headaches.  Psychiatric/Behavioral:  Negative.  Negative for decreased concentration.    Allergies as of 10/18/2017      Reactions   Carbidopa-levodopa Other (See Comments)   hallunications   Morphine Sulfate Nausea And Vomiting   Sulfacetamide Sodium Nausea And Vomiting      Medication List        Accurate as of 10/18/17 11:59 PM. Always use your most recent med list.          allopurinol 300 MG tablet Commonly known as:  ZYLOPRIM TAKE 1 TABLET BY MOUTH ONCE DAILY   amLODipine 10 MG tablet Commonly known as:  NORVASC TAKE 1 TABLET BY MOUTH ONCE DAILY   carvedilol 25 MG tablet Commonly known as:  COREG Take 1 tablet (25 mg total) by mouth 2 (two) times daily.   DULoxetine 30 MG capsule Commonly known as:  CYMBALTA TAKE 1 CAPSULE BY MOUTH ONCE DAILY   HYDROcodone-acetaminophen 5-325 MG tablet Commonly known as:  NORCO Take 1 tablet by mouth every 6 (six) hours as needed for moderate pain.   losartan 50 MG tablet Commonly known as:  COZAAR TAKE 1 TABLET BY MOUTH ONCE DAILY   pantoprazole 40 MG tablet Commonly known as:  PROTONIX TAKE 1 TABLET BY MOUTH TWICE DAILY          Objective:    BP (!) 128/94   Pulse 78   Temp (!) 97.5 F (36.4 C) (Oral)   Ht 5' 8" (1.727 m)   Wt 272 lb (123.4 kg)   BMI 41.36 kg/m    Allergies  Allergen Reactions  . Carbidopa-Levodopa Other (See Comments)    hallunications  . Morphine Sulfate Nausea And Vomiting  . Sulfacetamide Sodium Nausea And Vomiting    Wt Readings from Last 3 Encounters:  10/18/17 272 lb (123.4 kg)  09/27/17 271 lb 9.6 oz (123.2 kg)  09/26/17 273 lb (123.8 kg)    Physical Exam  Constitutional: He appears well-developed and well-nourished.  HENT:  Head: Normocephalic and atraumatic.  Eyes: Pupils are equal, round, and reactive to light. Conjunctivae and EOM are normal.  Neck: Normal range of motion. Neck supple.  Cardiovascular: Normal rate, regular rhythm and normal heart sounds.  Pulmonary/Chest: Effort normal and breath sounds  normal.  Abdominal: Soft. Bowel sounds are normal.  Musculoskeletal: Normal range of motion.  Skin: Skin is warm and dry.    Results for orders placed or performed in visit on 10/18/17  CBC with Differential/Platelet  Result Value Ref Range   WBC 9.1 3.4 - 10.8 x10E3/uL   RBC 4.68 4.14 - 5.80 x10E6/uL   Hemoglobin 13.6 13.0 - 17.7 g/dL   Hematocrit 40.6 37.5 - 51.0 %   MCV 87 79 - 97 fL   MCH 29.1 26.6 - 33.0 pg   MCHC 33.5 31.5 - 35.7 g/dL   RDW 15.6 (H) 12.3 -   15.4 %   Platelets 252 150 - 450 x10E3/uL   Neutrophils 81 Not Estab. %   Lymphs 11 Not Estab. %   Monocytes 5 Not Estab. %   Eos 3 Not Estab. %   Basos 0 Not Estab. %   Neutrophils Absolute 7.4 (H) 1.4 - 7.0 x10E3/uL   Lymphocytes Absolute 1.0 0.7 - 3.1 x10E3/uL   Monocytes Absolute 0.4 0.1 - 0.9 x10E3/uL   EOS (ABSOLUTE) 0.3 0.0 - 0.4 x10E3/uL   Basophils Absolute 0.0 0.0 - 0.2 x10E3/uL   Immature Granulocytes 0 Not Estab. %   Immature Grans (Abs) 0.0 0.0 - 0.1 x10E3/uL  CMP14+EGFR  Result Value Ref Range   Glucose 100 (H) 65 - 99 mg/dL   BUN 28 (H) 8 - 27 mg/dL   Creatinine, Ser 2.00 (H) 0.76 - 1.27 mg/dL   GFR calc non Af Amer 32 (L) >59 mL/min/1.73   GFR calc Af Amer 37 (L) >59 mL/min/1.73   BUN/Creatinine Ratio 14 10 - 24   Sodium 139 134 - 144 mmol/L   Potassium 3.7 3.5 - 5.2 mmol/L   Chloride 103 96 - 106 mmol/L   CO2 22 20 - 29 mmol/L   Calcium 9.1 8.6 - 10.2 mg/dL   Total Protein 7.1 6.0 - 8.5 g/dL   Albumin 4.4 3.5 - 4.8 g/dL   Globulin, Total 2.7 1.5 - 4.5 g/dL   Albumin/Globulin Ratio 1.6 1.2 - 2.2   Bilirubin Total 0.5 0.0 - 1.2 mg/dL   Alkaline Phosphatase 92 39 - 117 IU/L   AST 14 0 - 40 IU/L   ALT 14 0 - 44 IU/L  Sedimentation rate  Result Value Ref Range   Sed Rate 35 (H) 0 - 30 mm/hr  Bayer DCA Hb A1c Waived  Result Value Ref Range   HB A1C (BAYER DCA - WAIVED) 5.6 <7.0 %  Vitamin B12  Result Value Ref Range   Vitamin B-12 266 232 - 1,245 pg/mL  Uric acid  Result Value Ref Range     Uric Acid 6.5 3.7 - 8.6 mg/dL  Folate  Result Value Ref Range   Folate 5.2 >3.0 ng/mL  Iron  Result Value Ref Range   Iron 62 38 - 169 ug/dL  Magnesium  Result Value Ref Range   Magnesium 2.3 1.6 - 2.3 mg/dL      Assessment & Plan:   1. Prediabetes - Bayer DCA Hb A1c Waived  2. Falls frequently - CBC with Differential/Platelet - CMP14+EGFR - Sedimentation rate - Vitamin B12 - Uric acid - Folate - Iron - Magnesium  3. Other polyneuropathy - CBC with Differential/Platelet - CMP14+EGFR - Sedimentation rate - Bayer DCA Hb A1c Waived - Vitamin B12 - VITAMIN D 25 Hydroxy (Vit-D Deficiency, Fractures); Future - Uric acid - Folate - Iron - Magnesium  4. Numbness and tingling of both lower extremities - CBC with Differential/Platelet - CMP14+EGFR - Sedimentation rate - Vitamin B12 - Uric acid - Folate - Iron - Magnesium  5. Iron deficiency - Iron  6. Vitamin D deficiency - VITAMIN D 25 Hydroxy (Vit-D Deficiency, Fractures); Future   Continue all other maintenance medications as listed above.  Follow up plan: No follow-ups on file.  Educational handout given for survey   S.  PA-C [facility] [address]  [place], [place] [address] [phone]   10/22/2017, 1:57 PM  

## 2017-10-25 ENCOUNTER — Encounter (HOSPITAL_COMMUNITY): Payer: Self-pay | Admitting: Occupational Therapy

## 2017-10-25 ENCOUNTER — Ambulatory Visit (HOSPITAL_COMMUNITY): Payer: Medicare Other | Admitting: Occupational Therapy

## 2017-10-25 DIAGNOSIS — M25511 Pain in right shoulder: Secondary | ICD-10-CM | POA: Diagnosis not present

## 2017-10-25 DIAGNOSIS — R278 Other lack of coordination: Secondary | ICD-10-CM | POA: Diagnosis not present

## 2017-10-25 DIAGNOSIS — R29898 Other symptoms and signs involving the musculoskeletal system: Secondary | ICD-10-CM

## 2017-10-25 DIAGNOSIS — M25611 Stiffness of right shoulder, not elsewhere classified: Secondary | ICD-10-CM | POA: Diagnosis not present

## 2017-10-25 NOTE — Therapy (Signed)
[place], [place], [address] Phone: [phone]   Fax:  [phone]  Occupational Therapy Treatment  Patient Details  Name: [name] MRN: [id number] Date of Birth: 13-Apr-1945 Referring Provider: Dr. Victorino December   Encounter Date: 10/25/2017  OT End of Session - 10/25/17 1450    Visit Number  7    Number of Visits  24    Date for OT Re-Evaluation  12/07/17 mini reassess due on 8/13    Authorization Type  Primary insurance is Medicare; secondary is BCBC; visit limit of 60 with 7 used.    Authorization Time Period  per calendar year    Authorization - Visit Number  13    Authorization - Number of Visits  50    OT Start Time  2355    OT Stop Time  1344    OT Time Calculation (min)  46 min    Activity Tolerance  Patient tolerated treatment well;Patient limited by pain    Behavior During Therapy  WFL for tasks assessed/performed       Past Medical History:  Diagnosis Date  . Aortic atherosclerosis ([place]) followed by dr Trula Slade   2004  s/p  closure Penetrating atherosclerotic ulcer of infarenal aorta w/ endovascular stent graft  . Arthritis   . CKD (chronic kidney disease), stage III ([place])   . Closed fracture of head of humerus 07/2017   right shoulder from fall; 09-25-2017 per pt no surgical intervention, only wore sling, intermittant pain  . Coronary atherosclerosis of native coronary artery cardiologist-  dr Zigmund Gottron   positive myoview for ischemia 09-27-2009;  10-01-2009 per cardiac cath-- minimal nonobstructive CAD w/ 30% LAD   . ED (erectile dysfunction)   . Emphysema/COPD [facility])    followed by pcp--- last exacerbation 09-12-2017;  09-25-2017 per pt no cough, sob or congestion  . Essential hypertension, benign   . First degree heart block   . Frequency of urination   . GERD (gastroesophageal reflux disease)   . Gout    09-25-2017  per pt stable,  last episode 2015  . Heart murmur   . History of  adenomatous polyp of colon   . History of closed head injury 2005   per pt residual resolved  . History of external beam radiation therapy    completed 10/ 2015 for prostate cancer  . History of gastric ulcer 2014  . History of kidney stones   . History of pneumothorax    spontaneous pneumo treated w/ chest tube  . History of rib fracture 07/2017   from fall,  right side 6th,7th,8th  . Neuropathy   . OAB (overactive bladder)   . OSA (obstructive sleep apnea)    Intolerant of CPAP  . Prostate cancer [facility]) urologist-  dr wrenn/  oncologist-- dr Alen Blew and dr Tammi Klippel   dx 09-26-2011 via bx-- Stage T3b,N0,  Gleason 4+4, PSA 11.2 with METs to external iliac lymph node(resolved with ADT)-- treated w/ ADT ;   05/2013 staging work-up for rising PSA , started casodex and completed radiation therapy 10/ 2015;   rising PSA post treatment  . RLS (restless legs syndrome)   . Ureteral neocystostomy bleed    left side  . Urge urinary incontinence     Past Surgical History:  Procedure Laterality Date  . ABDOMINAL AORTIC ENDOVASCULAR STENT GRAFT  09-13-2007    dr [name]  [facility]   closure penetrating atherosclerotic ulcer of infarenal aorta with stent graft  .  CARDIAC CATHETERIZATION  2005   per pt normal (done in [place])  . CARDIAC CATHETERIZATION  10/06/2009    dr cooper   minimal nonobstructive CAD w/30% LAD otherwise normal coronaries, LVEDP 68mmHg  . CARDIOVASCULAR STRESS TEST  09/27/2009    dr Aundra Dubin   lexiscan nuclear study w/ moderate reversible inferior perfusion defect ischemia,  ef 60% (cardiac cath scheduled)  . CHEST TUBE INSERTION  1989   "collapsed lung"due to injury  . CIRCUMCISION  09/26/2011   Procedure: CIRCUMCISION ADULT;  Surgeon: [name], MD;  Location: [facility];  Service: Urology;  Laterality: N/A;  . COLONOSCOPY W/ POLYPECTOMY    . COLONOSCOPY WITH PROPOFOL N/A 04/27/2016   Procedure: COLONOSCOPY WITH PROPOFOL;  Surgeon: [name], MD;  [facility];   Service: Endoscopy;  Laterality: N/A;  1115-moved to 845 per Ginger  . CYSTOSCOPY  09/26/2011   Procedure: CYSTOSCOPY;  Surgeon: [name], MD;  Location: [facility];  Service: Urology;  Laterality: N/A;  . CYSTOSCOPY/RETROGRADE/URETEROSCOPY Left 09/27/2017   Procedure: CYSTOSCOPYLEFT Marlow Baars AND RENAL WASHINGS;  Surgeon: [name], MD;  [facility];  Service: Urology;  Laterality: Left;  . ESOPHAGOGASTRODUODENOSCOPY (EGD) WITH PROPOFOL N/A 04/27/2016   Procedure: ESOPHAGOGASTRODUODENOSCOPY (EGD) WITH PROPOFOL;  Surgeon: [name], MD;  [facility]: [facility];  Service: Endoscopy;  Laterality: N/A;  . EXTRACORPOREAL SHOCK WAVE LITHOTRIPSY  yrs ago  . FRACTURE SURGERY  child   Bilateral lower arms   . KNEE ARTHROSCOPY Right 2013  . MALONEY DILATION N/A 04/27/2016   Procedure: Venia Minks DILATION;  Surgeon: [name], MD;  [facility];  Service: Endoscopy;  Laterality: N/A;  . PARTIAL KNEE ARTHROPLASTY Right 04/26/2015   Procedure: RIGHT KNEE MEDIAL UNICOMPARTMENTAL ARTHROPLASTY;  Surgeon: Gaynelle Arabian, MD;  Location: [facility];  Service: Orthopedics;  Laterality: Right;  . POLYPECTOMY  04/27/2016   Procedure: POLYPECTOMY;  Surgeon: [name], MD;  [facility];  Service: Endoscopy;;  cecal , ascending, descending, and sigmoid polypectomies  . PROSTATE BIOPSY  09/26/2011   Procedure: BIOPSY TRANSRECTAL ULTRASONIC PROSTATE (TUBP);  Surgeon: [name], MD;  Location: [facility];  Service: Urology;  Laterality: N/A;     . TRANSTHORACIC ECHOCARDIOGRAM  01-07-2013   dr [name]   ef 60-65%,  grade 1 diastolic dysfunction/  mild AR without stenosis/  mild LAE/  mild to moderate calcificed MV annulus without regurg. or stenosis/  . UMBILICAL HERNIA REPAIR  yrs ago    There were no vitals filed for this visit.  Subjective Assessment - 10/25/17 1252    Subjective   S: I don't know if this is helping or not.     Currently in Pain?   Yes    Pain Score  6     Pain Location  Shoulder    Pain Orientation  Right    Pain Descriptors / Indicators  Aching;Sore    Pain Type  Acute pain    Pain Radiating Towards  N/A    Pain Onset  More than a month ago    Pain Frequency  Constant    Aggravating Factors   use and movement    Pain Relieving Factors  rest, occasional pain medication    Effect of Pain on Daily Activities  max effect on ADLs    Multiple Pain Sites  No         [facility] OT Assessment - 10/25/17 1249  Assessment   Medical Diagnosis  right shoulder pain secondary to right proximal humerus fracture      Precautions   Precautions  Shoulder    Type of Shoulder Precautions  Begin gentle P/ROM and AA/ROM; lifting limit with no more than 2#                OT Treatments/Exercises (OP) - 10/25/17 1251      Exercises   Exercises  Shoulder      Shoulder Exercises: Supine   Protraction  PROM;5 reps;AAROM;10 reps    Horizontal ABduction  PROM;5 reps;AAROM;10 reps    External Rotation  PROM;5 reps;AAROM;10 reps    Internal Rotation  PROM;5 reps;AAROM;10 reps    Flexion  PROM;5 reps;AAROM;10 reps    ABduction  PROM;5 reps;AAROM;10 reps      Shoulder Exercises: Pulleys   Flexion  1 minute      Manual Therapy   Manual Therapy  Myofascial release    Manual therapy comments  Myofascial release performed seperate from other exercises.    Myofascial Release  Myofascial release performed on upper arm, trapezius, and scapularis region to reduce fascial restrictions in order to increase ROM and decrease pain.               OT Short Term Goals - 10/16/17 1249      OT SHORT TERM GOAL #1   Title  Patient will be educated and independent with HEP to faciliate progress in therapy and allow him to return to using his RUE as his dominant extremity for all daily tasks.    Time  6    Period  Weeks    Status  On-going      OT SHORT TERM GOAL #2   Title  Patient will increase RUE strength to 3+/5 in  order to complete daily and leisure tasks.    Time  6    Period  Weeks    Status  On-going      OT SHORT TERM GOAL #3   Title   Patient will increase RUE P/ROM to [facility] to increase ability to complete ADLs and be able to go fishing.    Time  6    Period  Weeks    Status  Achieved      OT SHORT TERM GOAL #4   Title   Patient will decrease pain level in RUE during daily tasks to 5/10 or less.      Time  6    Period  Weeks    Status  On-going      OT SHORT TERM GOAL #5   Title  Patient will decrease fascial restrictions in RUE to min amount or less in order to increase functional mobility needed to complete functional reaching tasks.      Time  6    Period  Weeks    Status  On-going        OT Long Term Goals - 09/18/17 1051      OT LONG TERM GOAL #1   Title  Patient will return to highest level of independence with all daily and leisure tasks while using her RUE as his dominant.      Time  12    Period  Weeks    Status  On-going      OT LONG TERM GOAL #2   Title  Patient will increase his RUE strength to 4/5 in order to return to be able to complete all daily routine tasks.  Time  12    Period  Weeks    Status  On-going      OT LONG TERM GOAL #3   Title   Patient will increase RUE A/ROM to [facility] to increase ability to complete functional reaching tasks above head.     Time  12    Period  Weeks    Status  On-going      OT LONG TERM GOAL #4   Title  Patient will report a decrease in pain level for RUE while completing daily tasks and fishing to 3/10 or less.      Time  12    Period  Weeks    Status  On-going      OT LONG TERM GOAL #5   Title  Patient will decrease fascial restrictions to trace amount or less in RUE in order to increase functional mobility needed to complete tasks.      Time  12    Period  Weeks    Status  On-going            Plan - 10/25/17 1450    Clinical Impression Statement  A: Continued with manual therapy to address fascial  restrictions limiting ROM in RUE. Pt reporting increased soreness today, did have a fall acouple of days ago. Pt able to complete supine AA/ROM, assist for unweighting during abduction. Pt unable to complete in sitting due to pain. Added pulleys in flexion without difficulty. Verbal cuing for form and technique during session.     Plan  P: Continue with manual therapy and attempt to resume AA/ROM in sitting. Add pulleys in abduction       Patient will benefit from skilled therapeutic intervention in order to improve the following deficits and impairments:  Decreased range of motion, Decreased endurance, Decreased activity tolerance, Increased fascial restrictions, Impaired UE functional use, Pain, Decreased mobility, Decreased strength  Visit Diagnosis: Acute pain of right shoulder  Stiffness of right shoulder, not elsewhere classified  Other symptoms and signs involving the musculoskeletal system    Problem List Patient Active Problem List   Diagnosis Date Noted  . Dysphagia 07/10/2016  . Diarrhea 07/10/2016  . BMI 40.0-44.9, adult ([place]) 05/31/2015  . Neuropathy 03/17/2015  . Prostate cancer ([place]) 06/30/2013  . Abdominal aneurysm without mention of rupture 02/19/2013  . Need for prophylactic vaccination and inoculation against influenza 02/03/2013  . ANXIETY 01/14/2013  . OBESITY NOS 01/14/2013  . VITAMIN B12 DEFICIENCY 01/14/2013  . TOBACCO ABUSE 01/14/2013  . PTSD 01/14/2013  . COPD (chronic obstructive pulmonary disease) ([place])   . OSA (obstructive sleep apnea) 08/14/2012  . RLS (restless legs syndrome) 08/14/2012  . Arthritis of both knees 08/14/2012  . Prediabetes 08/14/2012  . Nephrolithiasis 08/14/2012  . Aortic atherosclerosis ([place]) 02/19/2012  . Coronary atherosclerosis of native coronary artery 03/23/2010  . CHRONIC KIDNEY DISEASE STAGE II ([facility]) 10/13/2009  . GERD 07/30/2008  . BENIGN PROSTATIC HYPERTROPHY, WITH OBSTRUCTION 02/20/2008  . Hyperlipidemia 01/31/2007   . GOUT 01/31/2007  . Essential hypertension, benign 01/31/2007   Guadelupe Sabin, OTR/L  [phone] 10/25/2017, 2:53 PM  [facility] [address] [place], [place], [address] Phone: [phone]   Fax:  [phone]  Name: [name] MRN: [id number] Date of Birth: October 09, 1945

## 2017-10-30 ENCOUNTER — Telehealth: Payer: Self-pay | Admitting: Family Medicine

## 2017-10-30 ENCOUNTER — Other Ambulatory Visit: Payer: Self-pay

## 2017-10-30 ENCOUNTER — Ambulatory Visit (HOSPITAL_COMMUNITY): Payer: Medicare Other

## 2017-10-30 ENCOUNTER — Encounter (HOSPITAL_COMMUNITY): Payer: Self-pay

## 2017-10-30 DIAGNOSIS — R278 Other lack of coordination: Secondary | ICD-10-CM | POA: Diagnosis not present

## 2017-10-30 DIAGNOSIS — N289 Disorder of kidney and ureter, unspecified: Secondary | ICD-10-CM

## 2017-10-30 DIAGNOSIS — M25611 Stiffness of right shoulder, not elsewhere classified: Secondary | ICD-10-CM | POA: Diagnosis not present

## 2017-10-30 DIAGNOSIS — M25511 Pain in right shoulder: Secondary | ICD-10-CM

## 2017-10-30 DIAGNOSIS — R29898 Other symptoms and signs involving the musculoskeletal system: Secondary | ICD-10-CM | POA: Diagnosis not present

## 2017-10-30 NOTE — Therapy (Addendum)
[facility] [place], [place], [address] Phone: [phone]   Fax:  [phone]  Occupational Therapy Treatment  Patient Details  Name: [name] MRN: [id number] Date of Birth: [birth year] Referring Provider: Dr. Victorino December   Encounter Date: 10/30/2017  OT End of Session - 10/30/17 1313    Visit Number  8    Number of Visits  24    Date for OT Re-Evaluation  12/07/17    Authorization Type  Primary insurance is Medicare; secondary is BCBC; visit limit of 60 with 7 used.    Authorization Time Period  per calendar year    Authorization - Visit Number  14    Authorization - Number of Visits  50    OT Start Time  1255    OT Stop Time  1344    OT Time Calculation (min)  49 min    Activity Tolerance  Patient tolerated treatment well;Patient limited by pain    Behavior During Therapy  WFL for tasks assessed/performed       Past Medical History:  Diagnosis Date  . Aortic atherosclerosis ([place]) followed by dr Trula Slade   2004  s/p  closure Penetrating atherosclerotic ulcer of infarenal aorta w/ endovascular stent graft  . Arthritis   . CKD (chronic kidney disease), stage III ([place])   . Closed fracture of head of humerus 07/2017   right shoulder from fall; 09-25-2017 per pt no surgical intervention, only wore sling, intermittant pain  . Coronary atherosclerosis of native coronary artery cardiologist-  dr Zigmund Gottron   positive myoview for ischemia 09-27-2009;  10-01-2009 per cardiac cath-- minimal nonobstructive CAD w/ 30% LAD   . ED (erectile dysfunction)   . Emphysema/COPD [facility])    followed by pcp--- last exacerbation 09-12-2017;  09-25-2017 per pt no cough, sob or congestion  . Essential hypertension, benign   . First degree heart block   . Frequency of urination   . GERD (gastroesophageal reflux disease)   . Gout    09-25-2017  per pt stable,  last episode 2015  . Heart murmur   . History of adenomatous polyp of  colon   . History of closed head injury 2005   per pt residual resolved  . History of external beam radiation therapy    completed 10/ 2015 for prostate cancer  . History of gastric ulcer 2014  . History of kidney stones   . History of pneumothorax    spontaneous pneumo treated w/ chest tube  . History of rib fracture 07/2017   from fall,  right side 6th,7th,8th  . Neuropathy   . OAB (overactive bladder)   . OSA (obstructive sleep apnea)    Intolerant of CPAP  . Prostate cancer [facility]) urologist-  dr wrenn/  oncologist-- dr Alen Blew and dr Tammi Klippel   dx 09-26-2011 via bx-- Stage T3b,N0,  Gleason 4+4, PSA 11.2 with METs to external iliac lymph node(resolved with ADT)-- treated w/ ADT ;   05/2013 staging work-up for rising PSA , started casodex and completed radiation therapy 10/ 2015;   rising PSA post treatment  . RLS (restless legs syndrome)   . Ureteral neocystostomy bleed    left side  . Urge urinary incontinence     Past Surgical History:  Procedure Laterality Date  . ABDOMINAL AORTIC ENDOVASCULAR STENT GRAFT  09-13-2007    dr [name]  [facility]   closure penetrating atherosclerotic ulcer of infarenal aorta with stent graft  . CARDIAC CATHETERIZATION  2005   per pt normal (done in [place])  . CARDIAC CATHETERIZATION  10/06/2009    dr cooper   minimal nonobstructive CAD w/30% LAD otherwise normal coronaries, LVEDP 59mmHg  . CARDIOVASCULAR STRESS TEST  09/27/2009    dr Aundra Dubin   lexiscan nuclear study w/ moderate reversible inferior perfusion defect ischemia,  ef 60% (cardiac cath scheduled)  . CHEST TUBE INSERTION  1989   "collapsed lung"due to injury  . CIRCUMCISION  09/26/2011   Procedure: CIRCUMCISION ADULT;  Surgeon: [name], MD;  Location: [facility];  Service: Urology;  Laterality: N/A;  . COLONOSCOPY W/ POLYPECTOMY    . COLONOSCOPY WITH PROPOFOL N/A 04/27/2016   Procedure: COLONOSCOPY WITH PROPOFOL;  Surgeon: [name], MD;  [facility];  Service: Endoscopy;   Laterality: N/A;  1115-moved to 845 per Ginger  . CYSTOSCOPY  09/26/2011   Procedure: CYSTOSCOPY;  Surgeon: [name], MD;  Location: [facility];  Service: Urology;  Laterality: N/A;  . CYSTOSCOPY/RETROGRADE/URETEROSCOPY Left 09/27/2017   Procedure: CYSTOSCOPYLEFT Marlow Baars AND RENAL WASHINGS;  Surgeon: [name], MD;  Location: [facility];  Service: Urology;  Laterality: Left;  . ESOPHAGOGASTRODUODENOSCOPY (EGD) WITH PROPOFOL N/A 04/27/2016   Procedure: ESOPHAGOGASTRODUODENOSCOPY (EGD) WITH PROPOFOL;  Surgeon: [name], MD;  [facility];  Service: Endoscopy;  Laterality: N/A;  . EXTRACORPOREAL SHOCK WAVE LITHOTRIPSY  yrs ago  . FRACTURE SURGERY  child   Bilateral lower arms   . KNEE ARTHROSCOPY Right 2013  . MALONEY DILATION N/A 04/27/2016   Procedure: Venia Minks DILATION;  Surgeon: [name], MD;  [facility];  Service: Endoscopy;  Laterality: N/A;  . PARTIAL KNEE ARTHROPLASTY Right 04/26/2015   Procedure: RIGHT KNEE MEDIAL UNICOMPARTMENTAL ARTHROPLASTY;  Surgeon: Gaynelle Arabian, MD;  Location: [facility];  Service: Orthopedics;  Laterality: Right;  . POLYPECTOMY  04/27/2016   Procedure: POLYPECTOMY;  Surgeon: [name], MD;  [facility];  Service: Endoscopy;;  cecal , ascending, descending, and sigmoid polypectomies  . PROSTATE BIOPSY  09/26/2011   Procedure: BIOPSY TRANSRECTAL ULTRASONIC PROSTATE (TUBP);  Surgeon: [name], MD;  Location: [facility];  Service: Urology;  Laterality: N/A;     . TRANSTHORACIC ECHOCARDIOGRAM  01-07-2013   dr [name]   ef 60-65%,  grade 1 diastolic dysfunction/  mild AR without stenosis/  mild LAE/  mild to moderate calcificed MV annulus without regurg. or stenosis/  . UMBILICAL HERNIA REPAIR  yrs ago    There were no vitals filed for this visit.  Subjective Assessment - 10/30/17 1250    Subjective   S: The arm is really terrible. I've been thinking about making an earlier appointment  with the doctor.    Currently in Pain?  Yes    Pain Score  7     Pain Location  Shoulder    Pain Orientation  Right    Pain Descriptors / Indicators  Aching;Sore    Pain Type  Acute pain    Pain Radiating Towards  N/A    Pain Onset  More than a month ago    Pain Frequency  Constant    Aggravating Factors   use and movement    Pain Relieving Factors  rest, occassional pain medication    Effect of Pain on Daily Activities  max effect on ADLs    Multiple Pain Sites  No         [facility] OT Assessment - 10/30/17 1252  Assessment   Medical Diagnosis  right shoulder pain secondary to right proximal humerus fracture      Precautions   Precautions  Shoulder    Type of Shoulder Precautions  Begin gentle P/ROM and AA/ROM; lifting limit with no more than 2#                OT Treatments/Exercises (OP) - 10/30/17 1252      Exercises   Exercises  Shoulder      Shoulder Exercises: Supine   Protraction  PROM;5 reps    Horizontal ABduction  PROM;5 reps    External Rotation  PROM;5 reps    Internal Rotation  PROM;5 reps    Flexion  PROM;5 reps    ABduction  PROM;5 reps      Shoulder Exercises: Pulleys   Flexion  1 minute    ABduction  1 minute      Modalities   Modalities  Electrical Stimulation      Electrical Stimulation   Electrical Stimulation Location  right upper arm    Electrical Stimulation Action  interferential    Electrical Stimulation Parameters  10 minutes, 11CV    Electrical Stimulation Goals  Pain      Manual Therapy   Manual Therapy  Myofascial release    Manual therapy comments  Myofascial release performed seperate from other exercises.    Myofascial Release  Myofascial release performed on upper arm, trapezius, and scapularis region to reduce fascial restrictions in order to increase ROM and decrease pain.               OT Short Term Goals - 10/30/17 1315      OT SHORT TERM GOAL #1   Title  Patient will be educated and independent with  HEP to faciliate progress in therapy and allow him to return to using his RUE as his dominant extremity for all daily tasks.    Time  6    Period  Weeks    Status  On-going      OT SHORT TERM GOAL #2   Title  Patient will increase RUE strength to 3+/5 in order to complete daily and leisure tasks.    Time  6    Period  Weeks    Status  On-going      OT SHORT TERM GOAL #3   Title   Patient will increase RUE P/ROM to [facility] to increase ability to complete ADLs and be able to go fishing.    Time  6    Period  Weeks      OT SHORT TERM GOAL #4   Title   Patient will decrease pain level in RUE during daily tasks to 5/10 or less.      Time  6    Period  Weeks    Status  On-going      OT SHORT TERM GOAL #5   Title  Patient will decrease fascial restrictions in RUE to min amount or less in order to increase functional mobility needed to complete functional reaching tasks.      Time  6    Period  Weeks    Status  On-going        OT Long Term Goals - 09/18/17 1051      OT LONG TERM GOAL #1   Title  Patient will return to highest level of independence with all daily and leisure tasks while using her RUE as his dominant.      Time  12    Period  Weeks    Status  On-going      OT LONG TERM GOAL #2   Title  Patient will increase his RUE strength to 4/5 in order to return to be able to complete all daily routine tasks.    Time  12    Period  Weeks    Status  On-going      OT LONG TERM GOAL #3   Title   Patient will increase RUE A/ROM to [facility] to increase ability to complete functional reaching tasks above head.     Time  12    Period  Weeks    Status  On-going      OT LONG TERM GOAL #4   Title  Patient will report a decrease in pain level for RUE while completing daily tasks and fishing to 3/10 or less.      Time  12    Period  Weeks    Status  On-going      OT LONG TERM GOAL #5   Title  Patient will decrease fascial restrictions to trace amount or less in RUE in order to increase  functional mobility needed to complete tasks.      Time  12    Period  Weeks    Status  On-going            Plan - 10/30/17 1318    Clinical Impression Statement  A: Continued with manual therapy to address fascial restrictions limiting ROM in RUE. Minimal fascial restrictions palpated in anterior upper arm. Patient resumed with seated AA/ROM this session requiring extra time due to pain. Patient able to reach 80% ROM with AA/ROM exercises. Pulleys added in abduction. Verbal cuing for form and technique during session. ES performed for pain management. Patient reports no pain following ES.   Plan  P: Continue with manual therapy and ES. Begin adding in A/ROM in supine. Continue with AA/ROM in seated. Provide information on purchasing an ES unit for home.      Patient will benefit from skilled therapeutic intervention in order to improve the following deficits and impairments:  Decreased range of motion, Decreased endurance, Decreased activity tolerance, Increased fascial restrictions, Impaired UE functional use, Pain, Decreased mobility, Decreased strength  Visit Diagnosis: Acute pain of right shoulder  Stiffness of right shoulder, not elsewhere classified  Other symptoms and signs involving the musculoskeletal system    Problem List Patient Active Problem List   Diagnosis Date Noted  . Dysphagia 07/10/2016  . Diarrhea 07/10/2016  . BMI 40.0-44.9, adult ([place]) 05/31/2015  . Neuropathy 03/17/2015  . Prostate cancer ([place]) 06/30/2013  . Abdominal aneurysm without mention of rupture 02/19/2013  . Need for prophylactic vaccination and inoculation against influenza 02/03/2013  . ANXIETY 01/14/2013  . OBESITY NOS 01/14/2013  . VITAMIN B12 DEFICIENCY 01/14/2013  . TOBACCO ABUSE 01/14/2013  . PTSD 01/14/2013  . COPD (chronic obstructive pulmonary disease) ([place])   . OSA (obstructive sleep apnea) 08/14/2012  . RLS (restless legs syndrome) 08/14/2012  . Arthritis of both knees  08/14/2012  . Prediabetes 08/14/2012  . Nephrolithiasis 08/14/2012  . Aortic atherosclerosis ([place]) 02/19/2012  . Coronary atherosclerosis of native coronary artery 03/23/2010  . CHRONIC KIDNEY DISEASE STAGE II ([facility]) 10/13/2009  . GERD 07/30/2008  . BENIGN PROSTATIC HYPERTROPHY, WITH OBSTRUCTION 02/20/2008  . Hyperlipidemia 01/31/2007  . GOUT 01/31/2007  . Essential hypertension, benign 01/31/2007    Roderic Palau, OT student 10/30/2017, 1:37 PM  [place]  [facility] [address] [place], [place], [address] Phone: [phone]   Fax:  [phone]  Name: [name] MRN: [id number] Date of Birth: 1945/11/07

## 2017-10-31 ENCOUNTER — Telehealth (HOSPITAL_COMMUNITY): Payer: Self-pay

## 2017-10-31 NOTE — Telephone Encounter (Signed)
Yes have him come in and recheck his renal function either this week or next

## 2017-10-31 NOTE — Telephone Encounter (Signed)
Dr. Stann Mainland office just called and wants to see the patient tomorrow. He will be here next Tuesday

## 2017-11-01 ENCOUNTER — Ambulatory Visit (HOSPITAL_COMMUNITY): Payer: Medicare Other

## 2017-11-01 DIAGNOSIS — S42291D Other displaced fracture of upper end of right humerus, subsequent encounter for fracture with routine healing: Secondary | ICD-10-CM | POA: Diagnosis not present

## 2017-11-01 NOTE — Telephone Encounter (Signed)
Pt aware of MD recommendations and voiced understanding. Future CMP order placed.

## 2017-11-02 ENCOUNTER — Other Ambulatory Visit: Payer: Self-pay | Admitting: *Deleted

## 2017-11-02 DIAGNOSIS — E538 Deficiency of other specified B group vitamins: Secondary | ICD-10-CM

## 2017-11-02 MED ORDER — CYANOCOBALAMIN 1000 MCG/ML IJ SOLN
1000.0000 ug | INTRAMUSCULAR | Status: AC
  Administered 2017-11-05 – 2018-02-12 (×4): 1000 ug via INTRAMUSCULAR

## 2017-11-05 ENCOUNTER — Ambulatory Visit (INDEPENDENT_AMBULATORY_CARE_PROVIDER_SITE_OTHER): Payer: Medicare Other | Admitting: *Deleted

## 2017-11-05 ENCOUNTER — Ambulatory Visit: Payer: Medicare Other

## 2017-11-05 DIAGNOSIS — E538 Deficiency of other specified B group vitamins: Secondary | ICD-10-CM

## 2017-11-06 ENCOUNTER — Telehealth (HOSPITAL_COMMUNITY): Payer: Self-pay | Admitting: Family Medicine

## 2017-11-06 ENCOUNTER — Ambulatory Visit (HOSPITAL_COMMUNITY): Payer: Medicare Other

## 2017-11-06 NOTE — Telephone Encounter (Signed)
11/06/17  pt cx via the phone tree.. I called to confirm and he did want to cx - [place]

## 2017-11-08 ENCOUNTER — Ambulatory Visit (HOSPITAL_COMMUNITY): Payer: Medicare Other | Admitting: Occupational Therapy

## 2017-11-08 ENCOUNTER — Telehealth (HOSPITAL_COMMUNITY): Payer: Self-pay | Admitting: Family Medicine

## 2017-11-08 NOTE — Telephone Encounter (Signed)
11/08/17  Tuesday appt was also cx because [place]

## 2017-11-12 ENCOUNTER — Ambulatory Visit (HOSPITAL_COMMUNITY): Payer: Medicare Other | Admitting: Occupational Therapy

## 2017-11-12 ENCOUNTER — Telehealth (HOSPITAL_COMMUNITY): Payer: Self-pay | Admitting: Family Medicine

## 2017-11-12 NOTE — Telephone Encounter (Signed)
11/12/17  pt cx via the phone tree

## 2017-11-13 ENCOUNTER — Other Ambulatory Visit: Payer: Self-pay | Admitting: Family

## 2017-11-13 DIAGNOSIS — G6289 Other specified polyneuropathies: Secondary | ICD-10-CM

## 2017-11-15 ENCOUNTER — Ambulatory Visit (HOSPITAL_COMMUNITY): Payer: Medicare Other | Attending: Orthopedic Surgery

## 2017-11-15 ENCOUNTER — Encounter (HOSPITAL_COMMUNITY): Payer: Self-pay

## 2017-11-15 DIAGNOSIS — M25511 Pain in right shoulder: Secondary | ICD-10-CM | POA: Insufficient documentation

## 2017-11-15 DIAGNOSIS — M25611 Stiffness of right shoulder, not elsewhere classified: Secondary | ICD-10-CM | POA: Diagnosis not present

## 2017-11-15 DIAGNOSIS — R29898 Other symptoms and signs involving the musculoskeletal system: Secondary | ICD-10-CM | POA: Diagnosis not present

## 2017-11-15 NOTE — Therapy (Signed)
[place], [place], [address] Phone: [phone]   Fax:  [phone]  Occupational Therapy Treatment  Patient Details  Name: [name] MRN: [id number] Date of Birth: 1945/09/07 Referring Provider: Dr. Victorino December   Encounter Date: 11/15/2017  OT End of Session - 11/15/17 1447    Visit Number  9    Number of Visits  24    Date for OT Re-Evaluation  12/07/17    Authorization Type  Primary insurance is Medicare; secondary is BCBC; visit limit of 60 with 7 used.    Authorization Time Period  per calendar year    Authorization - Visit Number  15    Authorization - Number of Visits  50    OT Start Time  3474    OT Stop Time  1355    OT Time Calculation (min)  50 min    Activity Tolerance  Patient tolerated treatment well    Behavior During Therapy  WFL for tasks assessed/performed       Past Medical History:  Diagnosis Date  . Aortic atherosclerosis ([place]) followed by dr Trula Slade   2004  s/p  closure Penetrating atherosclerotic ulcer of infarenal aorta w/ endovascular stent graft  . Arthritis   . CKD (chronic kidney disease), stage III ([place])   . Closed fracture of head of humerus 07/2017   right shoulder from fall; 09-25-2017 per pt no surgical intervention, only wore sling, intermittant pain  . Coronary atherosclerosis of native coronary artery cardiologist-  dr Zigmund Gottron   positive myoview for ischemia 09-27-2009;  10-01-2009 per cardiac cath-- minimal nonobstructive CAD w/ 30% LAD   . ED (erectile dysfunction)   . Emphysema/COPD [facility])    followed by pcp--- last exacerbation 09-12-2017;  09-25-2017 per pt no cough, sob or congestion  . Essential hypertension, benign   . First degree heart block   . Frequency of urination   . GERD (gastroesophageal reflux disease)   . Gout    09-25-2017  per pt stable,  last episode 2015  . Heart murmur   . History of adenomatous polyp of colon   . History of closed  head injury 2005   per pt residual resolved  . History of external beam radiation therapy    completed 10/ 2015 for prostate cancer  . History of gastric ulcer 2014  . History of kidney stones   . History of pneumothorax    spontaneous pneumo treated w/ chest tube  . History of rib fracture 07/2017   from fall,  right side 6th,7th,8th  . Neuropathy   . OAB (overactive bladder)   . OSA (obstructive sleep apnea)    Intolerant of CPAP  . Prostate cancer [facility]) urologist-  dr wrenn/  oncologist-- dr Alen Blew and dr Tammi Klippel   dx 09-26-2011 via bx-- Stage T3b,N0,  Gleason 4+4, PSA 11.2 with METs to external iliac lymph node(resolved with ADT)-- treated w/ ADT ;   05/2013 staging work-up for rising PSA , started casodex and completed radiation therapy 10/ 2015;   rising PSA post treatment  . RLS (restless legs syndrome)   . Ureteral neocystostomy bleed    left side  . Urge urinary incontinence     Past Surgical History:  Procedure Laterality Date  . ABDOMINAL AORTIC ENDOVASCULAR STENT GRAFT  09-13-2007    dr [name]  [facility]   closure penetrating atherosclerotic ulcer of infarenal aorta with stent graft  . CARDIAC CATHETERIZATION  2005  per pt normal (done in [place])  . CARDIAC CATHETERIZATION  10/06/2009    dr cooper   minimal nonobstructive CAD w/30% LAD otherwise normal coronaries, LVEDP 37mmHg  . CARDIOVASCULAR STRESS TEST  09/27/2009    dr Aundra Dubin   lexiscan nuclear study w/ moderate reversible inferior perfusion defect ischemia,  ef 60% (cardiac cath scheduled)  . CHEST TUBE INSERTION  1989   "collapsed lung"due to injury  . CIRCUMCISION  09/26/2011   Procedure: CIRCUMCISION ADULT;  Surgeon: [name], MD;  Location: [facility];  Service: Urology;  Laterality: N/A;  . COLONOSCOPY W/ POLYPECTOMY    . COLONOSCOPY WITH PROPOFOL N/A 04/27/2016   Procedure: COLONOSCOPY WITH PROPOFOL;  Surgeon: [name], MD;  [facility];  Service: Endoscopy;  Laterality: N/A;  1115-moved to  845 per Ginger  . CYSTOSCOPY  09/26/2011   Procedure: CYSTOSCOPY;  Surgeon: [name], MD;  Location: [facility];  Service: Urology;  Laterality: N/A;  . CYSTOSCOPY/RETROGRADE/URETEROSCOPY Left 09/27/2017   Procedure: CYSTOSCOPYLEFT Marlow Baars AND RENAL WASHINGS;  Surgeon: [name], MD;  Location: [facility];  Service: Urology;  Laterality: Left;  . ESOPHAGOGASTRODUODENOSCOPY (EGD) WITH PROPOFOL N/A 04/27/2016   Procedure: ESOPHAGOGASTRODUODENOSCOPY (EGD) WITH PROPOFOL;  Surgeon: [name], MD;  [facility];  Service: Endoscopy;  Laterality: N/A;  . EXTRACORPOREAL SHOCK WAVE LITHOTRIPSY  yrs ago  . FRACTURE SURGERY  child   Bilateral lower arms   . KNEE ARTHROSCOPY Right 2013  . MALONEY DILATION N/A 04/27/2016   Procedure: Venia Minks DILATION;  Surgeon: [name], MD;  [facility];  Service: Endoscopy;  Laterality: N/A;  . PARTIAL KNEE ARTHROPLASTY Right 04/26/2015   Procedure: RIGHT KNEE MEDIAL UNICOMPARTMENTAL ARTHROPLASTY;  Surgeon: Gaynelle Arabian, MD;  Location: [facility];  Service: Orthopedics;  Laterality: Right;  . POLYPECTOMY  04/27/2016   Procedure: POLYPECTOMY;  Surgeon: [name], MD;  [facility];  Service: Endoscopy;;  cecal , ascending, descending, and sigmoid polypectomies  . PROSTATE BIOPSY  09/26/2011   Procedure: BIOPSY TRANSRECTAL ULTRASONIC PROSTATE (TUBP);  Surgeon: [name], MD;  Location: [facility];  Service: Urology;  Laterality: N/A;     . TRANSTHORACIC ECHOCARDIOGRAM  01-07-2013   dr [name]   ef 60-65%,  grade 1 diastolic dysfunction/  mild AR without stenosis/  mild LAE/  mild to moderate calcificed MV annulus without regurg. or stenosis/  . UMBILICAL HERNIA REPAIR  yrs ago    There were no vitals filed for this visit.  Subjective Assessment - 11/15/17 1327    Subjective   S: My arm is just so stiff and sore from being gone for 2 weeks then driving back and forth from [place].    Currently in  Pain?  Yes    Pain Score  5     Pain Location  Shoulder    Pain Orientation  Right    Pain Descriptors / Indicators  Aching;Sore    Pain Type  Acute pain    Pain Radiating Towards  N/A    Pain Onset  1 to 4 weeks ago    Pain Frequency  Constant    Aggravating Factors   Not doing exercises, driving for 15 hours    Pain Relieving Factors  rest, pain medication    Effect of Pain on Daily Activities  mod effect on ADLs    Multiple Pain Sites  No         [facility] OT Assessment -  11/15/17 1329      Assessment   Medical Diagnosis  right shoulder pain secondary to right proximal humerus fracture      Precautions   Precautions  Shoulder    Type of Shoulder Precautions  Progress as tolerated.                OT Treatments/Exercises (OP) - 11/15/17 1329      Exercises   Exercises  Shoulder      Shoulder Exercises: Supine   Protraction  PROM;5 reps;AROM;10 reps    Horizontal ABduction  PROM;5 reps;AROM;10 reps    External Rotation  PROM;5 reps;AROM;10 reps    Internal Rotation  PROM;5 reps;AROM;10 reps    Flexion  PROM;5 reps;AROM;10 reps    ABduction  PROM;5 reps;AROM;10 reps      Shoulder Exercises: Seated   Protraction  AROM;10 reps    Horizontal ABduction  AROM;10 reps    External Rotation  AROM;10 reps    Internal Rotation  AROM;10 reps    Flexion  AROM;10 reps    Abduction  AROM;10 reps      Modalities   Modalities  Electrical Stimulation;Moist Heat      Moist Heat Therapy   Number Minutes Moist Heat  10 Minutes    Moist Heat Location  Shoulder      Electrical Stimulation   Electrical Stimulation Location  right upper arm    Electrical Stimulation Action  interferential    Electrical Stimulation Parameters  10' 12.0CV    Electrical Stimulation Goals  Pain      Manual Therapy   Manual Therapy  Myofascial release    Manual therapy comments  Myofascial release performed seperate from other exercises.    Myofascial Release  Myofascial release performed on  upper arm, trapezius, and scapularis region to reduce fascial restrictions in order to increase ROM and decrease pain.             OT Education - 11/15/17 1336    Education Details  A/ROM shoulder exercises. Pt was instructed to complete seated and to stop previous exercises with the PVC pipe. Pt provided with printed information regarding purchasing a ES unit for home.     Person(s) Educated  Patient    Methods  Demonstration;Explanation;Handout    Comprehension  Returned demonstration;Verbalized understanding       OT Short Term Goals - 10/30/17 1315      OT SHORT TERM GOAL #1   Title  Patient will be educated and independent with HEP to faciliate progress in therapy and allow him to return to using his RUE as his dominant extremity for all daily tasks.    Time  6    Period  Weeks    Status  On-going      OT SHORT TERM GOAL #2   Title  Patient will increase RUE strength to 3+/5 in order to complete daily and leisure tasks.    Time  6    Period  Weeks    Status  On-going      OT SHORT TERM GOAL #3   Title   Patient will increase RUE P/ROM to [facility] to increase ability to complete ADLs and be able to go fishing.    Time  6    Period  Weeks      OT SHORT TERM GOAL #4   Title   Patient will decrease pain level in RUE during daily tasks to 5/10 or less.      Time  6    Period  Weeks    Status  On-going      OT SHORT TERM GOAL #5   Title  Patient will decrease fascial restrictions in RUE to min amount or less in order to increase functional mobility needed to complete functional reaching tasks.      Time  6    Period  Weeks    Status  On-going        OT Long Term Goals - 09/18/17 1051      OT LONG TERM GOAL #1   Title  Patient will return to highest level of independence with all daily and leisure tasks while using her RUE as his dominant.      Time  12    Period  Weeks    Status  On-going      OT LONG TERM GOAL #2   Title  Patient will increase his RUE strength  to 4/5 in order to return to be able to complete all daily routine tasks.    Time  12    Period  Weeks    Status  On-going      OT LONG TERM GOAL #3   Title   Patient will increase RUE A/ROM to [facility] to increase ability to complete functional reaching tasks above head.     Time  12    Period  Weeks    Status  On-going      OT LONG TERM GOAL #4   Title  Patient will report a decrease in pain level for RUE while completing daily tasks and fishing to 3/10 or less.      Time  12    Period  Weeks    Status  On-going      OT LONG TERM GOAL #5   Title  Patient will decrease fascial restrictions to trace amount or less in RUE in order to increase functional mobility needed to complete tasks.      Time  12    Period  Weeks    Status  On-going            Plan - 11/15/17 1447    Clinical Impression Statement  A: Pt reports increased pain and stiffness of beginning of session although during passive stretching he presents with functional/ close to full ROM. Patient reports decreased pain and stiffness during session and was able to complete A/ROM supine and seated. HEP was updated. VC for form and technique as needed were provided. Patient with min fascial restrictions in right anterior and medial deltoid region. Manual techniques completed to address.     Plan  P: Complete manual therapy as needed to address fascial restrictions. Continue with A/ROM and increase repetitions as able. Add proximal shoulder strengthening.     Consulted and Agree with Plan of Care  Patient       Patient will benefit from skilled therapeutic intervention in order to improve the following deficits and impairments:  Decreased range of motion, Decreased endurance, Decreased activity tolerance, Increased fascial restrictions, Impaired UE functional use, Pain, Decreased mobility, Decreased strength  Visit Diagnosis: Acute pain of right shoulder  Stiffness of right shoulder, not elsewhere classified  Other  symptoms and signs involving the musculoskeletal system    Problem List Patient Active Problem List   Diagnosis Date Noted  . Dysphagia 07/10/2016  . Diarrhea 07/10/2016  . BMI 40.0-44.9, adult ([place]) 05/31/2015  . Neuropathy 03/17/2015  . Prostate cancer ([place]) 06/30/2013  . Abdominal aneurysm  without mention of rupture 02/19/2013  . Need for prophylactic vaccination and inoculation against influenza 02/03/2013  . ANXIETY 01/14/2013  . OBESITY NOS 01/14/2013  . VITAMIN B12 DEFICIENCY 01/14/2013  . TOBACCO ABUSE 01/14/2013  . PTSD 01/14/2013  . COPD (chronic obstructive pulmonary disease) ([place])   . OSA (obstructive sleep apnea) 08/14/2012  . RLS (restless legs syndrome) 08/14/2012  . Arthritis of both knees 08/14/2012  . Prediabetes 08/14/2012  . Nephrolithiasis 08/14/2012  . Aortic atherosclerosis ([place]) 02/19/2012  . Coronary atherosclerosis of native coronary artery 03/23/2010  . CHRONIC KIDNEY DISEASE STAGE II ([place]) 10/13/2009  . GERD 07/30/2008  . BENIGN PROSTATIC HYPERTROPHY, WITH OBSTRUCTION 02/20/2008  . Hyperlipidemia 01/31/2007  . GOUT 01/31/2007  . Essential hypertension, benign 01/31/2007    Ailene Ravel, OTR/L,CBIS  [phone]   11/15/2017, 2:50 PM  [place] [address] [place], [place], [address] Phone: [phone]   Fax:  [phone]  Name: [name] MRN: [id number] Date of Birth: December 25, 1945

## 2017-11-15 NOTE — Patient Instructions (Signed)
Repeat all exercises 10-15 times, 1-2 times per day.  1) Shoulder Protraction    Begin with elbows by your side, slowly "punch" straight out in front of you.      2) Shoulder Flexion  Standing:         Begin with arms at your side with thumbs pointed up, slowly raise both arms up and forward towards overhead.         3) Horizontal abduction/adduction   Standing:           Begin with arms straight out in front of you, bring out to the side in at "T" shape. Keep arms straight entire time.       4) Internal & External Rotation    *No band* -Stand with elbows at the side and elbows bent 90 degrees. Move your forearms away from your body, then bring back inward toward the body.     5) Shoulder Abduction  Standing:       Lying on your back begin with your arms flat on the table next to your side. Slowly move your arms out to the side so that they go overhead, in a jumping jack or snow angel movement.       

## 2017-11-20 ENCOUNTER — Encounter (HOSPITAL_COMMUNITY): Payer: Self-pay | Admitting: Occupational Therapy

## 2017-11-20 ENCOUNTER — Ambulatory Visit (HOSPITAL_COMMUNITY): Payer: Medicare Other | Admitting: Occupational Therapy

## 2017-11-20 DIAGNOSIS — M25611 Stiffness of right shoulder, not elsewhere classified: Secondary | ICD-10-CM | POA: Diagnosis not present

## 2017-11-20 DIAGNOSIS — R29898 Other symptoms and signs involving the musculoskeletal system: Secondary | ICD-10-CM | POA: Diagnosis not present

## 2017-11-20 DIAGNOSIS — M25511 Pain in right shoulder: Secondary | ICD-10-CM

## 2017-11-20 NOTE — Therapy (Signed)
[place], [place], [address] Phone: [phone]   Fax:  [phone]  Occupational Therapy Treatment  Patient Details  Name: [name] MRN: [id number] Date of Birth: 03/11/1946 Referring Provider: Dr. Victorino December   Encounter Date: 11/20/2017  OT End of Session - 11/20/17 1348    Visit Number  10    Number of Visits  24    Date for OT Re-Evaluation  12/07/17    Authorization Type  Primary insurance is Medicare; secondary is BCBC; visit limit of 60 with 7 used.    Authorization Time Period  per calendar year    Authorization - Visit Number  59    Authorization - Number of Visits  64    OT Start Time  1306    OT Stop Time  1346    OT Time Calculation (min)  40 min    Activity Tolerance  Patient tolerated treatment well    Behavior During Therapy  WFL for tasks assessed/performed       Past Medical History:  Diagnosis Date  . Aortic atherosclerosis ([place]) followed by dr Trula Slade   2004  s/p  closure Penetrating atherosclerotic ulcer of infarenal aorta w/ endovascular stent graft  . Arthritis   . CKD (chronic kidney disease), stage III ([place])   . Closed fracture of head of humerus 07/2017   right shoulder from fall; 09-25-2017 per pt no surgical intervention, only wore sling, intermittant pain  . Coronary atherosclerosis of native coronary artery cardiologist-  dr Zigmund Gottron   positive myoview for ischemia 09-27-2009;  10-01-2009 per cardiac cath-- minimal nonobstructive CAD w/ 30% LAD   . ED (erectile dysfunction)   . Emphysema/COPD [facility])    followed by pcp--- last exacerbation 09-12-2017;  09-25-2017 per pt no cough, sob or congestion  . Essential hypertension, benign   . First degree heart block   . Frequency of urination   . GERD (gastroesophageal reflux disease)   . Gout    09-25-2017  per pt stable,  last episode 2015  . Heart murmur   . History of adenomatous polyp of colon   . History of  closed head injury 2005   per pt residual resolved  . History of external beam radiation therapy    completed 10/ 2015 for prostate cancer  . History of gastric ulcer 2014  . History of kidney stones   . History of pneumothorax    spontaneous pneumo treated w/ chest tube  . History of rib fracture 07/2017   from fall,  right side 6th,7th,8th  . Neuropathy   . OAB (overactive bladder)   . OSA (obstructive sleep apnea)    Intolerant of CPAP  . Prostate cancer [facility]) urologist-  dr wrenn/  oncologist-- dr Alen Blew and dr Tammi Klippel   dx 09-26-2011 via bx-- Stage T3b,N0,  Gleason 4+4, PSA 11.2 with METs to external iliac lymph node(resolved with ADT)-- treated w/ ADT ;   05/2013 staging work-up for rising PSA , started casodex and completed radiation therapy 10/ 2015;   rising PSA post treatment  . RLS (restless legs syndrome)   . Ureteral neocystostomy bleed    left side  . Urge urinary incontinence     Past Surgical History:  Procedure Laterality Date  . ABDOMINAL AORTIC ENDOVASCULAR STENT GRAFT  09-13-2007    dr [name]  [facility]   closure penetrating atherosclerotic ulcer of infarenal aorta with stent graft  . CARDIAC CATHETERIZATION  2005  per pt normal (done in [place])  . CARDIAC CATHETERIZATION  10/06/2009    dr cooper   minimal nonobstructive CAD w/30% LAD otherwise normal coronaries, LVEDP 90mmHg  . CARDIOVASCULAR STRESS TEST  09/27/2009    dr Aundra Dubin   lexiscan nuclear study w/ moderate reversible inferior perfusion defect ischemia,  ef 60% (cardiac cath scheduled)  . CHEST TUBE INSERTION  1989   "collapsed lung"due to injury  . CIRCUMCISION  09/26/2011   Procedure: CIRCUMCISION ADULT;  Surgeon: [name], MD;  Location: [facility];  Service: Urology;  Laterality: N/A;  . COLONOSCOPY W/ POLYPECTOMY    . COLONOSCOPY WITH PROPOFOL N/A 04/27/2016   Procedure: COLONOSCOPY WITH PROPOFOL;  Surgeon: [name], MD;  [facility];  Service: Endoscopy;  Laterality: N/A;   1115-moved to 845 per Ginger  . CYSTOSCOPY  09/26/2011   Procedure: CYSTOSCOPY;  Surgeon: [name], MD;  Location: [facility];  Service: Urology;  Laterality: N/A;  . CYSTOSCOPY/RETROGRADE/URETEROSCOPY Left 09/27/2017   Procedure: CYSTOSCOPYLEFT Marlow Baars AND RENAL WASHINGS;  Surgeon: [name], MD;  [facility];  Service: Urology;  Laterality: Left;  . ESOPHAGOGASTRODUODENOSCOPY (EGD) WITH PROPOFOL N/A 04/27/2016   Procedure: ESOPHAGOGASTRODUODENOSCOPY (EGD) WITH PROPOFOL;  Surgeon: [name], MD;  [facility];  Service: Endoscopy;  Laterality: N/A;  . EXTRACORPOREAL SHOCK WAVE LITHOTRIPSY  yrs ago  . FRACTURE SURGERY  child   Bilateral lower arms   . KNEE ARTHROSCOPY Right 2013  . MALONEY DILATION N/A 04/27/2016   Procedure: Venia Minks DILATION;  Surgeon: [name], MD;  [facility];  Service: Endoscopy;  Laterality: N/A;  . PARTIAL KNEE ARTHROPLASTY Right 04/26/2015   Procedure: RIGHT KNEE MEDIAL UNICOMPARTMENTAL ARTHROPLASTY;  Surgeon: Gaynelle Arabian, MD;  Location: [facility];  Service: Orthopedics;  Laterality: Right;  . POLYPECTOMY  04/27/2016   Procedure: POLYPECTOMY;  Surgeon: [name], MD;  [facility];  Service: Endoscopy;;  cecal , ascending, descending, and sigmoid polypectomies  . PROSTATE BIOPSY  09/26/2011   Procedure: BIOPSY TRANSRECTAL ULTRASONIC PROSTATE (TUBP);  Surgeon: [name], MD;  Location: [facility];  Service: Urology;  Laterality: N/A;     . TRANSTHORACIC ECHOCARDIOGRAM  01-07-2013   dr [name]   ef 60-65%,  grade 1 diastolic dysfunction/  mild AR without stenosis/  mild LAE/  mild to moderate calcificed MV annulus without regurg. or stenosis/  . UMBILICAL HERNIA REPAIR  yrs ago    There were no vitals filed for this visit.  Subjective Assessment - 11/20/17 1305    Subjective   S: I'm going back to work on Monday.     Currently in Pain?  Yes    Pain Score  3     Pain Location   Shoulder    Pain Orientation  Right    Pain Descriptors / Indicators  Aching;Sore    Pain Type  Acute pain    Pain Radiating Towards  N/A    Pain Onset  1 to 4 weeks ago    Pain Frequency  Constant    Aggravating Factors   not doing exercises, driving    Pain Relieving Factors  rest, pain medication    Effect of Pain on Daily Activities  mod effect on ADLs    Multiple Pain Sites  No         [facility] OT Assessment - 11/20/17 1303      Assessment   Medical Diagnosis  right shoulder  pain secondary to right proximal humerus fracture      Precautions   Precautions  Shoulder    Type of Shoulder Precautions  Progress as tolerated.                OT Treatments/Exercises (OP) - 11/20/17 1309      Exercises   Exercises  Shoulder      Shoulder Exercises: Supine   Protraction  PROM;5 reps;AROM;10 reps    Horizontal ABduction  PROM;5 reps;AROM;10 reps    External Rotation  PROM;5 reps;AROM;10 reps    Internal Rotation  PROM;5 reps;AROM;10 reps    Flexion  PROM;5 reps;AROM;10 reps    ABduction  PROM;5 reps;AROM;10 reps      Shoulder Exercises: Seated   Protraction  AROM;10 reps    Horizontal ABduction  AROM;10 reps    External Rotation  AROM;10 reps    Internal Rotation  AROM;10 reps    Flexion  AROM;10 reps    Abduction  AROM;10 reps      Shoulder Exercises: Standing   Extension  Theraband;10 reps    Theraband Level (Shoulder Extension)  Level 2 (Red)    Row  Theraband;10 reps    Theraband Level (Shoulder Row)  Level 2 (Red)    Retraction  Theraband;10 reps    Theraband Level (Shoulder Retraction)  Level 2 (Red)      Shoulder Exercises: ROM/Strengthening   Proximal Shoulder Strengthening, Supine  10X each no rest breaks      Manual Therapy   Manual Therapy  Myofascial release    Manual therapy comments  Myofascial release performed seperate from other exercises.    Myofascial Release  Myofascial release performed on upper arm, trapezius, and scapularis region to  reduce fascial restrictions in order to increase ROM and decrease pain.               OT Short Term Goals - 10/30/17 1315      OT SHORT TERM GOAL #1   Title  Patient will be educated and independent with HEP to faciliate progress in therapy and allow him to return to using his RUE as his dominant extremity for all daily tasks.    Time  6    Period  Weeks    Status  On-going      OT SHORT TERM GOAL #2   Title  Patient will increase RUE strength to 3+/5 in order to complete daily and leisure tasks.    Time  6    Period  Weeks    Status  On-going      OT SHORT TERM GOAL #3   Title   Patient will increase RUE P/ROM to [facility] to increase ability to complete ADLs and be able to go fishing.    Time  6    Period  Weeks      OT SHORT TERM GOAL #4   Title   Patient will decrease pain level in RUE during daily tasks to 5/10 or less.      Time  6    Period  Weeks    Status  On-going      OT SHORT TERM GOAL #5   Title  Patient will decrease fascial restrictions in RUE to min amount or less in order to increase functional mobility needed to complete functional reaching tasks.      Time  6    Period  Weeks    Status  On-going        OT  Long Term Goals - 09/18/17 1051      OT LONG TERM GOAL #1   Title  Patient will return to highest level of independence with all daily and leisure tasks while using her RUE as his dominant.      Time  12    Period  Weeks    Status  On-going      OT LONG TERM GOAL #2   Title  Patient will increase his RUE strength to 4/5 in order to return to be able to complete all daily routine tasks.    Time  12    Period  Weeks    Status  On-going      OT LONG TERM GOAL #3   Title   Patient will increase RUE A/ROM to [facility] to increase ability to complete functional reaching tasks above head.     Time  12    Period  Weeks    Status  On-going      OT LONG TERM GOAL #4   Title  Patient will report a decrease in pain level for RUE while completing daily  tasks and fishing to 3/10 or less.      Time  12    Period  Weeks    Status  On-going      OT LONG TERM GOAL #5   Title  Patient will decrease fascial restrictions to trace amount or less in RUE in order to increase functional mobility needed to complete tasks.      Time  12    Period  Weeks    Status  On-going            Plan - 11/20/17 1348    Clinical Impression Statement  A: Pt with minimal fascial restrictions along anterior deltoid, trapezius, and medial border of scapula, manual therapy completed to address. Continued with A/ROM this session, in sitting pt able to achieve approximately 50% ROM for flexion and abduction. Added proximal shoulder strengthening in supine and red scapular theraband today, min difficulty with retraction. Verbal cuing for form and technique.     Plan  P: manual therapy as needed, continue with with A/ROM and increase repetitions to 15, continue with scapular theraband, add pvc pipe slide       Patient will benefit from skilled therapeutic intervention in order to improve the following deficits and impairments:  Decreased range of motion, Decreased endurance, Decreased activity tolerance, Increased fascial restrictions, Impaired UE functional use, Pain, Decreased mobility, Decreased strength  Visit Diagnosis: Acute pain of right shoulder  Stiffness of right shoulder, not elsewhere classified  Other symptoms and signs involving the musculoskeletal system    Problem List Patient Active Problem List   Diagnosis Date Noted  . Dysphagia 07/10/2016  . Diarrhea 07/10/2016  . BMI 40.0-44.9, adult ([place]) 05/31/2015  . Neuropathy 03/17/2015  . Prostate cancer ([place]) 06/30/2013  . Abdominal aneurysm without mention of rupture 02/19/2013  . Need for prophylactic vaccination and inoculation against influenza 02/03/2013  . ANXIETY 01/14/2013  . OBESITY NOS 01/14/2013  . VITAMIN B12 DEFICIENCY 01/14/2013  . TOBACCO ABUSE 01/14/2013  . PTSD 01/14/2013   . COPD (chronic obstructive pulmonary disease) ([place])   . OSA (obstructive sleep apnea) 08/14/2012  . RLS (restless legs syndrome) 08/14/2012  . Arthritis of both knees 08/14/2012  . Prediabetes 08/14/2012  . Nephrolithiasis 08/14/2012  . Aortic atherosclerosis ([place]) 02/19/2012  . Coronary atherosclerosis of native coronary artery 03/23/2010  . CHRONIC KIDNEY DISEASE STAGE II ([facility]) 10/13/2009  .  GERD 07/30/2008  . BENIGN PROSTATIC HYPERTROPHY, WITH OBSTRUCTION 02/20/2008  . Hyperlipidemia 01/31/2007  . GOUT 01/31/2007  . Essential hypertension, benign 01/31/2007   Guadelupe Sabin, OTR/L  [phone] 11/20/2017, 1:52 PM  [facility] [address] [place], [place], [address] Phone: [phone]   Fax:  [phone]  Name: [name] MRN: [id number] Date of Birth: 1945/08/23

## 2017-11-22 ENCOUNTER — Ambulatory Visit (HOSPITAL_COMMUNITY): Payer: Medicare Other | Admitting: Occupational Therapy

## 2017-11-22 ENCOUNTER — Encounter (HOSPITAL_COMMUNITY): Payer: Self-pay | Admitting: Occupational Therapy

## 2017-11-22 ENCOUNTER — Other Ambulatory Visit: Payer: Self-pay | Admitting: Family

## 2017-11-22 DIAGNOSIS — R29898 Other symptoms and signs involving the musculoskeletal system: Secondary | ICD-10-CM

## 2017-11-22 DIAGNOSIS — M25511 Pain in right shoulder: Secondary | ICD-10-CM | POA: Diagnosis not present

## 2017-11-22 DIAGNOSIS — M25611 Stiffness of right shoulder, not elsewhere classified: Secondary | ICD-10-CM

## 2017-11-22 NOTE — Therapy (Addendum)
[place], [place], [address] Phone: [phone]   Fax:  [phone]  Occupational Therapy Treatment  Patient Details  Name: [name] MRN: [id number] Date of Birth: [birth year] Referring Provider: Dr. Victorino December   Encounter Date: 11/22/2017  OT End of Session - 11/22/17 1348    Visit Number  11    Number of Visits  24    Date for OT Re-Evaluation  12/07/17    Authorization Type  Primary insurance is Medicare; secondary is BCBC; visit limit of 60 with 7 used.    Authorization Time Period  per calendar year    Authorization - Visit Number  17    Authorization - Number of Visits  50    OT Start Time  1301    OT Stop Time  1359    OT Time Calculation (min)  58 min    Activity Tolerance  Patient tolerated treatment well    Behavior During Therapy  WFL for tasks assessed/performed       Past Medical History:  Diagnosis Date  . Aortic atherosclerosis ([place]) followed by dr Trula Slade   2004  s/p  closure Penetrating atherosclerotic ulcer of infarenal aorta w/ endovascular stent graft  . Arthritis   . CKD (chronic kidney disease), stage III ([place])   . Closed fracture of head of humerus 07/2017   right shoulder from fall; 09-25-2017 per pt no surgical intervention, only wore sling, intermittant pain  . Coronary atherosclerosis of native coronary artery cardiologist-  dr Zigmund Gottron   positive myoview for ischemia 09-27-2009;  10-01-2009 per cardiac cath-- minimal nonobstructive CAD w/ 30% LAD   . ED (erectile dysfunction)   . Emphysema/COPD [facility])    followed by pcp--- last exacerbation 09-12-2017;  09-25-2017 per pt no cough, sob or congestion  . Essential hypertension, benign   . First degree heart block   . Frequency of urination   . GERD (gastroesophageal reflux disease)   . Gout    09-25-2017  per pt stable,  last episode 2015  . Heart murmur   . History of adenomatous polyp of colon   . History of  closed head injury 2005   per pt residual resolved  . History of external beam radiation therapy    completed 10/ 2015 for prostate cancer  . History of gastric ulcer 2014  . History of kidney stones   . History of pneumothorax    spontaneous pneumo treated w/ chest tube  . History of rib fracture 07/2017   from fall,  right side 6th,7th,8th  . Neuropathy   . OAB (overactive bladder)   . OSA (obstructive sleep apnea)    Intolerant of CPAP  . Prostate cancer [facility]) urologist-  dr wrenn/  oncologist-- dr Alen Blew and dr Tammi Klippel   dx 09-26-2011 via bx-- Stage T3b,N0,  Gleason 4+4, PSA 11.2 with METs to external iliac lymph node(resolved with ADT)-- treated w/ ADT ;   05/2013 staging work-up for rising PSA , started casodex and completed radiation therapy 10/ 2015;   rising PSA post treatment  . RLS (restless legs syndrome)   . Ureteral neocystostomy bleed    left side  . Urge urinary incontinence     Past Surgical History:  Procedure Laterality Date  . ABDOMINAL AORTIC ENDOVASCULAR STENT GRAFT  09-13-2007    dr [name]  [facility]   closure penetrating atherosclerotic ulcer of infarenal aorta with stent graft  . CARDIAC CATHETERIZATION  2005  per pt normal (done in [place])  . CARDIAC CATHETERIZATION  10/06/2009    dr cooper   minimal nonobstructive CAD w/30% LAD otherwise normal coronaries, LVEDP 36mmHg  . CARDIOVASCULAR STRESS TEST  09/27/2009    dr Aundra Dubin   lexiscan nuclear study w/ moderate reversible inferior perfusion defect ischemia,  ef 60% (cardiac cath scheduled)  . CHEST TUBE INSERTION  1989   "collapsed lung"due to injury  . CIRCUMCISION  09/26/2011   Procedure: CIRCUMCISION ADULT;  Surgeon: [name], MD;  Location: [facility];  Service: Urology;  Laterality: N/A;  . COLONOSCOPY W/ POLYPECTOMY    . COLONOSCOPY WITH PROPOFOL N/A 04/27/2016   Procedure: COLONOSCOPY WITH PROPOFOL;  Surgeon: [name], MD;  [facility];  Service: Endoscopy;  Laterality: N/A;   1115-moved to 845 per Ginger  . CYSTOSCOPY  09/26/2011   Procedure: CYSTOSCOPY;  Surgeon: [name], MD;  Location: [facility];  Service: Urology;  Laterality: N/A;  . CYSTOSCOPY/RETROGRADE/URETEROSCOPY Left 09/27/2017   Procedure: CYSTOSCOPYLEFT Marlow Baars AND RENAL WASHINGS;  Surgeon: [name], MD;  Location: [facility];  Service: Urology;  Laterality: Left;  . ESOPHAGOGASTRODUODENOSCOPY (EGD) WITH PROPOFOL N/A 04/27/2016   Procedure: ESOPHAGOGASTRODUODENOSCOPY (EGD) WITH PROPOFOL;  Surgeon: [name], MD;  [facility];  Service: Endoscopy;  Laterality: N/A;  . EXTRACORPOREAL SHOCK WAVE LITHOTRIPSY  yrs ago  . FRACTURE SURGERY  child   Bilateral lower arms   . KNEE ARTHROSCOPY Right 2013  . MALONEY DILATION N/A 04/27/2016   Procedure: Venia Minks DILATION;  Surgeon: [name], MD;  [facility];  Service: Endoscopy;  Laterality: N/A;  . PARTIAL KNEE ARTHROPLASTY Right 04/26/2015   Procedure: RIGHT KNEE MEDIAL UNICOMPARTMENTAL ARTHROPLASTY;  Surgeon: Gaynelle Arabian, MD;  [facility]: [facility];  Service: Orthopedics;  Laterality: Right;  . POLYPECTOMY  04/27/2016   Procedure: POLYPECTOMY;  Surgeon: [name], MD;  [facility];  Service: Endoscopy;;  cecal , ascending, descending, and sigmoid polypectomies  . PROSTATE BIOPSY  09/26/2011   Procedure: BIOPSY TRANSRECTAL ULTRASONIC PROSTATE (TUBP);  Surgeon: [name], MD;  Location[facility];  Service: Urology;  Laterality: N/A;     . TRANSTHORACIC ECHOCARDIOGRAM  01-07-2013   dr [name]   ef 60-65%,  grade 1 diastolic dysfunction/  mild AR without stenosis/  mild LAE/  mild to moderate calcificed MV annulus without regurg. or stenosis/  . UMBILICAL HERNIA REPAIR  yrs ago    There were no vitals filed for this visit.  Subjective Assessment - 11/22/17 1259    Subjective   S: It's been really hurting, I can't even lift it up.     Currently in Pain?  Yes    Pain Score  8      Pain Location  Shoulder    Pain Orientation  Right    Pain Descriptors / Indicators  Aching;Sore    Pain Type  Acute pain    Pain Radiating Towards  N/A    Pain Onset  1 to 4 weeks ago    Pain Frequency  Constant    Aggravating Factors   new exercises    Pain Relieving Factors  rest, pain medication    Effect of Pain on Daily Activities  mod effect on ADLs    Multiple Pain Sites  No         [facility] OT Assessment - 11/22/17 1259      Assessment   Medical Diagnosis  right  shoulder pain secondary to right proximal humerus fracture      Precautions   Precautions  Shoulder    Type of Shoulder Precautions  Progress as tolerated.                OT Treatments/Exercises (OP) - 11/22/17 1300      Exercises   Exercises  Shoulder      Shoulder Exercises: Supine   Protraction  PROM;5 reps;AROM;10 reps    Horizontal ABduction  PROM;5 reps;AROM;10 reps    External Rotation  PROM;5 reps;AROM;10 reps    Internal Rotation  PROM;5 reps;AROM;10 reps    Flexion  PROM;5 reps;AROM;10 reps    ABduction  PROM;5 reps    ABduction Limitations  unable to complete due to pain      Shoulder Exercises: Seated   Protraction  AAROM;10 reps    Horizontal ABduction  AAROM;10 reps    External Rotation  AROM;10 reps    Internal Rotation  AROM;10 reps    Flexion  AAROM;10 reps    Abduction  AAROM;10 reps      Shoulder Exercises: ROM/Strengthening   Proximal Shoulder Strengthening, Supine  10X each no rest breaks      Modalities   Modalities  Electrical Stimulation;Moist Heat      Moist Heat Therapy   Number Minutes Moist Heat  10 Minutes    Moist Heat Location  Shoulder      Electrical Stimulation   Electrical Stimulation Location  right upper arm    Electrical Stimulation Action  interferential    Electrical Stimulation Parameters  19.5 CV, 15'    Electrical Stimulation Goals  Pain      Manual Therapy   Manual Therapy  Myofascial release    Manual therapy comments  Myofascial  release performed seperate from other exercises.    Myofascial Release  Myofascial release performed on upper arm, trapezius, and scapularis region to reduce fascial restrictions in order to increase ROM and decrease pain.               OT Short Term Goals - 10/30/17 1315      OT SHORT TERM GOAL #1   Title  Patient will be educated and independent with HEP to faciliate progress in therapy and allow him to return to using his RUE as his dominant extremity for all daily tasks.    Time  6    Period  Weeks    Status  On-going      OT SHORT TERM GOAL #2   Title  Patient will increase RUE strength to 3+/5 in order to complete daily and leisure tasks.    Time  6    Period  Weeks    Status  On-going      OT SHORT TERM GOAL #3   Title   Patient will increase RUE P/ROM to [facility] to increase ability to complete ADLs and be able to go fishing.    Time  6    Period  Weeks      OT SHORT TERM GOAL #4   Title   Patient will decrease pain level in RUE during daily tasks to 5/10 or less.      Time  6    Period  Weeks    Status  On-going      OT SHORT TERM GOAL #5   Title  Patient will decrease fascial restrictions in RUE to min amount or less in order to increase functional mobility needed to complete  functional reaching tasks.      Time  6    Period  Weeks    Status  On-going        OT Long Term Goals - 09/18/17 1051      OT LONG TERM GOAL #1   Title  Patient will return to highest level of independence with all daily and leisure tasks while using her RUE as his dominant.      Time  12    Period  Weeks    Status  On-going      OT LONG TERM GOAL #2   Title  Patient will increase his RUE strength to 4/5 in order to return to be able to complete all daily routine tasks.    Time  12    Period  Weeks    Status  On-going      OT LONG TERM GOAL #3   Title   Patient will increase RUE A/ROM to [facility] to increase ability to complete functional reaching tasks above head.     Time  12     Period  Weeks    Status  On-going      OT LONG TERM GOAL #4   Title  Patient will report a decrease in pain level for RUE while completing daily tasks and fishing to 3/10 or less.      Time  12    Period  Weeks    Status  On-going      OT LONG TERM GOAL #5   Title  Patient will decrease fascial restrictions to trace amount or less in RUE in order to increase functional mobility needed to complete tasks.      Time  12    Period  Weeks    Status  On-going            Plan - 11/22/17 1348    Clinical Impression Statement  A: Pt reporting increased pain since last session, reporting inability to lift his arm past waist level.  Educated pt on expected soreness after new exercises (added theraband previous session). Pt able to complete some A/ROM and some AA/ROM, reaching ROM WFL. ES applied at end of session for pain management, pt reporting pain at 3/10 after ES.     Plan  P: Follow up on pain over the weekend. Manual therapy as needed, add PVC pipe slide and resume therabands if pt able to tolerate       Patient will benefit from skilled therapeutic intervention in order to improve the following deficits and impairments:  Decreased range of motion, Decreased endurance, Decreased activity tolerance, Increased fascial restrictions, Impaired UE functional use, Pain, Decreased mobility, Decreased strength  Visit Diagnosis: Acute pain of right shoulder  Stiffness of right shoulder, not elsewhere classified  Other symptoms and signs involving the musculoskeletal system    Problem List Patient Active Problem List   Diagnosis Date Noted  . Dysphagia 07/10/2016  . Diarrhea 07/10/2016  . BMI 40.0-44.9, adult ([place]) 05/31/2015  . Neuropathy 03/17/2015  . Prostate cancer ([place]) 06/30/2013  . Abdominal aneurysm without mention of rupture 02/19/2013  . Need for prophylactic vaccination and inoculation against influenza 02/03/2013  . ANXIETY 01/14/2013  . OBESITY NOS 01/14/2013  .  VITAMIN B12 DEFICIENCY 01/14/2013  . TOBACCO ABUSE 01/14/2013  . PTSD 01/14/2013  . COPD (chronic obstructive pulmonary disease) ([place])   . OSA (obstructive sleep apnea) 08/14/2012  . RLS (restless legs syndrome) 08/14/2012  . Arthritis of both knees 08/14/2012  .  Prediabetes 08/14/2012  . Nephrolithiasis 08/14/2012  . Aortic atherosclerosis ([place]) 02/19/2012  . Coronary atherosclerosis of native coronary artery 03/23/2010  . CHRONIC KIDNEY DISEASE STAGE II ([facility]) 10/13/2009  . GERD 07/30/2008  . BENIGN PROSTATIC HYPERTROPHY, WITH OBSTRUCTION 02/20/2008  . Hyperlipidemia 01/31/2007  . GOUT 01/31/2007  . Essential hypertension, benign 01/31/2007   Guadelupe Sabin, OTR/L  [phone] 11/22/2017, 5:17 PM  [place], [place], [address] Phone: [phone]   Fax:  [phone]  Name: [name] MRN: [id number] Date of Birth: [birth year]

## 2017-11-27 ENCOUNTER — Ambulatory Visit (HOSPITAL_COMMUNITY): Payer: Medicare Other | Admitting: Occupational Therapy

## 2017-11-29 ENCOUNTER — Ambulatory Visit (HOSPITAL_COMMUNITY): Payer: Medicare Other | Admitting: Occupational Therapy

## 2017-11-29 ENCOUNTER — Encounter (HOSPITAL_COMMUNITY): Payer: Self-pay | Admitting: Occupational Therapy

## 2017-11-29 DIAGNOSIS — M25611 Stiffness of right shoulder, not elsewhere classified: Secondary | ICD-10-CM

## 2017-11-29 DIAGNOSIS — R29898 Other symptoms and signs involving the musculoskeletal system: Secondary | ICD-10-CM

## 2017-11-29 DIAGNOSIS — M25511 Pain in right shoulder: Secondary | ICD-10-CM

## 2017-11-29 NOTE — Therapy (Signed)
[place], [place], [address] Phone: [phone]   Fax:  [phone]  Occupational Therapy Treatment  Patient Details  Name: [name] MRN: [id number] Date of Birth: 26-Nov-1945 Referring Provider: Dr. Victorino December   Encounter Date: 11/29/2017  OT End of Session - 11/29/17 1450    Visit Number  12    Number of Visits  24    Date for OT Re-Evaluation  12/07/17    Authorization Type  Primary insurance is Medicare; secondary is BCBC; visit limit of 60 with 7 used.    Authorization Time Period  per calendar year    Authorization - Visit Number  18    Authorization - Number of Visits  50    OT Start Time  1301    OT Stop Time  1343    OT Time Calculation (min)  42 min    Activity Tolerance  Patient tolerated treatment well    Behavior During Therapy  WFL for tasks assessed/performed       Past Medical History:  Diagnosis Date  . Aortic atherosclerosis ([place]) followed by dr Trula Slade   2004  s/p  closure Penetrating atherosclerotic ulcer of infarenal aorta w/ endovascular stent graft  . Arthritis   . CKD (chronic kidney disease), stage III ([place])   . Closed fracture of head of humerus 07/2017   right shoulder from fall; 09-25-2017 per pt no surgical intervention, only wore sling, intermittant pain  . Coronary atherosclerosis of native coronary artery cardiologist-  dr Zigmund Gottron   positive myoview for ischemia 09-27-2009;  10-01-2009 per cardiac cath-- minimal nonobstructive CAD w/ 30% LAD   . ED (erectile dysfunction)   . Emphysema/COPD [facility])    followed by pcp--- last exacerbation 09-12-2017;  09-25-2017 per pt no cough, sob or congestion  . Essential hypertension, benign   . First degree heart block   . Frequency of urination   . GERD (gastroesophageal reflux disease)   . Gout    09-25-2017  per pt stable,  last episode 2015  . Heart murmur   . History of adenomatous polyp of colon   . History of  closed head injury 2005   per pt residual resolved  . History of external beam radiation therapy    completed 10/ 2015 for prostate cancer  . History of gastric ulcer 2014  . History of kidney stones   . History of pneumothorax    spontaneous pneumo treated w/ chest tube  . History of rib fracture 07/2017   from fall,  right side 6th,7th,8th  . Neuropathy   . OAB (overactive bladder)   . OSA (obstructive sleep apnea)    Intolerant of CPAP  . Prostate cancer [facility]) urologist-  dr wrenn/  oncologist-- dr Alen Blew and dr Tammi Klippel   dx 09-26-2011 via bx-- Stage T3b,N0,  Gleason 4+4, PSA 11.2 with METs to external iliac lymph node(resolved with ADT)-- treated w/ ADT ;   05/2013 staging work-up for rising PSA , started casodex and completed radiation therapy 10/ 2015;   rising PSA post treatment  . RLS (restless legs syndrome)   . Ureteral neocystostomy bleed    left side  . Urge urinary incontinence     Past Surgical History:  Procedure Laterality Date  . ABDOMINAL AORTIC ENDOVASCULAR STENT GRAFT  09-13-2007    dr [name]  [facility]   closure penetrating atherosclerotic ulcer of infarenal aorta with stent graft  . CARDIAC CATHETERIZATION  2005  per pt normal (done in [place])  . CARDIAC CATHETERIZATION  10/06/2009    dr cooper   minimal nonobstructive CAD w/30% LAD otherwise normal coronaries, LVEDP 65mmHg  . CARDIOVASCULAR STRESS TEST  09/27/2009    dr Aundra Dubin   lexiscan nuclear study w/ moderate reversible inferior perfusion defect ischemia,  ef 60% (cardiac cath scheduled)  . CHEST TUBE INSERTION  1989   "collapsed lung"due to injury  . CIRCUMCISION  09/26/2011   Procedure: CIRCUMCISION ADULT;  Surgeon: [name], MD;  Location: [facility];  Service: Urology;  Laterality: N/A;  . COLONOSCOPY W/ POLYPECTOMY    . COLONOSCOPY WITH PROPOFOL N/A 04/27/2016   Procedure: COLONOSCOPY WITH PROPOFOL;  Surgeon: [name], MD;  [facility];  Service: Endoscopy;  Laterality: N/A;   1115-moved to 845 per Ginger  . CYSTOSCOPY  09/26/2011   Procedure: CYSTOSCOPY;  Surgeon: [name], MD;  Location: [facility];  Service: Urology;  Laterality: N/A;  . CYSTOSCOPY/RETROGRADE/URETEROSCOPY Left 09/27/2017   Procedure: CYSTOSCOPYLEFT Marlow Baars AND RENAL WASHINGS;  Surgeon: [name], MD;  Location[facility];  Service: Urology;  Laterality: Left;  . ESOPHAGOGASTRODUODENOSCOPY (EGD) WITH PROPOFOL N/A 04/27/2016   Procedure: ESOPHAGOGASTRODUODENOSCOPY (EGD) WITH PROPOFOL;  Surgeon: [name], MD;  [facility];  Service: Endoscopy;  Laterality: N/A;  . EXTRACORPOREAL SHOCK WAVE LITHOTRIPSY  yrs ago  . FRACTURE SURGERY  child   Bilateral lower arms   . KNEE ARTHROSCOPY Right 2013  . MALONEY DILATION N/A 04/27/2016   Procedure: Venia Minks DILATION;  Surgeon: [name], MD;  [facility];  Service: Endoscopy;  Laterality: N/A;  . PARTIAL KNEE ARTHROPLASTY Right 04/26/2015   Procedure: RIGHT KNEE MEDIAL UNICOMPARTMENTAL ARTHROPLASTY;  Surgeon: Gaynelle Arabian, MD;  Location[facility];  Service: Orthopedics;  Laterality: Right;  . POLYPECTOMY  04/27/2016   Procedure: POLYPECTOMY;  Surgeon: [name], MD;  [facility];  Service: Endoscopy;;  cecal , ascending, descending, and sigmoid polypectomies  . PROSTATE BIOPSY  09/26/2011   Procedure: BIOPSY TRANSRECTAL ULTRASONIC PROSTATE (TUBP);  Surgeon: [name], MD;  Location[facility];  Service: Urology;  Laterality: N/A;     . TRANSTHORACIC ECHOCARDIOGRAM  01-07-2013   dr [name]   ef 60-65%,  grade 1 diastolic dysfunction/  mild AR without stenosis/  mild LAE/  mild to moderate calcificed MV annulus without regurg. or stenosis/  . UMBILICAL HERNIA REPAIR  yrs ago    There were no vitals filed for this visit.  Subjective Assessment - 11/29/17 1251    Subjective   S: I don't know if it's getting any better or not.     Currently in Pain?  Yes    Pain Score  5      Pain Location  Shoulder    Pain Orientation  Right    Pain Descriptors / Indicators  Aching;Sore    Pain Type  Acute pain    Pain Radiating Towards  N/A    Pain Onset  More than a month ago    Pain Frequency  Constant    Aggravating Factors   new exercises    Pain Relieving Factors  rest, pain medication    Effect of Pain on Daily Activities  mod effect ADLs    Multiple Pain Sites  No         [facility] OT Assessment - 11/29/17 1251      Assessment   Medical Diagnosis  right shoulder  pain secondary to right proximal humerus fracture      Precautions   Precautions  Shoulder    Type of Shoulder Precautions  Progress as tolerated.                OT Treatments/Exercises (OP) - 11/29/17 1252      Exercises   Exercises  Shoulder      Shoulder Exercises: Supine   Protraction  PROM;5 reps;AROM;10 reps    Horizontal ABduction  PROM;5 reps;AROM;10 reps    External Rotation  PROM;5 reps;AROM;10 reps    Internal Rotation  PROM;5 reps;AROM;10 reps    Flexion  PROM;5 reps;AROM;10 reps    ABduction  PROM;5 reps;AROM;10 reps      Shoulder Exercises: Seated   Protraction  AROM;10 reps    Horizontal ABduction  AROM;10 reps    External Rotation  AROM;10 reps    Internal Rotation  AROM;10 reps    Flexion  AROM;10 reps    Abduction  AROM;10 reps      Shoulder Exercises: Pulleys   Flexion  1 minute    ABduction  1 minute      Shoulder Exercises: ROM/Strengthening   Proximal Shoulder Strengthening, Supine  10X each no rest breaks    Proximal Shoulder Strengthening, Seated  10X each no rest breaks      Manual Therapy   Manual Therapy  Myofascial release    Manual therapy comments  Myofascial release performed seperate from other exercises.    Myofascial Release  Myofascial release performed on upper arm, trapezius, and scapularis region to reduce fascial restrictions in order to increase ROM and decrease pain.               OT Short Term Goals - 10/30/17 1315       OT SHORT TERM GOAL #1   Title  Patient will be educated and independent with HEP to faciliate progress in therapy and allow him to return to using his RUE as his dominant extremity for all daily tasks.    Time  6    Period  Weeks    Status  On-going      OT SHORT TERM GOAL #2   Title  Patient will increase RUE strength to 3+/5 in order to complete daily and leisure tasks.    Time  6    Period  Weeks    Status  On-going      OT SHORT TERM GOAL #3   Title   Patient will increase RUE P/ROM to [facility] to increase ability to complete ADLs and be able to go fishing.    Time  6    Period  Weeks      OT SHORT TERM GOAL #4   Title   Patient will decrease pain level in RUE during daily tasks to 5/10 or less.      Time  6    Period  Weeks    Status  On-going      OT SHORT TERM GOAL #5   Title  Patient will decrease fascial restrictions in RUE to min amount or less in order to increase functional mobility needed to complete functional reaching tasks.      Time  6    Period  Weeks    Status  On-going        OT Long Term Goals - 09/18/17 1051      OT LONG TERM GOAL #1   Title  Patient will return to highest level of  independence with all daily and leisure tasks while using her RUE as his dominant.      Time  12    Period  Weeks    Status  On-going      OT LONG TERM GOAL #2   Title  Patient will increase his RUE strength to 4/5 in order to return to be able to complete all daily routine tasks.    Time  12    Period  Weeks    Status  On-going      OT LONG TERM GOAL #3   Title   Patient will increase RUE A/ROM to [facility] to increase ability to complete functional reaching tasks above head.     Time  12    Period  Weeks    Status  On-going      OT LONG TERM GOAL #4   Title  Patient will report a decrease in pain level for RUE while completing daily tasks and fishing to 3/10 or less.      Time  12    Period  Weeks    Status  On-going      OT LONG TERM GOAL #5   Title  Patient will  decrease fascial restrictions to trace amount or less in RUE in order to increase functional mobility needed to complete tasks.      Time  12    Period  Weeks    Status  On-going            Plan - 11/29/17 1450    Clinical Impression Statement  A: Continued with manual therapy to address fascial restrictions in right upper arm and trapezius regions limiting ROM. Pt reporting decreased pain since last week, able to resume all A/ROM exercises this session. Pt also completing pulleys today, working towards achieving ROM WFL. Added proximal shoulder strengthening in sitting.  Verbal cuing for form and technique during exercises.     Plan  P: Resume theraband exercises, add pvc pipe slide       Patient will benefit from skilled therapeutic intervention in order to improve the following deficits and impairments:  Decreased range of motion, Decreased endurance, Decreased activity tolerance, Increased fascial restrictions, Impaired UE functional use, Pain, Decreased mobility, Decreased strength  Visit Diagnosis: Acute pain of right shoulder  Stiffness of right shoulder, not elsewhere classified  Other symptoms and signs involving the musculoskeletal system    Problem List Patient Active Problem List   Diagnosis Date Noted  . Dysphagia 07/10/2016  . Diarrhea 07/10/2016  . BMI 40.0-44.9, adult ([place]) 05/31/2015  . Neuropathy 03/17/2015  . Prostate cancer ([place]) 06/30/2013  . Abdominal aneurysm without mention of rupture 02/19/2013  . Need for prophylactic vaccination and inoculation against influenza 02/03/2013  . ANXIETY 01/14/2013  . OBESITY NOS 01/14/2013  . VITAMIN B12 DEFICIENCY 01/14/2013  . TOBACCO ABUSE 01/14/2013  . PTSD 01/14/2013  . COPD (chronic obstructive pulmonary disease) ([place])   . OSA (obstructive sleep apnea) 08/14/2012  . RLS (restless legs syndrome) 08/14/2012  . Arthritis of both knees 08/14/2012  . Prediabetes 08/14/2012  . Nephrolithiasis 08/14/2012  .  Aortic atherosclerosis ([place]) 02/19/2012  . Coronary atherosclerosis of native coronary artery 03/23/2010  . CHRONIC KIDNEY DISEASE STAGE II ([facility]) 10/13/2009  . GERD 07/30/2008  . BENIGN PROSTATIC HYPERTROPHY, WITH OBSTRUCTION 02/20/2008  . Hyperlipidemia 01/31/2007  . GOUT 01/31/2007  . Essential hypertension, benign 01/31/2007   Guadelupe Sabin, OTR/L  [phone] 11/29/2017, 2:53 PM  [facility]  [place], [place], [address] Phone: [phone]   Fax:  [phone]  Name: [name] MRN: [id number] Date of Birth: [birth year]

## 2017-12-04 ENCOUNTER — Ambulatory Visit (INDEPENDENT_AMBULATORY_CARE_PROVIDER_SITE_OTHER): Payer: Medicare Other

## 2017-12-04 ENCOUNTER — Encounter: Payer: Self-pay | Admitting: Family

## 2017-12-04 ENCOUNTER — Ambulatory Visit (INDEPENDENT_AMBULATORY_CARE_PROVIDER_SITE_OTHER): Payer: Medicare Other | Admitting: Family

## 2017-12-04 ENCOUNTER — Telehealth (HOSPITAL_COMMUNITY): Payer: Self-pay | Admitting: Occupational Therapy

## 2017-12-04 ENCOUNTER — Ambulatory Visit (HOSPITAL_COMMUNITY): Payer: Medicare Other | Admitting: Occupational Therapy

## 2017-12-04 VITALS — BP 139/88 | HR 72 | Temp 97.2°F | Ht 68.0 in | Wt 280.8 lb

## 2017-12-04 DIAGNOSIS — M48061 Spinal stenosis, lumbar region without neurogenic claudication: Secondary | ICD-10-CM | POA: Diagnosis not present

## 2017-12-04 DIAGNOSIS — Z6841 Body Mass Index (BMI) 40.0 and over, adult: Secondary | ICD-10-CM | POA: Diagnosis not present

## 2017-12-04 DIAGNOSIS — C61 Malignant neoplasm of prostate: Secondary | ICD-10-CM

## 2017-12-04 DIAGNOSIS — M545 Low back pain, unspecified: Secondary | ICD-10-CM

## 2017-12-04 LAB — URINALYSIS, COMPLETE
Bilirubin, UA: NEGATIVE
Glucose, UA: NEGATIVE
Ketones, UA: NEGATIVE
Leukocytes, UA: NEGATIVE
Nitrite, UA: NEGATIVE
Specific Gravity, UA: 1.025 (ref 1.005–1.030)
Urobilinogen, Ur: 0.2 mg/dL (ref 0.2–1.0)
pH, UA: 6.5 (ref 5.0–7.5)

## 2017-12-04 LAB — MICROSCOPIC EXAMINATION
Bacteria, UA: NONE SEEN
Renal Epithel, UA: NONE SEEN /hpf
WBC, UA: NONE SEEN /hpf (ref 0–5)

## 2017-12-04 MED ORDER — BACLOFEN 10 MG PO TABS: 10.0000 mg | ORAL_TABLET | Freq: Three times a day (TID) | ORAL | 0 refills | Status: DC

## 2017-12-04 MED ORDER — PREDNISONE 10 MG (21) PO TBPK: ORAL_TABLET | ORAL | 0 refills | Status: DC

## 2017-12-04 NOTE — Progress Notes (Signed)
   Subjective:    Patient ID: [name], male    DOB: 1945-05-16, 72 y.o.   MRN: [id number]  Chief Complaint  Patient presents with  . Back Pain    Back Pain  This is a new problem. The current episode started in the past 7 days. The problem occurs intermittently. The problem has been waxing and waning since onset. The pain is present in the lumbar spine. The quality of the pain is described as shooting. The pain does not radiate. The pain is at a severity of 2/10 (9-10 when spasm). The pain is moderate. The symptoms are aggravated by twisting. Pertinent negatives include no bowel incontinence, dysuria or weakness. Risk factors include obesity. He has tried bed rest for the symptoms. The treatment provided no relief.      Review of Systems  Gastrointestinal: Negative for bowel incontinence.  Genitourinary: Negative for dysuria.  Musculoskeletal: Positive for back pain.  Neurological: Negative for weakness.  All other systems reviewed and are negative.      Objective:   Physical Exam  Constitutional: He is oriented to person, place, and time. He appears well-developed and well-nourished. No distress.  HENT:  Head: Normocephalic.  Right Ear: External ear normal.  Left Ear: External ear normal.  Mouth/Throat: Oropharynx is clear and moist.  Eyes: Pupils are equal, round, and reactive to light. Right eye exhibits no discharge. Left eye exhibits no discharge.  Neck: Normal range of motion. Neck supple. No thyromegaly present.  Cardiovascular: Normal rate, regular rhythm, normal heart sounds and intact distal pulses.  No murmur heard. Pulmonary/Chest: Effort normal and breath sounds normal. No respiratory distress. He has no wheezes.  Abdominal: Soft. Bowel sounds are normal. He exhibits no distension. There is no tenderness.  Musculoskeletal: Normal range of motion. He exhibits tenderness. He exhibits no edema.  Lumbar pain with extension  Neurological: He is alert and  oriented to person, place, and time. He has normal reflexes. No cranial nerve deficit.  Skin: Skin is warm and dry. No rash noted. No erythema.  Psychiatric: He has a normal mood and affect. His behavior is normal. Judgment and thought content normal.  Vitals reviewed.     BP 139/88   Pulse 72   Temp (!) 97.2 F (36.2 C)   Ht 5\' 8"  (1.727 m)   Wt 280 lb 12.8 oz (127.4 kg)   BMI 42.70 kg/m      Assessment & Plan:  [name] comes in today with chief complaint of Back Pain   Diagnosis and orders addressed:  1. Acute low back pain without sciatica, unspecified back pain laterality Rest Ice ROM exercises  Continue PT  Keep follow up with PCP - Urinalysis, Complete - DG Lumbar Spine 2-3 Views; Future - predniSONE (STERAPRED UNI-PAK 21 TAB) 10 MG (21) TBPK tablet; Use as directed  Dispense: 21 tablet; Refill: 0 - baclofen (LIORESAL) 10 MG tablet; Take 1 tablet (10 mg total) by mouth 3 (three) times daily.  Dispense: 30 each; Refill: 0  2. Prostate cancer [facility]) Keep Urologists appts   3. Class 3 severe obesity with serious comorbidity and body mass index (BMI) of 40.0 to 44.9 in adult, unspecified obesity type ([place])   Evelina Dun, FNP

## 2017-12-04 NOTE — Patient Instructions (Signed)
Low Back Strain A strain is a stretch or tear in a muscle or the strong cords of tissue that attach muscle to bone (tendons). Strains of the lower back (lumbar spine) are a common cause of low back pain. A strain occurs when muscles or tendons are torn or are stretched beyond their limits. The muscles may become inflamed, resulting in pain and sudden muscle tightening (spasms). A strain can happen suddenly due to an injury (trauma), or it can develop gradually due to overuse. There are three types of strains:  Grade 1 is a mild strain involving a minor tear of the muscle fibers or tendons. This may cause some pain but no loss of muscle strength.  Grade 2 is a moderate strain involving a partial tear of the muscle fibers or tendons. This causes more severe pain and some loss of muscle strength.  Grade 3 is a severe strain involving a complete tear of the muscle or tendon. This causes severe pain and complete or nearly complete loss of muscle strength. What are the causes? This condition may be caused by:  Trauma, such as a fall or a hit to the body.  Twisting or overstretching the back. This may result from doing activities that require a lot of energy, such as lifting heavy objects. What increases the risk? The following factors may increase your risk of getting this condition:  Playing contact sports.  Participating in sports or activities that put excessive stress on the back and require a lot of bending and twisting, including:  Lifting weights or heavy objects.  Gymnastics.  Soccer.  Figure skating.  Snowboarding.  Being overweight or obese.  Having poor strength and flexibility. What are the signs or symptoms? Symptoms of this condition may include:  Sharp or dull pain in the lower back that does not go away. Pain may extend to the buttocks.  Stiffness.  Limited range of motion.  Inability to stand up straight due to stiffness or pain.  Muscle spasms. How is this  diagnosed?   This condition may be diagnosed based on:  Your symptoms.  Your medical history.  A physical exam.  Your health care provider may push on certain areas of your back to determine the source of your pain.  You may be asked to bend forward, backward, and side to side to assess the severity of your pain and your range of motion.  Imaging tests, such as:  X-rays.  MRI. How is this treated? Treatment for this condition may include:  Applying heat and cold to the affected area.  Medicines to help relieve pain and to relax your muscles (muscle relaxants).  NSAIDs to help reduce swelling and discomfort.  Physical therapy. When your symptoms improve, it is important to gradually return to your normal routine as soon as possible to reduce pain, avoid stiffness, and avoid loss of muscle strength. Generally, symptoms should improve within 6 weeks of treatment. However, recovery time varies. Follow these instructions at home: Managing pain, stiffness, and swelling  If directed, apply ice to the injured area during the first 24 hours after your injury.  Put ice in a plastic bag.  Place a towel between your skin and the bag.  Leave the ice on for 20 minutes, 2-3 times a day.  If directed, apply heat to the affected area as often as told by your health care provider. Use the heat source that your health care provider recommends, such as a moist heat pack or a heating pad.    Place a towel between your skin and the heat source.  Leave the heat on for 20-30 minutes.  Remove the heat if your skin turns bright red. This is especially important if you are unable to feel pain, heat, or cold. You may have a greater risk of getting burned. Activity  Rest and return to your normal activities as told by your health care provider. Ask your health care provider what activities are safe for you.  Avoid activities that take a lot of effort (are strenuous) for as long as told by your  health care provider.  Do exercises as told by your health care provider. General instructions  Take over-the-counter and prescription medicines only as told by your health care provider.  If you have questions or concerns about safety while taking pain medicine, talk with your health care provider.  Do not drive or operate heavy machinery until you know how your pain medicine affects you.  Do not use any tobacco products, such as cigarettes, chewing tobacco, and e-cigarettes. Tobacco can delay bone healing. If you need help quitting, ask your health care provider.  Keep all follow-up visits as told by your health care provider. This is important. How is this prevented?  Warm up and stretch before being active.  Cool down and stretch after being active.  Give your body time to rest between periods of activity.  Avoid:  Being physically inactive for long periods at a time.  Exercising or playing sports when you are tired or in pain.  Use correct form when playing sports and lifting heavy objects.  Use good posture when sitting and standing.  Maintain a healthy weight.  Sleep on a mattress with medium firmness to support your back.  Make sure to use equipment that fits you, including shoes that fit well.  Be safe and responsible while being active to avoid falls.  Do at least 150 minutes of moderate-intensity exercise each week, such as brisk walking or water aerobics. Try a form of exercise that takes stress off your back, such as swimming or stationary cycling.  Maintain physical fitness, including:  Strength.  Flexibility.  Cardiovascular fitness.  Endurance. Contact a health care provider if:  Your back pain does not improve after 6 weeks of treatment.  Your symptoms get worse. Get help right away if:  Your back pain is severe.  You are unable to stand or walk.  You develop pain in your legs.  You develop weakness in your buttocks or legs.  You have  difficulty controlling when you urinate or when you have a bowel movement. This information is not intended to replace advice given to you by your health care provider. Make sure you discuss any questions you have with your health care provider. Document Released: 03/20/2005 Document Revised: 11/25/2015 Document Reviewed: 12/30/2014 Elsevier Interactive Patient Education  2017 [employer].  

## 2017-12-04 NOTE — Telephone Encounter (Signed)
Patient came in with LBP and did'nt feel up to rehab today.

## 2017-12-05 ENCOUNTER — Telehealth (HOSPITAL_COMMUNITY): Payer: Self-pay | Admitting: Occupational Therapy

## 2017-12-05 NOTE — Telephone Encounter (Signed)
He has another MD apptment and can not come in tomorrow. NF 12/05/17

## 2017-12-06 ENCOUNTER — Ambulatory Visit (HOSPITAL_COMMUNITY): Payer: Medicare Other | Admitting: Occupational Therapy

## 2017-12-06 DIAGNOSIS — S42291A Other displaced fracture of upper end of right humerus, initial encounter for closed fracture: Secondary | ICD-10-CM | POA: Diagnosis not present

## 2017-12-07 ENCOUNTER — Ambulatory Visit (INDEPENDENT_AMBULATORY_CARE_PROVIDER_SITE_OTHER): Payer: Medicare Other | Admitting: *Deleted

## 2017-12-07 DIAGNOSIS — E538 Deficiency of other specified B group vitamins: Secondary | ICD-10-CM

## 2017-12-07 NOTE — Progress Notes (Signed)
Pt given Cyanocobalamin inj Tolerated well 

## 2017-12-11 ENCOUNTER — Ambulatory Visit (HOSPITAL_COMMUNITY): Payer: Medicare Other | Attending: Orthopedic Surgery | Admitting: Occupational Therapy

## 2017-12-11 ENCOUNTER — Encounter (HOSPITAL_COMMUNITY): Payer: Self-pay | Admitting: Occupational Therapy

## 2017-12-11 DIAGNOSIS — M25611 Stiffness of right shoulder, not elsewhere classified: Secondary | ICD-10-CM | POA: Diagnosis not present

## 2017-12-11 DIAGNOSIS — R29898 Other symptoms and signs involving the musculoskeletal system: Secondary | ICD-10-CM | POA: Diagnosis not present

## 2017-12-11 DIAGNOSIS — M25511 Pain in right shoulder: Secondary | ICD-10-CM | POA: Diagnosis not present

## 2017-12-11 NOTE — Therapy (Addendum)
[place], [place], [address] Phone: [phone]   Fax:  [phone]  Occupational Therapy Treatment and Reassessment (and Recertification)  Patient Details  Name: [name] MRN: [id number] Date of Birth: 1945/05/11 Referring Provider: Dr. Victorino December   Progress Note Reporting Period 10/16/2017 to 12/11/2017  See note below for Objective Data and Assessment of Progress/Goals.       Encounter Date: 12/11/2017  OT End of Session - 12/11/17 1450    Visit Number  13    Number of Visits  24    Date for OT Re-Evaluation  01/10/18    Authorization Type  Primary insurance is Medicare; secondary is BCBC; visit limit of 60 with 7 used.    Authorization Time Period  per calendar year    Authorization - Visit Number  19    Authorization - Number of Visits  50    OT Start Time  3785    OT Stop Time  1435    OT Time Calculation (min)  50 min    Activity Tolerance  Patient tolerated treatment well    Behavior During Therapy  WFL for tasks assessed/performed       Past Medical History:  Diagnosis Date  . Aortic atherosclerosis ([place]) followed by dr Trula Slade   2004  s/p  closure Penetrating atherosclerotic ulcer of infarenal aorta w/ endovascular stent graft  . Arthritis   . CKD (chronic kidney disease), stage III ([place])   . Closed fracture of head of humerus 07/2017   right shoulder from fall; 09-25-2017 per pt no surgical intervention, only wore sling, intermittant pain  . Coronary atherosclerosis of native coronary artery cardiologist-  dr Zigmund Gottron   positive myoview for ischemia 09-27-2009;  10-01-2009 per cardiac cath-- minimal nonobstructive CAD w/ 30% LAD   . ED (erectile dysfunction)   . Emphysema/COPD [facility])    followed by pcp--- last exacerbation 09-12-2017;  09-25-2017 per pt no cough, sob or congestion  . Essential hypertension, benign   . First degree heart block   . Frequency of urination   .  GERD (gastroesophageal reflux disease)   . Gout    09-25-2017  per pt stable,  last episode 2015  . Heart murmur   . History of adenomatous polyp of colon   . History of closed head injury 2005   per pt residual resolved  . History of external beam radiation therapy    completed 10/ 2015 for prostate cancer  . History of gastric ulcer 2014  . History of kidney stones   . History of pneumothorax    spontaneous pneumo treated w/ chest tube  . History of rib fracture 07/2017   from fall,  right side 6th,7th,8th  . Neuropathy   . OAB (overactive bladder)   . OSA (obstructive sleep apnea)    Intolerant of CPAP  . Prostate cancer [facility]) urologist-  dr wrenn/  oncologist-- dr Alen Blew and dr Tammi Klippel   dx 09-26-2011 via bx-- Stage T3b,N0,  Gleason 4+4, PSA 11.2 with METs to external iliac lymph node(resolved with ADT)-- treated w/ ADT ;   05/2013 staging work-up for rising PSA , started casodex and completed radiation therapy 10/ 2015;   rising PSA post treatment  . RLS (restless legs syndrome)   . Ureteral neocystostomy bleed    left side  . Urge urinary incontinence     Past Surgical History:  Procedure Laterality Date  . ABDOMINAL AORTIC ENDOVASCULAR STENT GRAFT  09-13-2007    dr [name]  [facility]   closure penetrating atherosclerotic ulcer of infarenal aorta with stent graft  . CARDIAC CATHETERIZATION  2005   per pt normal (done in [place])  . CARDIAC CATHETERIZATION  10/06/2009    dr cooper   minimal nonobstructive CAD w/30% LAD otherwise normal coronaries, LVEDP 87mHg  . CARDIOVASCULAR STRESS TEST  09/27/2009    dr mAundra Dubin  lexiscan nuclear study w/ moderate reversible inferior perfusion defect ischemia,  ef 60% (cardiac cath scheduled)  . CHEST TUBE INSERTION  1989   "collapsed lung"due to injury  . CIRCUMCISION  09/26/2011   Procedure: CIRCUMCISION ADULT;  Surgeon: JMalka So MD;  Location: [facility];  Service: Urology;  Laterality: N/A;  . COLONOSCOPY W/ POLYPECTOMY    .  COLONOSCOPY WITH PROPOFOL N/A 04/27/2016   Procedure: COLONOSCOPY WITH PROPOFOL;  Surgeon: RDaneil Dolin MD;  [facility];  Service: Endoscopy;  Laterality: N/A;  1115-moved to 845 per Ginger  . CYSTOSCOPY  09/26/2011   Procedure: CYSTOSCOPY;  Surgeon: JMalka So MD;  Location: [facility];  Service: Urology;  Laterality: N/A;  . CYSTOSCOPY/RETROGRADE/URETEROSCOPY Left 09/27/2017   Procedure: CYSTOSCOPYLEFT /Marlow BaarsAND RENAL WASHINGS;  Surgeon: WIrine Seal MD;  [facility]  Service: Urology;  Laterality: Left;  . ESOPHAGOGASTRODUODENOSCOPY (EGD) WITH PROPOFOL N/A 04/27/2016   Procedure: ESOPHAGOGASTRODUODENOSCOPY (EGD) WITH PROPOFOL;  Surgeon: RDaneil Dolin MD;  [facility];  Service: Endoscopy;  Laterality: N/A;  . EXTRACORPOREAL SHOCK WAVE LITHOTRIPSY  yrs ago  . FRACTURE SURGERY  child   Bilateral lower arms   . KNEE ARTHROSCOPY Right 2013  . MALONEY DILATION N/A 04/27/2016   Procedure: MVenia MinksDILATION;  Surgeon: RDaneil Dolin MD;  [facility];  Service: Endoscopy;  Laterality: N/A;  . PARTIAL KNEE ARTHROPLASTY Right 04/26/2015   Procedure: RIGHT KNEE MEDIAL UNICOMPARTMENTAL ARTHROPLASTY;  Surgeon: FGaynelle Arabian MD;  [facility]: [facility];  Service: Orthopedics;  Laterality: Right;  . POLYPECTOMY  04/27/2016   Procedure: POLYPECTOMY;  Surgeon: RDaneil Dolin MD;  [facility];  Service: Endoscopy;;  cecal , ascending, descending, and sigmoid polypectomies  . PROSTATE BIOPSY  09/26/2011   Procedure: BIOPSY TRANSRECTAL ULTRASONIC PROSTATE (TUBP);  Surgeon: JMalka So MD;  [facility];  Service: Urology;  Laterality: N/A;     . TRANSTHORACIC ECHOCARDIOGRAM  01-07-2013   dr mDomenic Polite  ef 60-65%,  grade 1 diastolic dysfunction/  mild AR without stenosis/  mild LAE/  mild to moderate calcificed MV annulus without regurg. or stenosis/  . UMBILICAL HERNIA REPAIR  yrs ago    There were no vitals filed for  this visit.  Subjective Assessment - 12/11/17 1348    Subjective   S: The doctor said that he was happy I've been going to therapy and that I'm doing better.     Currently in Pain?  Yes    Pain Score  2     Pain Location  Shoulder    Pain Orientation  Right    Pain Descriptors / Indicators  Constant    Pain Type  Acute pain    Pain Onset  More than a month ago    Pain Frequency  Constant    Aggravating Factors   Movement, Use of arm    Pain Relieving Factors  Medication     Effect of Pain on Daily Activities  Moderate effect on daily activities    Multiple Pain  Sites  No         [facility] OT Assessment - 12/11/17 1347      Assessment   Medical Diagnosis  right shoulder pain secondary to right proximal humerus fracture      Precautions   Precautions  Shoulder    Type of Shoulder Precautions  Progress as tolerated.       Observation/Other Assessments   Focus on Therapeutic Outcomes (FOTO)   56/100   Previous 43/100     ROM / Strength   AROM / PROM / Strength  AROM;PROM;Strength      AROM   Overall AROM Comments  assessed seated; IR/er adducted     AROM Assessment Site  Shoulder    Right/Left Shoulder  Right    Right Shoulder Flexion  150 Degrees   Previous 168   Right Shoulder ABduction  135 Degrees   Previous 125   Right Shoulder Internal Rotation  90 Degrees   Previous 90   Right Shoulder External Rotation  56 Degrees   Previous 60     PROM   Overall PROM Comments  assessed supine; IR/er adducted    PROM Assessment Site  Shoulder    Right/Left Shoulder  Right    Right Shoulder Flexion  145 Degrees   Previous 170   Right Shoulder ABduction  140 Degrees   Previous 180   Right Shoulder Internal Rotation  90 Degrees   Previous 90   Right Shoulder External Rotation  90 Degrees   Previous 60     Strength   Overall Strength  --    Overall Strength Comments  Assessed seated; IR/er adducted    Strength Assessment Site  Shoulder    Right/Left Shoulder  Right     Right Shoulder Flexion  4/5   Not previously assessed   Right Shoulder Extension  4/5   Not previously assessed   Right Shoulder ABduction  4/5   Not previously assessed   Right Shoulder Internal Rotation  5/5   Not previously assessed   Right Shoulder External Rotation  4/5   Not previously assessed                      OT Treatments/Exercises (OP) - 12/11/17 1349      Exercises   Exercises  Shoulder      Shoulder Exercises: Supine   Protraction  PROM;5 reps;AROM;10 reps    Horizontal ABduction  PROM;5 reps;AROM;10 reps    External Rotation  PROM;5 reps;AROM;10 reps    Internal Rotation  PROM;5 reps;AROM;10 reps    Flexion  PROM;5 reps;AROM;10 reps    ABduction  PROM;5 reps;AROM;10 reps      Shoulder Exercises: Seated   Protraction  AROM;10 reps    Horizontal ABduction  AROM;10 reps    External Rotation  AROM;10 reps    Internal Rotation  AROM;10 reps    Flexion  AROM;10 reps    Abduction  AROM;10 reps      Shoulder Exercises: Standing   Extension  Theraband;10 reps    Theraband Level (Shoulder Extension)  Level 2 (Red)    Row  Theraband;10 reps    Theraband Level (Shoulder Row)  Level 2 (Red)    Retraction  Theraband;10 reps    Theraband Level (Shoulder Retraction)  Level 2 (Red)      Manual Therapy   Manual Therapy  Myofascial release    Manual therapy comments  Myofascial release performed seperate from other exercises.  Myofascial Release  Myofascial release performed on upper arm, trapezius, and scapularis region to reduce fascial restrictions in order to increase ROM and decrease pain.               OT Short Term Goals - 12/11/17 1505      OT SHORT TERM GOAL #1   Title  Patient will be educated and independent with HEP to faciliate progress in therapy and allow him to return to using his RUE as his dominant extremity for all daily tasks.    Time  6    Period  Weeks    Status  On-going      OT SHORT TERM GOAL #2   Title  Patient will  increase RUE strength to 3+/5 in order to complete daily and leisure tasks.    Time  6    Period  Weeks    Status  Achieved      OT SHORT TERM GOAL #3   Title   Patient will increase RUE P/ROM to [facility] to increase ability to complete ADLs and be able to go fishing.    Time  6    Period  Weeks    Status  Achieved      OT SHORT TERM GOAL #4   Title   Patient will decrease pain level in RUE during daily tasks to 5/10 or less.      Time  6    Period  Weeks    Status  Achieved      OT SHORT TERM GOAL #5   Title  Patient will decrease fascial restrictions in RUE to min amount or less in order to increase functional mobility needed to complete functional reaching tasks.      Time  6    Period  Weeks    Status  Partially Met        OT Long Term Goals - 12/11/17 1507      OT LONG TERM GOAL #1   Title  Patient will return to highest level of independence with all daily and leisure tasks while using her RUE as his dominant.      Time  12    Period  Weeks    Status  On-going      OT LONG TERM GOAL #2   Title  Patient will increase his RUE strength to 4+/5 in order to return to be able to complete all daily routine tasks.    Baseline  At reassessment today, 12/11/17, pt demonstrates strength as 4/5.     Time  12    Period  Weeks    Status  Revised      OT LONG TERM GOAL #3   Title   Patient will increase RUE A/ROM to [facility] to increase ability to complete functional reaching tasks above head.     Time  12    Period  Weeks    Status  Achieved      OT LONG TERM GOAL #4   Title  Patient will report a decrease in pain level for RUE while completing daily tasks and fishing to 3/10 or less.      Time  12    Period  Weeks    Status  On-going      OT LONG TERM GOAL #5   Title  Patient will decrease fascial restrictions to trace amount or less in RUE in order to increase functional mobility needed to complete tasks.      Time  12    Period  Weeks    Status  On-going             Plan - 12/11/17 1452    Clinical Impression Statement  A: Pt participated in reassessment this session. He is showing steady progress towards his goals, having met 3/5 STGs and 1/5 LTGs. Pt reports that he cannot perform tasks, such as putting away dishes at home due to strength limitations. Continued shoulder strengthening and stabilization will help to improve his functional use of right upper extremity, improving independence. Going forward sessions will focus on improving functional strength within available ROM.     Rehab Potential  Good    OT Frequency  2x / week    OT Duration  4 weeks    OT Treatment/Interventions  Self-care/ADL training;Therapeutic exercise;Therapeutic activities;Cryotherapy;Moist Heat;Ultrasound;Paraffin;Manual Therapy;Passive range of motion;Patient/family education    Plan  P: Pt will resume pulley exercises next session. Introduce PVC pipe slide next session.     Clinical Decision Making  Limited treatment options, no task modification necessary    Consulted and Agree with Plan of Care  Patient       Patient will benefit from skilled therapeutic intervention in order to improve the following deficits and impairments:  Decreased range of motion, Decreased endurance, Decreased activity tolerance, Increased fascial restrictions, Impaired UE functional use, Pain, Decreased mobility, Decreased strength  Visit Diagnosis: Acute pain of right shoulder  Other symptoms and signs involving the musculoskeletal system  Stiffness of right shoulder, not elsewhere classified    Problem List Patient Active Problem List   Diagnosis Date Noted  . Dysphagia 07/10/2016  . Diarrhea 07/10/2016  . BMI 40.0-44.9, adult ([place]) 05/31/2015  . Neuropathy 03/17/2015  . Prostate cancer ([place]) 06/30/2013  . Abdominal aneurysm without mention of rupture 02/19/2013  . Need for prophylactic vaccination and inoculation against influenza 02/03/2013  . ANXIETY 01/14/2013  .  OBESITY NOS 01/14/2013  . VITAMIN B12 DEFICIENCY 01/14/2013  . TOBACCO ABUSE 01/14/2013  . PTSD 01/14/2013  . COPD (chronic obstructive pulmonary disease) ([place])   . OSA (obstructive sleep apnea) 08/14/2012  . RLS (restless legs syndrome) 08/14/2012  . Arthritis of both knees 08/14/2012  . Prediabetes 08/14/2012  . Nephrolithiasis 08/14/2012  . Aortic atherosclerosis ([place]) 02/19/2012  . Coronary atherosclerosis of native coronary artery 03/23/2010  . CHRONIC KIDNEY DISEASE STAGE II ([place]) 10/13/2009  . GERD 07/30/2008  . BENIGN PROSTATIC HYPERTROPHY, WITH OBSTRUCTION 02/20/2008  . Hyperlipidemia 01/31/2007  . GOUT 01/31/2007  . Essential hypertension, benign 01/31/2007    Toney Rakes, OT Student  12/11/2017, 6:16 PM  [facility] [address] [place], [place], [address] Phone: [phone]   Fax:  [phone]  Name: [name] MRN: [id number] Date of Birth: [birth year]     This qualified practitioner was present in the room guiding the student in service delivery. Therapy student was participating in the provision of services, and the practitioner was not engaged in treating another patient or doing other tasks at the same time.  Guadelupe Sabin, OTR/L

## 2017-12-12 ENCOUNTER — Other Ambulatory Visit: Payer: Self-pay | Admitting: Family Medicine

## 2017-12-12 DIAGNOSIS — R42 Dizziness and giddiness: Secondary | ICD-10-CM | POA: Diagnosis not present

## 2017-12-12 NOTE — Addendum Note (Signed)
Addended by: Guadelupe Sabin A on: 12/12/2017 07:08 AM   Modules accepted: Orders

## 2017-12-13 ENCOUNTER — Encounter (HOSPITAL_COMMUNITY): Payer: Medicare Other | Admitting: Occupational Therapy

## 2017-12-13 ENCOUNTER — Other Ambulatory Visit: Payer: Self-pay | Admitting: *Deleted

## 2017-12-13 DIAGNOSIS — I10 Essential (primary) hypertension: Secondary | ICD-10-CM

## 2017-12-13 MED ORDER — AMLODIPINE BESYLATE 10 MG PO TABS: 10.0000 mg | ORAL_TABLET | Freq: Every day | ORAL | 0 refills | Status: DC

## 2017-12-17 ENCOUNTER — Telehealth (HOSPITAL_COMMUNITY): Payer: Self-pay | Admitting: Family Medicine

## 2017-12-17 NOTE — Telephone Encounter (Signed)
12/17/17  pt called to cancel he said that he confirmed appt on call tree but wanted to let us know he had a conflict

## 2017-12-18 ENCOUNTER — Ambulatory Visit (HOSPITAL_COMMUNITY): Payer: Medicare Other

## 2017-12-20 ENCOUNTER — Encounter (HOSPITAL_COMMUNITY): Payer: Self-pay

## 2017-12-20 ENCOUNTER — Ambulatory Visit (HOSPITAL_COMMUNITY): Payer: Medicare Other

## 2017-12-20 DIAGNOSIS — R29898 Other symptoms and signs involving the musculoskeletal system: Secondary | ICD-10-CM

## 2017-12-20 DIAGNOSIS — M25611 Stiffness of right shoulder, not elsewhere classified: Secondary | ICD-10-CM

## 2017-12-20 DIAGNOSIS — M25511 Pain in right shoulder: Secondary | ICD-10-CM

## 2017-12-20 NOTE — Therapy (Addendum)
[place], [place], [address] Phone: [phone]   Fax:  [phone]  Occupational Therapy Treatment  Patient Details  Name: [name] MRN: [id number] Date of Birth: May 26, 1945 Referring Provider: Dr. Victorino December   Encounter Date: 12/20/2017  OT End of Session - 12/20/17 1341    Visit Number  14    Number of Visits  24    Date for OT Re-Evaluation  01/10/18    Authorization Type  Primary insurance is Medicare; secondary is BCBC; visit limit of 60 with 7 used.    Authorization Time Period  per calendar year    Authorization - Visit Number  34    Authorization - Number of Visits  45    OT Start Time  1517    OT Stop Time  1330   Pt requested to leave early, reporting feeling dizzy and nauseated    OT Time Calculation (min)  28 min    Activity Tolerance  Patient limited by pain;Treatment limited secondary to medical complications (Comment)    Behavior During Therapy  [facility] for tasks assessed/performed       Past Medical History:  Diagnosis Date  . Aortic atherosclerosis ([place]) followed by dr Trula Slade   2004  s/p  closure Penetrating atherosclerotic ulcer of infarenal aorta w/ endovascular stent graft  . Arthritis   . CKD (chronic kidney disease), stage III ([place])   . Closed fracture of head of humerus 07/2017   right shoulder from fall; 09-25-2017 per pt no surgical intervention, only wore sling, intermittant pain  . Coronary atherosclerosis of native coronary artery cardiologist-  dr Zigmund Gottron   positive myoview for ischemia 09-27-2009;  10-01-2009 per cardiac cath-- minimal nonobstructive CAD w/ 30% LAD   . ED (erectile dysfunction)   . Emphysema/COPD [facility])    followed by pcp--- last exacerbation 09-12-2017;  09-25-2017 per pt no cough, sob or congestion  . Essential hypertension, benign   . First degree heart block   . Frequency of urination   . GERD (gastroesophageal reflux disease)   . Gout    09-25-2017  per pt stable,  last episode 2015  . Heart murmur   . History of adenomatous polyp of colon   . History of closed head injury 2005   per pt residual resolved  . History of external beam radiation therapy    completed 10/ 2015 for prostate cancer  . History of gastric ulcer 2014  . History of kidney stones   . History of pneumothorax    spontaneous pneumo treated w/ chest tube  . History of rib fracture 07/2017   from fall,  right side 6th,7th,8th  . Neuropathy   . OAB (overactive bladder)   . OSA (obstructive sleep apnea)    Intolerant of CPAP  . Prostate cancer [facility]) urologist-  dr wrenn/  oncologist-- dr Alen Blew and dr Tammi Klippel   dx 09-26-2011 via bx-- Stage T3b,N0,  Gleason 4+4, PSA 11.2 with METs to external iliac lymph node(resolved with ADT)-- treated w/ ADT ;   05/2013 staging work-up for rising PSA , started casodex and completed radiation therapy 10/ 2015;   rising PSA post treatment  . RLS (restless legs syndrome)   . Ureteral neocystostomy bleed    left side  . Urge urinary incontinence     Past Surgical History:  Procedure Laterality Date  . ABDOMINAL AORTIC ENDOVASCULAR STENT GRAFT  09-13-2007    dr [name]  [facility]   closure  penetrating atherosclerotic ulcer of infarenal aorta with stent graft  . CARDIAC CATHETERIZATION  2005   per pt normal (done in [place])  . CARDIAC CATHETERIZATION  10/06/2009    dr cooper   minimal nonobstructive CAD w/30% LAD otherwise normal coronaries, LVEDP 74mmHg  . CARDIOVASCULAR STRESS TEST  09/27/2009    dr Aundra Dubin   lexiscan nuclear study w/ moderate reversible inferior perfusion defect ischemia,  ef 60% (cardiac cath scheduled)  . CHEST TUBE INSERTION  1989   "collapsed lung"due to injury  . CIRCUMCISION  09/26/2011   Procedure: CIRCUMCISION ADULT;  Surgeon: [name], MD;  Location: [facility];  Service: Urology;  Laterality: N/A;  . COLONOSCOPY W/ POLYPECTOMY    . COLONOSCOPY WITH PROPOFOL N/A 04/27/2016   Procedure:  COLONOSCOPY WITH PROPOFOL;  Surgeon: [name], MD;  [facility];  Service: Endoscopy;  Laterality: N/A;  1115-moved to 845 per [place]  . CYSTOSCOPY  09/26/2011   Procedure: CYSTOSCOPY;  Surgeon: [name], MD;  Location: [facility];  Service: Urology;  Laterality: N/A;  . CYSTOSCOPY/RETROGRADE/URETEROSCOPY Left 09/27/2017   Procedure: CYSTOSCOPYLEFT Marlow Baars AND RENAL WASHINGS;  Surgeon: [name], MD;  Location: [facility];  Service: Urology;  Laterality: Left;  . ESOPHAGOGASTRODUODENOSCOPY (EGD) WITH PROPOFOL N/A 04/27/2016   Procedure: ESOPHAGOGASTRODUODENOSCOPY (EGD) WITH PROPOFOL;  Surgeon: [name], MD;  [facility];  Service: Endoscopy;  Laterality: N/A;  . EXTRACORPOREAL SHOCK WAVE LITHOTRIPSY  yrs ago  . FRACTURE SURGERY  child   Bilateral lower arms   . KNEE ARTHROSCOPY Right 2013  . MALONEY DILATION N/A 04/27/2016   Procedure: Venia Minks DILATION;  Surgeon: [name], MD;  [facility];  Service: Endoscopy;  Laterality: N/A;  . PARTIAL KNEE ARTHROPLASTY Right 04/26/2015   Procedure: RIGHT KNEE MEDIAL UNICOMPARTMENTAL ARTHROPLASTY;  Surgeon: Gaynelle Arabian, MD;  Location: [facility];  Service: Orthopedics;  Laterality: Right;  . POLYPECTOMY  04/27/2016   Procedure: POLYPECTOMY;  Surgeon: [name], MD;  [facility];  Service: Endoscopy;;  cecal , ascending, descending, and sigmoid polypectomies  . PROSTATE BIOPSY  09/26/2011   Procedure: BIOPSY TRANSRECTAL ULTRASONIC PROSTATE (TUBP);  Surgeon: [name], MD;  [facility];  Service: Urology;  Laterality: N/A;     . TRANSTHORACIC ECHOCARDIOGRAM  01-07-2013   dr [name]   ef 60-65%,  grade 1 diastolic dysfunction/  mild AR without stenosis/  mild LAE/  mild to moderate calcificed MV annulus without regurg. or stenosis/  . UMBILICAL HERNIA REPAIR  yrs ago    There were no vitals filed for this visit.  Subjective Assessment - 12/20/17 1307     Subjective   S: I've been getting dizzy and my arm has been sore this week but I've had to go back to working part of the time.     Currently in Pain?  Yes    Pain Score  7     Pain Location  Shoulder    Pain Orientation  Right    Pain Descriptors / Indicators  Sore    Pain Type  Acute pain    Pain Radiating Towards  N/A    Pain Onset  In the past 7 days    Pain Frequency  Intermittent    Aggravating Factors   Exercise    Pain Relieving Factors  Medication    Effect of Pain on Daily Activities  Mod effect on daily activities    Multiple Pain  Sites  No         [facility] OT Assessment - 12/20/17 1306      Assessment   Medical Diagnosis  right shoulder pain secondary to right proximal humerus fracture      Precautions   Precautions  Shoulder    Type of Shoulder Precautions  Progress as tolerated.                OT Treatments/Exercises (OP) - 12/20/17 1308      Exercises   Exercises  Shoulder      Shoulder Exercises: Supine   Protraction  AAROM;15 reps    Horizontal ABduction  AAROM;15 reps    External Rotation  AAROM;15 reps    Internal Rotation  AAROM;15 reps    Flexion  AAROM;15 reps    ABduction  AAROM;15 reps      Shoulder Exercises: Therapy Ball   Flexion  Right;10 reps    ABduction  Right;10 reps               OT Short Term Goals - 12/20/17 1616      OT SHORT TERM GOAL #1   Title  Patient will be educated and independent with HEP to faciliate progress in therapy and allow him to return to using his RUE as his dominant extremity for all daily tasks.    Time  6    Period  Weeks    Status  On-going      OT SHORT TERM GOAL #2   Title  Patient will increase RUE strength to 3+/5 in order to complete daily and leisure tasks.    Time  6    Period  Weeks      OT SHORT TERM GOAL #3   Title   Patient will increase RUE P/ROM to [facility] to increase ability to complete ADLs and be able to go fishing.    Time  6    Period  Weeks      OT SHORT TERM GOAL  #4   Title   Patient will decrease pain level in RUE during daily tasks to 5/10 or less.      Time  6    Period  Weeks      OT SHORT TERM GOAL #5   Title  Patient will decrease fascial restrictions in RUE to min amount or less in order to increase functional mobility needed to complete functional reaching tasks.      Time  6    Period  Weeks    Status  On-going        OT Long Term Goals - 12/20/17 1617      OT LONG TERM GOAL #1   Title  Patient will return to highest level of independence with all daily and leisure tasks while using her RUE as his dominant.      Time  12    Period  Weeks    Status  On-going      OT LONG TERM GOAL #2   Title  Patient will increase his RUE strength to 4+/5 in order to return to be able to complete all daily routine tasks.    Baseline  At reassessment today, 12/11/17, pt demonstrates strength as 4/5.     Time  12    Period  Weeks    Status  On-going      OT LONG TERM GOAL #3   Title   Patient will increase RUE A/ROM to [facility] to increase ability to  complete functional reaching tasks above head.     Time  12    Period  Weeks      OT LONG TERM GOAL #4   Title  Patient will report a decrease in pain level for RUE while completing daily tasks and fishing to 3/10 or less.      Time  12    Period  Weeks    Status  On-going      OT LONG TERM GOAL #5   Title  Patient will decrease fascial restrictions to trace amount or less in RUE in order to increase functional mobility needed to complete tasks.      Time  12    Period  Weeks    Status  On-going            Plan - 12/20/17 1344    Clinical Impression Statement  A: Pt was not able to complete session today and he decided to leave session early due to reported pain, fatigue, dizziness, and nausea. Pt reported vestibular dysfunction and OT educated pt on vestibular therapy options available at this clinic. OT then provided pt with water and requested that he rest before leaving clinic for safety.  OT confirmed that pt's symptoms had subsided before releasing him to drive home.     Plan  P: Follow up with pt on vestibular dysfunction and associated symptoms. Pt will resume exercises to improve strength within ROM and activity tolerance limitations, as tolerated. Resume theraband exercises and introduce functional reaching activities.      Consulted and Agree with Plan of Care  Patient       Patient will benefit from skilled therapeutic intervention in order to improve the following deficits and impairments:  Decreased range of motion, Decreased endurance, Decreased activity tolerance, Increased fascial restrictions, Impaired UE functional use, Pain, Decreased mobility, Decreased strength  Visit Diagnosis: Acute pain of right shoulder  Other symptoms and signs involving the musculoskeletal system  Stiffness of right shoulder, not elsewhere classified    Problem List Patient Active Problem List   Diagnosis Date Noted  . Dysphagia 07/10/2016  . Diarrhea 07/10/2016  . BMI 40.0-44.9, adult ([place]) 05/31/2015  . Neuropathy 03/17/2015  . Prostate cancer ([place]) 06/30/2013  . Abdominal aneurysm without mention of rupture 02/19/2013  . Need for prophylactic vaccination and inoculation against influenza 02/03/2013  . ANXIETY 01/14/2013  . OBESITY NOS 01/14/2013  . VITAMIN B12 DEFICIENCY 01/14/2013  . TOBACCO ABUSE 01/14/2013  . PTSD 01/14/2013  . COPD (chronic obstructive pulmonary disease) ([place])   . OSA (obstructive sleep apnea) 08/14/2012  . RLS (restless legs syndrome) 08/14/2012  . Arthritis of both knees 08/14/2012  . Prediabetes 08/14/2012  . Nephrolithiasis 08/14/2012  . Aortic atherosclerosis ([place]) 02/19/2012  . Coronary atherosclerosis of native coronary artery 03/23/2010  . CHRONIC KIDNEY DISEASE STAGE II ([facility]) 10/13/2009  . GERD 07/30/2008  . BENIGN PROSTATIC HYPERTROPHY, WITH OBSTRUCTION 02/20/2008  . Hyperlipidemia 01/31/2007  . GOUT 01/31/2007  . Essential  hypertension, benign 01/31/2007    Toney Rakes, OT Student  12/20/2017, 4:17 PM  [place], [place], [address] Phone: [phone]   Fax:  [phone]  Name: [name] MRN: [id number] Date of Birth: 1946-03-27

## 2017-12-24 DIAGNOSIS — C61 Malignant neoplasm of prostate: Secondary | ICD-10-CM | POA: Diagnosis not present

## 2017-12-25 ENCOUNTER — Ambulatory Visit (HOSPITAL_COMMUNITY): Payer: Medicare Other | Admitting: Occupational Therapy

## 2017-12-26 DIAGNOSIS — M5136 Other intervertebral disc degeneration, lumbar region: Secondary | ICD-10-CM | POA: Diagnosis not present

## 2017-12-26 DIAGNOSIS — M545 Low back pain: Secondary | ICD-10-CM | POA: Diagnosis not present

## 2017-12-27 ENCOUNTER — Other Ambulatory Visit: Payer: Self-pay | Admitting: Family

## 2017-12-27 ENCOUNTER — Telehealth (HOSPITAL_COMMUNITY): Payer: Self-pay | Admitting: Family Medicine

## 2017-12-27 ENCOUNTER — Telehealth: Payer: Self-pay | Admitting: Family Medicine

## 2017-12-27 ENCOUNTER — Ambulatory Visit (HOSPITAL_COMMUNITY): Payer: Medicare Other

## 2017-12-27 DIAGNOSIS — G8929 Other chronic pain: Secondary | ICD-10-CM

## 2017-12-27 DIAGNOSIS — M549 Dorsalgia, unspecified: Principal | ICD-10-CM

## 2017-12-27 NOTE — Telephone Encounter (Signed)
12/27/17  wife called to cx said he was in a lot of pain today

## 2017-12-27 NOTE — Telephone Encounter (Signed)
Seen 9/3 for back pain

## 2017-12-27 NOTE — Progress Notes (Signed)
Referral placed.

## 2017-12-28 ENCOUNTER — Other Ambulatory Visit: Payer: Self-pay | Admitting: Urology

## 2017-12-28 ENCOUNTER — Ambulatory Visit (INDEPENDENT_AMBULATORY_CARE_PROVIDER_SITE_OTHER): Payer: Medicare Other | Admitting: Urology

## 2017-12-28 DIAGNOSIS — R351 Nocturia: Secondary | ICD-10-CM | POA: Diagnosis not present

## 2017-12-28 DIAGNOSIS — R9721 Rising PSA following treatment for malignant neoplasm of prostate: Secondary | ICD-10-CM

## 2017-12-28 DIAGNOSIS — C61 Malignant neoplasm of prostate: Secondary | ICD-10-CM

## 2018-01-01 ENCOUNTER — Ambulatory Visit (HOSPITAL_COMMUNITY): Payer: Medicare Other

## 2018-01-01 ENCOUNTER — Telehealth (HOSPITAL_COMMUNITY): Payer: Self-pay | Admitting: Family Medicine

## 2018-01-01 NOTE — Telephone Encounter (Signed)
01/01/18  cx via phone tree

## 2018-01-02 ENCOUNTER — Telehealth (HOSPITAL_COMMUNITY): Payer: Self-pay | Admitting: Family Medicine

## 2018-01-02 DIAGNOSIS — M25569 Pain in unspecified knee: Secondary | ICD-10-CM | POA: Diagnosis not present

## 2018-01-02 DIAGNOSIS — M170 Bilateral primary osteoarthritis of knee: Secondary | ICD-10-CM | POA: Diagnosis not present

## 2018-01-02 DIAGNOSIS — M1712 Unilateral primary osteoarthritis, left knee: Secondary | ICD-10-CM | POA: Diagnosis not present

## 2018-01-02 NOTE — Telephone Encounter (Signed)
01/02/18  pt called and said he wanted to suspend therapy for a bit... he is trying to get a shot in his back and he will call back when he is ready to begin again... he said he didn't want to be discahrged

## 2018-01-03 ENCOUNTER — Encounter (HOSPITAL_COMMUNITY): Payer: Self-pay

## 2018-01-08 ENCOUNTER — Ambulatory Visit (INDEPENDENT_AMBULATORY_CARE_PROVIDER_SITE_OTHER): Payer: Medicare Other | Admitting: *Deleted

## 2018-01-08 ENCOUNTER — Encounter (HOSPITAL_COMMUNITY): Payer: Self-pay

## 2018-01-08 DIAGNOSIS — E538 Deficiency of other specified B group vitamins: Secondary | ICD-10-CM

## 2018-01-10 ENCOUNTER — Ambulatory Visit (HOSPITAL_COMMUNITY): Payer: Medicare Other

## 2018-01-10 ENCOUNTER — Encounter (HOSPITAL_COMMUNITY): Payer: Self-pay

## 2018-01-14 DIAGNOSIS — R42 Dizziness and giddiness: Secondary | ICD-10-CM | POA: Diagnosis not present

## 2018-01-14 DIAGNOSIS — H903 Sensorineural hearing loss, bilateral: Secondary | ICD-10-CM | POA: Diagnosis not present

## 2018-01-14 DIAGNOSIS — Z6841 Body Mass Index (BMI) 40.0 and over, adult: Secondary | ICD-10-CM | POA: Diagnosis not present

## 2018-01-15 DIAGNOSIS — M5136 Other intervertebral disc degeneration, lumbar region: Secondary | ICD-10-CM | POA: Diagnosis not present

## 2018-01-16 ENCOUNTER — Ambulatory Visit (HOSPITAL_COMMUNITY)
Admission: RE | Admit: 2018-01-16 | Discharge: 2018-01-16 | Disposition: A | Discharge status: Home / Self Care | Payer: Medicare Other | Source: Ambulatory Visit | Attending: Cardiology | Admitting: Cardiology

## 2018-01-16 DIAGNOSIS — Z720 Tobacco use: Secondary | ICD-10-CM | POA: Diagnosis not present

## 2018-01-16 DIAGNOSIS — I2510 Atherosclerotic heart disease of native coronary artery without angina pectoris: Secondary | ICD-10-CM | POA: Diagnosis not present

## 2018-01-16 DIAGNOSIS — I10 Essential (primary) hypertension: Secondary | ICD-10-CM | POA: Insufficient documentation

## 2018-01-16 DIAGNOSIS — E785 Hyperlipidemia, unspecified: Secondary | ICD-10-CM | POA: Insufficient documentation

## 2018-01-16 DIAGNOSIS — F4310 Post-traumatic stress disorder, unspecified: Secondary | ICD-10-CM | POA: Insufficient documentation

## 2018-01-16 DIAGNOSIS — K219 Gastro-esophageal reflux disease without esophagitis: Secondary | ICD-10-CM | POA: Insufficient documentation

## 2018-01-16 DIAGNOSIS — M109 Gout, unspecified: Secondary | ICD-10-CM | POA: Diagnosis not present

## 2018-01-16 NOTE — Progress Notes (Signed)
*  PRELIMINARY RESULTS* Echocardiogram 2D Echocardiogram has been performed.  [name] 01/16/2018, 1:09 PM

## 2018-02-07 ENCOUNTER — Other Ambulatory Visit: Payer: Self-pay | Admitting: Cardiology

## 2018-02-12 ENCOUNTER — Ambulatory Visit (INDEPENDENT_AMBULATORY_CARE_PROVIDER_SITE_OTHER): Payer: Medicare Other | Admitting: *Deleted

## 2018-02-12 DIAGNOSIS — E538 Deficiency of other specified B group vitamins: Secondary | ICD-10-CM

## 2018-02-12 NOTE — Progress Notes (Signed)
Pt given cyanocobalamin inj Tolerated well 

## 2018-02-13 DIAGNOSIS — M25611 Stiffness of right shoulder, not elsewhere classified: Secondary | ICD-10-CM | POA: Diagnosis not present

## 2018-02-13 DIAGNOSIS — R296 Repeated falls: Secondary | ICD-10-CM | POA: Diagnosis not present

## 2018-02-13 DIAGNOSIS — H8112 Benign paroxysmal vertigo, left ear: Secondary | ICD-10-CM | POA: Diagnosis not present

## 2018-02-13 DIAGNOSIS — M25511 Pain in right shoulder: Secondary | ICD-10-CM | POA: Diagnosis not present

## 2018-02-14 ENCOUNTER — Encounter (HOSPITAL_COMMUNITY): Payer: Self-pay

## 2018-02-14 ENCOUNTER — Other Ambulatory Visit: Payer: Self-pay | Admitting: Family Medicine

## 2018-02-14 DIAGNOSIS — G6289 Other specified polyneuropathies: Secondary | ICD-10-CM

## 2018-02-14 NOTE — Therapy (Signed)
[place], [place], [address] Phone: [phone]   Fax:  [phone]  Patient Details  Name: [name] MRN: [id number] Date of Birth: 09-17-1945 Referring Provider:  No ref. provider found  Encounter Date: 02/14/2018   OCCUPATIONAL THERAPY DISCHARGE SUMMARY  Visits from Start of Care: 14  Current functional level related to goals / functional outcomes: Pt reports that he cannot perform tasks, such as putting away dishes at home due to strength limitations. Continued shoulder strengthening and stabilization will help to improve his functional use of right upper extremity, improving independence. Patient called on 01/02/18 stating that he was having a shot in his back and he'd like to hold therapy at this time. Although, as patient has not returned to clinic since his last treatment which was on  12/20/17 he will need to be discharged. If patient wishes to return he will need a new MD referral.  Remaining deficits: See above   Education / Equipment: Shoulder HEP Plan:                                                    Patient goals were not met. Patient is being discharged due to not returning since the last visit.  ?????          Ailene Ravel, OTR/L,CBIS  [phone]  02/14/2018, 4:12 PM  [place] [address] [place], [place], [address] Phone: [phone]   Fax:  [phone]

## 2018-02-15 ENCOUNTER — Other Ambulatory Visit: Payer: Self-pay

## 2018-02-18 DIAGNOSIS — R296 Repeated falls: Secondary | ICD-10-CM | POA: Diagnosis not present

## 2018-02-18 DIAGNOSIS — M25511 Pain in right shoulder: Secondary | ICD-10-CM | POA: Diagnosis not present

## 2018-02-18 DIAGNOSIS — M25611 Stiffness of right shoulder, not elsewhere classified: Secondary | ICD-10-CM | POA: Diagnosis not present

## 2018-02-18 DIAGNOSIS — H8112 Benign paroxysmal vertigo, left ear: Secondary | ICD-10-CM | POA: Diagnosis not present

## 2018-02-19 ENCOUNTER — Other Ambulatory Visit: Payer: Self-pay | Admitting: Family Medicine

## 2018-02-19 DIAGNOSIS — M25611 Stiffness of right shoulder, not elsewhere classified: Secondary | ICD-10-CM | POA: Diagnosis not present

## 2018-02-19 DIAGNOSIS — H8112 Benign paroxysmal vertigo, left ear: Secondary | ICD-10-CM | POA: Diagnosis not present

## 2018-02-19 DIAGNOSIS — M25511 Pain in right shoulder: Secondary | ICD-10-CM | POA: Diagnosis not present

## 2018-02-19 DIAGNOSIS — R296 Repeated falls: Secondary | ICD-10-CM | POA: Diagnosis not present

## 2018-02-19 NOTE — Telephone Encounter (Signed)
Aware refill sent to pharmacy ?

## 2018-02-19 NOTE — Telephone Encounter (Signed)
What is the name of the medication? Duloxetine 30 mg and Pantoprazole 40 mg Patient is out and has appt 11-25 @ 8:55 with [name] for Med refills  Have you contacted your pharmacy to request a refill? Yes  Which pharmacy would you like this sent to? [facility]   Patient notified that their request is being sent to the clinical staff for review and that they should receive a call once it is complete. If they do not receive a call within 24 hours they can check with their pharmacy or our office.

## 2018-02-21 ENCOUNTER — Other Ambulatory Visit: Payer: Self-pay | Admitting: Family Medicine

## 2018-02-21 DIAGNOSIS — M25511 Pain in right shoulder: Secondary | ICD-10-CM | POA: Diagnosis not present

## 2018-02-21 DIAGNOSIS — M25611 Stiffness of right shoulder, not elsewhere classified: Secondary | ICD-10-CM | POA: Diagnosis not present

## 2018-02-21 DIAGNOSIS — R296 Repeated falls: Secondary | ICD-10-CM | POA: Diagnosis not present

## 2018-02-21 DIAGNOSIS — H8112 Benign paroxysmal vertigo, left ear: Secondary | ICD-10-CM | POA: Diagnosis not present

## 2018-02-25 ENCOUNTER — Ambulatory Visit: Payer: Medicare Other | Admitting: Family Medicine

## 2018-02-25 DIAGNOSIS — S42291A Other displaced fracture of upper end of right humerus, initial encounter for closed fracture: Secondary | ICD-10-CM | POA: Diagnosis not present

## 2018-02-26 DIAGNOSIS — M25511 Pain in right shoulder: Secondary | ICD-10-CM | POA: Diagnosis not present

## 2018-02-26 DIAGNOSIS — R296 Repeated falls: Secondary | ICD-10-CM | POA: Diagnosis not present

## 2018-02-26 DIAGNOSIS — H8112 Benign paroxysmal vertigo, left ear: Secondary | ICD-10-CM | POA: Diagnosis not present

## 2018-02-26 DIAGNOSIS — M25611 Stiffness of right shoulder, not elsewhere classified: Secondary | ICD-10-CM | POA: Diagnosis not present

## 2018-03-04 DIAGNOSIS — M25611 Stiffness of right shoulder, not elsewhere classified: Secondary | ICD-10-CM | POA: Diagnosis not present

## 2018-03-04 DIAGNOSIS — M25511 Pain in right shoulder: Secondary | ICD-10-CM | POA: Diagnosis not present

## 2018-03-04 DIAGNOSIS — R296 Repeated falls: Secondary | ICD-10-CM | POA: Diagnosis not present

## 2018-03-04 DIAGNOSIS — H8112 Benign paroxysmal vertigo, left ear: Secondary | ICD-10-CM | POA: Diagnosis not present

## 2018-03-05 ENCOUNTER — Ambulatory Visit (HOSPITAL_COMMUNITY): Admission: RE | Admit: 2018-03-05 | Payer: Medicare Other | Source: Ambulatory Visit

## 2018-03-05 ENCOUNTER — Encounter (HOSPITAL_COMMUNITY): Payer: Self-pay

## 2018-03-05 ENCOUNTER — Encounter: Payer: Self-pay | Admitting: Family Medicine

## 2018-03-05 DIAGNOSIS — M25611 Stiffness of right shoulder, not elsewhere classified: Secondary | ICD-10-CM | POA: Diagnosis not present

## 2018-03-05 DIAGNOSIS — R296 Repeated falls: Secondary | ICD-10-CM | POA: Diagnosis not present

## 2018-03-05 DIAGNOSIS — H8112 Benign paroxysmal vertigo, left ear: Secondary | ICD-10-CM | POA: Diagnosis not present

## 2018-03-05 DIAGNOSIS — M25511 Pain in right shoulder: Secondary | ICD-10-CM | POA: Diagnosis not present

## 2018-03-14 ENCOUNTER — Ambulatory Visit: Payer: Medicare Other

## 2018-03-21 ENCOUNTER — Telehealth: Payer: Self-pay | Admitting: Family Medicine

## 2018-03-21 ENCOUNTER — Other Ambulatory Visit: Payer: Self-pay | Admitting: Family Medicine

## 2018-03-21 DIAGNOSIS — G6289 Other specified polyneuropathies: Secondary | ICD-10-CM

## 2018-03-21 DIAGNOSIS — I10 Essential (primary) hypertension: Secondary | ICD-10-CM

## 2018-03-21 MED ORDER — DULOXETINE HCL 30 MG PO CPEP: ORAL_CAPSULE | ORAL | 0 refills | Status: DC

## 2018-03-21 MED ORDER — AMLODIPINE BESYLATE 10 MG PO TABS: 10.0000 mg | ORAL_TABLET | Freq: Every day | ORAL | 0 refills | Status: DC

## 2018-03-21 NOTE — Telephone Encounter (Signed)
Pt states he is going out of town and needs refills on cymbalta and amliodipine sent to [facility], states that [facility] was suppose to fax [place] the other day.

## 2018-03-21 NOTE — Telephone Encounter (Signed)
Pt aware 30 days sent in pharmacy Will call back when he returns to town to make appt

## 2018-04-01 DIAGNOSIS — C61 Malignant neoplasm of prostate: Secondary | ICD-10-CM | POA: Diagnosis not present

## 2018-04-05 ENCOUNTER — Ambulatory Visit (INDEPENDENT_AMBULATORY_CARE_PROVIDER_SITE_OTHER): Payer: Medicare Other | Admitting: Urology

## 2018-04-05 DIAGNOSIS — C61 Malignant neoplasm of prostate: Secondary | ICD-10-CM

## 2018-04-05 DIAGNOSIS — R9721 Rising PSA following treatment for malignant neoplasm of prostate: Secondary | ICD-10-CM | POA: Diagnosis not present

## 2018-04-05 DIAGNOSIS — N3281 Overactive bladder: Secondary | ICD-10-CM

## 2018-04-05 DIAGNOSIS — R351 Nocturia: Secondary | ICD-10-CM

## 2018-04-08 ENCOUNTER — Ambulatory Visit (INDEPENDENT_AMBULATORY_CARE_PROVIDER_SITE_OTHER): Payer: Medicare Other | Admitting: Family Medicine

## 2018-04-08 ENCOUNTER — Encounter: Payer: Self-pay | Admitting: Family Medicine

## 2018-04-08 VITALS — BP 154/84 | HR 66 | Temp 96.8°F | Ht 68.0 in | Wt 284.0 lb

## 2018-04-08 DIAGNOSIS — I10 Essential (primary) hypertension: Secondary | ICD-10-CM

## 2018-04-08 DIAGNOSIS — G6289 Other specified polyneuropathies: Secondary | ICD-10-CM | POA: Diagnosis not present

## 2018-04-08 DIAGNOSIS — N182 Chronic kidney disease, stage 2 (mild): Secondary | ICD-10-CM

## 2018-04-08 DIAGNOSIS — R7303 Prediabetes: Secondary | ICD-10-CM | POA: Diagnosis not present

## 2018-04-08 DIAGNOSIS — F339 Major depressive disorder, recurrent, unspecified: Secondary | ICD-10-CM

## 2018-04-08 DIAGNOSIS — E782 Mixed hyperlipidemia: Secondary | ICD-10-CM | POA: Diagnosis not present

## 2018-04-08 LAB — BAYER DCA HB A1C WAIVED: HB A1C (BAYER DCA - WAIVED): 5.8 % (ref <7.0)

## 2018-04-08 MED ORDER — AMLODIPINE BESYLATE 10 MG PO TABS: 10.0000 mg | ORAL_TABLET | Freq: Every day | ORAL | 1 refills | Status: DC

## 2018-04-08 MED ORDER — LOSARTAN POTASSIUM 50 MG PO TABS: 50.0000 mg | ORAL_TABLET | Freq: Every day | ORAL | 1 refills | Status: DC

## 2018-04-08 MED ORDER — CARVEDILOL 25 MG PO TABS: 25.0000 mg | ORAL_TABLET | Freq: Two times a day (BID) | ORAL | 1 refills | Status: DC

## 2018-04-08 MED ORDER — ALLOPURINOL 300 MG PO TABS: 300.0000 mg | ORAL_TABLET | Freq: Every day | ORAL | 1 refills | Status: DC

## 2018-04-08 MED ORDER — PANTOPRAZOLE SODIUM 40 MG PO TBEC: 40.0000 mg | DELAYED_RELEASE_TABLET | Freq: Two times a day (BID) | ORAL | 1 refills | Status: DC

## 2018-04-08 MED ORDER — DULOXETINE HCL 60 MG PO CPEP: ORAL_CAPSULE | ORAL | 1 refills | Status: DC

## 2018-04-08 NOTE — Progress Notes (Signed)
BP (!) 154/84   Pulse 66   Temp (!) 96.8 F (36 C) (Oral)   Ht 5' 8"  (1.727 m)   Wt 284 lb (128.8 kg)   BMI 43.18 kg/m    Subjective:    Patient ID: [name], male    DOB: 12-06-1945, 73 y.o.   MRN: [id number]  HPI: [name] is a 73 y.o. male presenting on 04/08/2018 for Depression (check up of chronic medical conditions) and Hypertension   HPI Depression and anxiety Patient is coming in to discuss depression anxiety he says it has been building up and he has been having some more problems with it.  He has been on Cymbalta 30 or like to try increasing to see if he can help with this.  He denies any suicidal ideations or thoughts of hurting herself.  He says a lot of this is related to pains and some his chronic illnesses that he is been fighting and specifically cellulitis orthopedic injuries more recently. Depression screen [facility] 2/9 04/08/2018 12/04/2017 10/18/2017 09/26/2017 09/12/2017  Decreased Interest 2 1 0 0 0  Down, Depressed, Hopeless 2 1 0 0 0  PHQ - 2 Score 4 2 0 0 0  Altered sleeping 1 0 - - -  Tired, decreased energy 2 1 - - -  Change in appetite 0 0 - - -  Feeling bad or failure about yourself  1 1 - - -  Trouble concentrating 0 0 - - -  Moving slowly or fidgety/restless 0 0 - - -  Suicidal thoughts 1 1 - - -  PHQ-9 Score 9 5 - - -  Difficult doing work/chores - - - - -  Some recent data might be hidden    Hypertension Patient is currently on carvedilol and amlodipine and losartan, and their blood pressure today is 54/84. Patient denies any lightheadedness or dizziness. Patient denies headaches, blurred vision, chest pains, shortness of breath, or weakness. Denies any side effects from medication and is content with current medication.   PreDiabetes Patient comes in today for recheck of his diabetes. Patient has been currently taking no medication currently is diet controlled and we are monitoring. Patient is currently on an ACE inhibitor/ARB. Patient  has not seen an ophthalmologist this year. Patient denies any issues with their feet.  She does have peripheral neuropathy but he says is unchanged.  Hyperlipidemia Patient is coming in for recheck of his hyperlipidemia. The patient is currently taking no medication currently because his been intolerant of statins previously, we are monitoring for now. They deny any issues with myalgias or history of liver damage from it. They deny any focal numbness or weakness or chest pain.    Relevant past medical, surgical, family and social history reviewed and updated as indicated. Interim medical history since our last visit reviewed. Allergies and medications reviewed and updated.  Review of Systems  Constitutional: Negative for chills and fever.  Eyes: Negative for discharge.  Respiratory: Negative for shortness of breath and wheezing.   Cardiovascular: Negative for chest pain and leg swelling.  Musculoskeletal: Negative for back pain and gait problem.  Skin: Negative for rash.  Neurological: Positive for numbness. Negative for weakness.  Psychiatric/Behavioral: Positive for decreased concentration and dysphoric mood. Negative for self-injury, sleep disturbance and suicidal ideas. The patient is nervous/anxious.   All other systems reviewed and are negative.   Per HPI unless specifically indicated above   Allergies as of 04/08/2018      Reactions  Carbidopa-levodopa Other (See Comments)   hallunications   Morphine Sulfate Nausea And Vomiting   Sulfacetamide Sodium Nausea And Vomiting      Medication List       Accurate as of April 08, 2018 10:58 AM. Always use your most recent med list.        allopurinol 300 MG tablet Commonly known as:  ZYLOPRIM Take 1 tablet (300 mg total) by mouth daily.   amLODipine 10 MG tablet Commonly known as:  NORVASC Take 1 tablet (10 mg total) by mouth daily. (Needs to be seen before next refill)   carvedilol 25 MG tablet Commonly known as:   COREG Take 1 tablet (25 mg total) by mouth 2 (two) times daily.   DULoxetine 60 MG capsule Commonly known as:  CYMBALTA TAKE 1 CAPSULE BY MOUTH ONCE DAILY IN THE EVENING   losartan 50 MG tablet Commonly known as:  COZAAR Take 1 tablet (50 mg total) by mouth daily.   pantoprazole 40 MG tablet Commonly known as:  PROTONIX Take 1 tablet (40 mg total) by mouth 2 (two) times daily.          Objective:    BP (!) 154/84   Pulse 66   Temp (!) 96.8 F (36 C) (Oral)   Ht '5\' 8"'$  (1.727 m)   Wt 284 lb (128.8 kg)   BMI 43.18 kg/m   Wt Readings from Last 3 Encounters:  04/08/18 284 lb (128.8 kg)  12/04/17 280 lb 12.8 oz (127.4 kg)  10/18/17 272 lb (123.4 kg)    Physical Exam Vitals signs and nursing note reviewed.  Constitutional:      General: He is not in acute distress.    Appearance: He is well-developed. He is not diaphoretic.  Eyes:     General: No scleral icterus.    Conjunctiva/sclera: Conjunctivae normal.  Neck:     Musculoskeletal: Neck supple.     Thyroid: No thyromegaly.  Cardiovascular:     Rate and Rhythm: Normal rate and regular rhythm.     Heart sounds: Normal heart sounds. No murmur.  Pulmonary:     Effort: Pulmonary effort is normal. No respiratory distress.     Breath sounds: Normal breath sounds. No wheezing.  Lymphadenopathy:     Cervical: No cervical adenopathy.  Skin:    General: Skin is warm and dry.     Findings: No rash.  Neurological:     Mental Status: He is alert and oriented to person, place, and time.     Coordination: Coordination normal.  Psychiatric:        Behavior: Behavior normal.         Assessment & Plan:   Problem List Items Addressed This Visit      Cardiovascular and Mediastinum   Essential hypertension, benign   Relevant Medications   amLODipine (NORVASC) 10 MG tablet   carvedilol (COREG) 25 MG tablet   losartan (COZAAR) 50 MG tablet     Genitourinary   CHRONIC KIDNEY DISEASE STAGE II ([facility])   Relevant Orders    CBC with Differential/Platelet (Completed)     Other   Hyperlipidemia   Relevant Medications   amLODipine (NORVASC) 10 MG tablet   carvedilol (COREG) 25 MG tablet   losartan (COZAAR) 50 MG tablet   Prediabetes - Primary   Relevant Orders   Bayer DCA Hb A1c Waived (Completed)   CMP14+EGFR (Completed)   Morbid obesity (HCC)   Depression, recurrent (HCC)   Relevant Medications   DULoxetine (  CYMBALTA) 60 MG capsule    Other Visit Diagnoses    Other polyneuropathy       Relevant Medications   DULoxetine (CYMBALTA) 60 MG capsule      We will start patient on Cymbalta 60 and continue other medication currently. Follow up plan: Return in about 3 months (around 07/08/2018), or if symptoms worsen or fail to improve, for Follow-up hypertension and prediabetes.  Counseling provided for all of the vaccine components Orders Placed This Encounter  Procedures  . Bayer DCA Hb A1c Waived  . CMP14+EGFR  . CBC with Differential/Platelet    Caryl Pina, MD [place] 04/08/2018, 10:58 AM

## 2018-04-09 ENCOUNTER — Ambulatory Visit (HOSPITAL_COMMUNITY)
Admission: RE | Admit: 2018-04-09 | Discharge: 2018-04-09 | Disposition: A | Discharge status: Home / Self Care | Payer: Medicare Other | Source: Ambulatory Visit | Attending: Urology | Admitting: Urology

## 2018-04-09 DIAGNOSIS — R9721 Rising PSA following treatment for malignant neoplasm of prostate: Secondary | ICD-10-CM | POA: Insufficient documentation

## 2018-04-09 DIAGNOSIS — C61 Malignant neoplasm of prostate: Secondary | ICD-10-CM | POA: Diagnosis not present

## 2018-04-09 LAB — CMP14+EGFR
ALT: 11 IU/L (ref 0–44)
AST: 14 IU/L (ref 0–40)
Albumin/Globulin Ratio: 1.6 (ref 1.2–2.2)
Albumin: 3.9 g/dL (ref 3.5–4.8)
Alkaline Phosphatase: 86 IU/L (ref 39–117)
BUN/Creatinine Ratio: 9 — ABNORMAL LOW (ref 10–24)
BUN: 18 mg/dL (ref 8–27)
Bilirubin Total: 0.3 mg/dL (ref 0.0–1.2)
CO2: 22 mmol/L (ref 20–29)
Calcium: 8.6 mg/dL (ref 8.6–10.2)
Chloride: 104 mmol/L (ref 96–106)
Creatinine, Ser: 1.91 mg/dL — ABNORMAL HIGH (ref 0.76–1.27)
GFR calc Af Amer: 39 mL/min/{1.73_m2} — ABNORMAL LOW (ref >59)
GFR calc non Af Amer: 34 mL/min/{1.73_m2} — ABNORMAL LOW (ref >59)
Globulin, Total: 2.5 g/dL (ref 1.5–4.5)
Glucose: 98 mg/dL (ref 65–99)
Potassium: 3.3 mmol/L — ABNORMAL LOW (ref 3.5–5.2)
Sodium: 143 mmol/L (ref 134–144)
Total Protein: 6.4 g/dL (ref 6.0–8.5)

## 2018-04-09 LAB — CBC WITH DIFFERENTIAL/PLATELET
Basophils Absolute: 0.1 10*3/uL (ref 0.0–0.2)
Basos: 1 %
EOS (ABSOLUTE): 0.3 10*3/uL (ref 0.0–0.4)
Eos: 5 %
Hematocrit: 39.2 % (ref 37.5–51.0)
Hemoglobin: 13.3 g/dL (ref 13.0–17.7)
Immature Grans (Abs): 0 10*3/uL (ref 0.0–0.1)
Immature Granulocytes: 0 %
Lymphocytes Absolute: 0.6 10*3/uL — ABNORMAL LOW (ref 0.7–3.1)
Lymphs: 9 %
MCH: 29.3 pg (ref 26.6–33.0)
MCHC: 33.9 g/dL (ref 31.5–35.7)
MCV: 86 fL (ref 79–97)
Monocytes Absolute: 0.4 10*3/uL (ref 0.1–0.9)
Monocytes: 6 %
Neutrophils Absolute: 5 10*3/uL (ref 1.4–7.0)
Neutrophils: 79 %
Platelets: 262 10*3/uL (ref 150–450)
RBC: 4.54 x10E6/uL (ref 4.14–5.80)
RDW: 14.4 % (ref 11.6–15.4)
WBC: 6.4 10*3/uL (ref 3.4–10.8)

## 2018-04-09 MED ORDER — AXUMIN (FLUCICLOVINE F 18) INJECTION
9.7000 | Freq: Once | INTRAVENOUS | Status: AC
  Administered 2018-04-09: 9.7 via INTRAVENOUS

## 2018-04-17 ENCOUNTER — Other Ambulatory Visit (HOSPITAL_COMMUNITY): Payer: Self-pay | Admitting: Urology

## 2018-04-17 ENCOUNTER — Other Ambulatory Visit: Payer: Self-pay | Admitting: Urology

## 2018-04-17 DIAGNOSIS — R911 Solitary pulmonary nodule: Secondary | ICD-10-CM

## 2018-04-18 ENCOUNTER — Other Ambulatory Visit (HOSPITAL_COMMUNITY): Payer: Self-pay | Admitting: Urology

## 2018-04-18 ENCOUNTER — Other Ambulatory Visit: Payer: Self-pay | Admitting: Urology

## 2018-04-18 DIAGNOSIS — R911 Solitary pulmonary nodule: Secondary | ICD-10-CM

## 2018-04-19 ENCOUNTER — Encounter (HOSPITAL_COMMUNITY): Payer: Self-pay

## 2018-04-19 ENCOUNTER — Ambulatory Visit (HOSPITAL_COMMUNITY)
Admission: RE | Admit: 2018-04-19 | Discharge: 2018-04-19 | Disposition: A | Discharge status: Home / Self Care | Payer: Medicare Other | Source: Ambulatory Visit | Attending: Urology | Admitting: Urology

## 2018-04-19 DIAGNOSIS — R911 Solitary pulmonary nodule: Secondary | ICD-10-CM | POA: Insufficient documentation

## 2018-04-19 DIAGNOSIS — R918 Other nonspecific abnormal finding of lung field: Secondary | ICD-10-CM | POA: Diagnosis not present

## 2018-04-19 MED ORDER — IOHEXOL 300 MG/ML  SOLN
60.0000 mL | Freq: Once | INTRAMUSCULAR | Status: AC | PRN
  Administered 2018-04-19: 60 mL via INTRAVENOUS

## 2018-04-26 ENCOUNTER — Ambulatory Visit (INDEPENDENT_AMBULATORY_CARE_PROVIDER_SITE_OTHER): Payer: Medicare Other | Admitting: Urology

## 2018-04-26 DIAGNOSIS — R911 Solitary pulmonary nodule: Secondary | ICD-10-CM | POA: Diagnosis not present

## 2018-04-26 DIAGNOSIS — R351 Nocturia: Secondary | ICD-10-CM | POA: Diagnosis not present

## 2018-04-26 DIAGNOSIS — C61 Malignant neoplasm of prostate: Secondary | ICD-10-CM

## 2018-04-26 DIAGNOSIS — R9721 Rising PSA following treatment for malignant neoplasm of prostate: Secondary | ICD-10-CM | POA: Diagnosis not present

## 2018-05-27 DIAGNOSIS — C61 Malignant neoplasm of prostate: Secondary | ICD-10-CM | POA: Diagnosis not present

## 2018-05-30 ENCOUNTER — Ambulatory Visit (INDEPENDENT_AMBULATORY_CARE_PROVIDER_SITE_OTHER): Payer: Medicare Other | Admitting: Family

## 2018-05-30 ENCOUNTER — Encounter: Payer: Self-pay | Admitting: Family

## 2018-05-30 VITALS — BP 132/79 | HR 66 | Temp 98.0°F | Ht 68.0 in | Wt 281.4 lb

## 2018-05-30 DIAGNOSIS — B370 Candidal stomatitis: Secondary | ICD-10-CM | POA: Diagnosis not present

## 2018-05-30 DIAGNOSIS — F17200 Nicotine dependence, unspecified, uncomplicated: Secondary | ICD-10-CM | POA: Diagnosis not present

## 2018-05-30 DIAGNOSIS — B372 Candidiasis of skin and nail: Secondary | ICD-10-CM

## 2018-05-30 MED ORDER — FLUCONAZOLE 150 MG PO TABS: 150.0000 mg | ORAL_TABLET | Freq: Every day | ORAL | 0 refills | Status: DC

## 2018-05-30 MED ORDER — NYSTATIN 100000 UNIT/GM EX POWD: Freq: Four times a day (QID) | CUTANEOUS | 0 refills | Status: DC

## 2018-05-30 MED ORDER — FIRST-DUKES MOUTHWASH MT SUSP: 15.0000 mL | Freq: Four times a day (QID) | OROMUCOSAL | 2 refills | Status: DC

## 2018-05-30 NOTE — Patient Instructions (Signed)

## 2018-05-30 NOTE — Progress Notes (Signed)
Subjective:    Patient ID: [name], male    DOB: 09/29/1945, 73 y.o.   MRN: [id number]  Chief Complaint  Patient presents with  . sore place in roof of mouth  . yeast at axillary and stomach    Mouth Lesions   The current episode started 5 to 7 days ago. The onset was gradual. The problem occurs continuously. The problem has been gradually worsening. The problem is moderate. Nothing relieves the symptoms. The symptoms are aggravated by eating and drinking (hot food). Associated symptoms include mouth sores and rash.  Rash  This is a new problem. The current episode started more than 1 month ago. The problem has been waxing and waning since onset. The affected locations include the abdomen, left axilla and right axilla. The rash is characterized by itchiness and redness. Associated with: steroids. Treatments tried: powder.      Review of Systems  HENT: Positive for mouth sores.   Skin: Positive for rash.  All other systems reviewed and are negative.      Objective:   Physical Exam Vitals signs reviewed.  Constitutional:      General: He is not in acute distress.    Appearance: He is well-developed. He is obese.  HENT:     Right Ear: Tympanic membrane normal.     Left Ear: Tympanic membrane normal.     Mouth/Throat:     Mouth: Oral lesions present.   Eyes:     General:        Right eye: No discharge.        Left eye: No discharge.     Pupils: Pupils are equal, round, and reactive to light.  Neck:     Musculoskeletal: Normal range of motion and neck supple.     Thyroid: No thyromegaly.  Cardiovascular:     Rate and Rhythm: Normal rate and regular rhythm.     Heart sounds: Normal heart sounds. No murmur.  Pulmonary:     Effort: Pulmonary effort is normal. No respiratory distress.     Breath sounds: Normal breath sounds. No wheezing.  Abdominal:     General: Bowel sounds are normal. There is no distension.     Palpations: Abdomen is soft.     Tenderness:  There is no abdominal tenderness.  Musculoskeletal: Normal range of motion.        General: No tenderness.  Skin:    General: Skin is warm and dry.     Findings: Erythema and rash present.       Neurological:     Mental Status: He is alert and oriented to person, place, and time.     Cranial Nerves: No cranial nerve deficit.     Deep Tendon Reflexes: Reflexes are normal and symmetric.  Psychiatric:        Behavior: Behavior normal.        Thought Content: Thought content normal.        Judgment: Judgment normal.       BP 132/79   Pulse 66   Temp 98 F (36.7 C) (Oral)   Ht 5\' 8"  (1.727 m)   Wt 281 lb 6.4 oz (127.6 kg)   BMI 42.79 kg/m      Assessment & Plan:  [name] comes in today with chief complaint of sore place in roof of mouth and yeast at axillary and stomach   Diagnosis and orders addressed:  1. Oral candidiasis Start Diflucan daily for 2 weeks Start  mouthwash - fluconazole (DIFLUCAN) 150 MG tablet; Take 1 tablet (150 mg total) by mouth daily.  Dispense: 14 tablet; Refill: 0 - Diphenhyd-Hydrocort-Nystatin (FIRST-DUKES MOUTHWASH) SUSP; Use as directed 15 mLs in the mouth or throat 4 (four) times daily.  Dispense: 450 mL; Refill: 2  2. Skin candidiasis Keep clean and dry - fluconazole (DIFLUCAN) 150 MG tablet; Take 1 tablet (150 mg total) by mouth daily.  Dispense: 14 tablet; Refill: 0 - nystatin (MYCOSTATIN/NYSTOP) powder; Apply topically 4 (four) times daily.  Dispense: 15 g; Refill: 0  3. TOBACCO ABUSE Smoking cessation discussed  4. Morbid obesity ([place])    Evelina Dun, FNP

## 2018-05-31 ENCOUNTER — Ambulatory Visit (INDEPENDENT_AMBULATORY_CARE_PROVIDER_SITE_OTHER): Payer: Medicare Other | Admitting: Urology

## 2018-05-31 DIAGNOSIS — R9721 Rising PSA following treatment for malignant neoplasm of prostate: Secondary | ICD-10-CM

## 2018-05-31 DIAGNOSIS — C61 Malignant neoplasm of prostate: Secondary | ICD-10-CM | POA: Diagnosis not present

## 2018-06-11 ENCOUNTER — Ambulatory Visit (INDEPENDENT_AMBULATORY_CARE_PROVIDER_SITE_OTHER): Payer: Medicare Other

## 2018-06-11 ENCOUNTER — Ambulatory Visit (INDEPENDENT_AMBULATORY_CARE_PROVIDER_SITE_OTHER): Payer: Medicare Other | Admitting: Physician Assistant

## 2018-06-11 ENCOUNTER — Encounter: Payer: Self-pay | Admitting: Physician Assistant

## 2018-06-11 VITALS — BP 148/97 | HR 72 | Temp 96.7°F | Ht 68.0 in | Wt 277.8 lb

## 2018-06-11 DIAGNOSIS — W19XXXA Unspecified fall, initial encounter: Secondary | ICD-10-CM

## 2018-06-11 DIAGNOSIS — S299XXA Unspecified injury of thorax, initial encounter: Secondary | ICD-10-CM | POA: Diagnosis not present

## 2018-06-11 DIAGNOSIS — S2231XA Fracture of one rib, right side, initial encounter for closed fracture: Secondary | ICD-10-CM

## 2018-06-11 DIAGNOSIS — R0781 Pleurodynia: Secondary | ICD-10-CM | POA: Diagnosis not present

## 2018-06-11 DIAGNOSIS — S20211A Contusion of right front wall of thorax, initial encounter: Secondary | ICD-10-CM

## 2018-06-11 MED ORDER — TRAMADOL HCL 50 MG PO TABS: 50.0000 mg | ORAL_TABLET | Freq: Four times a day (QID) | ORAL | 0 refills | Status: DC | PRN

## 2018-06-13 NOTE — Progress Notes (Signed)
BP (!) 148/97   Pulse 72   Temp (!) 96.7 F (35.9 C) (Oral)   Ht 5\' 8"  (1.727 m)   Wt 277 lb 12.8 oz (126 kg)   SpO2 91%   BMI 42.24 kg/m    Subjective:    Patient ID: [name], male    DOB: November 06, 1945, 73 y.o.   MRN: [id number]  HPI: [name] is a 73 y.o. male presenting on 06/11/2018 for Fall (hurt right side ribs)  He had fallen a few days ago and has had increasing pain in the right lateral ribs. Cough and deep breathing hurts more.  No strained breathing.    He has had multiple fractures over the past few years, may consider DEXA scan in the future.  Past Medical History:  Diagnosis Date  . Aortic atherosclerosis ([place]) followed by dr Trula Slade   2004  s/p  closure Penetrating atherosclerotic ulcer of infarenal aorta w/ endovascular stent graft  . Arthritis   . CKD (chronic kidney disease), stage III ([place])   . Closed fracture of head of humerus 07/2017   right shoulder from fall; 09-25-2017 per pt no surgical intervention, only wore sling, intermittant pain  . Coronary atherosclerosis of native coronary artery cardiologist-  dr Zigmund Gottron   positive myoview for ischemia 09-27-2009;  10-01-2009 per cardiac cath-- minimal nonobstructive CAD w/ 30% LAD   . ED (erectile dysfunction)   . Emphysema/COPD [facility])    followed by pcp--- last exacerbation 09-12-2017;  09-25-2017 per pt no cough, sob or congestion  . Essential hypertension, benign   . First degree heart block   . Frequency of urination   . GERD (gastroesophageal reflux disease)   . Gout    09-25-2017  per pt stable,  last episode 2015  . Heart murmur   . History of adenomatous polyp of colon   . History of closed head injury 2005   per pt residual resolved  . History of external beam radiation therapy    completed 10/ 2015 for prostate cancer  . History of gastric ulcer 2014  . History of kidney stones   . History of pneumothorax    spontaneous pneumo treated w/ chest tube  . History of rib  fracture 07/2017   from fall,  right side 6th,7th,8th  . Neuropathy   . OAB (overactive bladder)   . OSA (obstructive sleep apnea)    Intolerant of CPAP  . Prostate cancer [facility]) urologist-  dr wrenn/  oncologist-- dr Alen Blew and dr Tammi Klippel   dx 09-26-2011 via bx-- Stage T3b,N0,  Gleason 4+4, PSA 11.2 with METs to external iliac lymph node(resolved with ADT)-- treated w/ ADT ;   05/2013 staging work-up for rising PSA , started casodex and completed radiation therapy 10/ 2015;   rising PSA post treatment  . RLS (restless legs syndrome)   . Ureteral neocystostomy bleed    left side  . Urge urinary incontinence    Relevant past medical, surgical, family and social history reviewed and updated as indicated. Interim medical history since our last visit reviewed. Allergies and medications reviewed and updated. DATA REVIEWED: CHART IN EPIC  Family History reviewed for pertinent findings.  Review of Systems  Constitutional: Negative.  Negative for appetite change and fatigue.  Eyes: Negative for pain and visual disturbance.  Respiratory: Negative.  Negative for cough, chest tightness, shortness of breath and wheezing.   Cardiovascular: Negative.  Negative for chest pain, palpitations and leg swelling.  Gastrointestinal: Negative.  Negative  for abdominal pain, diarrhea, nausea and vomiting.  Genitourinary: Negative.   Musculoskeletal: Positive for arthralgias and myalgias.  Skin: Negative.  Negative for color change and rash.  Neurological: Negative.  Negative for weakness, numbness and headaches.  Psychiatric/Behavioral: Negative.     Allergies as of 06/11/2018      Reactions   Carbidopa-levodopa Other (See Comments)   hallunications   Morphine Sulfate Nausea And Vomiting   Sulfa Antibiotics Nausea And Vomiting   Sulfacetamide Sodium Nausea And Vomiting      Medication List       Accurate as of June 11, 2018 11:59 PM. Always use your most recent med list.        allopurinol 300  MG tablet Commonly known as:  ZYLOPRIM Take 1 tablet (300 mg total) by mouth daily.   amLODipine 10 MG tablet Commonly known as:  NORVASC Take 1 tablet (10 mg total) by mouth daily. (Needs to be seen before next refill)   carvedilol 25 MG tablet Commonly known as:  COREG Take 1 tablet (25 mg total) by mouth 2 (two) times daily.   DULoxetine 60 MG capsule Commonly known as:  CYMBALTA TAKE 1 CAPSULE BY MOUTH ONCE DAILY IN THE EVENING   First-Dukes Mouthwash Susp Use as directed 15 mLs in the mouth or throat 4 (four) times daily.   fluconazole 150 MG tablet Commonly known as:  DIFLUCAN Take 1 tablet (150 mg total) by mouth daily.   losartan 50 MG tablet Commonly known as:  COZAAR Take 1 tablet (50 mg total) by mouth daily.   nystatin powder Commonly known as:  MYCOSTATIN/NYSTOP Apply topically 4 (four) times daily.   oxybutynin 10 MG 24 hr tablet Commonly known as:  DITROPAN-XL Take 10 mg by mouth daily.   pantoprazole 40 MG tablet Commonly known as:  PROTONIX Take 1 tablet (40 mg total) by mouth 2 (two) times daily.   traMADol 50 MG tablet Commonly known as:  ULTRAM Take 1 tablet (50 mg total) by mouth every 6 (six) hours as needed.          Objective:    BP (!) 148/97   Pulse 72   Temp (!) 96.7 F (35.9 C) (Oral)   Ht 5\' 8"  (1.727 m)   Wt 277 lb 12.8 oz (126 kg)   SpO2 91%   BMI 42.24 kg/m   Allergies  Allergen Reactions  . Carbidopa-Levodopa Other (See Comments)    hallunications  . Morphine Sulfate Nausea And Vomiting  . Sulfa Antibiotics Nausea And Vomiting  . Sulfacetamide Sodium Nausea And Vomiting    Wt Readings from Last 3 Encounters:  06/11/18 277 lb 12.8 oz (126 kg)  05/30/18 281 lb 6.4 oz (127.6 kg)  04/08/18 284 lb (128.8 kg)    Physical Exam Vitals signs and nursing note reviewed.  Constitutional:      General: He is not in acute distress.    Appearance: He is well-developed.  HENT:     Head: Normocephalic and atraumatic.   Eyes:     Conjunctiva/sclera: Conjunctivae normal.     Pupils: Pupils are equal, round, and reactive to light.  Cardiovascular:     Rate and Rhythm: Normal rate and regular rhythm.     Heart sounds: Normal heart sounds.  Pulmonary:     Effort: Pulmonary effort is normal. No respiratory distress.     Breath sounds: Normal breath sounds. No decreased breath sounds or wheezing.  Chest:     Chest wall: Tenderness present.  No deformity, swelling, crepitus or edema.    Skin:    General: Skin is warm and dry.  Psychiatric:        Behavior: Behavior normal.         Assessment & Plan:   1. Fall, initial encounter - DG Ribs Unilateral W/Chest Right; Future  2. Contusion of rib on right side, initial encounter - traMADol (ULTRAM) 50 MG tablet; Take 1 tablet (50 mg total) by mouth every 6 (six) hours as needed.  Dispense: 28 tablet; Refill: 0  3. Closed fracture of one rib of right side, initial encounter - traMADol (ULTRAM) 50 MG tablet; Take 1 tablet (50 mg total) by mouth every 6 (six) hours as needed.  Dispense: 28 tablet; Refill: 0 Xray reveals probable fracture of right fifth rib  Continue all other maintenance medications as listed above.  Follow up plan: No follow-ups on file.  Educational handout given for instruction for daily deep breathing exercises to prevent pneumonia and other complications  Terald Sleeper PA-C [facility] [address]  [place], [place] [address] [phone]   06/13/2018, 9:40 PM

## 2018-06-18 ENCOUNTER — Ambulatory Visit (INDEPENDENT_AMBULATORY_CARE_PROVIDER_SITE_OTHER): Payer: Medicare Other

## 2018-06-18 ENCOUNTER — Encounter: Payer: Self-pay | Admitting: Family

## 2018-06-18 ENCOUNTER — Other Ambulatory Visit: Payer: Self-pay

## 2018-06-18 ENCOUNTER — Ambulatory Visit (INDEPENDENT_AMBULATORY_CARE_PROVIDER_SITE_OTHER): Payer: Medicare Other | Admitting: Family

## 2018-06-18 VITALS — BP 138/87 | HR 64 | Temp 97.0°F | Ht 68.0 in | Wt 273.4 lb

## 2018-06-18 DIAGNOSIS — M25511 Pain in right shoulder: Secondary | ICD-10-CM | POA: Diagnosis not present

## 2018-06-18 DIAGNOSIS — Y92009 Unspecified place in unspecified non-institutional (private) residence as the place of occurrence of the external cause: Secondary | ICD-10-CM

## 2018-06-18 DIAGNOSIS — W19XXXD Unspecified fall, subsequent encounter: Secondary | ICD-10-CM

## 2018-06-18 DIAGNOSIS — S42201D Unspecified fracture of upper end of right humerus, subsequent encounter for fracture with routine healing: Secondary | ICD-10-CM | POA: Diagnosis not present

## 2018-06-18 NOTE — Progress Notes (Signed)
Subjective:    Patient ID: [name], male    DOB: 1945/09/17, 73 y.o.   MRN: [id number]  Chief Complaint  Patient presents with  . right shoulder pain   Pt presents to the office today with right shoulder pain that started after falling on March 8.  Shoulder Pain   The pain is present in the right shoulder. This is a recurrent problem. The current episode started 1 to 4 weeks ago. There has been a history of trauma. The problem occurs constantly. The problem has been unchanged. The quality of the pain is described as aching. The pain is at a severity of 8/10. Associated symptoms include a limited range of motion. Pertinent negatives include no numbness or stiffness. The symptoms are aggravated by activity. [name] has tried acetaminophen, NSAIDS, oral narcotics and rest for the symptoms. The treatment provided no relief.      Review of Systems  Musculoskeletal: Negative for stiffness.  Neurological: Negative for numbness.  All other systems reviewed and are negative.      Objective:   Physical Exam Vitals signs reviewed.  Constitutional:      General: [name] is not in acute distress.    Appearance: [name] is well-developed.  HENT:     Head: Normocephalic.  Eyes:     General:        Right eye: No discharge.        Left eye: No discharge.     Pupils: Pupils are equal, round, and reactive to light.  Neck:     Musculoskeletal: Normal range of motion and neck supple.     Thyroid: No thyromegaly.  Cardiovascular:     Rate and Rhythm: Normal rate and regular rhythm.     Heart sounds: Normal heart sounds. No murmur.  Pulmonary:     Effort: Pulmonary effort is normal. No respiratory distress.     Breath sounds: Decreased breath sounds present. No wheezing.  Abdominal:     General: Bowel sounds are normal. There is no distension.     Palpations: Abdomen is soft.     Tenderness: There is no abdominal tenderness.  Musculoskeletal:        General: No tenderness.     Comments:  Unable to abduct right shoulder greater than 30 degress  Skin:    General: Skin is warm and dry.     Coloration: Skin is pale.     Findings: No erythema or rash.  Neurological:     Mental Status: [name] is alert and oriented to person, place, and time.     Cranial Nerves: No cranial nerve deficit.     Deep Tendon Reflexes: Reflexes are normal and symmetric.  Psychiatric:        Behavior: Behavior normal.        Thought Content: Thought content normal.        Judgment: Judgment normal.     BP 138/87   Pulse 64   Temp (!) 97 F (36.1 C) (Oral)   Ht 5\' 8"  (1.727 m)   Wt 273 lb 6.4 oz (124 kg)   BMI 41.57 kg/m        Assessment & Plan:  [name] comes in today with chief complaint of right shoulder pain   Diagnosis and orders addressed:  1. Fall in home, subsequent encounter - DG Shoulder Right; Future - Ambulatory referral to Orthopedic Surgery  2. Acute pain of right shoulder - DG Shoulder Right; Future - Ambulatory referral to Orthopedic  Surgery   Radiologists read x-ray as negative for acute fracture. However, the spacing looks different and based on exam patient is in a great deal of pain. Will do Ortho referral. Has appt today at 245 pm. Sling placed.     Evelina Dun, FNP

## 2018-06-18 NOTE — Patient Instructions (Signed)

## 2018-07-10 ENCOUNTER — Other Ambulatory Visit: Payer: Self-pay

## 2018-07-10 ENCOUNTER — Other Ambulatory Visit: Payer: Self-pay | Admitting: Urology

## 2018-07-10 ENCOUNTER — Other Ambulatory Visit (HOSPITAL_COMMUNITY): Payer: Self-pay | Admitting: Urology

## 2018-07-10 ENCOUNTER — Encounter: Payer: Self-pay | Admitting: Family Medicine

## 2018-07-10 ENCOUNTER — Ambulatory Visit (INDEPENDENT_AMBULATORY_CARE_PROVIDER_SITE_OTHER): Payer: Medicare Other | Admitting: Family Medicine

## 2018-07-10 DIAGNOSIS — R7303 Prediabetes: Secondary | ICD-10-CM

## 2018-07-10 DIAGNOSIS — N183 Chronic kidney disease, stage 3 unspecified: Secondary | ICD-10-CM

## 2018-07-10 DIAGNOSIS — I10 Essential (primary) hypertension: Secondary | ICD-10-CM | POA: Diagnosis not present

## 2018-07-10 DIAGNOSIS — K219 Gastro-esophageal reflux disease without esophagitis: Secondary | ICD-10-CM

## 2018-07-10 DIAGNOSIS — R911 Solitary pulmonary nodule: Secondary | ICD-10-CM

## 2018-07-10 DIAGNOSIS — E782 Mixed hyperlipidemia: Secondary | ICD-10-CM

## 2018-07-10 DIAGNOSIS — N184 Chronic kidney disease, stage 4 (severe): Secondary | ICD-10-CM | POA: Insufficient documentation

## 2018-07-10 NOTE — Progress Notes (Signed)
Virtual Visit via telephone Note  I connected with [name] on 07/10/18 at 1024 by telephone and verified that I am speaking with the correct person using two identifiers. [name] is currently located at home and no other people are currently with her during visit. The provider, [name], MD is located in their office at time of visit.  Call ended at 1036  I discussed the limitations, risks, security and privacy concerns of performing an evaluation and management service by telephone and the availability of in person appointments. I also discussed with the patient that there may be a patient responsible charge related to this service. The patient expressed understanding and agreed to proceed.   History and Present Illness: Hyperlipidemia Patient is coming in for recheck of his hyperlipidemia. The patient is currently taking no medication currently and we have been monitoring for now. They deny any issues with myalgias or history of liver damage from it. They deny any focal numbness or weakness or chest pain.   Hypertension Patient is currently on amlodipine and carvedilol and losartan, and their blood pressure today is unknown because patient is at home and did not take it today.. Patient denies any lightheadedness or dizziness. Patient denies headaches, blurred vision, chest pains, shortness of breath, or weakness. Denies any side effects from medication and is content with current medication.   Patient does have stage III CKD but a lot of its due to prosthetic issues and he does have a urologist and Dr. [name] who is helping manage this, is doing a PET scan but may consider renal ultrasound in the future and possible nephrology referral, will discuss this in the future, due to the coronavirus right now we are trying not to get them out of the house  GERD Patient is currently on Protonix.  She denies any major symptoms or abdominal pain or belching or burping. She  denies any blood in her stool or lightheadedness or dizziness.   Prediabetes Patient comes in today for recheck of his diabetes. Patient has been currently taking no medication. Patient is currently on an ACE inhibitor/ARB. Patient has not seen an ophthalmologist this year. Patient denies any issues with their feet.   No diagnosis found.  Outpatient Encounter Medications as of 07/10/2018  Medication Sig  . allopurinol (ZYLOPRIM) 300 MG tablet Take 1 tablet (300 mg total) by mouth daily.  [name] amLODipine (NORVASC) 10 MG tablet Take 1 tablet (10 mg total) by mouth daily. (Needs to be seen before next refill)  . carvedilol (COREG) 25 MG tablet Take 1 tablet (25 mg total) by mouth 2 (two) times daily.  . DULoxetine (CYMBALTA) 60 MG capsule TAKE 1 CAPSULE BY MOUTH ONCE DAILY IN THE EVENING  . losartan (COZAAR) 50 MG tablet Take 1 tablet (50 mg total) by mouth daily.  [name] oxybutynin (DITROPAN-XL) 10 MG 24 hr tablet Take 10 mg by mouth daily.  . pantoprazole (PROTONIX) 40 MG tablet Take 1 tablet (40 mg total) by mouth 2 (two) times daily.  . traMADol (ULTRAM) 50 MG tablet Take 1 tablet (50 mg total) by mouth every 6 (six) hours as needed. (Patient not taking: Reported on 06/18/2018)   Facility-Administered Encounter Medications as of 07/10/2018  Medication  . cyanocobalamin ((VITAMIN B-12)) injection 1,000 mcg    Review of Systems  Constitutional: Negative for chills and fever.  Eyes: Negative for visual disturbance.  Respiratory: Negative for shortness of breath and wheezing.   Cardiovascular: Negative for chest pain  and leg swelling.  Musculoskeletal: Negative for back pain and gait problem.  Skin: Negative for rash.  Neurological: Positive for weakness (Patient had another fall and some weakness and broke another rib, we are monitoring for now, he already did physical therapy round). Negative for dizziness and numbness.  All other systems reviewed and are negative.   Observations/Objective:  Patient sounds comfortable and in no acute distress  Assessment and Plan: Problem List Items Addressed This Visit      Cardiovascular and Mediastinum   Essential hypertension, benign     Digestive   GERD     Genitourinary   CKD (chronic kidney disease), stage III ([place])     Other   Hyperlipidemia   Prediabetes - Primary       Follow Up Instructions: Return in 3 months for blood work and hypertension and cholesterol and CKD recheck    I discussed the assessment and treatment plan with the patient. The patient was provided an opportunity to ask questions and all were answered. The patient agreed with the plan and demonstrated an understanding of the instructions.   The patient was advised to call back or seek an in-person evaluation if the symptoms worsen or if the condition fails to improve as anticipated.  The above assessment and management plan was discussed with the patient. The patient verbalized understanding of and has agreed to the management plan. Patient is aware to call the clinic if symptoms persist or worsen. Patient is aware when to return to the clinic for a follow-up visit. Patient educated on when it is appropriate to go to the emergency department.    I provided 12 minutes of non-face-to-face time during this encounter.    Worthy Rancher, MD

## 2018-08-01 ENCOUNTER — Ambulatory Visit (INDEPENDENT_AMBULATORY_CARE_PROVIDER_SITE_OTHER): Payer: Medicare Other | Admitting: Family Medicine

## 2018-08-01 ENCOUNTER — Encounter: Payer: Self-pay | Admitting: Family Medicine

## 2018-08-01 ENCOUNTER — Other Ambulatory Visit: Payer: Self-pay

## 2018-08-01 DIAGNOSIS — H938X1 Other specified disorders of right ear: Secondary | ICD-10-CM | POA: Diagnosis not present

## 2018-08-01 DIAGNOSIS — R42 Dizziness and giddiness: Secondary | ICD-10-CM | POA: Diagnosis not present

## 2018-08-01 MED ORDER — PREDNISONE 20 MG PO TABS: ORAL_TABLET | ORAL | 0 refills | Status: DC

## 2018-08-01 MED ORDER — MECLIZINE HCL 25 MG PO TABS: 25.0000 mg | ORAL_TABLET | Freq: Three times a day (TID) | ORAL | 3 refills | Status: DC | PRN

## 2018-08-01 NOTE — Progress Notes (Signed)
Virtual Visit via telephone Note  I connected with [name] on 08/01/18 at 1522 by telephone and verified that I am speaking with the correct person using two identifiers. [name] is currently located at home and no other people are currently with her during visit. The provider, [name] Seraphina Mitchner, MD is located in their office at time of visit.  Call ended at 1531  I discussed the limitations, risks, security and privacy concerns of performing an evaluation and management service by telephone and the availability of in person appointments. I also discussed with the patient that there may be a patient responsible charge related to this service. The patient expressed understanding and agreed to proceed.   History and Present Illness: Patient had a fall 3 days ago and is still having dizziness.  He is having soreness behind his ear. Patient has seen a neurologist and an ENT and he still having recurrent dizziness. He denies any fevers or chills, and nothing. He had another hard fall and his dizziness is improving.  He keeps getting these episodes where he is dizzy for a few days.  We even saw a specialist over at [facility] and did not get any answers for this.  No diagnosis found.  Outpatient Encounter Medications as of 08/01/2018  Medication Sig  . allopurinol (ZYLOPRIM) 300 MG tablet Take 1 tablet (300 mg total) by mouth daily.  [name] amLODipine (NORVASC) 10 MG tablet Take 1 tablet (10 mg total) by mouth daily. (Needs to be seen before next refill)  . carvedilol (COREG) 25 MG tablet Take 1 tablet (25 mg total) by mouth 2 (two) times daily.  . DULoxetine (CYMBALTA) 60 MG capsule TAKE 1 CAPSULE BY MOUTH ONCE DAILY IN THE EVENING  . losartan (COZAAR) 50 MG tablet Take 1 tablet (50 mg total) by mouth daily.  [name] oxybutynin (DITROPAN-XL) 10 MG 24 hr tablet Take 10 mg by mouth daily.  . pantoprazole (PROTONIX) 40 MG tablet Take 1 tablet (40 mg total) by mouth 2 (two) times daily.  .  traMADol (ULTRAM) 50 MG tablet Take 1 tablet (50 mg total) by mouth every 6 (six) hours as needed. (Patient not taking: Reported on 06/18/2018)   Facility-Administered Encounter Medications as of 08/01/2018  Medication  . cyanocobalamin ((VITAMIN B-12)) injection 1,000 mcg    Review of Systems  Constitutional: Negative for chills and fever.  HENT: Positive for ear pain. Negative for congestion, dental problem, ear discharge, facial swelling, hearing loss, sinus pressure, sinus pain, sneezing and sore throat.   Respiratory: Negative for shortness of breath and wheezing.   Cardiovascular: Negative for chest pain and leg swelling.  Musculoskeletal: Positive for arthralgias. Negative for back pain and gait problem.  Skin: Negative for rash.  Neurological: Positive for dizziness and light-headedness. Negative for weakness and numbness.  All other systems reviewed and are negative.   Observations/Objective: Sounds comfortable on the phone in no acute distress.  Assessment and Plan: Problem List Items Addressed This Visit    None    Visit Diagnoses    Dizziness    -  Primary   Relevant Medications   meclizine (ANTIVERT) 25 MG tablet   predniSONE (DELTASONE) 20 MG tablet   Ear congestion, right       Relevant Medications   meclizine (ANTIVERT) 25 MG tablet   predniSONE (DELTASONE) 20 MG tablet       Follow Up Instructions:  Follow-up as needed   I discussed the assessment and treatment plan with  the patient. The patient was provided an opportunity to ask questions and all were answered. The patient agreed with the plan and demonstrated an understanding of the instructions.   The patient was advised to call back or seek an in-person evaluation if the symptoms worsen or if the condition fails to improve as anticipated.  The above assessment and management plan was discussed with the patient. The patient verbalized understanding of and has agreed to the management plan. Patient is  aware to call the clinic if symptoms persist or worsen. Patient is aware when to return to the clinic for a follow-up visit. Patient educated on when it is appropriate to go to the emergency department.    I provided 9 minutes of non-face-to-face time during this encounter.    Worthy Rancher, MD

## 2018-08-05 ENCOUNTER — Telehealth: Payer: Self-pay | Admitting: Family Medicine

## 2018-08-05 MED ORDER — CIPROFLOXACIN HCL 500 MG PO TABS: 500.0000 mg | ORAL_TABLET | Freq: Two times a day (BID) | ORAL | 0 refills | Status: DC

## 2018-08-05 NOTE — Telephone Encounter (Signed)
Please let the patient know that I have sent in an antibiotic and ciprofloxacin for him so that he can take for his mastoiditis per ENT and we can evaluate after that.

## 2018-08-05 NOTE — Telephone Encounter (Signed)
Patient aware.

## 2018-08-12 ENCOUNTER — Other Ambulatory Visit: Payer: Self-pay

## 2018-08-12 ENCOUNTER — Ambulatory Visit (HOSPITAL_COMMUNITY)
Admission: RE | Admit: 2018-08-12 | Discharge: 2018-08-12 | Disposition: A | Discharge status: Home / Self Care | Payer: Medicare Other | Source: Ambulatory Visit | Attending: Urology | Admitting: Urology

## 2018-08-12 DIAGNOSIS — R918 Other nonspecific abnormal finding of lung field: Secondary | ICD-10-CM | POA: Diagnosis not present

## 2018-08-12 DIAGNOSIS — R911 Solitary pulmonary nodule: Secondary | ICD-10-CM | POA: Insufficient documentation

## 2018-08-20 DIAGNOSIS — C61 Malignant neoplasm of prostate: Secondary | ICD-10-CM | POA: Diagnosis not present

## 2018-08-21 DIAGNOSIS — N183 Chronic kidney disease, stage 3 (moderate): Secondary | ICD-10-CM | POA: Diagnosis not present

## 2018-08-23 ENCOUNTER — Ambulatory Visit (INDEPENDENT_AMBULATORY_CARE_PROVIDER_SITE_OTHER): Payer: Medicare Other | Admitting: Urology

## 2018-08-23 DIAGNOSIS — R351 Nocturia: Secondary | ICD-10-CM | POA: Diagnosis not present

## 2018-08-23 DIAGNOSIS — R911 Solitary pulmonary nodule: Secondary | ICD-10-CM | POA: Diagnosis not present

## 2018-08-23 DIAGNOSIS — R801 Persistent proteinuria, unspecified: Secondary | ICD-10-CM

## 2018-08-23 DIAGNOSIS — C61 Malignant neoplasm of prostate: Secondary | ICD-10-CM

## 2018-08-23 DIAGNOSIS — N3941 Urge incontinence: Secondary | ICD-10-CM

## 2018-09-09 ENCOUNTER — Ambulatory Visit: Payer: Medicare Other | Admitting: Family Medicine

## 2018-09-10 DIAGNOSIS — Z882 Allergy status to sulfonamides status: Secondary | ICD-10-CM | POA: Diagnosis not present

## 2018-09-10 DIAGNOSIS — S29009A Unspecified injury of muscle and tendon of unspecified wall of thorax, initial encounter: Secondary | ICD-10-CM | POA: Diagnosis not present

## 2018-09-10 DIAGNOSIS — R0781 Pleurodynia: Secondary | ICD-10-CM | POA: Diagnosis not present

## 2018-09-10 DIAGNOSIS — G8911 Acute pain due to trauma: Secondary | ICD-10-CM | POA: Diagnosis not present

## 2018-09-10 DIAGNOSIS — F17200 Nicotine dependence, unspecified, uncomplicated: Secondary | ICD-10-CM | POA: Diagnosis not present

## 2018-09-10 DIAGNOSIS — K219 Gastro-esophageal reflux disease without esophagitis: Secondary | ICD-10-CM | POA: Diagnosis not present

## 2018-09-10 DIAGNOSIS — I10 Essential (primary) hypertension: Secondary | ICD-10-CM | POA: Diagnosis not present

## 2018-09-10 DIAGNOSIS — R55 Syncope and collapse: Secondary | ICD-10-CM | POA: Diagnosis not present

## 2018-09-10 DIAGNOSIS — R9431 Abnormal electrocardiogram [ECG] [EKG]: Secondary | ICD-10-CM | POA: Diagnosis not present

## 2018-09-10 DIAGNOSIS — S299XXA Unspecified injury of thorax, initial encounter: Secondary | ICD-10-CM | POA: Diagnosis not present

## 2018-09-10 DIAGNOSIS — R52 Pain, unspecified: Secondary | ICD-10-CM | POA: Diagnosis not present

## 2018-09-10 DIAGNOSIS — Z885 Allergy status to narcotic agent status: Secondary | ICD-10-CM | POA: Diagnosis not present

## 2018-09-10 DIAGNOSIS — W19XXXA Unspecified fall, initial encounter: Secondary | ICD-10-CM | POA: Diagnosis not present

## 2018-09-14 ENCOUNTER — Other Ambulatory Visit: Payer: Self-pay | Admitting: Family Medicine

## 2018-10-03 ENCOUNTER — Ambulatory Visit (INDEPENDENT_AMBULATORY_CARE_PROVIDER_SITE_OTHER): Payer: Medicare Other | Admitting: Otolaryngology

## 2018-10-03 DIAGNOSIS — R42 Dizziness and giddiness: Secondary | ICD-10-CM | POA: Diagnosis not present

## 2018-10-03 DIAGNOSIS — H903 Sensorineural hearing loss, bilateral: Secondary | ICD-10-CM

## 2018-10-11 DIAGNOSIS — R42 Dizziness and giddiness: Secondary | ICD-10-CM | POA: Diagnosis not present

## 2018-10-11 DIAGNOSIS — H903 Sensorineural hearing loss, bilateral: Secondary | ICD-10-CM | POA: Diagnosis not present

## 2018-10-15 ENCOUNTER — Other Ambulatory Visit: Payer: Self-pay | Admitting: Urology

## 2018-10-15 ENCOUNTER — Other Ambulatory Visit (HOSPITAL_COMMUNITY): Payer: Self-pay | Admitting: Urology

## 2018-10-15 DIAGNOSIS — R911 Solitary pulmonary nodule: Secondary | ICD-10-CM

## 2018-10-17 DIAGNOSIS — R2689 Other abnormalities of gait and mobility: Secondary | ICD-10-CM | POA: Diagnosis not present

## 2018-10-17 DIAGNOSIS — R42 Dizziness and giddiness: Secondary | ICD-10-CM | POA: Diagnosis not present

## 2018-10-24 DIAGNOSIS — H8111 Benign paroxysmal vertigo, right ear: Secondary | ICD-10-CM | POA: Diagnosis not present

## 2018-10-24 DIAGNOSIS — R2689 Other abnormalities of gait and mobility: Secondary | ICD-10-CM | POA: Diagnosis not present

## 2018-10-24 DIAGNOSIS — R42 Dizziness and giddiness: Secondary | ICD-10-CM | POA: Diagnosis not present

## 2018-10-29 DIAGNOSIS — R42 Dizziness and giddiness: Secondary | ICD-10-CM | POA: Diagnosis not present

## 2018-10-29 DIAGNOSIS — H8113 Benign paroxysmal vertigo, bilateral: Secondary | ICD-10-CM | POA: Diagnosis not present

## 2018-10-31 ENCOUNTER — Other Ambulatory Visit: Payer: Self-pay | Admitting: Family Medicine

## 2018-10-31 DIAGNOSIS — I10 Essential (primary) hypertension: Secondary | ICD-10-CM

## 2018-11-05 DIAGNOSIS — R2689 Other abnormalities of gait and mobility: Secondary | ICD-10-CM | POA: Diagnosis not present

## 2018-11-05 DIAGNOSIS — R42 Dizziness and giddiness: Secondary | ICD-10-CM | POA: Diagnosis not present

## 2018-11-07 ENCOUNTER — Telehealth: Payer: Self-pay | Admitting: Family Medicine

## 2018-11-07 DIAGNOSIS — I10 Essential (primary) hypertension: Secondary | ICD-10-CM

## 2018-11-07 MED ORDER — PANTOPRAZOLE SODIUM 40 MG PO TBEC: 40.0000 mg | DELAYED_RELEASE_TABLET | Freq: Two times a day (BID) | ORAL | 1 refills | Status: DC

## 2018-11-07 MED ORDER — AMLODIPINE BESYLATE 10 MG PO TABS: ORAL_TABLET | ORAL | 0 refills | Status: DC

## 2018-11-07 NOTE — Telephone Encounter (Signed)
Refills sent to [facility].  Patient aware

## 2018-11-08 ENCOUNTER — Other Ambulatory Visit: Payer: Self-pay

## 2018-11-08 ENCOUNTER — Other Ambulatory Visit: Payer: Self-pay | Admitting: Family Medicine

## 2018-11-08 ENCOUNTER — Ambulatory Visit (INDEPENDENT_AMBULATORY_CARE_PROVIDER_SITE_OTHER): Payer: Medicare Other | Admitting: Family Medicine

## 2018-11-08 DIAGNOSIS — F339 Major depressive disorder, recurrent, unspecified: Secondary | ICD-10-CM

## 2018-11-08 DIAGNOSIS — R109 Unspecified abdominal pain: Secondary | ICD-10-CM

## 2018-11-08 DIAGNOSIS — G6289 Other specified polyneuropathies: Secondary | ICD-10-CM

## 2018-11-08 MED ORDER — ALLOPURINOL 300 MG PO TABS: 300.0000 mg | ORAL_TABLET | Freq: Every day | ORAL | 1 refills | Status: DC

## 2018-11-08 MED ORDER — DULOXETINE HCL 60 MG PO CPEP: ORAL_CAPSULE | ORAL | 1 refills | Status: DC

## 2018-11-08 NOTE — Telephone Encounter (Signed)
What is the name of the medication? Duloxetine 60 mg, Allopurinol 300 mg Switch from [facility] to [facility] Have you contacted your pharmacy to request a refill? NO  Which pharmacy would you like this sent to? [facility]   Patient notified that their request is being sent to the clinical staff for review and that they should receive a call once it is complete. If they do not receive a call within 24 hours they can check with their pharmacy or our office.

## 2018-11-08 NOTE — Progress Notes (Signed)
Telephone visit  Subjective: CC: stomach pain PCP: [name], [name], MD [name] is a 73 y.o. male calls for telephone consult today. Patient provides verbal consent for consult held via phone.  Location of patient: home Location of provider: [facility] Others present for call: family member  70. Stomach pain Patient report some pain along the left of his navel that is worse w/ coughing.  Denies constipation, diarrhea, nausea, vomiting, burning in his stomach.  Denies use of NSAIDs.  Denies TTP or palpable knots/ masses.  No pain with food.   ROS: Per HPI  Allergies  Allergen Reactions  . Carbidopa-Levodopa Other (See Comments)    hallunications  . Morphine Sulfate Nausea And Vomiting  . Sulfa Antibiotics Nausea And Vomiting  . Sulfacetamide Sodium Nausea And Vomiting   Past Medical History:  Diagnosis Date  . Aortic atherosclerosis ([place]) followed by dr Trula Slade   2004  s/p  closure Penetrating atherosclerotic ulcer of infarenal aorta w/ endovascular stent graft  . Arthritis   . CKD (chronic kidney disease), stage III ([place])   . Closed fracture of head of humerus 07/2017   right shoulder from fall; 09-25-2017 per pt no surgical intervention, only wore sling, intermittant pain  . Coronary atherosclerosis of native coronary artery cardiologist-  dr Zigmund Gottron   positive myoview for ischemia 09-27-2009;  10-01-2009 per cardiac cath-- minimal nonobstructive CAD w/ 30% LAD   . ED (erectile dysfunction)   . Emphysema/COPD [facility])    followed by pcp--- last exacerbation 09-12-2017;  09-25-2017 per pt no cough, sob or congestion  . Essential hypertension, benign   . First degree heart block   . Frequency of urination   . GERD (gastroesophageal reflux disease)   . Gout    09-25-2017  per pt stable,  last episode 2015  . Heart murmur   . History of adenomatous polyp of colon   . History of closed head injury 2005   per pt residual resolved  . History of external beam  radiation therapy    completed 10/ 2015 for prostate cancer  . History of gastric ulcer 2014  . History of kidney stones   . History of pneumothorax    spontaneous pneumo treated w/ chest tube  . History of rib fracture 07/2017   from fall,  right side 6th,7th,8th  . Neuropathy   . OAB (overactive bladder)   . OSA (obstructive sleep apnea)    Intolerant of CPAP  . Prostate cancer [facility]) urologist-  dr wrenn/  oncologist-- dr Alen Blew and dr Tammi Klippel   dx 09-26-2011 via bx-- Stage T3b,N0,  Gleason 4+4, PSA 11.2 with METs to external iliac lymph node(resolved with ADT)-- treated w/ ADT ;   05/2013 staging work-up for rising PSA , started casodex and completed radiation therapy 10/ 2015;   rising PSA post treatment  . RLS (restless legs syndrome)   . Ureteral neocystostomy bleed    left side  . Urge urinary incontinence     Current Outpatient Medications:  .  allopurinol (ZYLOPRIM) 300 MG tablet, Take 1 tablet (300 mg total) by mouth daily., Disp: 90 tablet, Rfl: 1 .  amLODipine (NORVASC) 10 MG tablet, TAKE 1 TABLET BY MOUTH ONCE DAILY (NEEDS  TO  BE  SEEN  BEFORE  NEXT  REFILL), Disp: 90 tablet, Rfl: 0 .  carvedilol (COREG) 25 MG tablet, Take 1 tablet (25 mg total) by mouth 2 (two) times daily., Disp: 180 tablet, Rfl: 1 .  ciprofloxacin (CIPRO) 500 MG tablet,  Take 1 tablet (500 mg total) by mouth 2 (two) times daily., Disp: 20 tablet, Rfl: 0 .  DULoxetine (CYMBALTA) 60 MG capsule, TAKE 1 CAPSULE BY MOUTH ONCE DAILY IN THE EVENING, Disp: 90 capsule, Rfl: 1 .  losartan (COZAAR) 50 MG tablet, Take 1 tablet by mouth once daily, Disp: 90 tablet, Rfl: 0 .  meclizine (ANTIVERT) 25 MG tablet, Take 1 tablet (25 mg total) by mouth 3 (three) times daily as needed for dizziness., Disp: 60 tablet, Rfl: 3 .  oxybutynin (DITROPAN-XL) 10 MG 24 hr tablet, Take 10 mg by mouth daily., Disp: , Rfl:  .  pantoprazole (PROTONIX) 40 MG tablet, Take 1 tablet (40 mg total) by mouth 2 (two) times daily., Disp: 180  tablet, Rfl: 1 .  predniSONE (DELTASONE) 20 MG tablet, 2 po at same time daily for 5 days, Disp: 10 tablet, Rfl: 0 .  traMADol (ULTRAM) 50 MG tablet, Take 1 tablet (50 mg total) by mouth every 6 (six) hours as needed. (Patient not taking: Reported on 06/18/2018), Disp: 28 tablet, Rfl: 0  Assessment/ Plan: 73 y.o. male   1. Left sided abdominal pain of unknown cause Likely musculoskeletal in nature given aggravation with intra-abdominal pressure.  Rule out hernia.  Ultrasound ordered.  May need to consider obtaining CT abdomen pelvis if inconclusive, particularly given history of prostate cancer.  We discussed red flags.  He will follow-up PRN - US Abdomen Limited; Future   Start time: 4:51pm End time: 4:58pm  Total time spent on patient care (including telephone call/ virtual visit): 13 minutes  [place], [place] [phone]

## 2018-11-08 NOTE — Telephone Encounter (Signed)
Refills sent

## 2018-11-11 ENCOUNTER — Other Ambulatory Visit: Payer: Self-pay

## 2018-11-11 ENCOUNTER — Ambulatory Visit (INDEPENDENT_AMBULATORY_CARE_PROVIDER_SITE_OTHER): Payer: Medicare Other | Admitting: Otolaryngology

## 2018-11-12 ENCOUNTER — Other Ambulatory Visit: Payer: Self-pay | Admitting: Family Medicine

## 2018-11-12 DIAGNOSIS — R109 Unspecified abdominal pain: Secondary | ICD-10-CM

## 2018-11-12 DIAGNOSIS — R42 Dizziness and giddiness: Secondary | ICD-10-CM | POA: Diagnosis not present

## 2018-11-12 DIAGNOSIS — R2689 Other abnormalities of gait and mobility: Secondary | ICD-10-CM | POA: Diagnosis not present

## 2018-11-13 ENCOUNTER — Other Ambulatory Visit (HOSPITAL_COMMUNITY): Payer: Self-pay | Admitting: Otolaryngology

## 2018-11-13 ENCOUNTER — Other Ambulatory Visit: Payer: Self-pay | Admitting: Otolaryngology

## 2018-11-13 DIAGNOSIS — R42 Dizziness and giddiness: Secondary | ICD-10-CM

## 2018-11-13 DIAGNOSIS — H918X9 Other specified hearing loss, unspecified ear: Secondary | ICD-10-CM

## 2018-11-19 DIAGNOSIS — R42 Dizziness and giddiness: Secondary | ICD-10-CM | POA: Diagnosis not present

## 2018-11-19 DIAGNOSIS — R2689 Other abnormalities of gait and mobility: Secondary | ICD-10-CM | POA: Diagnosis not present

## 2018-11-21 ENCOUNTER — Ambulatory Visit (HOSPITAL_COMMUNITY)
Admission: RE | Admit: 2018-11-21 | Discharge: 2018-11-21 | Disposition: A | Discharge status: Home / Self Care | Payer: Medicare Other | Source: Ambulatory Visit | Attending: Family Medicine | Admitting: Family Medicine

## 2018-11-21 ENCOUNTER — Other Ambulatory Visit: Payer: Self-pay

## 2018-11-21 DIAGNOSIS — K824 Cholesterolosis of gallbladder: Secondary | ICD-10-CM | POA: Diagnosis not present

## 2018-11-21 DIAGNOSIS — R109 Unspecified abdominal pain: Secondary | ICD-10-CM

## 2018-11-21 DIAGNOSIS — N281 Cyst of kidney, acquired: Secondary | ICD-10-CM | POA: Diagnosis not present

## 2018-11-25 DIAGNOSIS — C61 Malignant neoplasm of prostate: Secondary | ICD-10-CM | POA: Diagnosis not present

## 2018-11-27 ENCOUNTER — Other Ambulatory Visit (HOSPITAL_COMMUNITY): Payer: Self-pay | Admitting: Otolaryngology

## 2018-11-27 ENCOUNTER — Ambulatory Visit (HOSPITAL_COMMUNITY): Payer: Medicare Other

## 2018-11-27 ENCOUNTER — Encounter (HOSPITAL_COMMUNITY): Payer: Self-pay

## 2018-11-27 ENCOUNTER — Ambulatory Visit (INDEPENDENT_AMBULATORY_CARE_PROVIDER_SITE_OTHER): Payer: Medicare Other | Admitting: *Deleted

## 2018-11-27 ENCOUNTER — Ambulatory Visit (HOSPITAL_COMMUNITY)
Admission: RE | Admit: 2018-11-27 | Discharge: 2018-11-27 | Disposition: A | Discharge status: Home / Self Care | Payer: Medicare Other | Source: Ambulatory Visit | Attending: Otolaryngology | Admitting: Otolaryngology

## 2018-11-27 ENCOUNTER — Other Ambulatory Visit: Payer: Self-pay

## 2018-11-27 VITALS — Ht 68.0 in | Wt 273.4 lb

## 2018-11-27 DIAGNOSIS — H918X9 Other specified hearing loss, unspecified ear: Secondary | ICD-10-CM | POA: Diagnosis not present

## 2018-11-27 DIAGNOSIS — Z0000 Encounter for general adult medical examination without abnormal findings: Secondary | ICD-10-CM

## 2018-11-27 DIAGNOSIS — H918X3 Other specified hearing loss, bilateral: Secondary | ICD-10-CM

## 2018-11-27 DIAGNOSIS — R42 Dizziness and giddiness: Secondary | ICD-10-CM

## 2018-11-27 DIAGNOSIS — H903 Sensorineural hearing loss, bilateral: Secondary | ICD-10-CM | POA: Diagnosis not present

## 2018-11-27 LAB — POCT I-STAT CREATININE: Creatinine, Ser: 2.1 mg/dL — ABNORMAL HIGH (ref 0.61–1.24)

## 2018-11-27 MED ORDER — IOHEXOL 300 MG/ML  SOLN: 75.0000 mL | Freq: Once | INTRAMUSCULAR | Status: DC | PRN

## 2018-11-27 NOTE — Patient Instructions (Signed)
[name] , Thank you for taking time to come for your Medicare Wellness Visit. I appreciate your ongoing commitment to your health goals. Please review the following plan we discussed and let me know if I can assist you in the future.   These are the goals we discussed: Goals     DIET - DECREASE SODA OR JUICE INTAKE       This is a list of the screening recommended for you and due dates:  Health Maintenance  Topic Date Due   Tetanus Vaccine  02/06/1965   Pneumonia vaccines (2 of 2 - PPSV23) 12/30/2015   Flu Shot  11/02/2018    Hepatitis C: One time screening is recommended by Center for Disease Control  (CDC) for  adults born from [birth year] through 1966.   02/26/2019*   Colon Cancer Screening  04/27/2026  *Topic was postponed. The date shown is not the original due date.    Preventive Care 37 Years and Older, Male Preventive care refers to lifestyle choices and visits with your health care provider that can promote health and wellness. This includes:  A yearly physical exam. This is also called an annual well check.  Regular dental and eye exams.  Immunizations.  Screening for certain conditions.  Healthy lifestyle choices, such as diet and exercise. What can I expect for my preventive care visit? Physical exam Your health care provider will check:  Height and weight. These may be used to calculate body mass index (BMI), which is a measurement that tells if you are at a healthy weight.  Heart rate and blood pressure.  Your skin for abnormal spots. Counseling Your health care provider may ask you questions about:  Alcohol, tobacco, and drug use.  Emotional well-being.  Home and relationship well-being.  Sexual activity.  Eating habits.  History of falls.  Memory and ability to understand (cognition).  Work and work Statistician. What immunizations do I need?  Influenza (flu) vaccine  This is recommended every year. Tetanus, diphtheria, and  pertussis (Tdap) vaccine  You may need a Td booster every 10 years. Varicella (chickenpox) vaccine  You may need this vaccine if you have not already been vaccinated. Zoster (shingles) vaccine  You may need this after age 43. Pneumococcal conjugate (PCV13) vaccine  One dose is recommended after age 2. Pneumococcal polysaccharide (PPSV23) vaccine  One dose is recommended after age 65. Measles, mumps, and rubella (MMR) vaccine  You may need at least one dose of MMR if you were born in 1957 or later. You may also need a second dose. Meningococcal conjugate (MenACWY) vaccine  You may need this if you have certain conditions. Hepatitis A vaccine  You may need this if you have certain conditions or if you travel or work in places where you may be exposed to hepatitis A. Hepatitis B vaccine  You may need this if you have certain conditions or if you travel or work in places where you may be exposed to hepatitis B. Haemophilus influenzae type b (Hib) vaccine  You may need this if you have certain conditions. You may receive vaccines as individual doses or as more than one vaccine together in one shot (combination vaccines). Talk with your health care provider about the risks and benefits of combination vaccines. What tests do I need? Blood tests  Lipid and cholesterol levels. These may be checked every 5 years, or more frequently depending on your overall health.  Hepatitis C test.  Hepatitis B test. Screening  Lung cancer screening. You may have this screening every year starting at age 46 if you have a 30-pack-year history of smoking and currently smoke or have quit within the past 15 years.  Colorectal cancer screening. All adults should have this screening starting at age 37 and continuing until age 10. Your health care provider may recommend screening at age 37 if you are at increased risk. You will have tests every 1-10 years, depending on your results and the type of  screening test.  Prostate cancer screening. Recommendations will vary depending on your family history and other risks.  Diabetes screening. This is done by checking your blood sugar (glucose) after you have not eaten for a while (fasting). You may have this done every 1-3 years.  Abdominal aortic aneurysm (AAA) screening. You may need this if you are a current or former smoker.  Sexually transmitted disease (STD) testing. Follow these instructions at home: Eating and drinking  Eat a diet that includes fresh fruits and vegetables, whole grains, lean protein, and low-fat dairy products. Limit your intake of foods with high amounts of sugar, saturated fats, and salt.  Take vitamin and mineral supplements as recommended by your health care provider.  Do not drink alcohol if your health care provider tells you not to drink.  If you drink alcohol: ? Limit how much you have to 0-2 drinks a day. ? Be aware of how much alcohol is in your drink. In the [place] one drink equals one 12 oz bottle of beer (355 mL), one 5 oz glass of wine (148 mL), or one 1 oz glass of hard liquor (44 mL). Lifestyle  Take daily care of your teeth and gums.  Stay active. Exercise for at least 30 minutes on 5 or more days each week.  Do not use any products that contain nicotine or tobacco, such as cigarettes, e-cigarettes, and chewing tobacco. If you need help quitting, ask your health care provider.  If you are sexually active, practice safe sex. Use a condom or other form of protection to prevent STIs (sexually transmitted infections).  Talk with your health care provider about taking a low-dose aspirin or statin. What's next?  Visit your health care provider once a year for a well check visit.  Ask your health care provider how often you should have your eyes and teeth checked.  Stay up to date on all vaccines. This information is not intended to replace advice given to you by your health care provider.  Make sure you discuss any questions you have with your health care provider. Document Released: 04/16/2015 Document Revised: 03/14/2018 Document Reviewed: 03/14/2018 Elsevier Patient Education  2020 [place] Directive  Advance directives are legal documents that let you make choices ahead of time about your health care and medical treatment in case you become unable to communicate for yourself. Advance directives are a way for you to communicate your wishes to family, friends, and health care providers. This can help convey your decisions about end-of-life care if you become unable to communicate. Discussing and writing advance directives should happen over time rather than all at once. Advance directives can be changed depending on your situation and what you want, even after you have signed the advance directives. If you do not have an advance directive, some states assign family decision makers to act on your behalf based on how closely you are related to them. Each state has its own laws regarding advance directives. You may want  to check with your health care provider, attorney, or state representative about the laws in your state. There are different types of advance directives, such as:  Medical power of attorney.  Living will.  Do not resuscitate (DNR) or do not attempt resuscitation (DNAR) order. Health care proxy and medical power of attorney A health care proxy, also called a health care agent, is a person who is appointed to make medical decisions for you in cases in which you are unable to make the decisions yourself. Generally, people choose someone they know well and trust to represent their preferences. Make sure to ask this person for an agreement to act as your proxy. A proxy may have to exercise judgment in the event of a medical decision for which your wishes are not known. A medical power of attorney is a legal document that names your health care proxy.  Depending on the laws in your state, after the document is written, it may also need to be:  Signed.  Notarized.  Dated.  Copied.  Witnessed.  Incorporated into your medical record. You may also want to appoint someone to manage your financial affairs in a situation in which you are unable to do so. This is called a durable power of attorney for finances. It is a separate legal document from the durable power of attorney for health care. You may choose the same person or someone different from your health care proxy to act as your agent in financial matters. If you do not appoint a proxy, or if there is a concern that the proxy is not acting in your best interests, a court-appointed guardian may be designated to act on your behalf. Living will A living will is a set of instructions documenting your wishes about medical care when you cannot express them yourself. Health care providers should keep a copy of your living will in your medical record. You may want to give a copy to family members or friends. To alert caregivers in case of an emergency, you can place a card in your wallet to let them know that you have a living will and where they can find it. A living will is used if you become:  Terminally ill.  Incapacitated.  Unable to communicate or make decisions. Items to consider in your living will include:  The use or non-use of life-sustaining equipment, such as dialysis machines and breathing machines (ventilators).  A DNR or DNAR order, which is the instruction not to use cardiopulmonary resuscitation (CPR) if breathing or heartbeat stops.  The use or non-use of tube feeding.  Withholding of food and fluids.  Comfort (palliative) care when the goal becomes comfort rather than a cure.  Organ and tissue donation. A living will does not give instructions for distributing your money and property if you should pass away. It is recommended that you seek the advice of a lawyer when  writing a will. Decisions about taxes, beneficiaries, and asset distribution will be legally binding. This process can relieve your family and friends of any concerns surrounding disputes or questions that may come up about the distribution of your assets. DNR or DNAR A DNR or DNAR order is a request not to have CPR in the event that your heart stops beating or you stop breathing. If a DNR or DNAR order has not been made and shared, a health care provider will try to help any patient whose heart has stopped or who has stopped breathing. If you plan to  have surgery, talk with your health care provider about how your DNR or DNAR order will be followed if problems occur. Summary  Advance directives are the legal documents that allow you to make choices ahead of time about your health care and medical treatment in case you become unable to communicate for yourself.  The process of discussing and writing advance directives should happen over time. You can change the advance directives, even after you have signed them.  Advance directives include DNR or DNAR orders, living wills, and designating an agent as your medical power of attorney. This information is not intended to replace advice given to you by your health care provider. Make sure you discuss any questions you have with your health care provider. Document Released: 06/27/2007 Document Revised: 04/24/2018 Document Reviewed: 02/07/2016 Elsevier Patient Education  2020 [employer].

## 2018-11-27 NOTE — Progress Notes (Signed)
MEDICARE ANNUAL WELLNESS VISIT  11/27/2018  Telephone Visit Disclaimer This Medicare AWV was conducted by telephone due to national recommendations for restrictions regarding the COVID-19 Pandemic (e.g. social distancing).  I verified, using two identifiers, that I am speaking with [name] or their authorized healthcare agent. I discussed the limitations, risks, security, and privacy concerns of performing an evaluation and management service by telephone and the potential availability of an in-person appointment in the future. The patient expressed understanding and agreed to proceed.   Subjective:  [name] is a 73 y.o. male patient of [name], [name], MD who had a Medicare Annual Wellness Visit today via telephone. [name] is Retired and lives with their spouse. he has 3 children. he reports that he is socially active and does interact with friends/family regularly. he is not physically active and enjoys fishing.  Patient Care Team: [name], [name], MD as PCP - General (Family Medicine) Satira Sark, MD as Consulting Physician (Cardiology)  Advanced Directives 11/27/2018 09/27/2017 09/14/2017 05/16/2017 05/09/2017 04/18/2017 04/18/2017  Does Patient Have a Medical Advance Directive? No No No No No No No  Would patient like information on creating a medical advance directive? Yes (MAU/Ambulatory/Procedural Areas - Information given) No - Patient declined No - Patient declined No - Patient declined No - Patient declined No - Patient declined No - Patient declined    Hospital Utilization Over the Past 12 Months: # of hospitalizations or ER visits: 1 # of surgeries: 0  Review of Systems    Patient reports that his overall health is worse compared to last year.  Patient Reported Readings (BP, Pulse, CBG, Weight, etc) none  Review of Systems: Dizziness  All other systems negative.  Pain Assessment Pain : No/denies pain     Current Medications &  Allergies (verified) Allergies as of 11/27/2018      Reactions   Carbidopa-levodopa Other (See Comments)   hallunications   Morphine Sulfate Nausea And Vomiting   Sulfa Antibiotics Nausea And Vomiting   Sulfacetamide Sodium Nausea And Vomiting      Medication List       Accurate as of November 27, 2018 10:39 AM. If you have any questions, ask your nurse or doctor.        STOP taking these medications   ciprofloxacin 500 MG tablet Commonly known as: Cipro   predniSONE 20 MG tablet Commonly known as: DELTASONE     TAKE these medications   allopurinol 300 MG tablet Commonly known as: ZYLOPRIM Take 1 tablet (300 mg total) by mouth daily.   amLODipine 10 MG tablet Commonly known as: NORVASC TAKE 1 TABLET BY MOUTH ONCE DAILY (NEEDS  TO  BE  SEEN  BEFORE  NEXT  REFILL)   carvedilol 25 MG tablet Commonly known as: COREG Take 1 tablet (25 mg total) by mouth 2 (two) times daily.   DULoxetine 60 MG capsule Commonly known as: CYMBALTA TAKE 1 CAPSULE BY MOUTH ONCE DAILY IN THE EVENING   losartan 50 MG tablet Commonly known as: COZAAR Take 1 tablet by mouth once daily   meclizine 25 MG tablet Commonly known as: ANTIVERT Take 1 tablet (25 mg total) by mouth 3 (three) times daily as needed for dizziness.   oxybutynin 10 MG 24 hr tablet Commonly known as: DITROPAN-XL Take 10 mg by mouth daily.   pantoprazole 40 MG tablet Commonly known as: PROTONIX Take 1 tablet (40 mg total) by mouth 2 (two) times daily.  traMADol 50 MG tablet Commonly known as: ULTRAM Take 1 tablet (50 mg total) by mouth every 6 (six) hours as needed.       History (reviewed): Past Medical History:  Diagnosis Date  . Aortic atherosclerosis ([place]) followed by dr Trula Slade   2004  s/p  closure Penetrating atherosclerotic ulcer of infarenal aorta w/ endovascular stent graft  . Arthritis   . CKD (chronic kidney disease), stage III ([place])   . Closed fracture of head of humerus 07/2017   right shoulder  from fall; 09-25-2017 per pt no surgical intervention, only wore sling, intermittant pain  . Coronary atherosclerosis of native coronary artery cardiologist-  dr Zigmund Gottron   positive myoview for ischemia 09-27-2009;  10-01-2009 per cardiac cath-- minimal nonobstructive CAD w/ 30% LAD   . ED (erectile dysfunction)   . Emphysema/COPD [facility])    followed by pcp--- last exacerbation 09-12-2017;  09-25-2017 per pt no cough, sob or congestion  . Essential hypertension, benign   . First degree heart block   . Frequency of urination   . GERD (gastroesophageal reflux disease)   . Gout    09-25-2017  per pt stable,  last episode 2015  . Heart murmur   . History of adenomatous polyp of colon   . History of closed head injury 2005   per pt residual resolved  . History of external beam radiation therapy    completed 10/ 2015 for prostate cancer  . History of gastric ulcer 2014  . History of kidney stones   . History of pneumothorax    spontaneous pneumo treated w/ chest tube  . History of rib fracture 07/2017   from fall,  right side 6th,7th,8th  . Neuropathy   . OAB (overactive bladder)   . OSA (obstructive sleep apnea)    Intolerant of CPAP  . Prostate cancer [facility]) urologist-  dr wrenn/  oncologist-- dr Alen Blew and dr Tammi Klippel   dx 09-26-2011 via bx-- Stage T3b,N0,  Gleason 4+4, PSA 11.2 with METs to external iliac lymph node(resolved with ADT)-- treated w/ ADT ;   05/2013 staging work-up for rising PSA , started casodex and completed radiation therapy 10/ 2015;   rising PSA post treatment  . RLS (restless legs syndrome)   . Ureteral neocystostomy bleed    left side  . Urge urinary incontinence    Past Surgical History:  Procedure Laterality Date  . ABDOMINAL AORTIC ENDOVASCULAR STENT GRAFT  09-13-2007    dr [name]  [facility]   closure penetrating atherosclerotic ulcer of infarenal aorta with stent graft  . CARDIAC CATHETERIZATION  2005   per pt normal (done in [place])  . CARDIAC  CATHETERIZATION  10/06/2009    dr cooper   minimal nonobstructive CAD w/30% LAD otherwise normal coronaries, LVEDP 76mmHg  . CARDIOVASCULAR STRESS TEST  09/27/2009    dr Aundra Dubin   lexiscan nuclear study w/ moderate reversible inferior perfusion defect ischemia,  ef 60% (cardiac cath scheduled)  . CHEST TUBE INSERTION  1989   "collapsed lung"due to injury  . CIRCUMCISION  09/26/2011   Procedure: CIRCUMCISION ADULT;  Surgeon: [name], MD;  Location: [facility];  Service: Urology;  Laterality: N/A;  . COLONOSCOPY W/ POLYPECTOMY    . COLONOSCOPY WITH PROPOFOL N/A 04/27/2016   Procedure: COLONOSCOPY WITH PROPOFOL;  Surgeon: [name], MD;  [facility];  Service: Endoscopy;  Laterality: N/A;  1115-moved to 845 per [place]inger  . CYSTOSCOPY  09/26/2011   Procedure: CYSTOSCOPY;  Surgeon: Jenny Reichmann  Keene Breath, MD;  Location: [facility];  Service: Urology;  Laterality: N/A;  . CYSTOSCOPY/RETROGRADE/URETEROSCOPY Left 09/27/2017   Procedure: CYSTOSCOPYLEFT Marlow Baars AND RENAL WASHINGS;  Surgeon: [name], MD;  Location: [facility];  Service: Urology;  Laterality: Left;  . ESOPHAGOGASTRODUODENOSCOPY (EGD) WITH PROPOFOL N/A 04/27/2016   Procedure: ESOPHAGOGASTRODUODENOSCOPY (EGD) WITH PROPOFOL;  Surgeon: [name], MD;  [facility];  Service: Endoscopy;  Laterality: N/A;  . EXTRACORPOREAL SHOCK WAVE LITHOTRIPSY  yrs ago  . FRACTURE SURGERY  child   Bilateral lower arms   . KNEE ARTHROSCOPY Right 2013  . MALONEY DILATION N/A 04/27/2016   Procedure: Venia Minks DILATION;  Surgeon: [name], MD;  [facility];  Service: Endoscopy;  Laterality: N/A;  . PARTIAL KNEE ARTHROPLASTY Right 04/26/2015   Procedure: RIGHT KNEE MEDIAL UNICOMPARTMENTAL ARTHROPLASTY;  Surgeon: Gaynelle Arabian, MD;  [facility];  Service: Orthopedics;  Laterality: Right;  . POLYPECTOMY  04/27/2016   Procedure: POLYPECTOMY;  Surgeon: [name], MD;  [facility];   Service: Endoscopy;;  cecal , ascending, descending, and sigmoid polypectomies  . PROSTATE BIOPSY  09/26/2011   Procedure: BIOPSY TRANSRECTAL ULTRASONIC PROSTATE (TUBP);  Surgeon: [name], MD;  [facility];  Service: Urology;  Laterality: N/A;     . TRANSTHORACIC ECHOCARDIOGRAM  01-07-2013   dr [name]   ef 60-65%,  grade 1 diastolic dysfunction/  mild AR without stenosis/  mild LAE/  mild to moderate calcificed MV annulus without regurg. or stenosis/  . UMBILICAL HERNIA REPAIR  yrs ago   Family History  Problem Relation Age of Onset  . Stroke Mother   . Alzheimer's disease Mother 90       probably due to head injury  . Mesothelioma Father   . Cancer Father        mesothelioma  . Hyperlipidemia Sister   . Heart attack Sister   . Cancer Brother 12  . Heart disease Brother   . Hyperlipidemia Brother   . Cancer Brother        kidney  . Healthy Daughter   . Healthy Son   . Healthy Son   . Colon cancer Neg Hx    Social History   Socioeconomic History  . Marital status: Married    Spouse name: Not on file  . Number of children: 3  . Years of education: 88  . Highest education level: Some college, no degree  Occupational History  . Occupation: Retired    Comment: Hotel manager    Comment: Part Time at [employer]  . Financial resource strain: Not hard at all  . Food insecurity    Worry: Never true    Inability: Never true  . Transportation needs    Medical: No    Non-medical: No  Tobacco Use  . Smoking status: Current Every Day Smoker    Packs/day: 0.50    Years: 42.00    Pack years: 21.00    Types: Cigarettes  . Smokeless tobacco: Never Used  Substance and Sexual Activity  . Alcohol use: No    Alcohol/week: 0.0 standard drinks  . Drug use: No  . Sexual activity: Yes    Birth control/protection: None  Lifestyle  . Physical activity    Days per week: 0 days    Minutes per session: 0 min  . Stress: Rather much  Relationships  .  Social connections    Talks on phone: More than  three times a week    Gets together: More than three times a week    Attends religious service: Never    Active member of club or organization: No    Attends meetings of clubs or organizations: Never    Relationship status: Married  Other Topics Concern  . Not on file  Social History Narrative   Patient is retired from the department of defense. He lives in a one story home with his wife. He moved from [facility] [place] about 9 years ago. They do not have family on the [place] and although he is very outgoing he feels socially isolated. He isn't a member of any groups or churches.     Activities of Daily Living In your present state of health, do you have any difficulty performing the following activities: 11/27/2018  Hearing? Y  Vision? N  Difficulty concentrating or making decisions? N  Walking or climbing stairs? N  Dressing or bathing? N  Doing errands, shopping? N  Preparing Food and eating ? N  Using the Toilet? N  In the past six months, have you accidently leaked urine? N  Do you have problems with loss of bowel control? N  Managing your Medications? N  Managing your Finances? N  Housekeeping or managing your Housekeeping? N  Some recent data might be hidden    Patient Education/ Literacy How often do you need to have someone help you when you read instructions, pamphlets, or other written materials from your doctor or pharmacy?: 1 - Never What is the last grade level you completed in school?: 12th Grade  Exercise Current Exercise Habits: The patient does not participate in regular exercise at present, Exercise limited by: None identified  Diet Patient reports consuming 2 meals a day and 1 snack(s) a day Patient reports that his primary diet is: Regular Patient reports that she does have regular access to food.   Depression Screen PHQ 2/9 Scores 11/27/2018 06/18/2018 06/11/2018 05/30/2018 04/08/2018 12/04/2017 10/18/2017  PHQ - 2  Score 0 0 2 2 4 2  0  PHQ- 9 Score - - 5 5 9 5  -     Fall Risk Fall Risk  11/27/2018 06/18/2018 06/11/2018 05/30/2018 04/08/2018  Falls in the past year? 1 1 1 1 1   Comment - - - - -  Number falls in past yr: 1 1 1 1 1   Comment - - - - -  Injury with Fall? 0 1 1 1  0  Comment - - - 2019 Broke ribs,elbow,shoulder. -  Risk Factor Category  - - - - -  Risk for fall due to : History of fall(s);Impaired balance/gait;Impaired mobility - - - -  Follow up - - - - -  Comment - - - - -     Objective:  [name] seemed alert and oriented and he participated appropriately during our telephone visit.  Blood Pressure Weight BMI  BP Readings from Last 3 Encounters:  06/18/18 138/87  06/11/18 (!) 148/97  05/30/18 132/79   Wt Readings from Last 3 Encounters:  11/27/18 273 lb 5.9 oz (124 kg)  06/18/18 273 lb 6.4 oz (124 kg)  06/11/18 277 lb 12.8 oz (126 kg)   BMI Readings from Last 1 Encounters:  11/27/18 41.57 kg/m    *Unable to obtain current vital signs, weight, and BMI due to telephone visit type  Hearing/Vision  . [name] did not seem to have difficulty with hearing/understanding during the telephone conversation . Reports that he has  not had a formal eye exam by an eye care professional within the past year . Reports that he has not had a formal hearing evaluation within the past year *Unable to fully assess hearing and vision during telephone visit type  Cognitive Function: 6CIT Screen 11/27/2018  What Year? 0 points  What month? 0 points  What time? 0 points  Count back from 20 0 points  Months in reverse 0 points  Repeat phrase 0 points  Total Score 0   (Normal:0-7, Significant for Dysfunction: >8)  Normal Cognitive Function Screening: Yes   Immunization & Health Maintenance Record Immunization History  Administered Date(s) Administered  . Influenza Whole 01/31/2007, 01/20/2008  . Influenza, High Dose Seasonal PF 01/20/2017  . Influenza,inj,Quad PF,6+ Mos  02/03/2013, 12/30/2014  . Influenza-Unspecified 01/14/2014, 12/29/2015, 01/08/2017  . Pneumococcal Conjugate-13 12/30/2014    Health Maintenance  Topic Date Due  . TETANUS/TDAP  02/06/1964  . PNA vac Low Risk Adult (2 of 2 - PPSV23) 12/30/2015  . INFLUENZA VACCINE  11/02/2018  . Hepatitis C Screening  02/26/2019 (Originally 09-04-1945)  . COLONOSCOPY  04/27/2026       Assessment  This is a routine wellness examination for [name].  Health Maintenance: Due or Overdue Health Maintenance Due  Topic Date Due  . TETANUS/TDAP  02/06/1965  . PNA vac Low Risk Adult (2 of 2 - PPSV23) 12/30/2015  . INFLUENZA VACCINE  11/02/2018    [name] does not need a referral for Community Assistance: Care Management:   no Social Work:    no Prescription Assistance:  no Nutrition/Diabetes Education:  no   Plan:  Personalized Goals Goals Addressed   None    Personalized Health Maintenance & Screening Recommendations  Pneumococcal vaccine  Influenza vaccine Td vaccine  Lung Cancer Screening Recommended: no (Low Dose CT Chest recommended if Age 2-80 years, 30 pack-year currently smoking OR have quit w/in past 15 years) Hepatitis C Screening recommended: no HIV Screening recommended: no  Advanced Directives: Written information was prepared per patient's request.  Referrals & Orders No orders of the defined types were placed in this encounter.   Follow-up Plan . Follow-up with [name], [name], MD as planned    I have personally reviewed and noted the following in the patient's chart:   . Medical and social history . Use of alcohol, tobacco or illicit drugs  . Current medications and supplements . Functional ability and status . Nutritional status . Physical activity . Advanced directives . List of other physicians . Hospitalizations, surgeries, and ER visits in previous 12 months . Vitals . Screenings to include cognitive, depression, and falls .  Referrals and appointments  In addition, I have reviewed and discussed with [name] certain preventive protocols, quality metrics, and best practice recommendations. A written personalized care plan for preventive services as well as general preventive health recommendations is available and can be mailed to the patient at his request.      Russell Heath, LPN  5/46/2703

## 2018-11-28 ENCOUNTER — Ambulatory Visit (INDEPENDENT_AMBULATORY_CARE_PROVIDER_SITE_OTHER): Payer: Medicare Other | Admitting: Otolaryngology

## 2018-11-28 DIAGNOSIS — R42 Dizziness and giddiness: Secondary | ICD-10-CM

## 2018-11-28 DIAGNOSIS — R2689 Other abnormalities of gait and mobility: Secondary | ICD-10-CM | POA: Diagnosis not present

## 2018-11-29 ENCOUNTER — Ambulatory Visit (INDEPENDENT_AMBULATORY_CARE_PROVIDER_SITE_OTHER): Payer: Medicare Other | Admitting: Urology

## 2018-11-29 DIAGNOSIS — N3281 Overactive bladder: Secondary | ICD-10-CM | POA: Diagnosis not present

## 2018-11-29 DIAGNOSIS — R351 Nocturia: Secondary | ICD-10-CM

## 2018-11-29 DIAGNOSIS — C61 Malignant neoplasm of prostate: Secondary | ICD-10-CM | POA: Diagnosis not present

## 2018-12-20 ENCOUNTER — Other Ambulatory Visit: Payer: Self-pay | Admitting: Family Medicine

## 2018-12-30 ENCOUNTER — Other Ambulatory Visit: Payer: Self-pay

## 2018-12-30 ENCOUNTER — Telehealth: Payer: Self-pay | Admitting: Family Medicine

## 2018-12-30 ENCOUNTER — Ambulatory Visit (INDEPENDENT_AMBULATORY_CARE_PROVIDER_SITE_OTHER): Payer: Medicare Other | Admitting: Family Medicine

## 2018-12-30 VITALS — BP 138/82

## 2018-12-30 DIAGNOSIS — C61 Malignant neoplasm of prostate: Secondary | ICD-10-CM

## 2018-12-30 DIAGNOSIS — N401 Enlarged prostate with lower urinary tract symptoms: Secondary | ICD-10-CM

## 2018-12-30 DIAGNOSIS — R39198 Other difficulties with micturition: Secondary | ICD-10-CM

## 2018-12-30 DIAGNOSIS — R351 Nocturia: Secondary | ICD-10-CM

## 2018-12-30 MED ORDER — TAMSULOSIN HCL 0.4 MG PO CAPS: 0.4000 mg | ORAL_CAPSULE | Freq: Every day | ORAL | 3 refills | Status: DC

## 2018-12-30 NOTE — Progress Notes (Signed)
Telephone visit  Subjective: TI:WPYK urine stream PCP: [name], [name], MD [id number] E Shimabukuro is a 73 y.o. male calls for telephone consult today. Patient provides verbal consent for consult held via phone.  Location of patient: home Location of provider: Working remotely from home Others present for call: none  1.  Slow urinary stream Patient reports ongoing slow urinary stream.  He has a medical history significant for prostate cancer, treated with Lupron.  He has follow-up with his urologist at the end of the month but discussed with his brother-in-law, who is a doctor, his symptoms.  His family member recommended consideration for Flomax and he would like to try this.  He reports nocturia.  Denies any dysuria, hematuria, fevers, abdominal pain, nausea or vomiting.  Allergy list is significant for allergy to sulfa but patient notes that he had done well with sulfas in the past until he was given a shot on the same day that he was given sulfa which subsequently resulted in nausea and vomiting.  He is never had anaphylaxis to sulfa or rash.   ROS: Per HPI  Allergies  Allergen Reactions  . Carbidopa-Levodopa Other (See Comments)    hallunications  . Morphine Sulfate Nausea And Vomiting  . Sulfa Antibiotics Nausea And Vomiting  . Sulfacetamide Sodium Nausea And Vomiting   Past Medical History:  Diagnosis Date  . Aortic atherosclerosis ([place]) followed by dr Trula Slade   2004  s/p  closure Penetrating atherosclerotic ulcer of infarenal aorta w/ endovascular stent graft  . Arthritis   . CKD (chronic kidney disease), stage III ([place])   . Closed fracture of head of humerus 07/2017   right shoulder from fall; 09-25-2017 per pt no surgical intervention, only wore sling, intermittant pain  . Coronary atherosclerosis of native coronary artery cardiologist-  dr Zigmund Gottron   positive myoview for ischemia 09-27-2009;  10-01-2009 per cardiac cath-- minimal nonobstructive CAD w/ 30% LAD   . ED  (erectile dysfunction)   . Emphysema/COPD [facility])    followed by pcp--- last exacerbation 09-12-2017;  09-25-2017 per pt no cough, sob or congestion  . Essential hypertension, benign   . First degree heart block   . Frequency of urination   . GERD (gastroesophageal reflux disease)   . Gout    09-25-2017  per pt stable,  last episode 2015  . Heart murmur   . History of adenomatous polyp of colon   . History of closed head injury 2005   per pt residual resolved  . History of external beam radiation therapy    completed 10/ 2015 for prostate cancer  . History of gastric ulcer 2014  . History of kidney stones   . History of pneumothorax    spontaneous pneumo treated w/ chest tube  . History of rib fracture 07/2017   from fall,  right side 6th,7th,8th  . Neuropathy   . OAB (overactive bladder)   . OSA (obstructive sleep apnea)    Intolerant of CPAP  . Prostate cancer [facility]) urologist-  dr wrenn/  oncologist-- dr Alen Blew and dr Tammi Klippel   dx 09-26-2011 via bx-- Stage T3b,N0,  Gleason 4+4, PSA 11.2 with METs to external iliac lymph node(resolved with ADT)-- treated w/ ADT ;   05/2013 staging work-up for rising PSA , started casodex and completed radiation therapy 10/ 2015;   rising PSA post treatment  . RLS (restless legs syndrome)   . Ureteral neocystostomy bleed    left side  . Urge urinary incontinence  Current Outpatient Medications:  .  allopurinol (ZYLOPRIM) 300 MG tablet, Take 1 tablet (300 mg total) by mouth daily., Disp: 90 tablet, Rfl: 1 .  amLODipine (NORVASC) 10 MG tablet, TAKE 1 TABLET BY MOUTH ONCE DAILY (NEEDS  TO  BE  SEEN  BEFORE  NEXT  REFILL), Disp: 90 tablet, Rfl: 0 .  carvedilol (COREG) 25 MG tablet, Take 1 tablet (25 mg total) by mouth 2 (two) times daily., Disp: 180 tablet, Rfl: 1 .  DULoxetine (CYMBALTA) 60 MG capsule, TAKE 1 CAPSULE BY MOUTH ONCE DAILY IN THE EVENING, Disp: 90 capsule, Rfl: 1 .  losartan (COZAAR) 50 MG tablet, Take 1 tablet by mouth once daily,  Disp: 90 tablet, Rfl: 0 .  meclizine (ANTIVERT) 25 MG tablet, Take 1 tablet (25 mg total) by mouth 3 (three) times daily as needed for dizziness., Disp: 60 tablet, Rfl: 3 .  oxybutynin (DITROPAN-XL) 10 MG 24 hr tablet, Take 10 mg by mouth daily., Disp: , Rfl:  .  pantoprazole (PROTONIX) 40 MG tablet, Take 1 tablet (40 mg total) by mouth 2 (two) times daily., Disp: 180 tablet, Rfl: 1 .  traMADol (ULTRAM) 50 MG tablet, Take 1 tablet (50 mg total) by mouth every 6 (six) hours as needed., Disp: 28 tablet, Rfl: 0  Assessment/ Plan: 73 y.o. male   1. BPH associated with nocturia Does not sound infectious.  More likely that the slow urinary stream is related to BPH.  We discussed trial of Flomax.  Per his report during today's visit he never truly had a sulfa allergy and thinks that the nausea and vomiting was more related to the shot that was given to him that day.  His wife is a Marine scientist and his brother-in-law is a doctor and so he feels safe with proceeding.  I reviewed his last renal function which demonstrated reduced renal function but still within range for treatment with Flomax.  He is to keep his follow-up visit with his urologist as scheduled.  He will follow-up PRN - tamsulosin (FLOMAX) 0.4 MG CAPS capsule; Take 1 capsule (0.4 mg total) by mouth daily.  Dispense: 30 capsule; Refill: 3  2. Prostate cancer ([place]) - tamsulosin (FLOMAX) 0.4 MG CAPS capsule; Take 1 capsule (0.4 mg total) by mouth daily.  Dispense: 30 capsule; Refill: 3  3. Slow urinary stream - tamsulosin (FLOMAX) 0.4 MG CAPS capsule; Take 1 capsule (0.4 mg total) by mouth daily.  Dispense: 30 capsule; Refill: 3   Start time: 10:44am End time: 10:49am  Total time spent on patient care (including telephone call/ virtual visit): 10 minutes  [place], [place] [phone]

## 2018-12-30 NOTE — Telephone Encounter (Signed)
Apt scheduled with provider for today.

## 2018-12-30 NOTE — Patient Instructions (Signed)

## 2019-02-04 ENCOUNTER — Other Ambulatory Visit: Payer: Self-pay

## 2019-02-05 ENCOUNTER — Ambulatory Visit (INDEPENDENT_AMBULATORY_CARE_PROVIDER_SITE_OTHER): Payer: Medicare Other

## 2019-02-05 DIAGNOSIS — Z23 Encounter for immunization: Secondary | ICD-10-CM

## 2019-02-06 ENCOUNTER — Ambulatory Visit (INDEPENDENT_AMBULATORY_CARE_PROVIDER_SITE_OTHER): Payer: Medicare Other | Admitting: Family Medicine

## 2019-02-06 ENCOUNTER — Encounter: Payer: Self-pay | Admitting: Family Medicine

## 2019-02-06 DIAGNOSIS — I10 Essential (primary) hypertension: Secondary | ICD-10-CM | POA: Diagnosis not present

## 2019-02-06 DIAGNOSIS — E782 Mixed hyperlipidemia: Secondary | ICD-10-CM | POA: Diagnosis not present

## 2019-02-06 DIAGNOSIS — R7303 Prediabetes: Secondary | ICD-10-CM | POA: Diagnosis not present

## 2019-02-06 DIAGNOSIS — G629 Polyneuropathy, unspecified: Secondary | ICD-10-CM

## 2019-02-06 MED ORDER — LOSARTAN POTASSIUM 50 MG PO TABS: 50.0000 mg | ORAL_TABLET | Freq: Every day | ORAL | 3 refills | Status: DC

## 2019-02-06 MED ORDER — CARVEDILOL 25 MG PO TABS: 25.0000 mg | ORAL_TABLET | Freq: Two times a day (BID) | ORAL | 3 refills | Status: DC

## 2019-02-06 MED ORDER — AMLODIPINE BESYLATE 10 MG PO TABS: 10.0000 mg | ORAL_TABLET | Freq: Every day | ORAL | 3 refills | Status: DC

## 2019-02-06 MED ORDER — GABAPENTIN 100 MG PO CAPS: 100.0000 mg | ORAL_CAPSULE | Freq: Three times a day (TID) | ORAL | 3 refills | Status: DC

## 2019-02-06 MED ORDER — ALLOPURINOL 300 MG PO TABS: 300.0000 mg | ORAL_TABLET | Freq: Every day | ORAL | 3 refills | Status: DC

## 2019-02-06 NOTE — Progress Notes (Signed)
Virtual Visit via telephone Note  I connected with [name] on 02/06/19 at [phone] by telephone and verified that I am speaking with the correct person using two identifiers. [name] is currently located at home and no other people are currently with her during visit. The provider, [name] Jacari Iannello, MD is located in their office at time of visit.  Call ended at 1005  I discussed the limitations, risks, security and privacy concerns of performing an evaluation and management service by telephone and the availability of in person appointments. I also discussed with the patient that there may be a patient responsible charge related to this service. The patient expressed understanding and agreed to proceed.   History and Present Illness: Prediabetes Patient comes in today for recheck of his diabetes. Patient has been currently taking no medication currently, he does not monitor at home and we do not have recent blood work because of the Covid and his blood sugar. Patient is currently on an ACE inhibitor/ARB. Patient has not seen an ophthalmologist this year. Patient is calling in for neuropathy in his feet and his feet are on fire and numb.   Hypertension Patient is currently on amlodipine and carvedilol and losartan, and their blood pressure today is 140/90 yesterday. Patient denies any lightheadedness or dizziness. Patient denies headaches, blurred vision, chest pains, shortness of breath, or weakness. Denies any side effects from medication and is content with current medication.   Hyperlipidemia Patient is coming in for recheck of his hyperlipidemia. The patient is currently taking no medication currently but will discuss more extensively at next visit. They deny any issues with myalgias or history of liver damage from it. They deny any focal numbness or weakness or chest pain.   No diagnosis found.  Outpatient Encounter Medications as of 02/06/2019  Medication Sig  .  allopurinol (ZYLOPRIM) 300 MG tablet Take 1 tablet (300 mg total) by mouth daily.  [name] amLODipine (NORVASC) 10 MG tablet TAKE 1 TABLET BY MOUTH ONCE DAILY (NEEDS  TO  BE  SEEN  BEFORE  NEXT  REFILL)  . carvedilol (COREG) 25 MG tablet Take 1 tablet (25 mg total) by mouth 2 (two) times daily.  . DULoxetine (CYMBALTA) 60 MG capsule TAKE 1 CAPSULE BY MOUTH ONCE DAILY IN THE EVENING  . losartan (COZAAR) 50 MG tablet Take 1 tablet by mouth once daily  . meclizine (ANTIVERT) 25 MG tablet Take 1 tablet (25 mg total) by mouth 3 (three) times daily as needed for dizziness.  [name] oxybutynin (DITROPAN-XL) 10 MG 24 hr tablet Take 10 mg by mouth daily.  . pantoprazole (PROTONIX) 40 MG tablet Take 1 tablet (40 mg total) by mouth 2 (two) times daily.  . tamsulosin (FLOMAX) 0.4 MG CAPS capsule Take 1 capsule (0.4 mg total) by mouth daily.  . traMADol (ULTRAM) 50 MG tablet Take 1 tablet (50 mg total) by mouth every 6 (six) hours as needed.   No facility-administered encounter medications on file as of 02/06/2019.     Review of Systems  Constitutional: Negative for chills and fever.  Respiratory: Negative for shortness of breath and wheezing.   Cardiovascular: Negative for chest pain and leg swelling.  Gastrointestinal: Negative for abdominal pain.  Musculoskeletal: Negative for back pain and gait problem.  Skin: Negative for rash.  Neurological: Positive for numbness. Negative for dizziness, weakness and light-headedness.  All other systems reviewed and are negative.   Observations/Objective: Patient sounds comfortable and in no acute distress  Assessment and Plan: Problem List Items Addressed This Visit      Cardiovascular and Mediastinum   Essential hypertension, benign   Relevant Medications   losartan (COZAAR) 50 MG tablet   carvedilol (COREG) 25 MG tablet   amLODipine (NORVASC) 10 MG tablet     Nervous and Auditory   Neuropathy - Primary   Relevant Medications   gabapentin (NEURONTIN) 100 MG  capsule     Other   Hyperlipidemia   Relevant Medications   losartan (COZAAR) 50 MG tablet   carvedilol (COREG) 25 MG tablet   amLODipine (NORVASC) 10 MG tablet   Prediabetes   Relevant Medications   gabapentin (NEURONTIN) 100 MG capsule       Follow Up Instructions: Patient says his feet have been burning and causing a lot more pain where sometimes he cannot walk because of the numbness and burning in both of his feet.  He says that they feel cold but then he touches them and they are warm but they feel cold.  We will start gabapentin to see if it helps with this.  Follow-up in 3 months for diabetes and neuropathy   I discussed the assessment and treatment plan with the patient. The patient was provided an opportunity to ask questions and all were answered. The patient agreed with the plan and demonstrated an understanding of the instructions.   The patient was advised to call back or seek an in-person evaluation if the symptoms worsen or if the condition fails to improve as anticipated.  The above assessment and management plan was discussed with the patient. The patient verbalized understanding of and has agreed to the management plan. Patient is aware to call the clinic if symptoms persist or worsen. Patient is aware when to return to the clinic for a follow-up visit. Patient educated on when it is appropriate to go to the emergency department.    I provided 13 minutes of non-face-to-face time during this encounter.    Worthy Rancher, MD

## 2019-03-04 ENCOUNTER — Other Ambulatory Visit: Payer: Self-pay

## 2019-03-04 ENCOUNTER — Encounter: Payer: Self-pay | Admitting: *Deleted

## 2019-03-04 ENCOUNTER — Encounter: Payer: Self-pay | Admitting: Gastroenterology

## 2019-03-04 ENCOUNTER — Other Ambulatory Visit: Payer: Self-pay | Admitting: *Deleted

## 2019-03-04 ENCOUNTER — Ambulatory Visit (INDEPENDENT_AMBULATORY_CARE_PROVIDER_SITE_OTHER): Payer: Medicare Other | Admitting: Gastroenterology

## 2019-03-04 VITALS — BP 123/85 | HR 75 | Temp 96.6°F | Ht 68.0 in | Wt 273.2 lb

## 2019-03-04 DIAGNOSIS — Z86010 Personal history of colonic polyps: Secondary | ICD-10-CM

## 2019-03-04 DIAGNOSIS — K5900 Constipation, unspecified: Secondary | ICD-10-CM | POA: Insufficient documentation

## 2019-03-04 DIAGNOSIS — R1310 Dysphagia, unspecified: Secondary | ICD-10-CM

## 2019-03-04 DIAGNOSIS — C61 Malignant neoplasm of prostate: Secondary | ICD-10-CM | POA: Diagnosis not present

## 2019-03-04 NOTE — Progress Notes (Addendum)
Referring Provider: [name], [name], MD Primary Care Physician:  [name], [name], MD Primary GI: Dr. Gala Romney   Chief Complaint  Patient presents with  . Dysphagia    food gets stuck in chest    HPI:   [name] is a 73 y.o. male presenting today with a history of dysphagia, with last EGD in 2018 s/p empiric dilation. Improvement in dilation last time. Due to multiple adenomas, colonoscopy due in 2021.   Feels like food sticks in upper abdomen. Had to regurgitate food last night. [place] got lodged. Small amount came up. Soft foods without any issues. Notes epigastric discomfort intermittently for the past 3 weeks. Taking smaller bites and chewing well without much help. No nausea. GERD controlled with Protonix. No hematochezia or melena. Will take an Advil about twice a month for headaches.   Will sometimes skip a day for BM then go several times the next day.    Past Medical History:  Diagnosis Date  . 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 Arthritis   . CKD (chronic kidney disease), stage III   . Closed fracture of head of humerus 07/2017   right shoulder from fall; 09-25-2017 per pt no surgical intervention, only wore sling, intermittant pain  . Coronary atherosclerosis of native coronary artery cardiologist-  dr Zigmund Gottron   positive myoview for ischemia 09-27-2009;  10-01-2009 per cardiac cath-- minimal nonobstructive CAD w/ 30% LAD   . ED (erectile dysfunction)   . Emphysema/COPD [facility])    followed by pcp--- last exacerbation 09-12-2017;  09-25-2017 per pt no cough, sob or congestion  . Essential hypertension, benign   . First degree heart block   . Frequency of urination   . GERD (gastroesophageal reflux disease)   . Gout    09-25-2017  per pt stable,  last episode 2015  . Heart murmur   . History of adenomatous polyp of colon   .  History of closed head injury 2005   per pt residual resolved  . History of external beam radiation therapy    completed 10/ 2015 for prostate cancer  . History of gastric ulcer 2014  . History of kidney stones   . History of pneumothorax    spontaneous pneumo treated w/ chest tube  . History of rib fracture 07/2017   from fall,  right side 6th,7th,8th  . Neuropathy   . OAB (overactive bladder)   . OSA (obstructive sleep apnea)    Intolerant of CPAP  . Prostate cancer [facility]) urologist-  dr wrenn/  oncologist-- dr Alen Blew and dr Tammi Klippel   dx 09-26-2011 via bx-- Stage T3b,N0,  Gleason 4+4, PSA 11.2 with METs to external iliac lymph node(resolved with ADT)-- treated w/ ADT ;   05/2013 staging work-up for rising PSA , started casodex and completed radiation therapy 10/ 2015;   rising PSA post treatment  . RLS (restless legs syndrome)   . Ureteral neocystostomy bleed    left side  . Urge urinary incontinence     Past Surgical History:  Procedure Laterality Date  . ABDOMINAL AORTIC ENDOVASCULAR STENT GRAFT  09-13-2007    dr [name]  [facility]   closure penetrating atherosclerotic ulcer of infarenal aorta with stent graft  . CARDIAC CATHETERIZATION  2005   per pt normal (done in [place])  . CARDIAC CATHETERIZATION  10/06/2009    dr Burt Knack  minimal nonobstructive CAD w/30% LAD otherwise normal coronaries, LVEDP 109mmHg  . CARDIOVASCULAR STRESS TEST  09/27/2009    dr Aundra Dubin   lexiscan nuclear study w/ moderate reversible inferior perfusion defect ischemia,  ef 60% (cardiac cath scheduled)  . CHEST TUBE INSERTION  1989   "collapsed lung"due to injury  . CIRCUMCISION  09/26/2011   Procedure: CIRCUMCISION ADULT;  Surgeon: [name], MD;  Location: [facility];  Service: Urology;  Laterality: N/A;  . COLONOSCOPY W/ POLYPECTOMY    . COLONOSCOPY WITH PROPOFOL N/A 04/27/2016    eight 4-8 mm polyps in descending colon, at splenic flexure, in ascending colon and cecum. Tubular adenomas. Surveillance in 3  years.  . CYSTOSCOPY  09/26/2011   Procedure: CYSTOSCOPY;  Surgeon: [name], MD;  Location: [facility];  Service: Urology;  Laterality: N/A;  . CYSTOSCOPY/RETROGRADE/URETEROSCOPY Left 09/27/2017   Procedure: CYSTOSCOPYLEFT Marlow Baars AND RENAL WASHINGS;  Surgeon: [name], MD;  Location: [facility];  Service: Urology;  Laterality: Left;  . ESOPHAGOGASTRODUODENOSCOPY (EGD) WITH PROPOFOL N/A 04/27/2016   normal esophagus s/p dilation, small hiatal hernia, normal duodenum  . EXTRACORPOREAL SHOCK WAVE LITHOTRIPSY  yrs ago  . FRACTURE SURGERY  child   Bilateral lower arms   . KNEE ARTHROSCOPY Right 2013  . MALONEY DILATION N/A 04/27/2016   Procedure: Venia Minks DILATION;  Surgeon: [name], MD;  Location: [facility];  Service: Endoscopy;  Laterality: N/A;  . PARTIAL KNEE ARTHROPLASTY Right 04/26/2015   Procedure: RIGHT KNEE MEDIAL UNICOMPARTMENTAL ARTHROPLASTY;  Surgeon: Gaynelle Arabian, MD;  Location: [facility];  Service: Orthopedics;  Laterality: Right;  . POLYPECTOMY  04/27/2016   Procedure: POLYPECTOMY;  Surgeon: [name], MD;  Location: [facility];  Service: Endoscopy;;  cecal , ascending, descending, and sigmoid polypectomies  . PROSTATE BIOPSY  09/26/2011   Procedure: BIOPSY TRANSRECTAL ULTRASONIC PROSTATE (TUBP);  Surgeon: [name], MD;  Location: [facility];  Service: Urology;  Laterality: N/A;     . TRANSTHORACIC ECHOCARDIOGRAM  01-07-2013   dr [name]   ef 60-65%,  grade 1 diastolic dysfunction/  mild AR without stenosis/  mild LAE/  mild to moderate calcificed MV annulus without regurg. or stenosis/  . UMBILICAL HERNIA REPAIR  yrs ago    Current Outpatient Medications  Medication Sig Dispense Refill  . allopurinol (ZYLOPRIM) 300 MG tablet Take 1 tablet (300 mg total) by mouth daily. 90 tablet 3  . amLODipine (NORVASC) 10 MG tablet Take 1 tablet (10 mg total) by mouth daily. TAKE 1 TABLET BY MOUTH ONCE DAILY (NEEDS  TO  BE  SEEN  BEFORE  NEXT   REFILL) 90 tablet 3  . carvedilol (COREG) 25 MG tablet Take 1 tablet (25 mg total) by mouth 2 (two) times daily. 180 tablet 3  . DULoxetine (CYMBALTA) 60 MG capsule TAKE 1 CAPSULE BY MOUTH ONCE DAILY IN THE EVENING 90 capsule 1  . gabapentin (NEURONTIN) 100 MG capsule Take 1 capsule (100 mg total) by mouth 3 (three) times daily. (Patient taking differently: Take 100 mg by mouth daily. ) 90 capsule 3  . losartan (COZAAR) 50 MG tablet Take 1 tablet (50 mg total) by mouth daily. 90 tablet 3  . pantoprazole (PROTONIX) 40 MG tablet Take 1 tablet (40 mg total) by mouth 2 (two) times daily. 180 tablet 1  . tamsulosin (FLOMAX) 0.4 MG CAPS capsule Take 1 capsule (0.4 mg total) by mouth daily. 30 capsule 3   No current facility-administered medications for this  visit.     Allergies as of 03/04/2019 - Review Complete 03/04/2019  Allergen Reaction Noted  . Carbidopa-levodopa Other (See Comments) 01/14/2013  . Morphine sulfate Nausea And Vomiting 09/28/2008  . Sulfa antibiotics Nausea And Vomiting 01/31/2007  . Sulfacetamide sodium Nausea And Vomiting 01/31/2007    Family History  Problem Relation Age of Onset  . Stroke Mother   . Alzheimer's disease Mother 84       probably due to head injury  . Mesothelioma Father   . Cancer Father        mesothelioma  . Hyperlipidemia Sister   . Heart attack Sister   . Cancer Brother 19  . Heart disease Brother   . Hyperlipidemia Brother   . Cancer Brother        kidney  . Healthy Daughter   . Healthy Son   . Healthy Son   . Colon cancer Neg Hx     Social History   Socioeconomic History  . Marital status: Married    Spouse name: Not on file  . Number of children: 3  . Years of education: 48  . Highest education level: Some college, no degree  Occupational History  . Occupation: Retired    Comment: Hotel manager    Comment: Part Time at [employer]  . Financial resource strain: Not hard at all  . Food insecurity    Worry:  Never true    Inability: Never true  . Transportation needs    Medical: No    Non-medical: No  Tobacco Use  . Smoking status: Current Every Day Smoker    Packs/day: 0.50    Years: 42.00    Pack years: 21.00    Types: Cigarettes  . Smokeless tobacco: Never Used  Substance and Sexual Activity  . Alcohol use: No    Alcohol/week: 0.0 standard drinks  . Drug use: No  . Sexual activity: Yes    Birth control/protection: None  Lifestyle  . Physical activity    Days per week: 0 days    Minutes per session: 0 min  . Stress: Rather much  Relationships  . Social connections    Talks on phone: More than three times a week    Gets together: More than three times a week    Attends religious service: Never    Active member of club or organization: No    Attends meetings of clubs or organizations: Never    Relationship status: Married  Other Topics Concern  . Not on file  Social History Narrative   Patient is retired from the [employer]. He lives in a one story home with his wife. He moved from [facility] about 9 years ago. They do not have family on the [place] and although he is very outgoing he feels socially isolated. He isn't a member of any groups or churches.     Review of Systems: Gen: Denies fever, chills, anorexia. Denies fatigue, weakness, weight loss.  CV: Denies chest pain, palpitations, syncope, peripheral edema, and claudication. Resp: dyspnea on exertion  GI: see HPI Derm: Denies rash, itching, dry skin Psych: +depression Heme: Denies bruising, bleeding, and enlarged lymph nodes.  Physical Exam: BP 123/85   Pulse 75   Temp (!) 96.6 F (35.9 C) (Temporal)   Ht 5\' 8"  (1.727 m)   Wt 273 lb 3.2 oz (123.9 kg)   BMI 41.54 kg/m  General:   Alert and oriented. No distress noted. Pleasant and  cooperative.  Head:  Normocephalic and atraumatic. Lungs: clear bilaterally Cardiac: S1 S2 present with soft systolic murmur  Abdomen:  +BS, soft, obese,  No rebound  or guarding. No HSM or masses noted. Msk:  Symmetrical without gross deformities. Normal posture. Extremities:  Without edema. Neurologic:  Alert and  oriented x4 Psych:  Alert and cooperative. Normal mood and affect.  ASSESSMENT: [name] is a 73 y.o. male presenting today with recurrent solid food dysphagia for the past 3 weeks, mild epigastric discomfort, with last dilation almost 3 years ago and improvement with empiric dilation at that time. Discussed avoidance of tough textures, chewing well, soft bites. Will pursue EGD/dilation in near future.  Constipation: mild. Add Benefiber daily  History of adenomas: due in 2021. Will circle back around to this in a more elective setting after UGI concerns addressed.    PLAN:  Proceed with upper endoscopy/dilation in the near future with Dr. Gala Romney. The risks, benefits, and alternatives have been discussed in detail with patient. They have stated understanding and desire to proceed. PROPOFOL due to polypharmacy  Continue Protonix daily  Benefiber daily  Return in 3-4 months to arrange surveillance colonoscopy   Annitta Needs, PhD, ANP-BC [facility]     03/06/2019 patient seen and short stay.  No change.  EGD with esophageal dilation today per plan.  The risks, benefits, limitations, alternatives and imponderables have been reviewed with the patient. Potential for esophageal dilation, biopsy, etc. have also been reviewed.  Questions have been answered. All parties agreeable.

## 2019-03-04 NOTE — Patient Instructions (Signed)
We are arranging an upper endoscopy with dilation in the near future.  For constipation: start taking Benefiber 2 teaspoons daily and increase as tolerated.   We will see you in 4 months and discuss colonoscopy at that point!  It was a pleasure to see you today. I want to create trusting relationships with patients to provide genuine, compassionate, and quality care. I value your feedback. If you receive a survey regarding your visit,  I greatly appreciate you taking time to fill this out.   Annitta Needs, PhD, ANP-BC [facility]

## 2019-03-05 ENCOUNTER — Encounter (HOSPITAL_COMMUNITY)
Admission: RE | Admit: 2019-03-05 | Discharge: 2019-03-05 | Disposition: A | Discharge status: Home / Self Care | Payer: Medicare Other | Source: Ambulatory Visit | Attending: Internal Medicine | Admitting: Internal Medicine

## 2019-03-05 ENCOUNTER — Other Ambulatory Visit: Payer: Self-pay

## 2019-03-05 ENCOUNTER — Other Ambulatory Visit (HOSPITAL_COMMUNITY)
Admission: RE | Admit: 2019-03-05 | Discharge: 2019-03-05 | Disposition: A | Discharge status: Home / Self Care | Payer: Medicare Other | Source: Ambulatory Visit | Attending: Internal Medicine | Admitting: Internal Medicine

## 2019-03-05 ENCOUNTER — Encounter (HOSPITAL_COMMUNITY): Payer: Self-pay

## 2019-03-05 DIAGNOSIS — Z20828 Contact with and (suspected) exposure to other viral communicable diseases: Secondary | ICD-10-CM | POA: Diagnosis not present

## 2019-03-05 DIAGNOSIS — Z01812 Encounter for preprocedural laboratory examination: Secondary | ICD-10-CM | POA: Insufficient documentation

## 2019-03-05 LAB — BASIC METABOLIC PANEL
Anion gap: 9 (ref 5–15)
BUN: 28 mg/dL — ABNORMAL HIGH (ref 8–23)
CO2: 23 mmol/L (ref 22–32)
Calcium: 8.6 mg/dL — ABNORMAL LOW (ref 8.9–10.3)
Chloride: 107 mmol/L (ref 98–111)
Creatinine, Ser: 2 mg/dL — ABNORMAL HIGH (ref 0.61–1.24)
GFR calc Af Amer: 37 mL/min — ABNORMAL LOW (ref >60)
GFR calc non Af Amer: 32 mL/min — ABNORMAL LOW (ref >60)
Glucose, Bld: 118 mg/dL — ABNORMAL HIGH (ref 70–99)
Potassium: 3.5 mmol/L (ref 3.5–5.1)
Sodium: 139 mmol/L (ref 135–145)

## 2019-03-05 LAB — SARS CORONAVIRUS 2 (TAT 6-24 HRS): SARS Coronavirus 2: NEGATIVE

## 2019-03-05 NOTE — Patient Instructions (Signed)
[name]  03/05/2019     @PREFPERIOPPHARMACY @   Your procedure is scheduled on  03/06/2019 .  Report to [facility] at 1:00   P.M.  Call this number if you have problems the morning of surgery:  [phone]   Remember:   Follow the diet and prep instructions given to you by Dr Roseanne Kaufman office.                      Take these medicines the morning of surgery with A SIP OF WATER  Allopurinol, amlodipine, carvedilol, cymbalta, protonix, detrol LA.    Do not wear jewelry, make-up or nail polish.  Do not wear lotions, powders, or perfumes. Please wear deodorant and brush your teeth.  Do not shave 48 hours prior to surgery.  Men may shave face and neck.  Do not bring valuables to the hospital.  [facility] is not responsible for any belongings or valuables.  Contacts, dentures or bridgework may not be worn into surgery.  Leave your suitcase in the car.  After surgery it may be brought to your room.  For patients admitted to the hospital, discharge time will be determined by your treatment team.  Patients discharged the day of surgery will not be allowed to drive home.   Name and phone number of your driver:   family Special instructions:  None  Please read over the following fact sheets that you were given. Anesthesia Post-op Instructions and Care and Recovery After Surgery       Upper Endoscopy, Adult, Care After This sheet gives you information about how to care for yourself after your procedure. Your health care provider may also give you more specific instructions. If you have problems or questions, contact your health care provider. What can I expect after the procedure? After the procedure, it is common to have:  A sore throat.  Mild stomach pain or discomfort.  Bloating.  Nausea. Follow these instructions at home:   Follow instructions from your health care provider about what to eat or drink after your procedure.  Return to your normal  activities as told by your health care provider. Ask your health care provider what activities are safe for you.  Take over-the-counter and prescription medicines only as told by your health care provider.  Do not drive for 24 hours if you were given a sedative during your procedure.  Keep all follow-up visits as told by your health care provider. This is important. Contact a health care provider if you have:  A sore throat that lasts longer than one day.  Trouble swallowing. Get help right away if:  You vomit blood or your vomit looks like coffee grounds.  You have: ? A fever. ? Bloody, black, or tarry stools. ? A severe sore throat or you cannot swallow. ? Difficulty breathing. ? Severe pain in your chest or abdomen. Summary  After the procedure, it is common to have a sore throat, mild stomach discomfort, bloating, and nausea.  Do not drive for 24 hours if you were given a sedative during the procedure.  Follow instructions from your health care provider about what to eat or drink after your procedure.  Return to your normal activities as told by your health care provider. This information is not intended to replace advice given to you by your health care provider. Make sure you discuss any questions you have with your health care provider. Document  Released: 09/19/2011 Document Revised: 09/11/2017 Document Reviewed: 08/20/2017 Elsevier Patient Education  [employer].  Esophageal Dilatation Esophageal dilatation, also called esophageal dilation, is a procedure to widen or open (dilate) a blocked or narrowed part of the esophagus. The esophagus is the part of the body that moves food and liquid from the mouth to the stomach. You may need this procedure if:  You have a buildup of scar tissue in your esophagus that makes it difficult, painful, or impossible to swallow. This can be caused by gastroesophageal reflux disease (GERD).  You have cancer of the esophagus.   There is a problem with how food moves through your esophagus. In some cases, you may need this procedure repeated at a later time to dilate the esophagus gradually. Tell a health care provider about:  Any allergies you have.  All medicines you are taking, including vitamins, herbs, eye drops, creams, and over-the-counter medicines.  Any problems you or family members have had with anesthetic medicines.  Any blood disorders you have.  Any surgeries you have had.  Any medical conditions you have.  Any antibiotic medicines you are required to take before dental procedures.  Whether you are pregnant or may be pregnant. What are the risks? Generally, this is a safe procedure. However, problems may occur, including:  Bleeding due to a tear in the lining of the esophagus.  A hole (perforation) in the esophagus. What happens before the procedure?  Follow instructions from your health care provider about eating or drinking restrictions.  Ask your health care provider about changing or stopping your regular medicines. This is especially important if you are taking diabetes medicines or blood thinners.  Plan to have someone take you home from the hospital or clinic.  Plan to have a responsible adult care for you for at least 24 hours after you leave the hospital or clinic. This is important. What happens during the procedure?  You may be given a medicine to help you relax (sedative).  A numbing medicine may be sprayed into the back of your throat, or you may gargle the medicine.  Your health care provider may perform the dilatation using various surgical instruments, such as: ? Simple dilators. This instrument is carefully placed in the esophagus to stretch it. ? Guided wire bougies. This involves using an endoscope to insert a wire into the esophagus. A dilator is passed over this wire to enlarge the esophagus. Then the wire is removed. ? Balloon dilators. An endoscope with a small  balloon at the end is inserted into the esophagus. The balloon is inflated to stretch the esophagus and open it up. The procedure may vary among health care providers and hospitals. What happens after the procedure?  Your blood pressure, heart rate, breathing rate, and blood oxygen level will be monitored until the medicines you were given have worn off.  Your throat may feel slightly sore and numb. This will improve slowly over time.  You will not be allowed to eat or drink until your throat is no longer numb.  When you are able to drink, urinate, and sit on the edge of the bed without nausea or dizziness, you may be able to return home. Follow these instructions at home:  Take over-the-counter and prescription medicines only as told by your health care provider.  Do not drive for 24 hours if you were given a sedative during your procedure.  You should have a responsible adult with you for 24 hours after the procedure.  Follow instructions from your health care provider about any eating or drinking restrictions.  Do not use any products that contain nicotine or tobacco, such as cigarettes and e-cigarettes. If you need help quitting, ask your health care provider.  Keep all follow-up visits as told by your health care provider. This is important. Get help right away if you:  Have a fever.  Have chest pain.  Have pain that is not relieved by medication.  Have trouble breathing.  Have trouble swallowing.  Vomit blood. Summary  Esophageal dilatation, also called esophageal dilation, is a procedure to widen or open (dilate) a blocked or narrowed part of the esophagus.  Plan to have someone take you home from the hospital or clinic.  For this procedure, a numbing medicine may be sprayed into the back of your throat, or you may gargle the medicine.  Do not drive for 24 hours if you were given a sedative during your procedure. This information is not intended to replace advice  given to you by your health care provider. Make sure you discuss any questions you have with your health care provider. Document Released: 05/11/2005 Document Revised: 03/02/2017 Document Reviewed: 01/23/2017 Elsevier Patient Education  2020 [place] After These instructions provide you with information about caring for yourself after your procedure. Your health care provider may also give you more specific instructions. Your treatment has been planned according to current medical practices, but problems sometimes occur. Call your health care provider if you have any problems or questions after your procedure. What can I expect after the procedure? After your procedure, you may:  Feel sleepy for several hours.  Feel clumsy and have poor balance for several hours.  Feel forgetful about what happened after the procedure.  Have poor judgment for several hours.  Feel nauseous or vomit.  Have a sore throat if you had a breathing tube during the procedure. Follow these instructions at home: For at least 24 hours after the procedure:      Have a responsible adult stay with you. It is important to have someone help care for you until you are awake and alert.  Rest as needed.  Do not: ? Participate in activities in which you could fall or become injured. ? Drive. ? Use heavy machinery. ? Drink alcohol. ? Take sleeping pills or medicines that cause drowsiness. ? Make important decisions or sign legal documents. ? Take care of children on your own. Eating and drinking  Follow the diet that is recommended by your health care provider.  If you vomit, drink water, juice, or soup when you can drink without vomiting.  Make sure you have little or no nausea before eating solid foods. General instructions  Take over-the-counter and prescription medicines only as told by your health care provider.  If you have sleep apnea, surgery and certain  medicines can increase your risk for breathing problems. Follow instructions from your health care provider about wearing your sleep device: ? Anytime you are sleeping, including during daytime naps. ? While taking prescription pain medicines, sleeping medicines, or medicines that make you drowsy.  If you smoke, do not smoke without supervision.  Keep all follow-up visits as told by your health care provider. This is important. Contact a health care provider if:  You keep feeling nauseous or you keep vomiting.  You feel light-headed.  You develop a rash.  You have a fever. Get help right away if:  You have trouble breathing. Summary  For several hours after your procedure, you may feel sleepy and have poor judgment.  Have a responsible adult stay with you for at least 24 hours or until you are awake and alert. This information is not intended to replace advice given to you by your health care provider. Make sure you discuss any questions you have with your health care provider. Document Released: 07/11/2015 Document Revised: 06/18/2017 Document Reviewed: 07/11/2015 Elsevier Patient Education  2020 [employer].

## 2019-03-06 ENCOUNTER — Ambulatory Visit (HOSPITAL_COMMUNITY): Payer: Medicare Other | Admitting: Anesthesiology

## 2019-03-06 ENCOUNTER — Encounter (HOSPITAL_COMMUNITY)
Admission: RE | Disposition: A | Discharge status: Home / Self Care | Payer: Self-pay | Source: Ambulatory Visit | Attending: Internal Medicine

## 2019-03-06 ENCOUNTER — Ambulatory Visit (HOSPITAL_COMMUNITY)
Admission: RE | Admit: 2019-03-06 | Discharge: 2019-03-06 | Disposition: A | Discharge status: Home / Self Care | Payer: Medicare Other | Source: Ambulatory Visit | Attending: Internal Medicine | Admitting: Internal Medicine

## 2019-03-06 ENCOUNTER — Encounter (HOSPITAL_COMMUNITY): Payer: Self-pay | Admitting: *Deleted

## 2019-03-06 DIAGNOSIS — K2100 Gastro-esophageal reflux disease with esophagitis, without bleeding: Secondary | ICD-10-CM | POA: Insufficient documentation

## 2019-03-06 DIAGNOSIS — I129 Hypertensive chronic kidney disease with stage 1 through stage 4 chronic kidney disease, or unspecified chronic kidney disease: Secondary | ICD-10-CM | POA: Insufficient documentation

## 2019-03-06 DIAGNOSIS — R1310 Dysphagia, unspecified: Secondary | ICD-10-CM | POA: Insufficient documentation

## 2019-03-06 DIAGNOSIS — J449 Chronic obstructive pulmonary disease, unspecified: Secondary | ICD-10-CM | POA: Insufficient documentation

## 2019-03-06 DIAGNOSIS — K219 Gastro-esophageal reflux disease without esophagitis: Secondary | ICD-10-CM | POA: Diagnosis not present

## 2019-03-06 DIAGNOSIS — G4733 Obstructive sleep apnea (adult) (pediatric): Secondary | ICD-10-CM | POA: Diagnosis not present

## 2019-03-06 DIAGNOSIS — G4730 Sleep apnea, unspecified: Secondary | ICD-10-CM | POA: Diagnosis not present

## 2019-03-06 DIAGNOSIS — I2510 Atherosclerotic heart disease of native coronary artery without angina pectoris: Secondary | ICD-10-CM | POA: Diagnosis not present

## 2019-03-06 DIAGNOSIS — M109 Gout, unspecified: Secondary | ICD-10-CM | POA: Diagnosis not present

## 2019-03-06 DIAGNOSIS — Z923 Personal history of irradiation: Secondary | ICD-10-CM | POA: Diagnosis not present

## 2019-03-06 DIAGNOSIS — Z79899 Other long term (current) drug therapy: Secondary | ICD-10-CM | POA: Diagnosis not present

## 2019-03-06 DIAGNOSIS — N183 Chronic kidney disease, stage 3 unspecified: Secondary | ICD-10-CM | POA: Diagnosis not present

## 2019-03-06 DIAGNOSIS — K3189 Other diseases of stomach and duodenum: Secondary | ICD-10-CM | POA: Diagnosis not present

## 2019-03-06 DIAGNOSIS — K209 Esophagitis, unspecified without bleeding: Secondary | ICD-10-CM | POA: Diagnosis not present

## 2019-03-06 DIAGNOSIS — Z8546 Personal history of malignant neoplasm of prostate: Secondary | ICD-10-CM | POA: Insufficient documentation

## 2019-03-06 DIAGNOSIS — I10 Essential (primary) hypertension: Secondary | ICD-10-CM | POA: Diagnosis not present

## 2019-03-06 DIAGNOSIS — K317 Polyp of stomach and duodenum: Secondary | ICD-10-CM | POA: Insufficient documentation

## 2019-03-06 DIAGNOSIS — F17210 Nicotine dependence, cigarettes, uncomplicated: Secondary | ICD-10-CM | POA: Insufficient documentation

## 2019-03-06 HISTORY — PX: BIOPSY: SHX5522

## 2019-03-06 HISTORY — PX: MALONEY DILATION: SHX5535

## 2019-03-06 HISTORY — PX: ESOPHAGOGASTRODUODENOSCOPY (EGD) WITH PROPOFOL: SHX5813

## 2019-03-06 SURGERY — ESOPHAGOGASTRODUODENOSCOPY (EGD) WITH PROPOFOL
Anesthesia: General

## 2019-03-06 MED ORDER — MIDAZOLAM HCL 2 MG/2ML IJ SOLN: 0.5000 mg | Freq: Once | INTRAMUSCULAR | Status: DC | PRN

## 2019-03-06 MED ORDER — PROPOFOL 10 MG/ML IV BOLUS
INTRAVENOUS | Status: AC
  Filled 2019-03-06: qty 20

## 2019-03-06 MED ORDER — LACTATED RINGERS IV SOLN
INTRAVENOUS | Status: DC
  Administered 2019-03-06: 1000 mL via INTRAVENOUS

## 2019-03-06 MED ORDER — CHLORHEXIDINE GLUCONATE CLOTH 2 % EX PADS: 6.0000 | MEDICATED_PAD | Freq: Once | CUTANEOUS | Status: DC

## 2019-03-06 MED ORDER — PROMETHAZINE HCL 25 MG/ML IJ SOLN: 6.2500 mg | INTRAMUSCULAR | Status: DC | PRN

## 2019-03-06 MED ORDER — PROPOFOL 500 MG/50ML IV EMUL
INTRAVENOUS | Status: DC | PRN
  Administered 2019-03-06: 150 ug/kg/min via INTRAVENOUS

## 2019-03-06 MED ORDER — KETAMINE HCL 50 MG/5ML IJ SOSY
PREFILLED_SYRINGE | INTRAMUSCULAR | Status: AC
  Filled 2019-03-06: qty 5

## 2019-03-06 MED ORDER — KETAMINE HCL 10 MG/ML IJ SOLN
INTRAMUSCULAR | Status: DC | PRN
  Administered 2019-03-06: 30 mg via INTRAVENOUS

## 2019-03-06 NOTE — Discharge Instructions (Signed)
EGD Discharge instructions Please read the instructions outlined below and refer to this sheet in the next few weeks. These discharge instructions provide you with general information on caring for yourself after you leave the hospital. Your doctor may also give you specific instructions. While your treatment has been planned according to the most current medical practices available, unavoidable complications occasionally occur. If you have any problems or questions after discharge, please call your doctor. ACTIVITY  You may resume your regular activity but move at a slower pace for the next 24 hours.   Take frequent rest periods for the next 24 hours.   Walking will help expel (get rid of) the air and reduce the bloated feeling in your abdomen.   No driving for 24 hours (because of the anesthesia (medicine) used during the test).   You may shower.   Do not sign any important legal documents or operate any machinery for 24 hours (because of the anesthesia used during the test).  NUTRITION  Drink plenty of fluids.   You may resume your normal diet.   Begin with a light meal and progress to your normal diet.   Avoid alcoholic beverages for 24 hours or as instructed by your caregiver.  MEDICATIONS  You may resume your normal medications unless your caregiver tells you otherwise.  WHAT YOU CAN EXPECT TODAY  You may experience abdominal discomfort such as a feeling of fullness or gas pains.  FOLLOW-UP  Your doctor will discuss the results of your test with you.  SEEK IMMEDIATE MEDICAL ATTENTION IF ANY OF THE FOLLOWING OCCUR:  Excessive nausea (feeling sick to your stomach) and/or vomiting.   Severe abdominal pain and distention (swelling).   Trouble swallowing.   Temperature over 101 F (37.8 C).   Rectal bleeding or vomiting of blood.    GERD information provided  Resume Protonix 40 mg twice daily  Chew food thoroughly; take 30 minutes to eat a meal.  Eat  slowly.  Further recommendations to follow pending review of pathology report  Office visit with [place] in 6 months  At patient request, I called [name], wife, at [phone] and discussed results.    Gastroesophageal Reflux Disease, Adult Gastroesophageal reflux (GER) happens when acid from the stomach flows up into the tube that connects the mouth and the stomach (esophagus). Normally, food travels down the esophagus and stays in the stomach to be digested. With GER, food and stomach acid sometimes move back up into the esophagus. You may have a disease called gastroesophageal reflux disease (GERD) if the reflux:  Happens often.  Causes frequent or very bad symptoms.  Causes problems such as damage to the esophagus. When this happens, the esophagus becomes sore and swollen (inflamed). Over time, GERD can make small holes (ulcers) in the lining of the esophagus. What are the causes? This condition is caused by a problem with the muscle between the esophagus and the stomach. When this muscle is weak or not normal, it does not close properly to keep food and acid from coming back up from the stomach. The muscle can be weak because of:  Tobacco use.  Pregnancy.  Having a certain type of hernia (hiatal hernia).  Alcohol use.  Certain foods and drinks, such as coffee, chocolate, onions, and peppermint. What increases the risk? You are more likely to develop this condition if you:  Are overweight.  Have a disease that affects your connective tissue.  Use NSAID medicines. What are the signs or symptoms? Symptoms of  this condition include:  Heartburn.  Difficult or painful swallowing.  The feeling of having a lump in the throat.  A bitter taste in the mouth.  Bad breath.  Having a lot of saliva.  Having an upset or bloated stomach.  Belching.  Chest pain. Different conditions can cause chest pain. Make sure you see your doctor if you have chest pain.  Shortness of  breath or noisy breathing (wheezing).  Ongoing (chronic) cough or a cough at night.  Wearing away of the surface of teeth (tooth enamel).  Weight loss. How is this treated? Treatment will depend on how bad your symptoms are. Your doctor may suggest:  Changes to your diet.  Medicine.  Surgery. Follow these instructions at home: Eating and drinking   Follow a diet as told by your doctor. You may need to avoid foods and drinks such as: ? Coffee and tea (with or without caffeine). ? Drinks that contain alcohol. ? Energy drinks and sports drinks. ? Bubbly (carbonated) drinks or sodas. ? Chocolate and cocoa. ? Peppermint and mint flavorings. ? Garlic and onions. ? Horseradish. ? Spicy and acidic foods. These include peppers, chili powder, curry powder, vinegar, hot sauces, and BBQ sauce. ? Citrus fruit juices and citrus fruits, such as oranges, lemons, and limes. ? Tomato-based foods. These include red sauce, chili, salsa, and pizza with red sauce. ? Fried and fatty foods. These include donuts, french fries, potato chips, and high-fat dressings. ? High-fat meats. These include hot dogs, rib eye steak, sausage, ham, and bacon. ? High-fat dairy items, such as whole milk, butter, and cream cheese.  Eat small meals often. Avoid eating large meals.  Avoid drinking large amounts of liquid with your meals.  Avoid eating meals during the 2-3 hours before bedtime.  Avoid lying down right after you eat.  Do not exercise right after you eat. Lifestyle   Do not use any products that contain nicotine or tobacco. These include cigarettes, e-cigarettes, and chewing tobacco. If you need help quitting, ask your doctor.  Try to lower your stress. If you need help doing this, ask your doctor.  If you are overweight, lose an amount of weight that is healthy for you. Ask your doctor about a safe weight loss goal. General instructions  Pay attention to any changes in your symptoms.  Take  over-the-counter and prescription medicines only as told by your doctor. Do not take aspirin, ibuprofen, or other NSAIDs unless your doctor says it is okay.  Wear loose clothes. Do not wear anything tight around your waist.  Raise (elevate) the head of your bed about 6 inches (15 cm).  Avoid bending over if this makes your symptoms worse.  Keep all follow-up visits as told by your doctor. This is important. Contact a doctor if:  You have new symptoms.  You lose weight and you do not know why.  You have trouble swallowing or it hurts to swallow.  You have wheezing or a cough that keeps happening.  Your symptoms do not get better with treatment.  You have a hoarse voice. Get help right away if:  You have pain in your arms, neck, jaw, teeth, or back.  You feel sweaty, dizzy, or light-headed.  You have chest pain or shortness of breath.  You throw up (vomit) and your throw-up looks like blood or coffee grounds.  You pass out (faint).  Your poop (stool) is bloody or black.  You cannot swallow, drink, or eat. Summary  If a  person has gastroesophageal reflux disease (GERD), food and stomach acid move back up into the esophagus and cause symptoms or problems such as damage to the esophagus.  Treatment will depend on how bad your symptoms are.  Follow a diet as told by your doctor.  Take all medicines only as told by your doctor. This information is not intended to replace advice given to you by your health care provider. Make sure you discuss any questions you have with your health care provider. Document Released: 09/06/2007 Document Revised: 09/26/2017 Document Reviewed: 09/26/2017 Elsevier Patient Education  2020 [place] After These instructions provide you with information about caring for yourself after your procedure. Your health care provider may also give you more specific instructions. Your treatment has been planned  according to current medical practices, but problems sometimes occur. Call your health care provider if you have any problems or questions after your procedure. What can I expect after the procedure? After your procedure, you may:  Feel sleepy for several hours.  Feel clumsy and have poor balance for several hours.  Feel forgetful about what happened after the procedure.  Have poor judgment for several hours.  Feel nauseous or vomit.  Have a sore throat if you had a breathing tube during the procedure. Follow these instructions at home: For at least 24 hours after the procedure:      Have a responsible adult stay with you. It is important to have someone help care for you until you are awake and alert.  Rest as needed.  Do not: ? Participate in activities in which you could fall or become injured. ? Drive. ? Use heavy machinery. ? Drink alcohol. ? Take sleeping pills or medicines that cause drowsiness. ? Make important decisions or sign legal documents. ? Take care of children on your own. Eating and drinking  Follow the diet that is recommended by your health care provider.  If you vomit, drink water, juice, or soup when you can drink without vomiting.  Make sure you have little or no nausea before eating solid foods. General instructions  Take over-the-counter and prescription medicines only as told by your health care provider.  If you have sleep apnea, surgery and certain medicines can increase your risk for breathing problems. Follow instructions from your health care provider about wearing your sleep device: ? Anytime you are sleeping, including during daytime naps. ? While taking prescription pain medicines, sleeping medicines, or medicines that make you drowsy.  If you smoke, do not smoke without supervision.  Keep all follow-up visits as told by your health care provider. This is important. Contact a health care provider if:  You keep feeling nauseous or  you keep vomiting.  You feel light-headed.  You develop a rash.  You have a fever. Get help right away if:  You have trouble breathing. Summary  For several hours after your procedure, you may feel sleepy and have poor judgment.  Have a responsible adult stay with you for at least 24 hours or until you are awake and alert. This information is not intended to replace advice given to you by your health care provider. Make sure you discuss any questions you have with your health care provider. Document Released: 07/11/2015 Document Revised: 06/18/2017 Document Reviewed: 07/11/2015 Elsevier Patient Education  2020 [employer].

## 2019-03-06 NOTE — Anesthesia Postprocedure Evaluation (Signed)
Anesthesia Post Note  Patient: [name]  Procedure(s) Performed: ESOPHAGOGASTRODUODENOSCOPY (EGD) WITH PROPOFOL (N/A ) MALONEY DILATION (N/A ) BIOPSY  Patient location during evaluation: PACU Anesthesia Type: MAC Level of consciousness: awake, oriented and awake and alert Pain management: pain level controlled Vital Signs Assessment: post-procedure vital signs reviewed and stable Respiratory status: spontaneous breathing, respiratory function stable and nonlabored ventilation Cardiovascular status: stable Postop Assessment: no apparent nausea or vomiting Anesthetic complications: no     Last Vitals:  Vitals:   03/06/19 1318  BP: (!) 150/89  Pulse: 71  Resp: (!) 24  Temp: 36.7 C  SpO2: 96%    Last Pain:  Vitals:   03/06/19 1440  TempSrc:   PainSc: 0-No pain                 Kenslie Abbruzzese

## 2019-03-06 NOTE — Anesthesia Preprocedure Evaluation (Signed)
Anesthesia Evaluation  Patient identified by MRN, date of birth, ID band Patient awake    Reviewed: Allergy & Precautions, NPO status , Patient's Chart, lab work & pertinent test results, reviewed documented beta blocker date and time   Airway Mallampati: I  TM Distance: >3 FB Neck ROM: Full    Dental no notable dental hx. (+) Poor Dentition, Teeth Intact Front upper Right tooth discolored and broken :   Pulmonary sleep apnea , COPD, Current SmokerPatient did not abstain from smoking.,  States No CPAP use -unable to tolerate  Denies o2 use or inhaler use  Smokes 1/2 ppd -smoked today despite instructions    Pulmonary exam normal breath sounds clear to auscultation       Cardiovascular Exercise Tolerance: Poor hypertension, Pt. on medications and Pt. on home beta blockers + CAD  Normal cardiovascular examII Rhythm:Regular Rate:Normal  States has balance issues - tends to fall -so sedentary   Neuro/Psych Anxiety Depression negative neurological ROS  negative psych ROS   GI/Hepatic Neg liver ROS, GERD  Medicated and Controlled,  Endo/Other  Morbid obesityBMI>40  Renal/GU Renal InsufficiencyRenal disease  negative genitourinary   Musculoskeletal negative musculoskeletal ROS (+)   Abdominal   Peds negative pediatric ROS (+)  Hematology negative hematology ROS (+)   Anesthesia Other Findings   Reproductive/Obstetrics negative OB ROS                             Anesthesia Physical Anesthesia Plan  ASA: IV  Anesthesia Plan: General   Post-op Pain Management:    Induction: Intravenous  PONV Risk Score and Plan: Propofol infusion, TIVA and Treatment may vary due to age or medical condition  Airway Management Planned: Nasal Cannula and Simple Face Mask  Additional Equipment:   Intra-op Plan:   Post-operative Plan:   Informed Consent: I have reviewed the patients History and  Physical, chart, labs and discussed the procedure including the risks, benefits and alternatives for the proposed anesthesia with the patient or authorized representative who has indicated his/her understanding and acceptance.     Dental advisory given  Plan Discussed with: CRNA  Anesthesia Plan Comments: (Plan Full PPE use  Plan GA with GETA as needed d/w pt -WTP with same after Q&A)        Anesthesia Quick Evaluation

## 2019-03-06 NOTE — Op Note (Signed)
[facility] Patient Name: [name] Procedure Date: 03/06/2019 2:23 PM MRN: [id number] Date of Birth: 04-Jun-1945 Attending MD: Norvel Richards , MD CSN: [id number] Age: 73 Admit Type: Outpatient Procedure:                Upper GI endoscopy Indications:              Dysphagia Providers:                Norvel Richards, MD, Jeanann Lewandowsky. Sharon Seller, RN,                            Aram Candela Referring MD:              Medicines:                Propofol per Anesthesia Complications:            No immediate complications. Estimated Blood Loss:     Estimated blood loss was minimal. Procedure:                Pre-Anesthesia Assessment:                           - Prior to the procedure, a History and Physical                            was performed, and patient medications and                            allergies were reviewed. The patient's tolerance of                            previous anesthesia was also reviewed. The risks                            and benefits of the procedure and the sedation                            options and risks were discussed with the patient.                            All questions were answered, and informed consent                            was obtained. Prior Anticoagulants: The patient has                            taken no previous anticoagulant or antiplatelet                            agents. ASA Grade Assessment: III - A patient with                            severe systemic disease. After reviewing the risks  and benefits, the patient was deemed in                            satisfactory condition to undergo the procedure.                           After obtaining informed consent, the endoscope was                            passed under direct vision. Throughout the                            procedure, the patient's blood pressure, pulse, and                            oxygen saturations were  monitored continuously. The                            GIF-[id number] ([id number]) scope was introduced through the                            mouth, and advanced to the second part of duodenum. Scope In: 2:44:46 PM Scope Out: 2:50:27 PM Total Procedure Duration: 0 hours 5 minutes 41 seconds  Findings:      Esophagitis was found. 5 cm in length linear esophageal erosion. No       nodularity. No tumor. No Barrett's epithelium seen the scope was       withdrawn. Dilation was performed with a Maloney dilator with mild       resistance at 56 Fr. The scope was withdrawn. Dilation was performed       with a Maloney dilator with mild resistance at 59 Fr. The dilation site       was examined following endoscope reinsertion and showed no change.       Estimated blood loss: none.      Nodular "bumpy" mucosa involving the body and fundus diffusely. No       obvious infiltrating process seen. No ulcer. Patent pylorus. This was       biopsied with a cold forceps for histology. Estimated blood loss was       minimal.      The duodenal bulb and second portion of the duodenum were normal. Impression:               -Erosive reflux reflux esophagitis. Dilated.                           -Abnormal gastric mucosa uncertain                            significance?"biopsied.                           - Normal duodenal bulb and second portion of the                            duodenum. Moderate Sedation:      Moderate (conscious) sedation was personally administered by an  anesthesia professional. The following parameters were monitored: oxygen       saturation, heart rate, blood pressure, respiratory rate, EKG, adequacy       of pulmonary ventilation, and response to care. Recommendation:           - Patient has a contact number available for                            emergencies. The signs and symptoms of potential                            delayed complications were discussed with the                             patient. Return to normal activities tomorrow.                            Written discharge instructions were provided to the                            patient.                           - Resume previous diet.                           - Continue present medications. Patient to resume                            Protonix 40 mg twice daily.                           - Await pathology results.                           -Office visit with [place] in 3 months Procedure Code(s):        --- Professional ---                           [phone], Esophagogastroduodenoscopy, flexible,                            transoral; with biopsy, single or multiple                           43450, Dilation of esophagus, by unguided sound or                            bougie, single or multiple passes Diagnosis Code(s):        --- Professional ---                           K20.90, Esophagitis, unspecified without bleeding                           K31.89, Other diseases of stomach and duodenum  R13.10, Dysphagia, unspecified CPT copyright 2019 American Medical Association. All rights reserved. The codes documented in this report are preliminary and upon coder review may  be revised to meet current compliance requirements. [name]. [name], MD Norvel Richards, MD 03/06/2019 3:10:54 PM This report has been signed electronically. Number of Addenda: 0

## 2019-03-06 NOTE — Transfer of Care (Signed)
Immediate Anesthesia Transfer of Care Note  Patient: ADEWALE PUCILLO  Procedure(s) Performed: ESOPHAGOGASTRODUODENOSCOPY (EGD) WITH PROPOFOL (N/A ) MALONEY DILATION (N/A ) BIOPSY  Patient Location: PACU  Anesthesia Type:MAC  Level of Consciousness: awake, alert , oriented and patient cooperative  Airway & Oxygen Therapy: Patient Spontanous Breathing and Patient connected to face mask oxygen  Post-op Assessment: Report given to RN, Post -op Vital signs reviewed and stable and Patient moving all extremities X 4  Post vital signs: Reviewed and stable  Last Vitals:  Vitals Value Taken Time  BP    Temp    Pulse    Resp 21 03/06/19 1457  SpO2    Vitals shown include unvalidated device data.  Last Pain:  Vitals:   03/06/19 1440  TempSrc:   PainSc: 0-No pain      Patients Stated Pain Goal: 6 (63/84/66 5993)  Complications: No apparent anesthesia complications

## 2019-03-07 ENCOUNTER — Ambulatory Visit (INDEPENDENT_AMBULATORY_CARE_PROVIDER_SITE_OTHER): Payer: Medicare Other | Admitting: Urology

## 2019-03-07 ENCOUNTER — Other Ambulatory Visit: Payer: Self-pay

## 2019-03-07 DIAGNOSIS — N3281 Overactive bladder: Secondary | ICD-10-CM

## 2019-03-07 DIAGNOSIS — C61 Malignant neoplasm of prostate: Secondary | ICD-10-CM | POA: Diagnosis not present

## 2019-03-07 DIAGNOSIS — R9721 Rising PSA following treatment for malignant neoplasm of prostate: Secondary | ICD-10-CM

## 2019-03-10 ENCOUNTER — Encounter: Payer: Self-pay | Admitting: Internal Medicine

## 2019-03-10 ENCOUNTER — Other Ambulatory Visit: Payer: Self-pay

## 2019-03-10 LAB — SURGICAL PATHOLOGY

## 2019-03-11 ENCOUNTER — Encounter (HOSPITAL_COMMUNITY): Payer: Self-pay | Admitting: Internal Medicine

## 2019-03-19 ENCOUNTER — Encounter: Payer: Self-pay | Admitting: Internal Medicine

## 2019-04-11 ENCOUNTER — Other Ambulatory Visit: Payer: Self-pay | Admitting: Family Medicine

## 2019-04-11 DIAGNOSIS — R39198 Other difficulties with micturition: Secondary | ICD-10-CM

## 2019-04-11 DIAGNOSIS — C61 Malignant neoplasm of prostate: Secondary | ICD-10-CM

## 2019-04-11 DIAGNOSIS — N401 Enlarged prostate with lower urinary tract symptoms: Secondary | ICD-10-CM

## 2019-04-28 ENCOUNTER — Ambulatory Visit: Payer: Medicare Other | Admitting: Family Medicine

## 2019-05-20 ENCOUNTER — Telehealth: Payer: Self-pay | Admitting: Family Medicine

## 2019-05-20 DIAGNOSIS — M5136 Other intervertebral disc degeneration, lumbar region: Secondary | ICD-10-CM | POA: Diagnosis not present

## 2019-05-20 NOTE — Chronic Care Management (AMB) (Signed)
  Chronic Care Management   Note  05/20/2019 Name: TASEAN MANCHA MRN: [id number] DOB: 10-16-1945  DUVID SMALLS is a 74 y.o. year old male who is a primary care patient of [name], [name], MD. I reached out to [name] by phone today in response to a referral sent by Mr. Stefanos Haynesworth [facility] health plan.     Mr. Joss was given information about Chronic Care Management services today including:  1. CCM service includes personalized support from designated clinical staff supervised by his physician, including individualized plan of care and coordination with other care providers 2. 24/7 contact phone numbers for assistance for urgent and routine care needs. 3. Service will only be billed when office clinical staff spend 20 minutes or more in a month to coordinate care. 4. Only one practitioner may furnish and bill the service in a calendar month. 5. The patient may stop CCM services at any time (effective at the end of the month) by phone call to the office staff. 6. The patient will be responsible for cost sharing (co-pay) of up to 20% of the service fee (after annual deductible is met).  Patient agreed to services and verbal consent obtained.   Follow up plan: Telephone appointment with care management team member scheduled for:06/19/2019  Noreene Larsson, [place], [place], [place] [address] Direct Dial: [phone] [email or web address] Website: [email or web address]

## 2019-06-04 ENCOUNTER — Other Ambulatory Visit: Payer: Self-pay | Admitting: Family Medicine

## 2019-06-04 DIAGNOSIS — F339 Major depressive disorder, recurrent, unspecified: Secondary | ICD-10-CM

## 2019-06-04 DIAGNOSIS — G6289 Other specified polyneuropathies: Secondary | ICD-10-CM

## 2019-06-06 ENCOUNTER — Ambulatory Visit: Payer: Medicare Other | Admitting: Urology

## 2019-06-16 ENCOUNTER — Ambulatory Visit (INDEPENDENT_AMBULATORY_CARE_PROVIDER_SITE_OTHER): Payer: Medicare Other | Admitting: Family Medicine

## 2019-06-16 ENCOUNTER — Other Ambulatory Visit: Payer: Self-pay

## 2019-06-16 ENCOUNTER — Encounter: Payer: Self-pay | Admitting: Family Medicine

## 2019-06-16 VITALS — BP 126/70 | HR 63 | Temp 98.9°F | Ht 68.0 in | Wt 282.0 lb

## 2019-06-16 DIAGNOSIS — F339 Major depressive disorder, recurrent, unspecified: Secondary | ICD-10-CM | POA: Diagnosis not present

## 2019-06-16 DIAGNOSIS — I10 Essential (primary) hypertension: Secondary | ICD-10-CM | POA: Diagnosis not present

## 2019-06-16 DIAGNOSIS — R7303 Prediabetes: Secondary | ICD-10-CM | POA: Diagnosis not present

## 2019-06-16 DIAGNOSIS — E782 Mixed hyperlipidemia: Secondary | ICD-10-CM | POA: Diagnosis not present

## 2019-06-16 DIAGNOSIS — R42 Dizziness and giddiness: Secondary | ICD-10-CM

## 2019-06-16 DIAGNOSIS — G4733 Obstructive sleep apnea (adult) (pediatric): Secondary | ICD-10-CM | POA: Diagnosis not present

## 2019-06-16 LAB — BAYER DCA HB A1C WAIVED: HB A1C (BAYER DCA - WAIVED): 5.6 % (ref <7.0)

## 2019-06-16 MED ORDER — DICLOFENAC SODIUM 1 % EX GEL: 2.0000 g | Freq: Four times a day (QID) | CUTANEOUS | 3 refills | Status: DC

## 2019-06-16 NOTE — Progress Notes (Signed)
BP 126/70   Pulse 63   Temp 98.9 F (37.2 C)   Ht _0  (1.727 m)   Wt 282 lb (127.9 kg)   SpO2 97%   BMI 42.88 kg/m    Subjective:   Patient ID: [name], male    DOB: [birth year], 74 y.o.   MRN: [id number]  HPI: [name] is a 74 y.o. male presenting on 06/16/2019 for Medical Management of Chronic Issues and Discus Referral   HPI Obstructive sleep apnea Patient has a diagnosis in the past 12 years ago with obstructive sleep apnea, he says is been worsening to where he is falling asleep all the time and can even drive anywhere without falling asleep and he has dry mouth from snoring and get some headaches.  He says he is tired all the time and just not sleeping well.  He does not have any kind of sleep machine and has not had 1 to 20 years ago.  Will refer to new sleep specialist, patient also has chronic dizziness and off-balance sensation that is been going on and off since he had a traumatic brain injury in the past.  Prediabetes Patient comes in today for recheck of his diabetes. Patient has been currently taking no medication has been diet controlled, he does complain of increased thirst. Patient is currently on an ACE inhibitor/ARB. Patient has not seen an ophthalmologist this year. Patient denies any issues with their feet.   Hypertension Patient is currently on losartan amlodipine and carvedilol, and their blood pressure today is 126/70. Patient denies any lightheadedness or dizziness. Patient denies headaches, blurred vision, chest pains, shortness of breath, or weakness. Denies any side effects from medication and is content with current medication.   Hyperlipidemia Patient is coming in for recheck of his hyperlipidemia. The patient is currently taking no medication. They deny any issues with myalgias or history of liver damage from it. They deny any focal numbness or weakness or chest pain.   Depression recheck Patient is currently on Cymbalta for  depression and feels like things are going pretty well mood wise.  He denies any suicidal ideations or thoughts of hurt himself. PHQ-9 of 10  Relevant past medical, surgical, family and social history reviewed and updated as indicated. Interim medical history since our last visit reviewed. Allergies and medications reviewed and updated.  Review of Systems  Constitutional: Negative for chills and fever.  Respiratory: Negative for shortness of breath and wheezing.   Cardiovascular: Negative for chest pain and leg swelling.  Musculoskeletal: Negative for back pain and gait problem.  Skin: Negative for rash.  Neurological: Positive for dizziness (Off-balance sensation, has been going on chronically). Negative for light-headedness and numbness.  All other systems reviewed and are negative.   Per HPI unless specifically indicated above   Allergies as of 06/16/2019      Reactions   Carbidopa-levodopa Other (See Comments)   hallunications   Morphine Sulfate Nausea And Vomiting   Sulfa Antibiotics Nausea And Vomiting   Sulfacetamide Sodium Nausea And Vomiting      Medication List       Accurate as of June 16, 2019  1:48 PM. If you have any questions, ask your nurse or doctor.        allopurinol 300 MG tablet Commonly known as: ZYLOPRIM Take 1 tablet (300 mg total) by mouth daily.   amLODipine 10 MG tablet Commonly known as: NORVASC Take 1 tablet (10 mg total) by mouth daily. TAKE 1  TABLET BY MOUTH ONCE DAILY (NEEDS  TO  BE  SEEN  BEFORE  NEXT  REFILL)   carvedilol 25 MG tablet Commonly known as: COREG Take 1 tablet (25 mg total) by mouth 2 (two) times daily.   diclofenac Sodium 1 % Gel Commonly known as: Voltaren Apply 2 g topically 4 (four) times daily. Started by: Worthy Rancher, MD   DULoxetine 60 MG capsule Commonly known as: CYMBALTA TAKE 1 CAPSULE BY MOUTH ONCE DAILY IN THE EVENING   gabapentin 100 MG capsule Commonly known as: NEURONTIN Take 1 capsule (100  mg total) by mouth 3 (three) times daily. What changed: when to take this   losartan 50 MG tablet Commonly known as: COZAAR Take 1 tablet (50 mg total) by mouth daily.   pantoprazole 40 MG tablet Commonly known as: PROTONIX Take 1 tablet (40 mg total) by mouth 2 (two) times daily.   tamsulosin 0.4 MG Caps capsule Commonly known as: FLOMAX Take 1 capsule by mouth once daily   tolterodine 4 MG 24 hr capsule Commonly known as: DETROL LA Take 4 mg by mouth daily.        Objective:   BP 126/70   Pulse 63   Temp 98.9 F (37.2 C)   Ht _0  (1.727 m)   Wt 282 lb (127.9 kg)   SpO2 97%   BMI 42.88 kg/m   Wt Readings from Last 3 Encounters:  06/16/19 282 lb (127.9 kg)  03/05/19 272 lb (123.4 kg)  03/04/19 273 lb 3.2 oz (123.9 kg)    Physical Exam Vitals and nursing note reviewed.  Constitutional:      General: He is not in acute distress.    Appearance: He is well-developed. He is not diaphoretic.  Eyes:     General: No scleral icterus.       Right eye: No discharge.     Conjunctiva/sclera: Conjunctivae normal.     Pupils: Pupils are equal, round, and reactive to light.  Neck:     Thyroid: No thyromegaly.  Cardiovascular:     Rate and Rhythm: Normal rate and regular rhythm.     Heart sounds: Normal heart sounds. No murmur.  Pulmonary:     Effort: Pulmonary effort is normal. No respiratory distress.     Breath sounds: Normal breath sounds. No wheezing.  Musculoskeletal:        General: Normal range of motion.     Cervical back: Neck supple.  Lymphadenopathy:     Cervical: No cervical adenopathy.  Skin:    General: Skin is warm and dry.     Findings: No rash.  Neurological:     Mental Status: He is alert and oriented to person, place, and time.     Coordination: Coordination normal.  Psychiatric:        Behavior: Behavior normal.     Patient's sleep apnea and hypertension and cholesterol and prediabetes are more complicated by the patient's morbid obesity.   Discussed weight loss and lifestyle modification and exercise with the patient.   Assessment & Plan:   Problem List Items Addressed This Visit      Cardiovascular and Mediastinum   Essential hypertension, benign   Relevant Orders   CMP14+EGFR (Completed)     Respiratory   OSA (obstructive sleep apnea)   Relevant Orders   Ambulatory referral to Neurology     Other   Hyperlipidemia   Relevant Orders   Lipid panel (Completed)   Prediabetes - Primary  Relevant Orders   CMP14+EGFR (Completed)   Lipid panel (Completed)   TSH (Completed)   Bayer DCA Hb A1c Waived (Completed)   Morbid obesity ([place])   Depression, recurrent ([place])   Relevant Orders   CBC with Differential/Platelet (Completed)    Other Visit Diagnoses    Dizziness       Relevant Orders   Ambulatory referral to Neurology    Dizziness, will refer to a different neurologist, I did not have a good experience with when he saw previously.  Follow up plan: Return in about 4 months (around 10/16/2019), or if symptoms worsen or fail to improve, for Recheck prediabetes and hypertension.  Counseling provided for all of the vaccine components Orders Placed This Encounter  Procedures  . CBC with Differential/Platelet  . CMP14+EGFR  . Lipid panel  . TSH  . Bayer DCA Hb A1c Waived  . Ambulatory referral to Neurology    Caryl Pina, MD [facility] 06/16/2019, 1:48 PM

## 2019-06-17 LAB — CMP14+EGFR
ALT: 12 IU/L (ref 0–44)
AST: 15 IU/L (ref 0–40)
Albumin/Globulin Ratio: 1.6 (ref 1.2–2.2)
Albumin: 4 g/dL (ref 3.7–4.7)
Alkaline Phosphatase: 91 IU/L (ref 39–117)
BUN/Creatinine Ratio: 11 (ref 10–24)
BUN: 24 mg/dL (ref 8–27)
Bilirubin Total: <0.2 mg/dL (ref 0.0–1.2)
CO2: 23 mmol/L (ref 20–29)
Calcium: 8.7 mg/dL (ref 8.6–10.2)
Chloride: 103 mmol/L (ref 96–106)
Creatinine, Ser: 2.16 mg/dL — ABNORMAL HIGH (ref 0.76–1.27)
GFR calc Af Amer: 34 mL/min/{1.73_m2} — ABNORMAL LOW (ref >59)
GFR calc non Af Amer: 29 mL/min/{1.73_m2} — ABNORMAL LOW (ref >59)
Globulin, Total: 2.5 g/dL (ref 1.5–4.5)
Glucose: 81 mg/dL (ref 65–99)
Potassium: 3.9 mmol/L (ref 3.5–5.2)
Sodium: 139 mmol/L (ref 134–144)
Total Protein: 6.5 g/dL (ref 6.0–8.5)

## 2019-06-17 LAB — LIPID PANEL
Chol/HDL Ratio: 4.4 ratio (ref 0.0–5.0)
Cholesterol, Total: 197 mg/dL (ref 100–199)
HDL: 45 mg/dL (ref >39)
LDL Chol Calc (NIH): 124 mg/dL — ABNORMAL HIGH (ref 0–99)
Triglycerides: 155 mg/dL — ABNORMAL HIGH (ref 0–149)
VLDL Cholesterol Cal: 28 mg/dL (ref 5–40)

## 2019-06-17 LAB — CBC WITH DIFFERENTIAL/PLATELET
Basophils Absolute: 0 10*3/uL (ref 0.0–0.2)
Basos: 1 %
EOS (ABSOLUTE): 0.4 10*3/uL (ref 0.0–0.4)
Eos: 6 %
Hematocrit: 34.4 % — ABNORMAL LOW (ref 37.5–51.0)
Hemoglobin: 11.7 g/dL — ABNORMAL LOW (ref 13.0–17.7)
Immature Grans (Abs): 0 10*3/uL (ref 0.0–0.1)
Immature Granulocytes: 1 %
Lymphocytes Absolute: 0.9 10*3/uL (ref 0.7–3.1)
Lymphs: 14 %
MCH: 30.6 pg (ref 26.6–33.0)
MCHC: 34 g/dL (ref 31.5–35.7)
MCV: 90 fL (ref 79–97)
Monocytes Absolute: 0.6 10*3/uL (ref 0.1–0.9)
Monocytes: 9 %
Neutrophils Absolute: 4.5 10*3/uL (ref 1.4–7.0)
Neutrophils: 69 %
Platelets: 234 10*3/uL (ref 150–450)
RBC: 3.82 x10E6/uL — ABNORMAL LOW (ref 4.14–5.80)
RDW: 14.4 % (ref 11.6–15.4)
WBC: 6.5 10*3/uL (ref 3.4–10.8)

## 2019-06-17 LAB — TSH: TSH: 3.37 u[IU]/mL (ref 0.450–4.500)

## 2019-06-18 ENCOUNTER — Other Ambulatory Visit: Payer: Self-pay

## 2019-06-18 MED ORDER — PRAVASTATIN SODIUM 20 MG PO TABS: 20.0000 mg | ORAL_TABLET | Freq: Every day | ORAL | 3 refills | Status: DC

## 2019-06-19 ENCOUNTER — Ambulatory Visit: Payer: Medicare Other | Admitting: *Deleted

## 2019-06-19 DIAGNOSIS — E782 Mixed hyperlipidemia: Secondary | ICD-10-CM

## 2019-06-19 DIAGNOSIS — G4733 Obstructive sleep apnea (adult) (pediatric): Secondary | ICD-10-CM

## 2019-06-19 DIAGNOSIS — I10 Essential (primary) hypertension: Secondary | ICD-10-CM

## 2019-06-27 ENCOUNTER — Ambulatory Visit: Payer: Medicare Other | Admitting: Urology

## 2019-07-01 ENCOUNTER — Telehealth: Payer: Self-pay

## 2019-07-01 NOTE — Telephone Encounter (Signed)
  Patient Consent for Virtual Visit         [name] has provided verbal consent on 07/01/2019 for a virtual visit (video or telephone).   CONSENT FOR VIRTUAL VISIT FOR:  [name]  By participating in this virtual visit I agree to the following:  I hereby voluntarily request, consent and authorize [facility] and its employed or contracted physicians, physician assistants, nurse practitioners or other licensed health care professionals (the Practitioner), to provide me with telemedicine health care services (the "Services") as deemed necessary by the treating Practitioner. I acknowledge and consent to receive the Services by the Practitioner via telemedicine. I understand that the telemedicine visit will involve communicating with the Practitioner through live audiovisual communication technology and the disclosure of certain medical information by electronic transmission. I acknowledge that I have been given the opportunity to request an in-person assessment or other available alternative prior to the telemedicine visit and am voluntarily participating in the telemedicine visit.  I understand that I have the right to withhold or withdraw my consent to the use of telemedicine in the course of my care at any time, without affecting my right to future care or treatment, and that the Practitioner or I may terminate the telemedicine visit at any time. I understand that I have the right to inspect all information obtained and/or recorded in the course of the telemedicine visit and may receive copies of available information for a reasonable fee.  I understand that some of the potential risks of receiving the Services via telemedicine include:  [name] Delay or interruption in medical evaluation due to technological equipment failure or disruption; . Information transmitted may not be sufficient (e.g. poor resolution of images) to allow for appropriate medical decision making by the  Practitioner; and/or  . In rare instances, security protocols could fail, causing a breach of personal health information.  Furthermore, I acknowledge that it is my responsibility to provide information about my medical history, conditions and care that is complete and accurate to the best of my ability. I acknowledge that Practitioner's advice, recommendations, and/or decision may be based on factors not within their control, such as incomplete or inaccurate data provided by me or distortions of diagnostic images or specimens that may result from electronic transmissions. I understand that the practice of medicine is not an exact science and that Practitioner makes no warranties or guarantees regarding treatment outcomes. I acknowledge that a copy of this consent can be made available to me via my patient portal ([facility]), or I can request a printed copy by calling the office of [facility].    I understand that my insurance will be billed for this visit.   I have read or had this consent read to me. . I understand the contents of this consent, which adequately explains the benefits and risks of the Services being provided via telemedicine.  . I have been provided ample opportunity to ask questions regarding this consent and the Services and have had my questions answered to my satisfaction. . I give my informed consent for the services to be provided through the use of telemedicine in my medical care

## 2019-07-02 ENCOUNTER — Encounter: Payer: Self-pay | Admitting: Family Medicine

## 2019-07-02 ENCOUNTER — Ambulatory Visit (INDEPENDENT_AMBULATORY_CARE_PROVIDER_SITE_OTHER): Payer: Medicare Other | Admitting: Family Medicine

## 2019-07-02 DIAGNOSIS — R350 Frequency of micturition: Secondary | ICD-10-CM

## 2019-07-02 DIAGNOSIS — R42 Dizziness and giddiness: Secondary | ICD-10-CM | POA: Diagnosis not present

## 2019-07-02 DIAGNOSIS — F17210 Nicotine dependence, cigarettes, uncomplicated: Secondary | ICD-10-CM | POA: Diagnosis not present

## 2019-07-02 NOTE — Progress Notes (Signed)
Virtual Visit via telephone Note  I connected with [name] on 07/02/19 at 1350 by telephone and verified that I am speaking with the correct person using two identifiers. [name] is currently located at home and no other people are currently with her during visit. The provider, [name] Eartha Vonbehren, MD is located in their office at time of visit.  Call ended at 1401  I discussed the limitations, risks, security and privacy concerns of performing an evaluation and management service by telephone and the availability of in person appointments. I also discussed with the patient that there may be a patient responsible charge related to this service. The patient expressed understanding and agreed to proceed.   History and Present Illness: Patient had a trip to [place] and he has frequency and flank pain. He has been still having frequency for about 4-5 days.  He denies any fevers or chills. He says has not had pain as bad as his previous kidney stones.  He has been having dizziness since a traumatic head injury and has done PT 2 times and it is not improving.  He is still having dizzines and balance issues almost daily now. There is a referral in place for neurology and will check on it.   1. Frequency of urination   2. Dizziness     Outpatient Encounter Medications as of 07/02/2019  Medication Sig  . allopurinol (ZYLOPRIM) 300 MG tablet Take 1 tablet (300 mg total) by mouth daily.  [name] amLODipine (NORVASC) 10 MG tablet Take 1 tablet (10 mg total) by mouth daily. TAKE 1 TABLET BY MOUTH ONCE DAILY (NEEDS  TO  BE  SEEN  BEFORE  NEXT  REFILL)  . carvedilol (COREG) 25 MG tablet Take 1 tablet (25 mg total) by mouth 2 (two) times daily.  . diclofenac Sodium (VOLTAREN) 1 % GEL Apply 2 g topically 4 (four) times daily.  . DULoxetine (CYMBALTA) 60 MG capsule TAKE 1 CAPSULE BY MOUTH ONCE DAILY IN THE EVENING  . gabapentin (NEURONTIN) 100 MG capsule Take 1 capsule (100 mg total) by mouth  3 (three) times daily. (Patient taking differently: Take 100 mg by mouth at bedtime. )  . losartan (COZAAR) 50 MG tablet Take 1 tablet (50 mg total) by mouth daily.  . pantoprazole (PROTONIX) 40 MG tablet Take 1 tablet (40 mg total) by mouth 2 (two) times daily.  . pravastatin (PRAVACHOL) 20 MG tablet Take 1 tablet (20 mg total) by mouth daily.  . tamsulosin (FLOMAX) 0.4 MG CAPS capsule Take 1 capsule by mouth once daily  . tolterodine (DETROL LA) 4 MG 24 hr capsule Take 4 mg by mouth daily.   No facility-administered encounter medications on file as of 07/02/2019.    Review of Systems  Constitutional: Negative for chills and fever.  Eyes: Negative for visual disturbance.  Respiratory: Negative for shortness of breath and wheezing.   Cardiovascular: Negative for chest pain and leg swelling.  Gastrointestinal: Negative for abdominal pain.  Genitourinary: Positive for flank pain, frequency and urgency. Negative for decreased urine volume, difficulty urinating, dysuria and hematuria.  Musculoskeletal: Negative for back pain and gait problem.  Skin: Negative for rash.  Neurological: Positive for dizziness. Negative for weakness, light-headedness and numbness.  All other systems reviewed and are negative.   Observations/Objective: Patient sounds comfortable and in no acute distress  Assessment and Plan: Problem List Items Addressed This Visit    None    Visit Diagnoses    Frequency of  urination    -  Primary   Relevant Orders   Urinalysis, Complete   Urine Culture   Dizziness          Patient has a referral for neurology in place on May 12 for dizziness and will see if that helps.  We will have him come in and leave a urine culture to see if it is a kidney stone or infection. Follow up plan: Return if symptoms worsen or fail to improve.     I discussed the assessment and treatment plan with the patient. The patient was provided an opportunity to ask questions and all were  answered. The patient agreed with the plan and demonstrated an understanding of the instructions.   The patient was advised to call back or seek an in-person evaluation if the symptoms worsen or if the condition fails to improve as anticipated.  The above assessment and management plan was discussed with the patient. The patient verbalized understanding of and has agreed to the management plan. Patient is aware to call the clinic if symptoms persist or worsen. Patient is aware when to return to the clinic for a follow-up visit. Patient educated on when it is appropriate to go to the emergency department.    I provided 11 minutes of non-face-to-face time during this encounter.    Worthy Rancher, MD

## 2019-07-03 ENCOUNTER — Other Ambulatory Visit: Payer: Self-pay

## 2019-07-03 ENCOUNTER — Other Ambulatory Visit: Payer: Medicare Other

## 2019-07-03 ENCOUNTER — Telehealth (INDEPENDENT_AMBULATORY_CARE_PROVIDER_SITE_OTHER): Payer: Medicare Other | Admitting: Cardiology

## 2019-07-03 ENCOUNTER — Encounter: Payer: Self-pay | Admitting: Cardiology

## 2019-07-03 VITALS — BP 139/83 | HR 73 | Ht 68.0 in | Wt 282.0 lb

## 2019-07-03 DIAGNOSIS — I10 Essential (primary) hypertension: Secondary | ICD-10-CM

## 2019-07-03 DIAGNOSIS — I25119 Atherosclerotic heart disease of native coronary artery with unspecified angina pectoris: Secondary | ICD-10-CM | POA: Diagnosis not present

## 2019-07-03 DIAGNOSIS — R350 Frequency of micturition: Secondary | ICD-10-CM

## 2019-07-03 DIAGNOSIS — E782 Mixed hyperlipidemia: Secondary | ICD-10-CM

## 2019-07-03 LAB — MICROSCOPIC EXAMINATION
Bacteria, UA: NONE SEEN
Epithelial Cells (non renal): NONE SEEN /hpf (ref 0–10)
Renal Epithel, UA: NONE SEEN /hpf
WBC, UA: NONE SEEN /hpf (ref 0–5)

## 2019-07-03 LAB — URINALYSIS, COMPLETE
Bilirubin, UA: NEGATIVE
Glucose, UA: NEGATIVE
Ketones, UA: NEGATIVE
Leukocytes,UA: NEGATIVE
Nitrite, UA: NEGATIVE
Specific Gravity, UA: 1.025 (ref 1.005–1.030)
Urobilinogen, Ur: 0.2 mg/dL (ref 0.2–1.0)
pH, UA: 6.5 (ref 5.0–7.5)

## 2019-07-03 MED ORDER — ROSUVASTATIN CALCIUM 20 MG PO TABS: 20.0000 mg | ORAL_TABLET | Freq: Every day | ORAL | 3 refills | Status: DC

## 2019-07-03 NOTE — Patient Instructions (Signed)
Medication Instructions:  STOP Pravachol   START Crestor 20 mg daily at dinner   *If you need a refill on your cardiac medications before your next appointment, please call your pharmacy*   Lab Work: Fasting lipids and LFT's in 3 months  If you have labs (blood work) drawn today and your tests are completely normal, you will receive your results only by: [name] MyChart Message (if you have MyChart) OR . A paper copy in the mail If you have any lab test that is abnormal or we need to change your treatment, we will call you to review the results.   Testing/Procedures: None today   Follow-Up: At [facility], you and your health needs are our priority.  As part of our continuing mission to provide you with exceptional heart care, we have created designated Provider Care Teams.  These Care Teams include your primary Cardiologist (physician) and Advanced Practice Providers (APPs -  Physician Assistants and Nurse Practitioners) who all work together to provide you with the care you need, when you need it.  We recommend signing up for the patient portal called "MyChart".  Sign up information is provided on this After Visit Summary.  MyChart is used to connect with patients for Virtual Visits (Telemedicine).  Patients are able to view lab/test results, encounter notes, upcoming appointments, etc.  Non-urgent messages can be sent to your provider as well.   To learn more about what you can do with MyChart, go to [email or web address].    Your next appointment:   12 month(s)  The format for your next appointment:   In Person  Provider:   Rozann Lesches, MD   Other Instructions None       Thank you for choosing [facility] !

## 2019-07-03 NOTE — Progress Notes (Signed)
Virtual Visit via Telephone Note   This visit type was conducted due to national recommendations for restrictions regarding the COVID-19 Pandemic (e.g. social distancing) in an effort to limit this patient's exposure and mitigate transmission in our community.  Due to his co-morbid illnesses, this patient is at least at moderate risk for complications without adequate follow up.  This format is felt to be most appropriate for this patient at this time.  The patient did not have access to video technology/had technical difficulties with video requiring transitioning to audio format only (telephone).  All issues noted in this document were discussed and addressed.  No physical exam could be performed with this format.  Please refer to the patient's chart for his  consent to telehealth for [facility].  The patient was identified using 2 identifiers.  Date:  07/03/2019   ID:  [name], DOB [birth year], MRN [id number]  Patient Location: Home Provider Location: Office  PCP:  [name], [name], MD  Cardiologist:  Rozann Lesches, MD Electrophysiologist:  None   Evaluation Performed:  Follow-Up Visit  Chief Complaint:  Cardiac follow-up  History of Present Illness:    [name] is a 74 y.o. male last seen in October 2018.  We spoke by phone today.  From a cardiac perspective he does not report any angina symptoms.  He does get fatigued at times and reports functional limitations related to chronic pain and previous injuries.  I reviewed his current medications which are outlined below.  Cardiovascular regimen includes Coreg, Norvasc, Cozaar, and Pravachol.  I reviewed his recent lab work with LDL 124.  Today we discussed more aggressive lipid-lowering for further risk reduction.  Past Medical History:  Diagnosis Date  . Aortic atherosclerosis ([place])    2004  s/p  closure Penetrating atherosclerotic ulcer of infarenal aorta w/ endovascular stent graft  . Arthritis   . CKD  (chronic kidney disease), stage III   . Closed fracture of head of humerus 07/2017   right shoulder from fall; 09-25-2017 per pt no surgical intervention, only wore sling, intermittant pain  . Coronary atherosclerosis of native coronary artery    positive myoview for ischemia 09-27-2009;  10-01-2009 per cardiac cath-- minimal nonobstructive CAD w/ 30% LAD   . ED (erectile dysfunction)   . Emphysema/COPD [facility])    followed by pcp--- last exacerbation 09-12-2017;  09-25-2017 per pt no cough, sob or congestion  . Essential hypertension   . First degree heart block   . Frequency of urination   . GERD (gastroesophageal reflux disease)   . Gout    09-25-2017  per pt stable,  last episode 2015  . Heart murmur   . History of adenomatous polyp of colon   . History of closed head injury 2005   per pt residual resolved  . History of external beam radiation therapy    completed 10/ 2015 for prostate cancer  . History of gastric ulcer 2014  . History of kidney stones   . History of pneumothorax    spontaneous pneumo treated w/ chest tube  . History of rib fracture 07/2017   from fall,  right side 6th,7th,8th  . Neuropathy   . OAB (overactive bladder)   . OSA (obstructive sleep apnea)    Intolerant of CPAP  . Prostate cancer ([place])    dx 09-26-2011 via bx-- Stage T3b,N0,  Gleason 4+4, PSA 11.2 with METs to external iliac lymph node(resolved with ADT)-- treated w/ ADT ;   05/2013  staging work-up for rising PSA , started casodex and completed radiation therapy 10/ 2015;   rising PSA post treatment  . RLS (restless legs syndrome)   . Ureteral neocystostomy bleed    left side  . Urge urinary incontinence    Past Surgical History:  Procedure Laterality Date  . ABDOMINAL AORTIC ENDOVASCULAR STENT GRAFT  09-13-2007    dr [name]  [facility]   closure penetrating atherosclerotic ulcer of infarenal aorta with stent graft  . BIOPSY  03/06/2019   Procedure: BIOPSY;  Surgeon: [name], MD;  Location: [facility]  ENDO SUITE;  Service: Endoscopy;;  gastric  . CARDIAC CATHETERIZATION  2005   per pt normal (done in [place])  . CARDIAC CATHETERIZATION  10/06/2009    dr cooper   minimal nonobstructive CAD w/30% LAD otherwise normal coronaries, LVEDP 27mmHg  . CARDIOVASCULAR STRESS TEST  09/27/2009    dr Aundra Dubin   lexiscan nuclear study w/ moderate reversible inferior perfusion defect ischemia,  ef 60% (cardiac cath scheduled)  . CHEST TUBE INSERTION  1989   "collapsed lung"due to injury  . CIRCUMCISION  09/26/2011   Procedure: CIRCUMCISION ADULT;  Surgeon: [name], MD;  Location: [facility] ORS;  Service: Urology;  Laterality: N/A;  . COLONOSCOPY W/ POLYPECTOMY    . COLONOSCOPY WITH PROPOFOL N/A 04/27/2016    eight 4-8 mm polyps in descending colon, at splenic flexure, in ascending colon and cecum. Tubular adenomas. Surveillance in 3 years.  . CYSTOSCOPY  09/26/2011   Procedure: CYSTOSCOPY;  Surgeon: [name], MD;  Location: [facility]S;  Service: Urology;  Laterality: N/A;  . CYSTOSCOPY/RETROGRADE/URETEROSCOPY Left 09/27/2017   Procedure: CYSTOSCOPYLEFT Marlow Baars AND RENAL WASHINGS;  Surgeon: [name], MD;  Location[facility];  Service: Urology;  Laterality: Left;  . ESOPHAGOGASTRODUODENOSCOPY (EGD) WITH PROPOFOL N/A 04/27/2016   normal esophagus s/p dilation, small hiatal hernia, normal duodenum  . ESOPHAGOGASTRODUODENOSCOPY (EGD) WITH PROPOFOL N/A 03/06/2019   Procedure: ESOPHAGOGASTRODUODENOSCOPY (EGD) WITH PROPOFOL;  Surgeon: [name], MD;  Location[facility];  Service: Endoscopy;  Laterality: N/A;  2:30pm  . EXTRACORPOREAL SHOCK WAVE LITHOTRIPSY  yrs ago  . FRACTURE SURGERY  child   Bilateral lower arms   . KNEE ARTHROSCOPY Right 2013  . MALONEY DILATION N/A 04/27/2016   Procedure: Venia Minks DILATION;  Surgeon: [name], MD;  Location[facility];  Service: Endoscopy;  Laterality: N/A;  . Venia Minks DILATION N/A 03/06/2019   Procedure: Venia Minks DILATION;   Surgeon: [name], MD;  [facility];  Service: Endoscopy;  Laterality: N/A;  . PARTIAL KNEE ARTHROPLASTY Right 04/26/2015   Procedure: RIGHT KNEE MEDIAL UNICOMPARTMENTAL ARTHROPLASTY;  Surgeon: Gaynelle Arabian, MD;  Location: [facility];  Service: Orthopedics;  Laterality: Right;  . POLYPECTOMY  04/27/2016   Procedure: POLYPECTOMY;  Surgeon: [name], MD;  [facility];  Service: Endoscopy;;  cecal , ascending, descending, and sigmoid polypectomies  . PROSTATE BIOPSY  09/26/2011   Procedure: BIOPSY TRANSRECTAL ULTRASONIC PROSTATE (TUBP);  Surgeon: [name], MD;  Location: [facility];  Service: Urology;  Laterality: N/A;     . TRANSTHORACIC ECHOCARDIOGRAM  01-07-2013   dr [name]   ef 60-65%,  grade 1 diastolic dysfunction/  mild AR without stenosis/  mild LAE/  mild to moderate calcificed MV annulus without regurg. or stenosis/  . UMBILICAL HERNIA REPAIR  yrs ago     Current Meds  Medication Sig  . allopurinol (ZYLOPRIM) 300 MG tablet Take 1  tablet (300 mg total) by mouth daily.  [name] amLODipine (NORVASC) 10 MG tablet Take 1 tablet (10 mg total) by mouth daily. TAKE 1 TABLET BY MOUTH ONCE DAILY (NEEDS  TO  BE  SEEN  BEFORE  NEXT  REFILL)  . carvedilol (COREG) 25 MG tablet Take 1 tablet (25 mg total) by mouth 2 (two) times daily.  . diclofenac Sodium (VOLTAREN) 1 % GEL Apply 2 g topically 4 (four) times daily.  . DULoxetine (CYMBALTA) 60 MG capsule TAKE 1 CAPSULE BY MOUTH ONCE DAILY IN THE EVENING  . gabapentin (NEURONTIN) 100 MG capsule Take 1 capsule (100 mg total) by mouth 3 (three) times daily. (Patient taking differently: Take 100 mg by mouth at bedtime. )  . losartan (COZAAR) 50 MG tablet Take 1 tablet (50 mg total) by mouth daily.  . pantoprazole (PROTONIX) 40 MG tablet Take 1 tablet (40 mg total) by mouth 2 (two) times daily.  . tamsulosin (FLOMAX) 0.4 MG CAPS capsule Take 1 capsule by mouth once daily  . tolterodine (DETROL LA) 4 MG 24 hr capsule Take 4 mg by  mouth daily.  . [DISCONTINUED] pravastatin (PRAVACHOL) 20 MG tablet Take 1 tablet (20 mg total) by mouth daily.     Allergies:   Carbidopa-levodopa, Morphine sulfate, Sulfa antibiotics, and Sulfacetamide sodium   ROS:   Chronic pain.   Prior CV studies:   The following studies were reviewed today:  Echocardiogram 01/16/2018: Study Conclusions   - Procedure narrative: Transthoracic echocardiography. Image  quality was suboptimal.  - Left ventricle: The cavity size was normal. Wall thickness was  increased in a pattern of moderate LVH. Systolic function was  vigorous. The estimated ejection fraction was in the range of 65%  to 70%. Wall motion was normal; there were no regional wall  motion abnormalities. Doppler parameters are consistent with  abnormal left ventricular relaxation (grade 1 diastolic  dysfunction). Doppler parameters are consistent with  indeterminate ventricular filling pressure.  - Mitral valve: Mildly calcified annulus.  - Left atrium: The atrium was mildly dilated.   Chest CT 08/12/2018: FINDINGS: Cardiovascular: The heart is normal in size. No pericardial effusion.  No evidence of thoracic aortic aneurysm. Atherosclerotic calcifications of the aortic root/arch.  Coronary atherosclerosis the LAD and left circumflex.  Mediastinum/Nodes: Calcified mediastinal and right perihilar nodes, benign.  Visualized thyroid is notable for a 14 mm left inferior thyroid nodule.  Lungs/Pleura: 6 x 8 mm anterior right lung nodule (series 6/image 38), unchanged. 3 mm subpleural nodule along the right minor fissure (series 8/image 70), unchanged. Calcified granuloma in the medial right lower lobe (series 8/image 98), benign.  Left lung is essentially clear, noting a 2 mm subpleural nodule in the anterior left upper lobe (series 8/image 44).  Mild centrilobular and paraseptal emphysematous changes, left upper lobe predominant.  No focal  consolidation.  No pleural effusion or pneumothorax.  Upper Abdomen: Visualized upper abdomen is grossly unremarkable.  Musculoskeletal: Degenerative changes of the visualized thoracolumbar spine.  Multiple healed right rib fracture deformities.  IMPRESSION: Stable bilateral pulmonary nodules, including a dominant 7 mm (mean diameter) nodule in the right upper lobe. Four month stability has been demonstrated. Fleischner Society guidelines do not apply in the setting of known malignancy. Consider follow-up CT chest in 6-12 months.  Aortic Atherosclerosis (ICD10-I70.0) and Emphysema (ICD10-J43.9).  Labs/Other Tests and Data Reviewed:    EKG:  An ECG dated 09/26/2017 was personally reviewed today and demonstrated:  Sinus rhythm with prolonged PR interval and IVCD.  Recent Labs: 06/16/2019: ALT 12; BUN 24; Creatinine, Ser 2.16; Hemoglobin 11.7; Platelets 234; Potassium 3.9; Sodium 139; TSH 3.370   Recent Lipid Panel Lab Results  Component Value Date/Time   CHOL 197 06/16/2019 01:51 PM   CHOL 188 10/15/2012 01:20 PM   TRIG 155 (H) 06/16/2019 01:51 PM   TRIG 201 (H) 10/15/2012 01:20 PM   HDL 45 06/16/2019 01:51 PM   HDL 36 (L) 10/15/2012 01:20 PM   CHOLHDL 4.4 06/16/2019 01:51 PM   CHOLHDL 5 11/25/2009 08:37 AM   LDLCALC 124 (H) 06/16/2019 01:51 PM   LDLCALC 112 (H) 10/15/2012 01:20 PM   LDLDIRECT 132.2 11/25/2009 08:37 AM    Wt Readings from Last 3 Encounters:  07/03/19 282 lb (127.9 kg)  06/16/19 282 lb (127.9 kg)  03/05/19 272 lb (123.4 kg)     Objective:    Vital Signs:  BP 139/83   Pulse 73   Ht 5\' 8"  (1.727 m)   Wt 282 lb (127.9 kg)   BMI 42.88 kg/m    Patient spoke in full sentences, not short of breath. No audible wheezing or coughing.  ASSESSMENT & PLAN:    1.  Mild coronary atherosclerosis by previous evaluation, plan to continue medical therapy and observation in the absence of angina symptoms.  Continue Coreg, Norvasc, Cozaar, and change statin  therapy to Crestor for more aggressive lipid lowering.  2.  Mixed hyperlipidemia, recent LDL 124 on Pravachol 20 mg daily.  Switch to Crestor 20 mg daily.  Check FLP and LFTs in 3 months.  3.  Essential hypertension, systolic in the 709G today.  No changes made to current regimen.  Keep follow-up with Dr. Warrick Parisian.   Time:   Today, I have spent 6 minutes with the patient with telehealth technology discussing the above problems.     Medication Adjustments/Labs and Tests Ordered: Current medicines are reviewed at length with the patient today.  Concerns regarding medicines are outlined above.   Tests Ordered: Orders Placed This Encounter  Procedures  . Lipid Profile  . Hepatic function panel    Medication Changes: Meds ordered this encounter  Medications  . rosuvastatin (CRESTOR) 20 MG tablet    Sig: Take 1 tablet (20 mg total) by mouth daily.    Dispense:  90 tablet    Refill:  3    07/03/19 pravachol stopped    Follow Up:  In Person 1 year in the [place] office.  Signed, Rozann Lesches, MD  07/03/2019 2:44 PM    [place]

## 2019-07-04 LAB — URINE CULTURE

## 2019-07-07 ENCOUNTER — Encounter: Payer: Self-pay | Admitting: Gastroenterology

## 2019-07-07 NOTE — Progress Notes (Signed)
Referring Provider: [name], [name], MD Primary Care Physician:  [name], [name], MD  Primary GI: Dr. Gala Romney   Chief Complaint  Patient presents with  . Dysphagia    pp f/u, doing ok    HPI:   [name] is a 74 y.o. male presenting today with a history of dysphagia, s/p EGD Dec 2020 with erosive reflux esophagitis, s/p dilation, normal duodenum, abnormal gastric mucosa s/p biopsy. Hyperplastic gastric polyp. No H.pylori. Due to history of multiple polyps, 3 year surveillance colonoscopy due now. Last in 2018.   Urinary frequency, small amounts. Protonix BID. Wants to wait until the fall for colonoscopy. No melena or hematochezia. No abdominal pain. Has good appetite. No N/V. Dysphagia resolved. Has several other doctor appts and wants to hold off on colonoscopy until he completes these.   Past Medical History:  Diagnosis Date  . Aortic atherosclerosis ([place])    2004  s/p  closure Penetrating atherosclerotic ulcer of infarenal aorta w/ endovascular stent graft  . Arthritis   . CKD (chronic kidney disease), stage III   . Closed fracture of head of humerus 07/2017   right shoulder from fall; 09-25-2017 per pt no surgical intervention, only wore sling, intermittant pain  . Coronary atherosclerosis of native coronary artery    positive myoview for ischemia 09-27-2009;  10-01-2009 per cardiac cath-- minimal nonobstructive CAD w/ 30% LAD   . ED (erectile dysfunction)   . Emphysema/COPD [facility])    followed by pcp--- last exacerbation 09-12-2017;  09-25-2017 per pt no cough, sob or congestion  . Essential hypertension   . First degree heart block   . Frequency of urination   . GERD (gastroesophageal reflux disease)   . Gout    09-25-2017  per pt stable,  last episode 2015  . Heart murmur   . History of adenomatous polyp of colon   . History of closed head injury 2005   per pt residual resolved  . History of external beam radiation therapy    completed 10/ 2015  for prostate cancer  . History of gastric ulcer 2014  . History of kidney stones   . History of pneumothorax    spontaneous pneumo treated w/ chest tube  . History of rib fracture 07/2017   from fall,  right side 6th,7th,8th  . Neuropathy   . OAB (overactive bladder)   . OSA (obstructive sleep apnea)    Intolerant of CPAP  . Prostate cancer ([place])    dx 09-26-2011 via bx-- Stage T3b,N0,  Gleason 4+4, PSA 11.2 with METs to external iliac lymph node(resolved with ADT)-- treated w/ ADT ;   05/2013 staging work-up for rising PSA , started casodex and completed radiation therapy 10/ 2015;   rising PSA post treatment  . RLS (restless legs syndrome)   . Ureteral neocystostomy bleed    left side  . Urge urinary incontinence     Past Surgical History:  Procedure Laterality Date  . ABDOMINAL AORTIC ENDOVASCULAR STENT GRAFT  09-13-2007    dr [name]  [facility]   closure penetrating atherosclerotic ulcer of infarenal aorta with stent graft  . BIOPSY  03/06/2019   Procedure: BIOPSY;  Surgeon: [name], MD;  [facility];  Service: Endoscopy;;  gastric  . CARDIAC CATHETERIZATION  2005   per pt normal (done in [place])  . CARDIAC CATHETERIZATION  10/06/2009    dr cooper   minimal nonobstructive CAD w/30% LAD otherwise normal coronaries, LVEDP  38mmHg  . CARDIOVASCULAR STRESS TEST  09/27/2009    dr Aundra Dubin   lexiscan nuclear study w/ moderate reversible inferior perfusion defect ischemia,  ef 60% (cardiac cath scheduled)  . CHEST TUBE INSERTION  1989   "collapsed lung"due to injury  . CIRCUMCISION  09/26/2011   Procedure: CIRCUMCISION ADULT;  Surgeon: [name], MD;  Location: [facility];  Service: Urology;  Laterality: N/A;  . COLONOSCOPY W/ POLYPECTOMY    . COLONOSCOPY WITH PROPOFOL N/A 04/27/2016    eight 4-8 mm polyps in descending colon, at splenic flexure, in ascending colon and cecum. Tubular adenomas. Surveillance in 3 years.  . CYSTOSCOPY  09/26/2011   Procedure: CYSTOSCOPY;   Surgeon: [name], MD;  [facility];  Service: Urology;  Laterality: N/A;  . CYSTOSCOPY/RETROGRADE/URETEROSCOPY Left 09/27/2017   Procedure: CYSTOSCOPYLEFT Marlow Baars AND RENAL WASHINGS;  Surgeon: [name], MD;  Location: [facility];  Service: Urology;  Laterality: Left;  . ESOPHAGOGASTRODUODENOSCOPY (EGD) WITH PROPOFOL N/A 04/27/2016   normal esophagus s/p dilation, small hiatal hernia, normal duodenum  . ESOPHAGOGASTRODUODENOSCOPY (EGD) WITH PROPOFOL N/A 03/06/2019   erosive reflux esophagitis, s/p dilation, normal duodenum, abnormal gastric mucosa s/p biopsy. Hyperplastic gastric polyp. No H.pylori.   [name] EXTRACORPOREAL SHOCK WAVE LITHOTRIPSY  yrs ago  . FRACTURE SURGERY  child   Bilateral lower arms   . KNEE ARTHROSCOPY Right 2013  . MALONEY DILATION N/A 04/27/2016   Procedure: Venia Minks DILATION;  Surgeon: [name], MD;  [facility];  Service: Endoscopy;  Laterality: N/A;  . Venia Minks DILATION N/A 03/06/2019   Procedure: Venia Minks DILATION;  Surgeon: [name], MD;  [facility];  Service: Endoscopy;  Laterality: N/A;  . PARTIAL KNEE ARTHROPLASTY Right 04/26/2015   Procedure: RIGHT KNEE MEDIAL UNICOMPARTMENTAL ARTHROPLASTY;  Surgeon: Gaynelle Arabian, MD;  [facility];  Service: Orthopedics;  Laterality: Right;  . POLYPECTOMY  04/27/2016   Procedure: POLYPECTOMY;  Surgeon: [name], MD;  [facility];  Service: Endoscopy;;  cecal , ascending, descending, and sigmoid polypectomies  . PROSTATE BIOPSY  09/26/2011   Procedure: BIOPSY TRANSRECTAL ULTRASONIC PROSTATE (TUBP);  Surgeon: [name], MD;  [facility];  Service: Urology;  Laterality: N/A;     . TRANSTHORACIC ECHOCARDIOGRAM  01-07-2013   dr [name]   ef 60-65%,  grade 1 diastolic dysfunction/  mild AR without stenosis/  mild LAE/  mild to moderate calcificed MV annulus without regurg. or stenosis/  . UMBILICAL HERNIA REPAIR  yrs ago    Current  Outpatient Medications  Medication Sig Dispense Refill  . allopurinol (ZYLOPRIM) 300 MG tablet Take 1 tablet (300 mg total) by mouth daily. 90 tablet 3  . amLODipine (NORVASC) 10 MG tablet Take 1 tablet (10 mg total) by mouth daily. TAKE 1 TABLET BY MOUTH ONCE DAILY (NEEDS  TO  BE  SEEN  BEFORE  NEXT  REFILL) 90 tablet 3  . carvedilol (COREG) 25 MG tablet Take 1 tablet (25 mg total) by mouth 2 (two) times daily. 180 tablet 3  . DULoxetine (CYMBALTA) 60 MG capsule TAKE 1 CAPSULE BY MOUTH ONCE DAILY IN THE EVENING 90 capsule 0  . gabapentin (NEURONTIN) 100 MG capsule Take 1 capsule (100 mg total) by mouth 3 (three) times daily. (Patient taking differently: Take 100 mg by mouth at bedtime. ) 90 capsule 3  . losartan (COZAAR) 50 MG tablet Take 1 tablet (50 mg total) by mouth daily. 90 tablet 3  .  pantoprazole (PROTONIX) 40 MG tablet Take 1 tablet (40 mg total) by mouth 2 (two) times daily. 180 tablet 1  . rosuvastatin (CRESTOR) 20 MG tablet Take 1 tablet (20 mg total) by mouth daily. 90 tablet 3  . tolterodine (DETROL LA) 4 MG 24 hr capsule Take 4 mg by mouth daily.    . diclofenac Sodium (VOLTAREN) 1 % GEL Apply 2 g topically 4 (four) times daily. (Patient not taking: Reported on 07/08/2019) 350 g 3  . tamsulosin (FLOMAX) 0.4 MG CAPS capsule Take 1 capsule by mouth once daily (Patient not taking: Reported on 07/08/2019) 90 capsule 0   No current facility-administered medications for this visit.    Allergies as of 07/08/2019 - Review Complete 07/08/2019  Allergen Reaction Noted  . Carbidopa-levodopa Other (See Comments) 01/14/2013  . Morphine sulfate Nausea And Vomiting 09/28/2008  . Sulfa antibiotics Nausea And Vomiting 01/31/2007  . Sulfacetamide sodium Nausea And Vomiting 01/31/2007    Family History  Problem Relation Age of Onset  . Stroke Mother   . Alzheimer's disease Mother 4       probably due to head injury  . Mesothelioma Father   . Cancer Father        mesothelioma  .  Hyperlipidemia Sister   . Heart attack Sister   . Cancer Brother 67  . Heart disease Brother   . Hyperlipidemia Brother   . Cancer Brother        kidney  . Healthy Daughter   . Healthy Son   . Healthy Son   . Colon cancer Neg Hx     Social History   Socioeconomic History  . Marital status: Married    Spouse name: Not on file  . Number of children: 3  . Years of education: 34  . Highest education level: Some college, no degree  Occupational History  . Occupation: Retired    Comment: Hotel manager    Comment: Part Time at [employer]  . Smoking status: Current Every Day Smoker    Packs/day: 0.50    Years: 42.00    Pack years: 21.00    Types: Cigarettes  . Smokeless tobacco: Never Used  Substance and Sexual Activity  . Alcohol use: No    Alcohol/week: 0.0 standard drinks  . Drug use: No  . Sexual activity: Yes    Birth control/protection: None  Other Topics Concern  . Not on file  Social History Narrative   Patient is retired from the department of defense. He lives in a one story home with his wife. He moved from [place] about 9 years ago. They do not have family on the [place] and although he is very outgoing he feels socially isolated. He isn't a member of any groups or churches.    Social Determinants of Health   Financial Resource Strain:   . Difficulty of Paying Living Expenses:   Food Insecurity:   . Worried About Charity fundraiser in the Last Year:   . Arboriculturist in the Last Year:   Transportation Needs:   . Film/video editor (Medical):   [name] Lack of Transportation (Non-Medical):   Physical Activity:   . Days of Exercise per Week:   . Minutes of Exercise per Session:   Stress:   . Feeling of Stress :   Social Connections:   . Frequency of Communication with Friends and Family:   . Frequency of Social Gatherings with Friends and Family:   .  Attends Religious Services:   . Active Member of Clubs or Organizations:   .  Attends Archivist Meetings:   [name] Marital Status:     Review of Systems: Gen: Denies fever, chills, anorexia. Denies fatigue, weakness, weight loss.  CV: Denies chest pain, palpitations, syncope, peripheral edema, and claudication. Resp: Denies dyspnea at rest, cough, wheezing, coughing up blood, and pleurisy. GI: see HPI Derm: Denies rash, itching, dry skin Psych: Denies depression, anxiety, memory loss, confusion. No homicidal or suicidal ideation.  Heme: Denies bruising, bleeding, and enlarged lymph nodes.  Physical Exam: BP (!) 144/84   Pulse 65   Temp (!) 96.6 F (35.9 C) (Temporal)   Ht 5\' 8"  (1.727 m)   Wt 281 lb 3.2 oz (127.6 kg)   BMI 42.76 kg/m  General:   Alert and oriented. No distress noted. Pleasant and cooperative.  Head:  Normocephalic and atraumatic. Eyes:  Conjuctiva clear without scleral icterus. Abdomen:  +BS, soft, non-tender and non-distended. Obese. Limited exam as patient sitting in chair Msk:  Symmetrical without gross deformities. Normal posture. Extremities:  Without edema. Neurologic:  Alert and  oriented x4 Psych:  Alert and cooperative. Normal mood and affect.  ASSESSMENT: [name] is a 73 y.o. male presenting with history of dysphagia, GERD, colon polyps, undergoing EGD in interim.   Dysphagia: empiric dilation Dec 2020. Erosive reflux esophagitis on EGD. Protonix BID controlling symptoms well. Dysphagia resolved.   Colon polyps: multiple polyps in 2018 and due for surveillance now. He would like to postpone until the fall due to other medical issues. No concerning lower GI signs/symptoms.  PLAN:   Continue Protonix BID  Colonoscopy in fall 2021  Return Sept 2021  Annitta Needs, PhD, [facility] Gastroenterology

## 2019-07-08 ENCOUNTER — Encounter: Payer: Self-pay | Admitting: Gastroenterology

## 2019-07-08 ENCOUNTER — Other Ambulatory Visit: Payer: Self-pay

## 2019-07-08 ENCOUNTER — Ambulatory Visit (INDEPENDENT_AMBULATORY_CARE_PROVIDER_SITE_OTHER): Payer: Medicare Other | Admitting: Gastroenterology

## 2019-07-08 VITALS — BP 144/84 | HR 65 | Temp 96.6°F | Ht 68.0 in | Wt 281.2 lb

## 2019-07-08 DIAGNOSIS — K219 Gastro-esophageal reflux disease without esophagitis: Secondary | ICD-10-CM | POA: Diagnosis not present

## 2019-07-08 DIAGNOSIS — Z86010 Personal history of colonic polyps: Secondary | ICD-10-CM

## 2019-07-08 DIAGNOSIS — I25119 Atherosclerotic heart disease of native coronary artery with unspecified angina pectoris: Secondary | ICD-10-CM

## 2019-07-08 DIAGNOSIS — R1310 Dysphagia, unspecified: Secondary | ICD-10-CM | POA: Diagnosis not present

## 2019-07-08 DIAGNOSIS — Z23 Encounter for immunization: Secondary | ICD-10-CM | POA: Diagnosis not present

## 2019-07-08 MED ORDER — PANTOPRAZOLE SODIUM 40 MG PO TBEC: 40.0000 mg | DELAYED_RELEASE_TABLET | Freq: Two times a day (BID) | ORAL | 3 refills | Status: DC

## 2019-07-08 NOTE — Progress Notes (Signed)
Cc'ed to pcp °

## 2019-07-08 NOTE — Patient Instructions (Signed)
I am glad your swallowing is better!  I have refilled Protonix for you.  We will see you in fall 2021 to arrange colonoscopy.  Please call if any recurrent problems swallowing, blood in stool, changes in bowel habits, abdominal pain.  I enjoyed seeing you again today! As you know, I value our relationship and want to provide genuine, compassionate, and quality care. I welcome your feedback. If you receive a survey regarding your visit,  I greatly appreciate you taking time to fill this out. See you next time!  Annitta Needs, PhD, ANP-BC [facility] Gastroenterology

## 2019-07-09 ENCOUNTER — Other Ambulatory Visit: Payer: Self-pay | Admitting: Family Medicine

## 2019-07-09 DIAGNOSIS — C61 Malignant neoplasm of prostate: Secondary | ICD-10-CM

## 2019-07-09 DIAGNOSIS — R39198 Other difficulties with micturition: Secondary | ICD-10-CM

## 2019-07-09 DIAGNOSIS — N401 Enlarged prostate with lower urinary tract symptoms: Secondary | ICD-10-CM

## 2019-07-15 ENCOUNTER — Other Ambulatory Visit: Payer: Self-pay

## 2019-07-15 ENCOUNTER — Ambulatory Visit (INDEPENDENT_AMBULATORY_CARE_PROVIDER_SITE_OTHER): Payer: Medicare Other

## 2019-07-15 ENCOUNTER — Ambulatory Visit (INDEPENDENT_AMBULATORY_CARE_PROVIDER_SITE_OTHER): Payer: Medicare Other | Admitting: Family Medicine

## 2019-07-15 ENCOUNTER — Encounter: Payer: Self-pay | Admitting: Family Medicine

## 2019-07-15 VITALS — BP 136/83 | HR 68 | Temp 97.8°F | Ht 68.0 in | Wt 281.0 lb

## 2019-07-15 DIAGNOSIS — M25562 Pain in left knee: Secondary | ICD-10-CM

## 2019-07-15 DIAGNOSIS — W19XXXA Unspecified fall, initial encounter: Secondary | ICD-10-CM

## 2019-07-15 DIAGNOSIS — S8992XA Unspecified injury of left lower leg, initial encounter: Secondary | ICD-10-CM | POA: Diagnosis not present

## 2019-07-15 MED ORDER — TRAMADOL HCL 50 MG PO TABS: 50.0000 mg | ORAL_TABLET | Freq: Two times a day (BID) | ORAL | 0 refills | Status: AC | PRN

## 2019-07-15 NOTE — Progress Notes (Signed)
Subjective: CC: knee pain PCP: [name], [name], MD KDX:IPJASN E Stead is a 74 y.o. male presenting to clinic today for:  1. Knee pain Patient reports that he sustained a fall a couple of days ago.  He tripped over a coffee table and fell onto his left knee.  He notes that he has been having some pain and swelling in that left knee and left lower leg since.  Symptoms have been getting slightly better with application of ice, use of Motrin and elevation of the left knee but he wanted to make sure that he did not have any breaks or possible meniscal tears.  He has history of right-sided knee surgery.   ROS: Per HPI  Allergies  Allergen Reactions  . Carbidopa-Levodopa Other (See Comments)    hallunications  . Morphine Sulfate Nausea And Vomiting  . Sulfa Antibiotics Nausea And Vomiting  . Sulfacetamide Sodium Nausea And Vomiting   Past Medical History:  Diagnosis Date  . Aortic atherosclerosis ([place])    2004  s/p  closure Penetrating atherosclerotic ulcer of infarenal aorta w/ endovascular stent graft  . Arthritis   . CKD (chronic kidney disease), stage III   . Closed fracture of head of humerus 07/2017   right shoulder from fall; 09-25-2017 per pt no surgical intervention, only wore sling, intermittant pain  . Coronary atherosclerosis of native coronary artery    positive myoview for ischemia 09-27-2009;  10-01-2009 per cardiac cath-- minimal nonobstructive CAD w/ 30% LAD   . ED (erectile dysfunction)   . Emphysema/COPD [facility])    followed by pcp--- last exacerbation 09-12-2017;  09-25-2017 per pt no cough, sob or congestion  . Essential hypertension   . First degree heart block   . Frequency of urination   . GERD (gastroesophageal reflux disease)   . Gout    09-25-2017  per pt stable,  last episode 2015  . Heart murmur   . History of adenomatous polyp of colon   . History of closed head injury 2005   per pt residual resolved  . History of external beam radiation  therapy    completed 10/ 2015 for prostate cancer  . History of gastric ulcer 2014  . History of kidney stones   . History of pneumothorax    spontaneous pneumo treated w/ chest tube  . History of rib fracture 07/2017   from fall,  right side 6th,7th,8th  . Neuropathy   . OAB (overactive bladder)   . OSA (obstructive sleep apnea)    Intolerant of CPAP  . Prostate cancer ([place])    dx 09-26-2011 via bx-- Stage T3b,N0,  Gleason 4+4, PSA 11.2 with METs to external iliac lymph node(resolved with ADT)-- treated w/ ADT ;   05/2013 staging work-up for rising PSA , started casodex and completed radiation therapy 10/ 2015;   rising PSA post treatment  . RLS (restless legs syndrome)   . Ureteral neocystostomy bleed    left side  . Urge urinary incontinence     Current Outpatient Medications:  .  allopurinol (ZYLOPRIM) 300 MG tablet, Take 1 tablet (300 mg total) by mouth daily., Disp: 90 tablet, Rfl: 3 .  amLODipine (NORVASC) 10 MG tablet, Take 1 tablet (10 mg total) by mouth daily. TAKE 1 TABLET BY MOUTH ONCE DAILY (NEEDS  TO  BE  SEEN  BEFORE  NEXT  REFILL), Disp: 90 tablet, Rfl: 3 .  carvedilol (COREG) 25 MG tablet, Take 1 tablet (25 mg total) by mouth 2 (two)  times daily., Disp: 180 tablet, Rfl: 3 .  DULoxetine (CYMBALTA) 60 MG capsule, TAKE 1 CAPSULE BY MOUTH ONCE DAILY IN THE EVENING, Disp: 90 capsule, Rfl: 0 .  gabapentin (NEURONTIN) 100 MG capsule, Take 1 capsule (100 mg total) by mouth 3 (three) times daily. (Patient taking differently: Take 100 mg by mouth at bedtime. ), Disp: 90 capsule, Rfl: 3 .  losartan (COZAAR) 50 MG tablet, Take 1 tablet (50 mg total) by mouth daily., Disp: 90 tablet, Rfl: 3 .  pantoprazole (PROTONIX) 40 MG tablet, Take 1 tablet (40 mg total) by mouth 2 (two) times daily before a meal., Disp: 180 tablet, Rfl: 3 .  rosuvastatin (CRESTOR) 20 MG tablet, Take 1 tablet (20 mg total) by mouth daily., Disp: 90 tablet, Rfl: 3 .  tamsulosin (FLOMAX) 0.4 MG CAPS capsule, Take 1  capsule by mouth once daily, Disp: 90 capsule, Rfl: 0 .  tolterodine (DETROL LA) 4 MG 24 hr capsule, Take 4 mg by mouth daily., Disp: , Rfl:  Social History   Socioeconomic History  . Marital status: Married    Spouse name: Not on file  . Number of children: 3  . Years of education: 54  . Highest education level: Some college, no degree  Occupational History  . Occupation: Retired    Comment: Hotel manager    Comment: Part Time at [employer]  . Smoking status: Current Every Day Smoker    Packs/day: 0.50    Years: 42.00    Pack years: 21.00    Types: Cigarettes  . Smokeless tobacco: Never Used  Substance and Sexual Activity  . Alcohol use: No    Alcohol/week: 0.0 standard drinks  . Drug use: No  . Sexual activity: Yes    Birth control/protection: None  Other Topics Concern  . Not on file  Social History Narrative   Patient is retired from the department of defense. He lives in a one story home with his wife. He moved from [place] about 9 years ago. They do not have family on the [place] and although he is very outgoing he feels socially isolated. He isn't a member of any groups or churches.    Social Determinants of Health   Financial Resource Strain:   . Difficulty of Paying Living Expenses:   Food Insecurity:   . Worried About Charity fundraiser in the Last Year:   . Arboriculturist in the Last Year:   Transportation Needs:   . Film/video editor (Medical):   [name] Lack of Transportation (Non-Medical):   Physical Activity:   . Days of Exercise per Week:   . Minutes of Exercise per Session:   Stress:   . Feeling of Stress :   Social Connections:   . Frequency of Communication with Friends and Family:   . Frequency of Social Gatherings with Friends and Family:   . Attends Religious Services:   . Active Member of Clubs or Organizations:   . Attends Archivist Meetings:   [name] Marital Status:   Intimate Partner Violence:   . Fear of  Current or Ex-Partner:   . Emotionally Abused:   [name] Physically Abused:   . Sexually Abused:    Family History  Problem Relation Age of Onset  . Stroke Mother   . Alzheimer's disease Mother 21       probably due to head injury  . Mesothelioma Father   . Cancer Father  mesothelioma  . Hyperlipidemia Sister   . Heart attack Sister   . Cancer Brother 98  . Heart disease Brother   . Hyperlipidemia Brother   . Cancer Brother        kidney  . Healthy Daughter   . Healthy Son   . Healthy Son   . Colon cancer Neg Hx     Objective: Office vital signs reviewed. BP 136/83   Pulse 68   Temp 97.8 F (36.6 C)   Ht 5\' 8"  (1.727 m)   Wt 281 lb (127.5 kg)   SpO2 97%   BMI 42.73 kg/m   Physical Examination:  General: Awake, alert, morbidly obese, No acute distress MSK: antalgic gait. Walks with walker  Left knee: Limited active range of motion secondary to pain and swelling.  He has a quarter size abrasion along the left anterior knee.  This is hemostatic.  No evidence of secondary infection.  There is moderate soft tissue swelling noted with a mild joint effusion as well.  Patient is ambulatory but clearly has some pain with ambulation.  DG Knee 1-2 Views Left  Result Date: 07/15/2019 CLINICAL DATA:  Left knee pain after fall. EXAM: LEFT KNEE - 1-2 VIEW COMPARISON:  None. FINDINGS: Subtle degenerative changes over the left medial compartment and patellofemoral joints. No evidence of acute fracture or dislocation. No significant joint effusion. Hemiarthroplasty intact over the medial right knee joint. IMPRESSION: No acute findings. Electronically Signed   By: Marin Olp M.D.   On: 07/15/2019 13:10   Assessment/ Plan: 74 y.o. male   1. Acute pain of left knee X-rays were obtained given degree of swelling and history for right knee surgery.  Personal review of images did not show any fractures or acute dislocations.  I suspect that this is likely related to soft tissue injury.   Since symptoms are improving I recommended continuing ice, elevation.  I have advised totally against use of NSAIDs given renal impairment.  I reemphasized this with the patient.  I have placed him on a small amount of tramadol to have on hand if needed for severe pain.  We discussed that if symptoms not improving or abruptly worsen he is to seek immediate medical attention.  He voiced understanding of follow-up as needed - DG Knee 1-2 Views Left; Future  2. Fall, initial encounter - DG Knee 1-2 Views Left; Future   No orders of the defined types were placed in this encounter.  No orders of the defined types were placed in this encounter.  The Narcotic Database has been reviewed.  There were no red flags.   Janora Norlander, DO [place] [phone]

## 2019-07-15 NOTE — Patient Instructions (Addendum)
I personally reviewed your x-ray I do not see any acute fractures or dislocations.  We discussed that there is still possibility for soft tissue injury including meniscus.  If your symptoms do not gradually continue to improve with elevation of your knee, ice, and staying off of it I want you to contact us and we will get you back to your knee surgeon.  I am giving you a small amount of tramadol to have on hand for severe pain.  We discussed that this can cause drowsiness and should only be used when you will not be getting up and around.  I absolutely DO NOT want you using any Advil, ibuprofen, Motrin, Aleve or naproxen.  Your kidney function is not good and this will worsen your kidney function.  Tylenol is fine to use.  Contusion A contusion is a deep bruise. This is a result of an injury that causes bleeding under the skin. Symptoms of bruising include pain, swelling, and discolored skin. The skin may turn blue, purple, or yellow. Follow these instructions at home: Managing pain, stiffness, and swelling You may use RICE. This stands for:  Resting.  Icing.  Compression, or putting pressure.  Elevating, or raising the injured area. To follow this method, do these actions:  Rest the injured area.  If told, put ice on the injured area. ? Put ice in a plastic bag. ? Place a towel between your skin and the bag. ? Leave the ice on for 20 minutes, 2-3 times per day.  If told, put light pressure (compression) on the injured area using an elastic bandage. Make sure the bandage is not too tight. If the area tingles or becomes numb, remove it and put it back on as told by your doctor.  If possible, raise (elevate) the injured area above the level of your heart while you are sitting or lying down.  General instructions  Take over-the-counter and prescription medicines only as told by your doctor.  Keep all follow-up visits as told by your doctor. This is important. Contact a doctor  if:  Your symptoms do not get better after several days of treatment.  Your symptoms get worse.  You have trouble moving the injured area. Get help right away if:  You have very bad pain.  You have a loss of feeling (numbness) in a hand or foot.  Your hand or foot turns pale or cold. Summary  A contusion is a deep bruise. This is a result of an injury that causes bleeding under the skin.  Symptoms of bruising include pain, swelling, and discolored skin. The skin may turn blue, purple, or yellow.  This condition is treated with rest, ice, compression, and elevation. This is also called RICE. You may be given over-the-counter medicines for pain.  Contact a doctor if you do not feel better, or you feel worse. Get help right away if you have very bad pain, have lost feeling in a hand or foot, or the area turns pale or cold. This information is not intended to replace advice given to you by your health care provider. Make sure you discuss any questions you have with your health care provider. Document Revised: 11/09/2017 Document Reviewed: 11/09/2017 Elsevier Patient Education  [place].

## 2019-07-17 NOTE — Progress Notes (Signed)
Subjective:  1. Prostate cancer ([place])   2. Rising PSA following treatment for malignant neoplasm of prostate   3. Urge incontinence   4. Nocturia   5. Microhematuria      I have prostate cancer. HPI: [name] is a 74 year-old male established patient who is here evaluation for treatment of prostate cancer.  His prostate cancer was diagnosed approximately 12/03/2011. He does have the pathology report from his biopsy. His most recent PSA was 1.3.   He didn't get one prior to this visit.   He has undergone External Beam Radiation Therapy for treatment. He has undergone Hormonal Therapy for treatment and last had Lupron 45mg  IM on 11/29/18.[name]   He does have urinary incontinence. He does have problems with erectile dysfunction. He has not recently had unwanted weight loss. He is not having pain in new locations.   [name] returns in f/u for his locally recurrent prostate cancer now on ADT. His PSA was up to 1.3 on ADT with a testosterone of <10 in 12/20.  He hasn't gotten labs prior to this visit.   A PET prior to initiation of the firmagon for a rising PSA that increased to 4.0 with a <3 month PSADT showed local uptake on the PET in the prostate with no mets. He had some pulmonary nodules and a CHest was obtained and 3-6 mo f/u imaging was recommended. The CT Chest was done on 08/12/18 and f/u in 6-12 months was recommended. He is doing well without hot flashes or other complaints. He continues to have nocturia q1.5hrs which may be somewhat better since his PCP added tamsulosin. He has some daytime frequency q2-3hrs. He has intermittency and a sensation of incomplete emptying. He has a variable stream. His IPSS is 21.  He has CKD of 2.16 on recent labs.  His UA had 3-10 RBC's on 4/1.   A renal US in 8/20 didn't show any stones.  He has cysts and some atrophy.   He has a history of T3 N1M0 Gleason 8 prostate cancer with metastatic adenopathy in the right iliac chain. He was diagnosed on  09/26/11. He completed radiation therapy in late October 2015. He has been off of the ADT since 3/18 and his PSA has been rising and is up to 4.0 from 1.6 in 9/19 and from 0.81 on 09/24/17 with a normalized testosterone.   He had cystoscopy and ureteroscopy in June 2019 for gross hematuria from the left collecting system. He has no associated signs or symptoms.    IPSS    Row Name 07/18/19 1000         International Prostate Symptom Score   How often have you had the sensation of not emptying your bladder?  About half the time     How often have you had to urinate less than every two hours?  More than half the time     How often have you found you stopped and started again several times when you urinated?  Less than 1 in 5 times     How often have you found it difficult to postpone urination?  More than half the time     How often have you had a weak urinary stream?  More than half the time     How often have you had to strain to start urination?  Not at All     How many times did you typically get up at night to urinate?  5 Times  Total IPSS Score  21       Quality of Life due to urinary symptoms   If you were to spend the rest of your life with your urinary condition just the way it is now how would you feel about that?  Mixed         ROS:  ROS:  A complete review of systems was performed.  All systems are negative except for pertinent findings as noted.   Review of Systems  Genitourinary: Positive for frequency and urgency.  Musculoskeletal: Positive for back pain.  Neurological: Positive for dizziness.  Psychiatric/Behavioral: Positive for depression.  All other systems reviewed and are negative.   Allergies  Allergen Reactions  . Carbidopa-Levodopa Other (See Comments)    hallunications  . Morphine Sulfate Nausea And Vomiting  . Sulfa Antibiotics Nausea And Vomiting  . Sulfacetamide Sodium Nausea And Vomiting    Outpatient Encounter Medications as of 07/18/2019   Medication Sig  . allopurinol (ZYLOPRIM) 300 MG tablet Take 1 tablet (300 mg total) by mouth daily.  [name] amLODipine (NORVASC) 10 MG tablet Take 1 tablet (10 mg total) by mouth daily. TAKE 1 TABLET BY MOUTH ONCE DAILY (NEEDS  TO  BE  SEEN  BEFORE  NEXT  REFILL)  . carvedilol (COREG) 25 MG tablet Take 1 tablet (25 mg total) by mouth 2 (two) times daily.  . DULoxetine (CYMBALTA) 60 MG capsule TAKE 1 CAPSULE BY MOUTH ONCE DAILY IN THE EVENING  . gabapentin (NEURONTIN) 100 MG capsule Take 1 capsule (100 mg total) by mouth 3 (three) times daily. (Patient taking differently: Take 100 mg by mouth at bedtime. )  . losartan (COZAAR) 50 MG tablet Take 1 tablet (50 mg total) by mouth daily.  . rosuvastatin (CRESTOR) 20 MG tablet Take 1 tablet (20 mg total) by mouth daily.  . tamsulosin (FLOMAX) 0.4 MG CAPS capsule Take 1 capsule by mouth once daily  . traMADol (ULTRAM) 50 MG tablet Take 1 tablet (50 mg total) by mouth every 12 (twelve) hours as needed for up to 5 days for severe pain.  . [EXPIRED] Leuprolide Acetate (6 Month) (LUPRON) injection 45 mg    No facility-administered encounter medications on file as of 07/18/2019.    Past Medical History:  Diagnosis Date  . Aortic atherosclerosis ([place])    2004  s/p  closure Penetrating atherosclerotic ulcer of infarenal aorta w/ endovascular stent graft  . Arthritis   . CKD (chronic kidney disease), stage III   . Closed fracture of head of humerus 07/2017   right shoulder from fall; 09-25-2017 per pt no surgical intervention, only wore sling, intermittant pain  . Coronary atherosclerosis of native coronary artery    positive myoview for ischemia 09-27-2009;  10-01-2009 per cardiac cath-- minimal nonobstructive CAD w/ 30% LAD   . ED (erectile dysfunction)   . Emphysema/COPD [facility])    followed by pcp--- last exacerbation 09-12-2017;  09-25-2017 per pt no cough, sob or congestion  . Essential hypertension   . First degree heart block   . Frequency of  urination   . GERD (gastroesophageal reflux disease)   . Gout    09-25-2017  per pt stable,  last episode 2015  . Heart murmur   . History of adenomatous polyp of colon   . History of closed head injury 2005   per pt residual resolved  . History of external beam radiation therapy    completed 10/ 2015 for prostate cancer  . History of gastric ulcer  2014  . History of kidney stones   . History of pneumothorax    spontaneous pneumo treated w/ chest tube  . History of rib fracture 07/2017   from fall,  right side 6th,7th,8th  . Neuropathy   . OAB (overactive bladder)   . OSA (obstructive sleep apnea)    Intolerant of CPAP  . Prostate cancer ([place])    dx 09-26-2011 via bx-- Stage T3b,N0,  Gleason 4+4, PSA 11.2 with METs to external iliac lymph node(resolved with ADT)-- treated w/ ADT ;   05/2013 staging work-up for rising PSA , started casodex and completed radiation therapy 10/ 2015;   rising PSA post treatment  . RLS (restless legs syndrome)   . Ureteral neocystostomy bleed    left side  . Urge urinary incontinence     Past Surgical History:  Procedure Laterality Date  . ABDOMINAL AORTIC ENDOVASCULAR STENT GRAFT  09-13-2007    dr [name]  [facility]   closure penetrating atherosclerotic ulcer of infarenal aorta with stent graft  . BIOPSY  03/06/2019   Procedure: BIOPSY;  Surgeon: [name], MD;  Location[facility];  Service: Endoscopy;;  gastric  . CARDIAC CATHETERIZATION  2005   per pt normal (done in [place])  . CARDIAC CATHETERIZATION  10/06/2009    dr cooper   minimal nonobstructive CAD w/30% LAD otherwise normal coronaries, LVEDP 24mmHg  . CARDIOVASCULAR STRESS TEST  09/27/2009    dr Aundra Dubin   lexiscan nuclear study w/ moderate reversible inferior perfusion defect ischemia,  ef 60% (cardiac cath scheduled)  . CHEST TUBE INSERTION  1989   "collapsed lung"due to injury  . CIRCUMCISION  09/26/2011   Procedure: CIRCUMCISION ADULT;  Surgeon: [name], MD;  Location: [facility]  ORS;  Service: Urology;  Laterality: N/A;  . COLONOSCOPY W/ POLYPECTOMY    . COLONOSCOPY WITH PROPOFOL N/A 04/27/2016    eight 4-8 mm polyps in descending colon, at splenic flexure, in ascending colon and cecum. Tubular adenomas. Surveillance in 3 years.  . CYSTOSCOPY  09/26/2011   Procedure: CYSTOSCOPY;  Surgeon: [name], MD;  Location: [facility] ORS;  Service: Urology;  Laterality: N/A;  . CYSTOSCOPY/RETROGRADE/URETEROSCOPY Left 09/27/2017   Procedure: CYSTOSCOPYLEFT Marlow Baars AND RENAL WASHINGS;  Surgeon: [name], MD;  Location: [facility];  Service: Urology;  Laterality: Left;  . ESOPHAGOGASTRODUODENOSCOPY (EGD) WITH PROPOFOL N/A 04/27/2016   normal esophagus s/p dilation, small hiatal hernia, normal duodenum  . ESOPHAGOGASTRODUODENOSCOPY (EGD) WITH PROPOFOL N/A 03/06/2019   erosive reflux esophagitis, s/p dilation, normal duodenum, abnormal gastric mucosa s/p biopsy. Hyperplastic gastric polyp. No H.pylori.   [name] EXTRACORPOREAL SHOCK WAVE LITHOTRIPSY  yrs ago  . FRACTURE SURGERY  child   Bilateral lower arms   . KNEE ARTHROSCOPY Right 2013  . MALONEY DILATION N/A 04/27/2016   Procedure: Venia Minks DILATION;  Surgeon: [name], MD;  [facility];  Service: Endoscopy;  Laterality: N/A;  . Venia Minks DILATION N/A 03/06/2019   Procedure: Venia Minks DILATION;  Surgeon: [name], MD;  [facility];  Service: Endoscopy;  Laterality: N/A;  . PARTIAL KNEE ARTHROPLASTY Right 04/26/2015   Procedure: RIGHT KNEE MEDIAL UNICOMPARTMENTAL ARTHROPLASTY;  Surgeon: Gaynelle Arabian, MD;  Location: [facility];  Service: Orthopedics;  Laterality: Right;  . POLYPECTOMY  04/27/2016   Procedure: POLYPECTOMY;  Surgeon: [name], MD;  [facility];  Service: Endoscopy;;  cecal , ascending, descending, and sigmoid polypectomies  . PROSTATE BIOPSY  09/26/2011   Procedure:  BIOPSY TRANSRECTAL ULTRASONIC PROSTATE (TUBP);  Surgeon: [name], MD;  Location:  [facility];  Service: Urology;  Laterality: N/A;     . TRANSTHORACIC ECHOCARDIOGRAM  01-07-2013   dr [name]   ef 60-65%,  grade 1 diastolic dysfunction/  mild AR without stenosis/  mild LAE/  mild to moderate calcificed MV annulus without regurg. or stenosis/  . UMBILICAL HERNIA REPAIR  yrs ago    Social History   Socioeconomic History  . Marital status: Married    Spouse name: Not on file  . Number of children: 3  . Years of education: 72  . Highest education level: Some college, no degree  Occupational History  . Occupation: Retired    Comment: Hotel manager    Comment: Part Time at [employer]  . Smoking status: Current Every Day Smoker    Packs/day: 0.50    Years: 42.00    Pack years: 21.00    Types: Cigarettes  . Smokeless tobacco: Never Used  Substance and Sexual Activity  . Alcohol use: No    Alcohol/week: 0.0 standard drinks  . Drug use: No  . Sexual activity: Yes    Birth control/protection: None  Other Topics Concern  . Not on file  Social History Narrative   Patient is retired from the [employer]. He lives in a one story home with his wife. He moved from [facility] [place] about 9 years ago. They do not have family on the [place] and although he is very outgoing he feels socially isolated. He isn't a member of any groups or churches.    Social Determinants of Health   Financial Resource Strain:   . Difficulty of Paying Living Expenses:   Food Insecurity:   . Worried About Charity fundraiser in the Last Year:   . Arboriculturist in the Last Year:   Transportation Needs:   . Film/video editor (Medical):   [name] Lack of Transportation (Non-Medical):   Physical Activity:   . Days of Exercise per Week:   . Minutes of Exercise per Session:   Stress:   . Feeling of Stress :   Social Connections:   . Frequency of Communication with Friends and Family:   . Frequency of Social Gatherings with Friends and Family:   . Attends Religious  Services:   . Active Member of Clubs or Organizations:   . Attends Archivist Meetings:   [name] Marital Status:   Intimate Partner Violence:   . Fear of Current or Ex-Partner:   . Emotionally Abused:   [name] Physically Abused:   . Sexually Abused:     Family History  Problem Relation Age of Onset  . Stroke Mother   . Alzheimer's disease Mother 82       probably due to head injury  . Mesothelioma Father   . Cancer Father        mesothelioma  . Hyperlipidemia Sister   . Heart attack Sister   . Cancer Brother 41  . Heart disease Brother   . Hyperlipidemia Brother   . Cancer Brother        kidney  . Healthy Daughter   . Healthy Son   . Healthy Son   . Colon cancer Neg Hx        Objective: Vitals:   07/18/19 0957  BP: (!) 151/82  Pulse: 76  Temp: 98.2 F (36.8 C)     Physical Exam Vitals reviewed.  Constitutional:      Appearance: Normal appearance.  Neurological:     Mental Status: He is alert.     Lab Results:  I have reviewed his recent UA and labs with results noted above.     Studies/Results: Abdominal US in 2022/11/26 reviewed.      Assessment & Plan: Prostate cancer with rising PSA on ADT.   He was given lupron today and will have labs.  If there is a significant PSA increase he will need restaging and consideration of second line therapy.    Microhematuria.  This is mild and has previously been evaluated.  Nocturia and Urge incontinence.   He has had some improvement with tamsulosin and will continue that.     Meds ordered this encounter  Medications  . Leuprolide Acetate (6 Month) (LUPRON) injection 45 mg     Orders Placed This Encounter  Procedures  . PSA    Standing Status:   Future    Standing Expiration Date:   07/17/2020  . Testosterone    Standing Status:   Future    Standing Expiration Date:   07/17/2020  . PSA    Standing Status:   Future    Standing Expiration Date:   07/17/2020  . Testosterone    Standing Status:   Future     Standing Expiration Date:   07/17/2020      Return in about 3 months (around 10/17/2019) for with PSA and testosterone. .   CC: [name], [name], MD      [name] 07/18/2019

## 2019-07-18 ENCOUNTER — Other Ambulatory Visit: Payer: Self-pay | Admitting: Urology

## 2019-07-18 ENCOUNTER — Other Ambulatory Visit: Payer: Self-pay

## 2019-07-18 ENCOUNTER — Ambulatory Visit (INDEPENDENT_AMBULATORY_CARE_PROVIDER_SITE_OTHER): Payer: Medicare Other | Admitting: Urology

## 2019-07-18 ENCOUNTER — Encounter: Payer: Self-pay | Admitting: Urology

## 2019-07-18 VITALS — BP 151/82 | HR 76 | Temp 98.2°F | Ht 68.0 in | Wt 281.0 lb

## 2019-07-18 DIAGNOSIS — C61 Malignant neoplasm of prostate: Secondary | ICD-10-CM | POA: Diagnosis not present

## 2019-07-18 DIAGNOSIS — N3941 Urge incontinence: Secondary | ICD-10-CM | POA: Diagnosis not present

## 2019-07-18 DIAGNOSIS — I25119 Atherosclerotic heart disease of native coronary artery with unspecified angina pectoris: Secondary | ICD-10-CM | POA: Diagnosis not present

## 2019-07-18 DIAGNOSIS — R3129 Other microscopic hematuria: Secondary | ICD-10-CM | POA: Diagnosis not present

## 2019-07-18 DIAGNOSIS — R9721 Rising PSA following treatment for malignant neoplasm of prostate: Secondary | ICD-10-CM

## 2019-07-18 DIAGNOSIS — R351 Nocturia: Secondary | ICD-10-CM | POA: Diagnosis not present

## 2019-07-18 LAB — TESTOSTERONE: Testosterone: <10 ng/dL — ABNORMAL LOW (ref 250–827)

## 2019-07-18 LAB — PSA: PSA: 3.2 ng/mL (ref <=4.0)

## 2019-07-18 MED ORDER — LEUPROLIDE ACETATE (6 MONTH) 45 MG IM KIT
45.0000 mg | PACK | Freq: Once | INTRAMUSCULAR | Status: AC
  Administered 2019-07-18: 45 mg via INTRAMUSCULAR

## 2019-07-21 ENCOUNTER — Telehealth: Payer: Self-pay | Admitting: Urology

## 2019-07-21 ENCOUNTER — Telehealth: Payer: Self-pay

## 2019-07-21 ENCOUNTER — Other Ambulatory Visit: Payer: Self-pay | Admitting: Urology

## 2019-07-21 DIAGNOSIS — C61 Malignant neoplasm of prostate: Secondary | ICD-10-CM

## 2019-07-21 DIAGNOSIS — R9721 Rising PSA following treatment for malignant neoplasm of prostate: Secondary | ICD-10-CM

## 2019-07-21 NOTE — Telephone Encounter (Signed)
Pt notified of results

## 2019-07-21 NOTE — Telephone Encounter (Signed)
Pt requests a nurse return his call regarding his referral to cancer ctr.

## 2019-07-21 NOTE — Telephone Encounter (Signed)
-----   Message from [name], MD sent at 07/21/2019 10:55 AM EDT ----- His PSA is up over 2 points in the past year from 1.3 to 3.   He needs restaging with an Axumin PET and a referral to [facility] oncology.  I will place the orders.  ----- Message ----- From: Dorisann Frames, RN Sent: 07/21/2019   8:45 AM EDT To: [name], MD  Please review

## 2019-07-22 NOTE — Telephone Encounter (Signed)
Discussed with pt again reason for another PET scan d/t increase of PSA and oncology referral. Pt had questions about oncology and what steps they would take. I encouraged pt to ask Oncologist when his appt was scheduled. Pt voiced understanding. PET scan scheduled for 08-07-2019

## 2019-08-07 ENCOUNTER — Other Ambulatory Visit: Payer: Self-pay

## 2019-08-07 ENCOUNTER — Ambulatory Visit (HOSPITAL_COMMUNITY)
Admission: RE | Admit: 2019-08-07 | Discharge: 2019-08-07 | Disposition: A | Discharge status: Home / Self Care | Payer: Medicare Other | Source: Ambulatory Visit | Attending: Urology | Admitting: Urology

## 2019-08-07 DIAGNOSIS — R9721 Rising PSA following treatment for malignant neoplasm of prostate: Secondary | ICD-10-CM | POA: Diagnosis not present

## 2019-08-07 DIAGNOSIS — C61 Malignant neoplasm of prostate: Secondary | ICD-10-CM | POA: Diagnosis not present

## 2019-08-07 MED ORDER — AXUMIN (FLUCICLOVINE F 18) INJECTION
10.8000 | Freq: Once | INTRAVENOUS | Status: AC
  Administered 2019-08-07: 10.8 via INTRAVENOUS

## 2019-08-12 ENCOUNTER — Telehealth: Payer: Self-pay

## 2019-08-12 NOTE — Progress Notes (Signed)
NEUROLOGY CONSULTATION NOTE  [name] MRN: [id number] DOB: 07-14-1945  Referring provider: Caryl Pina, MD Primary care provider: Caryl Pina, MD  Reason for consult:  dizziness  HISTORY OF PRESENT ILLNESS: [name] is a 74 year old right-handed white male with CAD, HTN, HLD, prediabetes, CKD stage III, and OSA and history of AAA status post stent who presents for dizziness.  History supplemented by referring provider's note.   In 2006, he hit the top of his head on a pipe and suffered with benign paroxysmal positional vertigo for a year before it finally resolved.  Beginning in 2019, he started having dizziness, but he states this feeling was more of a lightheadedness rather than spinning sensation.  It occurs upon standing up, either from a chair or getting up in the morning.  He has had syncope as a result.  For around the same period of time, he reports unsteady gait with frequent falls.  Falls may occur upon standing or even while walking.  There is no associated dizziness or leg weakness.  He just falls over.  While walking, he feels unsure of himself.  He was evaluated by ENT with unremarkable findings.  Vestibular rehab was unremarkable.  He does have painful neuropathy in the feet and lumbar spinal stenosis from L2-3 through L4-5 as demonstrated on myelogram from 08/01/2016.     CT HEAD WO on 11/27/2018 (personally reviewed):  Atrophy with proportional mild ventriculomegaly as well as milld to moderate chronic small vessel ischemic changes.  He says he is unable to have an MRI due to abdominal aortic stent.  Labs from 06/16/2019 demonstrated CBC with WBC 6.5, HGB 11.7, HCT 34.4, MCV 90, PLT 234; CMP with Na 139, K 3.9, Cl 103, CO2 23, glucose 81, BUN 24, Cr 2.16, GFR 29, t bili <0.2, ALP 91, AST 15, ALT 12; TSH 3.370; Hgb A1c 5.6. B12 from 10/18/2017 was 266.  PAST MEDICAL HISTORY: Past Medical History:  Diagnosis Date  . Aortic atherosclerosis ([place])    2004  s/p  closure Penetrating atherosclerotic ulcer of infarenal aorta w/ endovascular stent graft  . Arthritis   . CKD (chronic kidney disease), stage III   . Closed fracture of head of humerus 07/2017   right shoulder from fall; 09-25-2017 per pt no surgical intervention, only wore sling, intermittant pain  . Coronary atherosclerosis of native coronary artery    positive myoview for ischemia 09-27-2009;  10-01-2009 per cardiac cath-- minimal nonobstructive CAD w/ 30% LAD   . ED (erectile dysfunction)   . Emphysema/COPD [facility])    followed by pcp--- last exacerbation 09-12-2017;  09-25-2017 per pt no cough, sob or congestion  . Essential hypertension   . First degree heart block   . Frequency of urination   . GERD (gastroesophageal reflux disease)   . Gout    09-25-2017  per pt stable,  last episode 2015  . Heart murmur   . History of adenomatous polyp of colon   . History of closed head injury 2005   per pt residual resolved  . History of external beam radiation therapy    completed 10/ 2015 for prostate cancer  . History of gastric ulcer 2014  . History of kidney stones   . History of pneumothorax    spontaneous pneumo treated w/ chest tube  . History of rib fracture 07/2017   from fall,  right side 6th,7th,8th  . Neuropathy   . OAB (overactive bladder)   . OSA (obstructive sleep  apnea)    Intolerant of CPAP  . Prostate cancer ([place])    dx 09-26-2011 via bx-- Stage T3b,N0,  Gleason 4+4, PSA 11.2 with METs to external iliac lymph node(resolved with ADT)-- treated w/ ADT ;   05/2013 staging work-up for rising PSA , started casodex and completed radiation therapy 10/ 2015;   rising PSA post treatment  . RLS (restless legs syndrome)   . Ureteral neocystostomy bleed    left side  . Urge urinary incontinence     PAST SURGICAL HISTORY: Past Surgical History:  Procedure Laterality Date  . ABDOMINAL AORTIC ENDOVASCULAR STENT GRAFT  09-13-2007    dr [name]  [facility]   closure  penetrating atherosclerotic ulcer of infarenal aorta with stent graft  . BIOPSY  03/06/2019   Procedure: BIOPSY;  Surgeon: [name], MD;  [facility];  Service: Endoscopy;;  gastric  . CARDIAC CATHETERIZATION  2005   per pt normal (done in [place])  . CARDIAC CATHETERIZATION  10/06/2009    dr cooper   minimal nonobstructive CAD w/30% LAD otherwise normal coronaries, LVEDP 9mmHg  . CARDIOVASCULAR STRESS TEST  09/27/2009    dr Aundra Dubin   lexiscan nuclear study w/ moderate reversible inferior perfusion defect ischemia,  ef 60% (cardiac cath scheduled)  . CHEST TUBE INSERTION  1989   "collapsed lung"due to injury  . CIRCUMCISION  09/26/2011   Procedure: CIRCUMCISION ADULT;  Surgeon: [name], MD;  Location: [facility];  Service: Urology;  Laterality: N/A;  . COLONOSCOPY W/ POLYPECTOMY    . COLONOSCOPY WITH PROPOFOL N/A 04/27/2016    eight 4-8 mm polyps in descending colon, at splenic flexure, in ascending colon and cecum. Tubular adenomas. Surveillance in 3 years.  . CYSTOSCOPY  09/26/2011   Procedure: CYSTOSCOPY;  Surgeon: [name], MD;  Location: [facility];  Service: Urology;  Laterality: N/A;  . CYSTOSCOPY/RETROGRADE/URETEROSCOPY Left 09/27/2017   Procedure: CYSTOSCOPYLEFT Marlow Baars AND RENAL WASHINGS;  Surgeon: [name], MD;  Location: [facility];  Service: Urology;  Laterality: Left;  . ESOPHAGOGASTRODUODENOSCOPY (EGD) WITH PROPOFOL N/A 04/27/2016   normal esophagus s/p dilation, small hiatal hernia, normal duodenum  . ESOPHAGOGASTRODUODENOSCOPY (EGD) WITH PROPOFOL N/A 03/06/2019   erosive reflux esophagitis, s/p dilation, normal duodenum, abnormal gastric mucosa s/p biopsy. Hyperplastic gastric polyp. No H.pylori.   [name] EXTRACORPOREAL SHOCK WAVE LITHOTRIPSY  yrs ago  . FRACTURE SURGERY  child   Bilateral lower arms   . KNEE ARTHROSCOPY Right 2013  . MALONEY DILATION N/A 04/27/2016   Procedure: Venia Minks DILATION;  Surgeon: [name], MD;   [facility];  Service: Endoscopy;  Laterality: N/A;  . Venia Minks DILATION N/A 03/06/2019   Procedure: Venia Minks DILATION;  Surgeon: [name], MD;  [facility];  Service: Endoscopy;  Laterality: N/A;  . PARTIAL KNEE ARTHROPLASTY Right 04/26/2015   Procedure: RIGHT KNEE MEDIAL UNICOMPARTMENTAL ARTHROPLASTY;  Surgeon: Gaynelle Arabian, MD;  [facility];  Service: Orthopedics;  Laterality: Right;  . POLYPECTOMY  04/27/2016   Procedure: POLYPECTOMY;  Surgeon: [name], MD;  [facility];  Service: Endoscopy;;  cecal , ascending, descending, and sigmoid polypectomies  . PROSTATE BIOPSY  09/26/2011   Procedure: BIOPSY TRANSRECTAL ULTRASONIC PROSTATE (TUBP);  Surgeon: [name], MD;  [facility];  Service: Urology;  Laterality: N/A;     . TRANSTHORACIC ECHOCARDIOGRAM  01-07-2013   dr [name]   ef 60-65%,  grade 1 diastolic dysfunction/  mild AR without stenosis/  mild LAE/  mild to moderate calcificed MV annulus without regurg. or stenosis/  . UMBILICAL HERNIA REPAIR  yrs ago    MEDICATIONS: Current Outpatient Medications on File Prior to Visit  Medication Sig Dispense Refill  . allopurinol (ZYLOPRIM) 300 MG tablet Take 1 tablet (300 mg total) by mouth daily. 90 tablet 3  . amLODipine (NORVASC) 10 MG tablet Take 1 tablet (10 mg total) by mouth daily. TAKE 1 TABLET BY MOUTH ONCE DAILY (NEEDS  TO  BE  SEEN  BEFORE  NEXT  REFILL) 90 tablet 3  . carvedilol (COREG) 25 MG tablet Take 1 tablet (25 mg total) by mouth 2 (two) times daily. 180 tablet 3  . DULoxetine (CYMBALTA) 60 MG capsule TAKE 1 CAPSULE BY MOUTH ONCE DAILY IN THE EVENING 90 capsule 0  . gabapentin (NEURONTIN) 100 MG capsule Take 1 capsule (100 mg total) by mouth 3 (three) times daily. (Patient taking differently: Take 100 mg by mouth at bedtime. ) 90 capsule 3  . losartan (COZAAR) 50 MG tablet Take 1 tablet (50 mg total) by mouth daily. 90 tablet 3  . rosuvastatin (CRESTOR) 20 MG tablet  Take 1 tablet (20 mg total) by mouth daily. 90 tablet 3  . tamsulosin (FLOMAX) 0.4 MG CAPS capsule Take 1 capsule by mouth once daily 90 capsule 0   No current facility-administered medications on file prior to visit.    ALLERGIES: Allergies  Allergen Reactions  . Carbidopa-Levodopa Other (See Comments)    hallunications  . Morphine Sulfate Nausea And Vomiting  . Sulfa Antibiotics Nausea And Vomiting  . Sulfacetamide Sodium Nausea And Vomiting    FAMILY HISTORY: Family History  Problem Relation Age of Onset  . Stroke Mother   . Alzheimer's disease Mother 12       probably due to head injury  . Mesothelioma Father   . Cancer Father        mesothelioma  . Hyperlipidemia Sister   . Heart attack Sister   . Cancer Brother 79  . Heart disease Brother   . Hyperlipidemia Brother   . Cancer Brother        kidney  . Healthy Daughter   . Healthy Son   . Healthy Son   . Colon cancer Neg Hx    SOCIAL HISTORY: Social History   Socioeconomic History  . Marital status: Married    Spouse name: Not on file  . Number of children: 3  . Years of education: 66  . Highest education level: Some college, no degree  Occupational History  . Occupation: Retired    Comment: Hotel manager    Comment: Part Time at [employer]  . Smoking status: Current Every Day Smoker    Packs/day: 0.50    Years: 42.00    Pack years: 21.00    Types: Cigarettes  . Smokeless tobacco: Never Used  Substance and Sexual Activity  . Alcohol use: No    Alcohol/week: 0.0 standard drinks  . Drug use: No  . Sexual activity: Yes    Birth control/protection: None  Other Topics Concern  . Not on file  Social History Narrative   Patient is retired from the [employer] of [employer]. He lives in a one story home with his wife. He moved from [facility] about 9 years ago. They do not have family on the [place] and although he is very outgoing he feels socially isolated. He isn't a member of any  groups or churches.  Social Determinants of Health   Financial Resource Strain:   . Difficulty of Paying Living Expenses:   Food Insecurity:   . Worried About Charity fundraiser in the Last Year:   . Arboriculturist in the Last Year:   Transportation Needs:   . Film/video editor (Medical):   [name] Lack of Transportation (Non-Medical):   Physical Activity:   . Days of Exercise per Week:   . Minutes of Exercise per Session:   Stress:   . Feeling of Stress :   Social Connections:   . Frequency of Communication with Friends and Family:   . Frequency of Social Gatherings with Friends and Family:   . Attends Religious Services:   . Active Member of Clubs or Organizations:   . Attends Archivist Meetings:   [name] Marital Status:   Intimate Partner Violence:   . Fear of Current or Ex-Partner:   . Emotionally Abused:   [name] Physically Abused:   . Sexually Abused:     PHYSICAL EXAM: Blood pressure (!) 153/91, pulse 65, height 5\' 8"  (1.727 m), weight 277 lb 3.2 oz (125.7 kg), SpO2 96 %. General: No acute distress.  Patient appears well-groomed.  Head:  Normocephalic/atraumatic Eyes:  fundi examined but not visualized Neck: supple, no paraspinal tenderness, full range of motion Back: No paraspinal tenderness Heart: regular rate and rhythm Lungs: Clear to auscultation bilaterally. Vascular: No carotid bruits. Neurological Exam: Mental status: alert and oriented to person, place, and time, recent and remote memory intact, fund of knowledge intact, attention and concentration intact, speech fluent and not dysarthric, language intact. Cranial nerves: CN I: not tested CN II: pupils equal, round and reactive to light, visual fields intact CN III, IV, VI:  full range of motion, no nystagmus, no ptosis CN V: facial sensation intact CN VII: upper and lower face symmetric CN VIII: hearing intact CN IX, X: gag intact, uvula midline CN XI: sternocleidomastoid and trapezius muscles  intact CN XII: tongue midline Bulk & Tone: normal, no fasciculations. Motor:  5/5 throughout Sensation:  Pinprick sensation reduced in feet, proprioception reduced in toes, and vibration sensation reduced up to below the knees. Deep Tendon Reflexes:  2+ upper extremities, absent lower extremities; toes downgoing.  Finger to nose testing:  Without dysmetria.  Heel to shin:  Without dysmetria.  Gait:  Ataxic.  Cautiously able to turn; unable to tandem walk. Romberg positive.  IMPRESSION: 1.  Dizziness with history of syncope 2.  Ataxia  I think the dizziness and ataxia are separate entities.  Dizziness is described more like lightheadedness and given history of syncope, may be orthostatic or vasovagal.  It does not seem consistent with BPPV at this time.  He is being followed by a cardiologist.  Patient is ataxic.  He does not exhibit any lateralizing signs on exam.  He does not exhibit upper motor neuron signs to suggest myelopathy such as due to disc bulge or cervical spinal stenosis.  I think his ataxia is related to neuropathy, likely underlying idiopathic polyneuropathy compounded with lumbar spinal stenosis.  He has reported prediabetes but testing has been negative for diabetes.  PLAN: 1.  We will check for common causes of neuropathy:  B12, folate, TSH, SPEP/IFE 2.  We will also order NCV-EMG to evaluate severity of neuropathy. 3.  Advised to use his walker at all times. 4.  Follow up after testing.  Thank you for allowing me to take part in the care of this  patient.  Metta Clines, DO  CC: Caryl Pina, MD

## 2019-08-12 NOTE — Progress Notes (Signed)
The PET shows uptake in the prostate but no lymph node or bone mets.

## 2019-08-12 NOTE — Telephone Encounter (Signed)
-----   Message from [name], MD sent at 08/12/2019  2:27 AM EDT ----- The PET shows uptake in the prostate but no lymph node or bone mets.

## 2019-08-12 NOTE — Telephone Encounter (Signed)
Pt notified of results

## 2019-08-13 ENCOUNTER — Other Ambulatory Visit (INDEPENDENT_AMBULATORY_CARE_PROVIDER_SITE_OTHER): Payer: Medicare Other

## 2019-08-13 ENCOUNTER — Encounter: Payer: Self-pay | Admitting: Neurology

## 2019-08-13 ENCOUNTER — Ambulatory Visit (INDEPENDENT_AMBULATORY_CARE_PROVIDER_SITE_OTHER): Payer: Medicare Other | Admitting: Neurology

## 2019-08-13 ENCOUNTER — Other Ambulatory Visit: Payer: Self-pay

## 2019-08-13 VITALS — BP 153/91 | HR 65 | Ht 68.0 in | Wt 277.2 lb

## 2019-08-13 DIAGNOSIS — R42 Dizziness and giddiness: Secondary | ICD-10-CM | POA: Diagnosis not present

## 2019-08-13 DIAGNOSIS — G609 Hereditary and idiopathic neuropathy, unspecified: Secondary | ICD-10-CM | POA: Diagnosis not present

## 2019-08-13 DIAGNOSIS — R270 Ataxia, unspecified: Secondary | ICD-10-CM

## 2019-08-13 DIAGNOSIS — I25119 Atherosclerotic heart disease of native coronary artery with unspecified angina pectoris: Secondary | ICD-10-CM

## 2019-08-13 LAB — TSH: TSH: 3.59 u[IU]/mL (ref 0.35–4.50)

## 2019-08-13 LAB — B12 AND FOLATE PANEL
Folate: 7.4 ng/mL (ref >5.9)
Vitamin B-12: 234 pg/mL (ref 211–911)

## 2019-08-13 NOTE — Patient Instructions (Addendum)
1.  Check B12, folate, TSH, SPEP/IFE. Your provider has requested that you have labwork completed today. Please go to [facility] ([id number]) on the second floor of this building before leaving the office today. You do not need to check in. If you are not called within 15 minutes please check with the front desk.  2.  Check nerve conduction study of lower extremities 3.  Use walker at all times 4.  Follow up after nerve study

## 2019-08-15 LAB — PROTEIN ELECTROPHORESIS, SERUM
Albumin ELP: 3.8 g/dL (ref 3.8–4.8)
Alpha 1: 0.3 g/dL (ref 0.2–0.3)
Alpha 2: 1 g/dL — ABNORMAL HIGH (ref 0.5–0.9)
Beta 2: 0.4 g/dL (ref 0.2–0.5)
Beta Globulin: 0.4 g/dL (ref 0.4–0.6)
Gamma Globulin: 1 g/dL (ref 0.8–1.7)
Total Protein: 6.9 g/dL (ref 6.1–8.1)

## 2019-08-15 LAB — IMMUNOFIXATION ELECTROPHORESIS
IgG (Immunoglobin G), Serum: 887 mg/dL (ref 600–1540)
IgM, Serum: 318 mg/dL — ABNORMAL HIGH (ref 50–300)
Immunofix Electr Int: NOT DETECTED
Immunoglobulin A: 326 mg/dL — ABNORMAL HIGH (ref 70–320)

## 2019-08-26 IMAGING — US ULTRASOUND ABDOMEN COMPLETE
1 series · 13 of 25 positions shown · non-contrast
Comparison: PET-CT 04/09/2018

CLINICAL DATA: LEFT side abdominal pain

EXAM:
ABDOMEN ULTRASOUND COMPLETE

[Series 1: ultrasound abdomen complete · 0.21mm/px · 13 of 177 slices shown]
[im 1/177]
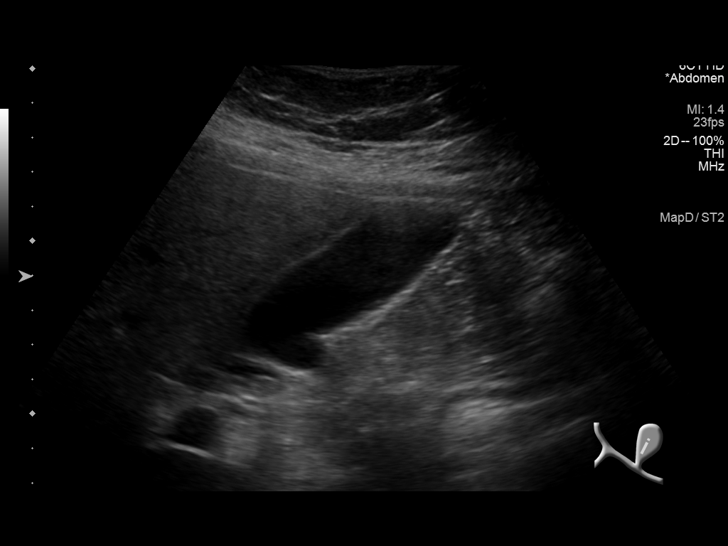
[im 15/177]
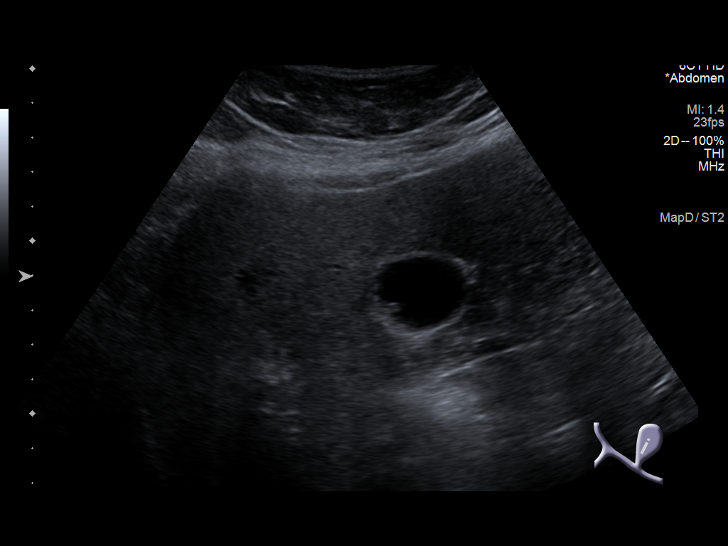
[im 30/177]
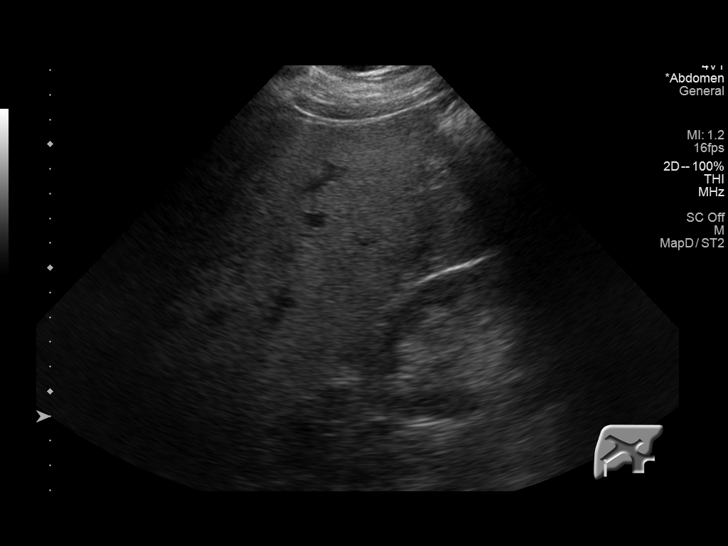
[im 45/177]
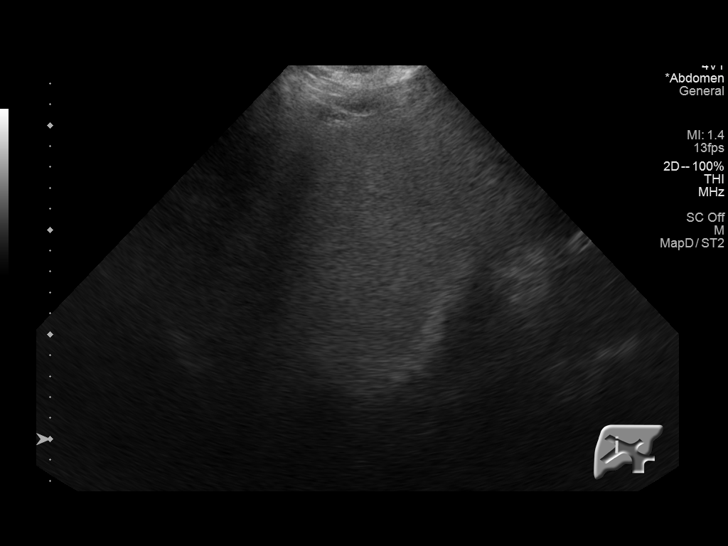
[im 59/177]
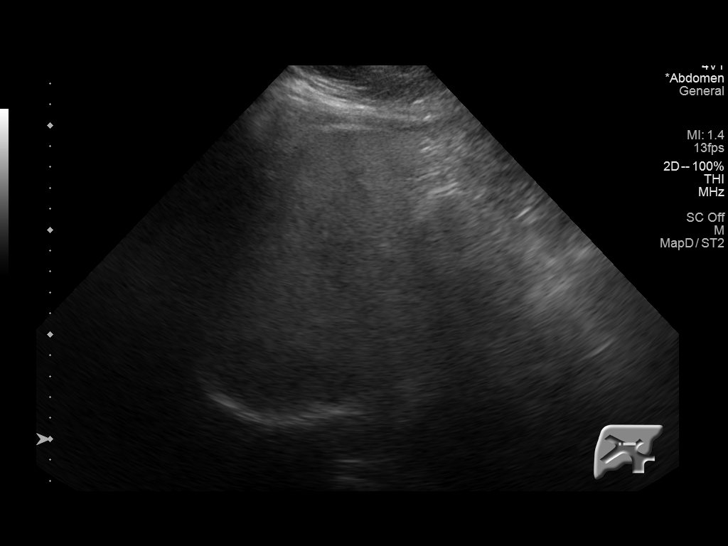
[im 74/177]
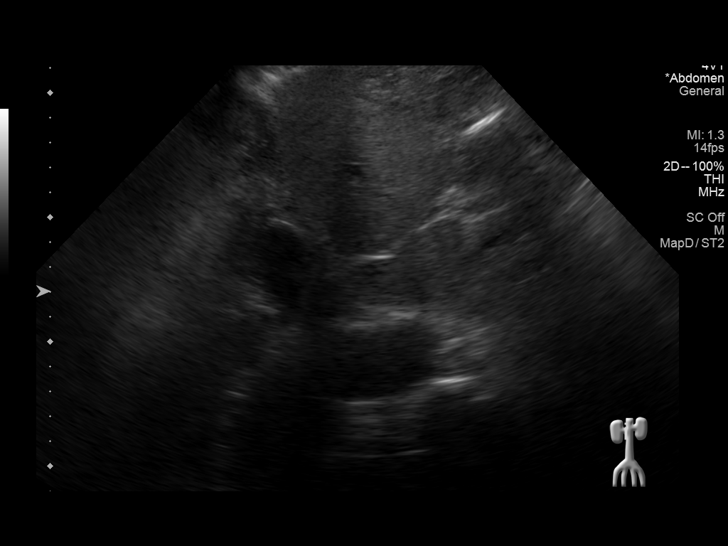
[im 89/177]
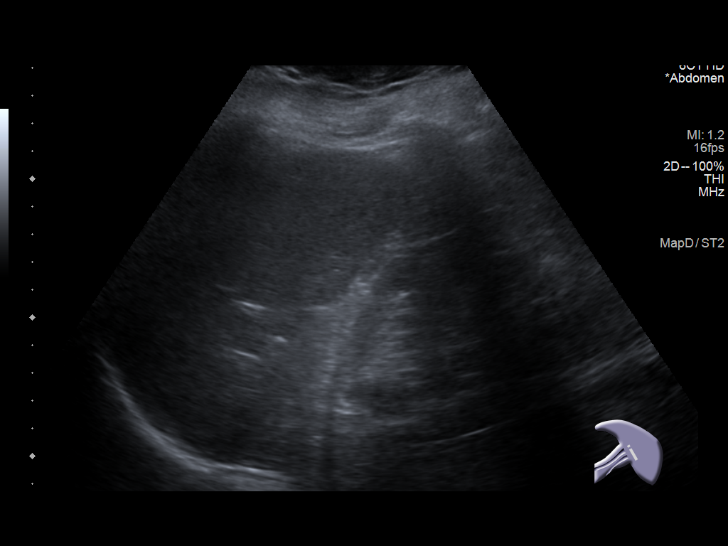
[im 103/177]
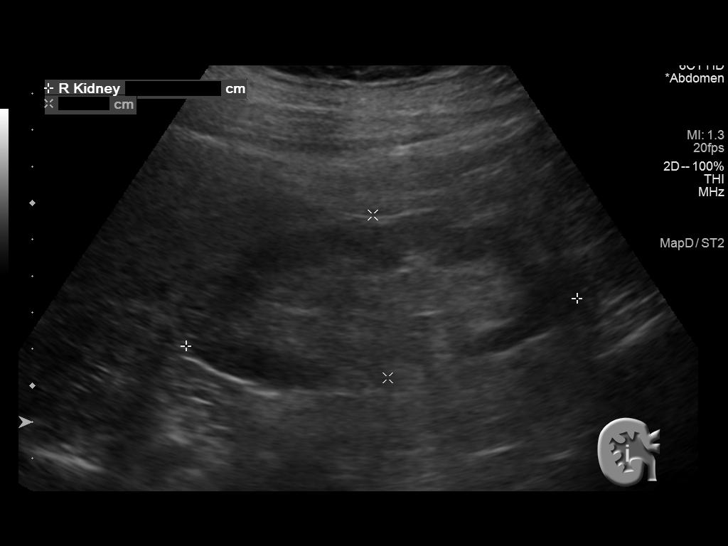
[im 118/177]
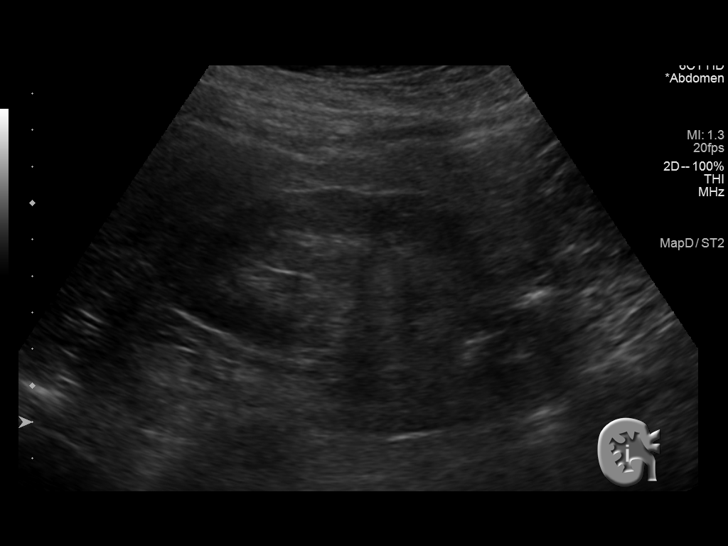
[im 133/177]
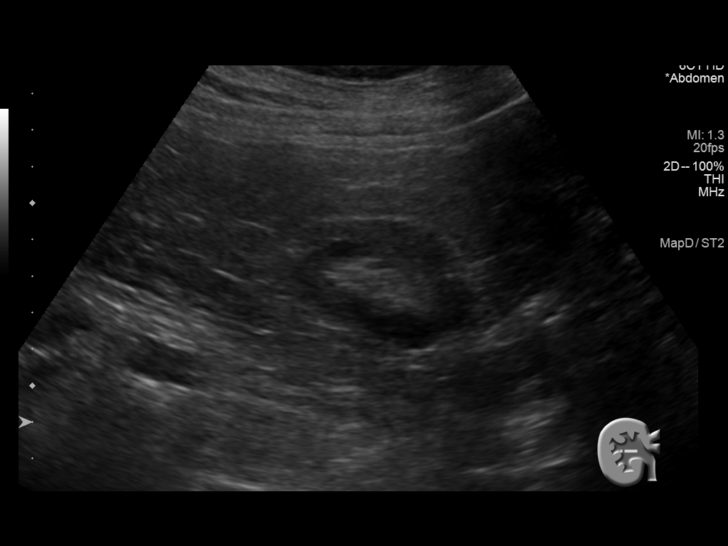
[im 147/177]
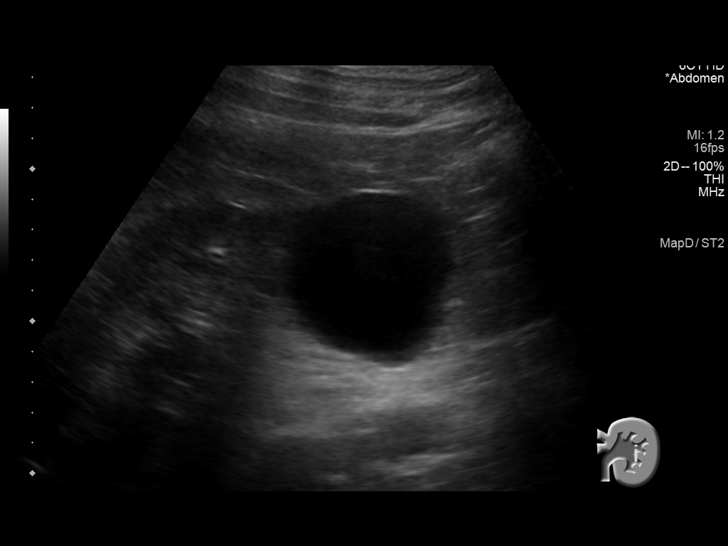
[im 162/177]
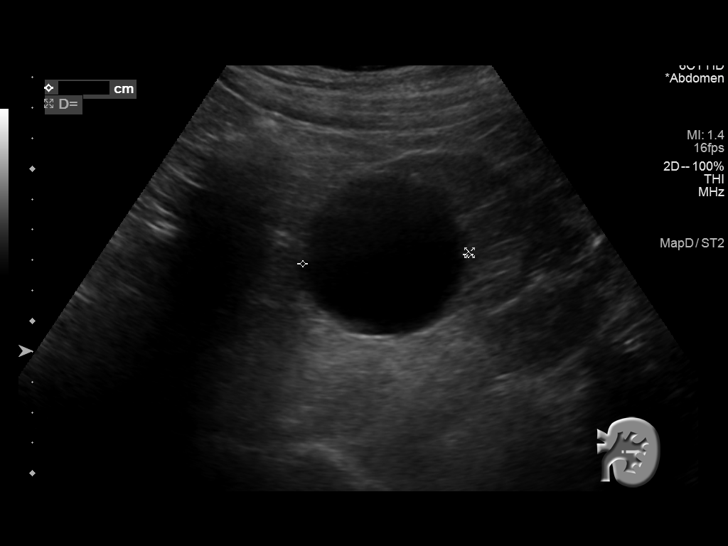
[im 177/177]
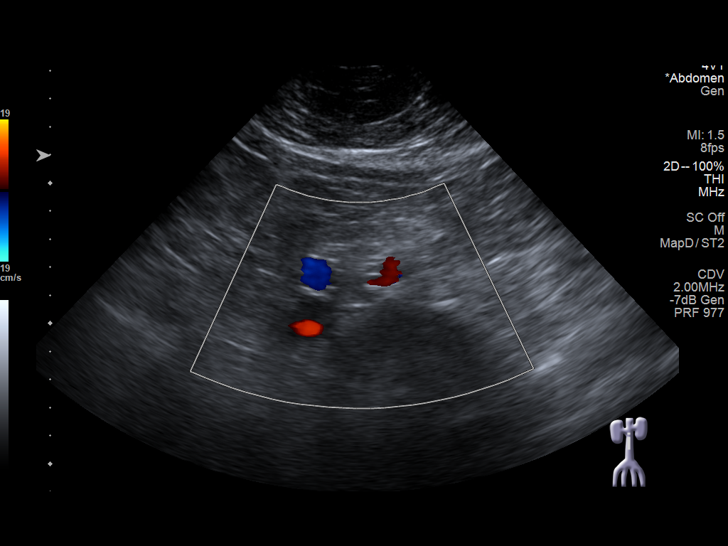

[13 of 25 positions shown; findings below may reference images not displayed]

FINDINGS: Gallbladder: Normally distended without stones or wall thickening.
No pericholecystic fluid or sonographic Murphy sign. 6 mm
gallbladder polyp visualized.

Common bile duct: Diameter: 6 mm, normal for age.

Liver: Liver appears subjectively enlarged with diffusely increased
echogenicity, can be seen with cirrhosis and fatty infiltration. No
focal hepatic mass or nodularity visualized though assessment of
central intrahepatic detail is limited due to sound attenuation.
Portal vein is patent on color Doppler imaging with normal direction
of blood flow towards the liver.

IVC: Suboptimally visualized due to bowel gas and increased hepatic
echogenicity

Pancreas: Predominately obscured by bowel gas; small portion of
pancreatic body normal appearance

Spleen: Normal appearance, 9.9 cm length

Right Kidney: Length: 10.8 cm. Cortical thinning period normal
cortical echogenicity. Small cysts within RIGHT kidney, 1.2 x 1.3 x
1.6 cm at upper pole and 2.6 x 2.2 x 2.5 cm at mid kidney. No
hydronephrosis.

Left Kidney: Length: 12.8 cm. Cortical thinning. Increased cortical
echogenicity. Multiple cysts, largest at inferior pole 5.6 x 5.7 x
5.4 cm, simple character.

Abdominal aorta: Normal caliber

Other findings: No free fluid
IMPRESSION: BILATERAL renal cysts and renal cortical atrophy.

Probable fatty infiltration of liver with suboptimal visualization
of intrahepatic detail; if better intrahepatic visualization is
required recommend either MR or CT imaging.

6 mm gallbladder polyp.

Inadequate visualization of pancreas.

## 2019-09-04 ENCOUNTER — Other Ambulatory Visit: Payer: Self-pay | Admitting: Family Medicine

## 2019-09-04 DIAGNOSIS — G6289 Other specified polyneuropathies: Secondary | ICD-10-CM

## 2019-09-04 DIAGNOSIS — F339 Major depressive disorder, recurrent, unspecified: Secondary | ICD-10-CM

## 2019-09-04 MED ORDER — DULOXETINE HCL 60 MG PO CPEP: ORAL_CAPSULE | ORAL | 0 refills | Status: DC

## 2019-09-04 NOTE — Telephone Encounter (Signed)
Patient has apt with [name] 10/17/19. Refills sent

## 2019-09-04 NOTE — Telephone Encounter (Signed)
  Prescription Request  09/04/2019  What is the name of the medication or equipment? AMLODIPINE AND DULOXETINE  Have you contacted your pharmacy to request a refill? (if applicable) YES  Which pharmacy would you like this sent to? [facility]   Patient notified that their request is being sent to the clinical staff for review and that they should receive a response within 2 business days.

## 2019-10-17 ENCOUNTER — Encounter: Payer: Self-pay | Admitting: Family Medicine

## 2019-10-17 ENCOUNTER — Other Ambulatory Visit: Payer: Self-pay

## 2019-10-17 ENCOUNTER — Ambulatory Visit (INDEPENDENT_AMBULATORY_CARE_PROVIDER_SITE_OTHER): Payer: Medicare Other | Admitting: Family Medicine

## 2019-10-17 ENCOUNTER — Ambulatory Visit: Payer: Medicare Other | Admitting: Urology

## 2019-10-17 VITALS — BP 159/95 | HR 70 | Temp 98.6°F | Ht 68.0 in | Wt 283.0 lb

## 2019-10-17 DIAGNOSIS — I10 Essential (primary) hypertension: Secondary | ICD-10-CM

## 2019-10-17 DIAGNOSIS — Z8546 Personal history of malignant neoplasm of prostate: Secondary | ICD-10-CM | POA: Diagnosis not present

## 2019-10-17 DIAGNOSIS — K219 Gastro-esophageal reflux disease without esophagitis: Secondary | ICD-10-CM | POA: Diagnosis not present

## 2019-10-17 DIAGNOSIS — J449 Chronic obstructive pulmonary disease, unspecified: Secondary | ICD-10-CM

## 2019-10-17 DIAGNOSIS — I25119 Atherosclerotic heart disease of native coronary artery with unspecified angina pectoris: Secondary | ICD-10-CM

## 2019-10-17 DIAGNOSIS — M4726 Other spondylosis with radiculopathy, lumbar region: Secondary | ICD-10-CM

## 2019-10-17 DIAGNOSIS — N1832 Chronic kidney disease, stage 3b: Secondary | ICD-10-CM

## 2019-10-17 MED ORDER — TRAMADOL HCL 50 MG PO TABS: 50.0000 mg | ORAL_TABLET | Freq: Every day | ORAL | 1 refills | Status: DC | PRN

## 2019-10-17 MED ORDER — FLUTICASONE PROPIONATE 50 MCG/ACT NA SUSP
1.0000 | Freq: Two times a day (BID) | NASAL | 6 refills | Status: DC | PRN
Start: 2019-10-17 — End: 2020-01-19

## 2019-10-17 NOTE — Progress Notes (Signed)
BP (!) 159/95   Pulse 70   Temp 98.6 F (37 C)   Ht 5' 8" (1.727 m)   Wt 283 lb (128.4 kg)   SpO2 96%   BMI 43.03 kg/m    Subjective:   Patient ID: [name], male    DOB: [birth year], 74 y.o.   MRN: [id number]  HPI: [name] is a 74 y.o. male presenting on 10/17/2019 for Medical Management of Chronic Issues, Hypertension, and Depression   HPI Pain assessment: Cause of pain-degenerative disc disease in lumbar spine Pain location-lower back but also has weakness and radiation down both legs Pain on scale of 1-10-3 Frequency-Daily What increases pain-movement or being on his feet or traveling, he is about to go on a long travel to visit his family What makes pain Better-he has tried tramadol before and that did help some Effects on ADL -limits prolonged walking or standing Any change in general medical condition-none  Current opioids rx-new start of tramadol 50 mg daily as needed # meds rx-30 Effectiveness of current meds-he says they work well when he uses them, only use them occasionally in the past Adverse reactions form pain meds-none Morphine equivalent-5  Pill count performed-No Last drug screen -N/A ( high risk q83m moderate risk q659mlow risk yearly ) Urine drug screen today- Yes [place]  If yes were their any concerning findings? -None   No flowsheet data found.   Pain contract signed on: Today  Patient has stage III CKD and we will recheck that today.  Patient has history of prostate cancer we will check PSA today.  COPD Patient is coming in for COPD recheck today.  He is currently on no medication currently and feels like he is doing very well, takes some allergy medicine sometimes but has not had problems this year.  He has a mild chronic cough but denies any major coughing spells or wheezing spells.  He has 0nighttime symptoms per week and 0daytime symptoms per week currently.   GERD Patient is currently on no  medication currently.  She denies any major symptoms or abdominal pain or belching or burping. She denies any blood in her stool or lightheadedness or dizziness.   Relevant past medical, surgical, family and social history reviewed and updated as indicated. Interim medical history since our last visit reviewed. Allergies and medications reviewed and updated.  Review of Systems  Constitutional: Negative for chills and fever.  Respiratory: Negative for cough, shortness of breath and wheezing.   Cardiovascular: Negative for chest pain and leg swelling.  Musculoskeletal: Positive for arthralgias and back pain. Negative for gait problem.  Skin: Negative for rash.  Neurological: Positive for weakness (Patient does have some weakness in both of his legs from his back pain.).  All other systems reviewed and are negative.   Per HPI unless specifically indicated above   Allergies as of 10/17/2019      Reactions   Carbidopa-levodopa Other (See Comments)   hallunications   Morphine Sulfate Nausea And Vomiting   Sulfa Antibiotics Nausea And Vomiting   Sulfacetamide Sodium Nausea And Vomiting      Medication List       Accurate as of October 17, 2019 11:11 AM. If you have any questions, ask your nurse or doctor.        allopurinol 300 MG tablet Commonly known as: ZYLOPRIM Take 1 tablet (300 mg total) by mouth daily.   amLODipine 10 MG tablet Commonly known as: NORVASC  Take 1 tablet (10 mg total) by mouth daily. TAKE 1 TABLET BY MOUTH ONCE DAILY (NEEDS  TO  BE  SEEN  BEFORE  NEXT  REFILL)   carvedilol 25 MG tablet Commonly known as: COREG Take 1 tablet (25 mg total) by mouth 2 (two) times daily.   DULoxetine 60 MG capsule Commonly known as: CYMBALTA 1 tablet daily   gabapentin 100 MG capsule Commonly known as: NEURONTIN Take 1 capsule (100 mg total) by mouth 3 (three) times daily. What changed: when to take this   losartan 50 MG tablet Commonly known as: COZAAR Take 1 tablet (50  mg total) by mouth daily.   rosuvastatin 20 MG tablet Commonly known as: CRESTOR Take 1 tablet (20 mg total) by mouth daily.   tamsulosin 0.4 MG Caps capsule Commonly known as: FLOMAX Take 1 capsule by mouth once daily        Objective:   BP (!) 159/95   Pulse 70   Temp 98.6 F (37 C)   Ht 5' 8" (1.727 m)   Wt 283 lb (128.4 kg)   SpO2 96%   BMI 43.03 kg/m   Wt Readings from Last 3 Encounters:  10/17/19 283 lb (128.4 kg)  08/13/19 277 lb 3.2 oz (125.7 kg)  07/18/19 281 lb (127.5 kg)    Physical Exam Vitals and nursing note reviewed.  Constitutional:      General: He is not in acute distress.    Appearance: He is well-developed. He is not diaphoretic.  Eyes:     General: No scleral icterus.    Conjunctiva/sclera: Conjunctivae normal.  Neck:     Thyroid: No thyromegaly.  Cardiovascular:     Rate and Rhythm: Normal rate and regular rhythm.     Heart sounds: Normal heart sounds. No murmur heard.   Pulmonary:     Effort: Pulmonary effort is normal. No respiratory distress.     Breath sounds: Normal breath sounds. No wheezing.  Musculoskeletal:        General: Normal range of motion.     Cervical back: Neck supple.  Lymphadenopathy:     Cervical: No cervical adenopathy.  Skin:    General: Skin is warm and dry.     Findings: No rash.  Neurological:     Mental Status: He is alert and oriented to person, place, and time.     Coordination: Coordination normal.  Psychiatric:        Behavior: Behavior normal.       Assessment & Plan:   Problem List Items Addressed This Visit      Cardiovascular and Mediastinum   Essential hypertension, benign - Primary   Relevant Orders   CBC with Differential/Platelet   CMP14+EGFR   Lipid panel   TSH     Respiratory   COPD (chronic obstructive pulmonary disease) (HCC)   Relevant Medications   fluticasone (FLONASE) 50 MCG/ACT nasal spray   Other Relevant Orders   Lipid panel     Digestive   GERD   Relevant  Orders   CBC with Differential/Platelet   TSH     Genitourinary   CKD (chronic kidney disease), stage III   Relevant Orders   CBC with Differential/Platelet   CMP14+EGFR   TSH    Other Visit Diagnoses    Osteoarthritis of spine with radiculopathy, lumbar region       Relevant Medications   traMADol (ULTRAM) 50 MG tablet   Other Relevant Orders   ToxASSURE Select 13 (MW), Urine  History of prostate cancer       Relevant Orders   PSA, total and free      Degenerative disc disease is still causing a lot of problems, will do tramadol to help until he can get surgery.  He tried injection with Dr. Thurmond Butts most now is looking toward surgery Follow up plan: Return in about 6 months (around 04/18/2020), or if symptoms worsen or fail to improve, for Hypertension COPD.  Counseling provided for all of the vaccine components No orders of the defined types were placed in this encounter.   Caryl Pina, MD [facility] 10/17/2019, 11:11 AM

## 2019-10-21 LAB — TOXASSURE SELECT 13 (MW), URINE

## 2019-10-24 ENCOUNTER — Ambulatory Visit: Payer: Medicare Other | Admitting: Urology

## 2019-10-30 ENCOUNTER — Other Ambulatory Visit: Payer: Self-pay

## 2019-10-30 DIAGNOSIS — C61 Malignant neoplasm of prostate: Secondary | ICD-10-CM

## 2019-11-12 ENCOUNTER — Encounter: Payer: Medicare Other | Admitting: Neurology

## 2019-11-14 ENCOUNTER — Other Ambulatory Visit: Payer: Self-pay

## 2019-11-14 ENCOUNTER — Other Ambulatory Visit: Payer: Self-pay | Admitting: Family Medicine

## 2019-11-14 ENCOUNTER — Other Ambulatory Visit: Payer: Medicare Other

## 2019-11-14 DIAGNOSIS — G6289 Other specified polyneuropathies: Secondary | ICD-10-CM

## 2019-11-14 DIAGNOSIS — F339 Major depressive disorder, recurrent, unspecified: Secondary | ICD-10-CM

## 2019-11-14 DIAGNOSIS — C61 Malignant neoplasm of prostate: Secondary | ICD-10-CM | POA: Diagnosis not present

## 2019-11-14 NOTE — Addendum Note (Signed)
Addended byIris Pert on: 11/14/2019 11:32 AM   Modules accepted: Orders

## 2019-11-15 LAB — PSA: Prostate Specific Ag, Serum: 4.2 ng/mL — ABNORMAL HIGH (ref 0.0–4.0)

## 2019-11-15 LAB — TESTOSTERONE: Testosterone: <3 ng/dL — ABNORMAL LOW (ref 264–916)

## 2019-11-20 NOTE — Progress Notes (Signed)
Subjective:  1. Prostate cancer ([place])      I have prostate cancer. HPI: [name] is a 74 year-old male established patient who is here evaluation for treatment of prostate cancer.  His prostate cancer was diagnosed approximately 12/03/2011.   He has undergone External Beam Radiation Therapy for treatment. He has undergone Hormonal Therapy for treatment and last had Lupron 45mg  IM on 07/18/19.[name]   He does have urinary incontinence. He does have problems with erectile dysfunction. He has not recently had unwanted weight loss. He is not having pain in new locations.   [name] returns in f/u for his locally recurrent prostate cancer now on ADT. His PSA was up to 1.3 on ADT with a testosterone of <10 in 12/20 then 3.2 in 4/21  and it is now 4.2 with a T of <3. .   He remains on Lupron.   He has not seen oncology and is not on second line therapy.  His weight  Is down a couple of pounds.   He has had no bone pain but he has knee pain and has difficulty ambulating.  He continues to have nocturia q1.5hrs.   He has bilateral ankle edema.  He has UUI and wears pads.  He is currently on tamsulosin. He has been on a med in the past that helped but caused constipation.  I believe it was Myrbetriq.    A PET prior to initiation of the firmagon for a rising PSA that increased to 4.0 with a <3 month PSADT showed local uptake on the PET in the prostate with no mets. He had some pulmonary nodules and a CHest was obtained and 3-6 mo f/u imaging was recommended. The CT Chest was done on 08/12/18 and f/u in 6-12 months was recommended. He is doing well without hot flashes or other complaints. He continues to have nocturia q1.5hrs which may be somewhat better since his PCP added tamsulosin. He has some daytime frequency q2-3hrs. He has intermittency and a sensation of incomplete emptying. He has a variable stream. His IPSS is 21.  He has CKD of 2.16 on recent labs.  His UA had 3-10 RBC's on 4/1.   A renal US in  8/20 didn't show any stones.  He has cysts and some atrophy.   He has a history of T3 N1M0 Gleason 8 prostate cancer with metastatic adenopathy in the right iliac chain. He was diagnosed on 09/26/11. He completed radiation therapy in late October 2015. He has been off of the ADT since 3/18 and his PSA has been rising and is up to 4.0 from 1.6 in 9/19 and from 0.81 on 09/24/17 with a normalized testosterone.   He had cystoscopy and ureteroscopy in June 2019 for gross hematuria from the left collecting system. He has no associated signs or symptoms.       ROS:  ROS:  A complete review of systems was performed.  All systems are negative except for pertinent findings as noted.   ROS  Allergies  Allergen Reactions  . Carbidopa-Levodopa Other (See Comments)    hallunications  . Morphine Sulfate Nausea And Vomiting  . Sulfa Antibiotics Nausea And Vomiting  . Sulfacetamide Sodium Nausea And Vomiting    Outpatient Encounter Medications as of 11/21/2019  Medication Sig  . allopurinol (ZYLOPRIM) 300 MG tablet Take 1 tablet (300 mg total) by mouth daily.  [name] amLODipine (NORVASC) 10 MG tablet Take 1 tablet (10 mg total) by mouth daily. TAKE 1 TABLET BY MOUTH  ONCE DAILY (NEEDS  TO  BE  SEEN  BEFORE  NEXT  REFILL)  . carvedilol (COREG) 25 MG tablet Take 1 tablet (25 mg total) by mouth 2 (two) times daily.  . DULoxetine (CYMBALTA) 60 MG capsule Take 1 capsule by mouth once daily  . gabapentin (NEURONTIN) 100 MG capsule Take 1 capsule (100 mg total) by mouth 3 (three) times daily. (Patient taking differently: Take 100 mg by mouth at bedtime. )  . ibuprofen (ADVIL) 200 MG tablet ibuprofen  . losartan (COZAAR) 50 MG tablet Take 1 tablet (50 mg total) by mouth daily.  . tamsulosin (FLOMAX) 0.4 MG CAPS capsule Take 1 capsule by mouth once daily  . traMADol (ULTRAM) 50 MG tablet Take 1 tablet (50 mg total) by mouth daily as needed.  . fluticasone (FLONASE) 50 MCG/ACT nasal spray Place 1 spray into both  nostrils 2 (two) times daily as needed for allergies or rhinitis. (Patient not taking: Reported on 11/21/2019)  . rosuvastatin (CRESTOR) 20 MG tablet Take 1 tablet (20 mg total) by mouth daily.   No facility-administered encounter medications on file as of 11/21/2019.    Past Medical History:  Diagnosis Date  . Aortic atherosclerosis ([place])    2004  s/p  closure Penetrating atherosclerotic ulcer of infarenal aorta w/ endovascular stent graft  . Arthritis   . CKD (chronic kidney disease), stage III   . Closed fracture of head of humerus 07/2017   right shoulder from fall; 09-25-2017 per pt no surgical intervention, only wore sling, intermittant pain  . Coronary atherosclerosis of native coronary artery    positive myoview for ischemia 09-27-2009;  10-01-2009 per cardiac cath-- minimal nonobstructive CAD w/ 30% LAD   . ED (erectile dysfunction)   . Emphysema/COPD [facility])    followed by pcp--- last exacerbation 09-12-2017;  09-25-2017 per pt no cough, sob or congestion  . Essential hypertension   . First degree heart block   . Frequency of urination   . GERD (gastroesophageal reflux disease)   . Gout    09-25-2017  per pt stable,  last episode 2015  . Heart murmur   . History of adenomatous polyp of colon   . History of closed head injury 2005   per pt residual resolved  . History of external beam radiation therapy    completed 10/ 2015 for prostate cancer  . History of gastric ulcer 2014  . History of kidney stones   . History of pneumothorax    spontaneous pneumo treated w/ chest tube  . History of rib fracture 07/2017   from fall,  right side 6th,7th,8th  . Neuropathy   . OAB (overactive bladder)   . OSA (obstructive sleep apnea)    Intolerant of CPAP  . Prostate cancer ([place])    dx 09-26-2011 via bx-- Stage T3b,N0,  Gleason 4+4, PSA 11.2 with METs to external iliac lymph node(resolved with ADT)-- treated w/ ADT ;   05/2013 staging work-up for rising PSA , started casodex and  completed radiation therapy 10/ 2015;   rising PSA post treatment  . RLS (restless legs syndrome)   . Ureteral neocystostomy bleed    left side  . Urge urinary incontinence     Past Surgical History:  Procedure Laterality Date  . ABDOMINAL AORTIC ENDOVASCULAR STENT GRAFT  09-13-2007    dr [name]  [facility]   closure penetrating atherosclerotic ulcer of infarenal aorta with stent graft  . BIOPSY  03/06/2019   Procedure: BIOPSY;  Surgeon: Manus Rudd  M, MD;  Location[facility];  Service: Endoscopy;;  gastric  . CARDIAC CATHETERIZATION  2005   per pt normal (done in [place])  . CARDIAC CATHETERIZATION  10/06/2009    dr cooper   minimal nonobstructive CAD w/30% LAD otherwise normal coronaries, LVEDP 33mmHg  . CARDIOVASCULAR STRESS TEST  09/27/2009    dr Aundra Dubin   lexiscan nuclear study w/ moderate reversible inferior perfusion defect ischemia,  ef 60% (cardiac cath scheduled)  . CHEST TUBE INSERTION  1989   "collapsed lung"due to injury  . CIRCUMCISION  09/26/2011   Procedure: CIRCUMCISION ADULT;  Surgeon: [name], MD;  Location: [facility];  Service: Urology;  Laterality: N/A;  . COLONOSCOPY W/ POLYPECTOMY    . COLONOSCOPY WITH PROPOFOL N/A 04/27/2016    eight 4-8 mm polyps in descending colon, at splenic flexure, in ascending colon and cecum. Tubular adenomas. Surveillance in 3 years.  . CYSTOSCOPY  09/26/2011   Procedure: CYSTOSCOPY;  Surgeon: [name], MD;  Location: [facility];  Service: Urology;  Laterality: N/A;  . CYSTOSCOPY/RETROGRADE/URETEROSCOPY Left 09/27/2017   Procedure: CYSTOSCOPYLEFT Marlow Baars AND RENAL WASHINGS;  Surgeon: [name], MD;  Location: [facility];  Service: Urology;  Laterality: Left;  . ESOPHAGOGASTRODUODENOSCOPY (EGD) WITH PROPOFOL N/A 04/27/2016   normal esophagus s/p dilation, small hiatal hernia, normal duodenum  . ESOPHAGOGASTRODUODENOSCOPY (EGD) WITH PROPOFOL N/A 03/06/2019   erosive reflux esophagitis, s/p dilation,  normal duodenum, abnormal gastric mucosa s/p biopsy. Hyperplastic gastric polyp. No H.pylori.   [name] EXTRACORPOREAL SHOCK WAVE LITHOTRIPSY  yrs ago  . FRACTURE SURGERY  child   Bilateral lower arms   . KNEE ARTHROSCOPY Right 2013  . MALONEY DILATION N/A 04/27/2016   Procedure: Venia Minks DILATION;  Surgeon: [name], MD;  [facility];  Service: Endoscopy;  Laterality: N/A;  . Venia Minks DILATION N/A 03/06/2019   Procedure: Venia Minks DILATION;  Surgeon: [name], MD;  [facility];  Service: Endoscopy;  Laterality: N/A;  . PARTIAL KNEE ARTHROPLASTY Right 04/26/2015   Procedure: RIGHT KNEE MEDIAL UNICOMPARTMENTAL ARTHROPLASTY;  Surgeon: Gaynelle Arabian, MD;  Location: [facility];  Service: Orthopedics;  Laterality: Right;  . POLYPECTOMY  04/27/2016   Procedure: POLYPECTOMY;  Surgeon: [name], MD;  [facility];  Service: Endoscopy;;  cecal , ascending, descending, and sigmoid polypectomies  . PROSTATE BIOPSY  09/26/2011   Procedure: BIOPSY TRANSRECTAL ULTRASONIC PROSTATE (TUBP);  Surgeon: [name], MD;  Location: [facility];  Service: Urology;  Laterality: N/A;     . TRANSTHORACIC ECHOCARDIOGRAM  01-07-2013   dr [name]   ef 60-65%,  grade 1 diastolic dysfunction/  mild AR without stenosis/  mild LAE/  mild to moderate calcificed MV annulus without regurg. or stenosis/  . UMBILICAL HERNIA REPAIR  yrs ago    Social History   Socioeconomic History  . Marital status: Married    Spouse name: Not on file  . Number of children: 3  . Years of education: 49  . Highest education level: Some college, no degree  Occupational History  . Occupation: Retired    Comment: Hotel manager    Comment: Part Time at [employer]  . Smoking status: Current Every Day Smoker    Packs/day: 0.50    Years: 42.00    Pack years: 21.00    Types: Cigarettes  . Smokeless tobacco: Never Used  Vaping Use  . Vaping Use: Never used  Substance and Sexual Activity   .  Alcohol use: No    Alcohol/week: 0.0 standard drinks  . Drug use: No  . Sexual activity: Yes    Birth control/protection: None  Other Topics Concern  . Not on file  Social History Narrative   Patient is retired from the [employer]. He lives in a one story home with his wife. He moved from [facility] about 9 years ago. They do not have family on the [place] and although he is very outgoing he feels socially isolated. He isn't a member of any groups or churches.    Social Determinants of Health   Financial Resource Strain:   . Difficulty of Paying Living Expenses: Not on file  Food Insecurity:   . Worried About Charity fundraiser in the Last Year: Not on file  . Ran Out of Food in the Last Year: Not on file  Transportation Needs:   . Lack of Transportation (Medical): Not on file  . Lack of Transportation (Non-Medical): Not on file  Physical Activity:   . Days of Exercise per Week: Not on file  . Minutes of Exercise per Session: Not on file  Stress:   . Feeling of Stress : Not on file  Social Connections:   . Frequency of Communication with Friends and Family: Not on file  . Frequency of Social Gatherings with Friends and Family: Not on file  . Attends Religious Services: Not on file  . Active Member of Clubs or Organizations: Not on file  . Attends Archivist Meetings: Not on file  . Marital Status: Not on file  Intimate Partner Violence:   . Fear of Current or Ex-Partner: Not on file  . Emotionally Abused: Not on file  . Physically Abused: Not on file  . Sexually Abused: Not on file    Family History  Problem Relation Age of Onset  . Stroke Mother   . Alzheimer's disease Mother 52       probably due to head injury  . Mesothelioma Father   . Cancer Father        mesothelioma  . Hyperlipidemia Sister   . Heart attack Sister   . Cancer Brother 53  . Heart disease Brother   . Hyperlipidemia Brother   . Cancer Brother        kidney  .  Healthy Daughter   . Healthy Son   . Healthy Son   . Colon cancer Neg Hx        Objective: Vitals:   2019-12-20 1309  BP: 129/74  Pulse: 73  Temp: 97.9 F (36.6 C)     Physical Exam  Lab Results:  I have reviewed his recent UA and labs with results noted above.     Studies/Results: Abdominal US in 2022-12-20 reviewed.      Assessment & Plan: Prostate cancer with rising PSA on ADT.  PET just shows local uptake and no mets.   He was given lupron in 4/21. I am going to refer him to medical oncology for consideration of second line therapy.  Microhematuria.  This is mild and has previously been evaluated.  Nocturia and Urge incontinence.   He would like to try the Myrbetriq that he was on in the past again but he will have to go with the 25mg .   He will remain on tamsulosin.      No orders of the defined types were placed in this encounter.    Orders Placed This Encounter  Procedures  .  Microscopic Examination  . Urinalysis, Routine w reflex microscopic      No follow-ups on file.   CC: [name], [name], MD      [name] 11/21/2019

## 2019-11-21 ENCOUNTER — Ambulatory Visit (INDEPENDENT_AMBULATORY_CARE_PROVIDER_SITE_OTHER): Payer: Medicare Other | Admitting: Urology

## 2019-11-21 ENCOUNTER — Encounter: Payer: Self-pay | Admitting: Urology

## 2019-11-21 ENCOUNTER — Other Ambulatory Visit: Payer: Self-pay

## 2019-11-21 VITALS — BP 129/74 | HR 73 | Temp 97.9°F | Ht 68.0 in | Wt 280.0 lb

## 2019-11-21 DIAGNOSIS — N3941 Urge incontinence: Secondary | ICD-10-CM | POA: Diagnosis not present

## 2019-11-21 DIAGNOSIS — R3129 Other microscopic hematuria: Secondary | ICD-10-CM

## 2019-11-21 DIAGNOSIS — C61 Malignant neoplasm of prostate: Secondary | ICD-10-CM

## 2019-11-21 DIAGNOSIS — R351 Nocturia: Secondary | ICD-10-CM

## 2019-11-21 DIAGNOSIS — I25119 Atherosclerotic heart disease of native coronary artery with unspecified angina pectoris: Secondary | ICD-10-CM

## 2019-11-21 DIAGNOSIS — R9721 Rising PSA following treatment for malignant neoplasm of prostate: Secondary | ICD-10-CM | POA: Diagnosis not present

## 2019-11-21 LAB — URINALYSIS, ROUTINE W REFLEX MICROSCOPIC
Bilirubin, UA: NEGATIVE
Glucose, UA: NEGATIVE
Ketones, UA: NEGATIVE
Leukocytes,UA: NEGATIVE
Nitrite, UA: NEGATIVE
Specific Gravity, UA: 1.02 (ref 1.005–1.030)
Urobilinogen, Ur: 0.2 mg/dL (ref 0.2–1.0)
pH, UA: 6 (ref 5.0–7.5)

## 2019-11-21 LAB — MICROSCOPIC EXAMINATION
Bacteria, UA: NONE SEEN
Renal Epithel, UA: NONE SEEN /hpf
WBC, UA: NONE SEEN /hpf (ref 0–5)

## 2019-11-21 MED ORDER — MIRABEGRON ER 25 MG PO TB24: 25.0000 mg | ORAL_TABLET | Freq: Every day | ORAL | 0 refills | Status: DC

## 2019-11-21 NOTE — Progress Notes (Signed)
Urological Symptom Review  Patient is experiencing the following symptoms: Frequent urination Get up at night to urinate Leakage of urine Erection problems  Review of Systems  Gastrointestinal (upper)  : Negative for upper GI symptoms  Gastrointestinal (lower) : Negative for lower GI symptoms  Constitutional : Negative for symptoms  Skin: Negative for skin symptoms  Eyes: Negative for eye symptoms  Ear/Nose/Throat : Negative for Ear/Nose/Throat symptoms  Hematologic/Lymphatic: Negative for Hematologic/Lymphatic symptoms  Cardiovascular : Negative for cardiovascular symptoms  Respiratory : Negative for respiratory symptoms  Endocrine: Negative for endocrine symptoms  Musculoskeletal: Negative for musculoskeletal symptoms  Neurological: Negative for neurological symptoms  Psychologic: Depression

## 2019-11-28 ENCOUNTER — Ambulatory Visit (INDEPENDENT_AMBULATORY_CARE_PROVIDER_SITE_OTHER): Payer: Medicare Other | Admitting: *Deleted

## 2019-11-28 ENCOUNTER — Other Ambulatory Visit: Payer: Self-pay

## 2019-11-28 ENCOUNTER — Encounter: Payer: Self-pay | Admitting: Family Medicine

## 2019-11-28 ENCOUNTER — Telehealth: Payer: Self-pay | Admitting: *Deleted

## 2019-11-28 DIAGNOSIS — Z0000 Encounter for general adult medical examination without abnormal findings: Secondary | ICD-10-CM

## 2019-11-28 NOTE — Telephone Encounter (Signed)
Letter up front to pick up 

## 2019-11-28 NOTE — Progress Notes (Signed)
MEDICARE ANNUAL WELLNESS VISIT  11/28/2019   Provider calling from [facility], Patient participating in call from home.   Telephone Visit Disclaimer This Medicare AWV was conducted by telephone due to national recommendations for restrictions regarding the COVID-19 Pandemic (e.g. social distancing).  I verified, using two identifiers, that I am speaking with [name] or their authorized healthcare agent. I discussed the limitations, risks, security, and privacy concerns of performing an evaluation and management service by telephone and the potential availability of an in-person appointment in the future. The patient expressed understanding and agreed to proceed.   Subjective:  [name] is a 74 y.o. male patient of [name], [name], MD who had a Medicare Annual Wellness Visit today via telephone. [name] is retired and lives with his wife [name]. They have 3 grown children. He reports that he is socially active and does interact with friends/family regularly. He is not physically active and enjoys fishing.  Patient Care Team: [name], [name], MD as PCP - General (Family Medicine) Satira Sark, MD as PCP - Cardiology (Cardiology) Satira Sark, MD as Consulting Physician (Cardiology) Ilean China, RN as Registered Nurse  Advanced Directives 11/28/2019 08/13/2019 03/06/2019 03/05/2019 11/27/2018 09/27/2017 09/14/2017  Does Patient Have a Medical Advance Directive? No No No No No No No  Would patient like information on creating a medical advance directive? No - Patient declined - No - Patient declined No - Patient declined Yes (MAU/Ambulatory/Procedural Areas - Information given) No - Patient declined No - Patient declined    Hospital Utilization Over the Past 12 Months: # of hospitalizations or ER visits: 1 # of surgeries: 1  Review of Systems    Patient reports that his overall health is worse compared to last year.  History obtained from the patient and  patient chart.   Patient Reported Readings (BP, Pulse, CBG, Weight, etc) none  Pain Assessment Pain : 0-10 Pain Score: 7  Pain Type: Chronic pain Pain Location: Back Pain Orientation: Right Pain Descriptors / Indicators: Aching Pain Onset: More than a month ago Pain Frequency: Constant Pain Relieving Factors: advil Effect of Pain on Daily Activities: cant bend or stand for long  Pain Relieving Factors: advil  Current Medications & Allergies (verified) Allergies as of 11/28/2019      Reactions   Carbidopa-levodopa Other (See Comments)   hallunications   Morphine Sulfate Nausea And Vomiting   Sulfa Antibiotics Nausea And Vomiting   Sulfacetamide Sodium Nausea And Vomiting      Medication List       Accurate as of November 28, 2019  9:55 AM. If you have any questions, ask your nurse or doctor.        allopurinol 300 MG tablet Commonly known as: ZYLOPRIM Take 1 tablet (300 mg total) by mouth daily.   amLODipine 10 MG tablet Commonly known as: NORVASC Take 1 tablet (10 mg total) by mouth daily. TAKE 1 TABLET BY MOUTH ONCE DAILY (NEEDS  TO  BE  SEEN  BEFORE  NEXT  REFILL)   carvedilol 25 MG tablet Commonly known as: COREG Take 1 tablet (25 mg total) by mouth 2 (two) times daily.   DULoxetine 60 MG capsule Commonly known as: CYMBALTA Take 1 capsule by mouth once daily   fluticasone 50 MCG/ACT nasal spray Commonly known as: FLONASE Place 1 spray into both nostrils 2 (two) times daily as needed for allergies or rhinitis.   gabapentin 100 MG capsule Commonly known as: NEURONTIN  Take 1 capsule (100 mg total) by mouth 3 (three) times daily. What changed: when to take this   ibuprofen 200 MG tablet Commonly known as: ADVIL ibuprofen   losartan 50 MG tablet Commonly known as: COZAAR Take 1 tablet (50 mg total) by mouth daily.   mirabegron ER 25 MG Tb24 tablet Commonly known as: MYRBETRIQ Take 1 tablet (25 mg total) by mouth daily.   rosuvastatin 20 MG  tablet Commonly known as: CRESTOR Take 1 tablet (20 mg total) by mouth daily.   tamsulosin 0.4 MG Caps capsule Commonly known as: FLOMAX Take 1 capsule by mouth once daily   traMADol 50 MG tablet Commonly known as: ULTRAM Take 1 tablet (50 mg total) by mouth daily as needed.       History (reviewed): Past Medical History:  Diagnosis Date  . Aortic atherosclerosis ([place])    2004  s/p  closure Penetrating atherosclerotic ulcer of infarenal aorta w/ endovascular stent graft  . Arthritis   . CKD (chronic kidney disease), stage III   . Closed fracture of head of humerus 07/2017   right shoulder from fall; 09-25-2017 per pt no surgical intervention, only wore sling, intermittant pain  . Coronary atherosclerosis of native coronary artery    positive myoview for ischemia 09-27-2009;  10-01-2009 per cardiac cath-- minimal nonobstructive CAD w/ 30% LAD   . ED (erectile dysfunction)   . Emphysema/COPD [facility])    followed by pcp--- last exacerbation 09-12-2017;  09-25-2017 per pt no cough, sob or congestion  . Essential hypertension   . First degree heart block   . Frequency of urination   . GERD (gastroesophageal reflux disease)   . Gout    09-25-2017  per pt stable,  last episode 2015  . Heart murmur   . History of adenomatous polyp of colon   . History of closed head injury 2005   per pt residual resolved  . History of external beam radiation therapy    completed 10/ 2015 for prostate cancer  . History of gastric ulcer 2014  . History of kidney stones   . History of pneumothorax    spontaneous pneumo treated w/ chest tube  . History of rib fracture 07/2017   from fall,  right side 6th,7th,8th  . Neuropathy   . OAB (overactive bladder)   . OSA (obstructive sleep apnea)    Intolerant of CPAP  . Prostate cancer ([place])    dx 09-26-2011 via bx-- Stage T3b,N0,  Gleason 4+4, PSA 11.2 with METs to external iliac lymph node(resolved with ADT)-- treated w/ ADT ;   05/2013 staging work-up  for rising PSA , started casodex and completed radiation therapy 10/ 2015;   rising PSA post treatment  . RLS (restless legs syndrome)   . Ureteral neocystostomy bleed    left side  . Urge urinary incontinence    Past Surgical History:  Procedure Laterality Date  . ABDOMINAL AORTIC ENDOVASCULAR STENT GRAFT  09-13-2007    dr [name]  [facility]   closure penetrating atherosclerotic ulcer of infarenal aorta with stent graft  . BIOPSY  03/06/2019   Procedure: BIOPSY;  Surgeon: [name], MD;  [facility];  Service: Endoscopy;;  gastric  . CARDIAC CATHETERIZATION  2005   per pt normal (done in [place])  . CARDIAC CATHETERIZATION  10/06/2009    dr cooper   minimal nonobstructive CAD w/30% LAD otherwise normal coronaries, LVEDP 28mmHg  . CARDIOVASCULAR STRESS TEST  09/27/2009    dr Aundra Dubin  lexiscan nuclear study w/ moderate reversible inferior perfusion defect ischemia,  ef 60% (cardiac cath scheduled)  . CHEST TUBE INSERTION  1989   "collapsed lung"due to injury  . CIRCUMCISION  09/26/2011   Procedure: CIRCUMCISION ADULT;  Surgeon: [name], MD;  Location: [facility];  Service: Urology;  Laterality: N/A;  . COLONOSCOPY W/ POLYPECTOMY    . COLONOSCOPY WITH PROPOFOL N/A 04/27/2016    eight 4-8 mm polyps in descending colon, at splenic flexure, in ascending colon and cecum. Tubular adenomas. Surveillance in 3 years.  . CYSTOSCOPY  09/26/2011   Procedure: CYSTOSCOPY;  Surgeon: [name], MD;  Location: [facility];  Service: Urology;  Laterality: N/A;  . CYSTOSCOPY/RETROGRADE/URETEROSCOPY Left 09/27/2017   Procedure: CYSTOSCOPYLEFT Marlow Baars AND RENAL WASHINGS;  Surgeon: [name], MD;  Location: [facility];  Service: Urology;  Laterality: Left;  . ESOPHAGOGASTRODUODENOSCOPY (EGD) WITH PROPOFOL N/A 04/27/2016   normal esophagus s/p dilation, small hiatal hernia, normal duodenum  . ESOPHAGOGASTRODUODENOSCOPY (EGD) WITH PROPOFOL N/A 03/06/2019   erosive  reflux esophagitis, s/p dilation, normal duodenum, abnormal gastric mucosa s/p biopsy. Hyperplastic gastric polyp. No H.pylori.   [name] EXTRACORPOREAL SHOCK WAVE LITHOTRIPSY  yrs ago  . FRACTURE SURGERY  child   Bilateral lower arms   . KNEE ARTHROSCOPY Right 2013  . MALONEY DILATION N/A 04/27/2016   Procedure: Venia Minks DILATION;  Surgeon: [name], MD;  [facility];  Service: Endoscopy;  Laterality: N/A;  . Venia Minks DILATION N/A 03/06/2019   Procedure: Venia Minks DILATION;  Surgeon: [name], MD;  [facility];  Service: Endoscopy;  Laterality: N/A;  . PARTIAL KNEE ARTHROPLASTY Right 04/26/2015   Procedure: RIGHT KNEE MEDIAL UNICOMPARTMENTAL ARTHROPLASTY;  Surgeon: Gaynelle Arabian, MD;  [facility];  Service: Orthopedics;  Laterality: Right;  . POLYPECTOMY  04/27/2016   Procedure: POLYPECTOMY;  Surgeon: [name], MD;  [facility];  Service: Endoscopy;;  cecal , ascending, descending, and sigmoid polypectomies  . PROSTATE BIOPSY  09/26/2011   Procedure: BIOPSY TRANSRECTAL ULTRASONIC PROSTATE (TUBP);  Surgeon: [name], MD;  [facility];  Service: Urology;  Laterality: N/A;     . TRANSTHORACIC ECHOCARDIOGRAM  01-07-2013   dr [name]   ef 60-65%,  grade 1 diastolic dysfunction/  mild AR without stenosis/  mild LAE/  mild to moderate calcificed MV annulus without regurg. or stenosis/  . UMBILICAL HERNIA REPAIR  yrs ago   Family History  Problem Relation Age of Onset  . Stroke Mother   . Alzheimer's disease Mother 85       probably due to head injury  . Mesothelioma Father   . Cancer Father        mesothelioma  . Hyperlipidemia Sister   . Heart attack Sister   . Cancer Brother 52  . Heart disease Brother   . Hyperlipidemia Brother   . Cancer Brother        kidney  . Healthy Daughter   . Healthy Son   . Healthy Son   . Colon cancer Neg Hx    Social History   Socioeconomic History  . Marital status: Married    Spouse name: Not  on file  . Number of children: 3  . Years of education: 1  . Highest education level: Some college, no degree  Occupational History  . Occupation: Retired    Comment: Hotel manager    Comment: Part Time at [employer]  . Smoking  status: Current Every Day Smoker    Packs/day: 0.50    Years: 42.00    Pack years: 21.00    Types: Cigarettes  . Smokeless tobacco: Never Used  Vaping Use  . Vaping Use: Never used  Substance and Sexual Activity  . Alcohol use: No    Alcohol/week: 0.0 standard drinks  . Drug use: No  . Sexual activity: Yes    Birth control/protection: None  Other Topics Concern  . Not on file  Social History Narrative   Patient is retired from the department of [employer]. He lives in a one story home with his wife. He moved from [facility] about 9 years ago. They do not have family on the [place] and although he is very outgoing he feels socially isolated. He isn't a member of any groups or churches.    Social Determinants of Health   Financial Resource Strain:   . Difficulty of Paying Living Expenses: Not on file  Food Insecurity:   . Worried About Charity fundraiser in the Last Year: Not on file  . Ran Out of Food in the Last Year: Not on file  Transportation Needs:   . Lack of Transportation (Medical): Not on file  . Lack of Transportation (Non-Medical): Not on file  Physical Activity:   . Days of Exercise per Week: Not on file  . Minutes of Exercise per Session: Not on file  Stress:   . Feeling of Stress : Not on file  Social Connections:   . Frequency of Communication with Friends and Family: Not on file  . Frequency of Social Gatherings with Friends and Family: Not on file  . Attends Religious Services: Not on file  . Active Member of Clubs or Organizations: Not on file  . Attends Archivist Meetings: Not on file  . Marital Status: Not on file    Activities of Daily Living In your present state of health, do you have any  difficulty performing the following activities: 11/28/2019 03/05/2019  Hearing? N N  Vision? N N  Difficulty concentrating or making decisions? Y N  Comment some memory problems -  Walking or climbing stairs? Y Y  Dressing or bathing? Y N  Comment dressing is difficult -  Doing errands, shopping? N N  Preparing Food and eating ? N -  Using the Toilet? N -  In the past six months, have you accidently leaked urine? Y -  Do you have problems with loss of bowel control? N -  Managing your Medications? N -  Managing your Finances? N -  Housekeeping or managing your Housekeeping? N -  Some recent data might be hidden    Patient Education/ Literacy How often do you need to have someone help you when you read instructions, pamphlets, or other written materials from your doctor or pharmacy?: 1 - Never What is the last grade level you completed in school?: some college  Exercise Current Exercise Habits: The patient does not participate in regular exercise at present, Exercise limited by: orthopedic condition(s) (back pain)  Diet Patient reports consuming 2 meals a day and 1 snack(s) a day Patient reports that his primary diet is: Regular Patient reports that she does have regular access to food.   Depression Screen PHQ 2/9 Scores 11/28/2019 10/17/2019 08/13/2019 07/15/2019 06/16/2019 11/27/2018 06/18/2018  PHQ - 2 Score 6 5 2 6 2  0 0  PHQ- 9 Score 17 15 12 19 10  - -  Fall Risk Fall Risk  11/28/2019 10/17/2019 08/13/2019 07/15/2019 06/16/2019  Falls in the past year? 1 1 1 1  0  Comment - - - - -  Number falls in past yr: 1 1 1 1  -  Comment - - - - -  Injury with Fall? 1 1 1 1  -  Comment - - - - -  Risk Factor Category  - - - - -  Risk for fall due to : History of fall(s);Impaired balance/gait Impaired balance/gait - History of fall(s);Impaired balance/gait;Impaired mobility -  Follow up Falls evaluation completed Falls evaluation completed - Falls prevention discussed -  Comment - - - - -       Objective:  [name] seemed alert and oriented and he participated appropriately during our telephone visit.  Blood Pressure Weight BMI  BP Readings from Last 3 Encounters:  11/21/19 129/74  10/17/19 (!) 159/95  08/13/19 (!) 153/91   Wt Readings from Last 3 Encounters:  11/21/19 280 lb (127 kg)  10/17/19 283 lb (128.4 kg)  08/13/19 277 lb 3.2 oz (125.7 kg)   BMI Readings from Last 1 Encounters:  11/21/19 42.57 kg/m    *Unable to obtain current vital signs, weight, and BMI due to telephone visit type  Hearing/Vision  . Shanti did not seem to have difficulty with hearing/understanding during the telephone conversation . Reports that he has had a formal eye exam by an eye care professional within the past year . Reports that he has had a formal hearing evaluation within the past year *Unable to fully assess hearing and vision during telephone visit type  Cognitive Function: 6CIT Screen 11/28/2019 11/27/2018  What Year? 0 points 0 points  What month? 0 points 0 points  What time? 0 points 0 points  Count back from 20 0 points 0 points  Months in reverse 0 points 0 points  Repeat phrase 0 points 0 points  Total Score 0 0   (Normal:0-7, Significant for Dysfunction: >8)  Normal Cognitive Function Screening: Yes   Immunization & Health Maintenance Record Immunization History  Administered Date(s) Administered  . Fluad Quad(high Dose 65+) 02/05/2019  . Influenza Whole 01/31/2007, 01/20/2008  . Influenza, High Dose Seasonal PF 01/20/2017  . Influenza,inj,Quad PF,6+ Mos 02/03/2013, 12/30/2014  . Influenza-Unspecified 01/14/2014, 12/29/2015, 01/08/2017  . Moderna SARS-COVID-2 Vaccination 07/08/2019, 08/05/2019  . Pneumococcal Conjugate-13 12/30/2014    Health Maintenance  Topic Date Due  . PNA vac Low Risk Adult (2 of 2 - PPSV23) 12/30/2015  . INFLUENZA VACCINE  11/02/2019  . TETANUS/TDAP  06/15/2020 (Originally 02/06/1965)  . Hepatitis C Screening  06/15/2020  (Originally 04-19-1945)  . COLONOSCOPY  04/27/2026  . COVID-19 Vaccine  Completed       Assessment  This is a routine wellness examination for [name].  Health Maintenance: Due or Overdue Health Maintenance Due  Topic Date Due  . PNA vac Low Risk Adult (2 of 2 - PPSV23) 12/30/2015  . INFLUENZA VACCINE  11/02/2019    [name] does not need a referral for Community Assistance: Care Management:   no Social Work:    no Prescription Assistance:  no Nutrition/Diabetes Education:  no   Plan:  Personalized Goals Goals Addressed            This Visit's Progress   . Patient Stated       11/28/2019 AWV Goal: Keep All Scheduled Appointments  Over the next year, patient will attend all scheduled appointments with their PCP and  any specialists that they see.       Personalized Health Maintenance & Screening Recommendations  Pneumococcal vaccine   Lung Cancer Screening Recommended: yes (Low Dose CT Chest recommended if Age 61-80 years, 30 pack-year currently smoking OR have quit w/in past 15 years) Hepatitis C Screening recommended: no HIV Screening recommended: no  Advanced Directives: Written information was not prepared per patient's request.  Referrals & Orders No orders of the defined types were placed in this encounter.   Follow-up Plan . Follow-up with [name], [name], MD as planned   I have personally reviewed and noted the following in the patient's chart:   . Medical and social history . Use of alcohol, tobacco or illicit drugs  . Current medications and supplements . Functional ability and status . Nutritional status . Physical activity . Advanced directives . List of other physicians . Hospitalizations, surgeries, and ER visits in previous 12 months . Vitals . Screenings to include cognitive, depression, and falls . Referrals and appointments  In addition, I have reviewed and discussed with [name] certain preventive  protocols, quality metrics, and best practice recommendations. A written personalized care plan for preventive services as well as general preventive health recommendations is available and can be mailed to the patient at his request.      Baldomero Lamy, LPN  [id number]

## 2019-11-28 NOTE — Telephone Encounter (Signed)
Patient is requesting letter for jury duty exemption due to frequent urination.

## 2019-11-28 NOTE — Telephone Encounter (Signed)
Sure that is fine to give him a letter for urinary frequency and incontinence.

## 2019-12-02 ENCOUNTER — Encounter (HOSPITAL_COMMUNITY): Payer: Self-pay

## 2019-12-02 NOTE — Progress Notes (Signed)
I attempted to call the patient today for an introductory phone call. Call dropped. I will plan to meet with the patient during initial visit with Dr. Delton Coombes.

## 2019-12-03 ENCOUNTER — Inpatient Hospital Stay (HOSPITAL_COMMUNITY): Payer: Medicare Other | Attending: Hematology | Admitting: Hematology

## 2019-12-03 ENCOUNTER — Other Ambulatory Visit: Payer: Self-pay

## 2019-12-03 ENCOUNTER — Encounter (HOSPITAL_COMMUNITY): Payer: Self-pay | Admitting: Hematology

## 2019-12-03 VITALS — BP 145/83 | HR 73 | Temp 98.0°F | Resp 20 | Ht 68.0 in | Wt 287.0 lb

## 2019-12-03 DIAGNOSIS — I10 Essential (primary) hypertension: Secondary | ICD-10-CM | POA: Diagnosis not present

## 2019-12-03 DIAGNOSIS — I2510 Atherosclerotic heart disease of native coronary artery without angina pectoris: Secondary | ICD-10-CM | POA: Diagnosis not present

## 2019-12-03 DIAGNOSIS — R9721 Rising PSA following treatment for malignant neoplasm of prostate: Secondary | ICD-10-CM | POA: Diagnosis not present

## 2019-12-03 DIAGNOSIS — Z79899 Other long term (current) drug therapy: Secondary | ICD-10-CM | POA: Diagnosis not present

## 2019-12-03 DIAGNOSIS — G2581 Restless legs syndrome: Secondary | ICD-10-CM

## 2019-12-03 DIAGNOSIS — K219 Gastro-esophageal reflux disease without esophagitis: Secondary | ICD-10-CM | POA: Diagnosis not present

## 2019-12-03 DIAGNOSIS — Z8349 Family history of other endocrine, nutritional and metabolic diseases: Secondary | ICD-10-CM | POA: Insufficient documentation

## 2019-12-03 DIAGNOSIS — F17210 Nicotine dependence, cigarettes, uncomplicated: Secondary | ICD-10-CM | POA: Insufficient documentation

## 2019-12-03 DIAGNOSIS — N183 Chronic kidney disease, stage 3 unspecified: Secondary | ICD-10-CM | POA: Diagnosis not present

## 2019-12-03 DIAGNOSIS — N3281 Overactive bladder: Secondary | ICD-10-CM | POA: Insufficient documentation

## 2019-12-03 DIAGNOSIS — Z808 Family history of malignant neoplasm of other organs or systems: Secondary | ICD-10-CM

## 2019-12-03 DIAGNOSIS — G4733 Obstructive sleep apnea (adult) (pediatric): Secondary | ICD-10-CM | POA: Diagnosis not present

## 2019-12-03 DIAGNOSIS — C61 Malignant neoplasm of prostate: Secondary | ICD-10-CM | POA: Insufficient documentation

## 2019-12-03 DIAGNOSIS — Z8249 Family history of ischemic heart disease and other diseases of the circulatory system: Secondary | ICD-10-CM | POA: Insufficient documentation

## 2019-12-03 DIAGNOSIS — Z8051 Family history of malignant neoplasm of kidney: Secondary | ICD-10-CM | POA: Insufficient documentation

## 2019-12-03 NOTE — Patient Instructions (Signed)
[place] at [facility] Discharge Instructions  You were seen and examined today by Dr. Delton Coombes. Dr. Delton Coombes is a medical oncologist, meaning he specializes in the medical management of cancer. Dr. Delton Coombes discussed your past medical history, family history of cancer and current functional status. You were referred to the cancer center by your urologist due to your rising PSA.  Because of your family history of cancer, we will draw lab work today and refer you for genetic counseling. This can help drive treatment options.  Dr. Delton Coombes has ordered a repeat PET scan. Our schedulers will call you with that appointment.   You will return to see Dr. Delton Coombes to discuss the PET scan results and treatment options.   Thank you for choosing [place] at [facility] to provide your oncology and hematology care.  To afford each patient quality time with our provider, please arrive at least 15 minutes before your scheduled appointment time.   If you have a lab appointment with the [facility] please come in thru the Main Entrance and check in at the main information desk.  You need to re-schedule your appointment should you arrive 10 or more minutes late.  We strive to give you quality time with our providers, and arriving late affects you and other patients whose appointments are after yours.  Also, if you no show three or more times for appointments you may be dismissed from the clinic at the providers discretion.     Again, thank you for choosing [facility].  Our hope is that these requests will decrease the amount of time that you wait before being seen by our physicians.       _____________________________________________________________  Should you have questions after your visit to [facility], please contact our office at [phone] and follow the prompts.  Our office hours are 8:00 a.m. and 4:30 p.m.  Monday - Friday.  Please note that voicemails left after 4:00 p.m. may not be returned until the following business day.  We are closed weekends and major holidays.  You do have access to a nurse 24-7, just call the main number to the clinic [phone] and do not press any options, hold on the line and a nurse will answer the phone.    For prescription refill requests, have your pharmacy contact our office and allow 72 hours.    Due to Covid, you will need to wear a mask upon entering the hospital. If you do not have a mask, a mask will be given to you at the Main Entrance upon arrival. For doctor visits, patients may have 1 support person age 20 or older with them. For treatment visits, patients can not have anyone with them due to social distancing guidelines and our immunocompromised population.

## 2019-12-03 NOTE — Progress Notes (Signed)
[facility] [address], [place] [address]   CLINIC:  Medical Oncology/Hematology  CONSULT NOTE  Patient Care Team: [name], [name], MD as PCP - General (Family Medicine) Satira Sark, MD as PCP - Cardiology (Cardiology) Satira Sark, MD as Consulting Physician (Cardiology) Ilean China, RN as Registered Nurse Derek Jack, MD as Medical Oncologist (Medical Oncology)  CHIEF COMPLAINTS/PURPOSE OF CONSULTATION:  Evaluation of recurrent prostate cancer  HISTORY OF PRESENTING ILLNESS:  [name] 74 y.o. male is here because of evaluation of recurrent prostate cancer, at the request of Dr. [name]. He was diagnosed approximately 12/03/2011 and underwent external beam radiation therapy, along with with hormonal therapy with Lupron IM, the last dose being on 07/18/2019.  Today he reports that he is feeling okay and denies having any new aches or pains. He has chronic back pain for which he sees a doctor in [place]. He denies having hot flashes now, but reports that he had hot flashes initially when starting Lupron. He reports feeling unsteady after receiving a blow to his head and had several falls in the past few years, so he ambulates with a walker. He has a constant numbness and tingling in his feet and takes gabapentin daily; he was prescribed to take it TID but felt extremely nauseated. He denies having any MI's, CVA's or seizures. He has a metal stent in his abdominal aorta and he has never had an MRI done.  His father and 2 brothers had renal cancers. He served in [employer] for 4 years. 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 He continues smoking 1/2 PPD for 32 years.   MEDICAL HISTORY:  Past Medical History:  Diagnosis Date  . Aortic atherosclerosis ([place])    2004  s/p  closure Penetrating atherosclerotic ulcer of  infarenal aorta w/ endovascular stent graft  . Arthritis   . CKD (chronic kidney disease), stage III   . Closed fracture of head of humerus 07/2017   right shoulder from fall; 09-25-2017 per pt no surgical intervention, only wore sling, intermittant pain  . Coronary atherosclerosis of native coronary artery    positive myoview for ischemia 09-27-2009;  10-01-2009 per cardiac cath-- minimal nonobstructive CAD w/ 30% LAD   . ED (erectile dysfunction)   . Emphysema/COPD [facility])    followed by pcp--- last exacerbation 09-12-2017;  09-25-2017 per pt no cough, sob or congestion  . Essential hypertension   . First degree heart block   . Frequency of urination   . GERD (gastroesophageal reflux disease)   . Gout    09-25-2017  per pt stable,  last episode 2015  . Heart murmur   . History of adenomatous polyp of colon   . History of closed head injury 2005   per pt residual resolved  . History of external beam radiation therapy    completed 10/ 2015 for prostate cancer  . History of gastric ulcer 2014  . History of kidney stones   . History of pneumothorax    spontaneous pneumo treated w/ chest tube  . History of rib fracture 07/2017   from fall,  right side 6th,7th,8th  . Neuropathy   . OAB (overactive bladder)   . OSA (obstructive sleep apnea)    Intolerant of CPAP  . Prostate cancer ([place])    dx 09-26-2011 via bx-- Stage T3b,N0,  Gleason 4+4, PSA 11.2 with METs to external iliac lymph node(resolved with ADT)-- treated w/ ADT ;   05/2013 staging work-up for rising PSA , started casodex and completed radiation therapy 10/ 2015;   rising PSA post treatment  . RLS (restless legs syndrome)   . Ureteral neocystostomy bleed    left side  . Urge urinary incontinence     SURGICAL HISTORY: Past Surgical History:  Procedure Laterality Date  . ABDOMINAL AORTIC ENDOVASCULAR STENT GRAFT  09-13-2007    dr [name]  [facility]   closure penetrating atherosclerotic ulcer of infarenal aorta with stent graft   . BIOPSY  03/06/2019   Procedure: BIOPSY;  Surgeon: [name], MD;  [facility];  Service: Endoscopy;;  gastric  . CARDIAC CATHETERIZATION  2005   per pt normal (done in [place])  . CARDIAC CATHETERIZATION  10/06/2009    dr cooper   minimal nonobstructive CAD w/30% LAD otherwise normal coronaries, LVEDP 33mmHg  . CARDIOVASCULAR STRESS TEST  09/27/2009    dr Aundra Dubin   lexiscan nuclear study w/ moderate reversible inferior perfusion defect ischemia,  ef 60% (cardiac cath scheduled)  . CHEST TUBE INSERTION  1989   "collapsed lung"due to injury  . CIRCUMCISION  09/26/2011   Procedure: CIRCUMCISION ADULT;  Surgeon: [name], MD;  Location: [facility];  Service: Urology;  Laterality: N/A;  . COLONOSCOPY W/ POLYPECTOMY    . COLONOSCOPY WITH PROPOFOL N/A 04/27/2016    eight 4-8 mm polyps in descending colon, at splenic flexure, in ascending colon and cecum. Tubular adenomas. Surveillance in 3 years.  . CYSTOSCOPY  09/26/2011   Procedure: CYSTOSCOPY;  Surgeon: [name], MD;  Location[facility];  Service: Urology;  Laterality: N/A;  . CYSTOSCOPY/RETROGRADE/URETEROSCOPY Left 09/27/2017   Procedure: CYSTOSCOPYLEFT Marlow Baars AND RENAL WASHINGS;  Surgeon: [name], MD;  Location: [facility];  Service: Urology;  Laterality: Left;  . ESOPHAGOGASTRODUODENOSCOPY (EGD) WITH PROPOFOL N/A 04/27/2016   normal esophagus s/p dilation, small hiatal hernia, normal duodenum  . ESOPHAGOGASTRODUODENOSCOPY (EGD) WITH PROPOFOL N/A 03/06/2019   erosive reflux esophagitis, s/p dilation, normal duodenum, abnormal gastric mucosa s/p biopsy. Hyperplastic gastric polyp. No H.pylori.   [name] EXTRACORPOREAL SHOCK WAVE LITHOTRIPSY  yrs ago  . FRACTURE SURGERY  child   Bilateral lower arms   . KNEE ARTHROSCOPY Right 2013  . MALONEY DILATION N/A 04/27/2016   Procedure: Venia Minks DILATION;  Surgeon: [name], MD;  [facility];  Service: Endoscopy;  Laterality: N/A;  .  Venia Minks DILATION N/A 03/06/2019   Procedure: Venia Minks DILATION;  Surgeon: [name], MD;  [facility];  Service: Endoscopy;  Laterality: N/A;  . PARTIAL KNEE ARTHROPLASTY Right 04/26/2015   Procedure: RIGHT KNEE MEDIAL UNICOMPARTMENTAL ARTHROPLASTY;  Surgeon: Gaynelle Arabian, MD;  [facility];  Service: Orthopedics;  Laterality: Right;  . POLYPECTOMY  04/27/2016   Procedure: POLYPECTOMY;  Surgeon: [name], MD;  [facility];  Service: Endoscopy;;  cecal , ascending, descending, and sigmoid polypectomies  . PROSTATE BIOPSY  09/26/2011   Procedure: BIOPSY TRANSRECTAL ULTRASONIC PROSTATE (TUBP);  Surgeon: [name], MD;  [facility];  Service: Urology;  Laterality: N/A;     . TRANSTHORACIC ECHOCARDIOGRAM  01-07-2013   dr [name]   ef 60-65%,  grade 1 diastolic dysfunction/  mild AR without stenosis/  mild LAE/  mild to moderate calcificed MV annulus without regurg. or stenosis/  . UMBILICAL HERNIA REPAIR  yrs ago  SOCIAL HISTORY: Social History   Socioeconomic History  . Marital status: Married    Spouse name: Not on file  . Number of children: 3  . Years of education: 47  . Highest education level: Some college, no degree  Occupational History  . Occupation: Retired    Comment: Hotel manager    Comment: Part Time at [employer]  . Smoking status: Current Every Day Smoker    Packs/day: 0.50    Years: 42.00    Pack years: 21.00    Types: Cigarettes  . Smokeless tobacco: Never Used  Vaping Use  . Vaping Use: Never used  Substance and Sexual Activity  . Alcohol use: No    Alcohol/week: 0.0 standard drinks  . Drug use: No  . Sexual activity: Yes    Birth control/protection: None  Other Topics Concern  . Not on file  Social History Narrative   Patient is retired from the department of defense. He lives in a one story home with his wife. He moved from [place] [facility] about 9 years ago. They do not have family on the [place] and although he is very outgoing he feels socially isolated. He isn't a member of any groups or churches.    Social Determinants of Health   Financial Resource Strain: High Risk  . Difficulty of Paying Living Expenses: Hard  Food Insecurity:   . Worried About Charity fundraiser in the Last Year: Not on file  . Ran Out of Food in the Last Year: Not on file  Transportation Needs:   . Lack of Transportation (Medical): Not on file  . Lack of Transportation (Non-Medical): Not on file  Physical Activity:   . Days of Exercise per Week: Not on file  . Minutes of Exercise per Session: Not on file  Stress:   . Feeling of Stress : Not on file  Social Connections:   . Frequency of Communication with Friends and Family: Not on file  . Frequency of Social Gatherings with Friends and Family: Not on file  . Attends Religious Services: Not on file  . Active Member of Clubs or Organizations: Not on file  . Attends Archivist Meetings: Not on file  . Marital Status: Not on file  Intimate Partner Violence: Not At Risk  . Fear of Current or Ex-Partner: No  . Emotionally Abused: No  . Physically Abused: No  . Sexually Abused: No    FAMILY HISTORY: Family History  Problem Relation Age of Onset  . Stroke Mother   . Alzheimer's disease Mother 81       probably due to head injury  . Mesothelioma Father   . Cancer Father        mesothelioma  . Hyperlipidemia Sister   . Heart attack Sister   . Cancer Brother 37  . Heart disease Brother   . Hyperlipidemia Brother   . Cancer Brother        kidney  . Healthy Daughter   . Healthy Son   . Healthy Son   . Colon cancer Neg Hx     ALLERGIES:  is allergic to carbidopa-levodopa, morphine sulfate, sulfa antibiotics, and sulfacetamide sodium.  MEDICATIONS:  Current Outpatient Medications  Medication Sig Dispense Refill  . allopurinol (ZYLOPRIM) 300 MG tablet Take 1 tablet (300 mg total) by mouth daily. 90 tablet 3  . amLODipine  (NORVASC) 10 MG tablet Take 1 tablet (10 mg total) by mouth daily. TAKE 1  TABLET BY MOUTH ONCE DAILY (NEEDS  TO  BE  SEEN  BEFORE  NEXT  REFILL) 90 tablet 3  . carvedilol (COREG) 25 MG tablet Take 1 tablet (25 mg total) by mouth 2 (two) times daily. 180 tablet 3  . DULoxetine (CYMBALTA) 60 MG capsule Take 1 capsule by mouth once daily 30 capsule 2  . fluticasone (FLONASE) 50 MCG/ACT nasal spray Place 1 spray into both nostrils 2 (two) times daily as needed for allergies or rhinitis. 16 g 6  . gabapentin (NEURONTIN) 100 MG capsule Take 1 capsule (100 mg total) by mouth 3 (three) times daily. (Patient taking differently: Take 100 mg by mouth at bedtime. ) 90 capsule 3  . ibuprofen (ADVIL) 200 MG tablet ibuprofen    . losartan (COZAAR) 50 MG tablet Take 1 tablet (50 mg total) by mouth daily. 90 tablet 3  . mirabegron ER (MYRBETRIQ) 25 MG TB24 tablet Take 1 tablet (25 mg total) by mouth daily. 28 tablet 0  . tamsulosin (FLOMAX) 0.4 MG CAPS capsule Take 1 capsule by mouth once daily 90 capsule 0  . rosuvastatin (CRESTOR) 20 MG tablet Take 1 tablet (20 mg total) by mouth daily. 90 tablet 3  . traMADol (ULTRAM) 50 MG tablet Take 1 tablet (50 mg total) by mouth daily as needed. (Patient not taking: Reported on 12/03/2019) 30 tablet 1   No current facility-administered medications for this visit.    REVIEW OF SYSTEMS:   Review of Systems  Constitutional: Positive for fatigue (depleted). Negative for appetite change.  Endocrine: Negative for hot flashes.  Musculoskeletal: Positive for back pain (6/10 back pain).  Neurological: Positive for numbness (& tingling in feet). Negative for seizures.  All other systems reviewed and are negative.    PHYSICAL EXAMINATION: ECOG PERFORMANCE STATUS: 1 - Symptomatic but completely ambulatory  Vitals:   12/03/19 1603  BP: (!) 145/83  Pulse: 73  Resp: 20  Temp: 98 F (36.7 C)  SpO2: 96%   Filed Weights   12/03/19 1603  Weight: 287 lb (130.2 kg)    Physical Exam Vitals reviewed.  Constitutional:      Appearance: Normal appearance. He is obese.  Cardiovascular:     Rate and Rhythm: Normal rate and regular rhythm.     Pulses: Normal pulses.     Heart sounds: Normal heart sounds.  Pulmonary:     Effort: Pulmonary effort is normal.     Breath sounds: Normal breath sounds.  Abdominal:     Palpations: Abdomen is soft. There is no hepatomegaly, splenomegaly or mass.     Tenderness: There is no abdominal tenderness.     Hernia: No hernia is present.  Musculoskeletal:     Right lower leg: Edema present.     Left lower leg: Edema present.  Neurological:     General: No focal deficit present.     Mental Status: He is alert and oriented to person, place, and time.  Psychiatric:        Mood and Affect: Mood normal.        Behavior: Behavior normal.      LABORATORY DATA:  I have reviewed the data as listed CBC Latest Ref Rng & Units 06/16/2019 04/08/2018 10/18/2017  WBC 3.4 - 10.8 x10E3/uL 6.5 6.4 9.1  Hemoglobin 13.0 - 17.7 g/dL 11.7(L) 13.3 13.6  Hematocrit 37.5 - 51.0 % 34.4(L) 39.2 40.6  Platelets 150 - 450 x10E3/uL 234 262 252   CMP Latest Ref Rng & Units 08/13/2019 06/16/2019 03/05/2019  Glucose 65 - 99 mg/dL - 81 118(H)  BUN 8 - 27 mg/dL - 24 28(H)  Creatinine 0.76 - 1.27 mg/dL - 2.16(H) 2.00(H)  Sodium 134 - 144 mmol/L - 139 139  Potassium 3.5 - 5.2 mmol/L - 3.9 3.5  Chloride 96 - 106 mmol/L - 103 107  CO2 20 - 29 mmol/L - 23 23  Calcium 8.6 - 10.2 mg/dL - 8.7 8.6(L)  Total Protein 6.1 - 8.1 g/dL 6.9 6.5 -  Total Bilirubin 0.0 - 1.2 mg/dL - <0.2 -  Alkaline Phos 39 - 117 IU/L - 91 -  AST 0 - 40 IU/L - 15 -  ALT 0 - 44 IU/L - 12 -   Lab Results  Component Value Date   PSA 3.2 07/18/2019   PSA 0.37 10/01/2013   PSA 0.27 07/09/2013    RADIOGRAPHIC STUDIES: I have personally reviewed the radiological images as listed and agreed with the findings in the report.  ASSESSMENT:  1.  Castration resistant prostate  cancer: -T3 N1 M0 Gleason 4+4 =8 prostate cancer with metastatic adenopathy in the right iliac chain diagnosed on 09/26/2011, PSA 11.2, treated with ADT only and PSA nadired at around 0.12 in February 2014.  Subsequently slowly rising PSA to 0.32 in October 2014 and 0.66 in February 2015.  Staging scans in March 2015 -.  He was treated with salvage radiation therapy with bicalutamide. ADT was continued until March 2018.  Staging work-up at diagnosis was negative for bone mets. -PSA 0.81 (09/24/2017), 1.6 (9/19), 1.3 (03/2019) 3.2 (07/18/2019), 4.2 (11/14/2019), testosterone less than 3. -Fluciclovine PET scan on 08/07/2019 showed asymmetric activity in the right prostate gland representing residual carcinoma with no evidence of metastatic adenopathy and no skeletal lesions. -Last Lupron injection 45 mg on 07/18/2019.  2.  Social/family history: -Half pack per day current active smoker for 63 years. -Father and 2 brothers had kidney cancer.    PLAN:  1.  Castration resistant prostate cancer: -His PSA has gone up to 4.2 on 11/14/2019 while he is on Lupron. -He has castrate serum levels of testosterone.  PSA doubling time is less than 10 months. -We reviewed normal pathology and treatment recommendations for M0 CRPC.  I have recommended fluciclovine CAT scan to evaluate for metastatic disease.  If there is no metastatic disease, preferred agents include apalutamide, darolutamide and enzalutamide. -I have also recommended genetic testing. -We will also consider bone density testing.  We will see him back after the PET scan.   All questions were answered. The patient knows to call the clinic with any problems, questions or concerns.   Derek Jack, MD, 12/03/19 4:39 PM  [place] [phone]   I, Milinda Antis, am acting as a scribe for Dr. Sanda Linger.  I, Derek Jack MD, have reviewed the above documentation for accuracy and completeness, and I agree with  the above.

## 2019-12-05 ENCOUNTER — Encounter: Payer: Self-pay | Admitting: General Practice

## 2019-12-05 NOTE — Progress Notes (Signed)
[facility] CSW Progress Notes  [place] Initial Psychosocial Assessment Clinical Social Work  Clinical Social Work contacted by phone to assess psychosocial, emotional, mental health, and spiritual needs of the patient.   Barriers to care/review of distress screen:  - Transportation:  Do you anticipate any problems getting to appointments?  Do you have someone who can help run errands for you if you need it?  Has two vehicles, considering selling one of them to help w financial strain - Help at home:  What is your living situation (alone, family, other)?  If you are physically unable to care for yourself, who would you call on to help you?  Lives w wife in double wide on several acres.   - Support system:  What does your support system look like?  Who would you call on if you needed some kind of practical help?  What if you needed someone to talk to for emotional support?  Son lives out of state, has offered for patient and wife to live w him.   - Finances:  Are you concerned about finances.  Considering returning to work?  If not, applying for disability?  Unable to work during [employer], had to [employer] retirement income in order to make ends make. Wants to go back to work but feels he is unable to do so due to his age and disease.  "It plays on my mind every day."  Owes money on his truck and his land.  Owes refund on overpayment for unemployment.  Wants to "get out of the truck or the house."  Feels that if he could do that, he would be able to keep his head above water.    What is your understanding of where you are with your cancer? Its cause?  Your treatment plan and what happens next?  Newly diagnosed w prostate cancer.  In treatment planning process  What are your worries for the future as you begin treatment for cancer?  Finances.  Awareness of his mortality  CSW Summary:  Patient and family psychosocial functioning including strengths, limitations, and coping  skills:  74 year old man, newly diagnosed w prostate cancer, in process of work up and treatment planning.  Lives w wife, moved to area several years ago after living in [place].  Was working prior to [employer] in order to [employer] retirement income.  Now unable to work due to pandemic as well as cancer diagnosis. Admits "I worry for both of [place], my wife doesn't worry about anything."  V concerned about his finances.  Hard for him to think about anything other than finances and cancer diagnosis.  Has been working since age 1, difficult to depend on others when he is used to doing for himself.  Identifications of barriers to care:  Financial strain weighing heavily on him, needs options to adjust his budget to available resources  Availability of community resources:  Legal Aid, Duanne Limerick, [place] Social Worker follow up needed: Yes.    Will meet w him at next [facility] visit.  [name], LCSW Clinical Social Worker Phone:  [phone]

## 2019-12-09 ENCOUNTER — Ambulatory Visit: Payer: Medicare Other | Admitting: Gastroenterology

## 2019-12-10 ENCOUNTER — Ambulatory Visit (INDEPENDENT_AMBULATORY_CARE_PROVIDER_SITE_OTHER): Payer: Medicare Other | Admitting: Neurology

## 2019-12-10 ENCOUNTER — Other Ambulatory Visit: Payer: Self-pay

## 2019-12-10 DIAGNOSIS — R42 Dizziness and giddiness: Secondary | ICD-10-CM

## 2019-12-10 DIAGNOSIS — G609 Hereditary and idiopathic neuropathy, unspecified: Secondary | ICD-10-CM

## 2019-12-10 DIAGNOSIS — M5417 Radiculopathy, lumbosacral region: Secondary | ICD-10-CM

## 2019-12-10 DIAGNOSIS — R270 Ataxia, unspecified: Secondary | ICD-10-CM | POA: Diagnosis not present

## 2019-12-10 NOTE — Procedures (Addendum)
[facility] Neurology  [place] [address], [place]  [place], [place] [address] Tel: [phone] Fax:  [phone] Test Date:  12/10/2019  Patient: [name] DOB: 1945/05/12 Physician: Narda Amber, DO  Sex: Male Height: 5\' 8"  Ref Phys: Metta Clines, D.O.  ID#: [id number] Temp: 32.0C Technician:    Patient Complaints: This is a 74 year old man referred for evaluation of bilateral leg weakness and numbness.  NCV & EMG Findings: Extensive electrodiagnostic testing of the right lower extremity and additional studies of the left was somewhat hampered by body physiognomy.  Findings are as follows:  1. Bilateral sural and superficial peroneal sensory responses are absent. 2. Bilateral peroneal (EDB) and tibial motor responses are absent.  Bilateral peroneal motor responses at the tibialis anterior are within normal limits. 3. Bilateral tibial H reflex studies are absent. 4. Chronic motor axonal loss changes are seen diffusely in the lower extremities bilaterally involving L2-S1 myotomes, without accompanied active denervation.  Impression: 1. The electrophysiologic findings are consistent with a chronic sensorimotor axonal polyneuropathy affecting lower extremities. 2. There is evidence of a superimposed chronic multilevel lumbosacral radiculopathies affecting the L2-S1 nerve roots/segments bilaterally.   ___________________________ Narda Amber, DO    Nerve Conduction Studies Anti Sensory Summary Table   Stim Site NR Peak (ms) Norm Peak (ms) P-T Amp (V) Norm P-T Amp  Left Sup Peroneal Anti Sensory (Ant Lat Mall)  32C  12 cm NR  <4.6  >3  Right Sup Peroneal Anti Sensory (Ant Lat Mall)  32C  12 cm NR  <4.6  >3  Left Sural Anti Sensory (Lat Mall)  32C  Calf NR  <4.6  >3  Right Sural Anti Sensory (Lat Mall)  32C  Calf NR  <4.6  >3   Motor Summary Table   Stim Site NR Onset (ms) Norm Onset (ms) O-P Amp (mV) Norm O-P Amp Site1 Site2 Delta-0 (ms) Dist (cm) Vel (m/s) Norm Vel  (m/s)  Left Peroneal Motor (Ext Dig Brev)  32C  Ankle NR  <6.0  >2.5 B Fib Ankle  0.0  >40  B Fib NR     Poplt B Fib  0.0  >40  Poplt NR            Right Peroneal Motor (Ext Dig Brev)  32C  Ankle NR  <6.0  >2.5 B Fib Ankle  0.0  >40  B Fib NR     Poplt B Fib  0.0  >40  Poplt NR            Left Peroneal TA Motor (Tib Ant)  32C  Fib Head    3.2 <4.5 3.4 >3 Poplit Fib Head 2.1 10.0 48 >40  Poplit    5.3  3.3         Right Peroneal TA Motor (Tib Ant)  32C    body habitus behind knee  Fib Head    2.8 <4.5 3.5 >3 Poplit Fib Head 1.9 10.0 53 >40  Poplit    4.7  1.9         Left Tibial Motor (Abd Hall Brev)  32C  Ankle NR  <6.0  >4 Knee Ankle  0.0  >40  Knee NR            Right Tibial Motor (Abd Hall Brev)  32C  Ankle NR  <6.0  >4 Knee Ankle  0.0  >40  Knee NR             H Reflex Studies  NR H-Lat (ms) Lat Norm (ms) L-R H-Lat (ms)  Left Tibial (Gastroc)  32C  NR  <35   Right Tibial (Gastroc)  32C  NR  <35    EMG   Side Muscle Ins Act Fibs Psw Fasc Number Recrt Dur Dur. Amp Amp. Poly Poly. Comment  Right AntTibialis Nml Nml Nml Nml 3- Rapid Most 1+ Most 2+ Most 2+ N/A  Right Gastroc Nml Nml Nml Nml 2- Rapid Many 1+ Many 1+ Many 1+ N/A  Right Flex Dig Long Nml Nml Nml Nml 3- Rapid All 1+ All 1+ All 1+ N/A  Right RectFemoris Nml Nml Nml Nml 2- Rapid Many 1+ Many 1+ Many 1+ N/A  Right BicepsFemS Nml Nml Nml Nml 1- Rapid Many 1+ Many 1+ Many 1+ N/A  Right GluteusMed Nml Nml Nml Nml 2- Rapid Many 1+ Many 1+ Many Nml N/A  Left AntTibialis Nml Nml Nml Nml 3- Rapid Most 1+ Most 2+ Most 2+ N/A  Left Gastroc Nml Nml Nml Nml 2- Rapid Many 1+ Many 1+ Many 1+ N/A  Left RectFemoris Nml Nml Nml Nml 2- Rapid Many 1+ Many 1+ Many 1+ N/A  Left GluteusMed Nml Nml Nml Nml 1- Rapid Many 1+ Many 1+ Many 1+ N/A  Left BicepsFemS Nml Nml Nml Nml 1- Rapid Many 1+ Many 1+ Many 1+ N/A      Waveforms:

## 2019-12-12 ENCOUNTER — Telehealth: Payer: Self-pay

## 2019-12-12 NOTE — Telephone Encounter (Signed)
-----   Message from Pieter Partridge, DO sent at 12/11/2019  4:01 PM EDT ----- Nerve test shows evidence of old nerve damage in the back as well as neuropathy in the feet, which are expected.  While the neuropathy may be a cause of his balance problem, I would like to image his cervical spine to make sure there isn't any disc bulge or arthritis that is pressing the spinal cord.  He cannot have an MRI.  Therefore, I recommend a CT myelogram of the cervical spine.  He had a myelogram of the lumbar spine back in 2018.

## 2019-12-12 NOTE — Telephone Encounter (Signed)
Pt advised of his EMg.  Pt states he has a CT schedule next week with his Oncologist. And CT of his Lumbar spine with [facility].   Advised pt I will ask Dr. Tomi Likens and get back with him on Monday.

## 2019-12-15 ENCOUNTER — Other Ambulatory Visit: Payer: Self-pay | Admitting: Family Medicine

## 2019-12-15 DIAGNOSIS — R39198 Other difficulties with micturition: Secondary | ICD-10-CM

## 2019-12-15 DIAGNOSIS — N401 Enlarged prostate with lower urinary tract symptoms: Secondary | ICD-10-CM

## 2019-12-15 DIAGNOSIS — C61 Malignant neoplasm of prostate: Secondary | ICD-10-CM

## 2019-12-15 NOTE — Telephone Encounter (Signed)
I need to order specifically a CT myelogram, which is different than a normal CT.

## 2019-12-15 NOTE — Telephone Encounter (Signed)
Telephone call to pt, Pt states he is not found of that CT. The last time he had that done he was sick for two days. Advised pt I will let Dr. Tomi Hanson know.

## 2019-12-15 NOTE — Telephone Encounter (Signed)
We can check a routine CT of cervical spine without contrast but that wouldn't be too sensitive to pick up on spinal stenosis and spinal cord impingement.

## 2019-12-16 ENCOUNTER — Other Ambulatory Visit: Payer: Self-pay

## 2019-12-16 DIAGNOSIS — M5417 Radiculopathy, lumbosacral region: Secondary | ICD-10-CM

## 2019-12-16 NOTE — Telephone Encounter (Signed)
Spoke to pt CT myelogram ordered.

## 2019-12-17 ENCOUNTER — Telehealth: Payer: Self-pay | Admitting: General Practice

## 2019-12-17 ENCOUNTER — Encounter: Payer: Self-pay | Admitting: General Practice

## 2019-12-17 NOTE — Telephone Encounter (Signed)
[facility] CSW Progress Notes  Call to patient - Legal Aid has responded and will try to assist him w his issues w unemployment office.  Patient consents verbally to referral to Legal Aid - Legal Aid will reach out directly to patient.  [name], LCSW Clinical Social Worker Phone:  [phone]

## 2019-12-18 ENCOUNTER — Other Ambulatory Visit: Payer: Self-pay

## 2019-12-18 ENCOUNTER — Ambulatory Visit (HOSPITAL_COMMUNITY)
Admission: RE | Admit: 2019-12-18 | Discharge: 2019-12-18 | Disposition: A | Discharge status: Home / Self Care | Payer: Medicare Other | Source: Ambulatory Visit | Attending: Hematology | Admitting: Hematology

## 2019-12-18 DIAGNOSIS — C61 Malignant neoplasm of prostate: Secondary | ICD-10-CM | POA: Insufficient documentation

## 2019-12-18 DIAGNOSIS — Z8546 Personal history of malignant neoplasm of prostate: Secondary | ICD-10-CM | POA: Diagnosis not present

## 2019-12-18 MED ORDER — AXUMIN (FLUCICLOVINE F 18) INJECTION
10.5200 | Freq: Once | INTRAVENOUS | Status: AC
  Administered 2019-12-18: 10.52 via INTRAVENOUS

## 2019-12-19 ENCOUNTER — Telehealth: Payer: Self-pay

## 2019-12-19 NOTE — Telephone Encounter (Signed)
Phone call to patient to verify medication list and allergies for myelogram procedure. Pt instructed to hold Cymbalta and Tramadol for 48hrs prior to myelogram appointment time and 24 hours after appointment. Pt also instructed to have a driver the day of the procedure, the procedure would take around 2 hours, and discharge instructions discussed. Pt verbalized understanding.

## 2019-12-23 ENCOUNTER — Inpatient Hospital Stay (HOSPITAL_COMMUNITY): Payer: Medicare Other | Admitting: General Practice

## 2019-12-23 ENCOUNTER — Encounter (HOSPITAL_COMMUNITY): Payer: Self-pay | Admitting: Hematology

## 2019-12-23 ENCOUNTER — Other Ambulatory Visit: Payer: Self-pay

## 2019-12-23 ENCOUNTER — Inpatient Hospital Stay (HOSPITAL_BASED_OUTPATIENT_CLINIC_OR_DEPARTMENT_OTHER): Payer: Medicare Other | Admitting: Hematology

## 2019-12-23 VITALS — BP 131/65 | HR 68 | Temp 96.9°F | Resp 21 | Wt 285.2 lb

## 2019-12-23 DIAGNOSIS — G2581 Restless legs syndrome: Secondary | ICD-10-CM | POA: Diagnosis not present

## 2019-12-23 DIAGNOSIS — C61 Malignant neoplasm of prostate: Secondary | ICD-10-CM

## 2019-12-23 DIAGNOSIS — G4733 Obstructive sleep apnea (adult) (pediatric): Secondary | ICD-10-CM | POA: Diagnosis not present

## 2019-12-23 DIAGNOSIS — F17210 Nicotine dependence, cigarettes, uncomplicated: Secondary | ICD-10-CM | POA: Diagnosis not present

## 2019-12-23 DIAGNOSIS — R9721 Rising PSA following treatment for malignant neoplasm of prostate: Secondary | ICD-10-CM | POA: Diagnosis not present

## 2019-12-23 DIAGNOSIS — I10 Essential (primary) hypertension: Secondary | ICD-10-CM | POA: Diagnosis not present

## 2019-12-23 MED ORDER — ENZALUTAMIDE 40 MG PO CAPS: 160.0000 mg | ORAL_CAPSULE | Freq: Every day | ORAL | 0 refills | Status: DC

## 2019-12-23 MED ORDER — PREGABALIN 25 MG PO CAPS: 25.0000 mg | ORAL_CAPSULE | Freq: Two times a day (BID) | ORAL | 1 refills | Status: DC

## 2019-12-23 NOTE — Patient Instructions (Signed)
[place] at [facility] Discharge Instructions  You were seen today by Dr. Delton Coombes. He went over your recent results and scans. You will be prescribed apalutamide to take for your prostate cancer; you will start out taking 2 tablets daily, then you will take 4 tablets daily. You will be prescribed Lyrica to take twice daily for your neuropathy. Dr. Delton Coombes will see you back in 3 weeks (2 weeks after you start taking the apalutamide) for labs and follow up.   Thank you for choosing [place] at [facility] to provide your oncology and hematology care.  To afford each patient quality time with our provider, please arrive at least 15 minutes before your scheduled appointment time.   If you have a lab appointment with the [facility] please come in thru the Main Entrance and check in at the main information desk  You need to re-schedule your appointment should you arrive 10 or more minutes late.  We strive to give you quality time with our providers, and arriving late affects you and other patients whose appointments are after yours.  Also, if you no show three or more times for appointments you may be dismissed from the clinic at the providers discretion.     Again, thank you for choosing [facility].  Our hope is that these requests will decrease the amount of time that you wait before being seen by our physicians.       _____________________________________________________________  Should you have questions after your visit to [facility], please contact our office at [phone] between the hours of 8:00 a.m. and 4:30 p.m.  Voicemails left after 4:00 p.m. will not be returned until the following business day.  For prescription refill requests, have your pharmacy contact our office and allow 72 hours.    Cancer Center Support Programs:   > Cancer Support Group  2nd Tuesday of the month 1pm-2pm, Journey Room

## 2019-12-23 NOTE — Progress Notes (Signed)
[facility] CSW Progress Notes  Call to patient to check on progress towards goals.  Unable to reach, unable to leave VM.  Per email from [employer], they were to reach out to patient to see if they can help w his issues w Employment [employer] office.  Will follow up w patient next week.  [name], LCSW Clinical Social Worker Phone:  [phone]

## 2019-12-23 NOTE — Progress Notes (Signed)
[name], [place] [address]   CLINIC:  Medical Oncology/Hematology  PCP:  [name], [name], MD [place] / [place] [address] [phone]   REASON FOR VISIT:  Follow-up for recurrent prostate cancer  PRIOR THERAPY: External beam radiation therapy with bicalutamide  NGS Results: Not done  CURRENT THERAPY: Under work-up  BRIEF ONCOLOGIC HISTORY:  Oncology History   No history exists.    CANCER STAGING: Cancer Staging Prostate cancer [facility]) Staging form: Prostate, AJCC 7th Edition - Clinical: Stage IV (pT3b, N1, M0) - Signed by Wyatt Portela, MD on 07/09/2013   INTERVAL HISTORY:  [name], a 74 y.o. male, returns for routine follow-up of his recurrent prostate cancer. [name] was last seen on 12/03/2019.  Today he is accompanied by his wife. He reports feeling well. He fell on 9/19 at home after losing his balance and fainting. His feet continue swelling. He checks his BP at home. He also complains of having a constant burning numbness in his toes and he tried taking gabapentin 100 mg TID, but it made him dizzy and he cut it down to 1 tablet at bedtime, which is not helping.  He reports that Dr. [name] will be giving him his Lupron injections. Dr. Tomi Likens had requested a CT myelogram but will proceed with a routine CT of the cervical spine.   REVIEW OF SYSTEMS:  Review of Systems  Constitutional: Positive for fatigue (depleted). Negative for appetite change.  Respiratory: Positive for shortness of breath (w/ exertion).   Cardiovascular: Positive for leg swelling (feet swelling).  Genitourinary: Positive for frequency.   Psychiatric/Behavioral: Positive for depression and sleep disturbance. The patient is nervous/anxious.   All other systems reviewed and are negative.   PAST MEDICAL/SURGICAL HISTORY:  Past Medical History:  Diagnosis Date  . Aortic atherosclerosis ([place])    2004  s/p  closure Penetrating  atherosclerotic ulcer of infarenal aorta w/ endovascular stent graft  . Arthritis   . CKD (chronic kidney disease), stage III   . Closed fracture of head of humerus 07/2017   right shoulder from fall; 09-25-2017 per pt no surgical intervention, only wore sling, intermittant pain  . Coronary atherosclerosis of native coronary artery    positive myoview for ischemia 09-27-2009;  10-01-2009 per cardiac cath-- minimal nonobstructive CAD w/ 30% LAD   . ED (erectile dysfunction)   . Emphysema/COPD [facility])    followed by pcp--- last exacerbation 09-12-2017;  09-25-2017 per pt no cough, sob or congestion  . Essential hypertension   . First degree heart block   . Frequency of urination   . GERD (gastroesophageal reflux disease)   . Gout    09-25-2017  per pt stable,  last episode 2015  . Heart murmur   . History of adenomatous polyp of colon   . History of closed head injury 2005   per pt residual resolved  . History of external beam radiation therapy    completed 10/ 2015 for prostate cancer  . History of gastric ulcer 2014  . History of kidney stones   . History of pneumothorax    spontaneous pneumo treated w/ chest tube  . History of rib fracture 07/2017   from fall,  right side 6th,7th,8th  . Neuropathy   . OAB (overactive bladder)   . OSA (obstructive sleep apnea)    Intolerant of CPAP  . Prostate cancer ([place])    dx 09-26-2011 via bx-- Stage T3b,N0,  Gleason 4+4, PSA 11.2 with METs to external iliac lymph node(resolved with ADT)-- treated w/ ADT ;   05/2013 staging work-up for rising PSA , started casodex and completed radiation therapy 10/ 2015;   rising PSA post treatment  . RLS (restless legs syndrome)   . Ureteral neocystostomy bleed    left side  . Urge urinary incontinence    Past Surgical History:  Procedure Laterality Date  . ABDOMINAL AORTIC ENDOVASCULAR STENT GRAFT  09-13-2007    dr [name]  [facility]   closure penetrating atherosclerotic ulcer of infarenal aorta with stent  graft  . BIOPSY  03/06/2019   Procedure: BIOPSY;  Surgeon: [name], MD;  [facility];  Service: Endoscopy;;  gastric  . CARDIAC CATHETERIZATION  2005   per pt normal (done in [place])  . CARDIAC CATHETERIZATION  10/06/2009    dr cooper   minimal nonobstructive CAD w/30% LAD otherwise normal coronaries, LVEDP 95mmHg  . CARDIOVASCULAR STRESS TEST  09/27/2009    dr Aundra Dubin   lexiscan nuclear study w/ moderate reversible inferior perfusion defect ischemia,  ef 60% (cardiac cath scheduled)  . CHEST TUBE INSERTION  1989   "collapsed lung"due to injury  . CIRCUMCISION  09/26/2011   Procedure: CIRCUMCISION ADULT;  Surgeon: [name], MD;  Location: [facility] OR[facility];  Service: Urology;  Laterality: N/A;  . COLONOSCOPY W/ POLYPECTOMY    . COLONOSCOPY WITH PROPOFOL N/A 04/27/2016    eight 4-8 mm polyps in descending colon, at splenic flexure, in ascending colon and cecum. Tubular adenomas. Surveillance in 3 years.  . CYSTOSCOPY  09/26/2011   Procedure: CYSTOSCOPY;  Surgeon: [name], MD;  Location: [facility];  Service: Urology;  Laterality: N/A;  . CYSTOSCOPY/RETROGRADE/URETEROSCOPY Left 09/27/2017   Procedure: CYSTOSCOPYLEFT Marlow Baars AND RENAL WASHINGS;  Surgeon: [name], MD;  Location: [facility];  Service: Urology;  Laterality: Left;  . ESOPHAGOGASTRODUODENOSCOPY (EGD) WITH PROPOFOL N/A 04/27/2016   normal esophagus s/p dilation, small hiatal hernia, normal duodenum  . ESOPHAGOGASTRODUODENOSCOPY (EGD) WITH PROPOFOL N/A 03/06/2019   erosive reflux esophagitis, s/p dilation, normal duodenum, abnormal gastric mucosa s/p biopsy. Hyperplastic gastric polyp. No H.pylori.   [name] EXTRACORPOREAL SHOCK WAVE LITHOTRIPSY  yrs ago  . FRACTURE SURGERY  child   Bilateral lower arms   . KNEE ARTHROSCOPY Right 2013  . MALONEY DILATION N/A 04/27/2016   Procedure: Venia Minks DILATION;  Surgeon: [name], MD;  [facility];  Service: Endoscopy;  Laterality:  N/A;  . Venia Minks DILATION N/A 03/06/2019   Procedure: Venia Minks DILATION;  Surgeon: [name], MD;  [facility];  Service: Endoscopy;  Laterality: N/A;  . PARTIAL KNEE ARTHROPLASTY Right 04/26/2015   Procedure: RIGHT KNEE MEDIAL UNICOMPARTMENTAL ARTHROPLASTY;  Surgeon: Gaynelle Arabian, MD;  Location[facility];  Service: Orthopedics;  Laterality: Right;  . POLYPECTOMY  04/27/2016   Procedure: POLYPECTOMY;  Surgeon: [name], MD;  [facility];  Service: Endoscopy;;  cecal , ascending, descending, and sigmoid polypectomies  . PROSTATE BIOPSY  09/26/2011   Procedure: BIOPSY TRANSRECTAL ULTRASONIC PROSTATE (TUBP);  Surgeon: [name], MD;  [facility];  Service: Urology;  Laterality: N/A;     . TRANSTHORACIC ECHOCARDIOGRAM  01-07-2013   dr [name]   ef 60-65%,  grade 1 diastolic dysfunction/  mild AR without stenosis/  mild LAE/  mild to moderate calcificed MV annulus without regurg. or stenosis/  . UMBILICAL HERNIA REPAIR  yrs ago    SOCIAL  HISTORY:  Social History   Socioeconomic History  . Marital status: Married    Spouse name: Not on file  . Number of children: 3  . Years of education: 33  . Highest education level: Some college, no degree  Occupational History  . Occupation: Retired    Comment: Hotel manager    Comment: Part Time at [employer]  . Smoking status: Current Every Day Smoker    Packs/day: 0.50    Years: 42.00    Pack years: 21.00    Types: Cigarettes  . Smokeless tobacco: Never Used  Vaping Use  . Vaping Use: Never used  Substance and Sexual Activity  . Alcohol use: No    Alcohol/week: 0.0 standard drinks  . Drug use: No  . Sexual activity: Yes    Birth control/protection: None  Other Topics Concern  . Not on file  Social History Narrative   Patient is retired from the department of [employer]. He lives in a one story home with his wife. He moved from [facility] about 9 years ago. They do not have family on the  [place] and although he is very outgoing he feels socially isolated. He isn't a member of any groups or churches.    Social Determinants of Health   Financial Resource Strain: High Risk  . Difficulty of Paying Living Expenses: Hard  Food Insecurity:   . Worried About Charity fundraiser in the Last Year: Not on file  . Ran Out of Food in the Last Year: Not on file  Transportation Needs: No Transportation Needs  . Lack of Transportation (Medical): No  . Lack of Transportation (Non-Medical): No  Physical Activity:   . Days of Exercise per Week: Not on file  . Minutes of Exercise per Session: Not on file  Stress:   . Feeling of Stress : Not on file  Social Connections:   . Frequency of Communication with Friends and Family: Not on file  . Frequency of Social Gatherings with Friends and Family: Not on file  . Attends Religious Services: Not on file  . Active Member of Clubs or Organizations: Not on file  . Attends Archivist Meetings: Not on file  . Marital Status: Not on file  Intimate Partner Violence: Not At Risk  . Fear of Current or Ex-Partner: No  . Emotionally Abused: No  . Physically Abused: No  . Sexually Abused: No    FAMILY HISTORY:  Family History  Problem Relation Age of Onset  . Stroke Mother   . Alzheimer's disease Mother 95       probably due to head injury  . Mesothelioma Father   . Cancer Father        mesothelioma  . Hyperlipidemia Sister   . Heart attack Sister   . Cancer Brother 86  . Heart disease Brother   . Hyperlipidemia Brother   . Cancer Brother        kidney  . Healthy Daughter   . Healthy Son   . Healthy Son   . Colon cancer Neg Hx     CURRENT MEDICATIONS:  Current Outpatient Medications  Medication Sig Dispense Refill  . allopurinol (ZYLOPRIM) 300 MG tablet Take 1 tablet (300 mg total) by mouth daily. 90 tablet 3  . amLODipine (NORVASC) 10 MG tablet Take 1 tablet (10 mg total) by mouth daily. TAKE 1 TABLET BY MOUTH ONCE  DAILY (NEEDS  TO  BE  SEEN  BEFORE  NEXT  REFILL) 90 tablet 3  . carvedilol (COREG) 25 MG tablet Take 1 tablet (25 mg total) by mouth 2 (two) times daily. 180 tablet 3  . DULoxetine (CYMBALTA) 60 MG capsule Take 1 capsule by mouth once daily 30 capsule 2  . fluticasone (FLONASE) 50 MCG/ACT nasal spray Place 1 spray into both nostrils 2 (two) times daily as needed for allergies or rhinitis. 16 g 6  . gabapentin (NEURONTIN) 100 MG capsule Take 1 capsule (100 mg total) by mouth 3 (three) times daily. (Patient taking differently: Take 100 mg by mouth at bedtime. ) 90 capsule 3  . losartan (COZAAR) 50 MG tablet Take 1 tablet (50 mg total) by mouth daily. 90 tablet 3  . mirabegron ER (MYRBETRIQ) 25 MG TB24 tablet Take 1 tablet (25 mg total) by mouth daily. 28 tablet 0  . rosuvastatin (CRESTOR) 20 MG tablet Take 1 tablet (20 mg total) by mouth daily. 90 tablet 3  . tamsulosin (FLOMAX) 0.4 MG CAPS capsule Take 1 capsule by mouth once daily 90 capsule 1  . traMADol (ULTRAM) 50 MG tablet Take 1 tablet (50 mg total) by mouth daily as needed. 30 tablet 1   No current facility-administered medications for this visit.    ALLERGIES:  Allergies  Allergen Reactions  . Carbidopa-Levodopa Other (See Comments)    hallunications  . Morphine Sulfate Nausea And Vomiting  . Sulfa Antibiotics Nausea And Vomiting  . Sulfacetamide Sodium Nausea And Vomiting    PHYSICAL EXAM:  Performance status (ECOG): 1 - Symptomatic but completely ambulatory  Vitals:   12/23/19 1425  BP: 131/65  Pulse: 68  Resp: (!) 21  Temp: (!) 96.9 F (36.1 C)  SpO2: 97%   Wt Readings from Last 3 Encounters:  12/23/19 285 lb 3.2 oz (129.4 kg)  12/03/19 287 lb (130.2 kg)  11/21/19 280 lb (127 kg)   Physical Exam   LABORATORY DATA:  I have reviewed the labs as listed.  CBC Latest Ref Rng & Units 06/16/2019 04/08/2018 10/18/2017  WBC 3.4 - 10.8 x10E3/uL 6.5 6.4 9.1  Hemoglobin 13.0 - 17.7 g/dL 11.7(L) 13.3 13.6  Hematocrit 37.5 -  51.0 % 34.4(L) 39.2 40.6  Platelets 150 - 450 x10E3/uL 234 262 252   CMP Latest Ref Rng & Units 08/13/2019 06/16/2019 03/05/2019  Glucose 65 - 99 mg/dL - 81 118(H)  BUN 8 - 27 mg/dL - 24 28(H)  Creatinine 0.76 - 1.27 mg/dL - 2.16(H) 2.00(H)  Sodium 134 - 144 mmol/L - 139 139  Potassium 3.5 - 5.2 mmol/L - 3.9 3.5  Chloride 96 - 106 mmol/L - 103 107  CO2 20 - 29 mmol/L - 23 23  Calcium 8.6 - 10.2 mg/dL - 8.7 8.6(L)  Total Protein 6.1 - 8.1 g/dL 6.9 6.5 -  Total Bilirubin 0.0 - 1.2 mg/dL - <0.2 -  Alkaline Phos 39 - 117 IU/L - 91 -  AST 0 - 40 IU/L - 15 -  ALT 0 - 44 IU/L - 12 -    DIAGNOSTIC IMAGING:  I have independently reviewed the scans and discussed with the patient. NCV with EMG(electromyography)  Result Date: 12/10/2019 Alda Berthold, DO     12/10/2019 10:43 AM [facility] [place], [address]  [place], [place] [address] Tel: [phone] Fax:  [phone] Test Date:  12/10/2019 Patient: Neven Fina DOB: May 04, 1945 Physician: Narda Amber, DO Sex: Male Height: 5\' 8"  Ref Phys: Metta Clines, D.O. ID#: [id number] Temp: 32.0C Technician:  Patient Complaints:  This is a 74 year old man referred for evaluation of bilateral leg weakness and numbness. NCV & EMG Findings: Extensive electrodiagnostic testing of the right lower extremity and additional studies of the left was somewhat hampered by body physiognomy.  Findings are as follows: 1. Bilateral sural and superficial peroneal sensory responses are absent. 2. Bilateral peroneal (EDB) and tibial motor responses are absent.  Bilateral peroneal motor responses at the tibialis anterior are within normal limits. 3. Bilateral tibial H reflex studies are absent. 4. Chronic motor axonal loss changes are seen diffusely in the lower extremities bilaterally involving L2-S1 myotomes, without accompanied active denervation. Impression: 1. The electrophysiologic findings are consistent with a chronic sensorimotor axonal polyneuropathy affecting  lower extremities. 2. There is evidence of a superimposed chronic multilevel lumbosacral radiculopathies affecting the L2-S1 nerve roots/segments bilaterally. ___________________________ Narda Amber, DO Nerve Conduction Studies Anti Sensory Summary Table  Stim Site NR Peak (ms) Norm Peak (ms) P-T Amp (V) Norm P-T Amp Left Sup Peroneal Anti Sensory (Ant Lat Mall)  32C 12 cm NR  <4.6  >3 Right Sup Peroneal Anti Sensory (Ant Lat Mall)  32C 12 cm NR  <4.6  >3 Left Sural Anti Sensory (Lat Mall)  32C Calf NR  <4.6  >3 Right Sural Anti Sensory (Lat Mall)  32C Calf NR  <4.6  >3 Motor Summary Table  Stim Site NR Onset (ms) Norm Onset (ms) O-P Amp (mV) Norm O-P Amp Site1 Site2 Delta-0 (ms) Dist (cm) Vel (m/s) Norm Vel (m/s) Left Peroneal Motor (Ext Dig Brev)  32C Ankle NR  <6.0  >2.5 B Fib Ankle  0.0  >40 B Fib NR     Poplt B Fib  0.0  >40 Poplt NR           Right Peroneal Motor (Ext Dig Brev)  32C Ankle NR  <6.0  >2.5 B Fib Ankle  0.0  >40 B Fib NR     Poplt B Fib  0.0  >40 Poplt NR           Left Peroneal TA Motor (Tib Ant)  32C Fib Head    3.2 <4.5 3.4 >3 Poplit Fib Head 2.1 10.0 48 >40 Poplit    5.3  3.3        Right Peroneal TA Motor (Tib Ant)  32C    body habitus behind knee Fib Head    2.8 <4.5 3.5 >3 Poplit Fib Head 1.9 10.0 53 >40 Poplit    4.7  1.9        Left Tibial Motor (Abd Hall Brev)  32C Ankle NR  <6.0  >4 Knee Ankle  0.0  >40 Knee NR           Right Tibial Motor (Abd Hall Brev)  32C Ankle NR  <6.0  >4 Knee Ankle  0.0  >40 Knee NR           H Reflex Studies  NR H-Lat (ms) Lat Norm (ms) L-R H-Lat (ms) Left Tibial (Gastroc)  32C NR  <35  Right Tibial (Gastroc)  32C NR  <35  EMG  Side Muscle Ins Act Fibs Psw Fasc Number Recrt Dur Dur. Amp Amp. Poly Poly. Comment Right AntTibialis Nml Nml Nml Nml 3- Rapid Most 1+ Most 2+ Most 2+ N/A Right Gastroc Nml Nml Nml Nml 2- Rapid Many 1+ Many 1+ Many 1+ N/A Right Flex Dig Long Nml Nml Nml Nml 3- Rapid All 1+ All 1+ All 1+ N/A Right RectFemoris Nml Nml Nml Nml  2- Rapid Many 1+ Many 1+ Many 1+ N/A Right BicepsFemS Nml Nml Nml Nml 1- Rapid Many 1+ Many 1+ Many 1+ N/A Right GluteusMed Nml Nml Nml Nml 2- Rapid Many 1+ Many 1+ Many Nml N/A Left AntTibialis Nml Nml Nml Nml 3- Rapid Most 1+ Most 2+ Most 2+ N/A Left Gastroc Nml Nml Nml Nml 2- Rapid Many 1+ Many 1+ Many 1+ N/A Left RectFemoris Nml Nml Nml Nml 2- Rapid Many 1+ Many 1+ Many 1+ N/A Left GluteusMed Nml Nml Nml Nml 1- Rapid Many 1+ Many 1+ Many 1+ N/A Left BicepsFemS Nml Nml Nml Nml 1- Rapid Many 1+ Many 1+ Many 1+ N/A Waveforms:               NM PET (AXUMIN) SKULL BASE TO MID THIGH  Result Date: 12/19/2019 CLINICAL DATA:  Prostate cancer with initial biopsy in 2013. Radiation seeds in 2017. On hormone deprivation therapy. COVID vaccine in left arm x2 months ago. EXAM: NUCLEAR MEDICINE PET SKULL BASE TO THIGH TECHNIQUE: 10.5 mCi F-18 Fluciclovine was injected intravenously. Full-ring PET imaging was performed from the skull base to thigh after the radiotracer. CT data was obtained and used for attenuation correction and anatomic localization. COMPARISON:  08/07/2019 FINDINGS: NECK No radiotracer activity in neck lymph nodes. Incidental CT finding: No cervical adenopathy. Significant cerebral atrophy. Bilateral carotid atherosclerosis. CHEST An AP window node measures 9 mm and a S.U.V. max of 3.8 on 72/4. 7 mm and not hypermetabolic on the prior. Right hilar hypermetabolism without well-defined adenopathy. a S.U.V. max of 4.2 today versus a S.U.V. max of 3.1 on the prior exam. A node within the azygoesophageal recess measures 8 mm and a S.U.V. max of 4.0 on 85/4. Compare 6 mm and a S.U.V. max of 2.6 on the prior. Incidental CT finding: Tortuous thoracic aorta. Upper normal ascending aortic size, including at 3.8 cm. Mild cardiomegaly. Coronary and aortic atherosclerosis. Calcified right hilar and mediastinal nodes. Emphysema. Right upper lobe nodularity along an area of scarring is similar at 5 mm on 21/8. 3 mm  nodule along the right minor fissure is unchanged on 38/8. Calcified granulomas at the right lung base. ABDOMEN/PELVIS Prostate: Right greater than left prostate hypermetabolism is less asymmetric today, progressive on the left. Example at a S.U.V. max of 7.9 today versus a S.U.V. max of 7.1. Lymph nodes:Bilateral low-level hypermetabolism within inguinal nodes. A right inguinal node measures 7 mm and a S.U.V. max of 4.0 on 204/4 versus 7 mm and a S.U.V. max of 3.4 on the prior exam (when remeasured). A left inguinal node measures 7 mm and a S.U.V. max of 3.3 on 204/4 versus similar in size and hypermetabolism on the prior. Liver: No evidence of liver metastasis Incidental CT finding: Aortic stent graft. Bilateral low-density renal lesions, likely cysts. Left paramidline fat containing ventral abdominal wall hernia. SKELETON No abnormal marrow activity. Right femoral head avascular necrosis is similar. Remote bilateral rib fractures. Remote posttraumatic changes about the proximal right humerus. IMPRESSION: 1. Persistent hypermetabolism involving the prostate in the setting of prior radiation therapy. Slightly increased on the left today, highly suspicious for residual disease. 2. Similar left and slight increase in right inguinal nodal low-level hypermetabolism, favored to be reactive but warranting follow-up attention. 3. Increased hypermetabolism within enlarging thoracic nodes. Cannot exclude an atypical distribution of nodal metastasis. This warrants follow-up attention with either PET or chest CT at 6 months. 4. Incidental findings, including: Aortic atherosclerosis (ICD10-I70.0), coronary artery atherosclerosis and emphysema (ICD10-J43.9). Right  femoral head avascular necrosis. Electronically Signed   By: Abigail Miyamoto M.D.   On: 12/19/2019 09:23     ASSESSMENT:  1.  Castration resistant prostate cancer: -T3 N1 M0 Gleason 4+4 =8 prostate cancer with metastatic adenopathy in the right iliac chain diagnosed  on 09/26/2011, PSA 11.2, treated with ADT only and PSA nadired at around 0.12 in February 2014.  Subsequently slowly rising PSA to 0.32 in October 2014 and 0.66 in February 2015.  Staging scans in March 2015 -.  He was treated with salvage radiation therapy with bicalutamide. ADT was continued until March 2018.  Staging work-up at diagnosis was negative for bone mets. -PSA 0.81 (09/24/2017), 1.6 (9/19), 1.3 (03/2019) 3.2 (07/18/2019), 4.2 (11/14/2019), testosterone less than 3. -Fluciclovine PET scan on 08/07/2019 showed asymmetric activity in the right prostate gland representing residual carcinoma with no evidence of metastatic adenopathy and no skeletal lesions. -Last Lupron injection 45 mg on 07/18/2019. -Fluciclovine PET scan on 12/18/2019 shows persistent hypermetabolic involving the prostate, slightly increased on the left.  Similar left and slight increase in right inguinal nodal low-level hypermetabolism, favored to be reactive but warranting follow-up.  Increased hypermetabolic within the enlarging thoracic lymph nodes, cannot exclude atypical distribution of nodal metastasis.  2.  Social/family history: -Half pack per day current active smoker for 50 years. -Father and 2 brothers had kidney cancer.   PLAN:  1.  Metastatic castration resistant prostate cancer: -His PSA has been going up consistently while he is on Lupron. -I discussed scan results.  Increase in the lymph nodes in the chest most likely favors metastatic disease.  Only AP window lymph node was present in scan from January 2020. -I have recommended enzalutamide.  We will start at 80 mg dose daily and increase it to 120 mg if tolerated well. -We talked about the side effects in detail. -I will see him back 2 weeks after starting therapy.  2.  Neuropathy: -He has diabetic neuropathy in the toes.  He is taking gabapentin 100 mg once daily which is not helping.  When he took for 3 times a day, he felt drowsy and lightheaded and could  not tolerate. -I will start him on Lyrica 25 mg twice daily.  He will discontinue gabapentin.   Orders placed this encounter:  No orders of the defined types were placed in this encounter.    Derek Jack, MD [place] [phone]   I, Milinda Antis, am acting as a scribe for Dr. Sanda Linger.  I, Derek Jack MD, have reviewed the above documentation for accuracy and completeness, and I agree with the above.

## 2019-12-24 ENCOUNTER — Encounter (HOSPITAL_COMMUNITY): Payer: Self-pay

## 2019-12-24 ENCOUNTER — Encounter: Payer: Medicare Other | Admitting: Genetic Counselor

## 2019-12-24 ENCOUNTER — Inpatient Hospital Stay: Payer: Medicare Other | Admitting: Genetic Counselor

## 2019-12-24 ENCOUNTER — Telehealth (HOSPITAL_COMMUNITY): Payer: Self-pay | Admitting: Pharmacy Technician

## 2019-12-24 NOTE — Telephone Encounter (Signed)
Oral Oncology Patient Advocate Encounter  Prior Authorization for [name] has been approved.    PA# had to submit over the phone. Effective dates: 11/24/19 through 12/23/20  Patient must use [employer].  Oral Oncology Clinic will continue to follow.   [name] Patient [place] Phone [phone] Fax [phone] 12/24/2019 4:00 PM

## 2019-12-24 NOTE — Telephone Encounter (Signed)
Oral Oncology Patient Advocate Encounter  Received notification from [facility] FEP that prior authorization for [name] is required.  PA submitted on CoverMyMeds Key [id number]  Status is pending  Oral Oncology Clinic will continue to follow.  [place] Patient [place] Phone [phone] Fax [phone] 12/24/2019 11:41 AM

## 2019-12-24 NOTE — Progress Notes (Signed)
Xtandi prescribed by Dr. Delton Coombes. Medication instructions reviewed with patient. Per Dr. Delton Coombes, patient should start by taking 2 tablets daily for the first two weeks followed by an increase to 4 tablets daily. Patient aware that he should have an office visit with Dr. Delton Coombes prior to increasing dose to 4 tablets daily. Patient scheduled for 01/19/2020 and will call if that date does not work.

## 2019-12-26 ENCOUNTER — Telehealth (HOSPITAL_COMMUNITY): Payer: Self-pay | Admitting: Pharmacist

## 2019-12-26 DIAGNOSIS — C61 Malignant neoplasm of prostate: Secondary | ICD-10-CM

## 2019-12-26 MED ORDER — ENZALUTAMIDE 40 MG PO TABS: 160.0000 mg | ORAL_TABLET | Freq: Every day | ORAL | 0 refills | Status: DC

## 2019-12-26 NOTE — Telephone Encounter (Signed)
Oral Chemotherapy Pharmacy Student Encounter  Patient Education I spoke with patient for overview of new oral chemotherapy medication: Xtandi (enzalutamide) for the treatment of metastatic prostate cancer, planned duration until disease progression or unacceptable drug toxicity.   Pt is doing well. Counseled patient on administration, dosing, side effects, monitoring, drug-food interactions, safe handling, storage, and disposal. Patient will take 2 tablets (80 mg total) by mouth daily. May increase to 3 tablets (120 mg total) if well tolerated. Patient is aware of starting at lower dose.  Side effects include but not limited to: fatigue, constipation, diarrhea.  Reviewed with patient importance of keeping a medication schedule and plan for any missed doses.  After discussion with patient, potential financial barrier to medication adherence identified. Patient reported copay of $80 was expensive. Patient advocate was able to obtain TAF grant for [name] and new billing information was provided to [facility]. Called [name] back to instruct him to request a refund through [facility].  Mr. Zhen voiced understanding and appreciation. All questions answered.  Provided patient with [facility] phone number. Patient knows to call the office with questions or concerns. Oral Chemotherapy Navigation Clinic will continue to follow.   Laurey Arrow, PharmD Candidate 2022 [facility] Oral Chemotherapy [facility] [phone]  12/30/2019 10:56 AM

## 2019-12-26 NOTE — Telephone Encounter (Signed)
Oral Oncology Pharmacy Student Encounter  Received new prescription for Xtandi (enzalutamide) for the treatment of metastatic, recurrent prostate cancer in conjunction with Lupron ADT, planned duration until disease progression or unacceptable drug toxicity.  Prescription dose and frequency assessed - noted dose escalation per provider. He plans to start at 80 mg dose daily and increase it to 120 mg if tolerated well.  Current medication list in Epic reviewed, no DDIs with Xtandi identified.  Evaluated chart and patient-reported difficulty paying living expenses may complicate medication adherence, but no otherwise barriers.   Prescription has been e-scribed to the [facility] for benefits analysis - obtained PA for [place]. Will e-scribe to [facility] per insurance requirement.  Oral Oncology Clinic will continue to follow for copayment issues, initial counseling and start date.  Lowella Fairy D Candidate 2022  [facility] Oral Chemotherapy Navigation [facility] [phone]  12/26/2019 12:31 PM

## 2019-12-29 ENCOUNTER — Inpatient Hospital Stay
Admission: RE | Admit: 2019-12-29 | Discharge: 2019-12-29 | Disposition: A | Discharge status: Home / Self Care | Payer: Medicare Other | Source: Ambulatory Visit | Attending: Neurology | Admitting: Neurology

## 2019-12-29 ENCOUNTER — Other Ambulatory Visit: Payer: Medicare Other

## 2019-12-29 NOTE — Discharge Instructions (Signed)
Myelogram Discharge Instructions  1. Go home and rest quietly for the next 24 hours.  It is important to lie flat for the next 24 hours.  Get up only to go to the restroom.  You may lie in the bed or on a couch on your back, your stomach, your left side or your right side.  You may have one pillow under your head.  You may have pillows between your knees while you are on your side or under your knees while you are on your back.  2. DO NOT drive today.  Recline the seat as far back as it will go, while still wearing your seat belt, on the way home.  3. You may get up to go to the bathroom as needed.  You may sit up for 10 minutes to eat.  You may resume your normal diet and medications unless otherwise indicated.  Drink lots of extra fluids today and tomorrow.  4. The incidence of headache, nausea, or vomiting is about 5% (one in 20 patients).  If you develop a headache, lie flat and drink plenty of fluids until the headache goes away.  Caffeinated beverages may be helpful.  If you develop severe nausea and vomiting or a headache that does not go away with flat bed rest, call [phone].  5. You may resume normal activities after your 24 hours of bed rest is over; however, do not exert yourself strongly or do any heavy lifting tomorrow. If when you get up you have a headache when standing, go back to bed and force fluids for another 24 hours.  6. Call your physician for a follow-up appointment.  The results of your myelogram will be sent directly to your physician by the following day.  7. If you have any questions or if complications develop after you arrive home, please call [phone].  Discharge instructions have been explained to the patient.  The patient, or the person responsible for the patient, fully understands these instructions.  YOU MAY RESTART YOUR CYMBALTA AND TRAMADOL TOMORROW 12/30/19 AT 10:30AM.

## 2019-12-30 ENCOUNTER — Telehealth (HOSPITAL_COMMUNITY): Payer: Self-pay | Admitting: General Practice

## 2019-12-30 ENCOUNTER — Other Ambulatory Visit (HOSPITAL_COMMUNITY): Payer: Medicare Other | Admitting: General Practice

## 2019-12-30 ENCOUNTER — Telehealth (HOSPITAL_COMMUNITY): Payer: Self-pay | Admitting: Pharmacy Technician

## 2019-12-30 ENCOUNTER — Telehealth: Payer: Self-pay

## 2019-12-30 NOTE — Telephone Encounter (Signed)
Called [facility] and spoke with Mackie Pai to update patients profile with [employer] (TAF) billing information.  Mackie Pai has put in a request for the patients profile to be updated with grant information and the claim to be reprocessed and copay to be refunded to patient.  She states that this process can take up to 48 hours.  I will follow up on Friday to see if the claim has been reprocessed and refund applied.  Called Mr Meinhardt to let him know the information above and he was very appreciative for the help.  [facility] phone number to follow up: [phone].  [place] Patient [place] Phone [phone] Fax [phone] 12/30/2019 2:15 PM

## 2019-12-30 NOTE — Telephone Encounter (Signed)
Encounter opened in error

## 2019-12-30 NOTE — Telephone Encounter (Signed)
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  He is over income for their services.  He has called [facility] multiple times to discuss his situation, has never heard back from them.  "Do not understand what paperwork they need."  Legal Aid was to follow up w him w some basic information - he has not heard from them to date.  Asked Legal Aid/S Roselie Awkward to respond.  Pt also wants help getting his copay back from pharmacy now that he has been approved for patient assistance for drug replacement for cancer treatment medications. Message sent to pharmacy team asking them to reach out to patient to provide information needed by outpatient pharmacy to refund co pay.  [name], LCSW Clinical Social Worker Phone:  [phone]

## 2019-12-30 NOTE — Telephone Encounter (Addendum)
Oral Oncology Patient Advocate Encounter  Patient has been approved for copay assistance with [employer] (TAF).  [employer] [place] will cover all copayment expenses for Xtandi for the remainder of the calendar year.    The billing information is as follows and has been shared with [facility].   Member ID: [id number] Group ID: [id number] PCN: [id number] BIN: [id number] Eligibility Dates: 04/04/19 to 04/02/20  Fund: [place] Patient [place] Phone [phone] Fax [phone] 12/30/2019 10:26 AM

## 2019-12-30 NOTE — Telephone Encounter (Signed)
Oral Oncology Patient Advocate Encounter  Patient received Xtandi from [facility].  States his copay was $80 and that this will be unaffordable every month.  Signed patient up for [place] to cover copay until the end of the year.  We can apply for more grants for the new year.  [place] Patient [place] Phone [phone] Fax [phone] 12/30/2019 11:31 AM

## 2019-12-31 NOTE — Patient Instructions (Signed)
Visit Information  Goals Addressed            This Visit's Progress   . Chronic Diseaes Management Needs       CARE PLAN ENTRY (see longtitudinal plan of care for additional care plan information)  Current Barriers:  . Chronic Disease Management support, education, and care coordination needs related to HTN, CAD, COPD, neuropathy, CKD, HLD  Clinical Goal(s) related to HTN, CAD, COPD, neuropathy, CKD, HLD:  Over the next 60 days, patient will:  . Work with the care management team to address educational, disease management, and care coordination needs  . Begin or continue self health monitoring activities as directed today Measure and record blood pressure 4 times per week . Call provider office for new or worsened signs and symptoms Blood pressure findings outside established parameters . Call care management team with questions or concerns . Verbalize basic understanding of patient centered plan of care established today  Interventions related to HTN, CAD, COPD, neuropathy, CKD, HLD:  . Evaluation of current treatment plans and patient's adherence to plan as established by provider . Assessed patient understanding of disease states . Assessed patient's education and care coordination needs . Provided disease specific education to patient  . Collaborated with appropriate clinical care team members regarding patient needs  Patient Self Care Activities related to HTN, CAD, COPD, neuropathy, CKD, HLD :  . Patient is unable to independently self-manage chronic health conditions  Initial goal documentation        Patient verbalizes understanding of instructions provided today.   Follow-up Plan The care management team will reach out to the patient again over the next 60 days.   Chong Sicilian, BSN, RN-BC Embedded Chronic Care Manager [facility] Direct Dial: [phone]

## 2019-12-31 NOTE — Chronic Care Management (AMB) (Signed)
Chronic Care Management   Initial Visit Note  06/19/2019 Name: [name] MRN: [id number] DOB: 1945/06/01  Referred by: [name], [name], MD Reason for referral : Chronic Care Management (Initial Visit)   [name] is a 74 y.o. year old male who is a primary care patient of [name], [name], MD. The CCM team was consulted for assistance with chronic disease management and care coordination needs related to HTN, CAD, COPD, neuropathy, CKD, HLD.   Review of patient status, including review of consultants reports, relevant laboratory and other test results, and collaboration with appropriate care team members and the patient's provider was performed as part of comprehensive patient evaluation and provision of chronic care management services.    SDOH (Social Determinants of Health) assessments performed: Yes See Care Plan activities for detailed interventions related to SDOH     Medications: Outpatient Encounter Medications as of 06/19/2019  Medication Sig Note  . allopurinol (ZYLOPRIM) 300 MG tablet Take 1 tablet (300 mg total) by mouth daily.   [name] amLODipine (NORVASC) 10 MG tablet Take 1 tablet (10 mg total) by mouth daily. TAKE 1 TABLET BY MOUTH ONCE DAILY (NEEDS  TO  BE  SEEN  BEFORE  NEXT  REFILL)   . carvedilol (COREG) 25 MG tablet Take 1 tablet (25 mg total) by mouth 2 (two) times daily.   [name] gabapentin (NEURONTIN) 100 MG capsule Take 1 capsule (100 mg total) by mouth 3 (three) times daily. (Patient taking differently: Take 100 mg by mouth at bedtime. )   . losartan (COZAAR) 50 MG tablet Take 1 tablet (50 mg total) by mouth daily.   . [DISCONTINUED] diclofenac Sodium (VOLTAREN) 1 % GEL Apply 2 g topically 4 (four) times daily. (Patient not taking: Reported on 07/08/2019)   . [DISCONTINUED] DULoxetine (CYMBALTA) 60 MG capsule TAKE 1 CAPSULE BY MOUTH ONCE DAILY IN THE EVENING   . [DISCONTINUED] pantoprazole (PROTONIX) 40 MG tablet Take 1 tablet (40 mg total) by mouth 2  (two) times daily. 03/04/2019: Pt needs refilled   . [DISCONTINUED] pravastatin (PRAVACHOL) 20 MG tablet Take 1 tablet (20 mg total) by mouth daily.   . [DISCONTINUED] tamsulosin (FLOMAX) 0.4 MG CAPS capsule Take 1 capsule by mouth once daily (Patient not taking: Reported on 07/08/2019)   . [DISCONTINUED] tolterodine (DETROL LA) 4 MG 24 hr capsule Take 4 mg by mouth daily.    No facility-administered encounter medications on file as of 06/19/2019.      Goals Addressed            This Visit's Progress   . Chronic Diseaes Management Needs       CARE PLAN ENTRY (see longtitudinal plan of care for additional care plan information)  Current Barriers:  . Chronic Disease Management support, education, and care coordination needs related to HTN, CAD, COPD, neuropathy, CKD, HLD  Clinical Goal(s) related to HTN, CAD, COPD, neuropathy, CKD, HLD:  Over the next 60 days, patient will:  . Work with the care management team to address educational, disease management, and care coordination needs  . Begin or continue self health monitoring activities as directed today Measure and record blood pressure 4 times per week . Call provider office for new or worsened signs and symptoms Blood pressure findings outside established parameters . Call care management team with questions or concerns . Verbalize basic understanding of patient centered plan of care established today  Interventions related to HTN, CAD, COPD, neuropathy, CKD, HLD:  . Evaluation of current treatment  plans and patient's adherence to plan as established by provider . Assessed patient understanding of disease states . Assessed patient's education and care coordination needs . Provided disease specific education to patient  . Collaborated with appropriate clinical care team members regarding patient needs  Patient Self Care Activities related to HTN, CAD, COPD, neuropathy, CKD, HLD :  . Patient is unable to independently self-manage  chronic health conditions  Initial goal documentation         Plan:   The care management team will reach out to the patient again over the next 60 days.   Chong Sicilian, BSN, RN-BC Embedded Chronic Care Manager [facility] [place] [facility] Direct Dial: [phone]

## 2020-01-02 ENCOUNTER — Ambulatory Visit
Admission: RE | Admit: 2020-01-02 | Discharge: 2020-01-02 | Disposition: A | Discharge status: Home / Self Care | Payer: Medicare Other | Source: Ambulatory Visit | Attending: Neurology | Admitting: Neurology

## 2020-01-02 ENCOUNTER — Other Ambulatory Visit: Payer: Self-pay

## 2020-01-02 DIAGNOSIS — M47812 Spondylosis without myelopathy or radiculopathy, cervical region: Secondary | ICD-10-CM | POA: Diagnosis not present

## 2020-01-02 DIAGNOSIS — M5417 Radiculopathy, lumbosacral region: Secondary | ICD-10-CM

## 2020-01-02 DIAGNOSIS — M4802 Spinal stenosis, cervical region: Secondary | ICD-10-CM | POA: Diagnosis not present

## 2020-01-02 DIAGNOSIS — R296 Repeated falls: Secondary | ICD-10-CM | POA: Diagnosis not present

## 2020-01-02 DIAGNOSIS — R2689 Other abnormalities of gait and mobility: Secondary | ICD-10-CM | POA: Diagnosis not present

## 2020-01-02 MED ORDER — DIAZEPAM 5 MG PO TABS
5.0000 mg | ORAL_TABLET | Freq: Once | ORAL | Status: AC
  Administered 2020-01-02: 5 mg via ORAL

## 2020-01-02 MED ORDER — ONDANSETRON 4 MG PO TBDP
4.0000 mg | ORAL_TABLET | Freq: Once | ORAL | Status: AC
  Administered 2020-01-02: 4 mg via ORAL

## 2020-01-02 MED ORDER — IOPAMIDOL (ISOVUE-M 300) INJECTION 61%
10.0000 mL | Freq: Once | INTRAMUSCULAR | Status: AC
  Administered 2020-01-02: 10 mL via INTRATHECAL

## 2020-01-02 NOTE — Discharge Instructions (Signed)
Myelogram Discharge Instructions  1. Go home and rest quietly for the next 24 hours.  It is important to lie flat for the next 24 hours.  Get up only to go to the restroom.  You may lie in the bed or on a couch on your back, your stomach, your left side or your right side.  You may have one pillow under your head.  You may have pillows between your knees while you are on your side or under your knees while you are on your back.  2. DO NOT drive today.  Recline the seat as far back as it will go, while still wearing your seat belt, on the way home.  3. You may get up to go to the bathroom as needed.  You may sit up for 10 minutes to eat.  You may resume your normal diet and medications unless otherwise indicated.  Drink lots of extra fluids today and tomorrow.  4. The incidence of headache, nausea, or vomiting is about 5% (one in 20 patients).  If you develop a headache, lie flat and drink plenty of fluids until the headache goes away.  Caffeinated beverages may be helpful.  If you develop severe nausea and vomiting or a headache that does not go away with flat bed rest, call [phone].  5. You may resume normal activities after your 24 hours of bed rest is over; however, do not exert yourself strongly or do any heavy lifting tomorrow. If when you get up you have a headache when standing, go back to bed and force fluids for another 24 hours.  6. Call your physician for a follow-up appointment.  The results of your myelogram will be sent directly to your physician by the following day.  7. If you have any questions or if complications develop after you arrive home, please call [phone].  Discharge instructions have been explained to the patient.  The patient, or the person responsible for the patient, fully understands these instructions.  YOU MAY RESTART YOUR CYMBALTA AND TRAMADOL TOMORROW 01/03/20 AT 08:30AM.

## 2020-01-02 NOTE — Progress Notes (Signed)
Pt reports he has been off of his Cymbalta and Tramadol for at least 48 hours. Pt verbalized understanding that he can restart these medications tomorrow 01/03/20 at 8:30 AM.

## 2020-01-06 ENCOUNTER — Telehealth: Payer: Self-pay

## 2020-01-06 NOTE — Telephone Encounter (Signed)
Patient advised. He refused PT right now.

## 2020-01-06 NOTE — Telephone Encounter (Signed)
Nothing more that I can really do.  Physical therapy

## 2020-01-06 NOTE — Telephone Encounter (Signed)
Pt advised of his CT results. Pt wanted to know what the next steps are? Should he f/u with Dr. Tomi Likens

## 2020-01-06 NOTE — Telephone Encounter (Signed)
-----   Message from Pieter Partridge, DO sent at 01/06/2020 12:46 PM EDT ----- Myelogram does show a disc in the neck that is mildly pressing the spinal cord but I don't think that is enough to be causing the balance problems.  I think the balance problems are likely from his neuropathy in the feet.

## 2020-01-07 NOTE — Telephone Encounter (Signed)
Oral Oncology Patient Advocate Encounter  Called [facility] on 01/05/20 to check refund status.  Fatima S[place] information was inputted incorrectly which prevented reprocessing claim. I supplied rep with correct information and she was able to reprocess claim for $0.00.  Patients copay will be refunded to card and it may take 7-14 days to show the credit.  I called Mr Elpers and informed him that the refund may take 7-14 days.  He understood and was very grateful for the help.  [place] Patient [place] Phone [phone] Fax [phone] 01/07/2020 11:41 AM

## 2020-01-15 ENCOUNTER — Other Ambulatory Visit: Payer: Self-pay

## 2020-01-15 ENCOUNTER — Inpatient Hospital Stay (HOSPITAL_COMMUNITY): Payer: Medicare Other | Attending: Hematology | Admitting: Genetic Counselor

## 2020-01-15 DIAGNOSIS — Z79899 Other long term (current) drug therapy: Secondary | ICD-10-CM | POA: Insufficient documentation

## 2020-01-15 DIAGNOSIS — Z8051 Family history of malignant neoplasm of kidney: Secondary | ICD-10-CM | POA: Diagnosis not present

## 2020-01-15 DIAGNOSIS — Z1589 Genetic susceptibility to other disease: Secondary | ICD-10-CM | POA: Diagnosis not present

## 2020-01-15 DIAGNOSIS — Z1379 Encounter for other screening for genetic and chromosomal anomalies: Secondary | ICD-10-CM | POA: Diagnosis not present

## 2020-01-15 DIAGNOSIS — C61 Malignant neoplasm of prostate: Secondary | ICD-10-CM | POA: Diagnosis not present

## 2020-01-15 DIAGNOSIS — Z923 Personal history of irradiation: Secondary | ICD-10-CM | POA: Insufficient documentation

## 2020-01-15 DIAGNOSIS — I10 Essential (primary) hypertension: Secondary | ICD-10-CM | POA: Insufficient documentation

## 2020-01-15 DIAGNOSIS — N183 Chronic kidney disease, stage 3 unspecified: Secondary | ICD-10-CM | POA: Insufficient documentation

## 2020-01-15 DIAGNOSIS — F17210 Nicotine dependence, cigarettes, uncomplicated: Secondary | ICD-10-CM | POA: Insufficient documentation

## 2020-01-15 DIAGNOSIS — G4733 Obstructive sleep apnea (adult) (pediatric): Secondary | ICD-10-CM | POA: Insufficient documentation

## 2020-01-16 ENCOUNTER — Other Ambulatory Visit (HOSPITAL_COMMUNITY): Payer: Self-pay

## 2020-01-16 DIAGNOSIS — C61 Malignant neoplasm of prostate: Secondary | ICD-10-CM

## 2020-01-19 ENCOUNTER — Encounter (HOSPITAL_COMMUNITY): Payer: Self-pay | Admitting: Genetic Counselor

## 2020-01-19 ENCOUNTER — Encounter: Payer: Self-pay | Admitting: Genetic Counselor

## 2020-01-19 ENCOUNTER — Encounter (HOSPITAL_COMMUNITY): Payer: Self-pay | Admitting: Hematology

## 2020-01-19 ENCOUNTER — Inpatient Hospital Stay (HOSPITAL_BASED_OUTPATIENT_CLINIC_OR_DEPARTMENT_OTHER): Payer: Medicare Other | Admitting: Hematology

## 2020-01-19 ENCOUNTER — Other Ambulatory Visit: Payer: Self-pay

## 2020-01-19 ENCOUNTER — Inpatient Hospital Stay (HOSPITAL_COMMUNITY): Payer: Medicare Other

## 2020-01-19 VITALS — BP 142/75 | HR 58 | Temp 96.8°F | Resp 16 | Wt 288.8 lb

## 2020-01-19 DIAGNOSIS — G4733 Obstructive sleep apnea (adult) (pediatric): Secondary | ICD-10-CM | POA: Diagnosis not present

## 2020-01-19 DIAGNOSIS — N183 Chronic kidney disease, stage 3 unspecified: Secondary | ICD-10-CM | POA: Diagnosis not present

## 2020-01-19 DIAGNOSIS — Z79899 Other long term (current) drug therapy: Secondary | ICD-10-CM | POA: Diagnosis not present

## 2020-01-19 DIAGNOSIS — I10 Essential (primary) hypertension: Secondary | ICD-10-CM | POA: Diagnosis not present

## 2020-01-19 DIAGNOSIS — F17210 Nicotine dependence, cigarettes, uncomplicated: Secondary | ICD-10-CM | POA: Diagnosis not present

## 2020-01-19 DIAGNOSIS — R898 Other abnormal findings in specimens from other organs, systems and tissues: Secondary | ICD-10-CM | POA: Insufficient documentation

## 2020-01-19 DIAGNOSIS — C61 Malignant neoplasm of prostate: Secondary | ICD-10-CM

## 2020-01-19 DIAGNOSIS — Z8051 Family history of malignant neoplasm of kidney: Secondary | ICD-10-CM | POA: Insufficient documentation

## 2020-01-19 DIAGNOSIS — Z1589 Genetic susceptibility to other disease: Secondary | ICD-10-CM | POA: Insufficient documentation

## 2020-01-19 DIAGNOSIS — Z1379 Encounter for other screening for genetic and chromosomal anomalies: Secondary | ICD-10-CM | POA: Insufficient documentation

## 2020-01-19 DIAGNOSIS — Z923 Personal history of irradiation: Secondary | ICD-10-CM | POA: Diagnosis not present

## 2020-01-19 LAB — CBC WITH DIFFERENTIAL/PLATELET
Abs Immature Granulocytes: 0.03 10*3/uL (ref 0.00–0.07)
Basophils Absolute: 0 10*3/uL (ref 0.0–0.1)
Basophils Relative: 1 %
Eosinophils Absolute: 0.4 10*3/uL (ref 0.0–0.5)
Eosinophils Relative: 6 %
HCT: 36.8 % — ABNORMAL LOW (ref 39.0–52.0)
Hemoglobin: 12 g/dL — ABNORMAL LOW (ref 13.0–17.0)
Immature Granulocytes: 1 %
Lymphocytes Relative: 9 %
Lymphs Abs: 0.6 10*3/uL — ABNORMAL LOW (ref 0.7–4.0)
MCH: 30 pg (ref 26.0–34.0)
MCHC: 32.6 g/dL (ref 30.0–36.0)
MCV: 92 fL (ref 80.0–100.0)
Monocytes Absolute: 0.3 10*3/uL (ref 0.1–1.0)
Monocytes Relative: 5 %
Neutro Abs: 5.2 10*3/uL (ref 1.7–7.7)
Neutrophils Relative %: 78 %
Platelets: 202 10*3/uL (ref 150–400)
RBC: 4 MIL/uL — ABNORMAL LOW (ref 4.22–5.81)
RDW: 15.1 % (ref 11.5–15.5)
WBC: 6.6 10*3/uL (ref 4.0–10.5)
nRBC: 0 % (ref 0.0–0.2)

## 2020-01-19 LAB — COMPREHENSIVE METABOLIC PANEL
ALT: 14 U/L (ref 0–44)
AST: 17 U/L (ref 15–41)
Albumin: 3.9 g/dL (ref 3.5–5.0)
Alkaline Phosphatase: 66 U/L (ref 38–126)
Anion gap: 13 (ref 5–15)
BUN: 29 mg/dL — ABNORMAL HIGH (ref 8–23)
CO2: 24 mmol/L (ref 22–32)
Calcium: 8.5 mg/dL — ABNORMAL LOW (ref 8.9–10.3)
Chloride: 104 mmol/L (ref 98–111)
Creatinine, Ser: 2.58 mg/dL — ABNORMAL HIGH (ref 0.61–1.24)
GFR, Estimated: 23 mL/min — ABNORMAL LOW (ref >60)
Glucose, Bld: 112 mg/dL — ABNORMAL HIGH (ref 70–99)
Potassium: 3.7 mmol/L (ref 3.5–5.1)
Sodium: 141 mmol/L (ref 135–145)
Total Bilirubin: 0.7 mg/dL (ref 0.3–1.2)
Total Protein: 7.4 g/dL (ref 6.5–8.1)

## 2020-01-19 LAB — PSA: Prostatic Specific Antigen: 0.81 ng/mL (ref 0.00–4.00)

## 2020-01-19 NOTE — Patient Instructions (Signed)
[place] at [facility] Discharge Instructions  You were seen today by Dr. Delton Coombes. He went over your recent results. Start taking 3 tablets of enzalutamide daily. Dr. Delton Coombes will see you back in 3 weeks for labs and follow up.   Thank you for choosing [place] at [facility] to provide your oncology and hematology care.  To afford each patient quality time with our provider, please arrive at least 15 minutes before your scheduled appointment time.   If you have a lab appointment with the [facility] please come in thru the Main Entrance and check in at the main information desk  You need to re-schedule your appointment should you arrive 10 or more minutes late.  We strive to give you quality time with our providers, and arriving late affects you and other patients whose appointments are after yours.  Also, if you no show three or more times for appointments you may be dismissed from the clinic at the providers discretion.     Again, thank you for choosing [facility].  Our hope is that these requests will decrease the amount of time that you wait before being seen by our physicians.       _____________________________________________________________  Should you have questions after your visit to [facility], please contact our office at [phone] between the hours of 8:00 a.m. and 4:30 p.m.  Voicemails left after 4:00 p.m. will not be returned until the following business day.  For prescription refill requests, have your pharmacy contact our office and allow 72 hours.    Cancer Center Support Programs:   > Cancer Support Group  2nd Tuesday of the month 1pm-2pm, Journey Room

## 2020-01-19 NOTE — Progress Notes (Signed)
[address], [place] [address]   CLINIC:  Medical Oncology/Hematology  PCP:  [name], [name], MD [place] / [place] [address] [phone]   REASON FOR VISIT:  Follow-up for recurrent prostate cancer  PRIOR THERAPY: External beam radiation therapy with bicalutamide  NGS Results: Not done  CURRENT THERAPY: Enzalutamide daily  BRIEF ONCOLOGIC HISTORY:  Oncology History   No history exists.    CANCER STAGING: Cancer Staging Prostate cancer [facility]) Staging form: Prostate, AJCC 7th Edition - Clinical: Stage IV (pT3b, N1, M0) - Signed by Wyatt Portela, MD on 07/09/2013   INTERVAL HISTORY:  [name], a 74 y.o. male, returns for routine follow-up of his recurrent prostate cancer. [name] was last seen on 12/23/2019.  Today he reports feeling okay. He is taking 2 tablets of Xtandi daily and is tolerating it well, denying N/V; he started taking it on 9/28. He is taking Lyrica BID and his feet still feel like they are freezing; he denies feeling drowsy with it. He wakes up 10-15 times per night to urinate. He denies having any falls recently. He is not on any blood thinners.  He will see Dr. [name] on 10/29. He will go to [place] today for 1 week.   REVIEW OF SYSTEMS:  Review of Systems  Constitutional: Positive for fatigue (depleted). Negative for appetite change.  Respiratory: Positive for shortness of breath (w/ exertion).   Cardiovascular: Positive for leg swelling (feet swelling).  Gastrointestinal: Negative for nausea and vomiting.  Genitourinary: Positive for nocturia.   Neurological: Negative for dizziness.  Psychiatric/Behavioral: Positive for depression and sleep disturbance (d/t nocturia). The patient is nervous/anxious.   All other systems reviewed and are negative.   PAST MEDICAL/SURGICAL HISTORY:  Past Medical History:  Diagnosis Date  . Aortic atherosclerosis ([place])    2004  s/p  closure Penetrating  atherosclerotic ulcer of infarenal aorta w/ endovascular stent graft  . Arthritis   . CKD (chronic kidney disease), stage III ([place])   . Closed fracture of head of humerus 07/2017   right shoulder from fall; 09-25-2017 per pt no surgical intervention, only wore sling, intermittant pain  . Coronary atherosclerosis of native coronary artery    positive myoview for ischemia 09-27-2009;  10-01-2009 per cardiac cath-- minimal nonobstructive CAD w/ 30% LAD   . ED (erectile dysfunction)   . Emphysema/COPD [facility])    followed by pcp--- last exacerbation 09-12-2017;  09-25-2017 per pt no cough, sob or congestion  . Essential hypertension   . First degree heart block   . Frequency of urination   . GERD (gastroesophageal reflux disease)   . Gout    09-25-2017  per pt stable,  last episode 2015  . Heart murmur   . History of adenomatous polyp of colon   . History of closed head injury 2005   per pt residual resolved  . History of external beam radiation therapy    completed 10/ 2015 for prostate cancer  . History of gastric ulcer 2014  . History of kidney stones   . History of pneumothorax    spontaneous pneumo treated w/ chest tube  . History of rib fracture 07/2017   from fall,  right side 6th,7th,8th  . Neuropathy   . OAB (overactive bladder)   . OSA (obstructive sleep apnea)    Intolerant of CPAP  . Prostate cancer ([place])    dx 09-26-2011 via bx-- Stage T3b,N0,  Gleason 4+4, PSA  11.2 with METs to external iliac lymph node(resolved with ADT)-- treated w/ ADT ;   05/2013 staging work-up for rising PSA , started casodex and completed radiation therapy 10/ 2015;   rising PSA post treatment  . RLS (restless legs syndrome)   . Ureteral neocystostomy bleed    left side  . Urge urinary incontinence    Past Surgical History:  Procedure Laterality Date  . ABDOMINAL AORTIC ENDOVASCULAR STENT GRAFT  09-13-2007    dr [name]  [facility]   closure penetrating atherosclerotic ulcer of infarenal aorta with  stent graft  . BIOPSY  03/06/2019   Procedure: BIOPSY;  Surgeon: [name], MD;  [facility];  Service: Endoscopy;;  gastric  . CARDIAC CATHETERIZATION  2005   per pt normal (done in [place])  . CARDIAC CATHETERIZATION  10/06/2009    dr cooper   minimal nonobstructive CAD w/30% LAD otherwise normal coronaries, LVEDP 63mmHg  . CARDIOVASCULAR STRESS TEST  09/27/2009    dr Aundra Dubin   lexiscan nuclear study w/ moderate reversible inferior perfusion defect ischemia,  ef 60% (cardiac cath scheduled)  . CHEST TUBE INSERTION  1989   "collapsed lung"due to injury  . CIRCUMCISION  09/26/2011   Procedure: CIRCUMCISION ADULT;  Surgeon: [name], MD;  Location: [facility];  Service: Urology;  Laterality: N/A;  . COLONOSCOPY W/ POLYPECTOMY    . COLONOSCOPY WITH PROPOFOL N/A 04/27/2016    eight 4-8 mm polyps in descending colon, at splenic flexure, in ascending colon and cecum. Tubular adenomas. Surveillance in 3 years.  . CYSTOSCOPY  09/26/2011   Procedure: CYSTOSCOPY;  Surgeon: [name], MD;  Location: [facility];  Service: Urology;  Laterality: N/A;  . CYSTOSCOPY/RETROGRADE/URETEROSCOPY Left 09/27/2017   Procedure: CYSTOSCOPYLEFT Marlow Baars AND RENAL WASHINGS;  Surgeon: [name], MD;  Location: [facility];  Service: Urology;  Laterality: Left;  . ESOPHAGOGASTRODUODENOSCOPY (EGD) WITH PROPOFOL N/A 04/27/2016   normal esophagus s/p dilation, small hiatal hernia, normal duodenum  . ESOPHAGOGASTRODUODENOSCOPY (EGD) WITH PROPOFOL N/A 03/06/2019   erosive reflux esophagitis, s/p dilation, normal duodenum, abnormal gastric mucosa s/p biopsy. Hyperplastic gastric polyp. No H.pylori.   [name] EXTRACORPOREAL SHOCK WAVE LITHOTRIPSY  yrs ago  . FRACTURE SURGERY  child   Bilateral lower arms   . KNEE ARTHROSCOPY Right 2013  . MALONEY DILATION N/A 04/27/2016   Procedure: Venia Minks DILATION;  Surgeon: [name], MD;  [facility];  Service: Endoscopy;   Laterality: N/A;  . Venia Minks DILATION N/A 03/06/2019   Procedure: Venia Minks DILATION;  Surgeon: [name], MD;  [facility];  Service: Endoscopy;  Laterality: N/A;  . PARTIAL KNEE ARTHROPLASTY Right 04/26/2015   Procedure: RIGHT KNEE MEDIAL UNICOMPARTMENTAL ARTHROPLASTY;  Surgeon: Gaynelle Arabian, MD;  [facility];  Service: Orthopedics;  Laterality: Right;  . POLYPECTOMY  04/27/2016   Procedure: POLYPECTOMY;  Surgeon: [name], MD;  [facility];  Service: Endoscopy;;  cecal , ascending, descending, and sigmoid polypectomies  . PROSTATE BIOPSY  09/26/2011   Procedure: BIOPSY TRANSRECTAL ULTRASONIC PROSTATE (TUBP);  Surgeon: [name], MD;  [facility];  Service: Urology;  Laterality: N/A;     . TRANSTHORACIC ECHOCARDIOGRAM  01-07-2013   dr [name]   ef 60-65%,  grade 1 diastolic dysfunction/  mild AR without stenosis/  mild LAE/  mild to moderate calcificed MV annulus without regurg. or stenosis/  . UMBILICAL HERNIA REPAIR  yrs ago    SOCIAL HISTORY:  Social  History   Socioeconomic History  . Marital status: Married    Spouse name: Not on file  . Number of children: 3  . Years of education: 56  . Highest education level: Some college, no degree  Occupational History  . Occupation: Retired    Comment: Hotel manager    Comment: Part Time at [employer]  . Smoking status: Current Every Day Smoker    Packs/day: 0.50    Years: 42.00    Pack years: 21.00    Types: Cigarettes  . Smokeless tobacco: Never Used  Vaping Use  . Vaping Use: Never used  Substance and Sexual Activity  . Alcohol use: No    Alcohol/week: 0.0 standard drinks  . Drug use: No  . Sexual activity: Yes    Birth control/protection: None  Other Topics Concern  . Not on file  Social History Narrative   Patient is retired from the department of [employer]. He lives in a one story home with his wife. He moved from [facility] about 9 years ago. They do not have  family on the [place] and although he is very outgoing he feels socially isolated. He isn't a member of any groups or churches.    Social Determinants of Health   Financial Resource Strain: High Risk  . Difficulty of Paying Living Expenses: Hard  Food Insecurity:   . Worried About Charity fundraiser in the Last Year: Not on file  . Ran Out of Food in the Last Year: Not on file  Transportation Needs: No Transportation Needs  . Lack of Transportation (Medical): No  . Lack of Transportation (Non-Medical): No  Physical Activity:   . Days of Exercise per Week: Not on file  . Minutes of Exercise per Session: Not on file  Stress:   . Feeling of Stress : Not on file  Social Connections:   . Frequency of Communication with Friends and Family: Not on file  . Frequency of Social Gatherings with Friends and Family: Not on file  . Attends Religious Services: Not on file  . Active Member of Clubs or Organizations: Not on file  . Attends Archivist Meetings: Not on file  . Marital Status: Not on file  Intimate Partner Violence: Not At Risk  . Fear of Current or Ex-Partner: No  . Emotionally Abused: No  . Physically Abused: No  . Sexually Abused: No    FAMILY HISTORY:  Family History  Problem Relation Age of Onset  . Stroke Mother   . Alzheimer's disease Mother 69       probably due to head injury  . Mesothelioma Father   . Cancer Father        mesothelioma  . Hyperlipidemia Sister   . Heart attack Sister   . Cancer Brother 41  . Heart disease Brother   . Hyperlipidemia Brother   . Cancer Brother        kidney  . Healthy Daughter   . Healthy Son   . Healthy Son   . Colon cancer Neg Hx     CURRENT MEDICATIONS:  Current Outpatient Medications  Medication Sig Dispense Refill  . allopurinol (ZYLOPRIM) 300 MG tablet Take 1 tablet (300 mg total) by mouth daily. 90 tablet 3  . amLODipine (NORVASC) 10 MG tablet Take 1 tablet (10 mg total) by mouth daily. TAKE 1 TABLET  BY MOUTH ONCE DAILY (NEEDS  TO  BE  SEEN  BEFORE  NEXT  REFILL) 90 tablet 3  . carvedilol (COREG) 25 MG tablet Take 1 tablet (25 mg total) by mouth 2 (two) times daily. 180 tablet 3  . DULoxetine (CYMBALTA) 60 MG capsule Take 1 capsule by mouth once daily 30 capsule 2  . enzalutamide (XTANDI) 40 MG tablet Take 4 tablets (160 mg total) by mouth daily. 120 tablet 0  . losartan (COZAAR) 50 MG tablet Take 1 tablet (50 mg total) by mouth daily. 90 tablet 3  . mirabegron ER (MYRBETRIQ) 25 MG TB24 tablet Take 1 tablet (25 mg total) by mouth daily. 28 tablet 0  . pantoprazole (PROTONIX) 40 MG tablet Take 40 mg by mouth 2 (two) times daily.    . pregabalin (LYRICA) 25 MG capsule Take 1 capsule (25 mg total) by mouth 2 (two) times daily. 60 capsule 1  . rosuvastatin (CRESTOR) 20 MG tablet Take 1 tablet (20 mg total) by mouth daily. 90 tablet 3  . traMADol (ULTRAM) 50 MG tablet Take 1 tablet (50 mg total) by mouth daily as needed. 30 tablet 1   No current facility-administered medications for this visit.    ALLERGIES:  Allergies  Allergen Reactions  . Carbidopa-Levodopa Other (See Comments)    hallunications  . Morphine Sulfate Nausea And Vomiting  . Sulfa Antibiotics Nausea And Vomiting  . Sulfacetamide Sodium Nausea And Vomiting    PHYSICAL EXAM:  Performance status (ECOG): 1 - Symptomatic but completely ambulatory  Vitals:   01/19/20 1155  BP: (!) 142/75  Pulse: (!) 58  Resp: 16  Temp: (!) 96.8 F (36 C)  SpO2: 98%   Wt Readings from Last 3 Encounters:  01/19/20 288 lb 12.8 oz (131 kg)  12/23/19 285 lb 3.2 oz (129.4 kg)  12/03/19 287 lb (130.2 kg)   Physical Exam Vitals reviewed.  Constitutional:      Appearance: Normal appearance. He is obese.  Neurological:     General: No focal deficit present.     Mental Status: He is alert and oriented to person, place, and time.  Psychiatric:        Mood and Affect: Mood normal.        Behavior: Behavior normal.      LABORATORY  DATA:  I have reviewed the labs as listed.  CBC Latest Ref Rng & Units 01/19/2020 06/16/2019 04/08/2018  WBC 4.0 - 10.5 K/uL 6.6 6.5 6.4  Hemoglobin 13.0 - 17.0 g/dL 12.0(L) 11.7(L) 13.3  Hematocrit 39 - 52 % 36.8(L) 34.4(L) 39.2  Platelets 150 - 400 K/uL 202 234 262   CMP Latest Ref Rng & Units 01/19/2020 08/13/2019 06/16/2019  Glucose 70 - 99 mg/dL 112(H) - 81  BUN 8 - 23 mg/dL 29(H) - 24  Creatinine 0.61 - 1.24 mg/dL 2.58(H) - 2.16(H)  Sodium 135 - 145 mmol/L 141 - 139  Potassium 3.5 - 5.1 mmol/L 3.7 - 3.9  Chloride 98 - 111 mmol/L 104 - 103  CO2 22 - 32 mmol/L 24 - 23  Calcium 8.9 - 10.3 mg/dL 8.5(L) - 8.7  Total Protein 6.5 - 8.1 g/dL 7.4 6.9 6.5  Total Bilirubin 0.3 - 1.2 mg/dL 0.7 - <0.2  Alkaline Phos 38 - 126 U/L 66 - 91  AST 15 - 41 U/L 17 - 15  ALT 0 - 44 U/L 14 - 12    DIAGNOSTIC IMAGING:  I have independently reviewed the scans and discussed with the patient. CT CERVICAL SPINE W CONTRAST  Result Date: 01/02/2020 CLINICAL DATA:  Spinal stenosis.  Imbalance with  multiple falls. EXAM: CERVICAL MYELOGRAM CT CERVICAL MYELOGRAM FLUOROSCOPY TIME:  Fluoroscopy Time: 1 minute 57 seconds Radiation Exposure Index: 422.27 microGray*m^2 PROCEDURE: LUMBAR PUNCTURE FOR CERVICAL MYELOGRAM After thorough discussion of risks and benefits of the procedure including bleeding, infection, injury to nerves, blood vessels, adjacent structures as well as headache and CSF leak, written and oral informed consent was obtained. Consent was obtained by Dr. Logan Bores. We discussed the high likelihood of obtaining a diagnostic study. Patient was positioned prone on the fluoroscopy table. Local anesthesia was provided with 1% lidocaine without epinephrine after prepped and draped in the usual sterile fashion. Puncture was performed at L2-3 using a 5 inch 22-gauge spinal needle via a left interlaminar approach. Using a single pass through the dura, the needle was placed within the thecal sac, with return of  clear CSF. 10 mL of Isovue M-300 was injected into the thecal sac, with normal opacification of the nerve roots and cauda equina consistent with free flow within the subarachnoid space. The patient was then moved to the Trendelenburg position and contrast flowed into the cervical spine region. I personally performed the lumbar puncture and administered the intrathecal contrast. I also personally supervised acquisition of the myelogram images. TECHNIQUE: Contiguous axial images were obtained through the cervical spine after the intrathecal infusion of contrast. Coronal and sagittal reconstructions were obtained of the axial image sets. COMPARISON:  Noncontrast cervical spine CT 11/16/2009 FINDINGS: CERVICAL MYELOGRAM FINDINGS: Vertebral alignment is normal. There is a small ventral extradural defect at C4-5 with at most mild spinal stenosis. Cervical spine assessment more inferiorly is limited on the lateral images due to superimposed soft tissues. No nerve root cut off is seen on AP or oblique images. CT CERVICAL MYELOGRAM FINDINGS: Vertebral alignment is normal. No fracture or suspicious osseous lesion is identified. There is mild cervical carotid atherosclerosis. The visualized lung apices are clear. C2-3: Small central disc protrusion without stenosis. C3-4: Mild disc bulging and uncovertebral spurring without significant stenosis. C4-5: Mild disc bulging and uncovertebral spurring result in borderline spinal stenosis and mild right neural foraminal stenosis. C5-6: Mild disc space narrowing. A broad-based posterior disc osteophyte complex including a prominent central component results in moderate spinal stenosis (7 mm AP spinal canal diameter) with mild cord flattening and mild-to-moderate right neural foraminal stenosis, similar to the prior noncontrast CT. C6-7: Negative. C7-T1: Negative. IMPRESSION: 1. Cervical spondylosis primarily at C5-6 where there is moderate spinal stenosis and mild-to-moderate right  neural foraminal stenosis. 2. Mild right neural foraminal stenosis at C4-5. Electronically Signed   By: Logan Bores M.D.   On: 01/02/2020 14:17   DG MYELOGRAPHY LUMBAR INJ CERVICAL  Result Date: 01/02/2020 CLINICAL DATA:  Spinal stenosis.  Imbalance with multiple falls. EXAM: CERVICAL MYELOGRAM CT CERVICAL MYELOGRAM FLUOROSCOPY TIME:  Fluoroscopy Time: 1 minute 57 seconds Radiation Exposure Index: 422.27 microGray*m^2 PROCEDURE: LUMBAR PUNCTURE FOR CERVICAL MYELOGRAM After thorough discussion of risks and benefits of the procedure including bleeding, infection, injury to nerves, blood vessels, adjacent structures as well as headache and CSF leak, written and oral informed consent was obtained. Consent was obtained by Dr. Logan Bores. We discussed the high likelihood of obtaining a diagnostic study. Patient was positioned prone on the fluoroscopy table. Local anesthesia was provided with 1% lidocaine without epinephrine after prepped and draped in the usual sterile fashion. Puncture was performed at L2-3 using a 5 inch 22-gauge spinal needle via a left interlaminar approach. Using a single pass through the dura, the needle was placed within the  thecal sac, with return of clear CSF. 10 mL of Isovue M-300 was injected into the thecal sac, with normal opacification of the nerve roots and cauda equina consistent with free flow within the subarachnoid space. The patient was then moved to the Trendelenburg position and contrast flowed into the cervical spine region. I personally performed the lumbar puncture and administered the intrathecal contrast. I also personally supervised acquisition of the myelogram images. TECHNIQUE: Contiguous axial images were obtained through the cervical spine after the intrathecal infusion of contrast. Coronal and sagittal reconstructions were obtained of the axial image sets. COMPARISON:  Noncontrast cervical spine CT 11/16/2009 FINDINGS: CERVICAL MYELOGRAM FINDINGS: Vertebral alignment  is normal. There is a small ventral extradural defect at C4-5 with at most mild spinal stenosis. Cervical spine assessment more inferiorly is limited on the lateral images due to superimposed soft tissues. No nerve root cut off is seen on AP or oblique images. CT CERVICAL MYELOGRAM FINDINGS: Vertebral alignment is normal. No fracture or suspicious osseous lesion is identified. There is mild cervical carotid atherosclerosis. The visualized lung apices are clear. C2-3: Small central disc protrusion without stenosis. C3-4: Mild disc bulging and uncovertebral spurring without significant stenosis. C4-5: Mild disc bulging and uncovertebral spurring result in borderline spinal stenosis and mild right neural foraminal stenosis. C5-6: Mild disc space narrowing. A broad-based posterior disc osteophyte complex including a prominent central component results in moderate spinal stenosis (7 mm AP spinal canal diameter) with mild cord flattening and mild-to-moderate right neural foraminal stenosis, similar to the prior noncontrast CT. C6-7: Negative. C7-T1: Negative. IMPRESSION: 1. Cervical spondylosis primarily at C5-6 where there is moderate spinal stenosis and mild-to-moderate right neural foraminal stenosis. 2. Mild right neural foraminal stenosis at C4-5. Electronically Signed   By: Logan Bores M.D.   On: 01/02/2020 14:17     ASSESSMENT:  1. Castration resistant prostate cancer: -T3 N1 M0 Gleason 4+4=8 prostate cancer with metastatic adenopathy in the right iliac chain diagnosed on 09/26/2011, PSA 11.2, treated with ADT only and PSA nadired at around 0.12 in February 2014. Subsequently slowly rising PSA to 0.32 in October 2014 and 0.66 in February 2015. Staging scans in March 2015 -. He was treated with salvage radiation therapy with bicalutamide. ADT was continued until March 2018. Staging work-up at diagnosis was negative for bone mets. -PSA 0.81 (09/24/2017), 1.6 (9/19), 1.3 (03/2019) 3.2 (07/18/2019), 4.2  (11/14/2019), testosterone less than 3. -Fluciclovine PET scan on 08/07/2019 showed asymmetric activity in the right prostate gland representing residual carcinoma with no evidence of metastatic adenopathy and no skeletal lesions. -Last Lupron injection 45 mg on 07/18/2019. -Fluciclovine PET scan on 12/18/2019 shows persistent hypermetabolic involving the prostate, slightly increased on the left.  Similar left and slight increase in right inguinal nodal low-level hypermetabolism, favored to be reactive but warranting follow-up.  Increased hypermetabolic within the enlarging thoracic lymph nodes, cannot exclude atypical distribution of nodal metastasis. -Enzalutamide started around 01/07/2020.  2. Social/family history: -Half pack per day current active smoker for 37 years. -Father and 2 brothers had kidney cancer.   PLAN:  1. Metastatic castration resistant prostate cancer: -Enzalutamide 80 mg daily started around 01/07/2020. -He denies any tiredness or any other side effects since enzalutamide was started. -Reviewed his labs today.  Creatinine went up slightly to 2.58.  LFTs are normal. -PSA improved to 0.81. -We will increase enzalutamide to 120 mg daily. -We will see him back in 3 weeks with repeat labs.  2.  Neuropathy: -He is taking Lyrica 25  mg twice daily which has helped but not completely. -I am reluctant to increase the dose at this time because of his kidney problems and potential side effects.   Orders placed this encounter:  Orders Placed This Encounter  Procedures  . CBC with Differential/Platelet  . Comprehensive metabolic panel     Derek Jack, MD [place] [phone]   I, Milinda Antis, am acting as a scribe for Dr. Sanda Linger.  I, Derek Jack MD, have reviewed the above documentation for accuracy and completeness, and I agree with the above.

## 2020-01-19 NOTE — Progress Notes (Addendum)
GENETIC TEST RESULTS   Patient Name: [name] Patient Age: 74 y.o. Encounter Date: 01/15/2020  Referring Provider: Derek Jack, MD    I connected with [name] on 01/15/20 at 11:00 am EDT by video conference and verified that I am speaking with the correct person using two identifiers.   Patient location: [facility] Provider location: [facility]   HISTORY OF PRESENT ILLNESS:   [name], a 74 year old male, was seen for a [place] cancer genetics consultation at the request of Dr. Delton Hanson due to a personal and family history of cancer.  [name] presents to clinic today to discuss the possibility of a hereditary predisposition to cancer, genetic testing, and to further clarify his future cancer risks, as well as potential cancer risks for family members. [name] recent genetic test results were disclosed to him, as were recommendations warranted by these results. These results and recommendations are discussed in more detail below.  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   FAMILY HISTORY:  We obtained a detailed, 4-generation family history.  Significant diagnoses are listed below: Family History  Problem Relation Age of Onset  . Stroke Mother   . Alzheimer's disease Mother 51       probably due to head injury  . Mesothelioma Father   . Cancer Father        mesothelioma  . Hyperlipidemia Sister   . Heart attack Sister   . Cancer Brother 54  . Heart disease Brother   . Hyperlipidemia Brother   . Cancer Brother        kidney  . Healthy Daughter   . Healthy Son   . Healthy Son   . Colon cancer Neg Hx    [name] has two sons (ages 42 and 48) and one daughter (age 57). He has two brothers who were both diagnosed with kidney cancer in their 75s. He had one sister who was diagnosed with kidney cancer in her 59s and died at the age of 40.    [name] mother died at the age of 40 and did not have cancer. He had four maternal aunts and one maternal uncle. He does not have information about his maternal grandparents. There are no known diagnoses of cancer on the maternal side of the family.  [name] father died at the age of 31 and had kidney cancer and mesothelioma. He notes that his father did have exposure to asbestos throughout his life. [name] had three paternal uncles and three paternal aunts. His paternal grandmother was in her 62s when she died, and he does not have information about his paternal grandfather. There are no other known diagnoses of cancer on the paternal side of the family.  Patient's ancestors are of [place] and [place] descent. There is 1% reported Jewish ancestry according to an [place] DNA test. There is no known consanguinity.  GENETIC TESTING:  Genetic testing reported out on 12/26/19 through the Multi-Cancer Panel offered by [facility]. 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   The Multi-Cancer Panel offered by [facility] includes sequencing and/or deletion duplication testing of the following 85 genes: AIP, ALK, APC, ATM, AXIN2,BAP1,  BARD1, BLM, BMPR1A, BRCA1, BRCA2, BRIP1, CASR, CDC73, CDH1, CDK4, CDKN1B, CDKN1C, CDKN2A (p14ARF), CDKN2A (p16INK4a), CEBPA, CHEK2, CTNNA1, DICER1, DIS3L2, EGFR (c.2369C>T, p.Thr790Met variant only), EPCAM (Deletion/duplication testing only), FH,  FLCN, GATA2, GPC3, GREM1 (Promoter region deletion/duplication testing only), HOXB13 (c.251G>A, p.Gly84Glu), HRAS, KIT, MAX, MEN1, MET, MITF (c.952G>A, p.Glu318Lys variant only), MLH1, MSH2, MSH3, MSH6, MUTYH, NBN, NF1, NF2, NTHL1, PALB2, PDGFRA, PHOX2B, PMS2, POLD1, POLE, POT1, PRKAR1A, PTCH1, PTEN, RAD50, RAD51C, RAD51D, RB1, RECQL4, RET, RNF43, RUNX1, SDHAF2, SDHA (sequence changes only), SDHB, SDHC, SDHD, SMAD4, SMARCA4, SMARCB1, SMARCE1, STK11, SUFU, TERC,  TERT, TMEM127, TP53, TSC1, TSC2, VHL, WRN and WT1.     [name] result does not explain his personal history of prostate cancer.  We discussed with [name] that because current genetic testing is not perfect, it is possible there may be a gene mutation in one of these genes that current testing cannot detect, but that chance is small.  We also discussed that there could be another gene that has not yet been discovered, or that we have not yet tested, that is responsible for the cancer diagnoses in the family. It is also possible there is a hereditary cause for the cancer in the family that [name] did not inherit and therefore was not identified in his testing. Therefore, it is important to remain in touch with cancer genetics in the future so that we can continue to offer [name] the most up to date genetic testing.   Genetic testing also identified a variant of uncertain significance (VUS) was identified in the TERT gene called c.2371G>A (p.Val791Ile).  At this time, it is unknown if this variant is associated with increased cancer risk or if this is a normal finding, but most variants such as this get reclassified to being inconsequential. It should not be used to make medical management decisions. With time, we suspect the lab will determine the significance of this variant, if any. If we do learn more about it, we will try to contact [name] to discuss it further. However, it is important to stay in touch with [place] periodically and keep the address and phone number up to date.  UPDATE:  The TERT c.2371G>A VUS was reclassified to "Likely Benign" on 06/01/2020. The change in variant classification was made as a result of re-review of the evidence in light of new variant interpretation guidelines and/or new information.    EXPLANATION OF RESULTS  Clinical condition:  The TMEM127 gene is associated with the development of pheochromocytomas (PCCs) and/or paragangliomas (PGLs) as  seen in hereditary paraganglioma-pheochromocytoma syndrome (PMID: 78469629, 52841324, 40102725, 36644034). It has also been suggested that TMEM127 variants are associated with other cancers, such as renal cell carcinoma; however, the data is currently limited and emerging (PMID: 74259563, 87564332). The clinical presentation is highly variable and may be difficult to predict. While an individual with a TMEM127 pathogenic variant will not necessarily develop cancer in their lifetime, the risk for cancer is increased over the general population risk.  Hereditary Paraganglioma-Pheochromocytoma syndrome:  Paragangliomas (PGL) are rare, adult-onset, typically benign neuroendocrine tumors that arise from paraganglia. Paraganglia are a collection of neuroendocrine tissues that are distributed throughout the body, from the middle ear and skull base to the pelvis. PGLs that develop in the head and neck are called head-and-neck paragangliomas (HNP). PGLs located outside the head and neck most commonly occur in the adrenal glands and are called pheochromocytomas [facility]), also known as chromaffin tumors. PCCs are catecholamine-secreting PGLs that are confined to the adrenal medulla, as defined by the The World Health Organization Tumor Classification; however, this term may also be used to refer to catecholamine-producing PGLs regardless of whether they are adrenal or  extra-adrenal (PMID: 30092330). These lesions can cause excessive production of adrenal hormones, resulting in hypertension, headaches, tachycardia, anxiety, and sweaty or clammy skin in some individuals (PMID: 07622633, 35456256, 38937342). Most cases of PGL and [place] are sporadic, but approximately one-third are familial and due to an identifiable pathogenic variant in a susceptibility gene, such as TMEM127, that can result in hereditary paraganglioma-pheochromocytoma syndrome (PMID: 87681157, 26203559, 74163845, 36468032, 12248250, 03704888, 91694503).  Current  literature suggests that individuals with a pathogenic TMEM127 variant most often present with benign, unilateral PCCs mimicking sporadic [place], but less commonly can also develop either bilateral PCCs, HNPs or abdominal extra-adrenal PGLs (PMID: 88828003, 49179150). The risk of malignancy appears to be low (possibly as low as ~1%) (PMID: 56979480, 16553748, 27078675). Affected individuals are typically diagnosed later in life (the mean age at diagnosis is 66 years), and there may be no other significant family history (PMID: 44920100, 71219758, 83254982).  Inheritance:  Pathogenic variants in the TMEM127 gene are inherited in an autosomal dominant manner. This means that an individual with a pathogenic variant has a 50% chance of passing the condition on to their offspring. With this result, it is now possible to identify at-risk relatives who can pursue testing for this specific familial variant. Many cases are inherited from a parent, but some cases can occur spontaneously. This means that an individual with a pathogenic variant has parents who do not have it. However, that individual now has a 50% risk of passing it on to their offspring.  Management:  It is suggested that individuals with hereditary paraganglioma-pheochromocytoma syndrome, along with their at-risk relatives, have regular clinical monitoring by a physician or medical team with expertise in the treatment of hereditary PGL/PCC syndromes. A consultation with an endocrine surgeon, endocrinologist, and otolaryngologist is also recommended to establish an individualized care plan (PMID: 64158309).  Screening typically begins between six and eight years of age. Although there is currently no clear consensus regarding a screening and surveillance protocol for individuals with pathogenic TMEM127 variants, lifelong annual biochemical and clinical surveillance has been suggested (PMID: 40768088, 11031594, 58592924, 46286381):   Physical exam and  blood pressure at the time of diagnosis and then every 6-12 months   24-hour urinary excretion of fractionated metanephrines and catecholamines and/or plasma fractionated metanephrines at least annually to detect metastatic disease, tumor recurrence, or development of additional tumors  Follow up with imaging by CT, MRI, 123I-MIBG (metaiodobenzylguanidine) scintigraphy, or FDG-PET if the fractionated metanephrine and/or catecholamine levels become elevated, or if the original tumor had minimal or no catecholamine/fractionated metanephrine excess  Periodic imaging inclusive of head, neck, thorax, abdomen, and pelvic areas (once every 2 years)  In order to minimize radiation exposure, MRI may be the preferable imaging modality with CT and nuclear imaging reserved to further characterize detected tumors  Imaging modalities should be at the discretion of the managing provider due to conflicting data regarding the utility and efficacy of the various options (PMID: 77116579)  IMPORTANTLY - Mr. Tramell has a metal heart stent and has been told to not have MRIs. Alternative imaging methodologies should be considered, as appropriate.  Evaluation of cases with extra-adrenal sympathetic PGL and [place] for blood pressure elevations, tachycardia, and other signs and symptoms of catecholamine hypersecretion  Medical genetics consultation  It is is important to note that individuals with hereditary PGL/PCC syndromes may be at a greater risk of developing PGL and/or [place] when living in higher altitudes or chronically exposed to hypoxic conditions (PMID: 03833383). There is data suggesting  such risks in individuals with pathogenic variants in SDHA, SDHB, SDHC, SDHD, and SDHAF2; however, it is unclear if this applies specifically to individuals with pathogenic TMEM127 variants (PMID: 61518343).  An individual's cancer risk and medical management are not determined by genetic test results alone. Overall cancer risk  assessment incorporates additional factors including personal medical history, family history as well as available genetic information that may result in a personalized plan for cancer prevention and surveillance.  Knowing if a pathogenic variant in TMEM127 is present is advantageous. At-risk relatives can be identified, allowing pursuit of a diagnostic evaluation. In addition, the available information regarding hereditary cancer susceptibility genes is constantly evolving and more clinically relevant TMEM127 data is likely to become available in the near future. Awareness of this cancer predisposition allows patients and their providers to be vigilant in maintaining close and regular contact with their local genetics clinic in anticipation of new information, inform at-risk family members, and diligently follow condition-specific screening protocols.  FAMILY MEMBERS:  It is important that all of [name]'s relatives (both men and women) know of the presence of this gene mutation. Site-specific genetic testing can sort out who in the family is at risk and who is not.    Mr. Bracco children and siblings have a 50% chance to have inherited this mutation. We recommend they have genetic testing for this same mutation, as identifying the presence of this mutation would allow them to also take advantage of risk-reducing measures.   PLAN: Mr. 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  Mr. Caradine does not have an endocrinologist to perform follow up for hereditary paraganglioma-pheochromocytoma syndrome. We have suggested that he make an appointment with Dr. Delrae Rend. Mr. Brueggemann declined a referral to Dr. Cindra Eves office at this time, and would like to speak with his oncologist, Dr. Delton Hanson, about being followed for Hereditary Paraganglioma-Pheochromocytoma syndrome.  SUPPORT RESOURCES:  The [employer]he[employer] at [email or web address]-para-[email or web address] is a  TEFL teacher for both physicians and patients dealing with neuroendocrine diseases.  In addition, [employer]he[employer] at [email or web address] is a patient run group for individuals living with pheochromocytoma and paraganglioma.     We encouraged Mr. Theiler to remain in contact with [place] on an annual basis so we can update his personal and family histories, and let him know of advances in cancer genetics that may benefit the family. Our contact number was provided. Mr. Ellner questions were answered to his satisfaction today, and he knows he is welcome to call anytime with additional questions.   Clint Guy, MS, [facility] Licensed, Certified Genetic Counselor Email:  [email or web address] Phone:  [phone]  The patient was seen for a total of 60 minutes in face-to-face genetic counseling.

## 2020-01-27 ENCOUNTER — Telehealth: Payer: Self-pay

## 2020-01-27 NOTE — Telephone Encounter (Signed)
Pt called wanting to see about getting blood drawn before appt on Friday. I asked if he had any done recently and pt stated no. I asked Gibson Ramp, RN and she found in chart where pt just had blood draw. Told PSA in chart. Pt expressed understanding.

## 2020-01-29 NOTE — Progress Notes (Signed)
Subjective:  1. Prostate cancer ([place])   2. Rising PSA following treatment for malignant neoplasm of prostate   3. Nocturia   4. Urge incontinence   5. Overactive bladder      I have prostate cancer. HPI: [name] is a 74 year-old male established patient who is here evaluation for treatment of prostate cancer.  His prostate cancer was diagnosed approximately 12/03/2011.   He has undergone External Beam Radiation Therapy for treatment. He has undergone Hormonal Therapy for treatment and last had Lupron 45mg  IM on 07/18/19.  He is due for the injection today.    He does have urinary incontinence. He does have problems with erectile dysfunction. He has not recently had unwanted weight loss. He is not having pain in new locations.   01/30/20: [name] returns in f/u for his locally recurrent prostate cancer now on ADT. His PSA was up to 1.3 on ADT with a testosterone of <10 in 12/20 then 3.2 in 4/21  And 4.2 with a T of <3 in 7/21.   He remains on Lupron and had Xtandi added by Dr. Delton Coombes.  His PSA has fallen to 0.81 with the addition.[name]   He continues to have nocturia q1.5hrs.   He has bilateral ankle edema.  He has UUI and wears pads.  He is off of the tamsulosin and would like to consider going back on something because of increased frequency, urgency and nocturia q18min.  He had some benefit from [facility] but had constipation.  It didn't help on a second trial.  His weight is stable. He has had no bone pain but he has knee pain and has difficulty ambulating.  He is going to see Dr. Maureen Ralphs.   A PET prior to initiation of the firmagon for a rising PSA that increased to 4.0 with a <3 month PSADT showed local uptake on the PET in the prostate with no mets. He had some pulmonary nodules and a CHest was obtained and 3-6 mo f/u imaging was recommended. The CT Chest was done on 08/12/18 and f/u in 6-12 months was recommended. He is doing well without hot flashes or other complaints. He  continues to have nocturia q1.5hrs which may be somewhat better since his PCP added tamsulosin. He has some daytime frequency q2-3hrs. He has intermittency and a sensation of incomplete emptying. He has a variable stream. His IPSS is 21.  He has CKD of 2.16 on recent labs.  His UA had 3-10 RBC's on 4/1.   A renal US in 8/20 didn't show any stones.  He has cysts and some atrophy.   He has a history of T3 N1M0 Gleason 8 prostate cancer with metastatic adenopathy in the right iliac chain. He was diagnosed on 09/26/11. He completed radiation therapy in late October 2015. He has been off of the ADT since 3/18 and his PSA has been rising and is up to 4.0 from 1.6 in 9/19 and from 0.81 on 09/24/17 with a normalized testosterone.   He had cystoscopy and ureteroscopy in June 2019 for gross hematuria from the left collecting system. He has no associated signs or symptoms.     IPSS    Row Name 01/30/20 1500         International Prostate Symptom Score   How often have you had the sensation of not emptying your bladder? Almost always     How often have you had to urinate less than every two hours? More than half the time  How often have you found you stopped and started again several times when you urinated? Less than 1 in 5 times     How often have you found it difficult to postpone urination? More than half the time     How often have you had a weak urinary stream? More than half the time     How often have you had to strain to start urination? Not at All     How many times did you typically get up at night to urinate? 5 Times     Total IPSS Score 23       Quality of Life due to urinary symptoms   If you were to spend the rest of your life with your urinary condition just the way it is now how would you feel about that? Mostly Disatisfied             ROS:  ROS:  A complete review of systems was performed.  All systems are negative except for pertinent findings as noted.   ROS  Allergies   Allergen Reactions  . Carbidopa-Levodopa Other (See Comments)    hallunications  . Morphine Sulfate Nausea And Vomiting  . Sulfa Antibiotics Nausea And Vomiting  . Sulfacetamide Sodium Nausea And Vomiting    Outpatient Encounter Medications as of 01/30/2020  Medication Sig  . allopurinol (ZYLOPRIM) 300 MG tablet Take 1 tablet (300 mg total) by mouth daily.  [name] amLODipine (NORVASC) 10 MG tablet Take 1 tablet (10 mg total) by mouth daily. TAKE 1 TABLET BY MOUTH ONCE DAILY (NEEDS  TO  BE  SEEN  BEFORE  NEXT  REFILL)  . carvedilol (COREG) 25 MG tablet Take 1 tablet (25 mg total) by mouth 2 (two) times daily.  . DULoxetine (CYMBALTA) 60 MG capsule Take 1 capsule by mouth once daily  . enzalutamide (XTANDI) 40 MG tablet Take 4 tablets (160 mg total) by mouth daily.  [name] losartan (COZAAR) 50 MG tablet Take 1 tablet (50 mg total) by mouth daily.  . pantoprazole (PROTONIX) 40 MG tablet Take 40 mg by mouth 2 (two) times daily.  . pregabalin (LYRICA) 25 MG capsule Take 1 capsule (25 mg total) by mouth 2 (two) times daily.  . traMADol (ULTRAM) 50 MG tablet Take 1 tablet (50 mg total) by mouth daily as needed.  . [DISCONTINUED] mirabegron ER (MYRBETRIQ) 25 MG TB24 tablet Take 1 tablet (25 mg total) by mouth daily.  . rosuvastatin (CRESTOR) 20 MG tablet Take 1 tablet (20 mg total) by mouth daily.  . tamsulosin (FLOMAX) 0.4 MG CAPS capsule Take 1 capsule (0.4 mg total) by mouth daily.  . Vibegron (GEMTESA) 75 MG TABS Take 75 mg by mouth daily.  . [EXPIRED] Leuprolide Acetate (6 Month) (LUPRON) injection 45 mg    No facility-administered encounter medications on file as of 01/30/2020.    Past Medical History:  Diagnosis Date  . Aortic atherosclerosis ([place])    2004  s/p  closure Penetrating atherosclerotic ulcer of infarenal aorta w/ endovascular stent graft  . Arthritis   . CKD (chronic kidney disease), stage III ([place])   . Closed fracture of head of humerus 07/2017   right shoulder from fall;  09-25-2017 per pt no surgical intervention, only wore sling, intermittant pain  . Coronary atherosclerosis of native coronary artery    positive myoview for ischemia 09-27-2009;  10-01-2009 per cardiac cath-- minimal nonobstructive CAD w/ 30% LAD   . ED (erectile dysfunction)   . Emphysema/COPD ([place])  followed by pcp--- last exacerbation 09-12-2017;  09-25-2017 per pt no cough, sob or congestion  . Essential hypertension   . Family history of kidney cancer   . First degree heart block   . Frequency of urination   . GERD (gastroesophageal reflux disease)   . Gout    09-25-2017  per pt stable,  last episode 2015  . Heart murmur   . History of adenomatous polyp of colon   . History of closed head injury 2005   per pt residual resolved  . History of external beam radiation therapy    completed 10/ 2015 for prostate cancer  . History of gastric ulcer 2014  . History of kidney stones   . History of pneumothorax    spontaneous pneumo treated w/ chest tube  . History of rib fracture 07/2017   from fall,  right side 6th,7th,8th  . Neuropathy   . OAB (overactive bladder)   . OSA (obstructive sleep apnea)    Intolerant of CPAP  . Prostate cancer ([place])    dx 09-26-2011 via bx-- Stage T3b,N0,  Gleason 4+4, PSA 11.2 with METs to external iliac lymph node(resolved with ADT)-- treated w/ ADT ;   05/2013 staging work-up for rising PSA , started casodex and completed radiation therapy 10/ 2015;   rising PSA post treatment  . RLS (restless legs syndrome)   . Ureteral neocystostomy bleed    left side  . Urge urinary incontinence     Past Surgical History:  Procedure Laterality Date  . ABDOMINAL AORTIC ENDOVASCULAR STENT GRAFT  09-13-2007    dr [name]  [facility]   closure penetrating atherosclerotic ulcer of infarenal aorta with stent graft  . BIOPSY  03/06/2019   Procedure: BIOPSY;  Surgeon: [name], MD;  [facility];  Service: Endoscopy;;  gastric  . CARDIAC CATHETERIZATION   2005   per pt normal (done in [place])  . CARDIAC CATHETERIZATION  10/06/2009    dr cooper   minimal nonobstructive CAD w/30% LAD otherwise normal coronaries, LVEDP 42mmHg  . CARDIOVASCULAR STRESS TEST  09/27/2009    dr Aundra Dubin   lexiscan nuclear study w/ moderate reversible inferior perfusion defect ischemia,  ef 60% (cardiac cath scheduled)  . CHEST TUBE INSERTION  1989   "collapsed lung"due to injury  . CIRCUMCISION  09/26/2011   Procedure: CIRCUMCISION ADULT;  Surgeon: [name], MD;  Location: [facility];  Service: Urology;  Laterality: N/A;  . COLONOSCOPY W/ POLYPECTOMY    . COLONOSCOPY WITH PROPOFOL N/A 04/27/2016    eight 4-8 mm polyps in descending colon, at splenic flexure, in ascending colon and cecum. Tubular adenomas. Surveillance in 3 years.  . CYSTOSCOPY  09/26/2011   Procedure: CYSTOSCOPY;  Surgeon: [name], MD;  Location: [facility];  Service: Urology;  Laterality: N/A;  . CYSTOSCOPY/RETROGRADE/URETEROSCOPY Left 09/27/2017   Procedure: CYSTOSCOPYLEFT Marlow Baars AND RENAL WASHINGS;  Surgeon: [name], MD;  Location: [facility];  Service: Urology;  Laterality: Left;  . ESOPHAGOGASTRODUODENOSCOPY (EGD) WITH PROPOFOL N/A 04/27/2016   normal esophagus s/p dilation, small hiatal hernia, normal duodenum  . ESOPHAGOGASTRODUODENOSCOPY (EGD) WITH PROPOFOL N/A 03/06/2019   erosive reflux esophagitis, s/p dilation, normal duodenum, abnormal gastric mucosa s/p biopsy. Hyperplastic gastric polyp. No H.pylori.   [name] EXTRACORPOREAL SHOCK WAVE LITHOTRIPSY  yrs ago  . FRACTURE SURGERY  child   Bilateral lower arms   . KNEE ARTHROSCOPY Right 2013  . MALONEY DILATION N/A 04/27/2016   Procedure: MALONEY DILATION;  Surgeon: [name], MD;  [facility];  Service: Endoscopy;  Laterality: N/A;  . Venia Minks DILATION N/A 03/06/2019   Procedure: Venia Minks DILATION;  Surgeon: [name], MD;  [facility];  Service: Endoscopy;  Laterality: N/A;   . PARTIAL KNEE ARTHROPLASTY Right 04/26/2015   Procedure: RIGHT KNEE MEDIAL UNICOMPARTMENTAL ARTHROPLASTY;  Surgeon: Gaynelle Arabian, MD;  Location: [facility];  Service: Orthopedics;  Laterality: Right;  . POLYPECTOMY  04/27/2016   Procedure: POLYPECTOMY;  Surgeon: [name], MD;  [facility];  Service: Endoscopy;;  cecal , ascending, descending, and sigmoid polypectomies  . PROSTATE BIOPSY  09/26/2011   Procedure: BIOPSY TRANSRECTAL ULTRASONIC PROSTATE (TUBP);  Surgeon: [name], MD;  Location: [facility];  Service: Urology;  Laterality: N/A;     . TRANSTHORACIC ECHOCARDIOGRAM  01-07-2013   dr [name]   ef 60-65%,  grade 1 diastolic dysfunction/  mild AR without stenosis/  mild LAE/  mild to moderate calcificed MV annulus without regurg. or stenosis/  . UMBILICAL HERNIA REPAIR  yrs ago    Social History   Socioeconomic History  . Marital status: Married    Spouse name: Not on file  . Number of children: 3  . Years of education: 31  . Highest education level: Some college, no degree  Occupational History  . Occupation: Retired    Comment: Hotel manager    Comment: Part Time at [employer]  . Smoking status: Current Every Day Smoker    Packs/day: 0.50    Years: 42.00    Pack years: 21.00    Types: Cigarettes  . Smokeless tobacco: Never Used  Vaping Use  . Vaping Use: Never used  Substance and Sexual Activity  . Alcohol use: No    Alcohol/week: 0.0 standard drinks  . Drug use: No  . Sexual activity: Yes    Birth control/protection: None  Other Topics Concern  . Not on file  Social History Narrative   Patient is retired from the department of [employer]. He lives in a one story home with his wife. He moved from [place] [facility] about 9 years ago. They do not have family on the [place] and although he is very outgoing he feels socially isolated. He isn't a member of any groups or churches.    Social Determinants of Health   Financial Resource Strain:  High Risk  . Difficulty of Paying Living Expenses: Hard  Food Insecurity:   . Worried About Charity fundraiser in the Last Year: Not on file  . Ran Out of Food in the Last Year: Not on file  Transportation Needs: No Transportation Needs  . Lack of Transportation (Medical): No  . Lack of Transportation (Non-Medical): No  Physical Activity:   . Days of Exercise per Week: Not on file  . Minutes of Exercise per Session: Not on file  Stress:   . Feeling of Stress : Not on file  Social Connections:   . Frequency of Communication with Friends and Family: Not on file  . Frequency of Social Gatherings with Friends and Family: Not on file  . Attends Religious Services: Not on file  . Active Member of Clubs or Organizations: Not on file  . Attends Archivist Meetings: Not on file  . Marital Status: Not on file  Intimate Partner Violence: Not At Risk  . Fear of Current or Ex-Partner: No  . Emotionally Abused: No  . Physically Abused:  No  . Sexually Abused: No    Family History  Problem Relation Age of Onset  . Stroke Mother   . Alzheimer's disease Mother 36       probably due to head injury  . Mesothelioma Father 85  . Kidney cancer Father   . Hyperlipidemia Sister   . Heart attack Sister   . Kidney cancer Sister        dx in her 70s  . Heart disease Brother   . Kidney cancer Brother 47       dx in his 70s  . Hyperlipidemia Brother   . Kidney cancer Brother        dx in his 62s  . Healthy Daughter   . Healthy Son   . Healthy Son   . Colon cancer Neg Hx        Objective: Vitals:   01/30/20 1446  BP: (!) 151/95  Pulse: 73  Temp: 98.4 F (36.9 C)     Physical Exam  Lab Results:  Recent Results (from the past 2160 hour(s))  PSA     Status: Abnormal   Collection Time: 11/14/19 12:13 PM  Result Value Ref Range   Prostate Specific Ag, Serum 4.2 (H) 0.0 - 4.0 ng/mL    Comment: Roche ECLIA methodology. According to the American Urological Association,  Serum PSA should decrease and remain at undetectable levels after radical prostatectomy. The AUA defines biochemical recurrence as an initial PSA value 0.2 ng/mL or greater followed by a subsequent confirmatory PSA value 0.2 ng/mL or greater. Values obtained with different assay methods or kits cannot be used interchangeably. Results cannot be interpreted as absolute evidence of the presence or absence of malignant disease.   Testosterone     Status: Abnormal   Collection Time: 11/14/19 12:13 PM  Result Value Ref Range   Testosterone <3 (L) 264 - 916 ng/dL    Comment: Adult male reference interval is based on a population of healthy nonobese males (BMI <30) between 61 and 40 years old. [place], [place] [phone]. PMID: [id number].   Urinalysis, Routine w reflex microscopic     Status: Abnormal   Collection Time: 11/21/19  1:09 PM  Result Value Ref Range   Specific Gravity, UA 1.020 1.005 - 1.030   pH, UA 6.0 5.0 - 7.5   Color, UA Yellow Yellow   Appearance Ur Clear Clear   Leukocytes,UA Negative Negative   Protein,UA 3+ (A) Negative/Trace   Glucose, UA Negative Negative   Ketones, UA Negative Negative   RBC, UA 1+ (A) Negative   Bilirubin, UA Negative Negative   Urobilinogen, Ur 0.2 0.2 - 1.0 mg/dL   Nitrite, UA Negative Negative   Microscopic Examination See below:   Microscopic Examination     Status: Abnormal   Collection Time: 11/21/19  1:09 PM   Urine  Result Value Ref Range   WBC, UA None seen 0 - 5 /hpf   RBC 11-30 (A) 0 - 2 /hpf   Epithelial Cells (non renal) 0-10 0 - 10 /hpf   Renal Epithel, UA None seen None seen /hpf   Mucus, UA Present Not Estab.   Bacteria, UA None seen None seen/Few  CBC with Differential/Platelet     Status: Abnormal   Collection Time: 01/19/20 10:48 AM  Result Value Ref Range   WBC 6.6 4.0 - 10.5 K/uL   RBC 4.00 (L) 4.22 - 5.81 MIL/uL   Hemoglobin 12.0 (L) 13.0 - 17.0 g/dL   HCT 36.8 (  L) 39 - 52 %   MCV 92.0 80.0 - 100.0  fL   MCH 30.0 26.0 - 34.0 pg   MCHC 32.6 30.0 - 36.0 g/dL   RDW 15.1 11.5 - 15.5 %   Platelets 202 150 - 400 K/uL   nRBC 0.0 0.0 - 0.2 %   Neutrophils Relative % 78 %   Neutro Abs 5.2 1.7 - 7.7 K/uL   Lymphocytes Relative 9 %   Lymphs Abs 0.6 (L) 0.7 - 4.0 K/uL   Monocytes Relative 5 %   Monocytes Absolute 0.3 0.1 - 1.0 K/uL   Eosinophils Relative 6 %   Eosinophils Absolute 0.4 0.0 - 0.5 K/uL   Basophils Relative 1 %   Basophils Absolute 0.0 0.0 - 0.1 K/uL   Immature Granulocytes 1 %   Abs Immature Granulocytes 0.03 0.00 - 0.07 K/uL    Comment: Performed at [facility], [address] [place], [place] [address]  Comprehensive metabolic panel     Status: Abnormal   Collection Time: 01/19/20 10:48 AM  Result Value Ref Range   Sodium 141 135 - 145 mmol/L   Potassium 3.7 3.5 - 5.1 mmol/L   Chloride 104 98 - 111 mmol/L   CO2 24 22 - 32 mmol/L   Glucose, Bld 112 (H) 70 - 99 mg/dL    Comment: Glucose reference range applies only to samples taken after fasting for at least 8 hours.   BUN 29 (H) 8 - 23 mg/dL   Creatinine, Ser 2.58 (H) 0.61 - 1.24 mg/dL   Calcium 8.5 (L) 8.9 - 10.3 mg/dL   Total Protein 7.4 6.5 - 8.1 g/dL   Albumin 3.9 3.5 - 5.0 g/dL   AST 17 15 - 41 U/L   ALT 14 0 - 44 U/L   Alkaline Phosphatase 66 38 - 126 U/L   Total Bilirubin 0.7 0.3 - 1.2 mg/dL   GFR, Estimated 23 (L) >60 mL/min   Anion gap 13 5 - 15    Comment: Performed at [facility], [address], [place], [place] [address]  PSA     Status: None   Collection Time: 01/19/20 10:48 AM  Result Value Ref Range   Prostatic Specific Antigen 0.81 0.00 - 4.00 ng/mL    Comment: (NOTE) While PSA levels of <=4.0 ng/ml are reported as reference range, some men with levels below 4.0 ng/ml can have prostate cancer and many men with PSA above 4.0 ng/ml do not have prostate cancer.  Other tests such as free PSA, age specific reference ranges, PSA velocity and PSA doubling time may be helpful especially in men less  than 55 years old. Performed at [facility], [place] [address], [place], [place] [address]        Studies/Results: Axumin PET was done on 12/18/19.  IMPRESSION: 1. Persistent hypermetabolism involving the prostate in the setting of prior radiation therapy. Slightly increased on the left today, highly suspicious for residual disease. 2. Similar left and slight increase in right inguinal nodal low-level hypermetabolism, favored to be reactive but warranting follow-up attention. 3. Increased hypermetabolism within enlarging thoracic nodes. Cannot exclude an atypical distribution of nodal metastasis. This warrants follow-up attention with either PET or chest CT at 6 months. 4. Incidental findings, including: Aortic atherosclerosis (ICD10-I70.0), coronary artery atherosclerosis and emphysema (ICD10-J43.9). Right femoral head avascular necrosis.  Assessment & Plan: Prostate cancer with possible thoracic adenopathy and a rising PSA on ADT now down with the addition of Xtandi which he is taking  3x daily.  Nocturia and Urge incontinence.   I will give him Gemtesa samples and he will resume tamsulosin.      Meds ordered this encounter  Medications  . tamsulosin (FLOMAX) 0.4 MG CAPS capsule    Sig: Take 1 capsule (0.4 mg total) by mouth daily.    Dispense:  30 capsule    Refill:  1  . Vibegron (GEMTESA) 75 MG TABS    Sig: Take 75 mg by mouth daily.    Dispense:  28 tablet    Refill:  0    Samples given  . Leuprolide Acetate (6 Month) (LUPRON) injection 45 mg     Orders Placed This Encounter  Procedures  . PSA    Standing Status:   Future    Standing Expiration Date:   01/29/2021  . Testosterone    Standing Status:   Future    Standing Expiration Date:   01/29/2021      Return in about 3 months (around 05/01/2020).   CC: [name], [name], MD      [name] 01/30/2020

## 2020-01-30 ENCOUNTER — Ambulatory Visit (INDEPENDENT_AMBULATORY_CARE_PROVIDER_SITE_OTHER): Payer: Medicare Other | Admitting: Urology

## 2020-01-30 ENCOUNTER — Encounter: Payer: Self-pay | Admitting: Urology

## 2020-01-30 ENCOUNTER — Other Ambulatory Visit: Payer: Self-pay

## 2020-01-30 VITALS — BP 151/95 | HR 73 | Temp 98.4°F | Ht 68.0 in | Wt 289.0 lb

## 2020-01-30 DIAGNOSIS — N3941 Urge incontinence: Secondary | ICD-10-CM

## 2020-01-30 DIAGNOSIS — I25119 Atherosclerotic heart disease of native coronary artery with unspecified angina pectoris: Secondary | ICD-10-CM

## 2020-01-30 DIAGNOSIS — C61 Malignant neoplasm of prostate: Secondary | ICD-10-CM

## 2020-01-30 DIAGNOSIS — N3281 Overactive bladder: Secondary | ICD-10-CM

## 2020-01-30 DIAGNOSIS — R351 Nocturia: Secondary | ICD-10-CM

## 2020-01-30 DIAGNOSIS — R9721 Rising PSA following treatment for malignant neoplasm of prostate: Secondary | ICD-10-CM

## 2020-01-30 MED ORDER — LEUPROLIDE ACETATE (6 MONTH) 45 MG IM KIT
45.0000 mg | PACK | Freq: Once | INTRAMUSCULAR | Status: AC
  Administered 2020-01-30: 45 mg via INTRAMUSCULAR

## 2020-01-30 MED ORDER — GEMTESA 75 MG PO TABS: 75.0000 mg | ORAL_TABLET | Freq: Every day | ORAL | 0 refills | Status: DC

## 2020-01-30 MED ORDER — TAMSULOSIN HCL 0.4 MG PO CAPS: 0.4000 mg | ORAL_CAPSULE | Freq: Every day | ORAL | 1 refills | Status: DC

## 2020-01-30 NOTE — Progress Notes (Signed)
Lupron IM Injection   Due to Prostate Cancer patient is present today for a Lupron Injection.  Medication: Lupron 6 month Dose: 45 mg  Location: left upper outer buttocks Lot: [id number] Exp: 06/06/22  Patient tolerated well, no complications were noted  Performed by: Jorge Ny

## 2020-01-30 NOTE — Progress Notes (Signed)
Urological Symptom Review  Patient is experiencing the following symptoms: Frequent urination Hard to postpone urination Get up at night to urinate Leakage of urine   Review of Systems  Gastrointestinal (upper)  : Negative for upper GI symptoms  Gastrointestinal (lower) : Negative for lower GI symptoms  Constitutional : Negative for symptoms  Skin: Negative for skin symptoms  Eyes: Negative for eye symptoms  Ear/Nose/Throat : Negative for Ear/Nose/Throat symptoms  Hematologic/Lymphatic: Negative for Hematologic/Lymphatic symptoms  Cardiovascular : Leg swelling  Respiratory : Negative for respiratory symptoms  Endocrine: Negative for endocrine symptoms  Musculoskeletal: Back pain  Neurological: Negative for neurological symptoms  Psychologic: Negative for psychiatric symptoms

## 2020-02-07 ENCOUNTER — Other Ambulatory Visit: Payer: Self-pay | Admitting: Family Medicine

## 2020-02-07 DIAGNOSIS — I10 Essential (primary) hypertension: Secondary | ICD-10-CM

## 2020-02-11 ENCOUNTER — Inpatient Hospital Stay (HOSPITAL_COMMUNITY): Payer: Medicare Other | Attending: Hematology

## 2020-02-11 ENCOUNTER — Other Ambulatory Visit: Payer: Self-pay

## 2020-02-11 DIAGNOSIS — G629 Polyneuropathy, unspecified: Secondary | ICD-10-CM | POA: Insufficient documentation

## 2020-02-11 DIAGNOSIS — Z79899 Other long term (current) drug therapy: Secondary | ICD-10-CM | POA: Insufficient documentation

## 2020-02-11 DIAGNOSIS — C61 Malignant neoplasm of prostate: Secondary | ICD-10-CM | POA: Diagnosis not present

## 2020-02-11 DIAGNOSIS — Z923 Personal history of irradiation: Secondary | ICD-10-CM | POA: Diagnosis not present

## 2020-02-11 DIAGNOSIS — N3281 Overactive bladder: Secondary | ICD-10-CM | POA: Diagnosis not present

## 2020-02-11 DIAGNOSIS — F17210 Nicotine dependence, cigarettes, uncomplicated: Secondary | ICD-10-CM | POA: Diagnosis not present

## 2020-02-11 LAB — COMPREHENSIVE METABOLIC PANEL
ALT: 12 U/L (ref 0–44)
AST: 13 U/L — ABNORMAL LOW (ref 15–41)
Albumin: 3.7 g/dL (ref 3.5–5.0)
Alkaline Phosphatase: 64 U/L (ref 38–126)
Anion gap: 9 (ref 5–15)
BUN: 23 mg/dL (ref 8–23)
CO2: 23 mmol/L (ref 22–32)
Calcium: 8.2 mg/dL — ABNORMAL LOW (ref 8.9–10.3)
Chloride: 105 mmol/L (ref 98–111)
Creatinine, Ser: 2.22 mg/dL — ABNORMAL HIGH (ref 0.61–1.24)
GFR, Estimated: 30 mL/min — ABNORMAL LOW (ref >60)
Glucose, Bld: 110 mg/dL — ABNORMAL HIGH (ref 70–99)
Potassium: 3.5 mmol/L (ref 3.5–5.1)
Sodium: 137 mmol/L (ref 135–145)
Total Bilirubin: 0.6 mg/dL (ref 0.3–1.2)
Total Protein: 7.1 g/dL (ref 6.5–8.1)

## 2020-02-11 LAB — CBC WITH DIFFERENTIAL/PLATELET
Abs Immature Granulocytes: 0.02 10*3/uL (ref 0.00–0.07)
Basophils Absolute: 0 10*3/uL (ref 0.0–0.1)
Basophils Relative: 1 %
Eosinophils Absolute: 0.3 10*3/uL (ref 0.0–0.5)
Eosinophils Relative: 5 %
HCT: 35.1 % — ABNORMAL LOW (ref 39.0–52.0)
Hemoglobin: 11.3 g/dL — ABNORMAL LOW (ref 13.0–17.0)
Immature Granulocytes: 0 %
Lymphocytes Relative: 12 %
Lymphs Abs: 0.8 10*3/uL (ref 0.7–4.0)
MCH: 29.7 pg (ref 26.0–34.0)
MCHC: 32.2 g/dL (ref 30.0–36.0)
MCV: 92.1 fL (ref 80.0–100.0)
Monocytes Absolute: 0.4 10*3/uL (ref 0.1–1.0)
Monocytes Relative: 6 %
Neutro Abs: 4.8 10*3/uL (ref 1.7–7.7)
Neutrophils Relative %: 76 %
Platelets: 208 10*3/uL (ref 150–400)
RBC: 3.81 MIL/uL — ABNORMAL LOW (ref 4.22–5.81)
RDW: 15 % (ref 11.5–15.5)
WBC: 6.3 10*3/uL (ref 4.0–10.5)
nRBC: 0 % (ref 0.0–0.2)

## 2020-02-12 ENCOUNTER — Inpatient Hospital Stay (HOSPITAL_BASED_OUTPATIENT_CLINIC_OR_DEPARTMENT_OTHER): Payer: Medicare Other | Admitting: Hematology

## 2020-02-12 ENCOUNTER — Inpatient Hospital Stay (HOSPITAL_COMMUNITY): Payer: Medicare Other

## 2020-02-12 VITALS — BP 168/84 | HR 70 | Temp 97.0°F | Resp 20 | Wt 286.4 lb

## 2020-02-12 DIAGNOSIS — Z79899 Other long term (current) drug therapy: Secondary | ICD-10-CM | POA: Diagnosis not present

## 2020-02-12 DIAGNOSIS — F17210 Nicotine dependence, cigarettes, uncomplicated: Secondary | ICD-10-CM | POA: Diagnosis not present

## 2020-02-12 DIAGNOSIS — Z923 Personal history of irradiation: Secondary | ICD-10-CM | POA: Diagnosis not present

## 2020-02-12 DIAGNOSIS — G629 Polyneuropathy, unspecified: Secondary | ICD-10-CM | POA: Diagnosis not present

## 2020-02-12 DIAGNOSIS — C61 Malignant neoplasm of prostate: Secondary | ICD-10-CM

## 2020-02-12 DIAGNOSIS — N3281 Overactive bladder: Secondary | ICD-10-CM | POA: Diagnosis not present

## 2020-02-12 MED ORDER — ENZALUTAMIDE 40 MG PO TABS: 120.0000 mg | ORAL_TABLET | Freq: Every day | ORAL | 3 refills | Status: DC

## 2020-02-12 NOTE — Progress Notes (Signed)
[place] [place], [place] [address]   CLINIC:  Medical Oncology/Hematology  PCP:  [name], [name], MD [place] / [place] [address] [phone]   REASON FOR VISIT:  Follow-up for recurrent prostate cancer  PRIOR THERAPY: External beam radiation therapy with bicalutamide  NGS Results: Not done  CURRENT THERAPY: Enzalutamide 120 mg daily  BRIEF ONCOLOGIC HISTORY:  Oncology History   No history exists.    CANCER STAGING: Cancer Staging Prostate cancer [facility]) Staging form: Prostate, AJCC 7th Edition - Clinical: Stage IV (pT3b, N1, M0) - Signed by Wyatt Portela, MD on 07/09/2013   INTERVAL HISTORY:  [name], a 74 y.o. male, returns for routine follow-up of his recurrent prostate cancer. Lori was last seen on 01/19/2020.   Today he reports feeling okay. He enjoyed his trip to [place], [place]. He is taking enzalutamide 3 tablets and is tolerating it well; he denies having more fatigue, N/V/D, though he fell 2 days ago while walking at home. He reports that none of the neurologists can determine the cause of his falling. He saw Dr. [name] on 10/29 and got Gemtesa samples which slightly helped with his urination. His legs are swollen daily and stable.   REVIEW OF SYSTEMS:  Review of Systems  Constitutional: Positive for fatigue (depleted). Negative for appetite change.  Cardiovascular: Positive for leg swelling (feet swelling stable).  Gastrointestinal: Negative for diarrhea, nausea and vomiting.  All other systems reviewed and are negative.   PAST MEDICAL/SURGICAL HISTORY:  Past Medical History:  Diagnosis Date  . Aortic atherosclerosis ([place])    2004  s/p  closure Penetrating atherosclerotic ulcer of infarenal aorta w/ endovascular stent graft  . Arthritis   . CKD (chronic kidney disease), stage III ([place])   . Closed fracture of head of humerus 07/2017   right shoulder from fall; 09-25-2017 per pt no surgical  intervention, only wore sling, intermittant pain  . Coronary atherosclerosis of native coronary artery    positive myoview for ischemia 09-27-2009;  10-01-2009 per cardiac cath-- minimal nonobstructive CAD w/ 30% LAD   . ED (erectile dysfunction)   . Emphysema/COPD [facility])    followed by pcp--- last exacerbation 09-12-2017;  09-25-2017 per pt no cough, sob or congestion  . Essential hypertension   . Family history of kidney cancer   . First degree heart block   . Frequency of urination   . GERD (gastroesophageal reflux disease)   . Gout    09-25-2017  per pt stable,  last episode 2015  . Heart murmur   . History of adenomatous polyp of colon   . History of closed head injury 2005   per pt residual resolved  . History of external beam radiation therapy    completed 10/ 2015 for prostate cancer  . History of gastric ulcer 2014  . History of kidney stones   . History of pneumothorax    spontaneous pneumo treated w/ chest tube  . History of rib fracture 07/2017   from fall,  right side 6th,7th,8th  . Neuropathy   . OAB (overactive bladder)   . OSA (obstructive sleep apnea)    Intolerant of CPAP  . Prostate cancer ([place])    dx 09-26-2011 via bx-- Stage T3b,N0,  Gleason 4+4, PSA 11.2 with METs to external iliac lymph node(resolved with ADT)-- treated w/ ADT ;   05/2013 staging work-up for rising PSA , started casodex and completed radiation therapy 10/ 2015;  rising PSA post treatment  . RLS (restless legs syndrome)   . Ureteral neocystostomy bleed    left side  . Urge urinary incontinence    Past Surgical History:  Procedure Laterality Date  . ABDOMINAL AORTIC ENDOVASCULAR STENT GRAFT  09-13-2007    dr [name]  [facility]   closure penetrating atherosclerotic ulcer of infarenal aorta with stent graft  . BIOPSY  03/06/2019   Procedure: BIOPSY;  Surgeon: [name], MD;  [facility];  Service: Endoscopy;;  gastric  . CARDIAC CATHETERIZATION  2005   per pt normal (done in  [place])  . CARDIAC CATHETERIZATION  10/06/2009    dr cooper   minimal nonobstructive CAD w/30% LAD otherwise normal coronaries, LVEDP 76mmHg  . CARDIOVASCULAR STRESS TEST  09/27/2009    dr Aundra Dubin   lexiscan nuclear study w/ moderate reversible inferior perfusion defect ischemia,  ef 60% (cardiac cath scheduled)  . CHEST TUBE INSERTION  1989   "collapsed lung"due to injury  . CIRCUMCISION  09/26/2011   Procedure: CIRCUMCISION ADULT;  Surgeon: [name], MD;  Location: [facility];  Service: Urology;  Laterality: N/A;  . COLONOSCOPY W/ POLYPECTOMY    . COLONOSCOPY WITH PROPOFOL N/A 04/27/2016    eight 4-8 mm polyps in descending colon, at splenic flexure, in ascending colon and cecum. Tubular adenomas. Surveillance in 3 years.  . CYSTOSCOPY  09/26/2011   Procedure: CYSTOSCOPY;  Surgeon: [name], MD;  Location: [facility];  Service: Urology;  Laterality: N/A;  . CYSTOSCOPY/RETROGRADE/URETEROSCOPY Left 09/27/2017   Procedure: CYSTOSCOPYLEFT Marlow Baars AND RENAL WASHINGS;  Surgeon: [name], MD;  Location: [facility];  Service: Urology;  Laterality: Left;  . ESOPHAGOGASTRODUODENOSCOPY (EGD) WITH PROPOFOL N/A 04/27/2016   normal esophagus s/p dilation, small hiatal hernia, normal duodenum  . ESOPHAGOGASTRODUODENOSCOPY (EGD) WITH PROPOFOL N/A 03/06/2019   erosive reflux esophagitis, s/p dilation, normal duodenum, abnormal gastric mucosa s/p biopsy. Hyperplastic gastric polyp. No H.pylori.   [name] EXTRACORPOREAL SHOCK WAVE LITHOTRIPSY  yrs ago  . FRACTURE SURGERY  child   Bilateral lower arms   . KNEE ARTHROSCOPY Right 2013  . MALONEY DILATION N/A 04/27/2016   Procedure: Venia Minks DILATION;  Surgeon: [name], MD;  [facility];  Service: Endoscopy;  Laterality: N/A;  . Venia Minks DILATION N/A 03/06/2019   Procedure: Venia Minks DILATION;  Surgeon: [name], MD;  [facility];  Service: Endoscopy;  Laterality: N/A;  . PARTIAL KNEE ARTHROPLASTY  Right 04/26/2015   Procedure: RIGHT KNEE MEDIAL UNICOMPARTMENTAL ARTHROPLASTY;  Surgeon: Gaynelle Arabian, MD;  [facility];  Service: Orthopedics;  Laterality: Right;  . POLYPECTOMY  04/27/2016   Procedure: POLYPECTOMY;  Surgeon: [name], MD;  [facility];  Service: Endoscopy;;  cecal , ascending, descending, and sigmoid polypectomies  . PROSTATE BIOPSY  09/26/2011   Procedure: BIOPSY TRANSRECTAL ULTRASONIC PROSTATE (TUBP);  Surgeon: [name], MD;  [facility];  Service: Urology;  Laterality: N/A;     . TRANSTHORACIC ECHOCARDIOGRAM  01-07-2013   dr [name]   ef 60-65%,  grade 1 diastolic dysfunction/  mild AR without stenosis/  mild LAE/  mild to moderate calcificed MV annulus without regurg. or stenosis/  . UMBILICAL HERNIA REPAIR  yrs ago    SOCIAL HISTORY:  Social History   Socioeconomic History  . Marital status: Married    Spouse name: Not on file  . Number of children: 3  . Years of education: 26  . Highest  education level: Some college, no degree  Occupational History  . Occupation: Retired    Comment: Hotel manager    Comment: Part Time at [employer]  . Smoking status: Current Every Day Smoker    Packs/day: 0.50    Years: 42.00    Pack years: 21.00    Types: Cigarettes  . Smokeless tobacco: Never Used  Vaping Use  . Vaping Use: Never used  Substance and Sexual Activity  . Alcohol use: No    Alcohol/week: 0.0 standard drinks  . Drug use: No  . Sexual activity: Yes    Birth control/protection: None  Other Topics Concern  . Not on file  Social History Narrative   Patient is retired from the department of [employer]. He lives in a one story home with his wife. He moved from [place] about 9 years ago. They do not have family on the [place] and although he is very outgoing he feels socially isolated. He isn't a member of any groups or churches.    Social Determinants of Health   Financial Resource Strain: High Risk  .  Difficulty of Paying Living Expenses: Hard  Food Insecurity:   . Worried About Charity fundraiser in the Last Year: Not on file  . Ran Out of Food in the Last Year: Not on file  Transportation Needs: No Transportation Needs  . Lack of Transportation (Medical): No  . Lack of Transportation (Non-Medical): No  Physical Activity:   . Days of Exercise per Week: Not on file  . Minutes of Exercise per Session: Not on file  Stress:   . Feeling of Stress : Not on file  Social Connections:   . Frequency of Communication with Friends and Family: Not on file  . Frequency of Social Gatherings with Friends and Family: Not on file  . Attends Religious Services: Not on file  . Active Member of Clubs or Organizations: Not on file  . Attends Archivist Meetings: Not on file  . Marital Status: Not on file  Intimate Partner Violence: Not At Risk  . Fear of Current or Ex-Partner: No  . Emotionally Abused: No  . Physically Abused: No  . Sexually Abused: No    FAMILY HISTORY:  Family History  Problem Relation Age of Onset  . Stroke Mother   . Alzheimer's disease Mother 102       probably due to head injury  . Mesothelioma Father 3  . Kidney cancer Father   . Hyperlipidemia Sister   . Heart attack Sister   . Kidney cancer Sister        dx in her 52s  . Heart disease Brother   . Kidney cancer Brother 56       dx in his 39s  . Hyperlipidemia Brother   . Kidney cancer Brother        dx in his 49s  . Healthy Daughter   . Healthy Son   . Healthy Son   . Colon cancer Neg Hx     CURRENT MEDICATIONS:  Current Outpatient Medications  Medication Sig Dispense Refill  . allopurinol (ZYLOPRIM) 300 MG tablet Take 1 tablet (300 mg total) by mouth daily. 90 tablet 3  . amLODipine (NORVASC) 10 MG tablet Take 1 tablet (10 mg total) by mouth daily. TAKE 1 TABLET BY MOUTH ONCE DAILY (NEEDS  TO  BE  SEEN  BEFORE  NEXT  REFILL) 90 tablet 3  . carvedilol (COREG) 25  MG tablet Take 1 tablet (25 mg  total) by mouth 2 (two) times daily. 180 tablet 3  . DULoxetine (CYMBALTA) 60 MG capsule Take 1 capsule by mouth once daily 30 capsule 2  . enzalutamide (XTANDI) 40 MG tablet Take 3 tablets (120 mg total) by mouth daily. 90 tablet 3  . losartan (COZAAR) 50 MG tablet Take 1 tablet by mouth once daily 90 tablet 0  . pantoprazole (PROTONIX) 40 MG tablet Take 40 mg by mouth 2 (two) times daily.    . pregabalin (LYRICA) 25 MG capsule Take 1 capsule (25 mg total) by mouth 2 (two) times daily. 60 capsule 1  . tamsulosin (FLOMAX) 0.4 MG CAPS capsule Take 1 capsule (0.4 mg total) by mouth daily. 30 capsule 1  . traMADol (ULTRAM) 50 MG tablet Take 1 tablet (50 mg total) by mouth daily as needed. 30 tablet 1  . Vibegron (GEMTESA) 75 MG TABS Take 75 mg by mouth daily. 28 tablet 0  . rosuvastatin (CRESTOR) 20 MG tablet Take 1 tablet (20 mg total) by mouth daily. 90 tablet 3   No current facility-administered medications for this visit.    ALLERGIES:  Allergies  Allergen Reactions  . Carbidopa-Levodopa Other (See Comments)    hallunications  . Morphine Sulfate Nausea And Vomiting  . Sulfa Antibiotics Nausea And Vomiting  . Sulfacetamide Sodium Nausea And Vomiting    PHYSICAL EXAM:  Performance status (ECOG): 1 - Symptomatic but completely ambulatory  Vitals:   02/12/20 1057  BP: (!) 168/84  Pulse: 70  Resp: 20  Temp: (!) 97 F (36.1 C)  SpO2: 97%   Wt Readings from Last 3 Encounters:  02/12/20 286 lb 6.4 oz (129.9 kg)  01/30/20 289 lb (131.1 kg)  01/19/20 288 lb 12.8 oz (131 kg)   Physical Exam Vitals reviewed.  Constitutional:      Appearance: Normal appearance. He is obese.  Cardiovascular:     Rate and Rhythm: Normal rate and regular rhythm.     Pulses: Normal pulses.     Heart sounds: Normal heart sounds.  Pulmonary:     Effort: Pulmonary effort is normal.     Breath sounds: Normal breath sounds.  Neurological:     General: No focal deficit present.     Mental Status: He  is alert and oriented to person, place, and time.  Psychiatric:        Mood and Affect: Mood normal.        Behavior: Behavior normal.      LABORATORY DATA:  I have reviewed the labs as listed.  CBC Latest Ref Rng & Units 02/11/2020 01/19/2020 06/16/2019  WBC 4.0 - 10.5 K/uL 6.3 6.6 6.5  Hemoglobin 13.0 - 17.0 g/dL 11.3(L) 12.0(L) 11.7(L)  Hematocrit 39 - 52 % 35.1(L) 36.8(L) 34.4(L)  Platelets 150 - 400 K/uL 208 202 234   CMP Latest Ref Rng & Units 02/11/2020 01/19/2020 08/13/2019  Glucose 70 - 99 mg/dL 110(H) 112(H) -  BUN 8 - 23 mg/dL 23 29(H) -  Creatinine 0.61 - 1.24 mg/dL 2.22(H) 2.58(H) -  Sodium 135 - 145 mmol/L 137 141 -  Potassium 3.5 - 5.1 mmol/L 3.5 3.7 -  Chloride 98 - 111 mmol/L 105 104 -  CO2 22 - 32 mmol/L 23 24 -  Calcium 8.9 - 10.3 mg/dL 8.2(L) 8.5(L) -  Total Protein 6.5 - 8.1 g/dL 7.1 7.4 6.9  Total Bilirubin 0.3 - 1.2 mg/dL 0.6 0.7 -  Alkaline Phos 38 - 126 U/L 64  66 -  AST 15 - 41 U/L 13(L) 17 -  ALT 0 - 44 U/L 12 14 -    DIAGNOSTIC IMAGING:  I have independently reviewed the scans and discussed with the patient. No results found.   ASSESSMENT:  1. Castration resistant prostate cancer: -T3 N1 M0 Gleason 4+4=8 prostate cancer with metastatic adenopathy in the right iliac chain diagnosed on 09/26/2011, PSA 11.2, treated with ADT only and PSA nadired at around 0.12 in February 2014. Subsequently slowly rising PSA to 0.32 in October 2014 and 0.66 in February 2015. Staging scans in March 2015 -. He was treated with salvage radiation therapy with bicalutamide. ADT was continued until March 2018. Staging work-up at diagnosis was negative for bone mets. -PSA 0.81 (09/24/2017), 1.6 (9/19), 1.3 (03/2019) 3.2 (07/18/2019), 4.2 (11/14/2019), testosterone less than 3. -Fluciclovine PET scan on 08/07/2019 showed asymmetric activity in the right prostate gland representing residual carcinoma with no evidence of metastatic adenopathy and no skeletal lesions. -Last Lupron  injection 45 mg on 07/18/2019. -Fluciclovine PET scan on 12/18/2019 shows persistent hypermetabolic involving the prostate, slightly increased on the left. Similar left and slight increase in right inguinal nodal low-level hypermetabolism, favored to be reactive but warranting follow-up. Increased hypermetabolic within the enlarging thoracic lymph nodes, cannot exclude atypical distribution of nodal metastasis. -Enzalutamide started around 01/07/2020.  2. Social/family history: -Half pack per day current active smoker for 48 years. -Father and 2 brothers had kidney cancer.   PLAN:  1.Metastatic castration resistant prostate cancer: -Enzalutamide increased to 120 mg daily on 01/19/2020.  Last Lupron was on 01/30/2020, 45 mg. -He is tolerating it very well.  He had one fall about 10 days ago.  He has occasional falls, due to unknown reasons for the past couple of years and was evaluated by neurology. -Reviewed labs from 02/11/2020.  LFTs are normal.  Creatinine slightly improved to 2.22.  CBC was grossly unchanged. -Continue enzalutamide 120 mg daily.  Plan to see him back in 2 months.  I will repeat PSA at that time.  2. Neuropathy: -Continue Lyrica 25 mg twice daily.  3.  Overactive bladder: -Patient received 1 month sample of Vibegron 75 mg from Dr. Roni Bread which has been helping.   Orders placed this encounter:  Orders Placed This Encounter  Procedures  . CBC with Differential  . Comprehensive metabolic panel  . PSA     Derek Jack, MD [place] [phone]   I, Milinda Antis, am acting as a scribe for Dr. Sanda Linger.  I, Derek Jack MD, have reviewed the above documentation for accuracy and completeness, and I agree with the above.

## 2020-02-12 NOTE — Patient Instructions (Signed)
[facility] at [facility] Discharge Instructions  You were seen today by Dr. Katragadda. He went over your recent results. Dr. Katragadda will see you back in 2 months for labs and follow up.   Thank you for choosing [facility] at [facility] to provide your oncology and hematology care.  To afford each patient quality time with our provider, please arrive at least 15 minutes before your scheduled appointment time.   If you have a lab appointment with the Cancer Center please come in thru the Main Entrance and check in at the main information desk  You need to re-schedule your appointment should you arrive 10 or more minutes late.  We strive to give you quality time with our providers, and arriving late affects you and other patients whose appointments are after yours.  Also, if you no show three or more times for appointments you may be dismissed from the clinic at the providers discretion.     Again, thank you for choosing [facility].  Our hope is that these requests will decrease the amount of time that you wait before being seen by our physicians.       _____________________________________________________________  Should you have questions after your visit to [facility], please contact our office at [phone] between the hours of 8:00 a.m. and 4:30 p.m.  Voicemails left after 4:00 p.m. will not be returned until the following business day.  For prescription refill requests, have your pharmacy contact our office and allow 72 hours.    Cancer Center Support Programs:   > Cancer Support Group  2nd Tuesday of the month 1pm-2pm, Journey Room   

## 2020-02-13 ENCOUNTER — Other Ambulatory Visit (HOSPITAL_COMMUNITY): Payer: Self-pay

## 2020-02-13 DIAGNOSIS — C61 Malignant neoplasm of prostate: Secondary | ICD-10-CM

## 2020-02-13 MED ORDER — ENZALUTAMIDE 40 MG PO TABS: 120.0000 mg | ORAL_TABLET | Freq: Every day | ORAL | 3 refills | Status: DC

## 2020-02-13 NOTE — Telephone Encounter (Signed)
Xtandi prescription sent to [facility] and cancelled at [facility] per patient's request. Per Vicente Males with [facility] they will deliver medication to the patient's home urgently. Patient aware.

## 2020-03-06 ENCOUNTER — Other Ambulatory Visit: Payer: Self-pay | Admitting: Family Medicine

## 2020-03-06 DIAGNOSIS — F339 Major depressive disorder, recurrent, unspecified: Secondary | ICD-10-CM

## 2020-03-06 DIAGNOSIS — G6289 Other specified polyneuropathies: Secondary | ICD-10-CM

## 2020-03-25 ENCOUNTER — Other Ambulatory Visit: Payer: Self-pay | Admitting: Family Medicine

## 2020-03-25 DIAGNOSIS — I10 Essential (primary) hypertension: Secondary | ICD-10-CM

## 2020-04-02 ENCOUNTER — Other Ambulatory Visit: Payer: Self-pay | Admitting: Family Medicine

## 2020-04-02 ENCOUNTER — Other Ambulatory Visit (HOSPITAL_COMMUNITY): Payer: Self-pay | Admitting: Hematology

## 2020-04-08 ENCOUNTER — Other Ambulatory Visit: Payer: Self-pay | Admitting: Family Medicine

## 2020-04-08 DIAGNOSIS — I10 Essential (primary) hypertension: Secondary | ICD-10-CM

## 2020-04-09 ENCOUNTER — Other Ambulatory Visit (HOSPITAL_COMMUNITY): Payer: Self-pay

## 2020-04-09 DIAGNOSIS — C61 Malignant neoplasm of prostate: Secondary | ICD-10-CM

## 2020-04-09 MED ORDER — ENZALUTAMIDE 40 MG PO TABS: 120.0000 mg | ORAL_TABLET | Freq: Every day | ORAL | 3 refills | Status: DC

## 2020-04-09 NOTE — Telephone Encounter (Signed)
Notification received from [facility] that they needed updated Xtandi prescription with ICD-10 code included. New prescription faxed with ICD-10 code information included per Dr. Tomie China orders.

## 2020-04-14 ENCOUNTER — Other Ambulatory Visit: Payer: Self-pay

## 2020-04-14 ENCOUNTER — Inpatient Hospital Stay (HOSPITAL_COMMUNITY): Payer: Medicare Other | Attending: Hematology

## 2020-04-14 DIAGNOSIS — N183 Chronic kidney disease, stage 3 unspecified: Secondary | ICD-10-CM | POA: Diagnosis not present

## 2020-04-14 DIAGNOSIS — Z8249 Family history of ischemic heart disease and other diseases of the circulatory system: Secondary | ICD-10-CM | POA: Diagnosis not present

## 2020-04-14 DIAGNOSIS — F17210 Nicotine dependence, cigarettes, uncomplicated: Secondary | ICD-10-CM | POA: Insufficient documentation

## 2020-04-14 DIAGNOSIS — Z8051 Family history of malignant neoplasm of kidney: Secondary | ICD-10-CM | POA: Insufficient documentation

## 2020-04-14 DIAGNOSIS — J449 Chronic obstructive pulmonary disease, unspecified: Secondary | ICD-10-CM | POA: Diagnosis not present

## 2020-04-14 DIAGNOSIS — N3281 Overactive bladder: Secondary | ICD-10-CM | POA: Insufficient documentation

## 2020-04-14 DIAGNOSIS — Z79899 Other long term (current) drug therapy: Secondary | ICD-10-CM | POA: Diagnosis not present

## 2020-04-14 DIAGNOSIS — G629 Polyneuropathy, unspecified: Secondary | ICD-10-CM | POA: Insufficient documentation

## 2020-04-14 DIAGNOSIS — C61 Malignant neoplasm of prostate: Secondary | ICD-10-CM | POA: Insufficient documentation

## 2020-04-14 DIAGNOSIS — I10 Essential (primary) hypertension: Secondary | ICD-10-CM | POA: Diagnosis not present

## 2020-04-14 DIAGNOSIS — Z23 Encounter for immunization: Secondary | ICD-10-CM | POA: Insufficient documentation

## 2020-04-14 LAB — COMPREHENSIVE METABOLIC PANEL
ALT: 10 U/L (ref 0–44)
AST: 12 U/L — ABNORMAL LOW (ref 15–41)
Albumin: 3.4 g/dL — ABNORMAL LOW (ref 3.5–5.0)
Alkaline Phosphatase: 57 U/L (ref 38–126)
Anion gap: 7 (ref 5–15)
BUN: 26 mg/dL — ABNORMAL HIGH (ref 8–23)
CO2: 26 mmol/L (ref 22–32)
Calcium: 8.4 mg/dL — ABNORMAL LOW (ref 8.9–10.3)
Chloride: 104 mmol/L (ref 98–111)
Creatinine, Ser: 2.2 mg/dL — ABNORMAL HIGH (ref 0.61–1.24)
GFR, Estimated: 30 mL/min — ABNORMAL LOW (ref >60)
Glucose, Bld: 107 mg/dL — ABNORMAL HIGH (ref 70–99)
Potassium: 3.5 mmol/L (ref 3.5–5.1)
Sodium: 137 mmol/L (ref 135–145)
Total Bilirubin: 0.5 mg/dL (ref 0.3–1.2)
Total Protein: 6.8 g/dL (ref 6.5–8.1)

## 2020-04-14 LAB — CBC WITH DIFFERENTIAL/PLATELET
Abs Immature Granulocytes: 0.02 10*3/uL (ref 0.00–0.07)
Basophils Absolute: 0 10*3/uL (ref 0.0–0.1)
Basophils Relative: 1 %
Eosinophils Absolute: 0.3 10*3/uL (ref 0.0–0.5)
Eosinophils Relative: 6 %
HCT: 34.5 % — ABNORMAL LOW (ref 39.0–52.0)
Hemoglobin: 11.3 g/dL — ABNORMAL LOW (ref 13.0–17.0)
Immature Granulocytes: 0 %
Lymphocytes Relative: 11 %
Lymphs Abs: 0.6 10*3/uL — ABNORMAL LOW (ref 0.7–4.0)
MCH: 30.2 pg (ref 26.0–34.0)
MCHC: 32.8 g/dL (ref 30.0–36.0)
MCV: 92.2 fL (ref 80.0–100.0)
Monocytes Absolute: 0.4 10*3/uL (ref 0.1–1.0)
Monocytes Relative: 7 %
Neutro Abs: 4.4 10*3/uL (ref 1.7–7.7)
Neutrophils Relative %: 75 %
Platelets: 188 10*3/uL (ref 150–400)
RBC: 3.74 MIL/uL — ABNORMAL LOW (ref 4.22–5.81)
RDW: 15.1 % (ref 11.5–15.5)
WBC: 5.8 10*3/uL (ref 4.0–10.5)
nRBC: 0 % (ref 0.0–0.2)

## 2020-04-14 LAB — PSA: Prostatic Specific Antigen: 0.3 ng/mL (ref 0.00–4.00)

## 2020-04-19 ENCOUNTER — Ambulatory Visit (INDEPENDENT_AMBULATORY_CARE_PROVIDER_SITE_OTHER): Payer: Medicare Other | Admitting: Family Medicine

## 2020-04-19 ENCOUNTER — Encounter: Payer: Self-pay | Admitting: Family Medicine

## 2020-04-19 ENCOUNTER — Other Ambulatory Visit: Payer: Self-pay

## 2020-04-19 DIAGNOSIS — R42 Dizziness and giddiness: Secondary | ICD-10-CM

## 2020-04-19 DIAGNOSIS — R2689 Other abnormalities of gait and mobility: Secondary | ICD-10-CM | POA: Diagnosis not present

## 2020-04-19 DIAGNOSIS — B372 Candidiasis of skin and nail: Secondary | ICD-10-CM | POA: Diagnosis not present

## 2020-04-19 MED ORDER — NYSTATIN 100000 UNIT/GM EX POWD: 1.0000 "application " | Freq: Three times a day (TID) | CUTANEOUS | 0 refills | Status: DC

## 2020-04-19 NOTE — Progress Notes (Signed)
Virtual Visit via telephone Note  I connected with [name] on 04/19/20 at 1026 by telephone and verified that I am speaking with the correct person using two identifiers. [name] is currently located at home and patient are currently with her during visit. The provider, [name] Wai Litt, MD is located in their office at time of visit.  Call ended at 1037  I discussed the limitations, risks, security and privacy concerns of performing an evaluation and management service by telephone and the availability of in person appointments. I also discussed with the patient that there may be a patient responsible charge related to this service. The patient expressed understanding and agreed to proceed.   History and Present Illness: Patient is having yeast and sores in stomach and in groin and used OTC cream and it is not clearing it up.  He says sometimes he gets to where it is open sores  Patient is still having balance and falling and is not getting any better, he has seen both ENT x2 and neurology and they are really not finding answers and just recommended physical therapy.  He is willing to go and do a physical therapy.  He has had this once previously in his life many years ago and the physical therapy took a year but did finally cure it.  No diagnosis found.  Outpatient Encounter Medications as of 04/19/2020  Medication Sig  . allopurinol (ZYLOPRIM) 300 MG tablet Take 1 tablet by mouth once daily  . amLODipine (NORVASC) 10 MG tablet Take 1 tablet (10 mg total) by mouth daily.  . carvedilol (COREG) 25 MG tablet Take 1 tablet by mouth twice daily  . DULoxetine (CYMBALTA) 60 MG capsule Take 1 capsule by mouth once daily  . enzalutamide (XTANDI) 40 MG tablet Take 3 tablets (120 mg total) by mouth daily.  [name] losartan (COZAAR) 50 MG tablet Take 1 tablet by mouth once daily  . pantoprazole (PROTONIX) 40 MG tablet Take 40 mg by mouth 2 (two) times daily.  . pregabalin (LYRICA)  25 MG capsule Take 1 capsule by mouth twice daily  . rosuvastatin (CRESTOR) 20 MG tablet Take 1 tablet (20 mg total) by mouth daily.  . tamsulosin (FLOMAX) 0.4 MG CAPS capsule Take 1 capsule (0.4 mg total) by mouth daily.  . traMADol (ULTRAM) 50 MG tablet Take 1 tablet (50 mg total) by mouth daily as needed.  . Vibegron (GEMTESA) 75 MG TABS Take 75 mg by mouth daily.   No facility-administered encounter medications on file as of 04/19/2020.    Review of Systems  Constitutional: Negative for chills and fever.  Respiratory: Negative for shortness of breath and wheezing.   Cardiovascular: Negative for chest pain and leg swelling.  Musculoskeletal: Positive for gait problem.  Skin: Positive for color change and rash.  Neurological: Positive for dizziness. Negative for weakness, light-headedness and numbness.  All other systems reviewed and are negative.   Observations/Objective: Patient sounds comfortable and in no acute distress  Assessment and Plan: Problem List Items Addressed This Visit   None   Visit Diagnoses    Dizziness    -  Primary   Relevant Orders   Ambulatory referral to Physical Therapy   Balance disorder       Relevant Orders   Ambulatory referral to Physical Therapy   Yeast dermatitis       Relevant Medications   nystatin (MYCOSTATIN/NYSTOP) powder      Will do referral for physical therapy for  balance, do nystatin powder for his yeast. Follow up plan: No follow-ups on file.     I discussed the assessment and treatment plan with the patient. The patient was provided an opportunity to ask questions and all were answered. The patient agreed with the plan and demonstrated an understanding of the instructions.   The patient was advised to call back or seek an in-person evaluation if the symptoms worsen or if the condition fails to improve as anticipated.  The above assessment and management plan was discussed with the patient. The patient verbalized  understanding of and has agreed to the management plan. Patient is aware to call the clinic if symptoms persist or worsen. Patient is aware when to return to the clinic for a follow-up visit. Patient educated on when it is appropriate to go to the emergency department.    I provided 11 minutes of non-face-to-face time during this encounter.    Worthy Rancher, MD

## 2020-04-21 ENCOUNTER — Inpatient Hospital Stay (HOSPITAL_BASED_OUTPATIENT_CLINIC_OR_DEPARTMENT_OTHER): Payer: Medicare Other | Admitting: Hematology

## 2020-04-21 ENCOUNTER — Other Ambulatory Visit: Payer: Self-pay

## 2020-04-21 VITALS — BP 156/80 | HR 76 | Temp 96.9°F | Resp 20 | Wt 283.0 lb

## 2020-04-21 DIAGNOSIS — J449 Chronic obstructive pulmonary disease, unspecified: Secondary | ICD-10-CM | POA: Diagnosis not present

## 2020-04-21 DIAGNOSIS — N3281 Overactive bladder: Secondary | ICD-10-CM | POA: Diagnosis not present

## 2020-04-21 DIAGNOSIS — N183 Chronic kidney disease, stage 3 unspecified: Secondary | ICD-10-CM | POA: Diagnosis not present

## 2020-04-21 DIAGNOSIS — G629 Polyneuropathy, unspecified: Secondary | ICD-10-CM | POA: Diagnosis not present

## 2020-04-21 DIAGNOSIS — Z23 Encounter for immunization: Secondary | ICD-10-CM | POA: Diagnosis not present

## 2020-04-21 DIAGNOSIS — C61 Malignant neoplasm of prostate: Secondary | ICD-10-CM | POA: Diagnosis not present

## 2020-04-21 NOTE — Progress Notes (Signed)
[name], [place] [address]   CLINIC:  Medical Oncology/Hematology  PCP:  [name], [name], MD [place] / [place] [address] [phone]   REASON FOR VISIT:  Follow-up for recurrent prostate cancer  PRIOR THERAPY: External beam radiation therapy with bicalutamide  NGS Results: Not done  CURRENT THERAPY: Enzalutamide 120 mg QD  BRIEF ONCOLOGIC HISTORY:  Oncology History   No history exists.    CANCER STAGING: Cancer Staging Prostate cancer [facility]) Staging form: Prostate, AJCC 7th Edition - Clinical: Stage IV (pT3b, N1, M0) - Signed by Wyatt Portela, MD on 07/09/2013   INTERVAL HISTORY:  [name], a 75 y.o. male, returns for routine follow-up of his recurrent prostate cancer. Sherlock was last seen on 02/12/2020.   Today he reports feeling okay. He is tolerating the enzalutamide well and denies having any new pains, though he reports having weakness in his legs. His neuropathy is well controlled during the day with Lyrica, though he continues getting up around 12 times per night, even while taking Gemtesa. He will start PT on 1/24.  He will see Dr. [name] next week to discuss Gemtesa.   REVIEW OF SYSTEMS:  Review of Systems  Constitutional: Positive for fatigue (50%). Negative for appetite change.  Neurological: Positive for extremity weakness (in legs) and numbness (feet).  All other systems reviewed and are negative.   PAST MEDICAL/SURGICAL HISTORY:  Past Medical History:  Diagnosis Date  . Aortic atherosclerosis ([place])    2004  s/p  closure Penetrating atherosclerotic ulcer of infarenal aorta w/ endovascular stent graft  . Arthritis   . CKD (chronic kidney disease), stage III ([place])   . Closed fracture of head of humerus 07/2017   right shoulder from fall; 09-25-2017 per pt no surgical intervention, only wore sling, intermittant pain  . Coronary atherosclerosis of native coronary artery    positive  myoview for ischemia 09-27-2009;  10-01-2009 per cardiac cath-- minimal nonobstructive CAD w/ 30% LAD   . ED (erectile dysfunction)   . Emphysema/COPD [facility])    followed by pcp--- last exacerbation 09-12-2017;  09-25-2017 per pt no cough, sob or congestion  . Essential hypertension   . Family history of kidney cancer   . First degree heart block   . Frequency of urination   . GERD (gastroesophageal reflux disease)   . Gout    09-25-2017  per pt stable,  last episode 2015  . Heart murmur   . History of adenomatous polyp of colon   . History of closed head injury 2005   per pt residual resolved  . History of external beam radiation therapy    completed 10/ 2015 for prostate cancer  . History of gastric ulcer 2014  . History of kidney stones   . History of pneumothorax    spontaneous pneumo treated w/ chest tube  . History of rib fracture 07/2017   from fall,  right side 6th,7th,8th  . Neuropathy   . OAB (overactive bladder)   . OSA (obstructive sleep apnea)    Intolerant of CPAP  . Prostate cancer ([place])    dx 09-26-2011 via bx-- Stage T3b,N0,  Gleason 4+4, PSA 11.2 with METs to external iliac lymph node(resolved with ADT)-- treated w/ ADT ;   05/2013 staging work-up for rising PSA , started casodex and completed radiation therapy 10/ 2015;   rising PSA post treatment  . RLS (restless legs syndrome)   . Ureteral neocystostomy  bleed    left side  . Urge urinary incontinence    Past Surgical History:  Procedure Laterality Date  . ABDOMINAL AORTIC ENDOVASCULAR STENT GRAFT  09-13-2007    dr [name]  [facility]   closure penetrating atherosclerotic ulcer of infarenal aorta with stent graft  . BIOPSY  03/06/2019   Procedure: BIOPSY;  Surgeon: [name], MD;  [facility];  Service: Endoscopy;;  gastric  . CARDIAC CATHETERIZATION  2005   per pt normal (done in [place])  . CARDIAC CATHETERIZATION  10/06/2009    dr cooper   minimal nonobstructive CAD w/30% LAD otherwise  normal coronaries, LVEDP 36mmHg  . CARDIOVASCULAR STRESS TEST  09/27/2009    dr Aundra Dubin   lexiscan nuclear study w/ moderate reversible inferior perfusion defect ischemia,  ef 60% (cardiac cath scheduled)  . CHEST TUBE INSERTION  1989   "collapsed lung"due to injury  . CIRCUMCISION  09/26/2011   Procedure: CIRCUMCISION ADULT;  Surgeon: [name], MD;  Location: [facility];  Service: Urology;  Laterality: N/A;  . COLONOSCOPY W/ POLYPECTOMY    . COLONOSCOPY WITH PROPOFOL N/A 04/27/2016    eight 4-8 mm polyps in descending colon, at splenic flexure, in ascending colon and cecum. Tubular adenomas. Surveillance in 3 years.  . CYSTOSCOPY  09/26/2011   Procedure: CYSTOSCOPY;  Surgeon: [name], MD;  Location: [facility];  Service: Urology;  Laterality: N/A;  . CYSTOSCOPY/RETROGRADE/URETEROSCOPY Left 09/27/2017   Procedure: CYSTOSCOPYLEFT Marlow Baars AND RENAL WASHINGS;  Surgeon: [name], MD;  Location[facility];  Service: Urology;  Laterality: Left;  . ESOPHAGOGASTRODUODENOSCOPY (EGD) WITH PROPOFOL N/A 04/27/2016   normal esophagus s/p dilation, small hiatal hernia, normal duodenum  . ESOPHAGOGASTRODUODENOSCOPY (EGD) WITH PROPOFOL N/A 03/06/2019   erosive reflux esophagitis, s/p dilation, normal duodenum, abnormal gastric mucosa s/p biopsy. Hyperplastic gastric polyp. No H.pylori.   [name] EXTRACORPOREAL SHOCK WAVE LITHOTRIPSY  yrs ago  . FRACTURE SURGERY  child   Bilateral lower arms   . KNEE ARTHROSCOPY Right 2013  . MALONEY DILATION N/A 04/27/2016   Procedure: Venia Minks DILATION;  Surgeon: [name], MD;  [facility];  Service: Endoscopy;  Laterality: N/A;  . Venia Minks DILATION N/A 03/06/2019   Procedure: Venia Minks DILATION;  Surgeon: [name], MD;  [facility];  Service: Endoscopy;  Laterality: N/A;  . PARTIAL KNEE ARTHROPLASTY Right 04/26/2015   Procedure: RIGHT KNEE MEDIAL UNICOMPARTMENTAL ARTHROPLASTY;  Surgeon: Gaynelle Arabian, MD;  Location:  [facility];  Service: Orthopedics;  Laterality: Right;  . POLYPECTOMY  04/27/2016   Procedure: POLYPECTOMY;  Surgeon: [name], MD;  [facility];  Service: Endoscopy;;  cecal , ascending, descending, and sigmoid polypectomies  . PROSTATE BIOPSY  09/26/2011   Procedure: BIOPSY TRANSRECTAL ULTRASONIC PROSTATE (TUBP);  Surgeon: [name], MD;  Location[facility];  Service: Urology;  Laterality: N/A;     . TRANSTHORACIC ECHOCARDIOGRAM  01-07-2013   dr [name]   ef 60-65%,  grade 1 diastolic dysfunction/  mild AR without stenosis/  mild LAE/  mild to moderate calcificed MV annulus without regurg. or stenosis/  . UMBILICAL HERNIA REPAIR  yrs ago    SOCIAL HISTORY:  Social History   Socioeconomic History  . Marital status: Married    Spouse name: Not on file  . Number of children: 3  . Years of education: 79  . Highest education level: Some college, no degree  Occupational History  . Occupation: Retired  Comment: [employer]    Comment: Part Time at [employer]  Tobacco Use  . Smoking status: Current Every Day Smoker    Packs/day: 0.50    Years: 42.00    Pack years: 21.00    Types: Cigarettes  . Smokeless tobacco: Never Used  Vaping Use  . Vaping Use: Never used  Substance and Sexual Activity  . Alcohol use: No    Alcohol/week: 0.0 standard drinks  . Drug use: No  . Sexual activity: Yes    Birth control/protection: None  Other Topics Concern  . Not on file  Social History Narrative   Patient is retired from the [employer]. He lives in a one story home with his wife. He moved from [place] [facility] about 9 years ago. They do not have family on the [place] and although he is very outgoing he feels socially isolated. He isn't a member of any groups or churches.    Social Determinants of Health   Financial Resource Strain: High Risk  . Difficulty of Paying Living Expenses: Hard  Food Insecurity: Not on file  Transportation Needs: No Transportation  Needs  . Lack of Transportation (Medical): No  . Lack of Transportation (Non-Medical): No  Physical Activity: Not on file  Stress: Not on file  Social Connections: Not on file  Intimate Partner Violence: Not At Risk  . Fear of Current or Ex-Partner: No  . Emotionally Abused: No  . Physically Abused: No  . Sexually Abused: No    FAMILY HISTORY:  Family History  Problem Relation Age of Onset  . Stroke Mother   . Alzheimer's disease Mother 32       probably due to head injury  . Mesothelioma Father 37  . Kidney cancer Father   . Hyperlipidemia Sister   . Heart attack Sister   . Kidney cancer Sister        dx in her 26s  . Heart disease Brother   . Kidney cancer Brother 28       dx in his 83s  . Hyperlipidemia Brother   . Kidney cancer Brother        dx in his 32s  . Healthy Daughter   . Healthy Son   . Healthy Son   . Colon cancer Neg Hx     CURRENT MEDICATIONS:  Current Outpatient Medications  Medication Sig Dispense Refill  . allopurinol (ZYLOPRIM) 300 MG tablet Take 1 tablet by mouth once daily 90 tablet 0  . amLODipine (NORVASC) 10 MG tablet Take 1 tablet (10 mg total) by mouth daily. 90 tablet 0  . carvedilol (COREG) 25 MG tablet Take 1 tablet by mouth twice daily 180 tablet 0  . DULoxetine (CYMBALTA) 60 MG capsule Take 1 capsule by mouth once daily 30 capsule 1  . enzalutamide (XTANDI) 40 MG tablet Take 3 tablets (120 mg total) by mouth daily. 90 tablet 3  . losartan (COZAAR) 50 MG tablet Take 1 tablet by mouth once daily 90 tablet 0  . nystatin (MYCOSTATIN/NYSTOP) powder Apply 1 application topically 3 (three) times daily. 15 g 0  . pantoprazole (PROTONIX) 40 MG tablet Take 40 mg by mouth 2 (two) times daily.    . pregabalin (LYRICA) 25 MG capsule Take 1 capsule by mouth twice daily 60 capsule 0  . tamsulosin (FLOMAX) 0.4 MG CAPS capsule Take 1 capsule (0.4 mg total) by mouth daily. 30 capsule 1  . traMADol (ULTRAM) 50 MG tablet Take 1 tablet (50 mg  total) by  mouth daily as needed. 30 tablet 1  . Vibegron (GEMTESA) 75 MG TABS Take 75 mg by mouth daily. 28 tablet 0  . rosuvastatin (CRESTOR) 20 MG tablet Take 1 tablet (20 mg total) by mouth daily. 90 tablet 3   No current facility-administered medications for this visit.    ALLERGIES:  Allergies  Allergen Reactions  . Carbidopa-Levodopa Other (See Comments)    hallunications  . Morphine Sulfate Nausea And Vomiting  . Sulfa Antibiotics Nausea And Vomiting  . Sulfacetamide Sodium Nausea And Vomiting    PHYSICAL EXAM:  Performance status (ECOG): 1 - Symptomatic but completely ambulatory  Vitals:   04/21/20 1420 04/21/20 1421  BP: (!) 156/80 (!) 156/80  Pulse: 76 76  Resp: 20 20  Temp: (!) 96.9 F (36.1 C) (!) 96.9 F (36.1 C)  SpO2: 98% 98%   Wt Readings from Last 3 Encounters:  04/21/20 283 lb (128.4 kg)  02/12/20 286 lb 6.4 oz (129.9 kg)  01/30/20 289 lb (131.1 kg)   Physical Exam Vitals reviewed.  Constitutional:      Appearance: Normal appearance. He is obese.  Neurological:     General: No focal deficit present.     Mental Status: He is alert and oriented to person, place, and time.  Psychiatric:        Mood and Affect: Mood normal.        Behavior: Behavior normal.      LABORATORY DATA:  I have reviewed the labs as listed.  CBC Latest Ref Rng & Units 04/14/2020 02/11/2020 01/19/2020  WBC 4.0 - 10.5 K/uL 5.8 6.3 6.6  Hemoglobin 13.0 - 17.0 g/dL 11.3(L) 11.3(L) 12.0(L)  Hematocrit 39.0 - 52.0 % 34.5(L) 35.1(L) 36.8(L)  Platelets 150 - 400 K/uL 188 208 202   CMP Latest Ref Rng & Units 04/14/2020 02/11/2020 01/19/2020  Glucose 70 - 99 mg/dL 107(H) 110(H) 112(H)  BUN 8 - 23 mg/dL 26(H) 23 29(H)  Creatinine 0.61 - 1.24 mg/dL 2.20(H) 2.22(H) 2.58(H)  Sodium 135 - 145 mmol/L 137 137 141  Potassium 3.5 - 5.1 mmol/L 3.5 3.5 3.7  Chloride 98 - 111 mmol/L 104 105 104  CO2 22 - 32 mmol/L 26 23 24   Calcium 8.9 - 10.3 mg/dL 8.4(L) 8.2(L) 8.5(L)  Total Protein 6.5 - 8.1  g/dL 6.8 7.1 7.4  Total Bilirubin 0.3 - 1.2 mg/dL 0.5 0.6 0.7  Alkaline Phos 38 - 126 U/L 57 64 66  AST 15 - 41 U/L 12(L) 13(L) 17  ALT 0 - 44 U/L 10 12 14    PSA 0.30 04/14/2020  PSA 0.81 01/19/2020  PSA 4.2 (H) 11/14/2019    DIAGNOSTIC IMAGING:  I have independently reviewed the scans and discussed with the patient. No results found.   ASSESSMENT:  1. Castration resistant prostate cancer: -T3 N1 M0 Gleason 4+4=8 prostate cancer with metastatic adenopathy in the right iliac chain diagnosed on 09/26/2011, PSA 11.2, treated with ADT only and PSA nadired at around 0.12 in February 2014. Subsequently slowly rising PSA to 0.32 in October 2014 and 0.66 in February 2015. Staging scans in March 2015 -. He was treated with salvage radiation therapy with bicalutamide. ADT was continued until March 2018. Staging work-up at diagnosis was negative for bone mets. -PSA 0.81 (09/24/2017), 1.6 (9/19), 1.3 (03/2019) 3.2 (07/18/2019), 4.2 (11/14/2019), testosterone less than 3. -Fluciclovine PET scan on 08/07/2019 showed asymmetric activity in the right prostate gland representing residual carcinoma with no evidence of metastatic adenopathy and no skeletal lesions. -Last  Lupron injection 45 mg on 07/18/2019. -Fluciclovine PET scan on 12/18/2019 shows persistent hypermetabolic involving the prostate, slightly increased on the left. Similar left and slight increase in right inguinal nodal low-level hypermetabolism, favored to be reactive but warranting follow-up. Increased hypermetabolic within the enlarging thoracic lymph nodes, cannot exclude atypical distribution of nodal metastasis. -Enzalutamide started around 01/07/2020.  2. Social/family history: -Half pack per day current active smoker for 49 years. -Father and 2 brothers had kidney cancer.   PLAN:  1.Metastatic castration resistant prostate cancer: -He is tolerating enzalutamide 120 mg daily very well. - Reviewed labs from 04/14/2020.  PSA  improved to 0.3 from 0.81 in October. - LFTs are within normal limits. - RTC 3 months with PSA and testosterone levels. - Continue Lupron injections at Dr. Jethro Poling office.  2. Neuropathy: -Continue Lyrica 25 mg twice daily.  3.  Overactive bladder: -He has used multiple medications without much help. - He will follow-up with Dr. Roni Bread within the next week.  4.  CKD: - Creatinine has improved to 2.20.  We will closely monitor.   Orders placed this encounter:  Orders Placed This Encounter  Procedures  . CBC with Differential  . Comprehensive metabolic panel  . PSA  . Testosterone     Derek Jack, MD [place] [phone]   I, Milinda Antis, am acting as a scribe for Dr. Sanda Linger.  I, Derek Jack MD, have reviewed the above documentation for accuracy and completeness, and I agree with the above.

## 2020-04-21 NOTE — Patient Instructions (Signed)
[place] at [facility] Discharge Instructions  You were seen today by Dr. Delton Coombes. He went over your recent results. Continue taking enzalutamide daily. Dr. Delton Coombes will see you back in 3 months for labs and follow up.   Thank you for choosing [place] at [facility] to provide your oncology and hematology care.  To afford each patient quality time with our provider, please arrive at least 15 minutes before your scheduled appointment time.   If you have a lab appointment with the [facility] please come in thru the Main Entrance and check in at the main information desk  You need to re-schedule your appointment should you arrive 10 or more minutes late.  We strive to give you quality time with our providers, and arriving late affects you and other patients whose appointments are after yours.  Also, if you no show three or more times for appointments you may be dismissed from the clinic at the providers discretion.     Again, thank you for choosing [facility].  Our hope is that these requests will decrease the amount of time that you wait before being seen by our physicians.       _____________________________________________________________  Should you have questions after your visit to [facility], please contact our office at [phone] between the hours of 8:00 a.m. and 4:30 p.m.  Voicemails left after 4:00 p.m. will not be returned until the following business day.  For prescription refill requests, have your pharmacy contact our office and allow 72 hours.    Cancer Center Support Programs:   > Cancer Support Group  2nd Tuesday of the month 1pm-2pm, Journey Room

## 2020-04-27 ENCOUNTER — Encounter (HOSPITAL_COMMUNITY): Payer: Self-pay | Admitting: Physical Therapy

## 2020-04-27 ENCOUNTER — Ambulatory Visit (HOSPITAL_COMMUNITY): Payer: Medicare Other | Attending: Family Medicine | Admitting: Physical Therapy

## 2020-04-27 ENCOUNTER — Other Ambulatory Visit: Payer: Self-pay

## 2020-04-27 DIAGNOSIS — R42 Dizziness and giddiness: Secondary | ICD-10-CM | POA: Diagnosis not present

## 2020-04-27 DIAGNOSIS — R262 Difficulty in walking, not elsewhere classified: Secondary | ICD-10-CM | POA: Insufficient documentation

## 2020-04-27 NOTE — Patient Instructions (Signed)
Access Code: [email or web address] URL: [email or web address] Date: 04/27/2020 Prepared by: Josue Hector  Exercises Seated Horizontal Saccades - 3 x daily - 7 x weekly - 2 sets - 10 reps

## 2020-04-27 NOTE — Progress Notes (Signed)
Subjective:  1. Prostate cancer ([place])   2. 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 Rising PSA following treatment for malignant neoplasm of prostate   4. Microhematuria   5. Urge incontinence   6. Overactive bladder   7. Nocturia       04/29/20: [name] returns in f/u for his locally recurrent prostate cancer now on ADT. His PSA was up to 1.3 on ADT with a testosterone of <10 in 12/20 then 3.2 in 4/21  And 4.2 with a T of <3 in 7/21.   He remains on Lupron and was started on Xtandi  by Dr. Delton Coombes.  His PSA has fallen further to 0.3 from  0.81 since his last visit.    He continues to have nocturia q1.5hrs.   He has bilateral ankle edema.  He has UUI and wears pads.  He is back on tamsulosin but didn't benefit from taking Gemtesa.  He had some benefit with Myrbetriq in the past but got constipated with it.  His weight is stable. He has had no bone pain but he has knee pain and has difficulty ambulating.  He was going to see Dr. Maureen Ralphs but apparently has not yet.  His UA has 3-10 RBC's.    A PET prior to initiation of the firmagon for a rising PSA that increased to 4.0 with a <3 month PSADT showed local uptake on the PET in the prostate with no mets. He had some pulmonary nodules and a CHest was obtained and 3-6 mo f/u imaging was recommended. The CT Chest was done on 08/12/18 and f/u in 6-12 months was recommended. He is doing well without hot flashes or other complaints. He continues to have nocturia q1.5hrs which may be somewhat better since his PCP added tamsulosin. He has some daytime frequency q2-3hrs. He has intermittency and a sensation of incomplete emptying. He has a variable stream. His IPSS is 21.  He has CKD of 2.16 on recent labs.  His UA had 3-10 RBC's on 4/1.   A renal US in 8/20 didn't show any stones.  He has cysts and some atrophy.   He has a history of T3 N1M0 Gleason 8 prostate cancer with metastatic adenopathy in the right iliac chain. He was diagnosed on 09/26/11. He  completed radiation therapy in late October 2015. He has been off of the ADT since 3/18 and his PSA has been rising and is up to 4.0 from 1.6 in 9/19 and from 0.81 on 09/24/17 with a normalized testosterone.   He had cystoscopy and ureteroscopy in June 2019 for gross hematuria from the left collecting system. He has no associated signs or symptoms.     IPSS    Row Name 04/29/20 1100         International Prostate Symptom Score   How often have you had the sensation of not emptying your bladder? More than half the time     How often have you had to urinate less than every two hours? More than half the time     How often have you found you stopped and started again several times when you urinated? Less than half the time     How often have you found it difficult to postpone urination? About half the time     How often have you had a weak urinary stream? About half the time     How often have you had to strain to start urination? Not at All  How many times did you typically get up at night to urinate? 5 Times     Total IPSS Score 21           Quality of Life due to urinary symptoms   If you were to spend the rest of your life with your urinary condition just the way it is now how would you feel about that? Unhappy             ROS:  ROS:  A complete review of systems was performed.  All systems are negative except for pertinent findings as noted.   ROS  Allergies  Allergen Reactions  . Carbidopa-Levodopa Other (See Comments)    hallunications  . Morphine Sulfate Nausea And Vomiting  . Sulfa Antibiotics Nausea And Vomiting  . Sulfacetamide Sodium Nausea And Vomiting    Outpatient Encounter Medications as of 04/29/2020  Medication Sig  . allopurinol (ZYLOPRIM) 300 MG tablet Take 1 tablet by mouth once daily  . amLODipine (NORVASC) 10 MG tablet Take 1 tablet (10 mg total) by mouth daily.  . carvedilol (COREG) 25 MG tablet Take 1 tablet by mouth twice daily  . DULoxetine  (CYMBALTA) 60 MG capsule Take 1 capsule by mouth once daily  . enzalutamide (XTANDI) 40 MG tablet Take 3 tablets (120 mg total) by mouth daily.  [name] losartan (COZAAR) 50 MG tablet Take 1 tablet by mouth once daily  . mirabegron ER (MYRBETRIQ) 25 MG TB24 tablet Take 1 tablet (25 mg total) by mouth daily.  [name] nystatin (MYCOSTATIN/NYSTOP) powder Apply 1 application topically 3 (three) times daily.  . pantoprazole (PROTONIX) 40 MG tablet Take 40 mg by mouth 2 (two) times daily.  . pregabalin (LYRICA) 25 MG capsule Take 1 capsule by mouth twice daily  . traMADol (ULTRAM) 50 MG tablet Take 1 tablet (50 mg total) by mouth daily as needed.  . [DISCONTINUED] tamsulosin (FLOMAX) 0.4 MG CAPS capsule Take 1 capsule (0.4 mg total) by mouth daily.  . [DISCONTINUED] Vibegron (GEMTESA) 75 MG TABS Take 75 mg by mouth daily.  . rosuvastatin (CRESTOR) 20 MG tablet Take 1 tablet (20 mg total) by mouth daily.  . tamsulosin (FLOMAX) 0.4 MG CAPS capsule Take 1 capsule (0.4 mg total) by mouth daily.  . [DISCONTINUED] allopurinol (ZYLOPRIM) 300 MG tablet Take 1 tablet (300 mg total) by mouth daily.  . [DISCONTINUED] amLODipine (NORVASC) 10 MG tablet Take 1 tablet (10 mg total) by mouth daily. TAKE 1 TABLET BY MOUTH ONCE DAILY (NEEDS  TO  BE  SEEN  BEFORE  NEXT  REFILL)  . [DISCONTINUED] carvedilol (COREG) 25 MG tablet Take 1 tablet (25 mg total) by mouth 2 (two) times daily.  . [DISCONTINUED] DULoxetine (CYMBALTA) 60 MG capsule Take 1 capsule by mouth once daily  . [DISCONTINUED] enzalutamide (XTANDI) 40 MG tablet Take 4 tablets (160 mg total) by mouth daily.  . [DISCONTINUED] losartan (COZAAR) 50 MG tablet Take 1 tablet (50 mg total) by mouth daily.  . [DISCONTINUED] pregabalin (LYRICA) 25 MG capsule Take 1 capsule (25 mg total) by mouth 2 (two) times daily.  . [EXPIRED] Leuprolide Acetate (6 Month) (LUPRON) injection 45 mg    No facility-administered encounter medications on file as of 04/29/2020.    Past Medical  History:  Diagnosis Date  . Aortic atherosclerosis ([place])    2004  s/p  closure Penetrating atherosclerotic ulcer of infarenal aorta w/ endovascular stent graft  . Arthritis   . CKD (chronic kidney disease), stage III ([place])   .  Closed fracture of head of humerus 07/2017   right shoulder from fall; 09-25-2017 per pt no surgical intervention, only wore sling, intermittant pain  . Coronary atherosclerosis of native coronary artery    positive myoview for ischemia 09-27-2009;  10-01-2009 per cardiac cath-- minimal nonobstructive CAD w/ 30% LAD   . ED (erectile dysfunction)   . Emphysema/COPD [facility])    followed by pcp--- last exacerbation 09-12-2017;  09-25-2017 per pt no cough, sob or congestion  . Essential hypertension   . Family history of kidney cancer   . First degree heart block   . Frequency of urination   . GERD (gastroesophageal reflux disease)   . Gout    09-25-2017  per pt stable,  last episode 2015  . Heart murmur   . History of adenomatous polyp of colon   . History of closed head injury 2005   per pt residual resolved  . History of external beam radiation therapy    completed 10/ 2015 for prostate cancer  . History of gastric ulcer 2014  . History of kidney stones   . History of pneumothorax    spontaneous pneumo treated w/ chest tube  . History of rib fracture 07/2017   from fall,  right side 6th,7th,8th  . Neuropathy   . OAB (overactive bladder)   . OSA (obstructive sleep apnea)    Intolerant of CPAP  . Prostate cancer ([place])    dx 09-26-2011 via bx-- Stage T3b,N0,  Gleason 4+4, PSA 11.2 with METs to external iliac lymph node(resolved with ADT)-- treated w/ ADT ;   05/2013 staging work-up for rising PSA , started casodex and completed radiation therapy 10/ 2015;   rising PSA post treatment  . RLS (restless legs syndrome)   . Ureteral neocystostomy bleed    left side  . Urge urinary incontinence     Past Surgical History:  Procedure Laterality Date  . ABDOMINAL  AORTIC ENDOVASCULAR STENT GRAFT  09-13-2007    dr [name]  [facility]   closure penetrating atherosclerotic ulcer of infarenal aorta with stent graft  . BIOPSY  03/06/2019   Procedure: BIOPSY;  Surgeon: [name], MD;  [facility];  Service: Endoscopy;;  gastric  . CARDIAC CATHETERIZATION  2005   per pt normal (done in [place])  . CARDIAC CATHETERIZATION  10/06/2009    dr cooper   minimal nonobstructive CAD w/30% LAD otherwise normal coronaries, LVEDP 70mmHg  . CARDIOVASCULAR STRESS TEST  09/27/2009    dr Aundra Dubin   lexiscan nuclear study w/ moderate reversible inferior perfusion defect ischemia,  ef 60% (cardiac cath scheduled)  . CHEST TUBE INSERTION  1989   "collapsed lung"due to injury  . CIRCUMCISION  09/26/2011   Procedure: CIRCUMCISION ADULT;  Surgeon: [name], MD;  Location: [facility];  Service: Urology;  Laterality: N/A;  . COLONOSCOPY W/ POLYPECTOMY    . COLONOSCOPY WITH PROPOFOL N/A 04/27/2016    eight 4-8 mm polyps in descending colon, at splenic flexure, in ascending colon and cecum. Tubular adenomas. Surveillance in 3 years.  . CYSTOSCOPY  09/26/2011   Procedure: CYSTOSCOPY;  Surgeon: [name], MD;  Location: [facility];  Service: Urology;  Laterality: N/A;  . CYSTOSCOPY/RETROGRADE/URETEROSCOPY Left 09/27/2017   Procedure: CYSTOSCOPYLEFT Marlow Baars AND RENAL WASHINGS;  Surgeon: [name], MD;  Location: [facility];  Service: Urology;  Laterality: Left;  . ESOPHAGOGASTRODUODENOSCOPY (EGD) WITH PROPOFOL N/A 04/27/2016   normal esophagus s/p dilation, small hiatal hernia, normal duodenum  .  ESOPHAGOGASTRODUODENOSCOPY (EGD) WITH PROPOFOL N/A 03/06/2019   erosive reflux esophagitis, s/p dilation, normal duodenum, abnormal gastric mucosa s/p biopsy. Hyperplastic gastric polyp. No H.pylori.   [name] EXTRACORPOREAL SHOCK WAVE LITHOTRIPSY  yrs ago  . FRACTURE SURGERY  child   Bilateral lower arms   . KNEE ARTHROSCOPY Right 2013  . MALONEY DILATION  N/A 04/27/2016   Procedure: Venia Minks DILATION;  Surgeon: [name], MD;  [facility];  Service: Endoscopy;  Laterality: N/A;  . Venia Minks DILATION N/A 03/06/2019   Procedure: Venia Minks DILATION;  Surgeon: [name], MD;  [facility];  Service: Endoscopy;  Laterality: N/A;  . PARTIAL KNEE ARTHROPLASTY Right 04/26/2015   Procedure: RIGHT KNEE MEDIAL UNICOMPARTMENTAL ARTHROPLASTY;  Surgeon: Gaynelle Arabian, MD;  [facility];  Service: Orthopedics;  Laterality: Right;  . POLYPECTOMY  04/27/2016   Procedure: POLYPECTOMY;  Surgeon: [name], MD;  [facility];  Service: Endoscopy;;  cecal , ascending, descending, and sigmoid polypectomies  . PROSTATE BIOPSY  09/26/2011   Procedure: BIOPSY TRANSRECTAL ULTRASONIC PROSTATE (TUBP);  Surgeon: [name], MD;  [facility];  Service: Urology;  Laterality: N/A;     . TRANSTHORACIC ECHOCARDIOGRAM  01-07-2013   dr [name]   ef 60-65%,  grade 1 diastolic dysfunction/  mild AR without stenosis/  mild LAE/  mild to moderate calcificed MV annulus without regurg. or stenosis/  . UMBILICAL HERNIA REPAIR  yrs ago    Social History   Socioeconomic History  . Marital status: Married    Spouse name: Not on file  . Number of children: 3  . Years of education: 74  . Highest education level: Some college, no degree  Occupational History  . Occupation: Retired    Comment: Hotel manager    Comment: Part Time at [employer]  . Smoking status: Current Every Day Smoker    Packs/day: 0.50    Years: 42.00    Pack years: 21.00    Types: Cigarettes  . Smokeless tobacco: Never Used  Vaping Use  . Vaping Use: Never used  Substance and Sexual Activity  . Alcohol use: No    Alcohol/week: 0.0 standard drinks  . Drug use: No  . Sexual activity: Yes    Birth control/protection: None  Other Topics Concern  . Not on file  Social History Narrative   Patient is retired from the department of [employer].  He lives in a one story home with his wife. He moved from [facility] about 9 years ago. They do not have family on the [place] and although he is very outgoing he feels socially isolated. He isn't a member of any groups or churches.    Social Determinants of Health   Financial Resource Strain: High Risk  . Difficulty of Paying Living Expenses: Hard  Food Insecurity: Not on file  Transportation Needs: No Transportation Needs  . Lack of Transportation (Medical): No  . Lack of Transportation (Non-Medical): No  Physical Activity: Not on file  Stress: Not on file  Social Connections: Not on file  Intimate Partner Violence: Not At Risk  . Fear of Current or Ex-Partner: No  . Emotionally Abused: No  . Physically Abused: No  . Sexually Abused: No    Family History  Problem Relation Age of Onset  . Stroke Mother   . Alzheimer's disease Mother 37       probably due to head injury  . Mesothelioma Father 11  .  Kidney cancer Father   . Hyperlipidemia Sister   . Heart attack Sister   . Kidney cancer Sister        dx in her 67s  . Heart disease Brother   . Kidney cancer Brother 79       dx in his 6s  . Hyperlipidemia Brother   . Kidney cancer Brother        dx in his 59s  . Healthy Daughter   . Healthy Son   . Healthy Son   . Colon cancer Neg Hx        Objective: Vitals:   04/29/20 1047  BP: 133/85  Pulse: 79  Temp: 97.7 F (36.5 C)     Physical Exam  Lab Results:  Recent Results (from the past 2160 hour(s))  CBC with Differential/Platelet     Status: Abnormal   Collection Time: 02/11/20  2:47 PM  Result Value Ref Range   WBC 6.3 4.0 - 10.5 K/uL   RBC 3.81 (L) 4.22 - 5.81 MIL/uL   Hemoglobin 11.3 (L) 13.0 - 17.0 g/dL   HCT 35.1 (L) 39.0 - 52.0 %   MCV 92.1 80.0 - 100.0 fL   MCH 29.7 26.0 - 34.0 pg   MCHC 32.2 30.0 - 36.0 g/dL   RDW 15.0 11.5 - 15.5 %   Platelets 208 150 - 400 K/uL   nRBC 0.0 0.0 - 0.2 %   Neutrophils Relative % 76 %   Neutro Abs 4.8 1.7 -  7.7 K/uL   Lymphocytes Relative 12 %   Lymphs Abs 0.8 0.7 - 4.0 K/uL   Monocytes Relative 6 %   Monocytes Absolute 0.4 0.1 - 1.0 K/uL   Eosinophils Relative 5 %   Eosinophils Absolute 0.3 0.0 - 0.5 K/uL   Basophils Relative 1 %   Basophils Absolute 0.0 0.0 - 0.1 K/uL   Immature Granulocytes 0 %   Abs Immature Granulocytes 0.02 0.00 - 0.07 K/uL    Comment: Performed at [facility], [address], [place], [place] [address]  Comprehensive metabolic panel     Status: Abnormal   Collection Time: 02/11/20  2:47 PM  Result Value Ref Range   Sodium 137 135 - 145 mmol/L   Potassium 3.5 3.5 - 5.1 mmol/L   Chloride 105 98 - 111 mmol/L   CO2 23 22 - 32 mmol/L   Glucose, Bld 110 (H) 70 - 99 mg/dL    Comment: Glucose reference range applies only to samples taken after fasting for at least 8 hours.   BUN 23 8 - 23 mg/dL   Creatinine, Ser 2.22 (H) 0.61 - 1.24 mg/dL   Calcium 8.2 (L) 8.9 - 10.3 mg/dL   Total Protein 7.1 6.5 - 8.1 g/dL   Albumin 3.7 3.5 - 5.0 g/dL   AST 13 (L) 15 - 41 U/L   ALT 12 0 - 44 U/L   Alkaline Phosphatase 64 38 - 126 U/L   Total Bilirubin 0.6 0.3 - 1.2 mg/dL   GFR, Estimated 30 (L) >60 mL/min    Comment: (NOTE) Calculated using the CKD-EPI Creatinine Equation (2021)    Anion gap 9 5 - 15    Comment: Performed at [facility], [address], [place], [place] [address]  CBC with Differential     Status: Abnormal   Collection Time: 04/14/20  2:08 PM  Result Value Ref Range   WBC 5.8 4.0 - 10.5 K/uL   RBC 3.74 (L) 4.22 - 5.81 MIL/uL  Hemoglobin 11.3 (L) 13.0 - 17.0 g/dL   HCT 34.5 (L) 39.0 - 52.0 %   MCV 92.2 80.0 - 100.0 fL   MCH 30.2 26.0 - 34.0 pg   MCHC 32.8 30.0 - 36.0 g/dL   RDW 15.1 11.5 - 15.5 %   Platelets 188 150 - 400 K/uL   nRBC 0.0 0.0 - 0.2 %   Neutrophils Relative % 75 %   Neutro Abs 4.4 1.7 - 7.7 K/uL   Lymphocytes Relative 11 %   Lymphs Abs 0.6 (L) 0.7 - 4.0 K/uL   Monocytes Relative 7 %   Monocytes Absolute 0.4 0.1 - 1.0 K/uL    Eosinophils Relative 6 %   Eosinophils Absolute 0.3 0.0 - 0.5 K/uL   Basophils Relative 1 %   Basophils Absolute 0.0 0.0 - 0.1 K/uL   Immature Granulocytes 0 %   Abs Immature Granulocytes 0.02 0.00 - 0.07 K/uL    Comment: Performed at [facility], [address] [place], [place] [address]  Comprehensive metabolic panel     Status: Abnormal   Collection Time: 04/14/20  2:08 PM  Result Value Ref Range   Sodium 137 135 - 145 mmol/L   Potassium 3.5 3.5 - 5.1 mmol/L   Chloride 104 98 - 111 mmol/L   CO2 26 22 - 32 mmol/L   Glucose, Bld 107 (H) 70 - 99 mg/dL    Comment: Glucose reference range applies only to samples taken after fasting for at least 8 hours.   BUN 26 (H) 8 - 23 mg/dL   Creatinine, Ser 2.20 (H) 0.61 - 1.24 mg/dL   Calcium 8.4 (L) 8.9 - 10.3 mg/dL   Total Protein 6.8 6.5 - 8.1 g/dL   Albumin 3.4 (L) 3.5 - 5.0 g/dL   AST 12 (L) 15 - 41 U/L   ALT 10 0 - 44 U/L   Alkaline Phosphatase 57 38 - 126 U/L   Total Bilirubin 0.5 0.3 - 1.2 mg/dL   GFR, Estimated 30 (L) >60 mL/min    Comment: (NOTE) Calculated using the CKD-EPI Creatinine Equation (2021)    Anion gap 7 5 - 15    Comment: Performed at [facility], [address] [place], [place] [address]  PSA     Status: None   Collection Time: 04/14/20  2:08 PM  Result Value Ref Range   Prostatic Specific Antigen 0.30 0.00 - 4.00 ng/mL    Comment: (NOTE) While PSA levels of <=4.0 ng/ml are reported as reference range, some men with levels below 4.0 ng/ml can have prostate cancer and many men with PSA above 4.0 ng/ml do not have prostate cancer.  Other tests such as free PSA, age specific reference ranges, PSA velocity and PSA doubling time may be helpful especially in men less than 56 years old. Performed at [facility], [place] [address], [place], [place] [address]   Urinalysis, Routine w reflex microscopic     Status: Abnormal   Collection Time: 04/29/20 10:47 AM  Result Value Ref Range   Specific Gravity, UA 1.020  1.005 - 1.030   pH, UA 6.0 5.0 - 7.5   Color, UA Yellow Yellow   Appearance Ur Clear Clear   Leukocytes,UA Negative Negative   Protein,UA 3+ (A) Negative/Trace   Glucose, UA Trace (A) Negative   Ketones, UA Negative Negative   RBC, UA 1+ (A) Negative   Bilirubin, UA Negative Negative   Urobilinogen, Ur 0.2 0.2 - 1.0 mg/dL   Nitrite, UA Negative  Negative   Microscopic Examination See below:   Microscopic Examination     Status: Abnormal   Collection Time: 04/29/20 10:47 AM   Urine  Result Value Ref Range   WBC, UA None seen 0 - 5 /hpf   RBC 3-10 (A) 0 - 2 /hpf   Epithelial Cells (non renal) None seen 0 - 10 /hpf   Renal Epithel, UA None seen None seen /hpf   Mucus, UA Present Not Estab.   Bacteria, UA Few None seen/Few       Studies/Results: Axumin PET was done on 12/18/19.  IMPRESSION: 1. Persistent hypermetabolism involving the prostate in the setting of prior radiation therapy. Slightly increased on the left today, highly suspicious for residual disease. 2. Similar left and slight increase in right inguinal nodal low-level hypermetabolism, favored to be reactive but warranting follow-up attention. 3. Increased hypermetabolism within enlarging thoracic nodes. Cannot exclude an atypical distribution of nodal metastasis. This warrants follow-up attention with either PET or chest CT at 6 months. 4. Incidental findings, including: Aortic atherosclerosis (ICD10-I70.0), coronary artery atherosclerosis and emphysema (ICD10-J43.9). Right femoral head avascular necrosis.  Assessment & Plan: [place] Resistant metastatic prostate cancer with a good PSA response to Xtandi.    He will return in 3 months for Lupron.  Nocturia and Urge incontinence.   He didn't respond to the [place] and is out of that.   Continue tamsulosin.  I will have him try Myrbetriq 25mg  again and recommended he use Miralax if gets constipated.  He has CKD and will not be able to go to the 50mg   dose.  Anxiety.  He is dealing with a fear of death and I encouraged him to reach out to Dr. Warrick Parisian to see if a psychology referral could be arranged.       Meds ordered this encounter  Medications  . tamsulosin (FLOMAX) 0.4 MG CAPS capsule    Sig: Take 1 capsule (0.4 mg total) by mouth daily.    Dispense:  90 capsule    Refill:  3  . mirabegron ER (MYRBETRIQ) 25 MG TB24 tablet    Sig: Take 1 tablet (25 mg total) by mouth daily.    Dispense:  28 tablet    Refill:  0    Samples given     Orders Placed This Encounter  Procedures  . Microscopic Examination  . Urinalysis, Routine w reflex microscopic      Return in about 3 months (around 07/28/2020) for Lupron on return. .   CC: [name], [name], MD      [name] 04/29/2020
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
## 2020-04-28 ENCOUNTER — Encounter (HOSPITAL_COMMUNITY): Payer: Self-pay | Admitting: Physical Therapy

## 2020-04-28 ENCOUNTER — Ambulatory Visit (HOSPITAL_COMMUNITY): Payer: Medicare Other | Admitting: Physical Therapy

## 2020-04-28 DIAGNOSIS — R262 Difficulty in walking, not elsewhere classified: Secondary | ICD-10-CM

## 2020-04-28 DIAGNOSIS — R42 Dizziness and giddiness: Secondary | ICD-10-CM | POA: Diagnosis not present

## 2020-04-28 NOTE — Therapy (Signed)
[facility] [address] [place], [place], [address] Phone: [phone]   Fax:  [phone]  Physical Therapy Treatment  Patient Details  Name: [name] MRN: [id number] Date of Birth: [birth year] Referring Provider (PT): Caryl Pina MD   Encounter Date: 04/28/2020   PT End of Session - 04/28/20 1315    Visit Number 2    Number of Visits 8    Date for PT Re-Evaluation 05/28/20    Authorization Type Medicare A    PT Start Time 1320    PT Stop Time 1400    PT Time Calculation (min) 40 min    Equipment Utilized During Treatment Gait belt    Activity Tolerance Patient tolerated treatment well    Behavior During Therapy [facility] for tasks assessed/performed           Past Medical History:  Diagnosis Date  . Aortic atherosclerosis ([place])    2004  s/p  closure Penetrating atherosclerotic ulcer of infarenal aorta w/ endovascular stent graft  . Arthritis   . CKD (chronic kidney disease), stage III ([place])   . Closed fracture of head of humerus 07/2017   right shoulder from fall; 09-25-2017 per pt no surgical intervention, only wore sling, intermittant pain  . Coronary atherosclerosis of native coronary artery    positive myoview for ischemia 09-27-2009;  10-01-2009 per cardiac cath-- minimal nonobstructive CAD w/ 30% LAD   . ED (erectile dysfunction)   . Emphysema/COPD [facility])    followed by pcp--- last exacerbation 09-12-2017;  09-25-2017 per pt no cough, sob or congestion  . Essential hypertension   . Family history of kidney cancer   . First degree heart block   . Frequency of urination   . GERD (gastroesophageal reflux disease)   . Gout    09-25-2017  per pt stable,  last episode 2015  . Heart murmur   . History of adenomatous polyp of colon   . History of closed head injury 2005   per pt residual resolved  . History of external beam radiation therapy    completed 10/ 2015 for prostate cancer  . History of gastric ulcer  2014  . History of kidney stones   . History of pneumothorax    spontaneous pneumo treated w/ chest tube  . History of rib fracture 07/2017   from fall,  right side 6th,7th,8th  . Neuropathy   . OAB (overactive bladder)   . OSA (obstructive sleep apnea)    Intolerant of CPAP  . Prostate cancer ([place])    dx 09-26-2011 via bx-- Stage T3b,N0,  Gleason 4+4, PSA 11.2 with METs to external iliac lymph node(resolved with ADT)-- treated w/ ADT ;   05/2013 staging work-up for rising PSA , started casodex and completed radiation therapy 10/ 2015;   rising PSA post treatment  . RLS (restless legs syndrome)   . Ureteral neocystostomy bleed    left side  . Urge urinary incontinence     Past Surgical History:  Procedure Laterality Date  . ABDOMINAL AORTIC ENDOVASCULAR STENT GRAFT  09-13-2007    dr [name]  [facility]   closure penetrating atherosclerotic ulcer of infarenal aorta with stent graft  . BIOPSY  03/06/2019   Procedure: BIOPSY;  Surgeon: [name], MD;  [facility];  Service: Endoscopy;;  gastric  . CARDIAC CATHETERIZATION  2005   per pt normal (done in [place])  . CARDIAC CATHETERIZATION  10/06/2009    dr Burt Knack  minimal nonobstructive CAD w/30% LAD otherwise normal coronaries, LVEDP 53mmHg  . CARDIOVASCULAR STRESS TEST  09/27/2009    dr Aundra Dubin   lexiscan nuclear study w/ moderate reversible inferior perfusion defect ischemia,  ef 60% (cardiac cath scheduled)  . CHEST TUBE INSERTION  1989   "collapsed lung"due to injury  . CIRCUMCISION  09/26/2011   Procedure: CIRCUMCISION ADULT;  Surgeon: [name], MD;  Location: [facility];  Service: Urology;  Laterality: N/A;  . COLONOSCOPY W/ POLYPECTOMY    . COLONOSCOPY WITH PROPOFOL N/A 04/27/2016    eight 4-8 mm polyps in descending colon, at splenic flexure, in ascending colon and cecum. Tubular adenomas. Surveillance in 3 years.  . CYSTOSCOPY  09/26/2011   Procedure: CYSTOSCOPY;  Surgeon: [name], MD;  Location: [facility];   Service: Urology;  Laterality: N/A;  . CYSTOSCOPY/RETROGRADE/URETEROSCOPY Left 09/27/2017   Procedure: CYSTOSCOPYLEFT Marlow Baars AND RENAL WASHINGS;  Surgeon: [name], MD;  Location: [facility];  Service: Urology;  Laterality: Left;  . ESOPHAGOGASTRODUODENOSCOPY (EGD) WITH PROPOFOL N/A 04/27/2016   normal esophagus s/p dilation, small hiatal hernia, normal duodenum  . ESOPHAGOGASTRODUODENOSCOPY (EGD) WITH PROPOFOL N/A 03/06/2019   erosive reflux esophagitis, s/p dilation, normal duodenum, abnormal gastric mucosa s/p biopsy. Hyperplastic gastric polyp. No H.pylori.   [name] EXTRACORPOREAL SHOCK WAVE LITHOTRIPSY  yrs ago  . FRACTURE SURGERY  child   Bilateral lower arms   . KNEE ARTHROSCOPY Right 2013  . MALONEY DILATION N/A 04/27/2016   Procedure: Venia Minks DILATION;  Surgeon: [name], MD;  [facility];  Service: Endoscopy;  Laterality: N/A;  . Venia Minks DILATION N/A 03/06/2019   Procedure: Venia Minks DILATION;  Surgeon: [name], MD;  [facility];  Service: Endoscopy;  Laterality: N/A;  . PARTIAL KNEE ARTHROPLASTY Right 04/26/2015   Procedure: RIGHT KNEE MEDIAL UNICOMPARTMENTAL ARTHROPLASTY;  Surgeon: Gaynelle Arabian, MD;  Location: [facility];  Service: Orthopedics;  Laterality: Right;  . POLYPECTOMY  04/27/2016   Procedure: POLYPECTOMY;  Surgeon: [name], MD;  [facility];  Service: Endoscopy;;  cecal , ascending, descending, and sigmoid polypectomies  . PROSTATE BIOPSY  09/26/2011   Procedure: BIOPSY TRANSRECTAL ULTRASONIC PROSTATE (TUBP);  Surgeon: [name], MD;  [facility]: [facility];  Service: Urology;  Laterality: N/A;     . TRANSTHORACIC ECHOCARDIOGRAM  01-07-2013   dr [name]   ef 60-65%,  grade 1 diastolic dysfunction/  mild AR without stenosis/  mild LAE/  mild to moderate calcificed MV annulus without regurg. or stenosis/  . UMBILICAL HERNIA REPAIR  yrs ago    There were no vitals filed for this visit.    Subjective Assessment - 04/28/20 1318    Subjective Pt comes to the department stating that he has been doing his exercises; states that his sugar is low; therapist went and got the pt some candy.    Limitations Standing;Walking    Patient Stated Goals Get my balance back, get my legs to work better    Currently in Pain? No/denies              [facility] PT Assessment - 04/28/20 0001      Standardized Balance Assessment   Standardized Balance Assessment Berg Balance Test;Timed Up and Go Test      Berg Balance Test   Sit to Stand Able to stand without using hands and stabilize independently    Standing Unsupported Able to stand safely 2 minutes    Sitting with Back Unsupported  but Feet Supported on Floor or Stool Able to sit safely and securely 2 minutes    Stand to Sit Controls descent by using hands    Transfers Able to transfer safely, definite need of hands    Standing Unsupported with Eyes Closed Able to stand 10 seconds with supervision    Standing Unsupported with Feet Together Able to place feet together independently and stand for 1 minute with supervision    From Standing, Reach Forward with Outstretched Arm Can reach forward >12 cm safely (5")    From Standing Position, Pick up Object from [place] to pick up shoe safely and easily    From Standing Position, Turn to Look Behind Over each Shoulder Turn sideways only but maintains balance    Turn 360 Degrees Able to turn 360 degrees safely in 4 seconds or less    Standing Unsupported, Alternately Place Feet on Step/Stool Needs assistance to keep from falling or unable to try    Standing Unsupported, One Foot in [place] to take small step independently and hold 30 seconds    Standing on One Leg Tries to lift leg/unable to hold 3 seconds but remains standing independently    Total Score 40      Timed Up and Go Test   Normal TUG (seconds) 13.4                              Balance Exercises - 04/28/20  0001      Balance Exercises: Standing   Standing Eyes Opened Narrow base of support (BOS)   with head turns tends to go to the left   SLS Eyes open   with one finger hold   Marching Static;10 reps    Heel Raises 10 reps   with head turns   Sit to Stand Standard surface   10              PT Short Term Goals - 04/28/20 1326      PT SHORT TERM GOAL #1   Title Patient will be independent with initial HEP and self-management strategies to improve functional outcomes    Time 2    Period Weeks    Status On-going    Target Date 05/14/20      PT SHORT TERM GOAL #2   Title Patient with have 1/2 grade improvement in bilatearl hip abductor strength to improve functional statbility with singel limb stance activitites.    Status On-going      PT SHORT TERM GOAL #3   Title .             PT Long Term Goals - 04/28/20 1327      PT LONG TERM GOAL #1   Title Patient will improve FOTO score by 5 to indicate improvement in functional outcomes    Time 4    Period Weeks    Status On-going      PT LONG TERM GOAL #2   Title Patient will reports no falls since starting therapy for reduced risk of injury and improved functional outcomes    Time 4    Period Weeks    Status On-going      PT LONG TERM GOAL #3   Title Patient will report at least 75% improvement in ability to ambulate and navigate home and external environment of level surfaces for reduced risk of falls and improved funcitonal mobility    Time 4  Period Weeks    Status On-going      PT LONG TERM GOAL #4   Status On-going                 Plan - 04/28/20 1316    Clinical Impression Statement Evaluation and goals reviewed with pt.  Therapist performed Merrilee Jansky and Tug test for balance. Began balance activity witn min assist needed as pt tends to lose his balance to the left.    Personal Factors and Comorbidities Time since onset of injury/illness/exacerbation    Examination-Activity Limitations  Stairs;Stand;Transfers;Bend    Examination-Participation Restrictions Community Activity;Yard Work;Other    Stability/Clinical Decision Making Stable/Uncomplicated    Rehab Potential Good    PT Frequency 2x / week    PT Duration 4 weeks    PT Treatment/Interventions ADLs/Self Care Home Management;Aquatic Therapy;Biofeedback;Canalith Repostioning;Cryotherapy;Electrical Stimulation;Iontophoresis 4mg /ml Dexamethasone;Moist Heat;Traction;Balance training;Manual techniques;Therapeutic exercise;Therapeutic activities;Functional mobility training;Splinting;Taping;Vasopneumatic Device;Vestibular;Energy conservation;Dry needling;Patient/family education;Orthotic Fit/Training;Passive range of motion;Spinal Manipulations;Joint Manipulations;Stair training;Gait training;DME Instruction;Contrast Bath;Neuromuscular re-education;Ultrasound;Parrafin;Fluidtherapy;Compression bandaging;Visual/perceptual remediation/compensation;Scar mobilization    PT Next Visit Plan .  Progress vistibular exercise, habituation and balance activities as tolerated.    PT Home Exercise Plan Eval: saccades; 1/26: sit to stand, heel raises, single leg stance and narrow base of support with head turns    Consulted and Agree with Plan of Care Patient           Patient will benefit from skilled therapeutic intervention in order to improve the following deficits and impairments:  Abnormal gait,Dizziness,Impaired perceived functional ability,Decreased balance,Decreased strength  Visit Diagnosis: Difficulty in walking, not elsewhere classified  Dizziness and giddiness     Problem List Patient Active Problem List   Diagnosis Date Noted  . Genetic testing 01/19/2020  . 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 Family history of kidney cancer   . Constipation 03/04/2019  . CKD (chronic kidney disease), stage III ([place]) 07/10/2018  . Depression, recurrent ([place]) 04/08/2018  . Dysphagia 07/10/2016  . Neuropathy 03/17/2015   . Prostate cancer ([place]) 06/30/2013  . Abdominal aneurysm without mention of rupture 02/19/2013  . Morbid obesity ([place]) 01/14/2013  . VITAMIN B12 DEFICIENCY 01/14/2013  . TOBACCO ABUSE 01/14/2013  . PTSD 01/14/2013  . COPD (chronic obstructive pulmonary disease) ([place])   . OSA (obstructive sleep apnea) 08/14/2012  . RLS (restless legs syndrome) 08/14/2012  . Prediabetes 08/14/2012  . Aortic atherosclerosis ([place]) 02/19/2012  . Coronary atherosclerosis of native coronary artery 03/23/2010  . GERD 07/30/2008  . History of colonic polyps 07/30/2008  . Hyperlipidemia 01/31/2007  . GOUT 01/31/2007  . Essential hypertension, benign 01/31/2007   Rayetta Humphrey, PT CLT [phone] 04/28/2020, 2:09 PM  [place], [place], [address] Phone: [phone]   Fax:  [phone]  Name: [name] MRN: [id number] Date of Birth: Jul 20, 1945

## 2020-04-29 ENCOUNTER — Encounter: Payer: Self-pay | Admitting: Urology

## 2020-04-29 ENCOUNTER — Other Ambulatory Visit: Payer: Self-pay

## 2020-04-29 ENCOUNTER — Ambulatory Visit (INDEPENDENT_AMBULATORY_CARE_PROVIDER_SITE_OTHER): Payer: Medicare Other | Admitting: Urology

## 2020-04-29 VITALS — BP 133/85 | HR 79 | Temp 97.7°F | Ht 68.0 in | Wt 282.0 lb

## 2020-04-29 DIAGNOSIS — C61 Malignant neoplasm of prostate: Secondary | ICD-10-CM

## 2020-04-29 DIAGNOSIS — R9721 Rising PSA following treatment for malignant neoplasm of prostate: Secondary | ICD-10-CM

## 2020-04-29 DIAGNOSIS — N3941 Urge incontinence: Secondary | ICD-10-CM | POA: Diagnosis not present

## 2020-04-29 DIAGNOSIS — C775 Secondary and unspecified malignant neoplasm of intrapelvic lymph nodes: Secondary | ICD-10-CM | POA: Diagnosis not present

## 2020-04-29 DIAGNOSIS — R3129 Other microscopic hematuria: Secondary | ICD-10-CM | POA: Diagnosis not present

## 2020-04-29 DIAGNOSIS — R351 Nocturia: Secondary | ICD-10-CM | POA: Diagnosis not present

## 2020-04-29 DIAGNOSIS — N3281 Overactive bladder: Secondary | ICD-10-CM | POA: Diagnosis not present

## 2020-04-29 LAB — URINALYSIS, ROUTINE W REFLEX MICROSCOPIC
Bilirubin, UA: NEGATIVE
Ketones, UA: NEGATIVE
Leukocytes,UA: NEGATIVE
Nitrite, UA: NEGATIVE
Specific Gravity, UA: 1.02 (ref 1.005–1.030)
Urobilinogen, Ur: 0.2 mg/dL (ref 0.2–1.0)
pH, UA: 6 (ref 5.0–7.5)

## 2020-04-29 LAB — MICROSCOPIC EXAMINATION
Epithelial Cells (non renal): NONE SEEN /hpf (ref 0–10)
Renal Epithel, UA: NONE SEEN /hpf
WBC, UA: NONE SEEN /hpf (ref 0–5)

## 2020-04-29 MED ORDER — TAMSULOSIN HCL 0.4 MG PO CAPS: 0.4000 mg | ORAL_CAPSULE | Freq: Every day | ORAL | 3 refills | Status: DC

## 2020-04-29 MED ORDER — MIRABEGRON ER 25 MG PO TB24: 25.0000 mg | ORAL_TABLET | Freq: Every day | ORAL | 0 refills | Status: DC

## 2020-04-29 NOTE — Progress Notes (Signed)
Urological Symptom Review  Patient is experiencing the following symptoms: Frequent urination Hard to postpone urination Get up at night to urinate Leakage of urine   Review of Systems  Gastrointestinal (upper)  : Negative for upper GI symptoms  Gastrointestinal (lower) : Negative for lower GI symptoms  Constitutional : Negative for symptoms  Skin: Negative for skin symptoms  Eyes: Negative for eye symptoms  Ear/Nose/Throat : Negative for Ear/Nose/Throat symptoms  Hematologic/Lymphatic: Negative for Hematologic/Lymphatic symptoms  Cardiovascular : Negative for cardiovascular symptoms  Respiratory : Negative for respiratory symptoms  Endocrine: Negative for endocrine symptoms  Musculoskeletal: Negative for musculoskeletal symptoms  Neurological: Negative for neurological symptoms  Psychologic: Depression

## 2020-05-04 ENCOUNTER — Other Ambulatory Visit (HOSPITAL_COMMUNITY): Payer: Self-pay | Admitting: Hematology

## 2020-05-05 ENCOUNTER — Other Ambulatory Visit: Payer: Self-pay

## 2020-05-05 ENCOUNTER — Ambulatory Visit (HOSPITAL_COMMUNITY): Payer: Medicare Other | Attending: Family Medicine | Admitting: Physical Therapy

## 2020-05-05 ENCOUNTER — Encounter (HOSPITAL_COMMUNITY): Payer: Self-pay | Admitting: Physical Therapy

## 2020-05-05 DIAGNOSIS — R262 Difficulty in walking, not elsewhere classified: Secondary | ICD-10-CM | POA: Insufficient documentation

## 2020-05-05 DIAGNOSIS — R42 Dizziness and giddiness: Secondary | ICD-10-CM | POA: Diagnosis not present

## 2020-05-05 NOTE — Therapy (Signed)
[facility] [address] [place], [place], [address] Phone: [phone]   Fax:  [phone]  Physical Therapy Treatment  Patient Details  Name: [name] MRN: [id number] Date of Birth: 10-18-1945 Referring Provider (PT): Caryl Pina MD   Encounter Date: 05/05/2020   PT End of Session - 05/05/20 1352    Visit Number 3    Number of Visits 8    Date for PT Re-Evaluation 05/28/20    Authorization Type Medicare A    PT Start Time 1347    PT Stop Time 1428    PT Time Calculation (min) 41 min    Equipment Utilized During Treatment Gait belt    Activity Tolerance Patient tolerated treatment well;Patient limited by fatigue    Behavior During Therapy [facility] for tasks assessed/performed           Past Medical History:  Diagnosis Date  . Aortic atherosclerosis ([place])    2004  s/p  closure Penetrating atherosclerotic ulcer of infarenal aorta w/ endovascular stent graft  . Arthritis   . CKD (chronic kidney disease), stage III ([place])   . Closed fracture of head of humerus 07/2017   right shoulder from fall; 09-25-2017 per pt no surgical intervention, only wore sling, intermittant pain  . Coronary atherosclerosis of native coronary artery    positive myoview for ischemia 09-27-2009;  10-01-2009 per cardiac cath-- minimal nonobstructive CAD w/ 30% LAD   . ED (erectile dysfunction)   . Emphysema/COPD [facility])    followed by pcp--- last exacerbation 09-12-2017;  09-25-2017 per pt no cough, sob or congestion  . Essential hypertension   . Family history of kidney cancer   . First degree heart block   . Frequency of urination   . GERD (gastroesophageal reflux disease)   . Gout    09-25-2017  per pt stable,  last episode 2015  . Heart murmur   . History of adenomatous polyp of colon   . History of closed head injury 2005   per pt residual resolved  . History of external beam radiation therapy    completed 10/ 2015 for prostate cancer  .  History of gastric ulcer 2014  . History of kidney stones   . History of pneumothorax    spontaneous pneumo treated w/ chest tube  . History of rib fracture 07/2017   from fall,  right side 6th,7th,8th  . Neuropathy   . OAB (overactive bladder)   . OSA (obstructive sleep apnea)    Intolerant of CPAP  . Prostate cancer ([place])    dx 09-26-2011 via bx-- Stage T3b,N0,  Gleason 4+4, PSA 11.2 with METs to external iliac lymph node(resolved with ADT)-- treated w/ ADT ;   05/2013 staging work-up for rising PSA , started casodex and completed radiation therapy 10/ 2015;   rising PSA post treatment  . RLS (restless legs syndrome)   . Ureteral neocystostomy bleed    left side  . Urge urinary incontinence     Past Surgical History:  Procedure Laterality Date  . ABDOMINAL AORTIC ENDOVASCULAR STENT GRAFT  09-13-2007    dr [name]  [facility]   closure penetrating atherosclerotic ulcer of infarenal aorta with stent graft  . BIOPSY  03/06/2019   Procedure: BIOPSY;  Surgeon: [name], MD;  [facility];  Service: Endoscopy;;  gastric  . CARDIAC CATHETERIZATION  2005   per pt normal (done in [place])  . CARDIAC CATHETERIZATION  10/06/2009    dr  cooper   minimal nonobstructive CAD w/30% LAD otherwise normal coronaries, LVEDP 89mmHg  . CARDIOVASCULAR STRESS TEST  09/27/2009    dr Aundra Dubin   lexiscan nuclear study w/ moderate reversible inferior perfusion defect ischemia,  ef 60% (cardiac cath scheduled)  . CHEST TUBE INSERTION  1989   "collapsed lung"due to injury  . CIRCUMCISION  09/26/2011   Procedure: CIRCUMCISION ADULT;  Surgeon: [name], MD;  [facility];  Service: Urology;  Laterality: N/A;  . COLONOSCOPY W/ POLYPECTOMY    . COLONOSCOPY WITH PROPOFOL N/A 04/27/2016    eight 4-8 mm polyps in descending colon, at splenic flexure, in ascending colon and cecum. Tubular adenomas. Surveillance in 3 years.  . CYSTOSCOPY  09/26/2011   Procedure: CYSTOSCOPY;  Surgeon: [name], MD;   [facility];  Service: Urology;  Laterality: N/A;  . CYSTOSCOPY/RETROGRADE/URETEROSCOPY Left 09/27/2017   Procedure: CYSTOSCOPYLEFT Marlow Baars AND RENAL WASHINGS;  Surgeon: [name], MD;  Location: [facility];  Service: Urology;  Laterality: Left;  . ESOPHAGOGASTRODUODENOSCOPY (EGD) WITH PROPOFOL N/A 04/27/2016   normal esophagus s/p dilation, small hiatal hernia, normal duodenum  . ESOPHAGOGASTRODUODENOSCOPY (EGD) WITH PROPOFOL N/A 03/06/2019   erosive reflux esophagitis, s/p dilation, normal duodenum, abnormal gastric mucosa s/p biopsy. Hyperplastic gastric polyp. No H.pylori.   [name] EXTRACORPOREAL SHOCK WAVE LITHOTRIPSY  yrs ago  . FRACTURE SURGERY  child   Bilateral lower arms   . KNEE ARTHROSCOPY Right 2013  . MALONEY DILATION N/A 04/27/2016   Procedure: Venia Minks DILATION;  Surgeon: [name], MD;  [facility];  Service: Endoscopy;  Laterality: N/A;  . Venia Minks DILATION N/A 03/06/2019   Procedure: Venia Minks DILATION;  Surgeon: [name], MD;  [facility];  Service: Endoscopy;  Laterality: N/A;  . PARTIAL KNEE ARTHROPLASTY Right 04/26/2015   Procedure: RIGHT KNEE MEDIAL UNICOMPARTMENTAL ARTHROPLASTY;  Surgeon: Gaynelle Arabian, MD;  [facility];  Service: Orthopedics;  Laterality: Right;  . POLYPECTOMY  04/27/2016   Procedure: POLYPECTOMY;  Surgeon: [name], MD;  [facility];  Service: Endoscopy;;  cecal , ascending, descending, and sigmoid polypectomies  . PROSTATE BIOPSY  09/26/2011   Procedure: BIOPSY TRANSRECTAL ULTRASONIC PROSTATE (TUBP);  Surgeon: [name], MD;  [facility];  Service: Urology;  Laterality: N/A;     . TRANSTHORACIC ECHOCARDIOGRAM  01-07-2013   dr [name]   ef 60-65%,  grade 1 diastolic dysfunction/  mild AR without stenosis/  mild LAE/  mild to moderate calcificed MV annulus without regurg. or stenosis/  . UMBILICAL HERNIA REPAIR  yrs ago    There were no vitals filed for  this visit.   Subjective Assessment - 05/05/20 1352    Subjective Patient says he is feeling off today. Not sure why. His balance out of alignment he says.    Limitations Standing;Walking    Patient Stated Goals Get my balance back, get my legs to work better    Currently in Pain? No/denies                             [facility] Adult PT Treatment/Exercise - 05/05/20 0001      Exercises   Exercises Knee/Hip      Knee/Hip Exercises: Standing   Forward Step Up Both;1 set;10 reps;Hand Hold: 1;Step Height: 4"      Knee/Hip Exercises: Seated   Sit to Sand 1 set;10 reps  Vestibular Treatment/Exercise - 05/05/20 0001      Vestibular Treatment/Exercise   Vestibular Treatment Provided Gaze    Gaze Exercises --   saccades horizontal 2 x10, seated head turns and nods 2 x 10 each, standing head turns and nods 2 x 10 each             Balance Exercises - 05/05/20 0001      Balance Exercises: Standing   Standing Eyes Opened Foam/compliant surface;Narrow base of support (BOS);3 reps;30 secs    Other Standing Exercises Comments NBOS eyes open/ closed 3x 30"               PT Short Term Goals - 04/28/20 1326      PT SHORT TERM GOAL #1   Title Patient will be independent with initial HEP and self-management strategies to improve functional outcomes    Time 2    Period Weeks    Status On-going    Target Date 05/14/20      PT SHORT TERM GOAL #2   Title Patient with have 1/2 grade improvement in bilatearl hip abductor strength to improve functional statbility with singel limb stance activitites.    Status On-going      PT SHORT TERM GOAL #3   Title .             PT Long Term Goals - 04/28/20 1327      PT LONG TERM GOAL #1   Title Patient will improve FOTO score by 5 to indicate improvement in functional outcomes    Time 4    Period Weeks    Status On-going      PT LONG TERM GOAL #2   Title Patient will reports no falls since starting  therapy for reduced risk of injury and improved functional outcomes    Time 4    Period Weeks    Status On-going      PT LONG TERM GOAL #3   Title Patient will report at least 75% improvement in ability to ambulate and navigate home and external environment of level surfaces for reduced risk of falls and improved funcitonal mobility    Time 4    Period Weeks    Status On-going      PT LONG TERM GOAL #4   Status On-going                 Plan - 05/05/20 1429    Clinical Impression Statement Patient continues to have intermittent dizziness and feels that he is losing his balance to the LT with standing activity. Patient is very fearful of falling, though he does well with static balance activity. He does show slight tendency to los balance to LT and posterior, but is able to self-correct. He notes feeling dizzy while during standing rest break, but improved with seated rest. Patient will continue to benefit from activity progressions and balance exercise to reduce risk for future falls.    Personal Factors and Comorbidities Time since onset of injury/illness/exacerbation    Examination-Activity Limitations Stairs;Stand;Transfers;Bend    Examination-Participation Restrictions Community Activity;Yard Work;Other    Stability/Clinical Decision Making Stable/Uncomplicated    Rehab Potential Good    PT Frequency 2x / week    PT Duration 4 weeks    PT Treatment/Interventions ADLs/Self Care Home Management;Aquatic Therapy;Biofeedback;Canalith Repostioning;Cryotherapy;Electrical Stimulation;Iontophoresis 4mg /ml Dexamethasone;Moist Heat;Traction;Balance training;Manual techniques;Therapeutic exercise;Therapeutic activities;Functional mobility training;Splinting;Taping;Vasopneumatic Device;Vestibular;Energy conservation;Dry needling;Patient/family education;Orthotic Fit/Training;Passive range of motion;Spinal Manipulations;Joint Manipulations;Stair training;Gait training;DME Instruction;Contrast  Bath;Neuromuscular re-education;Ultrasound;Parrafin;Fluidtherapy;Compression bandaging;Visual/perceptual remediation/compensation;Scar mobilization  PT Next Visit Plan Progress vistibular exercise, habituation and balance activities as tolerated.    PT Home Exercise Plan Eval: saccades; 1/26: sit to stand, heel raises, single leg stance and narrow base of support with head turns    Consulted and Agree with Plan of Care Patient           Patient will benefit from skilled therapeutic intervention in order to improve the following deficits and impairments:  Abnormal gait,Dizziness,Impaired perceived functional ability,Decreased balance,Decreased strength  Visit Diagnosis: Difficulty in walking, not elsewhere classified  Dizziness and giddiness     Problem List Patient Active Problem List   Diagnosis Date Noted  . Genetic testing 01/19/2020  . 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 Family history of kidney cancer   . Constipation 03/04/2019  . CKD (chronic kidney disease), stage III ([place]) 07/10/2018  . Depression, recurrent ([place]) 04/08/2018  . Dysphagia 07/10/2016  . Neuropathy 03/17/2015  . Prostate cancer ([place]) 06/30/2013  . Abdominal aneurysm without mention of rupture 02/19/2013  . Morbid obesity ([place]) 01/14/2013  . VITAMIN B12 DEFICIENCY 01/14/2013  . TOBACCO ABUSE 01/14/2013  . PTSD 01/14/2013  . COPD (chronic obstructive pulmonary disease) ([place])   . OSA (obstructive sleep apnea) 08/14/2012  . RLS (restless legs syndrome) 08/14/2012  . Prediabetes 08/14/2012  . Aortic atherosclerosis ([place]) 02/19/2012  . Coronary atherosclerosis of native coronary artery 03/23/2010  . GERD 07/30/2008  . History of colonic polyps 07/30/2008  . Hyperlipidemia 01/31/2007  . GOUT 01/31/2007  . Essential hypertension, benign 01/31/2007   2:31 PM, 05/05/20 Josue Hector PT DPT  Physical Therapist with [facility]  [phone] [facility] [address] [place], [place], [address] Phone: [phone]   Fax:  [phone]  Name: [name] MRN: [id number] Date of Birth: 06/29/1945

## 2020-05-06 ENCOUNTER — Encounter (HOSPITAL_COMMUNITY): Payer: Self-pay | Admitting: Physical Therapy

## 2020-05-06 ENCOUNTER — Ambulatory Visit (HOSPITAL_COMMUNITY): Payer: Medicare Other | Admitting: Physical Therapy

## 2020-05-06 DIAGNOSIS — R262 Difficulty in walking, not elsewhere classified: Secondary | ICD-10-CM

## 2020-05-06 DIAGNOSIS — R42 Dizziness and giddiness: Secondary | ICD-10-CM

## 2020-05-06 NOTE — Therapy (Signed)
[facility] [address] [place], [place], [address] Phone: [phone]   Fax:  [phone]  Physical Therapy Treatment  Patient Details  Name: RAMSAY BOGNAR MRN: [id number] Date of Birth: Jun 17, 1945 Referring Provider (PT): Caryl Pina MD   Encounter Date: 05/06/2020   PT End of Session - 05/06/20 1500    Visit Number 4    Number of Visits 8    Date for PT Re-Evaluation 05/28/20    Authorization Type Medicare A    PT Start Time 6754    PT Stop Time 1500    PT Time Calculation (min) 40 min    Equipment Utilized During Treatment Gait belt    Activity Tolerance Patient tolerated treatment well;Patient limited by fatigue    Behavior During Therapy [facility] for tasks assessed/performed           Past Medical History:  Diagnosis Date  . Aortic atherosclerosis ([place])    2004  s/p  closure Penetrating atherosclerotic ulcer of infarenal aorta w/ endovascular stent graft  . Arthritis   . CKD (chronic kidney disease), stage III ([place])   . Closed fracture of head of humerus 07/2017   right shoulder from fall; 09-25-2017 per pt no surgical intervention, only wore sling, intermittant pain  . Coronary atherosclerosis of native coronary artery    positive myoview for ischemia 09-27-2009;  10-01-2009 per cardiac cath-- minimal nonobstructive CAD w/ 30% LAD   . ED (erectile dysfunction)   . Emphysema/COPD [facility])    followed by pcp--- last exacerbation 09-12-2017;  09-25-2017 per pt no cough, sob or congestion  . Essential hypertension   . Family history of kidney cancer   . First degree heart block   . Frequency of urination   . GERD (gastroesophageal reflux disease)   . Gout    09-25-2017  per pt stable,  last episode 2015  . Heart murmur   . History of adenomatous polyp of colon   . History of closed head injury 2005   per pt residual resolved  . History of external beam radiation therapy    completed 10/ 2015 for prostate cancer  .  History of gastric ulcer 2014  . History of kidney stones   . History of pneumothorax    spontaneous pneumo treated w/ chest tube  . History of rib fracture 07/2017   from fall,  right side 6th,7th,8th  . Neuropathy   . OAB (overactive bladder)   . OSA (obstructive sleep apnea)    Intolerant of CPAP  . Prostate cancer ([place])    dx 09-26-2011 via bx-- Stage T3b,N0,  Gleason 4+4, PSA 11.2 with METs to external iliac lymph node(resolved with ADT)-- treated w/ ADT ;   05/2013 staging work-up for rising PSA , started casodex and completed radiation therapy 10/ 2015;   rising PSA post treatment  . RLS (restless legs syndrome)   . Ureteral neocystostomy bleed    left side  . Urge urinary incontinence     Past Surgical History:  Procedure Laterality Date  . ABDOMINAL AORTIC ENDOVASCULAR STENT GRAFT  09-13-2007    dr [name]  [facility]   closure penetrating atherosclerotic ulcer of infarenal aorta with stent graft  . BIOPSY  03/06/2019   Procedure: BIOPSY;  Surgeon: [name], MD;  [facility];  Service: Endoscopy;;  gastric  . CARDIAC CATHETERIZATION  2005   per pt normal (done in [place])  . CARDIAC CATHETERIZATION  10/06/2009    dr  cooper   minimal nonobstructive CAD w/30% LAD otherwise normal coronaries, LVEDP 2mmHg  . CARDIOVASCULAR STRESS TEST  09/27/2009    dr Aundra Dubin   lexiscan nuclear study w/ moderate reversible inferior perfusion defect ischemia,  ef 60% (cardiac cath scheduled)  . CHEST TUBE INSERTION  1989   "collapsed lung"due to injury  . CIRCUMCISION  09/26/2011   Procedure: CIRCUMCISION ADULT;  Surgeon: [name], MD;  [facility];  Service: Urology;  Laterality: N/A;  . COLONOSCOPY W/ POLYPECTOMY    . COLONOSCOPY WITH PROPOFOL N/A 04/27/2016    eight 4-8 mm polyps in descending colon, at splenic flexure, in ascending colon and cecum. Tubular adenomas. Surveillance in 3 years.  . CYSTOSCOPY  09/26/2011   Procedure: CYSTOSCOPY;  Surgeon: [name], MD;   [facility];  Service: Urology;  Laterality: N/A;  . CYSTOSCOPY/RETROGRADE/URETEROSCOPY Left 09/27/2017   Procedure: CYSTOSCOPYLEFT Marlow Baars AND RENAL WASHINGS;  Surgeon: [name], MD;  Location: [facility];  Service: Urology;  Laterality: Left;  . ESOPHAGOGASTRODUODENOSCOPY (EGD) WITH PROPOFOL N/A 04/27/2016   normal esophagus s/p dilation, small hiatal hernia, normal duodenum  . ESOPHAGOGASTRODUODENOSCOPY (EGD) WITH PROPOFOL N/A 03/06/2019   erosive reflux esophagitis, s/p dilation, normal duodenum, abnormal gastric mucosa s/p biopsy. Hyperplastic gastric polyp. No H.pylori.   [name] EXTRACORPOREAL SHOCK WAVE LITHOTRIPSY  yrs ago  . FRACTURE SURGERY  child   Bilateral lower arms   . KNEE ARTHROSCOPY Right 2013  . MALONEY DILATION N/A 04/27/2016   Procedure: Venia Minks DILATION;  Surgeon: [name], MD;  [facility];  Service: Endoscopy;  Laterality: N/A;  . Venia Minks DILATION N/A 03/06/2019   Procedure: Venia Minks DILATION;  Surgeon: [name], MD;  [facility];  Service: Endoscopy;  Laterality: N/A;  . PARTIAL KNEE ARTHROPLASTY Right 04/26/2015   Procedure: RIGHT KNEE MEDIAL UNICOMPARTMENTAL ARTHROPLASTY;  Surgeon: Gaynelle Arabian, MD;  [facility];  Service: Orthopedics;  Laterality: Right;  . POLYPECTOMY  04/27/2016   Procedure: POLYPECTOMY;  Surgeon: [name], MD;  [facility];  Service: Endoscopy;;  cecal , ascending, descending, and sigmoid polypectomies  . PROSTATE BIOPSY  09/26/2011   Procedure: BIOPSY TRANSRECTAL ULTRASONIC PROSTATE (TUBP);  Surgeon: [name], MD;  [facility];  Service: Urology;  Laterality: N/A;     . TRANSTHORACIC ECHOCARDIOGRAM  01-07-2013   dr [name]   ef 60-65%,  grade 1 diastolic dysfunction/  mild AR without stenosis/  mild LAE/  mild to moderate calcificed MV annulus without regurg. or stenosis/  . UMBILICAL HERNIA REPAIR  yrs ago    There were no vitals filed for  this visit.   Subjective Assessment - 05/06/20 1500    Subjective Patient says he is dizzy. Has been dizzy all day for no apparent reason. Says he feels like he can't focus and goes forward when he stands up.    Limitations Standing;Walking    Patient Stated Goals Get my balance back, get my legs to work better    Currently in Pain? No/denies                             [facility] Adult PT Treatment/Exercise - 05/06/20 0001      Knee/Hip Exercises: Standing   Heel Raises Both;2 sets;10 reps    Knee Flexion Both;2 sets;10 reps    Hip Abduction Both;2 sets;10 reps    Other Standing Knee Exercises sidestepping at  mat 6RT                    PT Short Term Goals - 04/28/20 1326      PT SHORT TERM GOAL #1   Title Patient will be independent with initial HEP and self-management strategies to improve functional outcomes    Time 2    Period Weeks    Status On-going    Target Date 05/14/20      PT SHORT TERM GOAL #2   Title Patient with have 1/2 grade improvement in bilatearl hip abductor strength to improve functional statbility with singel limb stance activitites.    Status On-going      PT SHORT TERM GOAL #3   Title .             PT Long Term Goals - 04/28/20 1327      PT LONG TERM GOAL #1   Title Patient will improve FOTO score by 5 to indicate improvement in functional outcomes    Time 4    Period Weeks    Status On-going      PT LONG TERM GOAL #2   Title Patient will reports no falls since starting therapy for reduced risk of injury and improved functional outcomes    Time 4    Period Weeks    Status On-going      PT LONG TERM GOAL #3   Title Patient will report at least 75% improvement in ability to ambulate and navigate home and external environment of level surfaces for reduced risk of falls and improved funcitonal mobility    Time 4    Period Weeks    Status On-going      PT LONG TERM GOAL #4   Status On-going                  Plan - 05/06/20 1503    Clinical Impression Statement Patient continues to have complaint of intermittent dizziness. Patients description sound more like hypotensive episodes versus vertigo. Upon further discussion with patient, he admits that he has been very sedentary and that he only drinks about 1 or 2 bottles of water a day. He says he usually drinks coffee or ginger ale. Educated patient on likelyhood that dehydration is playing a role in his dizziness when standing and encouraged more water intake throughout the day. Performed light general LE strengthening to assess patient activity tolerance. He was fine with this. Had him sit to stand following activity, he was fine for about 15 seconds then felt he was "falling forward". Had patient perform same manuever while distracted and naming objects in the room. He was able to do this with no complaint of dizziness with >90 seconds of standing. Issued patient HEP handout. Will assess response next visit.    Personal Factors and Comorbidities Time since onset of injury/illness/exacerbation    Examination-Activity Limitations Stairs;Stand;Transfers;Bend    Examination-Participation Restrictions Community Activity;Yard Work;Other    Stability/Clinical Decision Making Stable/Uncomplicated    Rehab Potential Good    PT Frequency 2x / week    PT Duration 4 weeks    PT Treatment/Interventions ADLs/Self Care Home Management;Aquatic Therapy;Biofeedback;Canalith Repostioning;Cryotherapy;Electrical Stimulation;Iontophoresis 4mg /ml Dexamethasone;Moist Heat;Traction;Balance training;Manual techniques;Therapeutic exercise;Therapeutic activities;Functional mobility training;Splinting;Taping;Vasopneumatic Device;Vestibular;Energy conservation;Dry needling;Patient/family education;Orthotic Fit/Training;Passive range of motion;Spinal Manipulations;Joint Manipulations;Stair training;Gait training;DME Instruction;Contrast Bath;Neuromuscular  re-education;Ultrasound;Parrafin;Fluidtherapy;Compression bandaging;Visual/perceptual remediation/compensation;Scar mobilization    PT Next Visit Plan F/u on drinking more water and general LE exercise and increased activity level on symptoms. Progress as indicated. Add  gait if dizziness is improved    PT Home Exercise Plan Eval: saccades; 1/26: sit to stand, heel raises, single leg stance and narrow base of support with head turns 05/06/20 supported heel raises, sidestepping at counter, knee flexion at counter    Consulted and Agree with Plan of Care Patient           Patient will benefit from skilled therapeutic intervention in order to improve the following deficits and impairments:  Abnormal gait,Dizziness,Impaired perceived functional ability,Decreased balance,Decreased strength  Visit Diagnosis: Difficulty in walking, not elsewhere classified  Dizziness and giddiness     Problem List Patient Active Problem List   Diagnosis Date Noted  . Genetic testing 01/19/2020  . 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 Family history of kidney cancer   . Constipation 03/04/2019  . CKD (chronic kidney disease), stage III ([place]) 07/10/2018  . Depression, recurrent ([place]) 04/08/2018  . Dysphagia 07/10/2016  . Neuropathy 03/17/2015  . Prostate cancer ([place]) 06/30/2013  . Abdominal aneurysm without mention of rupture 02/19/2013  . Morbid obesity ([place]) 01/14/2013  . VITAMIN B12 DEFICIENCY 01/14/2013  . TOBACCO ABUSE 01/14/2013  . PTSD 01/14/2013  . COPD (chronic obstructive pulmonary disease) ([place])   . OSA (obstructive sleep apnea) 08/14/2012  . RLS (restless legs syndrome) 08/14/2012  . Prediabetes 08/14/2012  . Aortic atherosclerosis ([place]) 02/19/2012  . Coronary atherosclerosis of native coronary artery 03/23/2010  . GERD 07/30/2008  . History of colonic polyps 07/30/2008  . Hyperlipidemia 01/31/2007  . GOUT 01/31/2007  . Essential hypertension, benign 01/31/2007   5:12 PM,  05/06/20 Josue Hector PT DPT  Physical Therapist with [facility]  [phone] [place] [address] [place], [place], [address] Phone: [phone]   Fax:  [phone]  Name: MAEL DELAP MRN: [id number] Date of Birth: 01/20/1946

## 2020-05-06 NOTE — Patient Instructions (Signed)
Access Code: [email or web address] URL: [email or web address] Date: 05/06/2020 Prepared by: Josue Hector  Exercises Heel rises with counter support - 3 x daily - 7 x weekly - 2 sets - 10 reps Standing Knee Flexion with Counter Support - 3 x daily - 7 x weekly - 2 sets - 10 reps Side Stepping with Counter Support - 3 x daily - 7 x weekly - 1 sets - 10 reps

## 2020-05-11 ENCOUNTER — Encounter (HOSPITAL_COMMUNITY): Payer: Self-pay | Admitting: Physical Therapy

## 2020-05-11 ENCOUNTER — Other Ambulatory Visit: Payer: Self-pay | Admitting: Family Medicine

## 2020-05-11 ENCOUNTER — Ambulatory Visit (HOSPITAL_COMMUNITY): Payer: Medicare Other | Admitting: Physical Therapy

## 2020-05-11 ENCOUNTER — Other Ambulatory Visit: Payer: Self-pay

## 2020-05-11 DIAGNOSIS — F339 Major depressive disorder, recurrent, unspecified: Secondary | ICD-10-CM

## 2020-05-11 DIAGNOSIS — R42 Dizziness and giddiness: Secondary | ICD-10-CM | POA: Diagnosis not present

## 2020-05-11 DIAGNOSIS — G6289 Other specified polyneuropathies: Secondary | ICD-10-CM

## 2020-05-11 DIAGNOSIS — R262 Difficulty in walking, not elsewhere classified: Secondary | ICD-10-CM | POA: Diagnosis not present

## 2020-05-11 NOTE — Therapy (Signed)
[facility] [address] [place], [place], [address] Phone: [phone]   Fax:  [phone]  Physical Therapy Treatment  Patient Details  Name: [name] MRN: [id number] Date of Birth: Nov 06, 1945 Referring Provider (PT): Caryl Pina MD   Encounter Date: 05/11/2020   PT End of Session - 05/11/20 1358    Visit Number 5    Number of Visits 8    Date for PT Re-Evaluation 05/28/20    Authorization Type Medicare A    PT Start Time 7741    PT Stop Time 1430    PT Time Calculation (min) 42 min    Equipment Utilized During Treatment Gait belt    Activity Tolerance Patient tolerated treatment well;Patient limited by fatigue    Behavior During Therapy [facility] for tasks assessed/performed           Past Medical History:  Diagnosis Date  . Aortic atherosclerosis ([place])    2004  s/p  closure Penetrating atherosclerotic ulcer of infarenal aorta w/ endovascular stent graft  . Arthritis   . CKD (chronic kidney disease), stage III ([place])   . Closed fracture of head of humerus 07/2017   right shoulder from fall; 09-25-2017 per pt no surgical intervention, only wore sling, intermittant pain  . Coronary atherosclerosis of native coronary artery    positive myoview for ischemia 09-27-2009;  10-01-2009 per cardiac cath-- minimal nonobstructive CAD w/ 30% LAD   . ED (erectile dysfunction)   . Emphysema/COPD [facility])    followed by pcp--- last exacerbation 09-12-2017;  09-25-2017 per pt no cough, sob or congestion  . Essential hypertension   . Family history of kidney cancer   . First degree heart block   . Frequency of urination   . GERD (gastroesophageal reflux disease)   . Gout    09-25-2017  per pt stable,  last episode 2015  . Heart murmur   . History of adenomatous polyp of colon   . History of closed head injury 2005   per pt residual resolved  . History of external beam radiation therapy    completed 10/ 2015 for prostate cancer  .  History of gastric ulcer 2014  . History of kidney stones   . History of pneumothorax    spontaneous pneumo treated w/ chest tube  . History of rib fracture 07/2017   from fall,  right side 6th,7th,8th  . Neuropathy   . OAB (overactive bladder)   . OSA (obstructive sleep apnea)    Intolerant of CPAP  . Prostate cancer ([place])    dx 09-26-2011 via bx-- Stage T3b,N0,  Gleason 4+4, PSA 11.2 with METs to external iliac lymph node(resolved with ADT)-- treated w/ ADT ;   05/2013 staging work-up for rising PSA , started casodex and completed radiation therapy 10/ 2015;   rising PSA post treatment  . RLS (restless legs syndrome)   . Ureteral neocystostomy bleed    left side  . Urge urinary incontinence     Past Surgical History:  Procedure Laterality Date  . ABDOMINAL AORTIC ENDOVASCULAR STENT GRAFT  09-13-2007    dr [name]  [facility]   closure penetrating atherosclerotic ulcer of infarenal aorta with stent graft  . BIOPSY  03/06/2019   Procedure: BIOPSY;  Surgeon: [name], MD;  [facility];  Service: Endoscopy;;  gastric  . CARDIAC CATHETERIZATION  2005   per pt normal (done in [place])  . CARDIAC CATHETERIZATION  10/06/2009    dr  cooper   minimal nonobstructive CAD w/30% LAD otherwise normal coronaries, LVEDP 64mmHg  . CARDIOVASCULAR STRESS TEST  09/27/2009    dr Aundra Dubin   lexiscan nuclear study w/ moderate reversible inferior perfusion defect ischemia,  ef 60% (cardiac cath scheduled)  . CHEST TUBE INSERTION  1989   "collapsed lung"due to injury  . CIRCUMCISION  09/26/2011   Procedure: CIRCUMCISION ADULT;  Surgeon: [name], MD;  Location: [facility];  Service: Urology;  Laterality: N/A;  . COLONOSCOPY W/ POLYPECTOMY    . COLONOSCOPY WITH PROPOFOL N/A 04/27/2016    eight 4-8 mm polyps in descending colon, at splenic flexure, in ascending colon and cecum. Tubular adenomas. Surveillance in 3 years.  . CYSTOSCOPY  09/26/2011   Procedure: CYSTOSCOPY;  Surgeon: [name], MD;   Location: [facility];  Service: Urology;  Laterality: N/A;  . CYSTOSCOPY/RETROGRADE/URETEROSCOPY Left 09/27/2017   Procedure: CYSTOSCOPYLEFT Marlow Baars AND RENAL WASHINGS;  Surgeon: [name], MD;  Location: [facility];  Service: Urology;  Laterality: Left;  . ESOPHAGOGASTRODUODENOSCOPY (EGD) WITH PROPOFOL N/A 04/27/2016   normal esophagus s/p dilation, small hiatal hernia, normal duodenum  . ESOPHAGOGASTRODUODENOSCOPY (EGD) WITH PROPOFOL N/A 03/06/2019   erosive reflux esophagitis, s/p dilation, normal duodenum, abnormal gastric mucosa s/p biopsy. Hyperplastic gastric polyp. No H.pylori.   [name] EXTRACORPOREAL SHOCK WAVE LITHOTRIPSY  yrs ago  . FRACTURE SURGERY  child   Bilateral lower arms   . KNEE ARTHROSCOPY Right 2013  . MALONEY DILATION N/A 04/27/2016   Procedure: Venia Minks DILATION;  Surgeon: [name], MD;  [facility];  Service: Endoscopy;  Laterality: N/A;  . Venia Minks DILATION N/A 03/06/2019   Procedure: Venia Minks DILATION;  Surgeon: [name], MD;  [facility];  Service: Endoscopy;  Laterality: N/A;  . PARTIAL KNEE ARTHROPLASTY Right 04/26/2015   Procedure: RIGHT KNEE MEDIAL UNICOMPARTMENTAL ARTHROPLASTY;  Surgeon: Gaynelle Arabian, MD;  [facility];  Service: Orthopedics;  Laterality: Right;  . POLYPECTOMY  04/27/2016   Procedure: POLYPECTOMY;  Surgeon: [name], MD;  [facility];  Service: Endoscopy;;  cecal , ascending, descending, and sigmoid polypectomies  . PROSTATE BIOPSY  09/26/2011   Procedure: BIOPSY TRANSRECTAL ULTRASONIC PROSTATE (TUBP);  Surgeon: [name], MD;  [facility];  Service: Urology;  Laterality: N/A;     . TRANSTHORACIC ECHOCARDIOGRAM  01-07-2013   dr [name]   ef 60-65%,  grade 1 diastolic dysfunction/  mild AR without stenosis/  mild LAE/  mild to moderate calcificed MV annulus without regurg. or stenosis/  . UMBILICAL HERNIA REPAIR  yrs ago    There were no vitals filed for  this visit.   Subjective Assessment - 05/11/20 1353    Subjective Patient says he fell doing his HEP. He says he was feeling confident so he tried doing exercise without support Probation officer) contrary to [employer] instruction and lost his balance and fell. He scraped his arm but otherwise reports no injury. He notes he has been urinating more today, having some issues with incontinence. He denies current pain.    Limitations Standing;Walking    Patient Stated Goals Get my balance back, get my legs to work better    Currently in Pain? No/denies                             [facility] Adult PT Treatment/Exercise - 05/11/20 0001      Knee/Hip Exercises: Standing   Gait  Training 226 feet    Other Standing Knee Exercises sidestepping at // bars 5RT    Other Standing Knee Exercises tandem stance 2 x 30"      Knee/Hip Exercises: Seated   Sit to Sand 3 sets;5 reps;with UE support           Vestibular Treatment/Exercise - 05/11/20 0001      Vestibular Treatment/Exercise   Habituation Exercises Seated Vertical Head Turns;Seated Horizontal Head Turns;Standing Horizontal Head Turns;Standing Vertical Head Turns;180 degree Turns;Comment   nose to knees x10     Seated Horizontal Head Turns   Number of Reps  15      Seated Vertical Head Turns   Number of Reps  15      Standing Horizontal Head Turns   Number of Reps  15      Standing Vertical Head Turns   Number of Reps  15      180 degree Turns   Number of Reps  5   in // bars                  PT Short Term Goals - 04/28/20 1326      PT SHORT TERM GOAL #1   Title Patient will be independent with initial HEP and self-management strategies to improve functional outcomes    Time 2    Period Weeks    Status On-going    Target Date 05/14/20      PT SHORT TERM GOAL #2   Title Patient with have 1/2 grade improvement in bilatearl hip abductor strength to improve functional statbility with singel limb stance activitites.     Status On-going      PT SHORT TERM GOAL #3   Title .             PT Long Term Goals - 04/28/20 1327      PT LONG TERM GOAL #1   Title Patient will improve FOTO score by 5 to indicate improvement in functional outcomes    Time 4    Period Weeks    Status On-going      PT LONG TERM GOAL #2   Title Patient will reports no falls since starting therapy for reduced risk of injury and improved functional outcomes    Time 4    Period Weeks    Status On-going      PT LONG TERM GOAL #3   Title Patient will report at least 75% improvement in ability to ambulate and navigate home and external environment of level surfaces for reduced risk of falls and improved funcitonal mobility    Time 4    Period Weeks    Status On-going      PT LONG TERM GOAL #4   Status On-going                 Plan - 05/11/20 1455    Clinical Impression Statement Patient with mild unsteadiness upon entering clinic. Patient reports fall at home yesterday and has small bruise on forearm but reports no injury otherwise, no apparent change in functional level. Patient able to perform all habituation exercise and static balance with little issue today. Patient reports no onset of dizziness during today's session, but remains apprehensive about falling. Practiced knees to nose and repeated sit to stands which patient had previously reported invokes symptoms. He did not have any issues with these today. He notes he has been trying to drink more water. Patient will continue to benefit  from skilled therapy services to progress strength and balance to reduce risk for falls.    Personal Factors and Comorbidities Time since onset of injury/illness/exacerbation    Examination-Activity Limitations Stairs;Stand;Transfers;Bend    Examination-Participation Restrictions Community Activity;Yard Work;Other    Stability/Clinical Decision Making Stable/Uncomplicated    Rehab Potential Good    PT Frequency 2x / week    PT  Duration 4 weeks    PT Treatment/Interventions ADLs/Self Care Home Management;Aquatic Therapy;Biofeedback;Canalith Repostioning;Cryotherapy;Electrical Stimulation;Iontophoresis 4mg /ml Dexamethasone;Moist Heat;Traction;Balance training;Manual techniques;Therapeutic exercise;Therapeutic activities;Functional mobility training;Splinting;Taping;Vasopneumatic Device;Vestibular;Energy conservation;Dry needling;Patient/family education;Orthotic Fit/Training;Passive range of motion;Spinal Manipulations;Joint Manipulations;Stair training;Gait training;DME Instruction;Contrast Bath;Neuromuscular re-education;Ultrasound;Parrafin;Fluidtherapy;Compression bandaging;Visual/perceptual remediation/compensation;Scar mobilization    PT Next Visit Plan Continue to progress LE strength, static and dynamic balance as tolerated.    PT Home Exercise Plan Eval: saccades; 1/26: sit to stand, heel raises, single leg stance and narrow base of support with head turns 05/06/20 supported heel raises, sidestepping at counter, knee flexion at counter    Consulted and Agree with Plan of Care Patient           Patient will benefit from skilled therapeutic intervention in order to improve the following deficits and impairments:  Abnormal gait,Dizziness,Impaired perceived functional ability,Decreased balance,Decreased strength  Visit Diagnosis: Difficulty in walking, not elsewhere classified  Dizziness and giddiness     Problem List Patient Active Problem List   Diagnosis Date Noted  . Genetic testing 01/19/2020  . 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 Family history of kidney cancer   . Constipation 03/04/2019  . CKD (chronic kidney disease), stage III ([place]) 07/10/2018  . Depression, recurrent ([place]) 04/08/2018  . Dysphagia 07/10/2016  . Neuropathy 03/17/2015  . Prostate cancer ([place]) 06/30/2013  . Abdominal aneurysm without mention of rupture 02/19/2013  . Morbid obesity ([place]) 01/14/2013  . VITAMIN B12  DEFICIENCY 01/14/2013  . TOBACCO ABUSE 01/14/2013  . PTSD 01/14/2013  . COPD (chronic obstructive pulmonary disease) ([place])   . OSA (obstructive sleep apnea) 08/14/2012  . RLS (restless legs syndrome) 08/14/2012  . Prediabetes 08/14/2012  . Aortic atherosclerosis ([place]) 02/19/2012  . Coronary atherosclerosis of native coronary artery 03/23/2010  . GERD 07/30/2008  . History of colonic polyps 07/30/2008  . Hyperlipidemia 01/31/2007  . GOUT 01/31/2007  . Essential hypertension, benign 01/31/2007    3:02 PM, 05/11/20 Josue Hector PT DPT  Physical Therapist with [facility]  [phone] [place] [address] [place], [place], [address] Phone: [phone]   Fax:  [phone]  Name: [name] MRN: [id number] Date of Birth: 1946-01-18

## 2020-05-13 ENCOUNTER — Encounter (HOSPITAL_COMMUNITY): Payer: Medicare Other | Admitting: Physical Therapy

## 2020-05-13 DIAGNOSIS — H01002 Unspecified blepharitis right lower eyelid: Secondary | ICD-10-CM | POA: Diagnosis not present

## 2020-05-13 DIAGNOSIS — H01001 Unspecified blepharitis right upper eyelid: Secondary | ICD-10-CM | POA: Diagnosis not present

## 2020-05-13 DIAGNOSIS — H25813 Combined forms of age-related cataract, bilateral: Secondary | ICD-10-CM | POA: Diagnosis not present

## 2020-05-13 DIAGNOSIS — H02831 Dermatochalasis of right upper eyelid: Secondary | ICD-10-CM | POA: Diagnosis not present

## 2020-05-14 ENCOUNTER — Other Ambulatory Visit (HOSPITAL_COMMUNITY): Payer: Self-pay | Admitting: Hematology

## 2020-05-14 DIAGNOSIS — C61 Malignant neoplasm of prostate: Secondary | ICD-10-CM

## 2020-05-18 ENCOUNTER — Encounter (HOSPITAL_COMMUNITY): Payer: Self-pay | Admitting: General Practice

## 2020-05-18 ENCOUNTER — Encounter (HOSPITAL_COMMUNITY): Payer: Medicare Other | Admitting: Physical Therapy

## 2020-05-18 NOTE — Progress Notes (Signed)
[facility] CSW Progress Notes  Call to patient at request of [facility] nurse navigator after urologist requested [facility] provide additional resources for stress/anxiety management, partoicularly as related to his prostate cancer initially diagnosed in June 2013.  PAtient reported a fear of death and his urologist was going to reach out to his PCP to see if a psychologist referral could be coordinated.  Patient admits that he struggles w anxiety on a daily basis, is often thinking about death and "when this will finally get me."  Denies suicidal intention and relates strong religious prohibition against self harm.  Reports he has difficulty w sleeping due to anxious thoughts.  Discussed chronic nature of his cancer which is currently being managed by oncologist.  In addition to cancer, he has several other chronic and serious medical conditions which may also be impacting his life and having a negative impact on his overall wellness.  He has been unable to engage in activities that he used to enjoy (such as fishing) due to both current weather and multiple comorbid conditions.    Discussed anxiety as a common side effect of cancer diagnosis and treatment, one which must be managed through increased social support, additional resources and sometimes medications.  He plans to see his PCP tomorrow and will discuss these concerns w him.  In addition, CSW provided him with contact information for [employer] - a national prostate cancer support organization which provides both financial support and Chemical engineer groups.  CSW also mailed him information on local prostate cancer support group and [place] resources.  Patient states he will try these resources - CSW will also alert PCP to patient's need for additional help w anxiety management.  [name], LCSW Clinical Social Worker Phone:  [phone]

## 2020-05-19 ENCOUNTER — Telehealth: Payer: Self-pay

## 2020-05-19 ENCOUNTER — Other Ambulatory Visit: Payer: Self-pay

## 2020-05-19 ENCOUNTER — Ambulatory Visit (INDEPENDENT_AMBULATORY_CARE_PROVIDER_SITE_OTHER): Payer: Medicare Other | Admitting: Family Medicine

## 2020-05-19 ENCOUNTER — Encounter: Payer: Self-pay | Admitting: Family Medicine

## 2020-05-19 VITALS — BP 159/90 | HR 75 | Temp 97.4°F | Resp 20 | Ht 68.0 in | Wt 278.0 lb

## 2020-05-19 DIAGNOSIS — G6289 Other specified polyneuropathies: Secondary | ICD-10-CM | POA: Diagnosis not present

## 2020-05-19 DIAGNOSIS — I10 Essential (primary) hypertension: Secondary | ICD-10-CM

## 2020-05-19 DIAGNOSIS — L570 Actinic keratosis: Secondary | ICD-10-CM

## 2020-05-19 DIAGNOSIS — M4726 Other spondylosis with radiculopathy, lumbar region: Secondary | ICD-10-CM

## 2020-05-19 DIAGNOSIS — F339 Major depressive disorder, recurrent, unspecified: Secondary | ICD-10-CM

## 2020-05-19 MED ORDER — TRAMADOL HCL 50 MG PO TABS: 50.0000 mg | ORAL_TABLET | Freq: Every day | ORAL | 1 refills | Status: DC | PRN

## 2020-05-19 MED ORDER — DULOXETINE HCL 60 MG PO CPEP: 60.0000 mg | ORAL_CAPSULE | Freq: Every day | ORAL | 3 refills | Status: DC

## 2020-05-19 MED ORDER — LOSARTAN POTASSIUM 50 MG PO TABS: 50.0000 mg | ORAL_TABLET | Freq: Every day | ORAL | 3 refills | Status: DC

## 2020-05-19 NOTE — Progress Notes (Signed)
BP (!) 159/90   Pulse 75   Temp (!) 97.4 F (36.3 C) (Temporal)   Resp 20   Ht 5\' 8"  (1.727 m)   Wt 278 lb (126.1 kg)   SpO2 98%   BMI 42.27 kg/m    Subjective:   Patient ID: [name], male    DOB: 03/07/1946, 75 y.o.   MRN: [id number]  HPI: [name] is a 75 y.o. male presenting on 05/19/2020 for Place on face that won't heal   HPI And is coming in for a skin lesion on his face that will not heal, is on his left cheek and has been there for quite a few months, he cannot recall but it is just not healing.  He is worried that is turning to a precancerous lesion and wants to get it looked at.  He denies any drainage or purulence or redness or warmth or fever or chills with it.  Relevant past medical, surgical, family and social history reviewed and updated as indicated. Interim medical history since our last visit reviewed. Allergies and medications reviewed and updated.  Review of Systems  Constitutional: Negative for chills and fever.  Musculoskeletal: Negative for back pain and gait problem.  Skin: Positive for rash.  All other systems reviewed and are negative.   Per HPI unless specifically indicated above   Allergies as of 05/19/2020      Reactions   Carbidopa-levodopa Other (See Comments)   hallunications   Morphine Sulfate Nausea And Vomiting   Sulfa Antibiotics Nausea And Vomiting   Sulfacetamide Sodium Nausea And Vomiting      Medication List       Accurate as of May 19, 2020  3:11 PM. If you have any questions, ask your nurse or doctor.        allopurinol 300 MG tablet Commonly known as: ZYLOPRIM Take 1 tablet by mouth once daily   amLODipine 10 MG tablet Commonly known as: NORVASC Take 1 tablet (10 mg total) by mouth daily.   carvedilol 25 MG tablet Commonly known as: COREG Take 1 tablet by mouth twice daily   DULoxetine 60 MG capsule Commonly known as: CYMBALTA Take 1 capsule by mouth once daily (Needs to be seen before  next refill)   losartan 50 MG tablet Commonly known as: COZAAR Take 1 tablet by mouth once daily   mirabegron ER 25 MG Tb24 tablet Commonly known as: MYRBETRIQ Take 1 tablet (25 mg total) by mouth daily.   nystatin powder Commonly known as: MYCOSTATIN/NYSTOP Apply 1 application topically 3 (three) times daily.   pantoprazole 40 MG tablet Commonly known as: PROTONIX Take 40 mg by mouth 2 (two) times daily.   pregabalin 25 MG capsule Commonly known as: LYRICA Take 1 capsule by mouth twice daily   rosuvastatin 20 MG tablet Commonly known as: CRESTOR Take 1 tablet (20 mg total) by mouth daily.   tamsulosin 0.4 MG Caps capsule Commonly known as: FLOMAX Take 1 capsule (0.4 mg total) by mouth daily.   traMADol 50 MG tablet Commonly known as: ULTRAM Take 1 tablet (50 mg total) by mouth daily as needed.   Xtandi 40 MG tablet Generic drug: enzalutamide TAKE 3 TABLETS BY MOUTH ONCE DAILY AT THE SAME TIME. MAY TAKE WITH OR WITHOUT FOOD.        Objective:   BP (!) 159/90   Pulse 75   Temp (!) 97.4 F (36.3 C) (Temporal)   Resp 20   Ht 5\' 8"  (  1.727 m)   Wt 278 lb (126.1 kg)   SpO2 98%   BMI 42.27 kg/m   Wt Readings from Last 3 Encounters:  05/19/20 278 lb (126.1 kg)  04/29/20 282 lb (127.9 kg)  04/21/20 283 lb (128.4 kg)    Physical Exam Vitals and nursing note reviewed.  Skin:    General: Skin is warm.         Actinic keratosis precancerous lesion: Applied 3-10-second bursts of liquid nitrogen, patient tolerated well, return if needed  Assessment & Plan:   Problem List Items Addressed This Visit      Cardiovascular and Mediastinum   Essential hypertension, benign   Relevant Medications   losartan (COZAAR) 50 MG tablet     Other   Depression, recurrent (HCC)   Relevant Medications   DULoxetine (CYMBALTA) 60 MG capsule    Other Visit Diagnoses    Actinic keratosis    -  Primary   Other polyneuropathy       Relevant Medications   DULoxetine  (CYMBALTA) 60 MG capsule   Osteoarthritis of spine with radiculopathy, lumbar region       Relevant Medications   DULoxetine (CYMBALTA) 60 MG capsule   traMADol (ULTRAM) 50 MG tablet      Refills for his tramadol Cymbalta and losartan. Follow up plan: Return if symptoms worsen or fail to improve.  Counseling provided for all of the vaccine components No orders of the defined types were placed in this encounter.   Caryl Pina, MD [facility] 05/19/2020, 3:11 PM

## 2020-05-19 NOTE — Telephone Encounter (Signed)
Intern called and didn't receive an answer. No VM box set up. Intern will call back either tomorrow or next week to try again.   Gaylyn Rong Counseling Intern

## 2020-05-20 ENCOUNTER — Encounter (HOSPITAL_COMMUNITY): Payer: Self-pay | Admitting: Physical Therapy

## 2020-05-20 ENCOUNTER — Ambulatory Visit (HOSPITAL_COMMUNITY): Payer: Medicare Other | Admitting: Physical Therapy

## 2020-05-20 ENCOUNTER — Telehealth: Payer: Self-pay

## 2020-05-20 DIAGNOSIS — R42 Dizziness and giddiness: Secondary | ICD-10-CM | POA: Diagnosis not present

## 2020-05-20 DIAGNOSIS — R262 Difficulty in walking, not elsewhere classified: Secondary | ICD-10-CM

## 2020-05-20 NOTE — Therapy (Signed)
[facility] [address] [place], [place], [address] Phone: [phone]   Fax:  [phone]  Physical Therapy Treatment  Patient Details  Name: [name] MRN: [id number] Date of Birth: [birth year] Referring Provider (PT): Caryl Pina MD   Encounter Date: 05/20/2020   PT End of Session - 05/20/20 1445    Visit Number 6    Number of Visits 8    Date for PT Re-Evaluation 05/28/20    Authorization Type Medicare A    PT Start Time 1435    PT Stop Time 1515    PT Time Calculation (min) 40 min    Equipment Utilized During Treatment Gait belt    Activity Tolerance Patient tolerated treatment well;Patient limited by fatigue    Behavior During Therapy [facility] for tasks assessed/performed           Past Medical History:  Diagnosis Date  . Aortic atherosclerosis ([place])    2004  s/p  closure Penetrating atherosclerotic ulcer of infarenal aorta w/ endovascular stent graft  . Arthritis   . CKD (chronic kidney disease), stage III ([place])   . Closed fracture of head of humerus 07/2017   right shoulder from fall; 09-25-2017 per pt no surgical intervention, only wore sling, intermittant pain  . Coronary atherosclerosis of native coronary artery    positive myoview for ischemia 09-27-2009;  10-01-2009 per cardiac cath-- minimal nonobstructive CAD w/ 30% LAD   . ED (erectile dysfunction)   . Emphysema/COPD [facility])    followed by pcp--- last exacerbation 09-12-2017;  09-25-2017 per pt no cough, sob or congestion  . Essential hypertension   . Family history of kidney cancer   . First degree heart block   . Frequency of urination   . GERD (gastroesophageal reflux disease)   . Gout    09-25-2017  per pt stable,  last episode 2015  . Heart murmur   . History of adenomatous polyp of colon   . History of closed head injury 2005   per pt residual resolved  . History of external beam radiation therapy    completed 10/ 2015 for prostate cancer  .  History of gastric ulcer 2014  . History of kidney stones   . History of pneumothorax    spontaneous pneumo treated w/ chest tube  . History of rib fracture 07/2017   from fall,  right side 6th,7th,8th  . Neuropathy   . OAB (overactive bladder)   . OSA (obstructive sleep apnea)    Intolerant of CPAP  . Prostate cancer ([place])    dx 09-26-2011 via bx-- Stage T3b,N0,  Gleason 4+4, PSA 11.2 with METs to external iliac lymph node(resolved with ADT)-- treated w/ ADT ;   05/2013 staging work-up for rising PSA , started casodex and completed radiation therapy 10/ 2015;   rising PSA post treatment  . RLS (restless legs syndrome)   . Ureteral neocystostomy bleed    left side  . Urge urinary incontinence     Past Surgical History:  Procedure Laterality Date  . ABDOMINAL AORTIC ENDOVASCULAR STENT GRAFT  09-13-2007    dr [name]  [facility]   closure penetrating atherosclerotic ulcer of infarenal aorta with stent graft  . BIOPSY  03/06/2019   Procedure: BIOPSY;  Surgeon: [name], MD;  [facility];  Service: Endoscopy;;  gastric  . CARDIAC CATHETERIZATION  2005   per pt normal (done in [place])  . CARDIAC CATHETERIZATION  10/06/2009    dr  cooper   minimal nonobstructive CAD w/30% LAD otherwise normal coronaries, LVEDP 62mmHg  . CARDIOVASCULAR STRESS TEST  09/27/2009    dr Aundra Dubin   lexiscan nuclear study w/ moderate reversible inferior perfusion defect ischemia,  ef 60% (cardiac cath scheduled)  . CHEST TUBE INSERTION  1989   "collapsed lung"due to injury  . CIRCUMCISION  09/26/2011   Procedure: CIRCUMCISION ADULT;  Surgeon: [name], MD;  Location: [facility];  Service: Urology;  Laterality: N/A;  . COLONOSCOPY W/ POLYPECTOMY    . COLONOSCOPY WITH PROPOFOL N/A 04/27/2016    eight 4-8 mm polyps in descending colon, at splenic flexure, in ascending colon and cecum. Tubular adenomas. Surveillance in 3 years.  . CYSTOSCOPY  09/26/2011   Procedure: CYSTOSCOPY;  Surgeon: [name], MD;   Location: [facility];  Service: Urology;  Laterality: N/A;  . CYSTOSCOPY/RETROGRADE/URETEROSCOPY Left 09/27/2017   Procedure: CYSTOSCOPYLEFT Marlow Baars AND RENAL WASHINGS;  Surgeon: [name], MD;  Location: [facility];  Service: Urology;  Laterality: Left;  . ESOPHAGOGASTRODUODENOSCOPY (EGD) WITH PROPOFOL N/A 04/27/2016   normal esophagus s/p dilation, small hiatal hernia, normal duodenum  . ESOPHAGOGASTRODUODENOSCOPY (EGD) WITH PROPOFOL N/A 03/06/2019   erosive reflux esophagitis, s/p dilation, normal duodenum, abnormal gastric mucosa s/p biopsy. Hyperplastic gastric polyp. No H.pylori.   [name] EXTRACORPOREAL SHOCK WAVE LITHOTRIPSY  yrs ago  . FRACTURE SURGERY  child   Bilateral lower arms   . KNEE ARTHROSCOPY Right 2013  . MALONEY DILATION N/A 04/27/2016   Procedure: Venia Minks DILATION;  Surgeon: [name], MD;  [facility];  Service: Endoscopy;  Laterality: N/A;  . Venia Minks DILATION N/A 03/06/2019   Procedure: Venia Minks DILATION;  Surgeon: [name], MD;  [facility];  Service: Endoscopy;  Laterality: N/A;  . PARTIAL KNEE ARTHROPLASTY Right 04/26/2015   Procedure: RIGHT KNEE MEDIAL UNICOMPARTMENTAL ARTHROPLASTY;  Surgeon: Gaynelle Arabian, MD;  [facility];  Service: Orthopedics;  Laterality: Right;  . POLYPECTOMY  04/27/2016   Procedure: POLYPECTOMY;  Surgeon: [name], MD;  [facility];  Service: Endoscopy;;  cecal , ascending, descending, and sigmoid polypectomies  . PROSTATE BIOPSY  09/26/2011   Procedure: BIOPSY TRANSRECTAL ULTRASONIC PROSTATE (TUBP);  Surgeon: [name], MD;  Location[facility];  Service: Urology;  Laterality: N/A;     . TRANSTHORACIC ECHOCARDIOGRAM  01-07-2013   dr [name]   ef 60-65%,  grade 1 diastolic dysfunction/  mild AR without stenosis/  mild LAE/  mild to moderate calcificed MV annulus without regurg. or stenosis/  . UMBILICAL HERNIA REPAIR  yrs ago    There were no vitals filed for  this visit.   Subjective Assessment - 05/20/20 1442    Subjective Patient says he is in pain today. Says he had some MD appointments and has to get some surgeries for cataracts and skin on his face. Says his balance is "terrible" and he continues to trip over his own feet. No falls reported.    Limitations Standing;Walking    Patient Stated Goals Get my balance back, get my legs to work better    Currently in Pain? Yes    Pain Score 9     Pain Location Back    Pain Orientation Posterior    Pain Descriptors / Indicators Aching    Pain Type Chronic pain    Pain Onset More than a month ago    Pain Frequency Intermittent  [facility] Adult PT Treatment/Exercise - 05/20/20 0001      Knee/Hip Exercises: Standing   Heel Raises Both;2 sets;10 reps    Heel Raises Limitations Toe raises 2 x 10               Balance Exercises - 05/20/20 0001      Balance Exercises: Standing   Tandem Stance Eyes open;3 reps;30 secs    Gait with Head Turns 3 reps   3 x head nods, 3 x head turns   Tandem Gait Forward;3 reps    Sidestepping 3 reps               PT Short Term Goals - 04/28/20 1326      PT SHORT TERM GOAL #1   Title Patient will be independent with initial HEP and self-management strategies to improve functional outcomes    Time 2    Period Weeks    Status On-going    Target Date 05/14/20      PT SHORT TERM GOAL #2   Title Patient with have 1/2 grade improvement in bilatearl hip abductor strength to improve functional statbility with singel limb stance activitites.    Status On-going      PT SHORT TERM GOAL #3   Title .             PT Long Term Goals - 04/28/20 1327      PT LONG TERM GOAL #1   Title Patient will improve FOTO score by 5 to indicate improvement in functional outcomes    Time 4    Period Weeks    Status On-going      PT LONG TERM GOAL #2   Title Patient will reports no falls since starting therapy for  reduced risk of injury and improved functional outcomes    Time 4    Period Weeks    Status On-going      PT LONG TERM GOAL #3   Title Patient will report at least 75% improvement in ability to ambulate and navigate home and external environment of level surfaces for reduced risk of falls and improved funcitonal mobility    Time 4    Period Weeks    Status On-going      PT LONG TERM GOAL #4   Status On-going                 Plan - 05/20/20 1530    Clinical Impression Statement Patient doing better with balance activity today but limited by low back pain. Patient able to progress to dynamic balance with tandem gait and gait with head turns. He was apprehensive about tandem gait but did well once able to initiate. Patient with no reports on dizziness during session today, he did have 1 or 2 minor episodes of LOB during dynamic gait but was able to self-correct. Patient will continue to benefit from skilled therapy services to progress dynamic balance to improve functional mobility and reduce risk for falls.    Personal Factors and Comorbidities Time since onset of injury/illness/exacerbation    Examination-Activity Limitations Stairs;Stand;Transfers;Bend    Examination-Participation Restrictions Community Activity;Yard Work;Other    Stability/Clinical Decision Making Stable/Uncomplicated    Rehab Potential Good    PT Frequency 2x / week    PT Duration 4 weeks    PT Treatment/Interventions ADLs/Self Care Home Management;Aquatic Therapy;Biofeedback;Canalith Repostioning;Cryotherapy;Electrical Stimulation;Iontophoresis 4mg /ml Dexamethasone;Moist Heat;Traction;Balance training;Manual techniques;Therapeutic exercise;Therapeutic activities;Functional mobility training;Splinting;Taping;Vasopneumatic Device;Vestibular;Energy conservation;Dry needling;Patient/family education;Orthotic Fit/Training;Passive range of motion;Spinal Manipulations;Joint Manipulations;Stair training;Gait training;DME  Instruction;Contrast Bath;Neuromuscular re-education;Ultrasound;Parrafin;Fluidtherapy;Compression bandaging;Visual/perceptual remediation/compensation;Scar mobilization    PT Next Visit Plan Continue to progress LE strength, static and dynamic balance as tolerated. Try retro walking    PT Home Exercise Plan Eval: saccades; 1/26: sit to stand, heel raises, single leg stance and narrow base of support with head turns 05/06/20 supported heel raises, sidestepping at counter, knee flexion at counter    Consulted and Agree with Plan of Care Patient           Patient will benefit from skilled therapeutic intervention in order to improve the following deficits and impairments:  Abnormal gait,Dizziness,Impaired perceived functional ability,Decreased balance,Decreased strength  Visit Diagnosis: Difficulty in walking, not elsewhere classified  Dizziness and giddiness     Problem List Patient Active Problem List   Diagnosis Date Noted  . Genetic testing 01/19/2020  . 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 Family history of kidney cancer   . Constipation 03/04/2019  . CKD (chronic kidney disease), stage III ([place]) 07/10/2018  . Depression, recurrent ([place]) 04/08/2018  . Dysphagia 07/10/2016  . Neuropathy 03/17/2015  . Prostate cancer ([place]) 06/30/2013  . Abdominal aneurysm without mention of rupture 02/19/2013  . Morbid obesity ([place]) 01/14/2013  . VITAMIN B12 DEFICIENCY 01/14/2013  . TOBACCO ABUSE 01/14/2013  . PTSD 01/14/2013  . COPD (chronic obstructive pulmonary disease) ([place])   . OSA (obstructive sleep apnea) 08/14/2012  . RLS (restless legs syndrome) 08/14/2012  . Prediabetes 08/14/2012  . Aortic atherosclerosis ([place]) 02/19/2012  . Coronary atherosclerosis of native coronary artery 03/23/2010  . GERD 07/30/2008  . History of colonic polyps 07/30/2008  . Hyperlipidemia 01/31/2007  . GOUT 01/31/2007  . Essential hypertension, benign 01/31/2007   3:39 PM, 05/20/20 Josue Hector PT DPT  Physical Therapist with [facility]  [phone] [place] [address] [place], [place], [address] Phone: [phone]   Fax:  [phone]  Name: [name] MRN: [id number] Date of Birth: [birth year]

## 2020-05-20 NOTE — Telephone Encounter (Signed)
Set up an intake session for 2/22 at 1 pm. Will mail paperwork.   Gaylyn Rong Counseling Intern

## 2020-05-24 ENCOUNTER — Telehealth (HOSPITAL_COMMUNITY): Payer: Self-pay | Admitting: Physical Therapy

## 2020-05-24 ENCOUNTER — Ambulatory Visit (HOSPITAL_COMMUNITY): Payer: Medicare Other | Admitting: Physical Therapy

## 2020-05-24 NOTE — Telephone Encounter (Signed)
Called patient about missed appointment. No answer and VM not set up, called both lines listed.  5:48 PM, 05/24/20 Josue Hector PT DPT  Physical Therapist with [facility]  [phone]

## 2020-05-25 ENCOUNTER — Telehealth: Payer: Self-pay

## 2020-05-25 NOTE — Telephone Encounter (Signed)
Made three calls between the two numbers before I got an answer. Patient said it was not a good time to meet. Intern will assess for need and reach back out if necessary.  Gaylyn Rong Counseling Intern

## 2020-05-26 ENCOUNTER — Ambulatory Visit (HOSPITAL_COMMUNITY): Payer: Medicare Other | Admitting: Physical Therapy

## 2020-05-28 ENCOUNTER — Telehealth (HOSPITAL_COMMUNITY): Payer: Self-pay | Admitting: Pharmacy Technician

## 2020-05-28 NOTE — Telephone Encounter (Signed)
Oral Oncology Patient Advocate Encounter  [name] called me stating that [facility] was wanting and $80 copay for his Xtandi.  Patient previously had a grant through [employer] to cover his copay but was not reenrolled for 2022 due to lack of funding.   I tried to sign Russell Monica up for a copay card but it rejected stating that he may already have a card.  I called card services and it seems that I signed the patient up in January but the email instructions were not completed.  His son in law signed him up for a copay card at the end of January also.  The representative instructed patient that one application would be inactivated and that he needed to contact his son in law to open up the email to complete step 2 of the email which would provide an electronic debit card to cover the copay at the pharmacy.  Patient is going to contact his son in law, [name], to see if he can get this information for him.  Once patient gets this information I will contact [facility] with all information.  Copay card billing information:  BIN [id number] PCN LOYALTY ID [id number] GROUP [id number]  [name] [name] Phone [phone] Fax [phone] 05/28/2020 2:47 PM

## 2020-06-07 ENCOUNTER — Encounter: Payer: Self-pay | Admitting: Genetic Counselor

## 2020-06-07 NOTE — Telephone Encounter (Signed)
Patient called today.  He only has 4 days of medication on hand and his son in law never received the email that included the electronic debit card for his Gillermina Phy.    I called [employer] again to try to get them to help Mr Mahany get this information. He verified the email for his son in law as [email or web address].  He will call Aaron Edelman (son in law) to have him check his email.  Once he gets the information, Mr Nazir will call me with it to give to the pharmacy.  [place] Patient [place] Phone [phone] Fax [phone] 06/07/2020 1:45 PM

## 2020-06-09 NOTE — Telephone Encounter (Signed)
Called [name]'s son in law, [name], to see if I could assist him with the email to retrieve debit card info for [place] copay card.  [name] was able to put in patient's last name, DOB, and email address to print off debit card information.  [name] relayed that information to me and I called [facility] and put the card on file with the copay card information.  I called [name] to let him know I had updated the pharmacy and that he could call to set up shipment of his medication. He was very appreciative for the help in getting him his medication.  [place] Patient [name] Phone [phone] Fax [phone] 06/09/2020 2:58 PM

## 2020-06-11 NOTE — Telephone Encounter (Signed)
Patient received Xtandi today. $0 copay.  Patient very appreciative of all the help in getting his medication issue handled.  [place] Patient [name] Phone [phone] Fax [phone] 06/11/2020 11:27 AM

## 2020-06-14 ENCOUNTER — Ambulatory Visit (INDEPENDENT_AMBULATORY_CARE_PROVIDER_SITE_OTHER): Payer: Medicare Other | Admitting: Clinical

## 2020-06-14 ENCOUNTER — Other Ambulatory Visit: Payer: Self-pay

## 2020-06-14 DIAGNOSIS — F339 Major depressive disorder, recurrent, unspecified: Secondary | ICD-10-CM | POA: Diagnosis not present
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
## 2020-06-18 ENCOUNTER — Ambulatory Visit (INDEPENDENT_AMBULATORY_CARE_PROVIDER_SITE_OTHER): Payer: Medicare Other | Admitting: Family Medicine

## 2020-06-18 ENCOUNTER — Encounter: Payer: Self-pay | Admitting: Family Medicine

## 2020-06-18 DIAGNOSIS — R197 Diarrhea, unspecified: Secondary | ICD-10-CM | POA: Diagnosis not present

## 2020-06-18 NOTE — Progress Notes (Signed)
Virtual Visit via Telephone Note  I connected with [name] on 06/18/20 at 1:22 PM by telephone and verified that I am speaking with the correct person using two identifiers. [name] is currently located at home and his wife is currently with him during this visit. The provider, Russell [place], FNP is located in their home at time of visit.  I discussed the limitations, risks, security and privacy concerns of performing an evaluation and management service by telephone and the availability of in person appointments. I also discussed with the patient that there may be a patient responsible charge related to this service. The patient expressed understanding and agreed to proceed.  Subjective: PCP: [name], [name], MD  Chief Complaint  Patient presents with  . Diarrhea  . Nasal Congestion   Patient reports he has had diarrhea for the past 3 days.  He is going approximately every hour and states it is watery.  He he has also had a runny nose for the past 2 weeks.  He denies any other symptoms.  He just wants to make sure he does not have COVID-19 before he goes around his family.  He feels this may be food related as he ate some cottage cheese prior to the onset of the diarrhea that he reports did not taste right.  His wife does not have any symptoms, but she did not eat the cottage cheese.  Patient has received his first 2 COVID-19 Moderna vaccines.   ROS: Per HPI  Current Outpatient Medications:  .  allopurinol (ZYLOPRIM) 300 MG tablet, Take 1 tablet by mouth once daily, Disp: 90 tablet, Rfl: 0 .  amLODipine (NORVASC) 10 MG tablet, Take 1 tablet (10 mg total) by mouth daily., Disp: 90 tablet, Rfl: 0 .  carvedilol (COREG) 25 MG tablet, Take 1 tablet by mouth twice daily, Disp: 180 tablet, Rfl: 0 .  DULoxetine (CYMBALTA) 60 MG capsule, Take 1 capsule (60 mg total) by mouth daily. Take 1 capsule by mouth once daily (Needs to be seen before next refill), Disp: 90 capsule,  Rfl: 3 .  losartan (COZAAR) 50 MG tablet, Take 1 tablet (50 mg total) by mouth daily., Disp: 90 tablet, Rfl: 3 .  mirabegron ER (MYRBETRIQ) 25 MG TB24 tablet, Take 1 tablet (25 mg total) by mouth daily., Disp: 28 tablet, Rfl: 0 .  nystatin (MYCOSTATIN/NYSTOP) powder, Apply 1 application topically 3 (three) times daily., Disp: 15 g, Rfl: 0 .  pantoprazole (PROTONIX) 40 MG tablet, Take 40 mg by mouth 2 (two) times daily., Disp: , Rfl:  .  pregabalin (LYRICA) 25 MG capsule, Take 1 capsule by mouth twice daily, Disp: 60 capsule, Rfl: 3 .  rosuvastatin (CRESTOR) 20 MG tablet, Take 1 tablet (20 mg total) by mouth daily., Disp: 90 tablet, Rfl: 3 .  tamsulosin (FLOMAX) 0.4 MG CAPS capsule, Take 1 capsule (0.4 mg total) by mouth daily., Disp: 90 capsule, Rfl: 3 .  traMADol (ULTRAM) 50 MG tablet, Take 1 tablet (50 mg total) by mouth daily as needed., Disp: 30 tablet, Rfl: 1 .  XTANDI 40 MG tablet, TAKE 3 TABLETS BY MOUTH ONCE DAILY AT THE SAME TIME. MAY TAKE WITH OR WITHOUT FOOD., Disp: 90 tablet, Rfl: 0  Allergies  Allergen Reactions  . Carbidopa-Levodopa Other (See Comments)    hallunications  . Morphine Sulfate Nausea And Vomiting  . Sulfa Antibiotics Nausea And Vomiting  . Sulfacetamide Sodium Nausea And Vomiting   Past Medical History:  Diagnosis Date  .  Aortic atherosclerosis ([place])    2004  s/p  closure Penetrating atherosclerotic ulcer of infarenal aorta w/ endovascular stent graft  . Arthritis   . CKD (chronic kidney disease), stage III ([place])   . Closed fracture of head of humerus 07/2017   right shoulder from fall; 09-25-2017 per pt no surgical intervention, only wore sling, intermittant pain  . Coronary atherosclerosis of native coronary artery    positive myoview for ischemia 09-27-2009;  10-01-2009 per cardiac cath-- minimal nonobstructive CAD w/ 30% LAD   . ED (erectile dysfunction)   . Emphysema/COPD [facility])    followed by pcp--- last exacerbation 09-12-2017;  09-25-2017 per pt no  cough, sob or congestion  . Essential hypertension   . Family history of kidney cancer   . First degree heart block   . Frequency of urination   . GERD (gastroesophageal reflux disease)   . Gout    09-25-2017  per pt stable,  last episode 2015  . Heart murmur   . History of adenomatous polyp of colon   . History of closed head injury 2005   per pt residual resolved  . History of external beam radiation therapy    completed 10/ 2015 for prostate cancer  . History of gastric ulcer 2014  . History of kidney stones   . History of pneumothorax    spontaneous pneumo treated w/ chest tube  . History of rib fracture 07/2017   from fall,  right side 6th,7th,8th  . Neuropathy   . OAB (overactive bladder)   . OSA (obstructive sleep apnea)    Intolerant of CPAP  . Prostate cancer ([place])    dx 09-26-2011 via bx-- Stage T3b,N0,  Gleason 4+4, PSA 11.2 with METs to external iliac lymph node(resolved with ADT)-- treated w/ ADT ;   05/2013 staging work-up for rising PSA , started casodex and completed radiation therapy 10/ 2015;   rising PSA post treatment  . RLS (restless legs syndrome)   . Ureteral neocystostomy bleed    left side  . Urge urinary incontinence     Observations/Objective: A&O  No respiratory distress or wheezing audible over the phone Mood, judgement, and thought processes all WNL  Assessment and Plan: 1. Diarrhea, unspecified type Patient is going to come for COVID-19 testing today.  Discussed we could do stool testing if he would like, but he would like to wait on that.  He is going to go purchase some Imodium OTC. - Novel Coronavirus, NAA ([facility]); Future   Follow Up Instructions:  I discussed the assessment and treatment plan with the patient. The patient was provided an opportunity to ask questions and all were answered. The patient agreed with the plan and demonstrated an understanding of the instructions.   The patient was advised to call back or seek an in-person  evaluation if the symptoms worsen or if the condition fails to improve as anticipated.  The above assessment and management plan was discussed with the patient. The patient verbalized understanding of and has agreed to the management plan. Patient is aware to call the clinic if symptoms persist or worsen. Patient is aware when to return to the clinic for a follow-up visit. Patient educated on when it is appropriate to go to the emergency department.   Time call ended: 1:33 PM  I provided 11 minutes of non-face-to-face time during this encounter.  Hendricks Limes, MSN, APRN, FNP-C [facility] 06/18/20

## 2020-06-18 NOTE — Addendum Note (Signed)
Addended by: Liliane Bade on: 06/18/2020 03:22 PM   Modules accepted: Orders

## 2020-06-19 LAB — NOVEL CORONAVIRUS, NAA: SARS-CoV-2, NAA: NOT DETECTED

## 2020-06-19 LAB — SARS-COV-2, NAA 2 DAY TAT

## 2020-06-28 ENCOUNTER — Encounter (HOSPITAL_COMMUNITY): Payer: Self-pay | Admitting: Physical Therapy

## 2020-06-28 NOTE — Therapy (Signed)
[place], [place], [address] Phone: [phone]   Fax:  [phone]  Patient Details  Name: [name] MRN: [id number] Date of Birth: [birth year] Referring Provider:  No ref. provider found  Encounter Date: 06/28/2020   PHYSICAL THERAPY DISCHARGE SUMMARY  Visits from Start of Care: 6  Current functional level related to goals / functional outcomes: Pt still feels off balance   Remaining deficits: Office manager / Equipment: HEP Plan: Patient agrees to discharge.  Patient goals were not met. Patient is being discharged due to not returning since the last visit.  ?????     Rayetta Humphrey, PT CLT [phone] 06/28/2020, 3:41 PM  [place] [address] [place], [place], [address] Phone: [phone]   Fax:  [phone]

## 2020-07-05 ENCOUNTER — Ambulatory Visit (INDEPENDENT_AMBULATORY_CARE_PROVIDER_SITE_OTHER): Payer: Medicare Other | Admitting: Clinical

## 2020-07-05 ENCOUNTER — Other Ambulatory Visit: Payer: Self-pay

## 2020-07-05 DIAGNOSIS — F339 Major depressive disorder, recurrent, unspecified: Secondary | ICD-10-CM

## 2020-07-05 DIAGNOSIS — H25811 Combined forms of age-related cataract, right eye: Secondary | ICD-10-CM | POA: Diagnosis not present

## 2020-07-05 NOTE — Progress Notes (Signed)
Virtual Visit via Telephone Note  I connected with [name] on 07/05/20 at  2:00 PM EDT by telephone and verified that I am speaking with the correct person using two identifiers.  Location: Patient: Home Provider: Office   I discussed the limitations, risks, security and privacy concerns of performing an evaluation and management service by telephone and the availability of in person appointments. I also discussed with the patient that there may be a patient responsible charge related to this service. The patient expressed understanding and agreed to proceed.    THERAPIST PROGRESS NOTE  Session Time:2:00PM-2:55PM  Participation Level:Active  Behavioral Response:CasualAlertIrratible  Type of Therapy:Individual Therapy  Treatment Goals addressed:Coping  Interventions:CBT, Solution Focused, Strength-based, Supportive and Social Skills Training  Summary: [name] [phone].[email or web address] presents withDepression.The OPT therapist worked with thepatientfor [place]. The OPT therapist utilized Motivational Interviewing to assist in creating therapeutic repore. The patient in the session was engaged and work in collaboration giving feedback about his triggers and symptoms over the past few weeksincludinghealth concerns and financial stress.The OPT therapist utilized Cognitive Behavioral Therapy through cognitive restructuring as well as worked with the patient on coping strategies to assist in management ofmood, improve coping, and improve positive thinking while empowering the patienttobe active and not self isolate.The OPT therapist examined upcoming health appointments and continued to encourage the patient to stay consistent and compliant with all medical appointments and recommendations.  Suicidal/Homicidal:Nowithout intent/plan  Therapist Response:The OPT therapist worked with the patient for the patients scheduled session. The  patient was engaged in his session and gave feedback in relation to triggers, symptoms, and behavior responses over the past fewweeks. The OPT therapist worked with the patient utilizing an in session Cognitive Behavioral Therapy exercise. The patient was responsive in the session andverbalized, "I have to find something that doesn't cost a lot to do". The OPT therapist worked with the patient to review potential individual leisure..The OPT therapist will continue treatment work with the patient in his next scheduled session.   Plan: Return again in2/3weeks.  Diagnosis:Axis I:Recurrent Depression with Anxiety Axis II:No diagnosis  I discussed the assessment and treatment plan with the patient. The patient was provided an opportunity to ask questions and all were answered. The patient agreed with the plan and demonstrated an understanding of the instructions.  The patient was advised to call back or seek an in-person evaluation if the symptoms worsen or if the condition fails to improve as anticipated.  I provided14minutes of non-face-to-face time during this encounter.  Russell Hides, LCSW  07/05/2020

## 2020-07-06 ENCOUNTER — Encounter (HOSPITAL_COMMUNITY)
Admission: RE | Admit: 2020-07-06 | Discharge: 2020-07-06 | Disposition: A | Discharge status: Home / Self Care | Payer: Medicare Other | Source: Ambulatory Visit | Attending: Ophthalmology | Admitting: Ophthalmology

## 2020-07-06 ENCOUNTER — Other Ambulatory Visit: Payer: Self-pay

## 2020-07-06 ENCOUNTER — Encounter (HOSPITAL_COMMUNITY): Payer: Self-pay

## 2020-07-06 NOTE — H&P (Signed)
Surgical History & Physical  Patient Name: [name] DOB: [birth year]  Surgery: Cataract extraction with intraocular lens implant phacoemulsification; Right Eye  Surgeon: [name] MD Surgery Date:  07/12/2020 Pre-Op Date:  07/06/2020  HPI: A 89 Yr. old male patient is referred by Dr Betsey Amen for cataract eval. 1. The patient complains of nighttime light - car headlights, street lamps etc. glare causing poor vision, which began 1 year ago. Both eyes are affected. The episode is gradual. The condition's severity increased since last visit. Symptoms occur when the patient is driving and outside. This is negatively affecting his quality of life. HPI was performed by [name] .  Medical History: Cataracts High Blood Pressure LDL Prostate cancer, GERD, Myopathy  Review of Systems Negative Allergic/Immunologic Negative Cardiovascular Negative Constitutional Negative Ear, Nose, Mouth & Throat Negative Endocrine Negative Eyes Negative Gastrointestinal Negative Genitourinary Negative Hemotologic/Lymphatic Negative Integumentary Negative Musculoskeletal Negative Neurological Negative Psychiatry Negative Respiratory  Social   Current every day smoker   Medication Tamsulosin, Allopurinol, Amlodipine besylate, Losartan Potassium, Carvedilol, Duloxetine, Enzalutamide, Gemteza, Nystatin, Pantoprazole, Pregabalin, Rosuvastatin, Tramadol hydrochloride,   Sx/Procedures Knee Replacement,   Drug Allergies  Sulfa, Morphine,   History & Physical: Heent:  Cataract, Right eye NECK: supple without bruits LUNGS: lungs clear to auscultation CV: regular rate and rhythm Abdomen: soft and non-tender  Impression & Plan: Assessment: 1.  COMBINED FORMS AGE RELATED CATARACT; Both Eyes (H25.813) 2.  BLEPHARITIS; Right Upper Lid, Right Lower Lid, Left Upper Lid, Left Lower Lid (H01.001, H01.002,H01.004,H01.005) 3.  Pinguecula; Both Eyes (H11.153) 4.  DERMATOCHALASIS; Right Upper  Lid, Left Upper Lid (H02.831, K56.256)  Plan: 1.  Cataract accounts for the patient's decreased vision. This visual impairment is not correctable with a tolerable change in glasses or contact lenses. Cataract surgery with an implantation of a new lens should significantly improve the visual and functional status of the patient. Discussed all risks, benefits, alternatives, and potential complications. Discussed the procedures and recovery. Patient desires to have surgery. A-scan ordered and performed today for intra-ocular lens calculations. The surgery will be performed in order to improve vision for driving, reading, and for eye examinations. Recommend phacoemulsification with intra-ocular lens. Recommend Dextenza for post-operative pain and inflammation. Right Eye worse - first. Dilates poorly - shugacaine by protocol. Malyugin Ring. Omidira. 2.  regular lid cleaning. 3.  Observe; Artificial tears as needed for irritation. 4.  Asymptomatic, recommend observation for now. Findings, prognosis and treatment options reviewed.

## 2020-07-09 ENCOUNTER — Other Ambulatory Visit (HOSPITAL_COMMUNITY)
Admission: RE | Admit: 2020-07-09 | Discharge: 2020-07-09 | Disposition: A | Discharge status: Home / Self Care | Payer: Medicare Other | Source: Ambulatory Visit | Attending: Ophthalmology | Admitting: Ophthalmology

## 2020-07-09 ENCOUNTER — Other Ambulatory Visit: Payer: Self-pay

## 2020-07-09 DIAGNOSIS — Z20822 Contact with and (suspected) exposure to covid-19: Secondary | ICD-10-CM | POA: Insufficient documentation

## 2020-07-09 DIAGNOSIS — Z01812 Encounter for preprocedural laboratory examination: Secondary | ICD-10-CM | POA: Insufficient documentation

## 2020-07-10 LAB — SARS CORONAVIRUS 2 (TAT 6-24 HRS): SARS Coronavirus 2: NEGATIVE

## 2020-07-12 ENCOUNTER — Ambulatory Visit (HOSPITAL_COMMUNITY): Payer: Medicare Other | Admitting: Anesthesiology

## 2020-07-12 ENCOUNTER — Ambulatory Visit (HOSPITAL_COMMUNITY)
Admission: RE | Admit: 2020-07-12 | Discharge: 2020-07-12 | Disposition: A | Discharge status: Home / Self Care | Payer: Medicare Other | Source: Ambulatory Visit | Attending: Ophthalmology | Admitting: Ophthalmology

## 2020-07-12 ENCOUNTER — Encounter (HOSPITAL_COMMUNITY)
Admission: RE | Disposition: A | Discharge status: Home / Self Care | Payer: Self-pay | Source: Ambulatory Visit | Attending: Ophthalmology

## 2020-07-12 ENCOUNTER — Encounter (HOSPITAL_COMMUNITY): Payer: Self-pay | Admitting: Ophthalmology

## 2020-07-12 DIAGNOSIS — Z79899 Other long term (current) drug therapy: Secondary | ICD-10-CM | POA: Insufficient documentation

## 2020-07-12 DIAGNOSIS — F17200 Nicotine dependence, unspecified, uncomplicated: Secondary | ICD-10-CM | POA: Diagnosis not present

## 2020-07-12 DIAGNOSIS — H11153 Pinguecula, bilateral: Secondary | ICD-10-CM | POA: Diagnosis not present

## 2020-07-12 DIAGNOSIS — I2510 Atherosclerotic heart disease of native coronary artery without angina pectoris: Secondary | ICD-10-CM | POA: Diagnosis not present

## 2020-07-12 DIAGNOSIS — H0100A Unspecified blepharitis right eye, upper and lower eyelids: Secondary | ICD-10-CM | POA: Insufficient documentation

## 2020-07-12 DIAGNOSIS — H02834 Dermatochalasis of left upper eyelid: Secondary | ICD-10-CM | POA: Diagnosis not present

## 2020-07-12 DIAGNOSIS — H2181 Floppy iris syndrome: Secondary | ICD-10-CM | POA: Insufficient documentation

## 2020-07-12 DIAGNOSIS — H25811 Combined forms of age-related cataract, right eye: Secondary | ICD-10-CM | POA: Diagnosis not present

## 2020-07-12 DIAGNOSIS — H0100B Unspecified blepharitis left eye, upper and lower eyelids: Secondary | ICD-10-CM | POA: Insufficient documentation

## 2020-07-12 DIAGNOSIS — H25819 Combined forms of age-related cataract, unspecified eye: Secondary | ICD-10-CM | POA: Diagnosis present

## 2020-07-12 DIAGNOSIS — H02831 Dermatochalasis of right upper eyelid: Secondary | ICD-10-CM | POA: Diagnosis not present

## 2020-07-12 DIAGNOSIS — K219 Gastro-esophageal reflux disease without esophagitis: Secondary | ICD-10-CM | POA: Diagnosis not present

## 2020-07-12 DIAGNOSIS — Z885 Allergy status to narcotic agent status: Secondary | ICD-10-CM | POA: Insufficient documentation

## 2020-07-12 DIAGNOSIS — H25813 Combined forms of age-related cataract, bilateral: Secondary | ICD-10-CM | POA: Insufficient documentation

## 2020-07-12 DIAGNOSIS — Z882 Allergy status to sulfonamides status: Secondary | ICD-10-CM | POA: Insufficient documentation

## 2020-07-12 DIAGNOSIS — Z8546 Personal history of malignant neoplasm of prostate: Secondary | ICD-10-CM | POA: Diagnosis not present

## 2020-07-12 DIAGNOSIS — H2511 Age-related nuclear cataract, right eye: Secondary | ICD-10-CM | POA: Diagnosis not present

## 2020-07-12 HISTORY — PX: CATARACT EXTRACTION W/PHACO: SHX586

## 2020-07-12 SURGERY — PHACOEMULSIFICATION, CATARACT, WITH IOL INSERTION
Anesthesia: Monitor Anesthesia Care | Site: Eye | Laterality: Right

## 2020-07-12 MED ORDER — EPINEPHRINE PF 1 MG/ML IJ SOLN
INTRAOCULAR | Status: DC | PRN
  Administered 2020-07-12: 500 mL

## 2020-07-12 MED ORDER — TETRACAINE HCL 0.5 % OP SOLN
1.0000 [drp] | OPHTHALMIC | Status: AC | PRN
  Administered 2020-07-12 (×3): 1 [drp] via OPHTHALMIC

## 2020-07-12 MED ORDER — ORAL CARE MOUTH RINSE: 15.0000 mL | Freq: Once | OROMUCOSAL | Status: DC

## 2020-07-12 MED ORDER — DEXAMETHASONE 0.4 MG OP INST
VAGINAL_INSERT | OPHTHALMIC | Status: AC
  Filled 2020-07-12: qty 1

## 2020-07-12 MED ORDER — PROVISC 10 MG/ML IO SOLN
INTRAOCULAR | Status: DC | PRN
  Administered 2020-07-12: 0.85 mL via INTRAOCULAR

## 2020-07-12 MED ORDER — PHENYLEPHRINE-KETOROLAC 1-0.3 % IO SOLN
INTRAOCULAR | Status: AC
  Filled 2020-07-12: qty 4

## 2020-07-12 MED ORDER — LIDOCAINE HCL 3.5 % OP GEL
1.0000 "application " | Freq: Once | OPHTHALMIC | Status: AC
  Administered 2020-07-12: 1 via OPHTHALMIC

## 2020-07-12 MED ORDER — DEXAMETHASONE 0.4 MG OP INST: VAGINAL_INSERT | OPHTHALMIC | Status: DC | PRN

## 2020-07-12 MED ORDER — POVIDONE-IODINE 5 % OP SOLN
OPHTHALMIC | Status: DC | PRN
  Administered 2020-07-12: 1 via OPHTHALMIC

## 2020-07-12 MED ORDER — BSS IO SOLN
INTRAOCULAR | Status: DC | PRN
  Administered 2020-07-12: 15 mL

## 2020-07-12 MED ORDER — LIDOCAINE HCL (PF) 1 % IJ SOLN
INTRAOCULAR | Status: DC | PRN
  Administered 2020-07-12: 1 mL via OPHTHALMIC

## 2020-07-12 MED ORDER — TROPICAMIDE 1 % OP SOLN
1.0000 [drp] | OPHTHALMIC | Status: AC
  Administered 2020-07-12 (×3): 1 [drp] via OPHTHALMIC

## 2020-07-12 MED ORDER — CHLORHEXIDINE GLUCONATE 0.12 % MT SOLN: 15.0000 mL | Freq: Once | OROMUCOSAL | Status: DC

## 2020-07-12 MED ORDER — STERILE WATER FOR IRRIGATION IR SOLN
Status: DC | PRN
  Administered 2020-07-12: 250 mL

## 2020-07-12 MED ORDER — SODIUM HYALURONATE 23 MG/ML IO SOLN
INTRAOCULAR | Status: DC | PRN
  Administered 2020-07-12: 0.6 mL via INTRAOCULAR

## 2020-07-12 MED ORDER — PHENYLEPHRINE HCL 2.5 % OP SOLN
1.0000 [drp] | OPHTHALMIC | Status: AC | PRN
  Administered 2020-07-12 (×3): 1 [drp] via OPHTHALMIC

## 2020-07-12 SURGICAL SUPPLY — 15 items
CLOTH BEACON ORANGE TIMEOUT ST (SAFETY) ×2 IMPLANT
EYE SHIELD UNIVERSAL CLEAR (GAUZE/BANDAGES/DRESSINGS) ×2 IMPLANT
GLOVE SURG UNDER POLY LF SZ6.5 (GLOVE) ×4 IMPLANT
GLOVE SURG UNDER POLY LF SZ7 (GLOVE) ×2 IMPLANT
GOWN STRL REUS W/TWL LRG LVL3 (GOWN DISPOSABLE) ×2 IMPLANT
NDL HYPO 18GX1.5 BLUNT FILL (NEEDLE) IMPLANT
NEEDLE HYPO 18GX1.5 BLUNT FILL (NEEDLE) ×3 IMPLANT
PAD ARMBOARD 7.5X6 YLW CONV (MISCELLANEOUS) ×2 IMPLANT
RING MALYGIN 7.0 (MISCELLANEOUS) IMPLANT
SYR TB 1ML LL NO SAFETY (SYRINGE) ×2 IMPLANT
TAPE SURG TRANSPORE 1 IN (GAUZE/BANDAGES/DRESSINGS) ×1 IMPLANT
TAPE SURGICAL TRANSPORE 1 IN (GAUZE/BANDAGES/DRESSINGS) ×3
TECNIS 1 PIECE IOL (Intraocular Lens) ×2 IMPLANT
VISCOELASTIC ADDITIONAL (OPHTHALMIC RELATED) IMPLANT
WATER STERILE IRR 250ML POUR (IV SOLUTION) ×2 IMPLANT

## 2020-07-12 NOTE — Transfer of Care (Signed)
Immediate Anesthesia Transfer of Care Note  Patient: [name]  Procedure(s) Performed: CATARACT EXTRACTION EXTRACAPSULAR WITH INTRAOCULAR LENS PLACEMENT RIGHT EYE (Right Eye)  Patient Location: Short Stay  Anesthesia Type:MAC  Level of Consciousness: awake, alert  and oriented  Airway & Oxygen Therapy: Patient Spontanous Breathing  Post-op Assessment: Report given to RN and Post -op Vital signs reviewed and stable  Post vital signs: Reviewed and stable  Last Vitals:  Vitals Value Taken Time  BP    Temp    Pulse    Resp    SpO2      Last Pain:  Vitals:   07/12/20 1001  TempSrc: Oral  PainSc: 0-No pain      Patients Stated Pain Goal: 6 (42/[id number]/25 [id number])  Complications: No complications documented.

## 2020-07-12 NOTE — Discharge Instructions (Addendum)
Please discharge patient when stable, will follow up today with Dr. Wrzosek at the [facility] [place] office immediately following discharge.  Leave shield in place until visit.  All paperwork with discharge instructions will be given at the office.  [facility] Address:  [address]  [place], [place] [address]  

## 2020-07-12 NOTE — Interval H&P Note (Signed)
History and Physical Interval Note:  07/12/2020 9:34 AM  [name]  has presented today for surgery, with the diagnosis of Nuclear sclerotic cataract - Right eye.  The various methods of treatment have been discussed with the patient and family. After consideration of risks, benefits and other options for treatment, the patient has consented to  Procedure(s): CATARACT EXTRACTION EXTRACAPSULAR WITH INTRAOCULAR LENS PLACEMENT ([place]) as a surgical intervention.  The patient's history has been reviewed, patient examined, no change in status, stable for surgery.  I have reviewed the patient's chart and labs.  Questions were answered to the patient's satisfaction.     [name]

## 2020-07-12 NOTE — Anesthesia Preprocedure Evaluation (Signed)
Anesthesia Evaluation  Patient identified by MRN, date of birth, ID band Patient awake    Reviewed: Allergy & Precautions, NPO status , Patient's Chart, lab work & pertinent test results, reviewed documented beta blocker date and time   Airway Mallampati: III  TM Distance: >3 FB Neck ROM: Full    Dental  (+) Dental Advisory Given, Caps   Pulmonary sleep apnea , COPD,  COPD inhaler, Current Smoker and Patient abstained from smoking.,    Pulmonary exam normal breath sounds clear to auscultation       Cardiovascular hypertension, Pt. on medications and Pt. on home beta blockers + CAD  Normal cardiovascular exam+ dysrhythmias + Valvular Problems/Murmurs  Rhythm:Regular Rate:Normal     Neuro/Psych PSYCHIATRIC DISORDERS Anxiety Depression    GI/Hepatic GERD  Medicated,  Endo/Other  Morbid obesity  Renal/GU Renal disease   Prostate cancer    Musculoskeletal  (+) Arthritis ,   Abdominal   Peds  Hematology negative hematology ROS (+)   Anesthesia Other Findings   Reproductive/Obstetrics                             Anesthesia Physical Anesthesia Plan  ASA: III  Anesthesia Plan: MAC   Post-op Pain Management:    Induction:   PONV Risk Score and Plan:   Airway Management Planned: Nasal Cannula and Natural Airway  Additional Equipment:   Intra-op Plan:   Post-operative Plan:   Informed Consent: I have reviewed the patients History and Physical, chart, labs and discussed the procedure including the risks, benefits and alternatives for the proposed anesthesia with the patient or authorized representative who has indicated his/her understanding and acceptance.     Dental advisory given  Plan Discussed with: CRNA and Surgeon  Anesthesia Plan Comments:         Anesthesia Quick Evaluation

## 2020-07-12 NOTE — Op Note (Addendum)
Date of procedure: 07/12/20  Pre-operative diagnosis: Visually significant cataract, Right Eye; Poor Dilation, Right Eye (H25.?1; H21.81)  Post-operative diagnosis: Visually significant cataract, Right Eye; Intra-operative Floppy Iris Syndrome, Right Eye  Procedure: Removal of cataract via phacoemulsification and insertion of intra-ocular lens Johnson and Coleta  +19.5D into the capsular bag of the Right Eye (CPT [phone])  Attending surgeon: Gerda Diss. Aiken Withem, MD, MA  Anesthesia: MAC, Topical Akten  Complications: None  Estimated Blood Loss: <68m (minimal)  Specimens: None  Implants: As above  Indications:  Visually significant cataract, Right Eye  Procedure:  The patient was seen and identified in the pre-operative area. The operative eye was identified and dilated.  The operative eye was marked.  Topical anesthesia was administered to the operative eye.     The patient was then to the operative suite and placed in the supine position.  A timeout was performed confirming the patient, procedure to be performed, and all other relevant information.   The patient's face was prepped and draped in the usual fashion for intra-ocular surgery.  A lid speculum was placed into the operative eye and the surgical microscope moved into place and focused.  Poor dilation of the iris was confirmed.  A superotemporal paracentesis was created using a 20 gauge paracentesis blade.  Shugarcaine was injected into the anterior chamber.  Viscoelastic was injected into the anterior chamber.  A temporal clear-corneal main wound incision was created using a 2.410mmicrokeratome.  A Malyugin ring was placed.  A continuous curvilinear capsulorrhexis was initiated using an irrigating cystitome and completed using capsulorrhexis forceps.  Hydrodissection and hydrodeliniation were performed.  Viscoelastic was injected into the anterior chamber.  A phacoemulsification handpiece and a chopper as a second instrument  were used to remove the nucleus and epinucleus. The irrigation/aspiration handpiece was used to remove any remaining cortical material.   The capsular bag was reinflated with viscoelastic, checked, and found to be intact.  The intraocular lens was inserted into the capsular bag and dialed into place using a Kuglen hook.  The Malyugin ring was removed.  The irrigation/aspiration handpiece was used to remove any remaining viscoelastic.  The clear corneal wound and paracentesis wounds were then hydrated and checked with Weck-Cels to be watertight.  The lid-speculum and drape was removed, and the patient's face was cleaned with a wet and dry 4x4. A clear shield was taped over the eye. The patient was taken to the post-operative care unit in good condition, having tolerated the procedure well.  Post-Op Instructions: [facility] and will receive all other orders and instructions.

## 2020-07-12 NOTE — Anesthesia Procedure Notes (Signed)
Procedure Name: MAC Date/Time: 07/12/2020 9:46 AM Performed by: Orlie Dakin, CRNA Pre-anesthesia Checklist: Patient identified, Emergency Drugs available, Suction available and Patient being monitored Patient Re-evaluated:Patient Re-evaluated prior to induction Oxygen Delivery Method: Nasal cannula Placement Confirmation: positive ETCO2

## 2020-07-12 NOTE — Anesthesia Postprocedure Evaluation (Signed)
Anesthesia Post Note  Patient: [name]  Procedure(s) Performed: CATARACT EXTRACTION PHACO AND INTRAOCULAR LENS PLACEMENT RIGHT EYE (Right Eye)  Patient location during evaluation: Phase II Anesthesia Type: MAC Level of consciousness: awake and alert and oriented Pain management: pain level controlled Vital Signs Assessment: post-procedure vital signs reviewed and stable Respiratory status: spontaneous breathing and respiratory function stable Cardiovascular status: blood pressure returned to baseline and stable Postop Assessment: no apparent nausea or vomiting Anesthetic complications: no   No complications documented.   Last Vitals:  Vitals:   07/12/20 0836 07/12/20 1001  BP: (!) 147/93 130/80  Pulse: 69 63  Resp: 17 16  Temp: 36.5 C 36.5 C  SpO2: 97% 96%    Last Pain:  Vitals:   07/12/20 1001  TempSrc: Oral  PainSc: 0-No pain                 [name]

## 2020-07-13 ENCOUNTER — Encounter (HOSPITAL_COMMUNITY): Payer: Self-pay | Admitting: Ophthalmology

## 2020-07-15 ENCOUNTER — Other Ambulatory Visit: Payer: Self-pay | Admitting: Family Medicine

## 2020-07-19 DIAGNOSIS — H25812 Combined forms of age-related cataract, left eye: Secondary | ICD-10-CM | POA: Diagnosis not present

## 2020-07-20 ENCOUNTER — Inpatient Hospital Stay (HOSPITAL_COMMUNITY): Payer: Medicare Other

## 2020-07-20 NOTE — H&P (Signed)
Surgical History & Physical  Patient Name: [name] DOB: Mar 16, 1946  Surgery: Cataract extraction with intraocular lens implant phacoemulsification; Left Eye  Surgeon: [name] MD Surgery Date:  07/26/2020 Pre-Op Date:  07/19/2020  HPI: A 69 Yr. old male patient PO-OD/ Pre-Op OS The patient is returning after cataract surgery (w/ dextenza). The right eye is affected. Status post cataract post-op, which began 1 weeks ago: Onset was 07/12/20. Since the last visit, the affected area is doing well. The patient's vision is improved and stable. Patient is following medication instructions. Taking combo drops TID OD. Pt denies any increase in floaters. The patient complains of nighttime light - car headlights, street lamps etc. glare causing poor vision, which began 1 year ago. The left eye is affected. The episode is gradual. The condition's severity increased since last visit. Symptoms occur when the patient is driving and outside. This is negatively affecting his quality of life. HPI was performed by [name] .  Medical History: Cataracts High Blood Pressure LDL Prostate cancer, GERD, Myopathy  Review of Systems Negative Allergic/Immunologic Negative Cardiovascular Negative Constitutional Negative Ear, Nose, Mouth & Throat Negative Endocrine Negative Eyes Negative Gastrointestinal Negative Genitourinary Negative Hemotologic/Lymphatic Negative Integumentary Negative Musculoskeletal Negative Neurological Negative Psychiatry Negative Respiratory  Social   Current every day smoker   Medication Prednisolone-gatiflox-bromfenac,  Tamsulosin, Allopurinol, Amlodipine besylate, Losartan Potassium, Carvedilol, Duloxetine, Enzalutamide, Gemteza, Nystatin, Pantoprazole, Pregabalin, Rosuvastatin, Tramadol hydrochloride,   Sx/Procedures Phaco c IOL OD,  Knee Replacement,   Drug Allergies  Sulfa, Morphine,   History & Physical: Heent:  Cataract, Left eye NECK: supple  without bruits LUNGS: lungs clear to auscultation CV: regular rate and rhythm Abdomen: soft and non-tender  Impression & Plan: Assessment: 1.  CATARACT EXTRACTION STATUS; Right Eye (Z98.41) 2.  COMBINED FORMS AGE RELATED CATARACT; Both Eyes (H25.813)  Plan: 1.  1 week after cataract surgery. Doing well with improved vision and normal eye pressure. Call with any problems or concerns. Continue Pred-Moxi-Brom 2x/day for 3 more weeks. 2.  Cataract accounts for the patient's decreased vision. This visual impairment is not correctable with a tolerable change in glasses or contact lenses. Cataract surgery with an implantation of a new lens should significantly improve the visual and functional status of the patient. Discussed all risks, benefits, alternatives, and potential complications. Discussed the procedures and recovery. Patient desires to have surgery. A-scan ordered and performed today for intra-ocular lens calculations. The surgery will be performed in order to improve vision for driving, reading, and for eye examinations. Recommend phacoemulsification with intra-ocular lens. Recommend Dextenza for post-operative pain and inflammation. Left Eye. Surgery required to correct imbalance of vision. Dilates poorly - shugacaine by protocol. Malyugin Ring. Omidira.

## 2020-07-22 ENCOUNTER — Encounter (HOSPITAL_COMMUNITY)
Admit: 2020-07-22 | Discharge: 2020-07-22 | Disposition: A | Discharge status: Home / Self Care | Payer: Medicare Other | Attending: Ophthalmology | Admitting: Ophthalmology

## 2020-07-22 ENCOUNTER — Other Ambulatory Visit: Payer: Self-pay

## 2020-07-23 ENCOUNTER — Other Ambulatory Visit (HOSPITAL_COMMUNITY)
Admission: RE | Admit: 2020-07-23 | Discharge: 2020-07-23 | Disposition: A | Discharge status: Home / Self Care | Payer: Medicare Other | Source: Ambulatory Visit | Attending: Ophthalmology | Admitting: Ophthalmology

## 2020-07-23 ENCOUNTER — Other Ambulatory Visit: Payer: Self-pay

## 2020-07-23 DIAGNOSIS — Z20822 Contact with and (suspected) exposure to covid-19: Secondary | ICD-10-CM | POA: Insufficient documentation

## 2020-07-23 DIAGNOSIS — Z01812 Encounter for preprocedural laboratory examination: Secondary | ICD-10-CM | POA: Diagnosis not present

## 2020-07-24 LAB — SARS CORONAVIRUS 2 (TAT 6-24 HRS): SARS Coronavirus 2: NEGATIVE

## 2020-07-26 ENCOUNTER — Encounter (HOSPITAL_COMMUNITY)
Admission: RE | Disposition: A | Discharge status: Home / Self Care | Payer: Self-pay | Source: Ambulatory Visit | Attending: Ophthalmology

## 2020-07-26 ENCOUNTER — Encounter (HOSPITAL_COMMUNITY): Payer: Self-pay | Admitting: Ophthalmology

## 2020-07-26 ENCOUNTER — Ambulatory Visit (HOSPITAL_COMMUNITY)
Admission: RE | Admit: 2020-07-26 | Discharge: 2020-07-26 | Disposition: A | Discharge status: Home / Self Care | Payer: Medicare Other | Source: Ambulatory Visit | Attending: Ophthalmology | Admitting: Ophthalmology

## 2020-07-26 ENCOUNTER — Ambulatory Visit (HOSPITAL_COMMUNITY): Payer: Medicare Other | Admitting: Anesthesiology

## 2020-07-26 DIAGNOSIS — F17200 Nicotine dependence, unspecified, uncomplicated: Secondary | ICD-10-CM | POA: Insufficient documentation

## 2020-07-26 DIAGNOSIS — Z96659 Presence of unspecified artificial knee joint: Secondary | ICD-10-CM | POA: Diagnosis not present

## 2020-07-26 DIAGNOSIS — Z9841 Cataract extraction status, right eye: Secondary | ICD-10-CM | POA: Diagnosis not present

## 2020-07-26 DIAGNOSIS — Z882 Allergy status to sulfonamides status: Secondary | ICD-10-CM | POA: Diagnosis not present

## 2020-07-26 DIAGNOSIS — H25812 Combined forms of age-related cataract, left eye: Secondary | ICD-10-CM | POA: Diagnosis not present

## 2020-07-26 DIAGNOSIS — Z8546 Personal history of malignant neoplasm of prostate: Secondary | ICD-10-CM | POA: Insufficient documentation

## 2020-07-26 DIAGNOSIS — J449 Chronic obstructive pulmonary disease, unspecified: Secondary | ICD-10-CM | POA: Diagnosis not present

## 2020-07-26 DIAGNOSIS — H2512 Age-related nuclear cataract, left eye: Secondary | ICD-10-CM | POA: Diagnosis not present

## 2020-07-26 DIAGNOSIS — Z79899 Other long term (current) drug therapy: Secondary | ICD-10-CM | POA: Diagnosis not present

## 2020-07-26 DIAGNOSIS — H2181 Floppy iris syndrome: Secondary | ICD-10-CM | POA: Diagnosis not present

## 2020-07-26 DIAGNOSIS — Z885 Allergy status to narcotic agent status: Secondary | ICD-10-CM | POA: Insufficient documentation

## 2020-07-26 SURGERY — CATARACT EXTRACTION PHACO AND INTRAOCULAR LENS PLACEMENT (IOC) with placement of Corticosteroid
Anesthesia: Monitor Anesthesia Care | Site: Eye | Laterality: Left

## 2020-07-26 MED ORDER — ORAL CARE MOUTH RINSE: 15.0000 mL | Freq: Once | OROMUCOSAL | Status: DC

## 2020-07-26 MED ORDER — LIDOCAINE 3.5 % OP GEL OPTIME - NO CHARGE
OPHTHALMIC | Status: DC | PRN
  Administered 2020-07-26: 1 [drp] via OPHTHALMIC

## 2020-07-26 MED ORDER — SODIUM HYALURONATE 10 MG/ML IO SOLUTION
PREFILLED_SYRINGE | INTRAOCULAR | Status: DC | PRN
  Administered 2020-07-26: 0.85 mL via INTRAOCULAR

## 2020-07-26 MED ORDER — STERILE WATER FOR IRRIGATION IR SOLN
Status: DC | PRN
  Administered 2020-07-26: 250 mL

## 2020-07-26 MED ORDER — EPINEPHRINE PF 1 MG/ML IJ SOLN
INTRAOCULAR | Status: DC | PRN
  Administered 2020-07-26: 500 mL

## 2020-07-26 MED ORDER — LIDOCAINE HCL (PF) 1 % IJ SOLN
INTRAOCULAR | Status: DC | PRN
  Administered 2020-07-26: 1 mL via OPHTHALMIC

## 2020-07-26 MED ORDER — TETRACAINE HCL 0.5 % OP SOLN
1.0000 [drp] | OPHTHALMIC | Status: AC | PRN
  Administered 2020-07-26 (×3): 1 [drp] via OPHTHALMIC

## 2020-07-26 MED ORDER — SODIUM HYALURONATE 23MG/ML IO SOSY
PREFILLED_SYRINGE | INTRAOCULAR | Status: DC | PRN
  Administered 2020-07-26: 0.6 mL via INTRAOCULAR

## 2020-07-26 MED ORDER — PHENYLEPHRINE HCL 2.5 % OP SOLN
1.0000 [drp] | OPHTHALMIC | Status: AC | PRN
  Administered 2020-07-26 (×3): 1 [drp] via OPHTHALMIC

## 2020-07-26 MED ORDER — TROPICAMIDE 1 % OP SOLN
1.0000 [drp] | OPHTHALMIC | Status: AC
  Administered 2020-07-26 (×3): 1 [drp] via OPHTHALMIC

## 2020-07-26 MED ORDER — POVIDONE-IODINE 5 % OP SOLN
OPHTHALMIC | Status: DC | PRN
  Administered 2020-07-26: 1 via OPHTHALMIC

## 2020-07-26 MED ORDER — BSS IO SOLN
INTRAOCULAR | Status: DC | PRN
  Administered 2020-07-26: 15 mL

## 2020-07-26 MED ORDER — DEXAMETHASONE 0.4 MG OP INST
VAGINAL_INSERT | OPHTHALMIC | Status: AC
  Filled 2020-07-26: qty 1

## 2020-07-26 MED ORDER — CHLORHEXIDINE GLUCONATE 0.12 % MT SOLN: 15.0000 mL | Freq: Once | OROMUCOSAL | Status: DC

## 2020-07-26 MED ORDER — PHENYLEPHRINE-KETOROLAC 1-0.3 % IO SOLN
INTRAOCULAR | Status: AC
  Filled 2020-07-26: qty 4

## 2020-07-26 MED ORDER — EPINEPHRINE PF 1 MG/ML IJ SOLN
INTRAMUSCULAR | Status: AC
  Filled 2020-07-26: qty 1

## 2020-07-26 SURGICAL SUPPLY — 13 items
CLOTH BEACON ORANGE TIMEOUT ST (SAFETY) ×1 IMPLANT
EYE SHIELD UNIVERSAL CLEAR (GAUZE/BANDAGES/DRESSINGS) ×1 IMPLANT
GLOVE SURG UNDER POLY LF SZ7 (GLOVE) ×2 IMPLANT
NDL HYPO 18GX1.5 BLUNT FILL (NEEDLE) IMPLANT
NEEDLE HYPO 18GX1.5 BLUNT FILL (NEEDLE) ×2 IMPLANT
PAD ARMBOARD 7.5X6 YLW CONV (MISCELLANEOUS) ×1 IMPLANT
RING MALYGIN 7.0 (MISCELLANEOUS) IMPLANT
SYR TB 1ML LL NO SAFETY (SYRINGE) ×1 IMPLANT
TAPE SURG TRANSPORE 1 IN (GAUZE/BANDAGES/DRESSINGS) IMPLANT
TAPE SURGICAL TRANSPORE 1 IN (GAUZE/BANDAGES/DRESSINGS) ×2
TECNIS 1 PIECE IOL (Intraocular Lens) ×1 IMPLANT
VISCOELASTIC ADDITIONAL (OPHTHALMIC RELATED) IMPLANT
WATER STERILE IRR 250ML POUR (IV SOLUTION) ×1 IMPLANT

## 2020-07-26 NOTE — Anesthesia Preprocedure Evaluation (Signed)
Anesthesia Evaluation  Patient identified by MRN, date of birth, ID band Patient awake    Reviewed: Allergy & Precautions, NPO status , Patient's Chart, lab work & pertinent test results, reviewed documented beta blocker date and time   Airway Mallampati: III  TM Distance: >3 FB Neck ROM: Full    Dental  (+) Dental Advisory Given, Caps   Pulmonary sleep apnea , COPD,  COPD inhaler, Current Smoker and Patient abstained from smoking.,    Pulmonary exam normal breath sounds clear to auscultation       Cardiovascular hypertension, Pt. on medications and Pt. on home beta blockers + CAD  Normal cardiovascular exam+ dysrhythmias + Valvular Problems/Murmurs  Rhythm:Regular Rate:Normal     Neuro/Psych PSYCHIATRIC DISORDERS Anxiety Depression    GI/Hepatic GERD  Medicated,  Endo/Other  Morbid obesity  Renal/GU Renal disease   Prostate cancer    Musculoskeletal  (+) Arthritis ,   Abdominal   Peds  Hematology negative hematology ROS (+)   Anesthesia Other Findings   Reproductive/Obstetrics                             Anesthesia Physical Anesthesia Plan  ASA: III  Anesthesia Plan: MAC   Post-op Pain Management:    Induction:   PONV Risk Score and Plan:   Airway Management Planned: Nasal Cannula and Natural Airway  Additional Equipment:   Intra-op Plan:   Post-operative Plan:   Informed Consent: I have reviewed the patients History and Physical, chart, labs and discussed the procedure including the risks, benefits and alternatives for the proposed anesthesia with the patient or authorized representative who has indicated his/her understanding and acceptance.     Dental advisory given  Plan Discussed with: CRNA and Surgeon  Anesthesia Plan Comments:         Anesthesia Quick Evaluation  

## 2020-07-26 NOTE — Anesthesia Postprocedure Evaluation (Signed)
Anesthesia Post Note  Patient: [name]  Procedure(s) Performed: CATARACT EXTRACTION PHACO AND INTRAOCULAR LENS PLACEMENT LEFT EYE (Left Eye)  Patient location during evaluation: Short Stay Anesthesia Type: MAC Level of consciousness: awake and alert and oriented Pain management: pain level controlled Vital Signs Assessment: post-procedure vital signs reviewed and stable Respiratory status: spontaneous breathing Cardiovascular status: blood pressure returned to baseline and stable Postop Assessment: no apparent nausea or vomiting Anesthetic complications: no   No complications documented.   Last Vitals:  Vitals:   07/26/20 1017  BP: 131/60  Pulse: (!) 54  Resp: 18  Temp: 36.7 C  SpO2: 95%    Last Pain:  Vitals:   07/26/20 1017  TempSrc: Axillary  PainSc: 0-No pain                 [name]

## 2020-07-26 NOTE — Op Note (Signed)
Date of procedure: 07/26/20  Pre-operative diagnosis: Visually significant combined-form age-related cataract, Left Eye; Poor dilation, Left eye (H25.?2; H21.81)  Post-operative diagnosis: Visually significant cataract, Left Eye; Intra-operative Floppy Iris Syndrome, Left Eye  Procedure: Complex removal of cataract via phacoemulsification and insertion of intra-ocular lens Johnson and [place]  +19.5D into the capsular bag of the Left Eye (CPT [phone])  Attending surgeon: Gerda Diss. Jacarie Pate, MD, MA  Anesthesia: MAC, Topical Akten  Complications: None  Estimated Blood Loss: <62m (minimal)  Specimens: None  Implants: As above  Indications:  Visually significant cataract, Left Eye  Procedure:  The patient was seen and identified in the pre-operative area. The operative eye was identified and dilated.  The operative eye was marked.  Topical anesthesia was administered to the operative eye.     The patient was then to the operative suite and placed in the supine position.  A timeout was performed confirming the patient, procedure to be performed, and all other relevant information.   The patient's face was prepped and draped in the usual fashion for intra-ocular surgery.  A lid speculum was placed into the operative eye and the surgical microscope moved into place and focused.  Poor dilation of the iris was confirmed.  An inferotemporal paracentesis was created using a 20 gauge paracentesis blade.  Shugarcaine was injected into the anterior chamber.  Viscoelastic was injected into the anterior chamber.  A temporal clear-corneal main wound incision was created using a 2.44mmicrokeratome.  A Malyugin ring was placed.  A continuous curvilinear capsulorrhexis was initiated using an irrigating cystitome and completed using capsulorrhexis forceps.  Hydrodissection and hydrodeliniation were performed.  Viscoelastic was injected into the anterior chamber.  A phacoemulsification handpiece and a  chopper as a second instrument were used to remove the nucleus and epinucleus. The irrigation/aspiration handpiece was used to remove any remaining cortical material.   The capsular bag was reinflated with viscoelastic, checked, and found to be intact.  The intraocular lens was inserted into the capsular bag and dialed into place using a Kuglen hook. The Malyugin ring was removed.  The irrigation/aspiration handpiece was used to remove any remaining viscoelastic.  The clear corneal wound and paracentesis wounds were then hydrated and checked with Weck-Cels to be watertight.  The lid-speculum and drape was removed, and the patient's face was cleaned with a wet and dry 4x4.  A clear shield was taped over the eye. The patient was taken to the post-operative care unit in good condition, having tolerated the procedure well.  Post-Op Instructions: [facility] and will receive all other orders and instructions.

## 2020-07-26 NOTE — Transfer of Care (Signed)
Immediate Anesthesia Transfer of Care Note  Patient: [name]  Procedure(s) Performed: CATARACT EXTRACTION PHACO AND INTRAOCULAR LENS PLACEMENT LEFT EYE (Left Eye)  Patient Location: Short Stay  Anesthesia Type:MAC  Level of Consciousness: awake  Airway & Oxygen Therapy: Patient Spontanous Breathing  Post-op Assessment: Report given to RN  Post vital signs: Reviewed and stable  Last Vitals:  Vitals Value Taken Time  BP 131/60 07/26/20 1017  Temp 36.7 C 07/26/20 1017  Pulse 54 07/26/20 1017  Resp 18 07/26/20 1017  SpO2 95 % 07/26/20 1017    Last Pain:  Vitals:   07/26/20 1017  TempSrc: Axillary  PainSc: 0-No pain         Complications: No complications documented.

## 2020-07-26 NOTE — Interval H&P Note (Signed)
History and Physical Interval Note:  07/26/2020 9:42 AM  [name]  has presented today for surgery, with the diagnosis of Nuclear sclerotic cataract - Left eye.  The various methods of treatment have been discussed with the patient and family. After consideration of risks, benefits and other options for treatment, the patient has consented to  Procedure(s) with comments: CATARACT EXTRACTION PHACO AND INTRAOCULAR LENS PLACEMENT LEFT EYE (Left) - left as a surgical intervention.  The patient's history has been reviewed, patient examined, no change in status, stable for surgery.  I have reviewed the patient's chart and labs.  Questions were answered to the patient's satisfaction.     [name]

## 2020-07-26 NOTE — Discharge Instructions (Signed)
Please discharge patient when stable, will follow up today with Dr. Kriston Pasquarello at the [facility] [place] office immediately following discharge.  Leave shield in place until visit.  All paperwork with discharge instructions will be given at the office.  [facility] [place] Address:  [address]  , [place] [address]  

## 2020-07-27 ENCOUNTER — Ambulatory Visit (HOSPITAL_COMMUNITY): Payer: Medicare Other | Admitting: Hematology

## 2020-07-29 ENCOUNTER — Other Ambulatory Visit: Payer: Self-pay | Admitting: Family Medicine

## 2020-07-29 ENCOUNTER — Encounter: Payer: Self-pay | Admitting: Urology

## 2020-07-29 ENCOUNTER — Other Ambulatory Visit: Payer: Self-pay

## 2020-07-29 ENCOUNTER — Ambulatory Visit (INDEPENDENT_AMBULATORY_CARE_PROVIDER_SITE_OTHER): Payer: Medicare Other | Admitting: Urology

## 2020-07-29 VITALS — BP 103/68 | HR 65 | Temp 97.8°F | Ht 68.0 in | Wt 280.0 lb

## 2020-07-29 DIAGNOSIS — C775 Secondary and unspecified malignant neoplasm of intrapelvic lymph nodes: Secondary | ICD-10-CM

## 2020-07-29 DIAGNOSIS — C61 Malignant neoplasm of prostate: Secondary | ICD-10-CM

## 2020-07-29 DIAGNOSIS — N3941 Urge incontinence: Secondary | ICD-10-CM | POA: Diagnosis not present

## 2020-07-29 DIAGNOSIS — R351 Nocturia: Secondary | ICD-10-CM

## 2020-07-29 DIAGNOSIS — I10 Essential (primary) hypertension: Secondary | ICD-10-CM

## 2020-07-29 MED ORDER — LEUPROLIDE ACETATE (6 MONTH) 45 MG ~~LOC~~ KIT
45.0000 mg | PACK | Freq: Once | SUBCUTANEOUS | Status: AC
  Administered 2020-07-29: 45 mg via SUBCUTANEOUS

## 2020-07-29 MED ORDER — LEUPROLIDE ACETATE (6 MONTH) 45 MG IM KIT: 45.0000 mg | PACK | Freq: Once | INTRAMUSCULAR | Status: DC

## 2020-07-29 NOTE — Addendum Note (Signed)
Addended by: Valentina Lucks on: 07/29/2020 05:09 PM   Modules accepted: Orders

## 2020-07-29 NOTE — Progress Notes (Signed)
Urological Symptom Review  Patient is experiencing the following symptoms: Frequent urination Hard to postpone urination Get up at night to urinate Leakage of urine Erection problems  Review of Systems  Gastrointestinal (upper)  : Negative for upper GI symptoms  Gastrointestinal (lower) : Negative for lower GI symptoms  Constitutional : Negative for symptoms  Skin: Negative for skin symptoms  Eyes: Negative for eye symptoms  Ear/Nose/Throat : Negative for Ear/Nose/Throat symptoms  Hematologic/Lymphatic: Negative for Hematologic/Lymphatic symptoms  Cardiovascular : Negative for cardiovascular symptoms  Respiratory : Negative for respiratory symptoms  Endocrine: Negative for endocrine symptoms  Musculoskeletal: Back pain  Neurological: Negative for neurological symptoms  Psychologic: Negative for psychiatric symptoms

## 2020-07-29 NOTE — Progress Notes (Signed)
Subjective:  1. Prostate cancer ([place])   2. 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 Urge incontinence   4. Nocturia     07/29/20: [name] returns today in f/u for his history noted below.  He remains on Lupron and Xtandi.  The Myrbetriq didn't help.  He remains on tamsulosin.  He is due for Lupron today.  He has chronic back pain and has some concerns about memory which can accompany the ADT and particularly with the Xtandi.    04/29/20: [name] returns in f/u for his locally recurrent prostate cancer now on ADT. His PSA was up to 1.3 on ADT with a testosterone of <10 in 12/20 then 3.2 in 4/21  And 4.2 with a T of <3 in 7/21.   He remains on Lupron and was started on Xtandi  by Dr. Delton Coombes.  His PSA has fallen further to 0.3 from  0.81 since his last visit.    He continues to have nocturia q1.5hrs.   He has bilateral ankle edema.  He has UUI and wears pads.  He is back on tamsulosin but didn't benefit from taking Gemtesa.  He had some benefit with Myrbetriq in the past but got constipated with it.  His weight is stable. He has had no bone pain but he has knee pain and has difficulty ambulating.  He was going to see Dr. Maureen Ralphs but apparently has not yet.  His UA has 3-10 RBC's.    A PET prior to initiation of the firmagon for a rising PSA that increased to 4.0 with a <3 month PSADT showed local uptake on the PET in the prostate with no mets. He had some pulmonary nodules and a CHest was obtained and 3-6 mo f/u imaging was recommended. The CT Chest was done on 08/12/18 and f/u in 6-12 months was recommended. He is doing well without hot flashes or other complaints. He continues to have nocturia q1.5hrs which may be somewhat better since his PCP added tamsulosin. He has some daytime frequency q2-3hrs. He has intermittency and a sensation of incomplete emptying. He has a variable stream. His IPSS is 21.  He has CKD of 2.16 on recent labs.  His UA had 3-10 RBC's on 4/1.   A renal US in 8/20 didn't  show any stones.  He has cysts and some atrophy.   He has a history of T3 N1M0 Gleason 8 prostate cancer with metastatic adenopathy in the right iliac chain. He was diagnosed on 09/26/11. He completed radiation therapy in late October 2015. He has been off of the ADT since 3/18 and his PSA has been rising and is up to 4.0 from 1.6 in 9/19 and from 0.81 on 09/24/17 with a normalized testosterone.   He had cystoscopy and ureteroscopy in June 2019 for gross hematuria from the left collecting system. He has no associated signs or symptoms.     IPSS    Row Name 07/29/20 1100         International Prostate Symptom Score   How often have you had the sensation of not emptying your bladder? About half the time     How often have you had to urinate less than every two hours? More than half the time     How often have you found you stopped and started again several times when you urinated? Less than 1 in 5 times     How often have you found it difficult to postpone urination? About  half the time     How often have you had a weak urinary stream? Less than half the time     How often have you had to strain to start urination? Less than 1 in 5 times     How many times did you typically get up at night to urinate? 5 Times     Total IPSS Score 19           Quality of Life due to urinary symptoms   If you were to spend the rest of your life with your urinary condition just the way it is now how would you feel about that? Mostly Disatisfied             ROS:  ROS:  A complete review of systems was performed.  All systems are negative except for pertinent findings as noted.   ROS  Allergies  Allergen Reactions  . Carbidopa-Levodopa Other (See Comments)    hallunications  . Morphine Sulfate Nausea And Vomiting  . Sulfa Antibiotics Nausea And Vomiting  . Sulfacetamide Sodium Nausea And Vomiting    Outpatient Encounter Medications as of 07/29/2020  Medication Sig  . allopurinol (ZYLOPRIM) 300  MG tablet Take 1 tablet by mouth once daily  . amLODipine (NORVASC) 10 MG tablet Take 1 tablet (10 mg total) by mouth daily.  . carvedilol (COREG) 25 MG tablet Take 1 tablet by mouth twice daily  . DULoxetine (CYMBALTA) 60 MG capsule Take 1 capsule (60 mg total) by mouth daily. Take 1 capsule by mouth once daily (Needs to be seen before next refill)  . losartan (COZAAR) 50 MG tablet Take 1 tablet (50 mg total) by mouth daily.  . mirabegron ER (MYRBETRIQ) 25 MG TB24 tablet Take 1 tablet (25 mg total) by mouth daily.  [name] nystatin (MYCOSTATIN/NYSTOP) powder Apply 1 application topically 3 (three) times daily.  . pantoprazole (PROTONIX) 40 MG tablet Take 40 mg by mouth 2 (two) times daily.  . pregabalin (LYRICA) 25 MG capsule Take 1 capsule by mouth twice daily  . tamsulosin (FLOMAX) 0.4 MG CAPS capsule Take 1 capsule (0.4 mg total) by mouth daily.  . traMADol (ULTRAM) 50 MG tablet Take 1 tablet (50 mg total) by mouth daily as needed.  Gillermina Phy 40 MG tablet TAKE 3 TABLETS BY MOUTH ONCE DAILY AT THE SAME TIME. MAY TAKE WITH OR WITHOUT FOOD.  [name] rosuvastatin (CRESTOR) 20 MG tablet Take 1 tablet (20 mg total) by mouth daily.  . [EXPIRED] leuprolide (6 Month) (ELIGARD) injection 45 mg   . [DISCONTINUED] Leuprolide Acetate (6 Month) (LUPRON) injection 45 mg    No facility-administered encounter medications on file as of 07/29/2020.    Past Medical History:  Diagnosis Date  . Aortic atherosclerosis ([place])    2004  s/p  closure Penetrating atherosclerotic ulcer of infarenal aorta w/ endovascular stent graft  . Arthritis   . CKD (chronic kidney disease), stage III ([place])   . Closed fracture of head of humerus 07/2017   right shoulder from fall; 09-25-2017 per pt no surgical intervention, only wore sling, intermittant pain  . Coronary atherosclerosis of native coronary artery    positive myoview for ischemia 09-27-2009;  10-01-2009 per cardiac cath-- minimal nonobstructive CAD w/ 30% LAD   . ED (erectile  dysfunction)   . Emphysema/COPD [facility])    followed by pcp--- last exacerbation 09-12-2017;  09-25-2017 per pt no cough, sob or congestion  . Essential hypertension   . Family history of kidney cancer   .  First degree heart block   . Frequency of urination   . GERD (gastroesophageal reflux disease)   . Gout    09-25-2017  per pt stable,  last episode 2015  . Heart murmur   . History of adenomatous polyp of colon   . History of closed head injury 2005   per pt residual resolved  . History of external beam radiation therapy    completed 10/ 2015 for prostate cancer  . History of gastric ulcer 2014  . History of kidney stones   . History of pneumothorax    spontaneous pneumo treated w/ chest tube  . History of rib fracture 07/2017   from fall,  right side 6th,7th,8th  . Neuropathy   . OAB (overactive bladder)   . OSA (obstructive sleep apnea)    Intolerant of CPAP  . Prostate cancer ([place])    dx 09-26-2011 via bx-- Stage T3b,N0,  Gleason 4+4, PSA 11.2 with METs to external iliac lymph node(resolved with ADT)-- treated w/ ADT ;   05/2013 staging work-up for rising PSA , started casodex and completed radiation therapy 10/ 2015;   rising PSA post treatment  . RLS (restless legs syndrome)   . Ureteral neocystostomy bleed    left side  . Urge urinary incontinence     Past Surgical History:  Procedure Laterality Date  . ABDOMINAL AORTIC ENDOVASCULAR STENT GRAFT  09-13-2007    dr [name]  [facility]   closure penetrating atherosclerotic ulcer of infarenal aorta with stent graft  . BIOPSY  03/06/2019   Procedure: BIOPSY;  Surgeon: [name], MD;  [facility];  Service: Endoscopy;;  gastric  . CARDIAC CATHETERIZATION  2005   per pt normal (done in [place])  . CARDIAC CATHETERIZATION  10/06/2009    dr cooper   minimal nonobstructive CAD w/30% LAD otherwise normal coronaries, LVEDP 2mmHg  . CARDIOVASCULAR STRESS TEST  09/27/2009    dr Aundra Dubin   lexiscan nuclear study w/  moderate reversible inferior perfusion defect ischemia,  ef 60% (cardiac cath scheduled)  . CATARACT EXTRACTION W/PHACO Right 07/12/2020   Procedure: CATARACT EXTRACTION PHACO AND INTRAOCULAR LENS PLACEMENT RIGHT EYE;  Surgeon: [name], MD;  Location: [facility] OR[facility];  Service: Ophthalmology;  Laterality: Right;  CDE=15.48  . CHEST TUBE INSERTION  1989   "collapsed lung"due to injury  . CIRCUMCISION  09/26/2011   Procedure: CIRCUMCISION ADULT;  Surgeon: [name], MD;  Location: [facility];  Service: Urology;  Laterality: N/A;  . COLONOSCOPY W/ POLYPECTOMY    . COLONOSCOPY WITH PROPOFOL N/A 04/27/2016    eight 4-8 mm polyps in descending colon, at splenic flexure, in ascending colon and cecum. Tubular adenomas. Surveillance in 3 years.  . CYSTOSCOPY  09/26/2011   Procedure: CYSTOSCOPY;  Surgeon: [name], MD;  Location: [facility];  Service: Urology;  Laterality: N/A;  . CYSTOSCOPY/RETROGRADE/URETEROSCOPY Left 09/27/2017   Procedure: CYSTOSCOPYLEFT Marlow Baars AND RENAL WASHINGS;  Surgeon: [name], MD;  Location[facility];  Service: Urology;  Laterality: Left;  . ESOPHAGOGASTRODUODENOSCOPY (EGD) WITH PROPOFOL N/A 04/27/2016   normal esophagus s/p dilation, small hiatal hernia, normal duodenum  . ESOPHAGOGASTRODUODENOSCOPY (EGD) WITH PROPOFOL N/A 03/06/2019   erosive reflux esophagitis, s/p dilation, normal duodenum, abnormal gastric mucosa s/p biopsy. Hyperplastic gastric polyp. No H.pylori.   [name] EXTRACORPOREAL SHOCK WAVE LITHOTRIPSY  yrs ago  . FRACTURE SURGERY  child   Bilateral lower arms   . KNEE ARTHROSCOPY Right 2013  . MALONEY DILATION N/A 04/27/2016  Procedure: MALONEY DILATION;  Surgeon: [name], MD;  [facility];  Service: Endoscopy;  Laterality: N/A;  . Venia Minks DILATION N/A 03/06/2019   Procedure: Venia Minks DILATION;  Surgeon: [name], MD;  [facility];  Service: Endoscopy;  Laterality: N/A;  . PARTIAL KNEE ARTHROPLASTY  Right 04/26/2015   Procedure: RIGHT KNEE MEDIAL UNICOMPARTMENTAL ARTHROPLASTY;  Surgeon: Gaynelle Arabian, MD;  [facility];  Service: Orthopedics;  Laterality: Right;  . POLYPECTOMY  04/27/2016   Procedure: POLYPECTOMY;  Surgeon: [name], MD;  [facility];  Service: Endoscopy;;  cecal , ascending, descending, and sigmoid polypectomies  . PROSTATE BIOPSY  09/26/2011   Procedure: BIOPSY TRANSRECTAL ULTRASONIC PROSTATE (TUBP);  Surgeon: [name], MD;  [facility];  Service: Urology;  Laterality: N/A;     . TRANSTHORACIC ECHOCARDIOGRAM  01-07-2013   dr [name]   ef 60-65%,  grade 1 diastolic dysfunction/  mild AR without stenosis/  mild LAE/  mild to moderate calcificed MV annulus without regurg. or stenosis/  . UMBILICAL HERNIA REPAIR  yrs ago    Social History   Socioeconomic History  . Marital status: Married    Spouse name: Not on file  . Number of children: 3  . Years of education: 34  . Highest education level: Some college, no degree  Occupational History  . Occupation: Retired    Comment: Hotel manager    Comment: Part Time at [employer]  . Smoking status: Current Every Day Smoker    Packs/day: 0.50    Years: 42.00    Pack years: 21.00    Types: Cigarettes  . Smokeless tobacco: Never Used  Vaping Use  . Vaping Use: Never used  Substance and Sexual Activity  . Alcohol use: No    Alcohol/week: 0.0 standard drinks  . Drug use: No  . Sexual activity: Yes    Birth control/protection: None  Other Topics Concern  . Not on file  Social History Narrative   Patient is retired from the department of [employer]. He lives in a one story home with his wife. He moved from [place] [facility] about 9 years ago. They do not have family on the [place] and although he is very outgoing he feels socially isolated. He isn't a member of any groups or churches.    Social Determinants of Health   Financial Resource Strain: High Risk  . Difficulty of  Paying Living Expenses: Hard  Food Insecurity: Not on file  Transportation Needs: No Transportation Needs  . Lack of Transportation (Medical): No  . Lack of Transportation (Non-Medical): No  Physical Activity: Not on file  Stress: Not on file  Social Connections: Not on file  Intimate Partner Violence: Not At Risk  . Fear of Current or Ex-Partner: No  . Emotionally Abused: No  . Physically Abused: No  . Sexually Abused: No    Family History  Problem Relation Age of Onset  . Stroke Mother   . Alzheimer's disease Mother 80       probably due to head injury  . Mesothelioma Father 21  . Kidney cancer Father   . Hyperlipidemia Sister   . Heart attack Sister   . Kidney cancer Sister        dx in her 51s  . Heart disease Brother   . Kidney cancer Brother 26       dx in his 25s  . Hyperlipidemia Brother   .  Kidney cancer Brother        dx in his 91s  . Healthy Daughter   . Healthy Son   . Healthy Son   . Colon cancer Neg Hx        Objective: Vitals:   07/29/20 1151  BP: 103/68  Pulse: 65  Temp: 97.8 F (36.6 C)     Physical Exam  Lab Results:  Recent Results (from the past 2160 hour(s))  Novel Coronavirus, NAA ([facility])     Status: None   Collection Time: 06/18/20  3:22 PM   Specimen: Nasopharyngeal(NP) swabs in vial transport medium  Result Value Ref Range   SARS-CoV-2, NAA Not Detected Not Detected    Comment: This nucleic acid amplification test was developed and its performance characteristics determined by [employer], [employer] and [employer]. Nucleic acid amplification tests include RT-PCR and TMA. This test has not been FDA cleared or approved. This test has been authorized by FDA under an Emergency Use Authorization (EUA). This test is only authorized for the duration of time the declaration that circumstances exist justifying the authorization of the emergency use of in vitro diagnostic tests for detection of SARS-CoV-2 virus and/or diagnosis of COVID-19 infection  under section 564(b)(1) of the Act, 21 U.S.C. 016WFU-9(N) (1), unless the authorization is terminated or revoked sooner. When diagnostic testing is negative, the possibility of a false negative result should be considered in the context of a patient's recent exposures and the presence of clinical signs and symptoms consistent with COVID-19. An individual without symptoms of COVID-19 and who is not shedding SARS-CoV-2 virus wo uld expect to have a negative (not detected) result in this assay.   SARS-COV-2, NAA 2 DAY TAT     Status: None   Collection Time: 06/18/20  3:22 PM  Result Value Ref Range   SARS-CoV-2, NAA 2 DAY TAT Performed   SARS CORONAVIRUS 2 (TAT 6-24 HRS) Nasopharyngeal Nasopharyngeal Swab     Status: None   Collection Time: 07/09/20  1:30 PM   Specimen: Nasopharyngeal Swab  Result Value Ref Range   SARS Coronavirus 2 NEGATIVE NEGATIVE    Comment: (NOTE) SARS-CoV-2 target nucleic acids are NOT DETECTED.  The SARS-CoV-2 RNA is generally detectable in upper and lower respiratory specimens during the acute phase of infection. Negative results do not preclude SARS-CoV-2 infection, do not rule out co-infections with other pathogens, and should not be used as the sole basis for treatment or other patient management decisions. Negative results must be combined with clinical observations, patient history, and epidemiological information. The expected result is Negative.  Fact Sheet for Patients: SugarRoll.be  Fact Sheet for Healthcare Providers: https://www.woods-mathews.com/  This test is not yet approved or cleared by the [place] FDA and  has been authorized for detection and/or diagnosis of SARS-CoV-2 by FDA under an Emergency Use Authorization (EUA). This EUA will remain  in effect (meaning this test can be used) for the duration of the COVID-19 declaration under Se ction 564(b)(1) of the Act, 21 U.S.C. section  360bbb-3(b)(1), unless the authorization is terminated or revoked sooner.  Performed at [facility], [place] [address], [place], [place] [address]   SARS CORONAVIRUS 2 (TAT 6-24 HRS) Nasopharyngeal Nasopharyngeal Swab     Status: None   Collection Time: 07/23/20 12:01 PM   Specimen: Nasopharyngeal Swab  Result Value Ref Range   SARS Coronavirus 2 NEGATIVE NEGATIVE    Comment: (NOTE) SARS-CoV-2 target nucleic acids are NOT DETECTED.  The SARS-CoV-2 RNA is generally detectable in  upper and lower respiratory specimens during the acute phase of infection. Negative results do not preclude SARS-CoV-2 infection, do not rule out co-infections with other pathogens, and should not be used as the sole basis for treatment or other patient management decisions. Negative results must be combined with clinical observations, patient history, and epidemiological information. The expected result is Negative.  Fact Sheet for Patients: SugarRoll.be  Fact Sheet for Healthcare Providers: https://www.woods-mathews.com/  This test is not yet approved or cleared by the [place] FDA and  has been authorized for detection and/or diagnosis of SARS-CoV-2 by FDA under an Emergency Use Authorization (EUA). This EUA will remain  in effect (meaning this test can be used) for the duration of the COVID-19 declaration under Se ction 564(b)(1) of the Act, 21 U.S.C. section 360bbb-3(b)(1), unless the authorization is terminated or revoked sooner.  Performed at [facility], [place] [address], [place], [place] [address]    04/14/20 PSA 0.30. 01/19/20 PSA 0.81  Lab Results  Component Value Date   PSA1 4.2 (H) 11/14/2019   Lab Results  Component Value Date   PSA 3.2 07/18/2019   PSA 0.37 10/01/2013   PSA 0.27 07/09/2013    Studies/Results:  Assessment & Plan: [place] Resistant metastatic prostate cancer with a good PSA response to [place]. Lupron  45mg  given today.   He will return in 3 months.  Nocturia and Urge incontinence.   He didn't respond to the [place] and is out of that.   Continue tamsulosin.  Itried  Myrbetriq 25mg  again but it didn't help.         Meds ordered this encounter  Medications  . DISCONTD: Leuprolide Acetate (6 Month) (LUPRON) injection 45 mg  . leuprolide (6 Month) (ELIGARD) injection 45 mg     Orders Placed This Encounter  Procedures  . Urinalysis, Routine w reflex microscopic      Return in about 3 months (around 10/28/2020).   CC: [name], [name], MD      [name] 07/29/2020 Patient ID: [name], male   DOB: April 26, 1945, 75 y.o.   MRN: [id number]

## 2020-08-03 ENCOUNTER — Other Ambulatory Visit: Payer: Self-pay

## 2020-08-03 ENCOUNTER — Telehealth (HOSPITAL_COMMUNITY): Payer: Self-pay | Admitting: Clinical

## 2020-08-03 ENCOUNTER — Ambulatory Visit (HOSPITAL_COMMUNITY): Payer: Medicare Other | Admitting: Clinical

## 2020-08-03 NOTE — Telephone Encounter (Signed)
The patient answered for this scheduled session, however, noted he is currently under the weather/sick and would call back in to reschedule.

## 2020-08-10 ENCOUNTER — Inpatient Hospital Stay (HOSPITAL_COMMUNITY): Payer: Medicare Other

## 2020-08-16 ENCOUNTER — Inpatient Hospital Stay (HOSPITAL_COMMUNITY): Payer: Medicare Other | Attending: Hematology

## 2020-08-16 ENCOUNTER — Other Ambulatory Visit: Payer: Self-pay | Admitting: Cardiology

## 2020-08-16 ENCOUNTER — Other Ambulatory Visit: Payer: Self-pay

## 2020-08-16 DIAGNOSIS — N3281 Overactive bladder: Secondary | ICD-10-CM | POA: Diagnosis not present

## 2020-08-16 DIAGNOSIS — C61 Malignant neoplasm of prostate: Secondary | ICD-10-CM | POA: Insufficient documentation

## 2020-08-16 DIAGNOSIS — N189 Chronic kidney disease, unspecified: Secondary | ICD-10-CM | POA: Diagnosis not present

## 2020-08-16 DIAGNOSIS — F17210 Nicotine dependence, cigarettes, uncomplicated: Secondary | ICD-10-CM | POA: Insufficient documentation

## 2020-08-16 DIAGNOSIS — G629 Polyneuropathy, unspecified: Secondary | ICD-10-CM | POA: Insufficient documentation

## 2020-08-16 DIAGNOSIS — Z923 Personal history of irradiation: Secondary | ICD-10-CM | POA: Insufficient documentation

## 2020-08-16 LAB — CBC WITH DIFFERENTIAL/PLATELET
Abs Immature Granulocytes: 0.02 10*3/uL (ref 0.00–0.07)
Basophils Absolute: 0 10*3/uL (ref 0.0–0.1)
Basophils Relative: 1 %
Eosinophils Absolute: 0.3 10*3/uL (ref 0.0–0.5)
Eosinophils Relative: 5 %
HCT: 34 % — ABNORMAL LOW (ref 39.0–52.0)
Hemoglobin: 11.1 g/dL — ABNORMAL LOW (ref 13.0–17.0)
Immature Granulocytes: 0 %
Lymphocytes Relative: 11 %
Lymphs Abs: 0.7 10*3/uL (ref 0.7–4.0)
MCH: 30.7 pg (ref 26.0–34.0)
MCHC: 32.6 g/dL (ref 30.0–36.0)
MCV: 93.9 fL (ref 80.0–100.0)
Monocytes Absolute: 0.4 10*3/uL (ref 0.1–1.0)
Monocytes Relative: 6 %
Neutro Abs: 4.7 10*3/uL (ref 1.7–7.7)
Neutrophils Relative %: 77 %
Platelets: 169 10*3/uL (ref 150–400)
RBC: 3.62 MIL/uL — ABNORMAL LOW (ref 4.22–5.81)
RDW: 15.1 % (ref 11.5–15.5)
WBC: 6.1 10*3/uL (ref 4.0–10.5)
nRBC: 0 % (ref 0.0–0.2)

## 2020-08-16 LAB — COMPREHENSIVE METABOLIC PANEL
ALT: 9 U/L (ref 0–44)
AST: 10 U/L — ABNORMAL LOW (ref 15–41)
Albumin: 3.2 g/dL — ABNORMAL LOW (ref 3.5–5.0)
Alkaline Phosphatase: 55 U/L (ref 38–126)
Anion gap: 7 (ref 5–15)
BUN: 20 mg/dL (ref 8–23)
CO2: 24 mmol/L (ref 22–32)
Calcium: 8.2 mg/dL — ABNORMAL LOW (ref 8.9–10.3)
Chloride: 106 mmol/L (ref 98–111)
Creatinine, Ser: 2.15 mg/dL — ABNORMAL HIGH (ref 0.61–1.24)
GFR, Estimated: 31 mL/min — ABNORMAL LOW (ref >60)
Glucose, Bld: 108 mg/dL — ABNORMAL HIGH (ref 70–99)
Potassium: 3.5 mmol/L (ref 3.5–5.1)
Sodium: 137 mmol/L (ref 135–145)
Total Bilirubin: 0.7 mg/dL (ref 0.3–1.2)
Total Protein: 6.5 g/dL (ref 6.5–8.1)

## 2020-08-16 LAB — PSA: Prostatic Specific Antigen: 0.44 ng/mL (ref 0.00–4.00)

## 2020-08-17 ENCOUNTER — Ambulatory Visit (HOSPITAL_COMMUNITY): Payer: Medicare Other | Admitting: Hematology

## 2020-08-17 LAB — TESTOSTERONE: Testosterone: 10 ng/dL — ABNORMAL LOW (ref 264–916)

## 2020-08-20 ENCOUNTER — Encounter (HOSPITAL_COMMUNITY): Payer: Self-pay | Admitting: Hematology

## 2020-08-20 ENCOUNTER — Inpatient Hospital Stay (HOSPITAL_BASED_OUTPATIENT_CLINIC_OR_DEPARTMENT_OTHER): Payer: Medicare Other | Admitting: Hematology

## 2020-08-20 ENCOUNTER — Other Ambulatory Visit: Payer: Self-pay

## 2020-08-20 DIAGNOSIS — Z1589 Genetic susceptibility to other disease: Secondary | ICD-10-CM | POA: Diagnosis not present

## 2020-08-20 DIAGNOSIS — C61 Malignant neoplasm of prostate: Secondary | ICD-10-CM | POA: Diagnosis not present

## 2020-08-20 DIAGNOSIS — Z923 Personal history of irradiation: Secondary | ICD-10-CM | POA: Diagnosis not present

## 2020-08-20 DIAGNOSIS — G629 Polyneuropathy, unspecified: Secondary | ICD-10-CM | POA: Diagnosis not present

## 2020-08-20 DIAGNOSIS — Z1379 Encounter for other screening for genetic and chromosomal anomalies: Secondary | ICD-10-CM

## 2020-08-20 DIAGNOSIS — N189 Chronic kidney disease, unspecified: Secondary | ICD-10-CM | POA: Diagnosis not present

## 2020-08-20 DIAGNOSIS — N3281 Overactive bladder: Secondary | ICD-10-CM | POA: Diagnosis not present

## 2020-08-20 DIAGNOSIS — F17210 Nicotine dependence, cigarettes, uncomplicated: Secondary | ICD-10-CM | POA: Diagnosis not present

## 2020-08-20 MED ORDER — ABIRATERONE ACETATE 250 MG PO TABS: 1000.0000 mg | ORAL_TABLET | Freq: Every day | ORAL | 3 refills | Status: DC

## 2020-08-20 MED ORDER — PREDNISONE 5 MG PO TABS: 5.0000 mg | ORAL_TABLET | Freq: Every day | ORAL | 3 refills | Status: DC

## 2020-08-20 NOTE — Progress Notes (Signed)
[place], [place] [address]   CLINIC:  Medical Oncology/Hematology  PCP:  [name], [name], MD [place] / [place] [address] [phone]   REASON FOR VISIT:  Follow-up for recurrent prostate cancer  PRIOR THERAPY: External beam radiation therapy with bicalutamide  NGS Results: Not done  CURRENT THERAPY: Enzalutamide 120 mg QD  BRIEF ONCOLOGIC HISTORY:  Oncology History   No history exists.    CANCER STAGING: Cancer Staging Prostate cancer [facility]) Staging form: Prostate, AJCC 7th Edition - Clinical: Stage IV (pT3b, N1, M0) - Signed by Wyatt Portela, MD on 07/09/2013   INTERVAL HISTORY:  [name], a 74 y.o. male, seen for follow-up of metastatic prostate cancer.  [name] is taking enzalutamide at 2 PM in the afternoon daily.  [name] reports occasional falls.  [name] reports that [name] always had a history of falls even prior to start of enzalutamide.  However [name] reported some memory problems since the start of enzalutamide.  Fatigue has been stable.  REVIEW OF SYSTEMS:  Review of Systems  Constitutional: Positive for fatigue (50%). Negative for appetite change.  Respiratory: Positive for shortness of breath (On exertion).   Neurological: Positive for numbness (feet). Negative for extremity weakness.  All other systems reviewed and are negative.   PAST MEDICAL/SURGICAL HISTORY:  Past Medical History:  Diagnosis Date  . Aortic atherosclerosis ([place])    2004  s/p  closure Penetrating atherosclerotic ulcer of infarenal aorta w/ endovascular stent graft  . Arthritis   . CKD (chronic kidney disease), stage III ([place])   . Closed fracture of head of humerus 07/2017   right shoulder from fall; 09-25-2017 per pt no surgical intervention, only wore sling, intermittant pain  . Coronary atherosclerosis of native coronary artery    positive myoview for ischemia 09-27-2009;  10-01-2009 per cardiac cath-- minimal nonobstructive CAD w/ 30%  LAD   . ED (erectile dysfunction)   . Emphysema/COPD [facility])    followed by pcp--- last exacerbation 09-12-2017;  09-25-2017 per pt no cough, sob or congestion  . Essential hypertension   . Family history of kidney cancer   . First degree heart block   . Frequency of urination   . GERD (gastroesophageal reflux disease)   . Gout    09-25-2017  per pt stable,  last episode 2015  . Heart murmur   . History of adenomatous polyp of colon   . History of closed head injury 2005   per pt residual resolved  . History of external beam radiation therapy    completed 10/ 2015 for prostate cancer  . History of gastric ulcer 2014  . History of kidney stones   . History of pneumothorax    spontaneous pneumo treated w/ chest tube  . History of rib fracture 07/2017   from fall,  right side 6th,7th,8th  . Neuropathy   . OAB (overactive bladder)   . OSA (obstructive sleep apnea)    Intolerant of CPAP  . Prostate cancer ([place])    dx 09-26-2011 via bx-- Stage T3b,N0,  Gleason 4+4, PSA 11.2 with METs to external iliac lymph node(resolved with ADT)-- treated w/ ADT ;   05/2013 staging work-up for rising PSA , started casodex and completed radiation therapy 10/ 2015;   rising PSA post treatment  . RLS (restless legs syndrome)   . Ureteral neocystostomy bleed    left side  . Urge urinary incontinence    Past Surgical History:  Procedure Laterality Date  . ABDOMINAL AORTIC ENDOVASCULAR STENT GRAFT  09-13-2007    dr [name]  [facility]   closure penetrating atherosclerotic ulcer of infarenal aorta with stent graft  . BIOPSY  03/06/2019   Procedure: BIOPSY;  Surgeon: [name], MD;  Location: [facility] SU[facility];  Service: Endoscopy;;  gastric  . CARDIAC CATHETERIZATION  2005   per pt normal (done in [place])  . CARDIAC CATHETERIZATION  10/06/2009    dr cooper   minimal nonobstructive CAD w/30% LAD otherwise normal coronaries, LVEDP 17mmHg  . CARDIOVASCULAR STRESS TEST  09/27/2009    dr Aundra Dubin   lexiscan  nuclear study w/ moderate reversible inferior perfusion defect ischemia,  ef 60% (cardiac cath scheduled)  . CATARACT EXTRACTION W/PHACO Right 07/12/2020   Procedure: CATARACT EXTRACTION PHACO AND INTRAOCULAR LENS PLACEMENT RIGHT EYE;  Surgeon: [name], MD;  Location: [facility] ORS;  Service: Ophthalmology;  Laterality: Right;  CDE=15.48  . CHEST TUBE INSERTION  1989   "collapsed lung"due to injury  . CIRCUMCISION  09/26/2011   Procedure: CIRCUMCISION ADULT;  Surgeon: [name], MD;  Location: [facility] ORS;  Service: Urology;  Laterality: N/A;  . COLONOSCOPY W/ POLYPECTOMY    . COLONOSCOPY WITH PROPOFOL N/A 04/27/2016    eight 4-8 mm polyps in descending colon, at splenic flexure, in ascending colon and cecum. Tubular adenomas. Surveillance in 3 years.  . CYSTOSCOPY  09/26/2011   Procedure: CYSTOSCOPY;  Surgeon: [name], MD;  Location: [facility] ORS;  Service: Urology;  Laterality: N/A;  . CYSTOSCOPY/RETROGRADE/URETEROSCOPY Left 09/27/2017   Procedure: CYSTOSCOPYLEFT Marlow Baars AND RENAL WASHINGS;  Surgeon: [name], MD;  Location: [facility];  Service: Urology;  Laterality: Left;  . ESOPHAGOGASTRODUODENOSCOPY (EGD) WITH PROPOFOL N/A 04/27/2016   normal esophagus s/p dilation, small hiatal hernia, normal duodenum  . ESOPHAGOGASTRODUODENOSCOPY (EGD) WITH PROPOFOL N/A 03/06/2019   erosive reflux esophagitis, s/p dilation, normal duodenum, abnormal gastric mucosa s/p biopsy. Hyperplastic gastric polyp. No H.pylori.   [name] EXTRACORPOREAL SHOCK WAVE LITHOTRIPSY  yrs ago  . FRACTURE SURGERY  child   Bilateral lower arms   . KNEE ARTHROSCOPY Right 2013  . MALONEY DILATION N/A 04/27/2016   Procedure: Venia Minks DILATION;  Surgeon: [name], MD;  [facility];  Service: Endoscopy;  Laterality: N/A;  . Venia Minks DILATION N/A 03/06/2019   Procedure: Venia Minks DILATION;  Surgeon: [name], MD;  [facility];  Service: Endoscopy;  Laterality: N/A;  . PARTIAL  KNEE ARTHROPLASTY Right 04/26/2015   Procedure: RIGHT KNEE MEDIAL UNICOMPARTMENTAL ARTHROPLASTY;  Surgeon: Gaynelle Arabian, MD;  Location: [facility];  Service: Orthopedics;  Laterality: Right;  . POLYPECTOMY  04/27/2016   Procedure: POLYPECTOMY;  Surgeon: [name], MD;  [facility];  Service: Endoscopy;;  cecal , ascending, descending, and sigmoid polypectomies  . PROSTATE BIOPSY  09/26/2011   Procedure: BIOPSY TRANSRECTAL ULTRASONIC PROSTATE (TUBP);  Surgeon: [name], MD;  Location: [facility];  Service: Urology;  Laterality: N/A;     . TRANSTHORACIC ECHOCARDIOGRAM  01-07-2013   dr [name]   ef 60-65%,  grade 1 diastolic dysfunction/  mild AR without stenosis/  mild LAE/  mild to moderate calcificed MV annulus without regurg. or stenosis/  . UMBILICAL HERNIA REPAIR  yrs ago    SOCIAL HISTORY:  Social History   Socioeconomic History  . Marital status: Married    Spouse name: Not on file  . Number of children: 3  . Years of education: 101  .  Highest education level: Some college, no degree  Occupational History  . Occupation: Retired    Comment: Hotel manager    Comment: Part Time at [employer]  . Smoking status: Current Every Day Smoker    Packs/day: 0.50    Years: 42.00    Pack years: 21.00    Types: Cigarettes  . Smokeless tobacco: Never Used  Vaping Use  . Vaping Use: Never used  Substance and Sexual Activity  . Alcohol use: No    Alcohol/week: 0.0 standard drinks  . Drug use: No  . Sexual activity: Yes    Birth control/protection: None  Other Topics Concern  . Not on file  Social History Narrative   Patient is retired from the department of [employer]. [name] lives in a one story home with his wife. [name] moved from [facility] about 9 years ago. They do not have family on the [place] and although [name] is very outgoing [name] feels socially isolated. [name] isn't a member of any groups or churches.    Social Determinants of Health   Financial Resource  Strain: High Risk  . Difficulty of Paying Living Expenses: Hard  Food Insecurity: Not on file  Transportation Needs: No Transportation Needs  . Lack of Transportation (Medical): No  . Lack of Transportation (Non-Medical): No  Physical Activity: Not on file  Stress: Not on file  Social Connections: Not on file  Intimate Partner Violence: Not At Risk  . Fear of Current or Ex-Partner: No  . Emotionally Abused: No  . Physically Abused: No  . Sexually Abused: No    FAMILY HISTORY:  Family History  Problem Relation Age of Onset  . Stroke Mother   . Alzheimer's disease Mother 32       probably due to head injury  . Mesothelioma Father 108  . Kidney cancer Father   . Hyperlipidemia Sister   . Heart attack Sister   . Kidney cancer Sister        dx in her 48s  . Heart disease Brother   . Kidney cancer Brother 24       dx in his 53s  . Hyperlipidemia Brother   . Kidney cancer Brother        dx in his 63s  . Healthy Daughter   . Healthy Son   . Healthy Son   . Colon cancer Neg Hx     CURRENT MEDICATIONS:  Current Outpatient Medications  Medication Sig Dispense Refill  . abiraterone acetate (ZYTIGA) 250 MG tablet Take 4 tablets (1,000 mg total) by mouth daily. Take on an empty stomach 1 hour before or 2 hours after a meal 120 tablet 3  . allopurinol (ZYLOPRIM) 300 MG tablet Take 1 tablet by mouth once daily 90 tablet 0  . amLODipine (NORVASC) 10 MG tablet Take 1 tablet by mouth once daily 90 tablet 0  . carvedilol (COREG) 25 MG tablet Take 1 tablet by mouth twice daily 180 tablet 0  . DULoxetine (CYMBALTA) 60 MG capsule Take 1 capsule (60 mg total) by mouth daily. Take 1 capsule by mouth once daily (Needs to be seen before next refill) 90 capsule 3  . losartan (COZAAR) 50 MG tablet Take 1 tablet (50 mg total) by mouth daily. 90 tablet 3  . mirabegron ER (MYRBETRIQ) 25 MG TB24 tablet Take 1 tablet (25 mg total) by mouth daily. 28 tablet 0  . nystatin (MYCOSTATIN/NYSTOP) powder  Apply 1 application topically 3 (three) times daily.  15 g 0  . pantoprazole (PROTONIX) 40 MG tablet Take 40 mg by mouth 2 (two) times daily.    . predniSONE (DELTASONE) 5 MG tablet Take 1 tablet (5 mg total) by mouth daily with breakfast. 90 tablet 3  . pregabalin (LYRICA) 25 MG capsule Take 1 capsule by mouth twice daily 60 capsule 3  . rosuvastatin (CRESTOR) 20 MG tablet TAKE 1 TABLET BY MOUTH ONCE DAILY - STOP PRAVASTATIN 30 tablet 0  . tamsulosin (FLOMAX) 0.4 MG CAPS capsule Take 1 capsule (0.4 mg total) by mouth daily. 90 capsule 3  . traMADol (ULTRAM) 50 MG tablet Take 1 tablet (50 mg total) by mouth daily as needed. 30 tablet 1   No current facility-administered medications for this visit.    ALLERGIES:  Allergies  Allergen Reactions  . Carbidopa-Levodopa Other (See Comments)    hallunications  . Morphine Sulfate Nausea And Vomiting  . Sulfa Antibiotics Nausea And Vomiting  . Sulfacetamide Sodium Nausea And Vomiting    PHYSICAL EXAM:  Performance status (ECOG): 1 - Symptomatic but completely ambulatory  There were no vitals filed for this visit. Wt Readings from Last 3 Encounters:  07/29/20 280 lb (127 kg)  07/06/20 270 lb (122.5 kg)  05/19/20 278 lb (126.1 kg)   Physical Exam Vitals reviewed.  Constitutional:      Appearance: Normal appearance. [name] is obese.  Cardiovascular:     Rate and Rhythm: Normal rate and regular rhythm.     Heart sounds: Normal heart sounds.  Pulmonary:     Effort: Pulmonary effort is normal.     Breath sounds: Normal breath sounds.  Abdominal:     Palpations: Abdomen is soft.  Musculoskeletal:     Right lower leg: No edema.     Left lower leg: No edema.  Neurological:     General: No focal deficit present.     Mental Status: [name] is alert and oriented to person, place, and time.  Psychiatric:        Mood and Affect: Mood normal.        Behavior: Behavior normal.      LABORATORY DATA:  I have reviewed the labs as listed.  CBC Latest  Ref Rng & Units 08/16/2020 04/14/2020 02/11/2020  WBC 4.0 - 10.5 K/uL 6.1 5.8 6.3  Hemoglobin 13.0 - 17.0 g/dL 11.1(L) 11.3(L) 11.3(L)  Hematocrit 39.0 - 52.0 % 34.0(L) 34.5(L) 35.1(L)  Platelets 150 - 400 K/uL 169 188 208   CMP Latest Ref Rng & Units 08/16/2020 04/14/2020 02/11/2020  Glucose 70 - 99 mg/dL 108(H) 107(H) 110(H)  BUN 8 - 23 mg/dL 20 26(H) 23  Creatinine 0.61 - 1.24 mg/dL 2.15(H) 2.20(H) 2.22(H)  Sodium 135 - 145 mmol/L 137 137 137  Potassium 3.5 - 5.1 mmol/L 3.5 3.5 3.5  Chloride 98 - 111 mmol/L 106 104 105  CO2 22 - 32 mmol/L 24 26 23   Calcium 8.9 - 10.3 mg/dL 8.2(L) 8.4(L) 8.2(L)  Total Protein 6.5 - 8.1 g/dL 6.5 6.8 7.1  Total Bilirubin 0.3 - 1.2 mg/dL 0.7 0.5 0.6  Alkaline Phos 38 - 126 U/L 55 57 64  AST 15 - 41 U/L 10(L) 12(L) 13(L)  ALT 0 - 44 U/L 9 10 12    PSA 0.30 04/14/2020  PSA 0.81 01/19/2020  PSA 4.2 (H) 11/14/2019    DIAGNOSTIC IMAGING:  I have independently reviewed the scans and discussed with the patient. No results found.   ASSESSMENT:  1. Castration resistant prostate cancer: -T3 N1 M0 Gleason  4+4=8 prostate cancer with metastatic adenopathy in the right iliac chain diagnosed on 09/26/2011, PSA 11.2, treated with ADT only and PSA nadired at around 0.12 in February 2014. Subsequently slowly rising PSA to 0.32 in October 2014 and 0.66 in February 2015. Staging scans in March 2015 -. [name] was treated with salvage radiation therapy with bicalutamide. ADT was continued until March 2018. Staging work-up at diagnosis was negative for bone mets. -PSA 0.81 (09/24/2017), 1.6 (9/19), 1.3 (03/2019) 3.2 (07/18/2019), 4.2 (11/14/2019), testosterone less than 3. -Fluciclovine PET scan on 08/07/2019 showed asymmetric activity in the right prostate gland representing residual carcinoma with no evidence of metastatic adenopathy and no skeletal lesions. -Last Lupron injection 45 mg on 07/18/2019. -Fluciclovine PET scan on 12/18/2019 shows persistent hypermetabolic involving  the prostate, slightly increased on the left. Similar left and slight increase in right inguinal nodal low-level hypermetabolism, favored to be reactive but warranting follow-up. Increased hypermetabolic within the enlarging thoracic lymph nodes, cannot exclude atypical distribution of nodal metastasis. -Enzalutamide started around 01/07/2020, discontinued on 08/20/2020. - Abiraterone with prednisone started on 08/20/2020.  2. Social/family history: -Half pack per day current active smoker for 2 years. -Father and 2 brothers had kidney cancer.   PLAN:  1.Metastatic castration resistant prostate cancer: -[name] is taking enzalutamide 120 mg daily. - We reviewed his labs today which showed PSA is 0.44, up from 0.3.  LFTs are normal. - [name] is having memory problems with enzalutamide. - Apalutamide and darolutamide are not approved in this setting. - I have recommended switching to Abiraterone with prednisone.  Continue Lupron with Dr. Roni Bread. - We reviewed side effects in detail.  We will start him on Abiraterone 750 mg daily and titrated up to full dose.  We will start him on prednisone 5 mg daily. - RTC 1 month for follow-up.  2. Neuropathy: -Continue Lyrica 25 mg twice daily.  3.  Overactive bladder: -Continue Flomax.  Gemtesa and the Myrbetriq did not help. - Continue follow-up with Dr. Roni Bread.  4.  CKD: - Creatinine is stable around 2.1.  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 - We will check plasma fractionated metanephrines at next visit. - We will consider imaging with a CT or PET scan if metanephrines are elevated.  Orders placed this encounter:  Orders Placed This Encounter  Procedures  . PSA  . Comprehensive metabolic panel  . CBC with Differential/Platelet  . Testosterone  . Metanephrines, plasma   Total time spent is 40 minutes with more than 50% of  the time spent face-to-face discussing new treatment plan, counseling and coordination of care.  Derek Jack, MD [place] [phone]   I, Milinda Antis, am acting as a scribe for Dr. Sanda Linger.  I, Derek Jack MD, have reviewed the above documentation for accuracy and completeness, and I agree with the above.

## 2020-08-23 ENCOUNTER — Telehealth (HOSPITAL_COMMUNITY): Payer: Self-pay | Admitting: Pharmacy Technician

## 2020-08-23 NOTE — Telephone Encounter (Signed)
Oral Oncology Patient Advocate Encounter  Received notification from [facility] FEP) that prior authorization for Fabio Asa is required.  PA submitted on CoverMyMeds Key [id number]  Status is pending  Oral Oncology Clinic will continue to follow.  [place] Patient [place] Phone [phone] Fax [phone] 08/23/2020 12:49 PM

## 2020-08-24 ENCOUNTER — Telehealth: Payer: Self-pay | Admitting: Pharmacist

## 2020-08-24 NOTE — Telephone Encounter (Signed)
Oral Oncology Pharmacist Encounter  Received new prescription for Zytiga (abiraterone) for the treatment of metastatic castration resistant prostate cancer in conjunction with prednisone and ADT, planned duration until disease progression or unacceptable drug toxicity. Patient is transitioning from [place] to [place] due to memory issues with Xtandi   CMP from 08/16/20 assessed, no relevant lab abnormalities. Prescription dose and frequency assessed. MD plan on starting him on abiraterone 750mg  daily, then tolerating up to 1000mg  if tolerated.  Current medication list in Epic reviewed, several DDIs with abiraterone identified: - Abiraterone may increase the serum concentration of carvedilol and tamsulosin. Monitor patient for increased adverse effects of the listed medications. No baseline dose adjustment needed.  - Tramadol: abiraterone may decreased the concentration of the active metabolites of tramadol, monitor patient for decreased pain control. No baseline dose adjustment needed.   Evaluated chart and no patient barriers to medication adherence identified.  Prescription has been e-scribed to [facility] for benefits analysis and approval.  Oral Oncology Clinic will continue to follow for insurance authorization, copayment issues, initial counseling and start date.  Darl Pikes, PharmD, BCPS, BCOP, CPP Hematology/Oncology Clinical Pharmacist Practitioner [facility] [place] [facility] [phone]  08/24/2020 9:01 AM

## 2020-08-24 NOTE — Telephone Encounter (Signed)
Oral Oncology Pharmacist Encounter   Prior Authorization for abiraterone has been approved.     PA# [id number] Effective dates: 07/24/20 through 08/23/21   Oral Oncology Clinic will continue to follow.   Darl Pikes, PharmD, BCPS. BCOP Hematology/Oncology Clinical Pharmacist [facility] [phone]  08/24/2020 9:30 AM

## 2020-09-15 ENCOUNTER — Other Ambulatory Visit: Payer: Self-pay | Admitting: Family Medicine

## 2020-09-15 DIAGNOSIS — I10 Essential (primary) hypertension: Secondary | ICD-10-CM

## 2020-09-20 ENCOUNTER — Other Ambulatory Visit: Payer: Self-pay

## 2020-09-20 ENCOUNTER — Encounter (HOSPITAL_COMMUNITY): Payer: Self-pay

## 2020-09-20 ENCOUNTER — Inpatient Hospital Stay (HOSPITAL_COMMUNITY): Payer: Medicare Other | Attending: Hematology

## 2020-09-20 ENCOUNTER — Other Ambulatory Visit (HOSPITAL_COMMUNITY): Payer: Self-pay

## 2020-09-20 ENCOUNTER — Other Ambulatory Visit (HOSPITAL_COMMUNITY): Payer: Self-pay | Admitting: Physician Assistant

## 2020-09-20 DIAGNOSIS — C775 Secondary and unspecified malignant neoplasm of intrapelvic lymph nodes: Secondary | ICD-10-CM | POA: Insufficient documentation

## 2020-09-20 DIAGNOSIS — N183 Chronic kidney disease, stage 3 unspecified: Secondary | ICD-10-CM | POA: Insufficient documentation

## 2020-09-20 DIAGNOSIS — F17210 Nicotine dependence, cigarettes, uncomplicated: Secondary | ICD-10-CM | POA: Diagnosis not present

## 2020-09-20 DIAGNOSIS — I10 Essential (primary) hypertension: Secondary | ICD-10-CM | POA: Diagnosis not present

## 2020-09-20 DIAGNOSIS — G2581 Restless legs syndrome: Secondary | ICD-10-CM | POA: Insufficient documentation

## 2020-09-20 DIAGNOSIS — C61 Malignant neoplasm of prostate: Secondary | ICD-10-CM

## 2020-09-20 DIAGNOSIS — Z79899 Other long term (current) drug therapy: Secondary | ICD-10-CM | POA: Insufficient documentation

## 2020-09-20 DIAGNOSIS — G4733 Obstructive sleep apnea (adult) (pediatric): Secondary | ICD-10-CM | POA: Insufficient documentation

## 2020-09-20 DIAGNOSIS — Z1379 Encounter for other screening for genetic and chromosomal anomalies: Secondary | ICD-10-CM

## 2020-09-20 DIAGNOSIS — Z1589 Genetic susceptibility to other disease: Secondary | ICD-10-CM

## 2020-09-20 DIAGNOSIS — E876 Hypokalemia: Secondary | ICD-10-CM | POA: Diagnosis not present

## 2020-09-20 DIAGNOSIS — Z7952 Long term (current) use of systemic steroids: Secondary | ICD-10-CM | POA: Diagnosis not present

## 2020-09-20 DIAGNOSIS — G629 Polyneuropathy, unspecified: Secondary | ICD-10-CM | POA: Diagnosis not present

## 2020-09-20 LAB — CBC WITH DIFFERENTIAL/PLATELET
Abs Immature Granulocytes: 0.02 10*3/uL (ref 0.00–0.07)
Basophils Absolute: 0 10*3/uL (ref 0.0–0.1)
Basophils Relative: 0 %
Eosinophils Absolute: 0.4 10*3/uL (ref 0.0–0.5)
Eosinophils Relative: 5 %
HCT: 35.3 % — ABNORMAL LOW (ref 39.0–52.0)
Hemoglobin: 11.6 g/dL — ABNORMAL LOW (ref 13.0–17.0)
Immature Granulocytes: 0 %
Lymphocytes Relative: 10 %
Lymphs Abs: 0.7 10*3/uL (ref 0.7–4.0)
MCH: 31 pg (ref 26.0–34.0)
MCHC: 32.9 g/dL (ref 30.0–36.0)
MCV: 94.4 fL (ref 80.0–100.0)
Monocytes Absolute: 0.5 10*3/uL (ref 0.1–1.0)
Monocytes Relative: 6 %
Neutro Abs: 5.7 10*3/uL (ref 1.7–7.7)
Neutrophils Relative %: 79 %
Platelets: 169 10*3/uL (ref 150–400)
RBC: 3.74 MIL/uL — ABNORMAL LOW (ref 4.22–5.81)
RDW: 14.6 % (ref 11.5–15.5)
WBC: 7.2 10*3/uL (ref 4.0–10.5)
nRBC: 0 % (ref 0.0–0.2)

## 2020-09-20 LAB — COMPREHENSIVE METABOLIC PANEL
ALT: 9 U/L (ref 0–44)
AST: 12 U/L — ABNORMAL LOW (ref 15–41)
Albumin: 3.5 g/dL (ref 3.5–5.0)
Alkaline Phosphatase: 68 U/L (ref 38–126)
Anion gap: 10 (ref 5–15)
BUN: 23 mg/dL (ref 8–23)
CO2: 23 mmol/L (ref 22–32)
Calcium: 7.9 mg/dL — ABNORMAL LOW (ref 8.9–10.3)
Chloride: 104 mmol/L (ref 98–111)
Creatinine, Ser: 2.34 mg/dL — ABNORMAL HIGH (ref 0.61–1.24)
GFR, Estimated: 28 mL/min — ABNORMAL LOW (ref >60)
Glucose, Bld: 103 mg/dL — ABNORMAL HIGH (ref 70–99)
Potassium: 2.6 mmol/L — CL (ref 3.5–5.1)
Sodium: 137 mmol/L (ref 135–145)
Total Bilirubin: 0.8 mg/dL (ref 0.3–1.2)
Total Protein: 6.8 g/dL (ref 6.5–8.1)

## 2020-09-20 LAB — PSA: Prostatic Specific Antigen: 0.39 ng/mL (ref 0.00–4.00)

## 2020-09-20 MED ORDER — POTASSIUM CHLORIDE CRYS ER 20 MEQ PO TBCR: EXTENDED_RELEASE_TABLET | ORAL | 0 refills | Status: DC

## 2020-09-20 NOTE — Progress Notes (Signed)
Critical Potassium 2.6  Tarri Abernethy, PA made aware, awaiting further instructions.

## 2020-09-20 NOTE — Progress Notes (Signed)
Per Tarri Abernethy, PA patient is to take 40 mEq twice daily x1 day, followed by 40 mEq daily x3 days. Repeat CMP on Friday. I called the patient and relayed this information, he verbalized understanding.

## 2020-09-20 NOTE — Progress Notes (Signed)
Hypokalemia noted with potassium 2.6.  Prescription for potassium chloride sent to [facility] with instructions to take 40 mEq twice daily x1 day followed by 40 mEq daily x3 days.  Repeat CMP on Friday.

## 2020-09-21 ENCOUNTER — Other Ambulatory Visit (HOSPITAL_COMMUNITY): Payer: Federal, State, Local not specified - PPO

## 2020-09-21 LAB — TESTOSTERONE: Testosterone: <3 ng/dL — ABNORMAL LOW (ref 264–916)

## 2020-09-23 ENCOUNTER — Other Ambulatory Visit (HOSPITAL_COMMUNITY): Payer: Self-pay

## 2020-09-24 ENCOUNTER — Inpatient Hospital Stay (HOSPITAL_COMMUNITY): Payer: Medicare Other

## 2020-09-24 ENCOUNTER — Other Ambulatory Visit: Payer: Self-pay

## 2020-09-24 ENCOUNTER — Encounter (HOSPITAL_COMMUNITY): Payer: Self-pay

## 2020-09-24 ENCOUNTER — Other Ambulatory Visit (HOSPITAL_COMMUNITY): Payer: Self-pay | Admitting: Physician Assistant

## 2020-09-24 DIAGNOSIS — G2581 Restless legs syndrome: Secondary | ICD-10-CM | POA: Diagnosis not present

## 2020-09-24 DIAGNOSIS — E876 Hypokalemia: Secondary | ICD-10-CM

## 2020-09-24 DIAGNOSIS — C775 Secondary and unspecified malignant neoplasm of intrapelvic lymph nodes: Secondary | ICD-10-CM | POA: Diagnosis not present

## 2020-09-24 DIAGNOSIS — C61 Malignant neoplasm of prostate: Secondary | ICD-10-CM | POA: Diagnosis not present

## 2020-09-24 DIAGNOSIS — G4733 Obstructive sleep apnea (adult) (pediatric): Secondary | ICD-10-CM | POA: Diagnosis not present

## 2020-09-24 DIAGNOSIS — G629 Polyneuropathy, unspecified: Secondary | ICD-10-CM | POA: Diagnosis not present

## 2020-09-24 DIAGNOSIS — Z1589 Genetic susceptibility to other disease: Secondary | ICD-10-CM

## 2020-09-24 LAB — COMPREHENSIVE METABOLIC PANEL
ALT: 10 U/L (ref 0–44)
AST: 13 U/L — ABNORMAL LOW (ref 15–41)
Albumin: 3.3 g/dL — ABNORMAL LOW (ref 3.5–5.0)
Alkaline Phosphatase: 65 U/L (ref 38–126)
Anion gap: 6 (ref 5–15)
BUN: 23 mg/dL (ref 8–23)
CO2: 22 mmol/L (ref 22–32)
Calcium: 8.3 mg/dL — ABNORMAL LOW (ref 8.9–10.3)
Chloride: 107 mmol/L (ref 98–111)
Creatinine, Ser: 2.13 mg/dL — ABNORMAL HIGH (ref 0.61–1.24)
GFR, Estimated: 32 mL/min — ABNORMAL LOW (ref >60)
Glucose, Bld: 119 mg/dL — ABNORMAL HIGH (ref 70–99)
Potassium: 3.3 mmol/L — ABNORMAL LOW (ref 3.5–5.1)
Sodium: 135 mmol/L (ref 135–145)
Total Bilirubin: 0.7 mg/dL (ref 0.3–1.2)
Total Protein: 6.4 g/dL — ABNORMAL LOW (ref 6.5–8.1)

## 2020-09-24 MED ORDER — POTASSIUM CHLORIDE CRYS ER 20 MEQ PO TBCR: 20.0000 meq | EXTENDED_RELEASE_TABLET | Freq: Two times a day (BID) | ORAL | 3 refills | Status: DC

## 2020-09-24 NOTE — Progress Notes (Signed)
Note from [place], [place]- I reviewed his repeat CMP. 30-day Rx sent for potassium - 20 mEq daily with repeat BMP in 1 week.  Can you notify patient? TWICE daily (20 in the AM, 20 PM - 40 mEq total per day).  Patient is aware and agreeable with plan.

## 2020-09-27 ENCOUNTER — Inpatient Hospital Stay (HOSPITAL_BASED_OUTPATIENT_CLINIC_OR_DEPARTMENT_OTHER): Payer: Medicare Other | Admitting: Oncology

## 2020-09-27 ENCOUNTER — Other Ambulatory Visit: Payer: Self-pay

## 2020-09-27 DIAGNOSIS — C61 Malignant neoplasm of prostate: Secondary | ICD-10-CM

## 2020-09-27 NOTE — Patient Instructions (Addendum)
[place] at [facility] Discharge Instructions  You were seen today by Dr. Grayland Ormond. He discussed your prostate medication and the side effects you are having. He wants you to start taking the prednisone. Your lab results look good Potassium is only a little low so continue taking the Potassium twice daily. Your PSA level is going down.    Thank you for choosing [place] at [facility] to provide your oncology and hematology care.  To afford each patient quality time with our provider, please arrive at least 15 minutes before your scheduled appointment time.   If you have a lab appointment with the [place] please come in thru the Main Entrance and check in at the main information desk.  You need to re-schedule your appointment should you arrive 10 or more minutes late.  We strive to give you quality time with our providers, and arriving late affects you and other patients whose appointments are after yours.  Also, if you no show three or more times for appointments you may be dismissed from the clinic at the providers discretion.     Again, thank you for choosing [facility].  Our hope is that these requests will decrease the amount of time that you wait before being seen by our physicians.       _____________________________________________________________  Should you have questions after your visit to [facility], please contact our office at [phone] and follow the prompts.  Our office hours are 8:00 a.m. and 4:30 p.m. Monday - Friday.  Please note that voicemails left after 4:00 p.m. may not be returned until the following business day.  We are closed weekends and major holidays.  You do have access to a nurse 24-7, just call the main number to the clinic [phone] and do not press any options, hold on the line and a nurse will answer the phone.    For prescription refill requests, have your pharmacy  contact our office and allow 72 hours.    Due to Covid, you will need to wear a mask upon entering the hospital. If you do not have a mask, a mask will be given to you at the Main Entrance upon arrival. For doctor visits, patients may have 1 support person age 42 or older with them. For treatment visits, patients can not have anyone with them due to social distancing guidelines and our immunocompromised population.

## 2020-09-28 ENCOUNTER — Encounter: Payer: Self-pay | Admitting: Nurse Practitioner

## 2020-09-28 ENCOUNTER — Ambulatory Visit (INDEPENDENT_AMBULATORY_CARE_PROVIDER_SITE_OTHER): Payer: Medicare Other | Admitting: Nurse Practitioner

## 2020-09-28 VITALS — BP 117/71 | HR 64 | Temp 97.3°F | Ht 67.0 in | Wt 260.0 lb

## 2020-09-28 DIAGNOSIS — I10 Essential (primary) hypertension: Secondary | ICD-10-CM | POA: Diagnosis not present

## 2020-09-28 DIAGNOSIS — W19XXXA Unspecified fall, initial encounter: Secondary | ICD-10-CM | POA: Diagnosis not present

## 2020-09-28 NOTE — Assessment & Plan Note (Signed)
Due to patient's continuous falls, I will make adjustments to blood pressure medication by reducing carvedilol from 25 mg tablet by mouth 212.5 mg tablet by mouth twice daily, making a total of 50 mg tablet daily.  Reducing Cozaar to 25 mg tablet by mouth daily, and continue amlodipine 10 mg tablet by mouth daily.  Take daily blood pressure cuff for 1 week and follow-up in 2 weeks.  Patient verbalized understanding Printed handouts given with instructions.  Follow-up with worsening or unresolved symptoms.

## 2020-09-28 NOTE — Progress Notes (Signed)
Acute Office Visit  Subjective:    Patient ID: [name], male    DOB: Jan 17, 1946, 75 y.o.   MRN: [id number]  Chief complaint: Fall   HPI Patient is a 75 year old male who presents to clinic for multiple falls in the last few weeks.  Patient reports home is safe and there is no need for him to keep falling, he reports going to the bathroom about 15 times to urinate and also takes all his pills at nighttime.  He reports being dizzy, weak, no fever nausea or vomiting associated with current symptoms.  Past Medical History:  Diagnosis Date   Aortic atherosclerosis ([name])    2004  s/p  closure Penetrating atherosclerotic ulcer of infarenal aorta w/ endovascular stent graft   Arthritis    CKD (chronic kidney disease), stage III (HCC)    Closed fracture of head of humerus 07/2017   right shoulder from fall; 09-25-2017 per pt no surgical intervention, only wore sling, intermittant pain   Coronary atherosclerosis of native coronary artery    positive myoview for ischemia 09-27-2009;  10-01-2009 per cardiac cath-- minimal nonobstructive CAD w/ 30% LAD    ED (erectile dysfunction)    Emphysema/COPD ([name])    followed by pcp--- last exacerbation 09-12-2017;  09-25-2017 per pt no cough, sob or congestion   Essential hypertension    Family history of kidney cancer    First degree heart block    Frequency of urination    GERD (gastroesophageal reflux disease)    Gout    09-25-2017  per pt stable,  last episode 2015   Heart murmur    History of adenomatous polyp of colon    History of closed head injury 2005   per pt residual resolved   History of external beam radiation therapy    completed 10/ 2015 for prostate cancer   History of gastric ulcer 2014   History of kidney stones    History of pneumothorax    spontaneous pneumo treated w/ chest tube   History of rib fracture 07/2017   from fall,  right side 6th,7th,8th   Neuropathy    OAB (overactive bladder)    OSA (obstructive  sleep apnea)    Intolerant of CPAP   Prostate cancer ([name])    dx 09-26-2011 via bx-- Stage T3b,N0,  Gleason 4+4, PSA 11.2 with METs to external iliac lymph node(resolved with ADT)-- treated w/ ADT ;   05/2013 staging work-up for rising PSA , started casodex and completed radiation therapy 10/ 2015;   rising PSA post treatment   RLS (restless legs syndrome)    Ureteral neocystostomy bleed    left side   Urge urinary incontinence     Past Surgical History:  Procedure Laterality Date   ABDOMINAL AORTIC ENDOVASCULAR STENT GRAFT  09-13-2007    dr [name]  [facility]   closure penetrating atherosclerotic ulcer of infarenal aorta with stent graft   BIOPSY  03/06/2019   Procedure: BIOPSY;  Surgeon: [name], MD;  [facility];  Service: Endoscopy;;  gastric   CARDIAC CATHETERIZATION  2005   per pt normal (done in [place])   [place]  10/06/2009    dr cooper   minimal nonobstructive CAD w/30% LAD otherwise normal coronaries, LVEDP 78mmHg   CARDIOVASCULAR STRESS TEST  09/27/2009    dr Aundra Dubin   lexiscan nuclear study w/ moderate reversible inferior perfusion defect ischemia,  ef 60% (cardiac cath scheduled)   CATARACT EXTRACTION [facility] Right 07/12/2020  Procedure: CATARACT EXTRACTION PHACO AND INTRAOCULAR LENS PLACEMENT RIGHT EYE;  Surgeon: [name], MD;  Location: [facility];  Service: Ophthalmology;  Laterality: Right;  CDE=15.48   CHEST TUBE INSERTION  1989   "collapsed lung"due to injury   CIRCUMCISION  09/26/2011   Procedure: CIRCUMCISION ADULT;  Surgeon: [name], MD;  Location: [facility];  Service: Urology;  Laterality: N/A;   COLONOSCOPY W/ POLYPECTOMY     COLONOSCOPY WITH PROPOFOL N/A 04/27/2016    eight 4-8 mm polyps in descending colon, at splenic flexure, in ascending colon and cecum. Tubular adenomas. Surveillance in 3 years.   CYSTOSCOPY  09/26/2011   Procedure: CYSTOSCOPY;  Surgeon: [name], MD;  Location: [facility];  Service: Urology;  Laterality:  N/A;   CYSTOSCOPY/RETROGRADE/URETEROSCOPY Left 09/27/2017   Procedure: CYSTOSCOPYLEFT /RETROGRADE/URETEROSCOPY AND RENAL WASHINGS;  Surgeon: [name], MD;  Location: [facility];  Service: Urology;  Laterality: Left;   ESOPHAGOGASTRODUODENOSCOPY (EGD) WITH PROPOFOL N/A 04/27/2016   normal esophagus s/p dilation, small hiatal hernia, normal duodenum   ESOPHAGOGASTRODUODENOSCOPY (EGD) WITH PROPOFOL N/A 03/06/2019   erosive reflux esophagitis, s/p dilation, normal duodenum, abnormal gastric mucosa s/p biopsy. Hyperplastic gastric polyp. No H.pylori.    EXTRACORPOREAL SHOCK WAVE LITHOTRIPSY  yrs ago   FRACTURE SURGERY  child   Bilateral lower arms    KNEE ARTHROSCOPY Right 2013   MALONEY DILATION N/A 04/27/2016   Procedure: Venia Minks DILATION;  Surgeon: [name], MD;  [facility];  Service: Endoscopy;  Laterality: N/A;   MALONEY DILATION N/A 03/06/2019   Procedure: Venia Minks DILATION;  Surgeon: [name], MD;  [facility];  Service: Endoscopy;  Laterality: N/A;   PARTIAL KNEE ARTHROPLASTY Right 04/26/2015   Procedure: RIGHT KNEE MEDIAL UNICOMPARTMENTAL ARTHROPLASTY;  Surgeon: Gaynelle Arabian, MD;  Location[facility];  Service: Orthopedics;  Laterality: Right;   POLYPECTOMY  04/27/2016   Procedure: POLYPECTOMY;  Surgeon: [name], MD;  [facility];  Service: Endoscopy;;  cecal , ascending, descending, and sigmoid polypectomies   PROSTATE BIOPSY  09/26/2011   Procedure: BIOPSY TRANSRECTAL ULTRASONIC PROSTATE (TUBP);  Surgeon: [name], MD;  Location: [facility];  Service: Urology;  Laterality: N/A;      TRANSTHORACIC ECHOCARDIOGRAM  01-07-2013   dr [name]   ef 60-65%,  grade 1 diastolic dysfunction/  mild AR without stenosis/  mild LAE/  mild to moderate calcificed MV annulus without regurg. or stenosis/   UMBILICAL HERNIA REPAIR  yrs ago    Family History  Problem Relation Age of Onset   Stroke Mother    Alzheimer's disease Mother 77        probably due to head injury   Mesothelioma Father 19   Kidney cancer Father    Hyperlipidemia Sister    Heart attack Sister    Kidney cancer Sister        dx in her 21s   Heart disease Brother    Kidney cancer Brother 13       dx in his 65s   Hyperlipidemia Brother    Kidney cancer Brother        dx in his 65s   Healthy Daughter    Healthy Son    Healthy Son    Colon cancer Neg Hx     Social History   Socioeconomic History   Marital status: Married    Spouse name: Not on file   Number of children: 3   Years of education:  12   Highest education level: Some college, no degree  Occupational History   Occupation: Retired    Comment: Hotel manager    Comment: Part Time at [employer] Use   Smoking status: Every Day    Packs/day: 0.50    Years: 42.00    Pack years: 21.00    Types: Cigarettes   Smokeless tobacco: Never  Vaping Use   Vaping Use: Never used  Substance and Sexual Activity   Alcohol use: No    Alcohol/week: 0.0 standard drinks   Drug use: No   Sexual activity: Yes    Birth control/protection: None  Other Topics Concern   Not on file  Social History Narrative   Patient is retired from the [employer]. He lives in a one story home with his wife. He moved from [facility] [place] about 9 years ago. They do not have family on the [place] and although he is very outgoing he feels socially isolated. He isn't a member of any groups or churches.    Social Determinants of Health   Financial Resource Strain: High Risk   Difficulty of Paying Living Expenses: Hard  Food Insecurity: Not on file  Transportation Needs: No Transportation Needs   Lack of Transportation (Medical): No   Lack of Transportation (Non-Medical): No  Physical Activity: Not on file  Stress: Not on file  Social Connections: Not on file  Intimate Partner Violence: Not At Risk   Fear of Current or Ex-Partner: No   Emotionally Abused: No   Physically Abused: No    Sexually Abused: No    Outpatient Medications Prior to Visit  Medication Sig Dispense Refill   abiraterone acetate (ZYTIGA) 250 MG tablet Take 4 tablets (1,000 mg total) by mouth daily. Take on an empty stomach 1 hour before or 2 hours after a meal 120 tablet 3   allopurinol (ZYLOPRIM) 300 MG tablet Take 1 tablet by mouth once daily 90 tablet 0   amLODipine (NORVASC) 10 MG tablet Take 1 tablet by mouth once daily 90 tablet 0   carvedilol (COREG) 25 MG tablet Take 1 tablet by mouth twice daily 180 tablet 0   DULoxetine (CYMBALTA) 60 MG capsule Take 1 capsule (60 mg total) by mouth daily. Take 1 capsule by mouth once daily (Needs to be seen before next refill) 90 capsule 3   losartan (COZAAR) 50 MG tablet Take 1 tablet (50 mg total) by mouth daily. 90 tablet 3   mirabegron ER (MYRBETRIQ) 25 MG TB24 tablet Take 1 tablet (25 mg total) by mouth daily. 28 tablet 0   nystatin (MYCOSTATIN/NYSTOP) powder Apply 1 application topically 3 (three) times daily. 15 g 0   pantoprazole (PROTONIX) 40 MG tablet Take 40 mg by mouth 2 (two) times daily.     potassium chloride SA (KLOR-CON) 20 MEQ tablet Take 1 tablet (20 mEq total) by mouth 2 (two) times daily. 60 tablet 3   predniSONE (DELTASONE) 5 MG tablet Take 1 tablet (5 mg total) by mouth daily with breakfast. 90 tablet 3   pregabalin (LYRICA) 25 MG capsule Take 1 capsule by mouth twice daily 60 capsule 3   rosuvastatin (CRESTOR) 20 MG tablet TAKE 1 TABLET BY MOUTH ONCE DAILY - STOP PRAVASTATIN 30 tablet 0   tamsulosin (FLOMAX) 0.4 MG CAPS capsule Take 1 capsule (0.4 mg total) by mouth daily. 90 capsule 3   traMADol (ULTRAM) 50 MG tablet Take 1 tablet (50 mg total) by mouth daily as needed.  30 tablet 1   No facility-administered medications prior to visit.    Allergies  Allergen Reactions   Carbidopa-Levodopa Other (See Comments)    hallunications   Morphine Sulfate Nausea And Vomiting   Sulfa Antibiotics Nausea And Vomiting   Sulfacetamide Sodium  Nausea And Vomiting    Review of Systems  Constitutional: Negative.   HENT: Negative.    Eyes: Negative.   Respiratory: Negative.    Cardiovascular: Negative.   Gastrointestinal: Negative.   Musculoskeletal: Negative.   Skin:  Negative for rash.  All other systems reviewed and are negative.     Objective:    Physical Exam Vitals reviewed.  HENT:     Head: Normocephalic.     Nose: Nose normal.  Eyes:     General: No visual field deficit. Cardiovascular:     Rate and Rhythm: Normal rate and regular rhythm.     Pulses: Normal pulses.     Heart sounds: Normal heart sounds.  Pulmonary:     Effort: Pulmonary effort is normal.     Breath sounds: Normal breath sounds.  Abdominal:     General: Bowel sounds are normal.  Musculoskeletal:        General: Normal range of motion.  Skin:    General: Skin is dry.     Findings: No rash.  Neurological:     Mental Status: He is alert and oriented to person, place, and time.     Cranial Nerves: Cranial nerves are intact. No cranial nerve deficit, dysarthria or facial asymmetry.     Sensory: Sensation is intact.     Motor: No weakness or tremor.     Gait: Gait is intact.    BP 117/71   Pulse 64   Temp (!) 97.3 F (36.3 C) (Temporal)   Ht 5\' 7"  (1.702 m)   Wt 260 lb (117.9 kg)   SpO2 98%   BMI 40.72 kg/m  Wt Readings from Last 3 Encounters:  09/28/20 260 lb (117.9 kg)  07/29/20 280 lb (127 kg)  07/06/20 270 lb (122.5 kg)    Health Maintenance Due  Topic Date Due   Hepatitis C Screening  Never done   TETANUS/TDAP  Never done   Zoster Vaccines- Shingrix (1 of 2) Never done   PNA vac Low Risk Adult (2 of 2 - PPSV23) 12/30/2015   COVID-19 Vaccine (3 - Moderna risk series) 09/02/2019    There are no preventive care reminders to display for this patient.   Lab Results  Component Value Date   TSH 3.59 08/13/2019   Lab Results  Component Value Date   WBC 7.2 09/20/2020   HGB 11.6 (L) 09/20/2020   HCT 35.3 (L)  09/20/2020   MCV 94.4 09/20/2020   PLT 169 09/20/2020   Lab Results  Component Value Date   NA 135 09/24/2020   K 3.3 (L) 09/24/2020   CHLORIDE 110 (H) 10/01/2013   CO2 22 09/24/2020   GLUCOSE 119 (H) 09/24/2020   BUN 23 09/24/2020   CREATININE 2.13 (H) 09/24/2020   BILITOT 0.7 09/24/2020   ALKPHOS 65 09/24/2020   AST 13 (L) 09/24/2020   ALT 10 09/24/2020   PROT 6.4 (L) 09/24/2020   ALBUMIN 3.3 (L) 09/24/2020   CALCIUM 8.3 (L) 09/24/2020   ANIONGAP 6 09/24/2020   Lab Results  Component Value Date   CHOL 197 06/16/2019   Lab Results  Component Value Date   HDL 45 06/16/2019   Lab Results  Component Value Date  [place] 124 (H) 06/16/2019   Lab Results  Component Value Date   TRIG 155 (H) 06/16/2019   Lab Results  Component Value Date   CHOLHDL 4.4 06/16/2019   Lab Results  Component Value Date   HGBA1C 5.6 06/16/2019       Assessment & Plan:   Problem List Items Addressed This Visit       Cardiovascular and Mediastinum   Essential hypertension, benign    Due to patient's continuous falls, I will make adjustments to blood pressure medication by reducing carvedilol from 25 mg tablet by mouth 212.5 mg tablet by mouth twice daily, making a total of 50 mg tablet daily.  Reducing Cozaar to 25 mg tablet by mouth daily, and continue amlodipine 10 mg tablet by mouth daily.  Take daily blood pressure cuff for 1 week and follow-up in 2 weeks.  Patient verbalized understanding Printed handouts given with instructions.  Follow-up with worsening or unresolved symptoms.         Other   Fall - Primary    Patient has had multiple falls in the past few weeks.  Orthostatic blood pressure shows no significant difference.  Neuro checks are intact.  Patient recommendation-increase hydration, currently on 3 blood pressure medication will cut back on losartan 50 mg to 25 mg tablet daily, will change Coreg 25 mg to 12.5 mg tablet daily, continue the same dose for amlodipine  10 mg tablet daily.  Advised patient to take flomax in the morning instead of at night to avoid getting up several times to urinate.  Keep blood pressure log 1 week follow-up in 2 weeks         No orders of the defined types were placed in this encounter.    Ivy Lynn, NP

## 2020-09-28 NOTE — Patient Instructions (Addendum)
Patient has had multiple falls in the past few weeks.  Orthostatic blood pressure shows no significant difference.  Neuro checks are intact.  Patient recommendation-increase hydration, currently on 3 blood pressure medication will cut back on losartan 50 mg to 25 mg tablet daily, will change Coreg 25 mg to 12.5 mg tablet daily, continue the same dose for amlodipine 10 mg tablet daily.  Advised patient to take flomax in the morning instead of at night to avoid getting up several times to urinate.  Keep blood pressure log 1 week follow-up in 2 weeks     Dehydration, Adult Dehydration is condition in which there is not enough water or other fluids in the body. This happens when a person loses more fluids than he or she takes in. Important body parts cannot work right without the right amount of fluids. Anyloss of fluids from the body can cause dehydration. Dehydration can be mild, worse, or very bad. It should be treated right away tokeep it from getting very bad. What are the causes? This condition may be caused by: Conditions that cause loss of water or other fluids, such as: Watery poop (diarrhea). Vomiting. Sweating a lot. Peeing (urinating) a lot. Not drinking enough fluids, especially when you: Are ill. Are doing things that take a lot of energy to do. Other illnesses and conditions, such as fever or infection. Certain medicines, such as medicines that take extra fluid out of the body (diuretics). Lack of safe drinking water. Not being able to get enough water and food. What increases the risk? The following factors may make you more likely to develop this condition: Having a long-term (chronic) illness that has not been treated the right way, such as: Diabetes. Heart disease. Kidney disease. Being 70 years of age or older. Having a disability. Living in a place that is high above the ground or sea (high in altitude). The thinner, dried air causes more fluid loss. Doing exercises  that put stress on your body for a long time. What are the signs or symptoms? Symptoms of dehydration depend on how bad it is. Mild or worse dehydration Thirst. Dry lips or dry mouth. Feeling dizzy or light-headed, especially when you stand up from sitting. Muscle cramps. Your body making: Dark pee (urine). Pee may be the color of tea. Less pee than normal. Less tears than normal. Headache. Very bad dehydration Changes in skin. Skin may: Be cold to the touch (clammy). Be blotchy or pale. Not go back to normal right after you lightly pinch it and let it go. Little or no tears, pee, or sweat. Changes in vital signs, such as: Fast breathing. Low blood pressure. Weak pulse. Pulse that is more than 100 beats a minute when you are sitting still. Other changes, such as: Feeling very thirsty. Eyes that look hollow (sunken). Cold hands and feet. Being mixed up (confused). Being very tired (lethargic) or having trouble waking from sleep. Short-term weight loss. Loss of consciousness. How is this treated? Treatment for this condition depends on how bad it is. Treatment should start right away. Do not wait until your condition gets very bad. Very bad dehydration is an emergency. You will need to go to a hospital. Mild or worse dehydration can be treated at home. You may be asked to: Drink more fluids. Drink an oral rehydration solution (ORS). This drink helps get the right amounts of fluids and salts and minerals in the blood (electrolytes). Very bad dehydration can be treated: With fluids through an  IV tube. By getting normal levels of salts and minerals in your blood. This is often done by giving salts and minerals through a tube. The tube is passed through your nose and into your stomach. By treating the root cause. Follow these instructions at home: Oral rehydration solution If told by your doctor, drink an ORS: Make an ORS. Use instructions on the package. Start by drinking  small amounts, about  cup (120 mL) every 5-10 minutes. Slowly drink more until you have had the amount that your doctor said to have. Eating and drinking        Drink enough clear fluid to keep your pee pale yellow. If you were told to drink an ORS, finish the ORS first. Then, start slowly drinking other clear fluids. Drink fluids such as: Water. Do not drink only water. Doing that can make the salt (sodium) level in your body get too low. Water from ice chips you suck on. Fruit juice that you have added water to (diluted). Low-calorie sports drinks. Eat foods that have the right amounts of salts and minerals, such as: Bananas. Oranges. Potatoes. Tomatoes. Spinach. Do not drink alcohol. Avoid: Drinks that have a lot of sugar. These include: High-calorie sports drinks. Fruit juice that you did not add water to. Soda. Caffeine. Foods that are greasy or have a lot of fat or sugar. General instructions Take over-the-counter and prescription medicines only as told by your doctor. Do not take salt tablets. Doing that can make the salt level in your body get too high. Return to your normal activities as told by your doctor. Ask your doctor what activities are safe for you. Keep all follow-up visits as told by your doctor. This is important. Contact a doctor if: You have pain in your belly (abdomen) and the pain: Gets worse. Stays in one place. You have a rash. You have a stiff neck. You get angry or annoyed (irritable) more easily than normal. You are more tired or have a harder time waking than normal. You feel: Weak or dizzy. Very thirsty. Get help right away if you have: Any symptoms of very bad dehydration. Symptoms of vomiting, such as: You cannot eat or drink without vomiting. Your vomiting gets worse or does not go away. Your vomit has blood or green stuff in it. Symptoms that get worse with treatment. A fever. A very bad headache. Problems with peeing or pooping  (having a bowel movement), such as: Watery poop that gets worse or does not go away. Blood in your poop (stool). This may cause poop to look black and tarry. Not peeing in 6-8 hours. Peeing only a small amount of very dark pee in 6-8 hours. Trouble breathing. These symptoms may be an emergency. Do not wait to see if the symptoms will go away. Get medical help right away. Call your local emergency services (911 in the [place] Do not drive yourself to the hospital. Summary Dehydration is a condition in which there is not enough water or other fluids in the body. This happens when a person loses more fluids than he or she takes in. Treatment for this condition depends on how bad it is. Treatment should be started right away. Do not wait until your condition gets very bad. Drink enough clear fluid to keep your pee pale yellow. If you were told to drink an oral rehydration solution (ORS), finish the ORS first. Then, start slowly drinking other clear fluids. Take over-the-counter and prescription medicines only as told by  your doctor. Get help right away if you have any symptoms of very bad dehydration. This information is not intended to replace advice given to you by your health care provider. Make sure you discuss any questions you have with your healthcare provider. Document Revised: 10/31/2018 Document Reviewed: 10/31/2018 Elsevier Patient Education  2022 [place] Prevention in the Home, Adult Falls can cause injuries and can happen to people of all ages. There are many things you can do to make your home safe and to help prevent falls. Ask forhelp when making these changes. What actions can I take to prevent falls? General Instructions Use good lighting in all rooms. Replace any light bulbs that burn out. Turn on the lights in dark areas. Use night-lights. Keep items that you use often in easy-to-reach places. Lower the shelves around your home if needed. Set up your furniture so you  have a clear path. Avoid moving your furniture around. Do not have throw rugs or other things on the floor that can make you trip. Avoid walking on wet floors. If any of your floors are uneven, fix them. Add color or contrast paint or tape to clearly mark and help you see: Grab bars or handrails. First and last steps of staircases. Where the edge of each step is. If you use a stepladder: Make sure that it is fully opened. Do not climb a closed stepladder. Make sure the sides of the stepladder are locked in place. Ask someone to hold the stepladder while you use it. Know where your pets are when moving through your home. What can I do in the bathroom?     Keep the floor dry. Clean up any water on the floor right away. Remove soap buildup in the tub or shower. Use nonskid mats or decals on the floor of the tub or shower. Attach bath mats securely with double-sided, nonslip rug tape. If you need to sit down in the shower, use a plastic, nonslip stool. Install grab bars by the toilet and in the tub and shower. Do not use towel bars as grab bars. What can I do in the bedroom? Make sure that you have a light by your bed that is easy to reach. Do not use any sheets or blankets for your bed that hang to the floor. Have a firm chair with side arms that you can use for support when you get dressed. What can I do in the kitchen? Clean up any spills right away. If you need to reach something above you, use a step stool with a grab bar. Keep electrical cords out of the way. Do not use floor polish or wax that makes floors slippery. What can I do with my stairs? Do not leave any items on the stairs. Make sure that you have a light switch at the top and the bottom of the stairs. Make sure that there are handrails on both sides of the stairs. Fix handrails that are broken or loose. Install nonslip stair treads on all your stairs. Avoid having throw rugs at the top or bottom of the  stairs. Choose a carpet that does not hide the edge of the steps on the stairs. Check carpeting to make sure that it is firmly attached to the stairs. Fix carpet that is loose or worn. What can I do on the outside of my home? Use bright outdoor lighting. Fix the edges of walkways and driveways and fix any cracks. Remove anything that might make you trip  as you walk through a door, such as a raised step or threshold. Trim any bushes or trees on paths to your home. Check to see if handrails are loose or broken and that both sides of all steps have handrails. Install guardrails along the edges of any raised decks and porches. Clear paths of anything that can make you trip, such as tools or rocks. Have leaves, snow, or ice cleared regularly. Use sand or salt on paths during winter. Clean up any spills in your garage right away. This includes grease or oil spills. What other actions can I take? Wear shoes that: Have a low heel. Do not wear high heels. Have rubber bottoms. Feel good on your feet and fit well. Are closed at the toe. Do not wear open-toe sandals. Use tools that help you move around if needed. These include: Canes. Walkers. Scooters. Crutches. Review your medicines with your doctor. Some medicines can make you feel dizzy. This can increase your chance of falling. Ask your doctor what else you can do to help prevent falls. Where to find more information Centers for Disease Control and Prevention, [employer]: http://www.wolf.info/ [employer]ging: http://kim-miller.com/ Contact a doctor if: You are afraid of falling at home. You feel weak, drowsy, or dizzy at home. You fall at home. Summary There are many simple things that you can do to make your home safe and to help prevent falls. Ways to make your home safe include removing things that can make you trip and installing grab bars in the bathroom. Ask for help when making these changes in your home. This information is not  intended to replace advice given to you by your health care provider. Make sure you discuss any questions you have with your healthcare provider. Document Revised: 10/22/2019 Document Reviewed: 10/22/2019 Elsevier Patient Education  [place].

## 2020-09-28 NOTE — Progress Notes (Signed)
[place]  [phone] [phone]  ID: RODY KEADLE OB: 24-Feb-1946  MR#: [id number]  RXV#:[id number]  Patient Care Team: [name], [name], MD as PCP - General (Family Medicine) Satira Sark, MD as PCP - Cardiology (Cardiology) Satira Sark, MD as Consulting Physician (Cardiology) Ilean China, RN as Registered Nurse Derek Jack, MD as Medical Oncologist (Medical Oncology)  I connected with [name] on 09/28/20 at  2:00 PM EDT by video enabled telemedicine visit and verified that I am speaking with the correct person using two identifiers.   I discussed the limitations, risks, security and privacy concerns of performing an evaluation and management service by telemedicine and the availability of in-person appointments. I also discussed with the patient that there may be a patient responsible charge related to this service. The patient expressed understanding and agreed to proceed.   Other persons participating in the visit and their role in the encounter: Patient, MD.  Patient's location: Clinic. Provider's location: Home.  CHIEF COMPLAINT: Metastatic prostate cancer  INTERVAL HISTORY: Patient returns to clinic today for laboratory work and further evaluation.  He was recently switched from [place] to [place] with prednisone and is tolerating his treatments well.  He continues to complain of memory problems as well as increased falls.  He otherwise feels well.  He has no other neurologic complaints.  He denies any recent fevers or illnesses.  He has a fair appetite, but denies weight loss.  He has no chest pain, shortness of breath, cough, or hemoptysis.  He denies any nausea, vomiting, constipation, or diarrhea.  He has no urinary complaints.  Patient offers no further specific complaints today.  REVIEW OF SYSTEMS:   Review of Systems  Constitutional: Negative.  Negative for fever, malaise/fatigue and weight  loss.  Respiratory: Negative.  Negative for cough, hemoptysis and shortness of breath.   Cardiovascular: Negative.  Negative for chest pain and leg swelling.  Gastrointestinal:  Negative for abdominal pain.  Genitourinary: Negative.  Negative for dysuria.  Musculoskeletal:  Positive for falls. Negative for back pain.  Skin: Negative.  Negative for rash.  Neurological:  Positive for tingling and sensory change. Negative for dizziness, focal weakness, weakness and headaches.  Psychiatric/Behavioral:  Positive for memory loss.    As per HPI. Otherwise, a complete review of systems is negative.  PAST MEDICAL HISTORY: Past Medical History:  Diagnosis Date   Aortic atherosclerosis ([place])    2004  s/p  closure Penetrating atherosclerotic ulcer of infarenal aorta w/ endovascular stent graft   Arthritis    CKD (chronic kidney disease), stage III (HCC)    Closed fracture of head of humerus 07/2017   right shoulder from fall; 09-25-2017 per pt no surgical intervention, only wore sling, intermittant pain   Coronary atherosclerosis of native coronary artery    positive myoview for ischemia 09-27-2009;  10-01-2009 per cardiac cath-- minimal nonobstructive CAD w/ 30% LAD    ED (erectile dysfunction)    Emphysema/COPD ([place])    followed by pcp--- last exacerbation 09-12-2017;  09-25-2017 per pt no cough, sob or congestion   Essential hypertension    Family history of kidney cancer    First degree heart block    Frequency of urination    GERD (gastroesophageal reflux disease)    Gout    09-25-2017  per pt stable,  last episode 2015   Heart murmur    History of adenomatous polyp of colon    History of closed head  injury 2005   per pt residual resolved   History of external beam radiation therapy    completed 10/ 2015 for prostate cancer   History of gastric ulcer 2014   History of kidney stones    History of pneumothorax    spontaneous pneumo treated w/ chest tube   History of rib fracture  07/2017   from fall,  right side 6th,7th,8th   Neuropathy    OAB (overactive bladder)    OSA (obstructive sleep apnea)    Intolerant of CPAP   Prostate cancer ([place])    dx 09-26-2011 via bx-- Stage T3b,N0,  Gleason 4+4, PSA 11.2 with METs to external iliac lymph node(resolved with ADT)-- treated w/ ADT ;   05/2013 staging work-up for rising PSA , started casodex and completed radiation therapy 10/ 2015;   rising PSA post treatment   RLS (restless legs syndrome)    Ureteral neocystostomy bleed    left side   Urge urinary incontinence     PAST SURGICAL HISTORY: Past Surgical History:  Procedure Laterality Date   ABDOMINAL AORTIC ENDOVASCULAR STENT GRAFT  09-13-2007    dr [name]  [facility]   closure penetrating atherosclerotic ulcer of infarenal aorta with stent graft   BIOPSY  03/06/2019   Procedure: BIOPSY;  Surgeon: [name], MD;  [facility];  Service: Endoscopy;;  gastric   CARDIAC CATHETERIZATION  2005   per pt normal (done in [place])   [place]  10/06/2009    dr cooper   minimal nonobstructive CAD w/30% LAD otherwise normal coronaries, LVEDP 11mmHg   CARDIOVASCULAR STRESS TEST  09/27/2009    dr Aundra Dubin   lexiscan nuclear study w/ moderate reversible inferior perfusion defect ischemia,  ef 60% (cardiac cath scheduled)   CATARACT EXTRACTION W/PHACO Right 07/12/2020   Procedure: CATARACT EXTRACTION PHACO AND INTRAOCULAR LENS PLACEMENT RIGHT EYE;  Surgeon: [name], MD;  Location: [facility];  Service: Ophthalmology;  Laterality: Right;  CDE=15.48   CHEST TUBE INSERTION  1989   "collapsed lung"due to injury   CIRCUMCISION  09/26/2011   Procedure: CIRCUMCISION ADULT;  Surgeon: [name], MD;  Location: [facility];  Service: Urology;  Laterality: N/A;   COLONOSCOPY W/ POLYPECTOMY     COLONOSCOPY WITH PROPOFOL N/A 04/27/2016    eight 4-8 mm polyps in descending colon, at splenic flexure, in ascending colon and cecum. Tubular adenomas. Surveillance in 3  years.   CYSTOSCOPY  09/26/2011   Procedure: CYSTOSCOPY;  Surgeon: [name], MD;  Location: [facility];  Service: Urology;  Laterality: N/A;   CYSTOSCOPY/RETROGRADE/URETEROSCOPY Left 09/27/2017   Procedure: CYSTOSCOPYLEFT /RETROGRADE/URETEROSCOPY AND RENAL WASHINGS;  Surgeon: [name], MD;  Location: [facility];  Service: Urology;  Laterality: Left;   ESOPHAGOGASTRODUODENOSCOPY (EGD) WITH PROPOFOL N/A 04/27/2016   normal esophagus s/p dilation, small hiatal hernia, normal duodenum   ESOPHAGOGASTRODUODENOSCOPY (EGD) WITH PROPOFOL N/A 03/06/2019   erosive reflux esophagitis, s/p dilation, normal duodenum, abnormal gastric mucosa s/p biopsy. Hyperplastic gastric polyp. No H.pylori.    EXTRACORPOREAL SHOCK WAVE LITHOTRIPSY  yrs ago   FRACTURE SURGERY  child   Bilateral lower arms    KNEE ARTHROSCOPY Right 2013   MALONEY DILATION N/A 04/27/2016   Procedure: Venia Minks DILATION;  Surgeon: [name], MD;  [facility];  Service: Endoscopy;  Laterality: N/A;   MALONEY DILATION N/A 03/06/2019   Procedure: Venia Minks DILATION;  Surgeon: [name], MD;  [facility];  Service: Endoscopy;  Laterality:  N/A;   PARTIAL KNEE ARTHROPLASTY Right 04/26/2015   Procedure: RIGHT KNEE MEDIAL UNICOMPARTMENTAL ARTHROPLASTY;  Surgeon: Gaynelle Arabian, MD;  [facility];  Service: Orthopedics;  Laterality: Right;   POLYPECTOMY  04/27/2016   Procedure: POLYPECTOMY;  Surgeon: [name], MD;  [facility];  Service: Endoscopy;;  cecal , ascending, descending, and sigmoid polypectomies   PROSTATE BIOPSY  09/26/2011   Procedure: BIOPSY TRANSRECTAL ULTRASONIC PROSTATE (TUBP);  Surgeon: [name], MD;  [facility];  Service: Urology;  Laterality: N/A;      TRANSTHORACIC ECHOCARDIOGRAM  01-07-2013   dr [name]   ef 60-65%,  grade 1 diastolic dysfunction/  mild AR without stenosis/  mild LAE/  mild to moderate calcificed MV annulus without regurg. or stenosis/    UMBILICAL HERNIA REPAIR  yrs ago    FAMILY HISTORY: Family History  Problem Relation Age of Onset   Stroke Mother    Alzheimer's disease Mother 90       probably due to head injury   Mesothelioma Father 37   Kidney cancer Father    Hyperlipidemia Sister    Heart attack Sister    Kidney cancer Sister        dx in her 1s   Heart disease Brother    Kidney cancer Brother 62       dx in his 33s   Hyperlipidemia Brother    Kidney cancer Brother        dx in his 40s   Healthy Daughter    Healthy Son    Healthy Son    Colon cancer Neg Hx     ADVANCED DIRECTIVES (Y/N):  N  HEALTH MAINTENANCE: Social History   Tobacco Use   Smoking status: Every Day    Packs/day: 0.50    Years: 42.00    Pack years: 21.00    Types: Cigarettes   Smokeless tobacco: Never  Vaping Use   Vaping Use: Never used  Substance Use Topics   Alcohol use: No    Alcohol/week: 0.0 standard drinks   Drug use: No     Colonoscopy:  PAP:  Bone density:  Lipid panel:  Allergies  Allergen Reactions   Carbidopa-Levodopa Other (See Comments)    hallunications   Morphine Sulfate Nausea And Vomiting   Sulfa Antibiotics Nausea And Vomiting   Sulfacetamide Sodium Nausea And Vomiting    Current Outpatient Medications  Medication Sig Dispense Refill   abiraterone acetate (ZYTIGA) 250 MG tablet Take 4 tablets (1,000 mg total) by mouth daily. Take on an empty stomach 1 hour before or 2 hours after a meal 120 tablet 3   allopurinol (ZYLOPRIM) 300 MG tablet Take 1 tablet by mouth once daily 90 tablet 0   amLODipine (NORVASC) 10 MG tablet Take 1 tablet by mouth once daily 90 tablet 0   carvedilol (COREG) 25 MG tablet Take 1 tablet by mouth twice daily 180 tablet 0   DULoxetine (CYMBALTA) 60 MG capsule Take 1 capsule (60 mg total) by mouth daily. Take 1 capsule by mouth once daily (Needs to be seen before next refill) 90 capsule 3   losartan (COZAAR) 50 MG tablet Take 1 tablet (50 mg total) by mouth daily. 90  tablet 3   mirabegron ER (MYRBETRIQ) 25 MG TB24 tablet Take 1 tablet (25 mg total) by mouth daily. 28 tablet 0   nystatin (MYCOSTATIN/NYSTOP) powder Apply 1 application topically 3 (three) times daily. 15 g 0   pantoprazole (PROTONIX) 40 MG  tablet Take 40 mg by mouth 2 (two) times daily.     potassium chloride SA (KLOR-CON) 20 MEQ tablet Take 1 tablet (20 mEq total) by mouth 2 (two) times daily. 60 tablet 3   predniSONE (DELTASONE) 5 MG tablet Take 1 tablet (5 mg total) by mouth daily with breakfast. 90 tablet 3   pregabalin (LYRICA) 25 MG capsule Take 1 capsule by mouth twice daily 60 capsule 3   rosuvastatin (CRESTOR) 20 MG tablet TAKE 1 TABLET BY MOUTH ONCE DAILY - STOP PRAVASTATIN 30 tablet 0   tamsulosin (FLOMAX) 0.4 MG CAPS capsule Take 1 capsule (0.4 mg total) by mouth daily. 90 capsule 3   traMADol (ULTRAM) 50 MG tablet Take 1 tablet (50 mg total) by mouth daily as needed. 30 tablet 1   No current facility-administered medications for this visit.    OBJECTIVE: Vitals:   09/27/20 1423  BP: 128/70  Pulse: 70  Resp: 16  Temp: 97.8 F (36.6 C)  SpO2: 97%     There is no height or weight on file to calculate BMI.    ECOG FS:1 - Symptomatic but completely ambulatory  General: Well-developed, well-nourished, no acute distress. HEENT: Normocephalic. Neuro: Alert, answering all questions appropriately. Cranial nerves grossly intact. Psych: Normal affect.   LAB RESULTS:  Lab Results  Component Value Date   NA 135 09/24/2020   K 3.3 (L) 09/24/2020   CL 107 09/24/2020   CO2 22 09/24/2020   GLUCOSE 119 (H) 09/24/2020   BUN 23 09/24/2020   CREATININE 2.13 (H) 09/24/2020   CALCIUM 8.3 (L) 09/24/2020   PROT 6.4 (L) 09/24/2020   ALBUMIN 3.3 (L) 09/24/2020   AST 13 (L) 09/24/2020   ALT 10 09/24/2020   ALKPHOS 65 09/24/2020   BILITOT 0.7 09/24/2020   GFRNONAA 32 (L) 09/24/2020   GFRAA 34 (L) 06/16/2019    Lab Results  Component Value Date   WBC 7.2 09/20/2020   NEUTROABS  5.7 09/20/2020   HGB 11.6 (L) 09/20/2020   HCT 35.3 (L) 09/20/2020   MCV 94.4 09/20/2020   PLT 169 09/20/2020     STUDIES: No results found.  ASSESSMENT: Metastatic prostate cancer  PLAN:    1.  Metastatic prostate cancer: Gillermina Phy was discontinued secondary to memory problems, but patient reports there has been no significant change since initiating Zytiga.  He admits to not initiating prednisone as prescribed and was encouraged to do so.  He continues to receive Lupron with Dr. [name] and his most recent injection was on July 29, 2020.  Return to clinic in 1 month for further evaluation and continuation of treatment. 2.  Neuropathy: Chronic and unchanged.  Continue Lyrica as prescribed. 3.  Chronic renal insufficiency: Patient's creatinine is 1.13 today which appears to be his baseline. 4.  Hypokalemia: Mild, continue oral potassium supplementation as prescribed.  I provided 20 minutes of face-to-face video visit time during this encounter which included chart review, counseling, and coordination of care as documented above.   Patient expressed understanding and was in agreement with this plan. He also understands that He can call clinic at any time with any questions, concerns, or complaints.   Cancer Staging Prostate cancer [facility]) Staging form: Prostate, AJCC 7th Edition - Clinical: Stage IV (pT3b, N1, M0) - Signed by Wyatt Portela, MD on 07/09/2013   Lloyd Huger, MD   09/28/2020 4:09 PM

## 2020-09-28 NOTE — Assessment & Plan Note (Signed)
Patient has had multiple falls in the past few weeks.  Orthostatic blood pressure shows no significant difference.  Neuro checks are intact.  Patient recommendation-increase hydration, currently on 3 blood pressure medication will cut back on losartan 50 mg to 25 mg tablet daily, will change Coreg 25 mg to 12.5 mg tablet daily, continue the same dose for amlodipine 10 mg tablet daily.  Advised patient to take flomax in the morning instead of at night to avoid getting up several times to urinate.  Keep blood pressure log 1 week follow-up in 2 weeks

## 2020-09-29 ENCOUNTER — Other Ambulatory Visit: Payer: Self-pay | Admitting: Cardiology

## 2020-09-29 LAB — METANEPHRINES, PLASMA
Metanephrine, Free: 23.8 pg/mL (ref 0.0–88.0)
Normetanephrine, Free: 274.6 pg/mL (ref 0.0–285.2)

## 2020-09-30 ENCOUNTER — Telehealth (HOSPITAL_COMMUNITY): Payer: Self-pay | Admitting: Dietician

## 2020-09-30 NOTE — Telephone Encounter (Signed)
Nutrition  Patient identified on MST (+weight loss)  Attempted to contact patient via telephone for nutrition assessment. Patient did not answer. Unable to leave message. Will attempt to contact patient at a later time as able.

## 2020-10-01 LAB — METHYLMALONIC ACID, SERUM: Methylmalonic Acid, Quantitative: 507 nmol/L — ABNORMAL HIGH (ref 0–378)

## 2020-10-06 ENCOUNTER — Telehealth (HOSPITAL_COMMUNITY): Payer: Self-pay | Admitting: *Deleted

## 2020-10-06 ENCOUNTER — Telehealth (HOSPITAL_COMMUNITY): Payer: Self-pay | Admitting: Pharmacy Technician

## 2020-10-06 NOTE — Telephone Encounter (Signed)
Patient aware and will hold for now and begin imodium. I will follow up with him on Monday to assess response.

## 2020-10-06 NOTE — Telephone Encounter (Signed)
Stop zytiga now Take imodium as needed Ask him to call back next week and let us know how he is doing before resuming treatment

## 2020-10-06 NOTE — Telephone Encounter (Signed)
Patient called stating that he is having 7-8 diarrhea stools per day, stomach pain and no desire to eat.  Was switched from [place] to [place] on 5/20 and reports that he has had these symptoms, to a degree since, however it is becoming intolerable.  Not currently taking anything for the diarrhea.

## 2020-10-07 ENCOUNTER — Other Ambulatory Visit: Payer: Self-pay

## 2020-10-07 ENCOUNTER — Emergency Department (HOSPITAL_COMMUNITY): Payer: Medicare Other

## 2020-10-07 ENCOUNTER — Emergency Department (HOSPITAL_COMMUNITY)
Admission: EM | Admit: 2020-10-07 | Discharge: 2020-10-08 | Disposition: A | Discharge status: Home / Self Care | Payer: Medicare Other | Attending: Emergency Medicine | Admitting: Emergency Medicine

## 2020-10-07 ENCOUNTER — Encounter (HOSPITAL_COMMUNITY): Payer: Self-pay | Admitting: Emergency Medicine

## 2020-10-07 DIAGNOSIS — R600 Localized edema: Secondary | ICD-10-CM | POA: Insufficient documentation

## 2020-10-07 DIAGNOSIS — R197 Diarrhea, unspecified: Secondary | ICD-10-CM | POA: Insufficient documentation

## 2020-10-07 DIAGNOSIS — Z96651 Presence of right artificial knee joint: Secondary | ICD-10-CM | POA: Diagnosis not present

## 2020-10-07 DIAGNOSIS — I2510 Atherosclerotic heart disease of native coronary artery without angina pectoris: Secondary | ICD-10-CM | POA: Insufficient documentation

## 2020-10-07 DIAGNOSIS — F17210 Nicotine dependence, cigarettes, uncomplicated: Secondary | ICD-10-CM | POA: Diagnosis not present

## 2020-10-07 DIAGNOSIS — K219 Gastro-esophageal reflux disease without esophagitis: Secondary | ICD-10-CM | POA: Insufficient documentation

## 2020-10-07 DIAGNOSIS — Z8546 Personal history of malignant neoplasm of prostate: Secondary | ICD-10-CM | POA: Diagnosis not present

## 2020-10-07 DIAGNOSIS — Z79899 Other long term (current) drug therapy: Secondary | ICD-10-CM | POA: Diagnosis not present

## 2020-10-07 DIAGNOSIS — I129 Hypertensive chronic kidney disease with stage 1 through stage 4 chronic kidney disease, or unspecified chronic kidney disease: Secondary | ICD-10-CM | POA: Insufficient documentation

## 2020-10-07 DIAGNOSIS — J449 Chronic obstructive pulmonary disease, unspecified: Secondary | ICD-10-CM | POA: Insufficient documentation

## 2020-10-07 DIAGNOSIS — R1032 Left lower quadrant pain: Secondary | ICD-10-CM | POA: Diagnosis not present

## 2020-10-07 DIAGNOSIS — N183 Chronic kidney disease, stage 3 unspecified: Secondary | ICD-10-CM | POA: Diagnosis not present

## 2020-10-07 DIAGNOSIS — K469 Unspecified abdominal hernia without obstruction or gangrene: Secondary | ICD-10-CM | POA: Insufficient documentation

## 2020-10-07 DIAGNOSIS — K439 Ventral hernia without obstruction or gangrene: Secondary | ICD-10-CM

## 2020-10-07 LAB — URINALYSIS, ROUTINE W REFLEX MICROSCOPIC
Bacteria, UA: NONE SEEN
Bilirubin Urine: NEGATIVE
Glucose, UA: NEGATIVE mg/dL
Ketones, ur: NEGATIVE mg/dL
Leukocytes,Ua: NEGATIVE
Nitrite: NEGATIVE
Protein, ur: 100 mg/dL — AB
Specific Gravity, Urine: 1.01 (ref 1.005–1.030)
pH: 6 (ref 5.0–8.0)

## 2020-10-07 LAB — CBC
HCT: 35.5 % — ABNORMAL LOW (ref 39.0–52.0)
Hemoglobin: 11.9 g/dL — ABNORMAL LOW (ref 13.0–17.0)
MCH: 31.2 pg (ref 26.0–34.0)
MCHC: 33.5 g/dL (ref 30.0–36.0)
MCV: 93.2 fL (ref 80.0–100.0)
Platelets: 228 10*3/uL (ref 150–400)
RBC: 3.81 MIL/uL — ABNORMAL LOW (ref 4.22–5.81)
RDW: 14.6 % (ref 11.5–15.5)
WBC: 6.5 10*3/uL (ref 4.0–10.5)
nRBC: 0 % (ref 0.0–0.2)

## 2020-10-07 LAB — COMPREHENSIVE METABOLIC PANEL
ALT: 11 U/L (ref 0–44)
AST: 16 U/L (ref 15–41)
Albumin: 3.5 g/dL (ref 3.5–5.0)
Alkaline Phosphatase: 71 U/L (ref 38–126)
Anion gap: 7 (ref 5–15)
BUN: 15 mg/dL (ref 8–23)
CO2: 23 mmol/L (ref 22–32)
Calcium: 8 mg/dL — ABNORMAL LOW (ref 8.9–10.3)
Chloride: 108 mmol/L (ref 98–111)
Creatinine, Ser: 1.83 mg/dL — ABNORMAL HIGH (ref 0.61–1.24)
GFR, Estimated: 38 mL/min — ABNORMAL LOW (ref >60)
Glucose, Bld: 88 mg/dL (ref 70–99)
Potassium: 3 mmol/L — ABNORMAL LOW (ref 3.5–5.1)
Sodium: 138 mmol/L (ref 135–145)
Total Bilirubin: 0.3 mg/dL (ref 0.3–1.2)
Total Protein: 6.7 g/dL (ref 6.5–8.1)

## 2020-10-07 LAB — LIPASE, BLOOD: Lipase: 45 U/L (ref 11–51)

## 2020-10-07 MED ORDER — FENTANYL CITRATE (PF) 100 MCG/2ML IJ SOLN
50.0000 ug | Freq: Once | INTRAMUSCULAR | Status: AC
  Administered 2020-10-07: 50 ug via INTRAVENOUS
  Filled 2020-10-07: qty 2

## 2020-10-07 MED ORDER — SODIUM CHLORIDE 0.9 % IV BOLUS
500.0000 mL | Freq: Once | INTRAVENOUS | Status: AC
  Administered 2020-10-07: 500 mL via INTRAVENOUS

## 2020-10-07 NOTE — ED Triage Notes (Signed)
Pt with prostate cancer that states he is having abdominal pain x 3-4 days. States he is on a new Chemotherapy drug and started to have diarrhea prior to this. Pt states he diarrhea has since resolved as he started taking Immodium.

## 2020-10-07 NOTE — ED Provider Notes (Signed)
  Provider Note MRN:  [id number]  Arrival date & time: 10/07/20    ED Course and Medical Decision Making  Assumed care from Dr. Melina Copa at shift change.  Suspect diverticulitis awaiting CT abdomen.  CT is overall reassuring, no diverticulitis or emergent findings.  There is evidence of a fat-containing hernia at or near the location of patient's pain.  On reassessment patient feels well, not in pain at this time.  With this reassuring work-up patient is appropriate for discharge.  Procedures  Final Clinical Impressions(s) / ED Diagnoses     ICD-10-CM   1. Hernia of abdominal wall  K43.9       ED Discharge Orders     None         Discharge Instructions      You were evaluated in the Emergency Department and after careful evaluation, we did not find any emergent condition requiring admission or further testing in the hospital.  Your exam/testing today was overall reassuring.  Symptoms may be due to a small fat-containing hernia of the abdominal wall.  This discomfort may resolve on its own.  If it persists we recommend follow-up with the general surgeon.  Please return to the Emergency Department if you experience any worsening of your condition.  Thank you for allowing [place] to be a part of your care.       Barth Kirks. [place]onia Small, [place] [email or web address]    Maudie Flakes, MD 10/07/20 [phone]

## 2020-10-07 NOTE — ED Provider Notes (Signed)
[facility] EMERGENCY DEPARTMENT Provider Note   CSN: [id number] Arrival date & time: 10/07/20  1930     History Chief Complaint  Patient presents with   Abdominal Pain    MAKAR SLATTER is a 75 y.o. male.  He has a history of prostate cancer and is on oral chemo with Dr. Delton Coombes.  He is complaining of 3 or 4 days of left lower quadrant abdominal pain and loose stool.  No blood.  No fevers or chills.  He says when he eats food the pain goes away for about an hour and then comes back.  No urinary symptoms.  Said the pain is similar to when he had a stomach ulcer a long time ago.  Denies nausea or vomiting.  Oncology recommended he stop his chemo agent and start some Imodium with improvement in his diarrhea.  The history is provided by the patient.  Abdominal Pain Pain location:  LLQ Pain quality: aching   Pain radiates to:  Does not radiate Pain severity:  Moderate Onset quality:  Gradual Duration:  4 days Timing:  Intermittent Progression:  Unchanged Chronicity:  New Context: not trauma   Relieved by:  Eating Worsened by:  Nothing Ineffective treatments:  None tried Associated symptoms: diarrhea   Associated symptoms: no chest pain, no chills, no constipation, no cough, no dysuria, no fever, no hematochezia, no hematuria, no nausea, no shortness of breath, no sore throat and no vomiting       Past Medical History:  Diagnosis Date   Aortic atherosclerosis ([place])    2004  s/p  closure Penetrating atherosclerotic ulcer of infarenal aorta w/ endovascular stent graft   Arthritis    CKD (chronic kidney disease), stage III (HCC)    Closed fracture of head of humerus 07/2017   right shoulder from fall; 09-25-2017 per pt no surgical intervention, only wore sling, intermittant pain   Coronary atherosclerosis of native coronary artery    positive myoview for ischemia 09-27-2009;  10-01-2009 per cardiac cath-- minimal nonobstructive CAD w/ 30% LAD    ED (erectile dysfunction)     Emphysema/COPD ([place])    followed by pcp--- last exacerbation 09-12-2017;  09-25-2017 per pt no cough, sob or congestion   Essential hypertension    Family history of kidney cancer    First degree heart block    Frequency of urination    GERD (gastroesophageal reflux disease)    Gout    09-25-2017  per pt stable,  last episode 2015   Heart murmur    History of adenomatous polyp of colon    History of closed head injury 2005   per pt residual resolved   History of external beam radiation therapy    completed 10/ 2015 for prostate cancer   History of gastric ulcer 2014   History of kidney stones    History of pneumothorax    spontaneous pneumo treated w/ chest tube   History of rib fracture 07/2017   from fall,  right side 6th,7th,8th   Neuropathy    OAB (overactive bladder)    OSA (obstructive sleep apnea)    Intolerant of CPAP   Prostate cancer ([place])    dx 09-26-2011 via bx-- Stage T3b,N0,  Gleason 4+4, PSA 11.2 with METs to external iliac lymph node(resolved with ADT)-- treated w/ ADT ;   05/2013 staging work-up for rising PSA , started casodex and completed radiation therapy 10/ 2015;   rising PSA post treatment   RLS (restless legs syndrome)  Ureteral neocystostomy bleed    left side   Urge urinary incontinence     Patient Active Problem List   Diagnosis Date Noted   Fall 09/28/2020   Genetic testing 23/53/6144   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 01/19/2020   Family history of kidney cancer    Constipation 03/04/2019   CKD (chronic kidney disease), stage III ([place]) 07/10/2018   Depression, recurrent ([place]) 04/08/2018   Dysphagia 07/10/2016   Neuropathy 03/17/2015   Prostate cancer ([place]) 06/30/2013   Abdominal aneurysm without mention of rupture 02/19/2013   Morbid obesity ([place]) 01/14/2013   VITAMIN B12 DEFICIENCY 01/14/2013   TOBACCO ABUSE 01/14/2013   PTSD 01/14/2013   COPD (chronic obstructive pulmonary disease) (HCC)    OSA (obstructive sleep apnea)  08/14/2012   RLS (restless legs syndrome) 08/14/2012   Prediabetes 08/14/2012   Aortic atherosclerosis ([place]) 02/19/2012   Coronary atherosclerosis of native coronary artery 03/23/2010   GERD 07/30/2008   History of colonic polyps 07/30/2008   Hyperlipidemia 01/31/2007   GOUT 01/31/2007   Essential hypertension, benign 01/31/2007    Past Surgical History:  Procedure Laterality Date   ABDOMINAL AORTIC ENDOVASCULAR STENT GRAFT  09-13-2007    dr [name]  [facility]   closure penetrating atherosclerotic ulcer of infarenal aorta with stent graft   BIOPSY  03/06/2019   Procedure: BIOPSY;  Surgeon: [name], MD;  Location: [facility] END[facility];  Service: Endoscopy;;  gastric   CARDIAC CATHETERIZATION  2005   per pt normal (done in [place])   [place]  10/06/2009    dr cooper   minimal nonobstructive CAD w/30% LAD otherwise normal coronaries, LVEDP 36mmHg   CARDIOVASCULAR STRESS TEST  09/27/2009    dr Aundra Dubin   lexiscan nuclear study w/ moderate reversible inferior perfusion defect ischemia,  ef 60% (cardiac cath scheduled)   CATARACT EXTRACTION W/PHACO Right 07/12/2020   Procedure: CATARACT EXTRACTION PHACO AND INTRAOCULAR LENS PLACEMENT RIGHT EYE;  Surgeon: [name], MD;  Location: [facility] ORS;  Service: Ophthalmology;  Laterality: Right;  CDE=15.48   CHEST TUBE INSERTION  1989   "collapsed lung"due to injury   CIRCUMCISION  09/26/2011   Procedure: CIRCUMCISION ADULT;  Surgeon: [name], MD;  Location: [facility];  Service: Urology;  Laterality: N/A;   COLONOSCOPY W/ POLYPECTOMY     COLONOSCOPY WITH PROPOFOL N/A 04/27/2016    eight 4-8 mm polyps in descending colon, at splenic flexure, in ascending colon and cecum. Tubular adenomas. Surveillance in 3 years.   CYSTOSCOPY  09/26/2011   Procedure: CYSTOSCOPY;  Surgeon: [name], MD;  Location: [facility];  Service: Urology;  Laterality: N/A;   CYSTOSCOPY/RETROGRADE/URETEROSCOPY Left 09/27/2017   Procedure: CYSTOSCOPYLEFT  /RETROGRADE/URETEROSCOPY AND RENAL WASHINGS;  Surgeon: [name], MD;  Location: [facility];  Service: Urology;  Laterality: Left;   ESOPHAGOGASTRODUODENOSCOPY (EGD) WITH PROPOFOL N/A 04/27/2016   normal esophagus s/p dilation, small hiatal hernia, normal duodenum   ESOPHAGOGASTRODUODENOSCOPY (EGD) WITH PROPOFOL N/A 03/06/2019   erosive reflux esophagitis, s/p dilation, normal duodenum, abnormal gastric mucosa s/p biopsy. Hyperplastic gastric polyp. No H.pylori.    EXTRACORPOREAL SHOCK WAVE LITHOTRIPSY  yrs ago   FRACTURE SURGERY  child   Bilateral lower arms    KNEE ARTHROSCOPY Right 2013   MALONEY DILATION N/A 04/27/2016   Procedure: Venia Minks DILATION;  Surgeon: [name], MD;  [facility];  Service: Endoscopy;  Laterality: N/A;   MALONEY DILATION N/A 03/06/2019   Procedure: Venia Minks DILATION;  Surgeon: [name],  MD;  [facility];  Service: Endoscopy;  Laterality: N/A;   PARTIAL KNEE ARTHROPLASTY Right 04/26/2015   Procedure: RIGHT KNEE MEDIAL UNICOMPARTMENTAL ARTHROPLASTY;  Surgeon: Gaynelle Arabian, MD;  [facility];  Service: Orthopedics;  Laterality: Right;   POLYPECTOMY  04/27/2016   Procedure: POLYPECTOMY;  Surgeon: [name], MD;  [facility];  Service: Endoscopy;;  cecal , ascending, descending, and sigmoid polypectomies   PROSTATE BIOPSY  09/26/2011   Procedure: BIOPSY TRANSRECTAL ULTRASONIC PROSTATE (TUBP);  Surgeon: [name], MD;  Location[facility];  Service: Urology;  Laterality: N/A;      TRANSTHORACIC ECHOCARDIOGRAM  01-07-2013   dr [name]   ef 60-65%,  grade 1 diastolic dysfunction/  mild AR without stenosis/  mild LAE/  mild to moderate calcificed MV annulus without regurg. or stenosis/   UMBILICAL HERNIA REPAIR  yrs ago       Family History  Problem Relation Age of Onset   Stroke Mother    Alzheimer's disease Mother 69       probably due to head injury   Mesothelioma Father 37   Kidney cancer  Father    Hyperlipidemia Sister    Heart attack Sister    Kidney cancer Sister        dx in her 70s   Heart disease Brother    Kidney cancer Brother 47       dx in his 28s   Hyperlipidemia Brother    Kidney cancer Brother        dx in his 64s   Healthy Daughter    Healthy Son    Healthy Son    Colon cancer Neg Hx     Social History   Tobacco Use   Smoking status: Every Day    Packs/day: 0.50    Years: 42.00    Pack years: 21.00    Types: Cigarettes   Smokeless tobacco: Never  Vaping Use   Vaping Use: Never used  Substance Use Topics   Alcohol use: No    Alcohol/week: 0.0 standard drinks   Drug use: No    Home Medications Prior to Admission medications   Medication Sig Start Date End Date Taking? Authorizing Provider  abiraterone acetate (ZYTIGA) 250 MG tablet Take 4 tablets (1,000 mg total) by mouth daily. Take on an empty stomach 1 hour before or 2 hours after a meal 08/20/20   Derek Jack, MD  allopurinol (ZYLOPRIM) 300 MG tablet Take 1 tablet by mouth once daily 07/15/20   [name], [name], MD  amLODipine (NORVASC) 10 MG tablet Take 1 tablet by mouth once daily 07/29/20   [name], [name], MD  carvedilol (COREG) 25 MG tablet Take 1 tablet by mouth twice daily 09/16/20   [name], [name], MD  DULoxetine (CYMBALTA) 60 MG capsule Take 1 capsule (60 mg total) by mouth daily. Take 1 capsule by mouth once daily (Needs to be seen before next refill) 05/19/20   [name], [name], MD  losartan (COZAAR) 50 MG tablet Take 1 tablet (50 mg total) by mouth daily. 05/19/20   [name], [name], MD  mirabegron ER (MYRBETRIQ) 25 MG TB24 tablet Take 1 tablet (25 mg total) by mouth daily. 04/29/20   [name], MD  nystatin (MYCOSTATIN/NYSTOP) powder Apply 1 application topically 3 (three) times daily. 04/19/20   [name], [name], MD  pantoprazole (PROTONIX) 40 MG tablet Take 40 mg by mouth 2 (two) times daily. 12/22/19   [provider]  potassium chloride SA  (KLOR-CON) 20 MEQ tablet Take 1 tablet (20 mEq total) by mouth 2 (two) times daily. 09/24/20   Harriett Rush, PA-C  predniSONE (DELTASONE) 5 MG tablet Take 1 tablet (5 mg total) by mouth daily with breakfast. 08/20/20   Derek Jack, MD  pregabalin (LYRICA) 25 MG capsule Take 1 capsule by mouth twice daily 05/04/20   Derek Jack, MD  rosuvastatin (CRESTOR) 20 MG tablet TAKE 1 TABLET BY MOUTH ONCE DAILY -  STOP  PRAVASTATIN -- NEEDS FOLLOW UP APPOINTMENT FOR REFILLS 09/30/20   Satira Sark, MD  tamsulosin (FLOMAX) 0.4 MG CAPS capsule Take 1 capsule (0.4 mg total) by mouth daily. 04/29/20   [name], MD  traMADol (ULTRAM) 50 MG tablet Take 1 tablet (50 mg total) by mouth daily as needed. 05/19/20   [name], [name], MD    Allergies    Carbidopa-levodopa, Morphine sulfate, Sulfa antibiotics, and Sulfacetamide sodium  Review of Systems   Review of Systems  Constitutional:  Negative for chills and fever.  HENT:  Negative for sore throat.   Eyes:  Negative for visual disturbance.  Respiratory:  Negative for cough and shortness of breath.   Cardiovascular:  Negative for chest pain.  Gastrointestinal:  Positive for abdominal pain and diarrhea. Negative for constipation, hematochezia, nausea and vomiting.  Genitourinary:  Negative for dysuria and hematuria.  Musculoskeletal:  Negative for neck pain.  Skin:  Negative for rash.  Neurological:  Negative for headaches.   Physical Exam Updated Vital Signs BP (!) 149/87 (BP Location: Right Arm)   Pulse 86   Temp 98.6 F (37 C) (Oral)   Resp 16   Ht 5\' 7"  (1.702 m)   Wt 117.9 kg   SpO2 98%   BMI 40.72 kg/m   Physical Exam Vitals and nursing note reviewed.  Constitutional:      Appearance: Normal appearance. He is well-developed.  HENT:     Head: Normocephalic and atraumatic.  Eyes:     Conjunctiva/sclera: Conjunctivae normal.  Cardiovascular:     Rate and Rhythm: Normal rate and regular rhythm.     Heart  sounds: No murmur heard. Pulmonary:     Effort: Pulmonary effort is normal. No respiratory distress.     Breath sounds: Normal breath sounds.  Abdominal:     Palpations: Abdomen is soft.     Tenderness: There is abdominal tenderness in the left lower quadrant. There is no guarding or rebound.  Musculoskeletal:        General: No tenderness.     Cervical back: Neck supple.     Right lower leg: Edema present.     Left lower leg: Edema present.  Skin:    General: Skin is warm and dry.  Neurological:     General: No focal deficit present.     Mental Status: He is alert.    ED Results / Procedures / Treatments   Labs (all labs ordered are listed, but only abnormal results are displayed) Labs Reviewed  COMPREHENSIVE METABOLIC PANEL - Abnormal; Notable for the following components:      Result Value   Potassium 3.0 (*)    Creatinine, Ser 1.83 (*)    Calcium 8.0 (*)    GFR, Estimated 38 (*)    All other components within normal limits  CBC - Abnormal; Notable for the following components:   RBC 3.81 (*)    Hemoglobin 11.9 (*)    HCT 35.5 (*)    All other  components within normal limits  URINALYSIS, ROUTINE W REFLEX MICROSCOPIC - Abnormal; Notable for the following components:   Hgb urine dipstick SMALL (*)    Protein, ur 100 (*)    All other components within normal limits  LIPASE, BLOOD    EKG None  Radiology CT Abdomen Pelvis Wo Contrast  Result Date: 10/07/2020 CLINICAL DATA:  Abdominal pain, prostate cancer on chemotherapy EXAM: CT ABDOMEN AND PELVIS WITHOUT CONTRAST TECHNIQUE: Multidetector CT imaging of the abdomen and pelvis was performed following the standard protocol without IV contrast. COMPARISON:  PET-CT dated 12/18/2019 FINDINGS: Lower chest: Lung bases clear. Hepatobiliary: Unenhanced liver is unremarkable. Layering small gallstones (series 2/image 31), without associated inflammatory changes. No intrahepatic or extrahepatic ductal dilatation. Pancreas: Within  normal limits. Spleen: Calcified splenic granulomata. Adrenals/Urinary Tract: Low-density thickening of the bilateral adrenal glands, suggesting adenomatous hypertrophy. Bilateral renal cysts, measuring up to 6.0 cm in the left lower pole (series 2/image 44). Two nonobstructing right renal calculi measuring up to 2 mm in the right lower pole (series 2/image 49). No hydronephrosis. Thick-walled bladder, although underdistended. Stomach/Bowel: Stomach is within normal limits. No evidence of bowel obstruction. Normal appendix (series 2/image 54). Vascular/Lymphatic: No evidence of abdominal aortic aneurysm. Aorto bi-iliac stent. Atherosclerotic calcifications of the abdominal aorta and branch vessels. No suspicious abdominopelvic lymphadenopathy. Reproductive: Fiducial markers along the left prostate. Other: No abdominopelvic ascites. Small fat containing left paramidline ventral hernia (series 2/image 52). Musculoskeletal: Degenerative changes of the visualized thoracolumbar spine. No focal osseous lesions. IMPRESSION: No evidence of bowel obstruction.  Normal appendix. Cholelithiasis, without associated inflammatory changes. Two nonobstructing right renal calculi measuring up to 2 mm. No hydronephrosis. Thick-walled bladder, although underdistended. Fiducial markers along the left prostate. No findings specific for metastatic disease. Electronically Signed   By: Julian Hy M.D.   On: 10/07/2020 23:34    Procedures Procedures   Medications Ordered in ED Medications  sodium chloride 0.9 % bolus 500 mL (0 mLs Intravenous Stopped 10/08/20 0005)  fentaNYL (SUBLIMAZE) injection 50 mcg (50 mcg Intravenous Given 10/07/20 2109)    ED Course  I have reviewed the triage vital signs and the nursing notes.  Pertinent labs & imaging results that were available during my care of the patient were reviewed by me and considered in my medical decision making (see chart for details).    MDM Rules/Calculators/A&P                          This patient complains of left lower quadrant pain and diarrhea in the setting of recent prostate cancer chemotherapy; this involves an extensive number of treatment Options and is a complaint that carries with it a high risk of complications and Morbidity. The differential includes diverticulitis, colitis, perforation, UTI renal stone, chemotherapy side effects  I ordered, reviewed and interpreted labs, which included CBC with normal white count hemoglobin low stable from priors, chemistries with low potassium elevated creatinine, creatinine similar to priors urinalysis without gross signs of infection I ordered medication IV fluids and pain medication I ordered imaging studies which included CT abdomen pelvis and results are pending at time of signout  Previous records obtained and reviewed in epic including prior oncology notes  After the interventions stated above, I reevaluated the patient and found patient to be hemodynamically stable and afebrile.  His care is signed out to oncoming provider Dr. Sedonia Small to follow-up on results of CT.  Disposition per results of testing   Final Clinical  Impression(s) / ED Diagnoses Final diagnoses:  Hernia of abdominal wall    Rx / DC Orders ED Discharge Orders     None        Hayden Rasmussen, MD 10/08/20 1118

## 2020-10-07 NOTE — Discharge Instructions (Addendum)
You were evaluated in the Emergency Department and after careful evaluation, we did not find any emergent condition requiring admission or further testing in the hospital.  Your exam/testing today was overall reassuring.  Symptoms may be due to a small fat-containing hernia of the abdominal wall.  This discomfort may resolve on its own.  If it persists we recommend follow-up with the general surgeon.  Please return to the Emergency Department if you experience any worsening of your condition.  Thank you for allowing [place] to be a part of your care.

## 2020-10-09 ENCOUNTER — Other Ambulatory Visit: Payer: Self-pay | Admitting: Cardiology

## 2020-10-11 NOTE — Telephone Encounter (Signed)
Patient states that diarrhea has resolved and that he has a newly diagnosed umbilical hernia, found at ER visit over the weekend.  Will continue on Zytiga and report any issues.

## 2020-10-14 ENCOUNTER — Encounter: Payer: Self-pay | Admitting: Family Medicine

## 2020-10-14 ENCOUNTER — Ambulatory Visit (INDEPENDENT_AMBULATORY_CARE_PROVIDER_SITE_OTHER): Payer: Medicare Other | Admitting: Family Medicine

## 2020-10-14 ENCOUNTER — Other Ambulatory Visit: Payer: Self-pay

## 2020-10-14 VITALS — BP 110/68 | HR 61 | Ht 67.0 in | Wt 259.0 lb

## 2020-10-14 DIAGNOSIS — I10 Essential (primary) hypertension: Secondary | ICD-10-CM

## 2020-10-14 MED ORDER — LOSARTAN POTASSIUM 25 MG PO TABS: 25.0000 mg | ORAL_TABLET | Freq: Every day | ORAL | 3 refills | Status: DC

## 2020-10-14 MED ORDER — AMLODIPINE BESYLATE 5 MG PO TABS: 5.0000 mg | ORAL_TABLET | Freq: Every day | ORAL | 3 refills | Status: DC

## 2020-10-14 NOTE — Progress Notes (Signed)
BP 110/68   Pulse 61   Ht 5\' 7"  (1.702 m)   Wt 259 lb (117.5 kg)   SpO2 96%   BMI 40.57 kg/m    Subjective:   Patient ID: [name], male    DOB: 15-Jul-1945, 75 y.o.   MRN: [id number]  HPI: TRAVAUGHN VUE is a 75 y.o. male presenting on 10/14/2020 for Hypertension and Dizziness   HPI Hypertension Patient is currently on amlodipine and losartan and carvedilol, and their blood pressure today is 110/68.  Patient is having lightheadedness dizziness and has had a couple falls.. Patient denies headaches, blurred vision, chest pains, shortness of breath, or weakness. Denies any side effects from medication and is content with current medication.   Relevant past medical, surgical, family and social history reviewed and updated as indicated. Interim medical history since our last visit reviewed. Allergies and medications reviewed and updated.  Review of Systems  Constitutional:  Negative for chills and fever.  Respiratory:  Negative for shortness of breath and wheezing.   Cardiovascular:  Negative for chest pain and leg swelling.  Musculoskeletal:  Negative for back pain and gait problem.  Skin:  Negative for rash.  Neurological:  Positive for dizziness and light-headedness.  All other systems reviewed and are negative.  Per HPI unless specifically indicated above   Allergies as of 10/14/2020       Reactions   Carbidopa-levodopa Other (See Comments)   hallunications   Morphine Sulfate Nausea And Vomiting   Sulfa Antibiotics Nausea And Vomiting   Sulfacetamide Sodium Nausea And Vomiting        Medication List        Accurate as of October 14, 2020 10:54 AM. If you have any questions, ask your nurse or doctor.          STOP taking these medications    predniSONE 5 MG tablet Commonly known as: DELTASONE Stopped by: [name] Jannelle Notaro, MD       TAKE these medications    abiraterone acetate 250 MG tablet Commonly known as: ZYTIGA Take 4 tablets (1,000  mg total) by mouth daily. Take on an empty stomach 1 hour before or 2 hours after a meal   allopurinol 300 MG tablet Commonly known as: ZYLOPRIM Take 1 tablet by mouth once daily   amLODipine 5 MG tablet Commonly known as: NORVASC Take 1 tablet (5 mg total) by mouth daily. What changed:  medication strength how much to take Changed by: Worthy Rancher, MD   carvedilol 25 MG tablet Commonly known as: COREG Take 1 tablet by mouth twice daily   DULoxetine 60 MG capsule Commonly known as: CYMBALTA Take 1 capsule (60 mg total) by mouth daily. Take 1 capsule by mouth once daily (Needs to be seen before next refill)   losartan 25 MG tablet Commonly known as: Cozaar Take 1 tablet (25 mg total) by mouth daily. What changed:  medication strength how much to take Changed by: Worthy Rancher, MD   mirabegron ER 25 MG Tb24 tablet Commonly known as: MYRBETRIQ Take 1 tablet (25 mg total) by mouth daily.   nystatin powder Commonly known as: MYCOSTATIN/NYSTOP Apply 1 application topically 3 (three) times daily.   pantoprazole 40 MG tablet Commonly known as: PROTONIX Take 40 mg by mouth 2 (two) times daily.   potassium chloride SA 20 MEQ tablet Commonly known as: KLOR-CON Take 1 tablet (20 mEq total) by mouth 2 (two) times daily.   pregabalin 25 MG capsule  Commonly known as: LYRICA Take 1 capsule by mouth twice daily   rosuvastatin 20 MG tablet Commonly known as: CRESTOR TAKE 1 TABLET BY MOUTH ONCE DAILY -  STOP  PRAVASTATIN -- NEEDS FOLLOW UP APPOINTMENT FOR REFILLS   tamsulosin 0.4 MG Caps capsule Commonly known as: FLOMAX Take 1 capsule (0.4 mg total) by mouth daily.   traMADol 50 MG tablet Commonly known as: ULTRAM Take 1 tablet (50 mg total) by mouth daily as needed.         Objective:   BP 110/68   Pulse 61   Ht 5\' 7"  (1.702 m)   Wt 259 lb (117.5 kg)   SpO2 96%   BMI 40.57 kg/m   Wt Readings from Last 3 Encounters:  10/14/20 259 lb (117.5 kg)   10/07/20 260 lb (117.9 kg)  09/28/20 260 lb (117.9 kg)    Physical Exam Vitals and nursing note reviewed.  Constitutional:      General: He is not in acute distress.    Appearance: He is well-developed. He is not diaphoretic.  Eyes:     General: No scleral icterus.    Conjunctiva/sclera: Conjunctivae normal.  Neck:     Thyroid: No thyromegaly.  Cardiovascular:     Rate and Rhythm: Normal rate and regular rhythm.     Heart sounds: Normal heart sounds. No murmur heard. Pulmonary:     Effort: Pulmonary effort is normal. No respiratory distress.     Breath sounds: Normal breath sounds. No wheezing.  Musculoskeletal:        General: Normal range of motion.     Cervical back: Neck supple.  Lymphadenopathy:     Cervical: No cervical adenopathy.  Skin:    General: Skin is warm and dry.     Findings: No rash.  Neurological:     Mental Status: He is alert and oriented to person, place, and time.     Coordination: Coordination normal.  Psychiatric:        Behavior: Behavior normal.      Assessment & Plan:   Problem List Items Addressed This Visit       Cardiovascular and Mediastinum   Essential hypertension, benign - Primary   Relevant Medications   losartan (COZAAR) 25 MG tablet   amLODipine (NORVASC) 5 MG tablet    We will lower to of his blood pressure medicines, lower the amlodipine to 5 mg and lower the losartan to 25 mg and sent new prescription for him.  In the meantime he can take half a tablet of the losartan and half a tablet of the amlodipine every day instead of a whole tablet Follow up plan: Return in about 2 months (around 12/15/2020), or if symptoms worsen or fail to improve, for Blood pressure recheck.  Counseling provided for all of the vaccine components No orders of the defined types were placed in this encounter.   Caryl Pina, MD [facility] 10/14/2020, 10:54 AM

## 2020-10-14 NOTE — Patient Instructions (Signed)
We will lower to of his blood pressure medicines, lower the amlodipine to 5 mg and lower the losartan to 25 mg and sent new prescription for him.  In the meantime he can take half a tablet of the losartan and half a tablet of the amlodipine every day instead of a whole tablet

## 2020-10-26 ENCOUNTER — Inpatient Hospital Stay (HOSPITAL_COMMUNITY): Payer: Medicare Other | Attending: Hematology

## 2020-10-26 ENCOUNTER — Other Ambulatory Visit: Payer: Self-pay

## 2020-10-26 DIAGNOSIS — N184 Chronic kidney disease, stage 4 (severe): Secondary | ICD-10-CM | POA: Insufficient documentation

## 2020-10-26 DIAGNOSIS — Z79899 Other long term (current) drug therapy: Secondary | ICD-10-CM | POA: Diagnosis not present

## 2020-10-26 DIAGNOSIS — G2581 Restless legs syndrome: Secondary | ICD-10-CM | POA: Insufficient documentation

## 2020-10-26 DIAGNOSIS — F17210 Nicotine dependence, cigarettes, uncomplicated: Secondary | ICD-10-CM | POA: Insufficient documentation

## 2020-10-26 DIAGNOSIS — G4733 Obstructive sleep apnea (adult) (pediatric): Secondary | ICD-10-CM | POA: Diagnosis not present

## 2020-10-26 DIAGNOSIS — M25561 Pain in right knee: Secondary | ICD-10-CM | POA: Diagnosis not present

## 2020-10-26 DIAGNOSIS — C61 Malignant neoplasm of prostate: Secondary | ICD-10-CM | POA: Insufficient documentation

## 2020-10-26 LAB — COMPREHENSIVE METABOLIC PANEL
ALT: 14 U/L (ref 0–44)
AST: 12 U/L — ABNORMAL LOW (ref 15–41)
Albumin: 3.4 g/dL — ABNORMAL LOW (ref 3.5–5.0)
Alkaline Phosphatase: 62 U/L (ref 38–126)
Anion gap: 7 (ref 5–15)
BUN: 35 mg/dL — ABNORMAL HIGH (ref 8–23)
CO2: 22 mmol/L (ref 22–32)
Calcium: 8.1 mg/dL — ABNORMAL LOW (ref 8.9–10.3)
Chloride: 107 mmol/L (ref 98–111)
Creatinine, Ser: 2.45 mg/dL — ABNORMAL HIGH (ref 0.61–1.24)
GFR, Estimated: 27 mL/min — ABNORMAL LOW (ref >60)
Glucose, Bld: 122 mg/dL — ABNORMAL HIGH (ref 70–99)
Potassium: 3.5 mmol/L (ref 3.5–5.1)
Sodium: 136 mmol/L (ref 135–145)
Total Bilirubin: 0.6 mg/dL (ref 0.3–1.2)
Total Protein: 6.6 g/dL (ref 6.5–8.1)

## 2020-10-26 LAB — CBC WITH DIFFERENTIAL/PLATELET
Abs Immature Granulocytes: 0.02 10*3/uL (ref 0.00–0.07)
Basophils Absolute: 0 10*3/uL (ref 0.0–0.1)
Basophils Relative: 1 %
Eosinophils Absolute: 0.3 10*3/uL (ref 0.0–0.5)
Eosinophils Relative: 7 %
HCT: 35 % — ABNORMAL LOW (ref 39.0–52.0)
Hemoglobin: 11.3 g/dL — ABNORMAL LOW (ref 13.0–17.0)
Immature Granulocytes: 0 %
Lymphocytes Relative: 12 %
Lymphs Abs: 0.6 10*3/uL — ABNORMAL LOW (ref 0.7–4.0)
MCH: 30.5 pg (ref 26.0–34.0)
MCHC: 32.3 g/dL (ref 30.0–36.0)
MCV: 94.6 fL (ref 80.0–100.0)
Monocytes Absolute: 0.3 10*3/uL (ref 0.1–1.0)
Monocytes Relative: 6 %
Neutro Abs: 3.8 10*3/uL (ref 1.7–7.7)
Neutrophils Relative %: 74 %
Platelets: 178 10*3/uL (ref 150–400)
RBC: 3.7 MIL/uL — ABNORMAL LOW (ref 4.22–5.81)
RDW: 14.6 % (ref 11.5–15.5)
WBC: 5.1 10*3/uL (ref 4.0–10.5)
nRBC: 0 % (ref 0.0–0.2)

## 2020-10-26 LAB — PSA: Prostatic Specific Antigen: 0.6 ng/mL (ref 0.00–4.00)

## 2020-10-27 ENCOUNTER — Encounter (HOSPITAL_COMMUNITY): Payer: Self-pay | Admitting: Hematology and Oncology

## 2020-10-27 ENCOUNTER — Inpatient Hospital Stay (HOSPITAL_BASED_OUTPATIENT_CLINIC_OR_DEPARTMENT_OTHER): Payer: Medicare Other | Admitting: Hematology and Oncology

## 2020-10-27 DIAGNOSIS — G8929 Other chronic pain: Secondary | ICD-10-CM

## 2020-10-27 DIAGNOSIS — F17210 Nicotine dependence, cigarettes, uncomplicated: Secondary | ICD-10-CM | POA: Diagnosis not present

## 2020-10-27 DIAGNOSIS — N184 Chronic kidney disease, stage 4 (severe): Secondary | ICD-10-CM

## 2020-10-27 DIAGNOSIS — C61 Malignant neoplasm of prostate: Secondary | ICD-10-CM | POA: Diagnosis not present

## 2020-10-27 DIAGNOSIS — G2581 Restless legs syndrome: Secondary | ICD-10-CM | POA: Diagnosis not present

## 2020-10-27 DIAGNOSIS — M25561 Pain in right knee: Secondary | ICD-10-CM | POA: Diagnosis not present

## 2020-10-27 DIAGNOSIS — G4733 Obstructive sleep apnea (adult) (pediatric): Secondary | ICD-10-CM | POA: Diagnosis not present

## 2020-10-27 LAB — TESTOSTERONE: Testosterone: <3 ng/dL — ABNORMAL LOW (ref 264–916)

## 2020-10-27 MED ORDER — ABIRATERONE ACETATE 250 MG PO TABS: 500.0000 mg | ORAL_TABLET | Freq: Every day | ORAL | 3 refills | Status: DC

## 2020-10-27 NOTE — Assessment & Plan Note (Signed)
He has right knee pain I recommend against taking NSAID I recommend topical cream and acetaminophen only

## 2020-10-27 NOTE — Progress Notes (Signed)
[place] progress notes  Patient Care Team: [name], [name], MD as PCP - General (Family Medicine) Satira Sark, MD as PCP - Cardiology (Cardiology) Satira Sark, MD as Consulting Physician (Cardiology) Ilean China, RN as Registered Nurse Derek Jack, MD as Medical Oncologist (Medical Oncology)  CHIEF COMPLAINTS/PURPOSE OF VISIT:  Prostate cancer, on Zytiga  HISTORY OF PRESENTING ILLNESS:  [name] 75 y.o. male seen because his primary oncologist is not available The patient had recent severe side effects from [facility] with profound diarrhea and abdominal pain He went to the emergency department and had CT imaging which showed hernia His diarrhea stopped when he stopped taking Zytiga He admits he is not drinking enough fluid; he drinks approximately 40 to 50 ounces of water per day He had recent right knee pain and take Aleve as needed  MEDICAL HISTORY:  Past Medical History:  Diagnosis Date   Aortic atherosclerosis ([place])    2004  s/p  closure Penetrating atherosclerotic ulcer of infarenal aorta w/ endovascular stent graft   Arthritis    CKD (chronic kidney disease), stage III ([place])    Closed fracture of head of humerus 07/2017   right shoulder from fall; 09-25-2017 per pt no surgical intervention, only wore sling, intermittant pain   Coronary atherosclerosis of native coronary artery    positive myoview for ischemia 09-27-2009;  10-01-2009 per cardiac cath-- minimal nonobstructive CAD w/ 30% LAD    ED (erectile dysfunction)    Emphysema/COPD ([place])    followed by pcp--- last exacerbation 09-12-2017;  09-25-2017 per pt no cough, sob or congestion   Essential hypertension    Family history of kidney cancer    First degree heart block    Frequency of urination    GERD (gastroesophageal reflux disease)    Gout    09-25-2017  per pt stable,  last episode 2015   Heart murmur    History of adenomatous polyp of colon     History of closed head injury 2005   per pt residual resolved   History of external beam radiation therapy    completed 10/ 2015 for prostate cancer   History of gastric ulcer 2014   History of kidney stones    History of pneumothorax    spontaneous pneumo treated w/ chest tube   History of rib fracture 07/2017   from fall,  right side 6th,7th,8th   Neuropathy    OAB (overactive bladder)    OSA (obstructive sleep apnea)    Intolerant of CPAP   Prostate cancer ([place])    dx 09-26-2011 via bx-- Stage T3b,N0,  Gleason 4+4, PSA 11.2 with METs to external iliac lymph node(resolved with ADT)-- treated w/ ADT ;   05/2013 staging work-up for rising PSA , started casodex and completed radiation therapy 10/ 2015;   rising PSA post treatment   RLS (restless legs syndrome)    Ureteral neocystostomy bleed    left side   Urge urinary incontinence     SURGICAL HISTORY: Past Surgical History:  Procedure Laterality Date   ABDOMINAL AORTIC ENDOVASCULAR STENT GRAFT  09-13-2007    dr [name]  [facility]   closure penetrating atherosclerotic ulcer of infarenal aorta with stent graft   BIOPSY  03/06/2019   Procedure: BIOPSY;  Surgeon: [name], MD;  [facility];  Service: Endoscopy;;  gastric   CARDIAC CATHETERIZATION  2005   per pt normal (done in [place])   [place]  10/06/2009  dr cooper   minimal nonobstructive CAD w/30% LAD otherwise normal coronaries, LVEDP 47mmHg   CARDIOVASCULAR STRESS TEST  09/27/2009    dr Aundra Dubin   lexiscan nuclear study w/ moderate reversible inferior perfusion defect ischemia,  ef 60% (cardiac cath scheduled)   CATARACT EXTRACTION W/PHACO Right 07/12/2020   Procedure: CATARACT EXTRACTION PHACO AND INTRAOCULAR LENS PLACEMENT RIGHT EYE;  Surgeon: [name], MD;  Location: [facility];  Service: Ophthalmology;  Laterality: Right;  CDE=15.48   CHEST TUBE INSERTION  1989   "collapsed lung"due to injury   CIRCUMCISION  09/26/2011   Procedure:  CIRCUMCISION ADULT;  Surgeon: [name], MD;  Location: [facility];  Service: Urology;  Laterality: N/A;   COLONOSCOPY W/ POLYPECTOMY     COLONOSCOPY WITH PROPOFOL N/A 04/27/2016    eight 4-8 mm polyps in descending colon, at splenic flexure, in ascending colon and cecum. Tubular adenomas. Surveillance in 3 years.   CYSTOSCOPY  09/26/2011   Procedure: CYSTOSCOPY;  Surgeon: [name], MD;  [facility]: [facility];  Service: Urology;  Laterality: N/A;   CYSTOSCOPY/RETROGRADE/URETEROSCOPY Left 09/27/2017   Procedure: CYSTOSCOPYLEFT /RETROGRADE/URETEROSCOPY AND RENAL WASHINGS;  Surgeon: [name], MD;  Location: [facility];  Service: Urology;  Laterality: Left;   ESOPHAGOGASTRODUODENOSCOPY (EGD) WITH PROPOFOL N/A 04/27/2016   normal esophagus s/p dilation, small hiatal hernia, normal duodenum   ESOPHAGOGASTRODUODENOSCOPY (EGD) WITH PROPOFOL N/A 03/06/2019   erosive reflux esophagitis, s/p dilation, normal duodenum, abnormal gastric mucosa s/p biopsy. Hyperplastic gastric polyp. No H.pylori.    EXTRACORPOREAL SHOCK WAVE LITHOTRIPSY  yrs ago   FRACTURE SURGERY  child   Bilateral lower arms    KNEE ARTHROSCOPY Right 2013   MALONEY DILATION N/A 04/27/2016   Procedure: Venia Minks DILATION;  Surgeon: [name], MD;  [facility];  Service: Endoscopy;  Laterality: N/A;   MALONEY DILATION N/A 03/06/2019   Procedure: Venia Minks DILATION;  Surgeon: [name], MD;  [facility];  Service: Endoscopy;  Laterality: N/A;   PARTIAL KNEE ARTHROPLASTY Right 04/26/2015   Procedure: RIGHT KNEE MEDIAL UNICOMPARTMENTAL ARTHROPLASTY;  Surgeon: Gaynelle Arabian, MD;  [facility];  Service: Orthopedics;  Laterality: Right;   POLYPECTOMY  04/27/2016   Procedure: POLYPECTOMY;  Surgeon: [name], MD;  [facility];  Service: Endoscopy;;  cecal , ascending, descending, and sigmoid polypectomies   PROSTATE BIOPSY  09/26/2011   Procedure: BIOPSY TRANSRECTAL ULTRASONIC PROSTATE  (TUBP);  Surgeon: [name], MD;  [facility];  Service: Urology;  Laterality: N/A;      TRANSTHORACIC ECHOCARDIOGRAM  01-07-2013   dr [name]   ef 60-65%,  grade 1 diastolic dysfunction/  mild AR without stenosis/  mild LAE/  mild to moderate calcificed MV annulus without regurg. or stenosis/   UMBILICAL HERNIA REPAIR  yrs ago    SOCIAL HISTORY: Social History   Socioeconomic History   Marital status: Married    Spouse name: Not on file   Number of children: 3   Years of education: 12   Highest education level: Some college, no degree  Occupational History   Occupation: Retired    Comment: Hotel manager    Comment: Part Time at [employer] Use   Smoking status: Every Day    Packs/day: 0.50    Years: 42.00    Pack years: 21.00    Types: Cigarettes   Smokeless tobacco: Never  Vaping Use   Vaping Use: Never used  Substance and Sexual Activity  Alcohol use: No    Alcohol/week: 0.0 standard drinks   Drug use: No   Sexual activity: Yes    Birth control/protection: None  Other Topics Concern   Not on file  Social History Narrative   Patient is retired from the department of [employer]. He lives in a one story home with his wife. He moved from [facility] about 9 years ago. They do not have family on the [place] and although he is very outgoing he feels socially isolated. He isn't a member of any groups or churches.    Social Determinants of Health   Financial Resource Strain: High Risk   Difficulty of Paying Living Expenses: Hard  Food Insecurity: Not on file  Transportation Needs: No Transportation Needs   Lack of Transportation (Medical): No   Lack of Transportation (Non-Medical): No  Physical Activity: Not on file  Stress: Not on file  Social Connections: Not on file  Intimate Partner Violence: Not At Risk   Fear of Current or Ex-Partner: No   Emotionally Abused: No   Physically Abused: No   Sexually Abused: No    FAMILY HISTORY: Family  History  Problem Relation Age of Onset   Stroke Mother    Alzheimer's disease Mother 27       probably due to head injury   Mesothelioma Father 56   Kidney cancer Father    Hyperlipidemia Sister    Heart attack Sister    Kidney cancer Sister        dx in her 45s   Heart disease Brother    Kidney cancer Brother 23       dx in his 54s   Hyperlipidemia Brother    Kidney cancer Brother        dx in his 62s   Healthy Daughter    Healthy Son    Healthy Son    Colon cancer Neg Hx     ALLERGIES:  is allergic to carbidopa-levodopa, morphine sulfate, sulfa antibiotics, and sulfacetamide sodium.  MEDICATIONS:  Current Outpatient Medications  Medication Sig Dispense Refill   allopurinol (ZYLOPRIM) 300 MG tablet Take 1 tablet by mouth once daily 90 tablet 0   amLODipine (NORVASC) 5 MG tablet Take 1 tablet (5 mg total) by mouth daily. 90 tablet 3   carvedilol (COREG) 25 MG tablet Take 1 tablet by mouth twice daily 180 tablet 0   DULoxetine (CYMBALTA) 60 MG capsule Take 1 capsule (60 mg total) by mouth daily. Take 1 capsule by mouth once daily (Needs to be seen before next refill) 90 capsule 3   losartan (COZAAR) 25 MG tablet Take 1 tablet (25 mg total) by mouth daily. 90 tablet 3   mirabegron ER (MYRBETRIQ) 25 MG TB24 tablet Take 1 tablet (25 mg total) by mouth daily. 28 tablet 0   nystatin (MYCOSTATIN/NYSTOP) powder Apply 1 application topically 3 (three) times daily. 15 g 0   pantoprazole (PROTONIX) 40 MG tablet Take 40 mg by mouth 2 (two) times daily.     potassium chloride SA (KLOR-CON) 20 MEQ tablet Take 1 tablet (20 mEq total) by mouth 2 (two) times daily. 60 tablet 3   pregabalin (LYRICA) 25 MG capsule Take 1 capsule by mouth twice daily 60 capsule 3   rosuvastatin (CRESTOR) 20 MG tablet TAKE 1 TABLET BY MOUTH ONCE DAILY -  STOP  PRAVASTATIN -- NEEDS FOLLOW UP APPOINTMENT FOR REFILLS 15 tablet 0   tamsulosin (FLOMAX) 0.4 MG CAPS capsule Take 1 capsule (0.4 mg  total) by mouth daily. 90  capsule 3   traMADol (ULTRAM) 50 MG tablet Take 1 tablet (50 mg total) by mouth daily as needed. 30 tablet 1   abiraterone acetate (ZYTIGA) 250 MG tablet Take 2 tablets (500 mg total) by mouth daily. Take on an empty stomach 1 hour before or 2 hours after a meal 120 tablet 3   No current facility-administered medications for this visit.    REVIEW OF SYSTEMS:   Constitutional: Denies fevers, chills or abnormal night sweats Eyes: Denies blurriness of vision, double vision or watery eyes Ears, nose, mouth, throat, and face: Denies mucositis or sore throat Respiratory: Denies cough, dyspnea or wheezes Cardiovascular: Denies palpitation, chest discomfort or lower extremity swelling Skin: Denies abnormal skin rashes Lymphatics: Denies new lymphadenopathy or easy bruising Neurological:Denies numbness, tingling or new weaknesses Behavioral/Psych: Mood is stable, no new changes  All other systems were reviewed with the patient and are negative.  PHYSICAL EXAMINATION: ECOG PERFORMANCE STATUS: 2 - Symptomatic, <50% confined to bed  Vitals:   10/27/20 1018  BP: 131/72  Pulse: (!) 57  Resp: 18  Temp: (!) 97.4 F (36.3 C)  SpO2: 99%   Filed Weights   10/27/20 1018  Weight: 252 lb (114.3 kg)    GENERAL:alert, no distress and comfortable NEURO: no focal motor/sensory deficits  LABORATORY DATA:  I have reviewed the data as listed Lab Results  Component Value Date   WBC 5.1 10/26/2020   HGB 11.3 (L) 10/26/2020   HCT 35.0 (L) 10/26/2020   MCV 94.6 10/26/2020   PLT 178 10/26/2020   Recent Labs    09/24/20 1246 10/07/20 2001 10/26/20 1014  NA 135 138 136  K 3.3* 3.0* 3.5  CL 107 108 107  CO2 22 23 22   GLUCOSE 119* 88 122*  BUN 23 15 35*  CREATININE 2.13* 1.83* 2.45*  CALCIUM 8.3* 8.0* 8.1*  GFRNONAA 32* 38* 27*  PROT 6.4* 6.7 6.6  ALBUMIN 3.3* 3.5 3.4*  AST 13* 16 12*  ALT 10 11 14   ALKPHOS 65 71 62  BILITOT 0.7 0.3 0.6    RADIOGRAPHIC STUDIES: I have personally  reviewed the radiological images as listed and agreed with the findings in the report. CT Abdomen Pelvis Wo Contrast  Result Date: 10/07/2020 CLINICAL DATA:  Abdominal pain, prostate cancer on chemotherapy EXAM: CT ABDOMEN AND PELVIS WITHOUT CONTRAST TECHNIQUE: Multidetector CT imaging of the abdomen and pelvis was performed following the standard protocol without IV contrast. COMPARISON:  PET-CT dated 12/18/2019 FINDINGS: Lower chest: Lung bases clear. Hepatobiliary: Unenhanced liver is unremarkable. Layering small gallstones (series 2/image 31), without associated inflammatory changes. No intrahepatic or extrahepatic ductal dilatation. Pancreas: Within normal limits. Spleen: Calcified splenic granulomata. Adrenals/Urinary Tract: Low-density thickening of the bilateral adrenal glands, suggesting adenomatous hypertrophy. Bilateral renal cysts, measuring up to 6.0 cm in the left lower pole (series 2/image 44). Two nonobstructing right renal calculi measuring up to 2 mm in the right lower pole (series 2/image 49). No hydronephrosis. Thick-walled bladder, although underdistended. Stomach/Bowel: Stomach is within normal limits. No evidence of bowel obstruction. Normal appendix (series 2/image 54). Vascular/Lymphatic: No evidence of abdominal aortic aneurysm. Aorto bi-iliac stent. Atherosclerotic calcifications of the abdominal aorta and branch vessels. No suspicious abdominopelvic lymphadenopathy. Reproductive: Fiducial markers along the left prostate. Other: No abdominopelvic ascites. Small fat containing left paramidline ventral hernia (series 2/image 52). Musculoskeletal: Degenerative changes of the visualized thoracolumbar spine. No focal osseous lesions. IMPRESSION: No evidence of bowel obstruction.  Normal appendix. Cholelithiasis, without  associated inflammatory changes. Two nonobstructing right renal calculi measuring up to 2 mm. No hydronephrosis. Thick-walled bladder, although underdistended. Fiducial  markers along the left prostate. No findings specific for metastatic disease. Electronically Signed   By: Julian Hy M.D.   On: 10/07/2020 23:34    ASSESSMENT & PLAN:  Prostate cancer [facility]) He tolerated Zytiga poorly His recent treatment course was complicated by severe diarrhea, causing acute on chronic renal failure He is not ready to resume I recommend aggressive supportive care with increase oral fluid hydration I recommend him to restart Zytiga on August 1 at 500 mg daily and then reassess the following week  Chronic kidney disease (CKD), stage IV (severe) ([place]) I reviewed test results with him and gave him copies of his blood work He has recent acute on chronic renal failure likely secondary to dehydration I recommend the patient to refrain from taking NSAID I recommend he drink 90 ounces of water per day if possible He will resume treatment the following week and hopefully with increase oral fluid intake and lower dose of Zytiga, we can stabilize his kidney function  Right knee pain He has right knee pain I recommend against taking NSAID I recommend topical cream and acetaminophen only  No orders of the defined types were placed in this encounter.   All questions were answered. The patient knows to call the clinic with any problems, questions or concerns. The total time spent in the appointment was 20 minutes encounter with patients including review of chart and various tests results, discussions about plan of care and coordination of care plan   Heath Lark, MD 10/27/2020 11:17 AM

## 2020-10-27 NOTE — Patient Instructions (Signed)
1) Drink 90 ounces of water a day 2) Hold Zytiga for now, resume next week on August 1st at reduced dose; take 2 pills per day instead of 3 3) Return on 8/8 for labs and evaluation

## 2020-10-27 NOTE — Assessment & Plan Note (Signed)
I reviewed test results with him and gave him copies of his blood work He has recent acute on chronic renal failure likely secondary to dehydration I recommend the patient to refrain from taking NSAID I recommend he drink 90 ounces of water per day if possible He will resume treatment the following week and hopefully with increase oral fluid intake and lower dose of Zytiga, we can stabilize his kidney function

## 2020-10-27 NOTE — Assessment & Plan Note (Signed)
He tolerated Zytiga poorly His recent treatment course was complicated by severe diarrhea, causing acute on chronic renal failure He is not ready to resume I recommend aggressive supportive care with increase oral fluid hydration I recommend him to restart Zytiga on August 1 at 500 mg daily and then reassess the following week

## 2020-11-01 ENCOUNTER — Other Ambulatory Visit: Payer: Self-pay

## 2020-11-01 ENCOUNTER — Ambulatory Visit (INDEPENDENT_AMBULATORY_CARE_PROVIDER_SITE_OTHER): Payer: Medicare Other | Admitting: Family Medicine

## 2020-11-01 ENCOUNTER — Encounter: Payer: Self-pay | Admitting: Family Medicine

## 2020-11-01 VITALS — BP 122/78 | HR 69 | Temp 97.1°F | Resp 20 | Ht 67.0 in | Wt 257.0 lb

## 2020-11-01 DIAGNOSIS — K219 Gastro-esophageal reflux disease without esophagitis: Secondary | ICD-10-CM | POA: Diagnosis not present

## 2020-11-01 DIAGNOSIS — R1032 Left lower quadrant pain: Secondary | ICD-10-CM | POA: Diagnosis not present

## 2020-11-01 MED ORDER — PANTOPRAZOLE SODIUM 40 MG PO TBEC: 40.0000 mg | DELAYED_RELEASE_TABLET | Freq: Two times a day (BID) | ORAL | 0 refills | Status: DC

## 2020-11-01 NOTE — Patient Instructions (Signed)
[employer]'s Current Therapy 2021 (pp. 213-216). [place], [place]: Elsevier.">  Gastroesophageal Reflux Disease, Adult Gastroesophageal reflux (GER) happens when acid from the stomach flows up into the tube that connects the mouth and the stomach (esophagus). Normally, food travels down the esophagus and stays in the stomach to be digested. However, when a person has GER, food and stomach acid sometimes move back up into the esophagus. If this becomes a more serious problem, the person may be diagnosed with a disease called gastroesophageal reflux disease (GERD). GERD occurs when the reflux: Happens often. Causes frequent or severe symptoms. Causes problems such as damage to the esophagus. When stomach acid comes in contact with the esophagus, the acid may cause inflammation in the esophagus. Over time, GERD may create small holes (ulcers) in the lining of the esophagus. What are the causes? This condition is caused by a problem with the muscle between the esophagus and the stomach (lower esophageal sphincter, or LES). Normally, the LES muscle closes after food passes through the esophagus to the stomach. When the LES is weakened or abnormal, it does not close properly, and that allows food and stomach acid to go back up into theesophagus. The LES can be weakened by certain dietary substances, medicines, and medical conditions, including: Tobacco use. Pregnancy. Having a hiatal hernia. Alcohol use. Certain foods and beverages, such as coffee, chocolate, onions, and peppermint. What increases the risk? You are more likely to develop this condition if you: Have an increased body weight. Have a connective tissue disorder. Take NSAIDs, such as ibuprofen. What are the signs or symptoms? Symptoms of this condition include: Heartburn. Difficult or painful swallowing and the feeling of having a lump in the throat. A bitter taste in the mouth. Bad breath and having a large amount of saliva. Having an  upset or bloated stomach and belching. Chest pain. Different conditions can cause chest pain. Make sure you see your health care provider if you experience chest pain. Shortness of breath or wheezing. Ongoing (chronic) cough or a nighttime cough. Wearing away of tooth enamel. Weight loss. How is this diagnosed? This condition may be diagnosed based on a medical history and a physical exam. To determine if you have mild or severe GERD, your health care provider may also monitor how you respond to treatment. You may also have tests, including: A test to examine your stomach and esophagus with a small camera (endoscopy). A test that measures the acidity level in your esophagus. A test that measures how much pressure is on your esophagus. A barium swallow or modified barium swallow test to show the shape, size, and functioning of your esophagus. How is this treated? Treatment for this condition may vary depending on how severe your symptoms are. Your health care provider may recommend: Changes to your diet. Medicine. Surgery. The goal of treatment is to help relieve your symptoms and to preventcomplications. Follow these instructions at home: Eating and drinking  Follow a diet as recommended by your health care provider. This may involve avoiding foods and drinks such as: Coffee and tea, with or without caffeine. Drinks that contain alcohol. Energy drinks and sports drinks. Carbonated drinks or sodas. Chocolate and cocoa. Peppermint and mint flavorings. Garlic and onions. Horseradish. Spicy and acidic foods, including peppers, chili powder, curry powder, vinegar, hot sauces, and barbecue sauce. Citrus fruit juices and citrus fruits, such as oranges, lemons, and limes. Tomato-based foods, such as red sauce, chili, salsa, and pizza with red sauce. Fried and fatty foods, such as   donuts, french fries, potato chips, and high-fat dressings. High-fat meats, such as hot dogs and fatty cuts of  red and white meats, such as rib eye steak, sausage, ham, and bacon. High-fat dairy items, such as whole milk, butter, and cream cheese. Eat small, frequent meals instead of large meals. Avoid drinking large amounts of liquid with your meals. Avoid eating meals during the 2-3 hours before bedtime. Avoid lying down right after you eat. Do not exercise right after you eat.  Lifestyle  Do not use any products that contain nicotine or tobacco. These products include cigarettes, chewing tobacco, and vaping devices, such as e-cigarettes. If you need help quitting, ask your health care provider. Try to reduce your stress by using methods such as yoga or meditation. If you need help reducing stress, ask your health care provider. If you are overweight, reduce your weight to an amount that is healthy for you. Ask your health care provider for guidance about a safe weight loss goal.  General instructions Pay attention to any changes in your symptoms. Take over-the-counter and prescription medicines only as told by your health care provider. Do not take aspirin, ibuprofen, or other NSAIDs unless your health care provider told you to take these medicines. Wear loose-fitting clothing. Do not wear anything tight around your waist that causes pressure on your abdomen. Raise (elevate) the head of your bed about 6 inches (15 cm). You can use a wedge to do this. Avoid bending over if this makes your symptoms worse. Keep all follow-up visits. This is important. Contact a health care provider if: You have: New symptoms. Unexplained weight loss. Difficulty swallowing or it hurts to swallow. Wheezing or a persistent cough. A hoarse voice. Your symptoms do not improve with treatment. Get help right away if: You have sudden pain in your arms, neck, jaw, teeth, or back. You suddenly feel sweaty, dizzy, or light-headed. You have chest pain or shortness of breath. You vomit and the vomit is green, yellow, or  black, or it looks like blood or coffee grounds. You faint. You have stool that is red, bloody, or black. You cannot swallow, drink, or eat. These symptoms may represent a serious problem that is an emergency. Do not wait to see if the symptoms will go away. Get medical help right away. Call your local emergency services (911 in the [place] Do not drive yourself to the hospital. Summary Gastroesophageal reflux happens when acid from the stomach flows up into the esophagus. GERD is a disease in which the reflux happens often, causes frequent or severe symptoms, or causes problems such as damage to the esophagus. Treatment for this condition may vary depending on how severe your symptoms are. Your health care provider may recommend diet and lifestyle changes, medicine, or surgery. Contact a health care provider if you have new or worsening symptoms. Take over-the-counter and prescription medicines only as told by your health care provider. Do not take aspirin, ibuprofen, or other NSAIDs unless your health care provider told you to do so. Keep all follow-up visits as told by your health care provider. This is important. This information is not intended to replace advice given to you by your health care provider. Make sure you discuss any questions you have with your healthcare provider. Document Revised: 09/29/2019 Document Reviewed: 09/29/2019 Elsevier Patient Education  2022 [employer] Inc.  

## 2020-11-01 NOTE — Progress Notes (Signed)
Acute Office Visit  Subjective:    Patient ID: [name], male    DOB: 04-Apr-1945, 75 y.o.   MRN: [id number]  Chief Complaint  Patient presents with   Abdominal Pain    HPI Patient is in today for LLQ abdominal pain x 1 week. The pain is intermittent. It feels achy. He reports that the pain improves with eating. He denies fever, nausea, vomiting, diarrhea, constipation, urinary symptoms, or heartburn. He was seen in the ED a few weeks ago for this pain. A CT was done that did show a small fat containing hernia in the region that he is reports pian. He has an appointment with a surgeon regarding this on 11/23/20. He has a history of GERD with esophagitis per endoscopy report in 2020. He was previously taking protonix for this but has not been recently. He is unsure of how long it has been since he stopped taking this, however it does not look like refills have been sent in since 12/2019.  Past Medical History:  Diagnosis Date   Aortic atherosclerosis ([place])    2004  s/p  closure Penetrating atherosclerotic ulcer of infarenal aorta w/ endovascular stent graft   Arthritis    CKD (chronic kidney disease), stage III (HCC)    Closed fracture of head of humerus 07/2017   right shoulder from fall; 09-25-2017 per pt no surgical intervention, only wore sling, intermittant pain   Coronary atherosclerosis of native coronary artery    positive myoview for ischemia 09-27-2009;  10-01-2009 per cardiac cath-- minimal nonobstructive CAD w/ 30% LAD    ED (erectile dysfunction)    Emphysema/COPD ([place])    followed by pcp--- last exacerbation 09-12-2017;  09-25-2017 per pt no cough, sob or congestion   Essential hypertension    Family history of kidney cancer    First degree heart block    Frequency of urination    GERD (gastroesophageal reflux disease)    Gout    09-25-2017  per pt stable,  last episode 2015   Heart murmur    History of adenomatous polyp of colon    History of closed head  injury 2005   per pt residual resolved   History of external beam radiation therapy    completed 10/ 2015 for prostate cancer   History of gastric ulcer 2014   History of kidney stones    History of pneumothorax    spontaneous pneumo treated w/ chest tube   History of rib fracture 07/2017   from fall,  right side 6th,7th,8th   Neuropathy    OAB (overactive bladder)    OSA (obstructive sleep apnea)    Intolerant of CPAP   Prostate cancer ([place])    dx 09-26-2011 via bx-- Stage T3b,N0,  Gleason 4+4, PSA 11.2 with METs to external iliac lymph node(resolved with ADT)-- treated w/ ADT ;   05/2013 staging work-up for rising PSA , started casodex and completed radiation therapy 10/ 2015;   rising PSA post treatment   RLS (restless legs syndrome)    Ureteral neocystostomy bleed    left side   Urge urinary incontinence     Past Surgical History:  Procedure Laterality Date   ABDOMINAL AORTIC ENDOVASCULAR STENT GRAFT  09-13-2007    dr [name]  [facility]   closure penetrating atherosclerotic ulcer of infarenal aorta with stent graft   BIOPSY  03/06/2019   Procedure: BIOPSY;  Surgeon: [name], MD;  [facility];  Service: Endoscopy;;  gastric  CARDIAC CATHETERIZATION  2005   per pt normal (done in [place])   [place]  10/06/2009    dr cooper   minimal nonobstructive CAD w/30% LAD otherwise normal coronaries, LVEDP 88mmHg   CARDIOVASCULAR STRESS TEST  09/27/2009    dr Aundra Dubin   lexiscan nuclear study w/ moderate reversible inferior perfusion defect ischemia,  ef 60% (cardiac cath scheduled)   CATARACT EXTRACTION W/PHACO Right 07/12/2020   Procedure: CATARACT EXTRACTION PHACO AND INTRAOCULAR LENS PLACEMENT RIGHT EYE;  Surgeon: [name], MD;  Location: [facility];  Service: Ophthalmology;  Laterality: Right;  CDE=15.48   CHEST TUBE INSERTION  1989   "collapsed lung"due to injury   CIRCUMCISION  09/26/2011   Procedure: CIRCUMCISION ADULT;  Surgeon: [name], MD;   Location: [facility];  Service: Urology;  Laterality: N/A;   COLONOSCOPY W/ POLYPECTOMY     COLONOSCOPY WITH PROPOFOL N/A 04/27/2016    eight 4-8 mm polyps in descending colon, at splenic flexure, in ascending colon and cecum. Tubular adenomas. Surveillance in 3 years.   CYSTOSCOPY  09/26/2011   Procedure: CYSTOSCOPY;  Surgeon: [name], MD;  Location: [facility];  Service: Urology;  Laterality: N/A;   CYSTOSCOPY/RETROGRADE/URETEROSCOPY Left 09/27/2017   Procedure: CYSTOSCOPYLEFT /RETROGRADE/URETEROSCOPY AND RENAL WASHINGS;  Surgeon: [name], MD;  Location[facility];  Service: Urology;  Laterality: Left;   ESOPHAGOGASTRODUODENOSCOPY (EGD) WITH PROPOFOL N/A 04/27/2016   normal esophagus s/p dilation, small hiatal hernia, normal duodenum   ESOPHAGOGASTRODUODENOSCOPY (EGD) WITH PROPOFOL N/A 03/06/2019   erosive reflux esophagitis, s/p dilation, normal duodenum, abnormal gastric mucosa s/p biopsy. Hyperplastic gastric polyp. No H.pylori.    EXTRACORPOREAL SHOCK WAVE LITHOTRIPSY  yrs ago   FRACTURE SURGERY  child   Bilateral lower arms    KNEE ARTHROSCOPY Right 2013   MALONEY DILATION N/A 04/27/2016   Procedure: Venia Minks DILATION;  Surgeon: [name], MD;  [facility];  Service: Endoscopy;  Laterality: N/A;   MALONEY DILATION N/A 03/06/2019   Procedure: Venia Minks DILATION;  Surgeon: [name], MD;  [facility];  Service: Endoscopy;  Laterality: N/A;   PARTIAL KNEE ARTHROPLASTY Right 04/26/2015   Procedure: RIGHT KNEE MEDIAL UNICOMPARTMENTAL ARTHROPLASTY;  Surgeon: Gaynelle Arabian, MD;  [facility];  Service: Orthopedics;  Laterality: Right;   POLYPECTOMY  04/27/2016   Procedure: POLYPECTOMY;  Surgeon: [name], MD;  [facility];  Service: Endoscopy;;  cecal , ascending, descending, and sigmoid polypectomies   PROSTATE BIOPSY  09/26/2011   Procedure: BIOPSY TRANSRECTAL ULTRASONIC PROSTATE (TUBP);  Surgeon: [name], MD;  [facility];  Service: Urology;  Laterality: N/A;      TRANSTHORACIC ECHOCARDIOGRAM  01-07-2013   dr [name]   ef 60-65%,  grade 1 diastolic dysfunction/  mild AR without stenosis/  mild LAE/  mild to moderate calcificed MV annulus without regurg. or stenosis/   UMBILICAL HERNIA REPAIR  yrs ago    Family History  Problem Relation Age of Onset   Stroke Mother    Alzheimer's disease Mother 26       probably due to head injury   Mesothelioma Father 100   Kidney cancer Father    Hyperlipidemia Sister    Heart attack Sister    Kidney cancer Sister        dx in her 49s   Heart disease Brother    Kidney cancer Brother 73       dx in his 52s  Hyperlipidemia Brother    Kidney cancer Brother        dx in his 6s   Healthy Daughter    Healthy Son    Healthy Son    Colon cancer Neg Hx     Social History   Socioeconomic History   Marital status: Married    Spouse name: Not on file   Number of children: 3   Years of education: 12   Highest education level: Some college, no degree  Occupational History   Occupation: Retired    Comment: Hotel manager    Comment: Part Time at [place] Use   Smoking status: Every Day    Packs/day: 0.50    Years: 42.00    Pack years: 21.00    Types: Cigarettes   Smokeless tobacco: Never  Vaping Use   Vaping Use: Never used  Substance and Sexual Activity   Alcohol use: No    Alcohol/week: 0.0 standard drinks   Drug use: No   Sexual activity: Yes    Birth control/protection: None  Other Topics Concern   Not on file  Social History Narrative   Patient is retired from the [employer]. He lives in a one story home with his wife. He moved from [facility] about 9 years ago. They do not have family on the [place] and although he is very outgoing he feels socially isolated. He isn't a member of any groups or churches.    Social Determinants of Health   Financial Resource Strain: High Risk   Difficulty of Paying Living  Expenses: Hard  Food Insecurity: Not on file  Transportation Needs: No Transportation Needs   Lack of Transportation (Medical): No   Lack of Transportation (Non-Medical): No  Physical Activity: Not on file  Stress: Not on file  Social Connections: Not on file  Intimate Partner Violence: Not At Risk   Fear of Current or Ex-Partner: No   Emotionally Abused: No   Physically Abused: No   Sexually Abused: No    Outpatient Medications Prior to Visit  Medication Sig Dispense Refill   abiraterone acetate (ZYTIGA) 250 MG tablet Take 2 tablets (500 mg total) by mouth daily. Take on an empty stomach 1 hour before or 2 hours after a meal 120 tablet 3   allopurinol (ZYLOPRIM) 300 MG tablet Take 1 tablet by mouth once daily 90 tablet 0   amLODipine (NORVASC) 5 MG tablet Take 1 tablet (5 mg total) by mouth daily. 90 tablet 3   carvedilol (COREG) 25 MG tablet Take 1 tablet by mouth twice daily 180 tablet 0   DULoxetine (CYMBALTA) 60 MG capsule Take 1 capsule (60 mg total) by mouth daily. Take 1 capsule by mouth once daily (Needs to be seen before next refill) 90 capsule 3   losartan (COZAAR) 25 MG tablet Take 1 tablet (25 mg total) by mouth daily. 90 tablet 3   pantoprazole (PROTONIX) 40 MG tablet Take 40 mg by mouth 2 (two) times daily.     potassium chloride SA (KLOR-CON) 20 MEQ tablet Take 1 tablet (20 mEq total) by mouth 2 (two) times daily. 60 tablet 3   pregabalin (LYRICA) 25 MG capsule Take 1 capsule by mouth twice daily 60 capsule 3   rosuvastatin (CRESTOR) 20 MG tablet TAKE 1 TABLET BY MOUTH ONCE DAILY -  STOP  PRAVASTATIN -- NEEDS FOLLOW UP APPOINTMENT FOR REFILLS 15 tablet 0   tamsulosin (FLOMAX) 0.4 MG CAPS capsule Take 1 capsule (  0.4 mg total) by mouth daily. 90 capsule 3   traMADol (ULTRAM) 50 MG tablet Take 1 tablet (50 mg total) by mouth daily as needed. 30 tablet 1   mirabegron ER (MYRBETRIQ) 25 MG TB24 tablet Take 1 tablet (25 mg total) by mouth daily. 28 tablet 0   nystatin  (MYCOSTATIN/NYSTOP) powder Apply 1 application topically 3 (three) times daily. (Patient not taking: Reported on 11/01/2020) 15 g 0   No facility-administered medications prior to visit.    Allergies  Allergen Reactions   Carbidopa-Levodopa Other (See Comments)    hallunications   Morphine Sulfate Nausea And Vomiting   Sulfa Antibiotics Nausea And Vomiting   Sulfacetamide Sodium Nausea And Vomiting    Review of Systems As per HPI.     Objective:    Physical Exam Vitals and nursing note reviewed.  Constitutional:      General: He is not in acute distress.    Appearance: He is not ill-appearing, toxic-appearing or diaphoretic.  Cardiovascular:     Rate and Rhythm: Normal rate and regular rhythm.     Heart sounds: Normal heart sounds. No murmur heard. Pulmonary:     Effort: Pulmonary effort is normal. No respiratory distress.     Breath sounds: Normal breath sounds.  Abdominal:     General: Bowel sounds are normal. There is no distension.     Palpations: Abdomen is soft. There is no mass.     Tenderness: There is no abdominal tenderness. There is no guarding or rebound.  Skin:    General: Skin is warm and dry.  Neurological:     Mental Status: He is alert and oriented to person, place, and time.  Psychiatric:        Mood and Affect: Mood normal.        Behavior: Behavior normal.    BP 122/78   Pulse 69   Temp (!) 97.1 F (36.2 C) (Temporal)   Resp 20   Ht 5\' 7"  (1.702 m)   Wt 257 lb (116.6 kg)   SpO2 95%   BMI 40.25 kg/m  Wt Readings from Last 3 Encounters:  11/01/20 257 lb (116.6 kg)  10/27/20 252 lb (114.3 kg)  10/14/20 259 lb (117.5 kg)    Health Maintenance Due  Topic Date Due   Hepatitis C Screening  Never done   TETANUS/TDAP  Never done   Zoster Vaccines- Shingrix (1 of 2) Never done   PNA vac Low Risk Adult (2 of 2 - PPSV23) 12/30/2015   COVID-19 Vaccine (3 - Moderna risk series) 09/02/2019   INFLUENZA VACCINE  11/01/2020    There are no  preventive care reminders to display for this patient.   Lab Results  Component Value Date   TSH 3.59 08/13/2019   Lab Results  Component Value Date   WBC 5.1 10/26/2020   HGB 11.3 (L) 10/26/2020   HCT 35.0 (L) 10/26/2020   MCV 94.6 10/26/2020   PLT 178 10/26/2020   Lab Results  Component Value Date   NA 136 10/26/2020   K 3.5 10/26/2020   CHLORIDE 110 (H) 10/01/2013   CO2 22 10/26/2020   GLUCOSE 122 (H) 10/26/2020   BUN 35 (H) 10/26/2020   CREATININE 2.45 (H) 10/26/2020   BILITOT 0.6 10/26/2020   ALKPHOS 62 10/26/2020   AST 12 (L) 10/26/2020   ALT 14 10/26/2020   PROT 6.6 10/26/2020   ALBUMIN 3.4 (L) 10/26/2020   CALCIUM 8.1 (L) 10/26/2020   ANIONGAP 7 10/26/2020  Lab Results  Component Value Date   CHOL 197 06/16/2019   Lab Results  Component Value Date   HDL 45 06/16/2019   Lab Results  Component Value Date   LDLCALC 124 (H) 06/16/2019   Lab Results  Component Value Date   TRIG 155 (H) 06/16/2019   Lab Results  Component Value Date   CHOLHDL 4.4 06/16/2019   Lab Results  Component Value Date   HGBA1C 5.6 06/16/2019       Assessment & Plan:   Jaeveon was seen today for abdominal pain.  Diagnoses and all orders for this visit:  Acute left lower quadrant pain Benign exam today. Recent CT in ED showed a small fat containing hernia in the LLQ. No diverticulosis or other acute finding noted on CT. Will see surgeon on 8/23 regarding hernia. Discussed hernia vs GERD vs ulcer causing pain. Patient has not been taking protonix for some time. Will restart this today.   Gastroesophageal reflux disease, unspecified whether esophagitis present Endoscopy in 2020 showed GERD with esophagitis. Restart protonix as below. Discussed this could be cause of pain as it has been untreated for some time. Handout given.  -     pantoprazole (PROTONIX) 40 MG tablet; Take 1 tablet (40 mg total) by mouth 2 (two) times daily before a meal.  Return to office for new or  worsening symptoms, or if symptoms persist.   The patient indicates understanding of these issues and agrees with the plan.  Gwenlyn Perking, FNP

## 2020-11-03 ENCOUNTER — Other Ambulatory Visit: Payer: Self-pay | Admitting: Family Medicine

## 2020-11-03 DIAGNOSIS — R7303 Prediabetes: Secondary | ICD-10-CM

## 2020-11-03 DIAGNOSIS — G629 Polyneuropathy, unspecified: Secondary | ICD-10-CM

## 2020-11-04 ENCOUNTER — Encounter: Payer: Self-pay | Admitting: Urology

## 2020-11-04 ENCOUNTER — Ambulatory Visit (INDEPENDENT_AMBULATORY_CARE_PROVIDER_SITE_OTHER): Payer: Medicare Other | Admitting: Urology

## 2020-11-04 ENCOUNTER — Other Ambulatory Visit: Payer: Self-pay

## 2020-11-04 VITALS — BP 124/69 | HR 56

## 2020-11-04 DIAGNOSIS — N3941 Urge incontinence: Secondary | ICD-10-CM | POA: Diagnosis not present

## 2020-11-04 DIAGNOSIS — R351 Nocturia: Secondary | ICD-10-CM

## 2020-11-04 DIAGNOSIS — C61 Malignant neoplasm of prostate: Secondary | ICD-10-CM | POA: Diagnosis not present

## 2020-11-04 DIAGNOSIS — R9721 Rising PSA following treatment for malignant neoplasm of prostate: Secondary | ICD-10-CM | POA: Diagnosis not present

## 2020-11-04 DIAGNOSIS — C775 Secondary and unspecified malignant neoplasm of intrapelvic lymph nodes: Secondary | ICD-10-CM

## 2020-11-04 LAB — URINALYSIS, ROUTINE W REFLEX MICROSCOPIC
Bilirubin, UA: NEGATIVE
Glucose, UA: NEGATIVE
Ketones, UA: NEGATIVE
Leukocytes,UA: NEGATIVE
Nitrite, UA: NEGATIVE
Specific Gravity, UA: 1.02 (ref 1.005–1.030)
Urobilinogen, Ur: 0.2 mg/dL (ref 0.2–1.0)
pH, UA: 6 (ref 5.0–7.5)

## 2020-11-04 LAB — MICROSCOPIC EXAMINATION
Bacteria, UA: NONE SEEN
RBC, Urine: >30 /hpf — AB (ref 0–2)
Renal Epithel, UA: NONE SEEN /hpf
WBC, UA: NONE SEEN /hpf (ref 0–5)

## 2020-11-04 NOTE — Progress Notes (Signed)
Urological Symptom Review  Patient is experiencing the following symptoms: Frequent urination Get up at night to urinate Leakage of urine   Review of Systems  Gastrointestinal (upper)  : Negative for upper GI symptoms  Gastrointestinal (lower) : Negative for lower GI symptoms  Constitutional : Weight loss  Skin: Negative for skin symptoms  Eyes: Negative for eye symptoms  Ear/Nose/Throat : Negative for Ear/Nose/Throat symptoms  Hematologic/Lymphatic: Negative for Hematologic/Lymphatic symptoms  Cardiovascular : Negative for cardiovascular symptoms  Respiratory : Negative for respiratory symptoms  Endocrine: Negative for endocrine symptoms  Musculoskeletal: Back pain  Neurological: Negative for neurological symptoms  Psychologic: Negative for psychiatric symptoms

## 2020-11-04 NOTE — Progress Notes (Signed)
Subjective:  1. Prostate cancer ([place])   2. 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 Rising PSA following treatment for malignant neoplasm of prostate   4. Nocturia   5. Urge incontinence     11/04/20: [name] returns today in f/u.  He remains on Lupron and had been switched to abiraterone earlier this year from [place].  He recently had to hold the abiraterone secondary to diarrhea but was restarted on 10/27/20 by Dr. Alvy Bimler at a lower dose of 2 tabs daily.   He is on tamsulosin.   His PSA was 0.6 and T  <3 on 10/26/20.  The PSA was 0.39 on 09/20/20.  He has a mild anemia with a Hgb of 11.3 and CKD4 with a Cr. Of 2.45.   He has chronic knee and back pain and is to possibly have a knee replacement.  His IPSS is 18 with nocturia and frequency.  He has had no hematuria.  He had a CT AP on 10/07/20 and no mets were seen.   07/29/20: [name] returns today in f/u for his history noted below.  He remains on Lupron and Xtandi.  The Myrbetriq didn't help.  He remains on tamsulosin.  He is due for Lupron today.  He has chronic back pain and has some concerns about memory which can accompany the ADT and particularly with the Xtandi.    04/29/20: [name] returns in f/u for his locally recurrent prostate cancer now on ADT. His PSA was up to 1.3 on ADT with a testosterone of <10 in 12/20 then 3.2 in 4/21  And 4.2 with a T of <3 in 7/21.   He remains on Lupron and was started on Xtandi  by Dr. Delton Coombes.  His PSA has fallen further to 0.3 from  0.81 since his last visit.    He continues to have nocturia q1.5hrs.   He has bilateral ankle edema.  He has UUI and wears pads.  He is back on tamsulosin but didn't benefit from taking Gemtesa.  He had some benefit with Myrbetriq in the past but got constipated with it.  His weight is stable. He has had no bone pain but he has knee pain and has difficulty ambulating.  He was going to see Dr. Maureen Ralphs but apparently has not yet.  His UA has 3-10 RBC's.    A PET prior  to initiation of the firmagon for a rising PSA that increased to 4.0 with a <3 month PSADT showed local uptake on the PET in the prostate with no mets. He had some pulmonary nodules and a CHest was obtained and 3-6 mo f/u imaging was recommended. The CT Chest was done on 08/12/18 and f/u in 6-12 months was recommended. He is doing well without hot flashes or other complaints. He continues to have nocturia q1.5hrs which may be somewhat better since his PCP added tamsulosin. He has some daytime frequency q2-3hrs. He has intermittency and a sensation of incomplete emptying. He has a variable stream. His IPSS is 21.  He has CKD of 2.16 on recent labs.  His UA had 3-10 RBC's on 4/1.   A renal US in 8/20 didn't show any stones.  He has cysts and some atrophy.   He has a history of T3 N1M0 Gleason 8 prostate cancer with metastatic adenopathy in the right iliac chain. He was diagnosed on 09/26/11. He completed radiation therapy in late October 2015. He has been off of the ADT since 3/18  and his PSA has been rising and is up to 4.0 from 1.6 in 9/19 and from 0.81 on 09/24/17 with a normalized testosterone.   He had cystoscopy and ureteroscopy in June 2019 for gross hematuria from the left collecting system. He has no associated signs or symptoms.        ROS:  ROS:  A complete review of systems was performed.  All systems are negative except for pertinent findings as noted.   ROS  Allergies  Allergen Reactions   Carbidopa-Levodopa Other (See Comments)    hallunications   Morphine Sulfate Nausea And Vomiting   Sulfa Antibiotics Nausea And Vomiting   Sulfacetamide Sodium Nausea And Vomiting    Outpatient Encounter Medications as of 11/04/2020  Medication Sig   abiraterone acetate (ZYTIGA) 250 MG tablet Take 2 tablets (500 mg total) by mouth daily. Take on an empty stomach 1 hour before or 2 hours after a meal   allopurinol (ZYLOPRIM) 300 MG tablet Take 1 tablet by mouth once daily   amLODipine (NORVASC)  5 MG tablet Take 1 tablet (5 mg total) by mouth daily.   carvedilol (COREG) 25 MG tablet Take 1 tablet by mouth twice daily   DULoxetine (CYMBALTA) 60 MG capsule Take 1 capsule (60 mg total) by mouth daily. Take 1 capsule by mouth once daily (Needs to be seen before next refill)   losartan (COZAAR) 25 MG tablet Take 1 tablet (25 mg total) by mouth daily.   nystatin (MYCOSTATIN/NYSTOP) powder Apply 1 application topically 3 (three) times daily. (Patient not taking: Reported on 11/01/2020)   pantoprazole (PROTONIX) 40 MG tablet Take 1 tablet (40 mg total) by mouth 2 (two) times daily before a meal.   potassium chloride SA (KLOR-CON) 20 MEQ tablet Take 1 tablet (20 mEq total) by mouth 2 (two) times daily.   pregabalin (LYRICA) 25 MG capsule Take 1 capsule by mouth twice daily   rosuvastatin (CRESTOR) 20 MG tablet TAKE 1 TABLET BY MOUTH ONCE DAILY -  STOP  PRAVASTATIN -- NEEDS FOLLOW UP APPOINTMENT FOR REFILLS   tamsulosin (FLOMAX) 0.4 MG CAPS capsule Take 1 capsule (0.4 mg total) by mouth daily.   traMADol (ULTRAM) 50 MG tablet Take 1 tablet (50 mg total) by mouth daily as needed.   [DISCONTINUED] allopurinol (ZYLOPRIM) 300 MG tablet Take 1 tablet by mouth once daily   [DISCONTINUED] mirabegron ER (MYRBETRIQ) 25 MG TB24 tablet Take 1 tablet (25 mg total) by mouth daily.   No facility-administered encounter medications on file as of 11/04/2020.    Past Medical History:  Diagnosis Date   Aortic atherosclerosis ([place])    2004  s/p  closure Penetrating atherosclerotic ulcer of infarenal aorta w/ endovascular stent graft   Arthritis    CKD (chronic kidney disease), stage III ([place])    Closed fracture of head of humerus 07/2017   right shoulder from fall; 09-25-2017 per pt no surgical intervention, only wore sling, intermittant pain   Coronary atherosclerosis of native coronary artery    positive myoview for ischemia 09-27-2009;  10-01-2009 per cardiac cath-- minimal nonobstructive CAD w/ 30% LAD    ED  (erectile dysfunction)    Emphysema/COPD ([place])    followed by pcp--- last exacerbation 09-12-2017;  09-25-2017 per pt no cough, sob or congestion   Essential hypertension    Family history of kidney cancer    First degree heart block    Frequency of urination    GERD (gastroesophageal reflux disease)    Gout  09-25-2017  per pt stable,  last episode 2015   Heart murmur    History of adenomatous polyp of colon    History of closed head injury 2005   per pt residual resolved   History of external beam radiation therapy    completed 10/ 2015 for prostate cancer   History of gastric ulcer 2014   History of kidney stones    History of pneumothorax    spontaneous pneumo treated w/ chest tube   History of rib fracture 07/2017   from fall,  right side 6th,7th,8th   Neuropathy    OAB (overactive bladder)    OSA (obstructive sleep apnea)    Intolerant of CPAP   Prostate cancer ([place])    dx 09-26-2011 via bx-- Stage T3b,N0,  Gleason 4+4, PSA 11.2 with METs to external iliac lymph node(resolved with ADT)-- treated w/ ADT ;   05/2013 staging work-up for rising PSA , started casodex and completed radiation therapy 10/ 2015;   rising PSA post treatment   RLS (restless legs syndrome)    Ureteral neocystostomy bleed    left side   Urge urinary incontinence     Past Surgical History:  Procedure Laterality Date   ABDOMINAL AORTIC ENDOVASCULAR STENT GRAFT  09-13-2007    dr [name]  [facility]   closure penetrating atherosclerotic ulcer of infarenal aorta with stent graft   BIOPSY  03/06/2019   Procedure: BIOPSY;  Surgeon: [name], MD;  [facility];  Service: Endoscopy;;  gastric   CARDIAC CATHETERIZATION  2005   per pt normal (done in [place])   [place]  10/06/2009    dr cooper   minimal nonobstructive CAD w/30% LAD otherwise normal coronaries, LVEDP 29mmHg   CARDIOVASCULAR STRESS TEST  09/27/2009    dr Aundra Dubin   lexiscan nuclear study w/ moderate reversible  inferior perfusion defect ischemia,  ef 60% (cardiac cath scheduled)   CATARACT EXTRACTION W/PHACO Right 07/12/2020   Procedure: CATARACT EXTRACTION PHACO AND INTRAOCULAR LENS PLACEMENT RIGHT EYE;  Surgeon: [name], MD;  Location: [facility] OR[facility];  Service: Ophthalmology;  Laterality: Right;  CDE=15.48   CHEST TUBE INSERTION  1989   "collapsed lung"due to injury   CIRCUMCISION  09/26/2011   Procedure: CIRCUMCISION ADULT;  Surgeon: [name], MD;  Location: [facility];  Service: Urology;  Laterality: N/A;   COLONOSCOPY W/ POLYPECTOMY     COLONOSCOPY WITH PROPOFOL N/A 04/27/2016    eight 4-8 mm polyps in descending colon, at splenic flexure, in ascending colon and cecum. Tubular adenomas. Surveillance in 3 years.   CYSTOSCOPY  09/26/2011   Procedure: CYSTOSCOPY;  Surgeon: [name], MD;  Location[facility];  Service: Urology;  Laterality: N/A;   CYSTOSCOPY/RETROGRADE/URETEROSCOPY Left 09/27/2017   Procedure: CYSTOSCOPYLEFT /RETROGRADE/URETEROSCOPY AND RENAL WASHINGS;  Surgeon: [name], MD;  Location: [facility];  Service: Urology;  Laterality: Left;   ESOPHAGOGASTRODUODENOSCOPY (EGD) WITH PROPOFOL N/A 04/27/2016   normal esophagus s/p dilation, small hiatal hernia, normal duodenum   ESOPHAGOGASTRODUODENOSCOPY (EGD) WITH PROPOFOL N/A 03/06/2019   erosive reflux esophagitis, s/p dilation, normal duodenum, abnormal gastric mucosa s/p biopsy. Hyperplastic gastric polyp. No H.pylori.    EXTRACORPOREAL SHOCK WAVE LITHOTRIPSY  yrs ago   FRACTURE SURGERY  child   Bilateral lower arms    KNEE ARTHROSCOPY Right 2013   MALONEY DILATION N/A 04/27/2016   Procedure: Venia Minks DILATION;  Surgeon: [name], MD;  [facility];  Service: Endoscopy;  Laterality: N/A;  MALONEY DILATION N/A 03/06/2019   Procedure: Venia Minks DILATION;  Surgeon: [name], MD;  [facility];  Service: Endoscopy;  Laterality: N/A;   PARTIAL KNEE ARTHROPLASTY Right 04/26/2015   Procedure: RIGHT  KNEE MEDIAL UNICOMPARTMENTAL ARTHROPLASTY;  Surgeon: Gaynelle Arabian, MD;  [facility];  Service: Orthopedics;  Laterality: Right;   POLYPECTOMY  04/27/2016   Procedure: POLYPECTOMY;  Surgeon: [name], MD;  [facility];  Service: Endoscopy;;  cecal , ascending, descending, and sigmoid polypectomies   PROSTATE BIOPSY  09/26/2011   Procedure: BIOPSY TRANSRECTAL ULTRASONIC PROSTATE (TUBP);  Surgeon: [name], MD;  [facility];  Service: Urology;  Laterality: N/A;      TRANSTHORACIC ECHOCARDIOGRAM  01-07-2013   dr [name]   ef 60-65%,  grade 1 diastolic dysfunction/  mild AR without stenosis/  mild LAE/  mild to moderate calcificed MV annulus without regurg. or stenosis/   UMBILICAL HERNIA REPAIR  yrs ago    Social History   Socioeconomic History   Marital status: Married    Spouse name: Not on file   Number of children: 3   Years of education: 12   Highest education level: Some college, no degree  Occupational History   Occupation: Retired    Comment: Hotel manager    Comment: Part Time at [employer] Use   Smoking status: Every Day    Packs/day: 0.50    Years: 42.00    Pack years: 21.00    Types: Cigarettes   Smokeless tobacco: Never  Vaping Use   Vaping Use: Never used  Substance and Sexual Activity   Alcohol use: No    Alcohol/week: 0.0 standard drinks   Drug use: No   Sexual activity: Yes    Birth control/protection: None  Other Topics Concern   Not on file  Social History Narrative   Patient is retired from the [employer]. He lives in a one story home with his wife. He moved from [facility] about 9 years ago. They do not have family on the [place] and although he is very outgoing he feels socially isolated. He isn't a member of any groups or churches.    Social Determinants of Health   Financial Resource Strain: High Risk   Difficulty of Paying Living Expenses: Hard  Food Insecurity: Not on file  Transportation  Needs: No Transportation Needs   Lack of Transportation (Medical): No   Lack of Transportation (Non-Medical): No  Physical Activity: Not on file  Stress: Not on file  Social Connections: Not on file  Intimate Partner Violence: Not At Risk   Fear of Current or Ex-Partner: No   Emotionally Abused: No   Physically Abused: No   Sexually Abused: No    Family History  Problem Relation Age of Onset   Stroke Mother    Alzheimer's disease Mother 75       probably due to head injury   Mesothelioma Father 25   Kidney cancer Father    Hyperlipidemia Sister    Heart attack Sister    Kidney cancer Sister        dx in her 40s   Heart disease Brother    Kidney cancer Brother 77       dx in his 32s   Hyperlipidemia Brother    Kidney cancer Brother        dx in his 20s   Healthy Daughter    Healthy Son  Healthy Son    Colon cancer Neg Hx        Objective: Vitals:   11/04/20 1311  BP: 124/69  Pulse: (!) 56     Physical Exam  Lab Results:  Recent Results (from the past 2160 hour(s))  CBC with Differential     Status: Abnormal   Collection Time: 08/16/20 11:51 AM  Result Value Ref Range   WBC 6.1 4.0 - 10.5 K/uL   RBC 3.62 (L) 4.22 - 5.81 MIL/uL   Hemoglobin 11.1 (L) 13.0 - 17.0 g/dL   HCT 34.0 (L) 39.0 - 52.0 %   MCV 93.9 80.0 - 100.0 fL   MCH 30.7 26.0 - 34.0 pg   MCHC 32.6 30.0 - 36.0 g/dL   RDW 15.1 11.5 - 15.5 %   Platelets 169 150 - 400 K/uL   nRBC 0.0 0.0 - 0.2 %   Neutrophils Relative % 77 %   Neutro Abs 4.7 1.7 - 7.7 K/uL   Lymphocytes Relative 11 %   Lymphs Abs 0.7 0.7 - 4.0 K/uL   Monocytes Relative 6 %   Monocytes Absolute 0.4 0.1 - 1.0 K/uL   Eosinophils Relative 5 %   Eosinophils Absolute 0.3 0.0 - 0.5 K/uL   Basophils Relative 1 %   Basophils Absolute 0.0 0.0 - 0.1 K/uL   Immature Granulocytes 0 %   Abs Immature Granulocytes 0.02 0.00 - 0.07 K/uL    Comment: Performed at [facility], [address] [place], [place] [address]  Comprehensive  metabolic panel     Status: Abnormal   Collection Time: 08/16/20 11:51 AM  Result Value Ref Range   Sodium 137 135 - 145 mmol/L   Potassium 3.5 3.5 - 5.1 mmol/L   Chloride 106 98 - 111 mmol/L   CO2 24 22 - 32 mmol/L   Glucose, Bld 108 (H) 70 - 99 mg/dL    Comment: Glucose reference range applies only to samples taken after fasting for at least 8 hours.   BUN 20 8 - 23 mg/dL   Creatinine, Ser 2.15 (H) 0.61 - 1.24 mg/dL   Calcium 8.2 (L) 8.9 - 10.3 mg/dL   Total Protein 6.5 6.5 - 8.1 g/dL   Albumin 3.2 (L) 3.5 - 5.0 g/dL   AST 10 (L) 15 - 41 U/L   ALT 9 0 - 44 U/L   Alkaline Phosphatase 55 38 - 126 U/L   Total Bilirubin 0.7 0.3 - 1.2 mg/dL   GFR, Estimated 31 (L) >60 mL/min    Comment: (NOTE) Calculated using the CKD-EPI Creatinine Equation (2021)    Anion gap 7 5 - 15    Comment: Performed at [facility], [address], [place], [place] [address]  PSA     Status: None   Collection Time: 08/16/20 11:51 AM  Result Value Ref Range   Prostatic Specific Antigen 0.44 0.00 - 4.00 ng/mL    Comment: (NOTE) While PSA levels of <=4.0 ng/ml are reported as reference range, some men with levels below 4.0 ng/ml can have prostate cancer and many men with PSA above 4.0 ng/ml do not have prostate cancer.  Other tests such as free PSA, age specific reference ranges, PSA velocity and PSA doubling time may be helpful especially in men less than 43 years old. Performed at [facility], [place] [address], [place], [place] [address]   Testosterone     Status: Abnormal   Collection Time: 08/16/20 11:51 AM  Result Value Ref Range  Testosterone 10 (L) 264 - 916 ng/dL    Comment: (NOTE) Adult male reference interval is based on a population of healthy nonobese males (BMI <30) between 63 and 105 years old. [place], [place] [phone]. PMID: [id number]. Performed At: [facility] [place], [place] [id number] Rush Farmer MD [id number]   PSA     Status:  None   Collection Time: 09/20/20  1:33 PM  Result Value Ref Range   Prostatic Specific Antigen 0.39 0.00 - 4.00 ng/mL    Comment: (NOTE) While PSA levels of <=4.0 ng/ml are reported as reference range, some men with levels below 4.0 ng/ml can have prostate cancer and many men with PSA above 4.0 ng/ml do not have prostate cancer.  Other tests such as free PSA, age specific reference ranges, PSA velocity and PSA doubling time may be helpful especially in men less than 9 years old. Performed at [facility], [place] [address], [place], [place] [address]   Comprehensive metabolic panel     Status: Abnormal   Collection Time: 09/20/20  1:34 PM  Result Value Ref Range   Sodium 137 135 - 145 mmol/L   Potassium 2.6 (LL) 3.5 - 5.1 mmol/L    Comment: CRITICAL RESULT CALLED TO, READ BACK BY AND VERIFIED WITH: EDWARDS,M ON 09/20/20 AT 1445 BY LOY,C    Chloride 104 98 - 111 mmol/L   CO2 23 22 - 32 mmol/L   Glucose, Bld 103 (H) 70 - 99 mg/dL    Comment: Glucose reference range applies only to samples taken after fasting for at least 8 hours.   BUN 23 8 - 23 mg/dL   Creatinine, Ser 2.34 (H) 0.61 - 1.24 mg/dL   Calcium 7.9 (L) 8.9 - 10.3 mg/dL   Total Protein 6.8 6.5 - 8.1 g/dL   Albumin 3.5 3.5 - 5.0 g/dL   AST 12 (L) 15 - 41 U/L   ALT 9 0 - 44 U/L   Alkaline Phosphatase 68 38 - 126 U/L   Total Bilirubin 0.8 0.3 - 1.2 mg/dL   GFR, Estimated 28 (L) >60 mL/min    Comment: (NOTE) Calculated using the CKD-EPI Creatinine Equation (2021)    Anion gap 10 5 - 15    Comment: Performed at [facility], [address], [place], [place] [address]  CBC with Differential/Platelet     Status: Abnormal   Collection Time: 09/20/20  1:34 PM  Result Value Ref Range   WBC 7.2 4.0 - 10.5 K/uL   RBC 3.74 (L) 4.22 - 5.81 MIL/uL   Hemoglobin 11.6 (L) 13.0 - 17.0 g/dL   HCT 35.3 (L) 39.0 - 52.0 %   MCV 94.4 80.0 - 100.0 fL   MCH 31.0 26.0 - 34.0 pg   MCHC 32.9 30.0 - 36.0 g/dL   RDW 14.6 11.5 - 15.5 %    Platelets 169 150 - 400 K/uL   nRBC 0.0 0.0 - 0.2 %   Neutrophils Relative % 79 %   Neutro Abs 5.7 1.7 - 7.7 K/uL   Lymphocytes Relative 10 %   Lymphs Abs 0.7 0.7 - 4.0 K/uL   Monocytes Relative 6 %   Monocytes Absolute 0.5 0.1 - 1.0 K/uL   Eosinophils Relative 5 %   Eosinophils Absolute 0.4 0.0 - 0.5 K/uL   Basophils Relative 0 %   Basophils Absolute 0.0 0.0 - 0.1 K/uL   Immature Granulocytes 0 %   Abs Immature Granulocytes 0.02 0.00 - 0.07 K/uL  Comment: Performed at [facility], [address], [place], [place] [address]  Testosterone     Status: Abnormal   Collection Time: 09/20/20  1:34 PM  Result Value Ref Range   Testosterone <3 (L) 264 - 916 ng/dL    Comment: (NOTE) Adult male reference interval is based on a population of healthy nonobese males (BMI <30) between 42 and 59 years old. [place], [place] [phone]. PMID: [id number]. Performed At: [facility] [address] [place], [place] [id number] Rush Farmer MD [id number]   Methylmalonic acid, serum     Status: Abnormal   Collection Time: 09/24/20 12:46 PM  Result Value Ref Range   Methylmalonic Acid, Quantitative 507 (H) 0 - 378 nmol/L    Comment: (NOTE) This test was developed and its performance characteristics determined by [facility]. It has not been cleared or approved by the Food and Drug Administration. Performed At: [facility] [place], [place] [id number] Rush Farmer MD [id number]   Comprehensive metabolic panel     Status: Abnormal   Collection Time: 09/24/20 12:46 PM  Result Value Ref Range   Sodium 135 135 - 145 mmol/L   Potassium 3.3 (L) 3.5 - 5.1 mmol/L   Chloride 107 98 - 111 mmol/L   CO2 22 22 - 32 mmol/L   Glucose, Bld 119 (H) 70 - 99 mg/dL    Comment: Glucose reference range applies only to samples taken after fasting for at least 8 hours.   BUN 23 8 - 23 mg/dL   Creatinine, Ser 2.13 (H) 0.61 - 1.24 mg/dL   Calcium 8.3 (L) 8.9 - 10.3  mg/dL   Total Protein 6.4 (L) 6.5 - 8.1 g/dL   Albumin 3.3 (L) 3.5 - 5.0 g/dL   AST 13 (L) 15 - 41 U/L   ALT 10 0 - 44 U/L   Alkaline Phosphatase 65 38 - 126 U/L   Total Bilirubin 0.7 0.3 - 1.2 mg/dL   GFR, Estimated 32 (L) >60 mL/min    Comment: (NOTE) Calculated using the CKD-EPI Creatinine Equation (2021)    Anion gap 6 5 - 15    Comment: Performed at [facility], [address] [place], [place] [address]  Metanephrines, plasma     Status: None   Collection Time: 09/24/20 12:46 PM  Result Value Ref Range   Normetanephrine, Free 274.6 0.0 - 285.2 pg/mL    Comment: (NOTE) This test was developed and its performance characteristics determined by [facility]. It has not been cleared or approved by the Food and Drug Administration.    Metanephrine, Free 23.8 0.0 - 88.0 pg/mL    Comment: (NOTE) This test was developed and its performance characteristics determined by [facility]. It has not been cleared or approved by the Food and Drug Administration. Performed At: [facility] [place], [place] [id number] Rush Farmer MD [id number]   Urinalysis, Routine w reflex microscopic Urine, Clean Catch     Status: Abnormal   Collection Time: 10/07/20  7:50 PM  Result Value Ref Range   Color, Urine YELLOW YELLOW   APPearance CLEAR CLEAR   Specific Gravity, Urine 1.010 1.005 - 1.030   pH 6.0 5.0 - 8.0   Glucose, UA NEGATIVE NEGATIVE mg/dL   Hgb urine dipstick SMALL (A) NEGATIVE   Bilirubin Urine NEGATIVE NEGATIVE   Ketones, ur NEGATIVE NEGATIVE mg/dL   Protein, ur 100 (A) NEGATIVE mg/dL   Nitrite NEGATIVE NEGATIVE   Leukocytes,Ua NEGATIVE NEGATIVE   RBC / HPF 6-10 0 -  5 RBC/hpf   WBC, UA 0-5 0 - 5 WBC/hpf   Bacteria, UA NONE SEEN NONE SEEN   Squamous Epithelial / LPF 0-5 0 - 5    Comment: Performed at [facility], [address], [place], [place] [address]  Lipase, blood     Status: None   Collection Time: 10/07/20  8:01 PM  Result Value Ref Range   Lipase  45 11 - 51 U/L    Comment: Performed at [facility], [address], [place], [place] [address]  Comprehensive metabolic panel     Status: Abnormal   Collection Time: 10/07/20  8:01 PM  Result Value Ref Range   Sodium 138 135 - 145 mmol/L   Potassium 3.0 (L) 3.5 - 5.1 mmol/L   Chloride 108 98 - 111 mmol/L   CO2 23 22 - 32 mmol/L   Glucose, Bld 88 70 - 99 mg/dL    Comment: Glucose reference range applies only to samples taken after fasting for at least 8 hours.   BUN 15 8 - 23 mg/dL   Creatinine, Ser 1.83 (H) 0.61 - 1.24 mg/dL   Calcium 8.0 (L) 8.9 - 10.3 mg/dL   Total Protein 6.7 6.5 - 8.1 g/dL   Albumin 3.5 3.5 - 5.0 g/dL   AST 16 15 - 41 U/L   ALT 11 0 - 44 U/L   Alkaline Phosphatase 71 38 - 126 U/L   Total Bilirubin 0.3 0.3 - 1.2 mg/dL   GFR, Estimated 38 (L) >60 mL/min    Comment: (NOTE) Calculated using the CKD-EPI Creatinine Equation (2021)    Anion gap 7 5 - 15    Comment: Performed at [facility], [address] [place], [place] [address]  CBC     Status: Abnormal   Collection Time: 10/07/20  8:01 PM  Result Value Ref Range   WBC 6.5 4.0 - 10.5 K/uL   RBC 3.81 (L) 4.22 - 5.81 MIL/uL   Hemoglobin 11.9 (L) 13.0 - 17.0 g/dL   HCT 35.5 (L) 39.0 - 52.0 %   MCV 93.2 80.0 - 100.0 fL   MCH 31.2 26.0 - 34.0 pg   MCHC 33.5 30.0 - 36.0 g/dL   RDW 14.6 11.5 - 15.5 %   Platelets 228 150 - 400 K/uL   nRBC 0.0 0.0 - 0.2 %    Comment: Performed at [facility], [address], [place],  [address]  CBC with Differential     Status: Abnormal   Collection Time: 10/26/20 10:14 AM  Result Value Ref Range   WBC 5.1 4.0 - 10.5 K/uL   RBC 3.70 (L) 4.22 - 5.81 MIL/uL   Hemoglobin 11.3 (L) 13.0 - 17.0 g/dL   HCT 35.0 (L) 39.0 - 52.0 %   MCV 94.6 80.0 - 100.0 fL   MCH 30.5 26.0 - 34.0 pg   MCHC 32.3 30.0 - 36.0 g/dL   RDW 14.6 11.5 - 15.5 %   Platelets 178 150 - 400 K/uL   nRBC 0.0 0.0 - 0.2 %   Neutrophils Relative % 74 %   Neutro Abs 3.8 1.7 - 7.7 K/uL   Lymphocytes  Relative 12 %   Lymphs Abs 0.6 (L) 0.7 - 4.0 K/uL   Monocytes Relative 6 %   Monocytes Absolute 0.3 0.1 - 1.0 K/uL   Eosinophils Relative 7 %   Eosinophils Absolute 0.3 0.0 - 0.5 K/uL   Basophils Relative 1 %   Basophils Absolute 0.0 0.0 - 0.1 K/uL  Immature Granulocytes 0 %   Abs Immature Granulocytes 0.02 0.00 - 0.07 K/uL    Comment: Performed at [facility], [address], [place], [place] [address]  Comprehensive metabolic panel     Status: Abnormal   Collection Time: 10/26/20 10:14 AM  Result Value Ref Range   Sodium 136 135 - 145 mmol/L   Potassium 3.5 3.5 - 5.1 mmol/L   Chloride 107 98 - 111 mmol/L   CO2 22 22 - 32 mmol/L   Glucose, Bld 122 (H) 70 - 99 mg/dL    Comment: Glucose reference range applies only to samples taken after fasting for at least 8 hours.   BUN 35 (H) 8 - 23 mg/dL   Creatinine, Ser 2.45 (H) 0.61 - 1.24 mg/dL   Calcium 8.1 (L) 8.9 - 10.3 mg/dL   Total Protein 6.6 6.5 - 8.1 g/dL   Albumin 3.4 (L) 3.5 - 5.0 g/dL   AST 12 (L) 15 - 41 U/L   ALT 14 0 - 44 U/L   Alkaline Phosphatase 62 38 - 126 U/L   Total Bilirubin 0.6 0.3 - 1.2 mg/dL   GFR, Estimated 27 (L) >60 mL/min    Comment: (NOTE) Calculated using the CKD-EPI Creatinine Equation (2021)    Anion gap 7 5 - 15    Comment: Performed at [facility], [address], [place], [place] [address]  PSA     Status: None   Collection Time: 10/26/20 10:14 AM  Result Value Ref Range   Prostatic Specific Antigen 0.60 0.00 - 4.00 ng/mL    Comment: (NOTE) While PSA levels of <=4.0 ng/ml are reported as reference range, some men with levels below 4.0 ng/ml can have prostate cancer and many men with PSA above 4.0 ng/ml do not have prostate cancer.  Other tests such as free PSA, age specific reference ranges, PSA velocity and PSA doubling time may be helpful especially in men less than 89 years old. Performed at [facility], [place] [address] [place], [place] [address]   Testosterone     Status: Abnormal    Collection Time: 10/26/20 10:14 AM  Result Value Ref Range   Testosterone <3 (L) 264 - 916 ng/dL    Comment: (NOTE) Adult male reference interval is based on a population of healthy nonobese males (BMI <30) between 22 and 25 years old. [place], [place] [phone]. PMID: [id number]. Performed At: [facility] [address] [place], [place] [id number] Rush Farmer MD DX:[id number]   10/26/20 PSA 0.6 04/14/20 PSA 0.30. 01/19/20 PSA 0.81  Lab Results  Component Value Date   PSA1 4.2 (H) 11/14/2019   Lab Results  Component Value Date   PSA 3.2 07/18/2019   PSA 0.37 10/01/2013   PSA 0.27 07/09/2013    Studies/Results:  CT [facility] report from 10/07/20 reviewed.  Office note from Dr. Alvy Bimler reviewed.   Assessment & Plan: [place] Resistant metastatic prostate cancer with a good PSA response to Abiraterone but with a slight increase in the PSA after holding the med.  CT onm 10/07/20 was negative for mets.  Lupron 45mg  given at last visit and will be due in 3 months.    He will return in 3 months.  Nocturia and Urge incontinence.  Continue tamsulosin.  He has failed myrbetriq and gemtesa.  Renal stones.  He had 2 20mm renal stones on CT.            No orders of the defined types were placed in this encounter.  Orders Placed This Encounter  Procedures   Urinalysis, Routine w reflex microscopic      Return for for Lupron. .   CC: [name], [name], MD      [name] 11/04/2020 Patient ID: [name], male   DOB: September 10, 1945, 75 y.o.   MRN: [id number]

## 2020-11-10 ENCOUNTER — Inpatient Hospital Stay (HOSPITAL_COMMUNITY): Payer: Medicare Other

## 2020-11-10 ENCOUNTER — Inpatient Hospital Stay (HOSPITAL_COMMUNITY): Payer: Medicare Other | Attending: Hematology | Admitting: Oncology

## 2020-11-10 ENCOUNTER — Other Ambulatory Visit: Payer: Self-pay

## 2020-11-10 VITALS — BP 123/68 | HR 58 | Temp 96.8°F | Resp 18 | Wt 253.0 lb

## 2020-11-10 DIAGNOSIS — Z79899 Other long term (current) drug therapy: Secondary | ICD-10-CM | POA: Diagnosis not present

## 2020-11-10 DIAGNOSIS — N184 Chronic kidney disease, stage 4 (severe): Secondary | ICD-10-CM | POA: Diagnosis not present

## 2020-11-10 DIAGNOSIS — C61 Malignant neoplasm of prostate: Secondary | ICD-10-CM

## 2020-11-10 DIAGNOSIS — I129 Hypertensive chronic kidney disease with stage 1 through stage 4 chronic kidney disease, or unspecified chronic kidney disease: Secondary | ICD-10-CM | POA: Insufficient documentation

## 2020-11-10 DIAGNOSIS — F17210 Nicotine dependence, cigarettes, uncomplicated: Secondary | ICD-10-CM | POA: Insufficient documentation

## 2020-11-10 DIAGNOSIS — M25561 Pain in right knee: Secondary | ICD-10-CM | POA: Insufficient documentation

## 2020-11-10 DIAGNOSIS — Z923 Personal history of irradiation: Secondary | ICD-10-CM | POA: Diagnosis not present

## 2020-11-10 DIAGNOSIS — N183 Chronic kidney disease, stage 3 unspecified: Secondary | ICD-10-CM | POA: Insufficient documentation

## 2020-11-10 LAB — COMPREHENSIVE METABOLIC PANEL
ALT: 10 U/L (ref 0–44)
AST: 13 U/L — ABNORMAL LOW (ref 15–41)
Albumin: 3.3 g/dL — ABNORMAL LOW (ref 3.5–5.0)
Alkaline Phosphatase: 69 U/L (ref 38–126)
Anion gap: 6 (ref 5–15)
BUN: 22 mg/dL (ref 8–23)
CO2: 20 mmol/L — ABNORMAL LOW (ref 22–32)
Calcium: 8 mg/dL — ABNORMAL LOW (ref 8.9–10.3)
Chloride: 109 mmol/L (ref 98–111)
Creatinine, Ser: 2.82 mg/dL — ABNORMAL HIGH (ref 0.61–1.24)
GFR, Estimated: 23 mL/min — ABNORMAL LOW (ref >60)
Glucose, Bld: 122 mg/dL — ABNORMAL HIGH (ref 70–99)
Potassium: 3.2 mmol/L — ABNORMAL LOW (ref 3.5–5.1)
Sodium: 135 mmol/L (ref 135–145)
Total Bilirubin: 0.6 mg/dL (ref 0.3–1.2)
Total Protein: 6.3 g/dL — ABNORMAL LOW (ref 6.5–8.1)

## 2020-11-10 LAB — CBC WITH DIFFERENTIAL/PLATELET
Abs Immature Granulocytes: 0.02 10*3/uL (ref 0.00–0.07)
Basophils Absolute: 0 10*3/uL (ref 0.0–0.1)
Basophils Relative: 1 %
Eosinophils Absolute: 0.3 10*3/uL (ref 0.0–0.5)
Eosinophils Relative: 8 %
HCT: 32 % — ABNORMAL LOW (ref 39.0–52.0)
Hemoglobin: 10.4 g/dL — ABNORMAL LOW (ref 13.0–17.0)
Immature Granulocytes: 1 %
Lymphocytes Relative: 17 %
Lymphs Abs: 0.7 10*3/uL (ref 0.7–4.0)
MCH: 30.6 pg (ref 26.0–34.0)
MCHC: 32.5 g/dL (ref 30.0–36.0)
MCV: 94.1 fL (ref 80.0–100.0)
Monocytes Absolute: 0.2 10*3/uL (ref 0.1–1.0)
Monocytes Relative: 5 %
Neutro Abs: 2.8 10*3/uL (ref 1.7–7.7)
Neutrophils Relative %: 68 %
Platelets: 184 10*3/uL (ref 150–400)
RBC: 3.4 MIL/uL — ABNORMAL LOW (ref 4.22–5.81)
RDW: 14.2 % (ref 11.5–15.5)
WBC: 4.1 10*3/uL (ref 4.0–10.5)
nRBC: 0 % (ref 0.0–0.2)

## 2020-11-10 LAB — PSA: Prostatic Specific Antigen: 0.57 ng/mL (ref 0.00–4.00)

## 2020-11-10 NOTE — Progress Notes (Addendum)
[facility] progress notes  Patient Care Team: [name], [name], MD as PCP - General (Family Medicine) Satira Sark, MD as PCP - Cardiology (Cardiology) Satira Sark, MD as Consulting Physician (Cardiology) Ilean China, RN as Registered Nurse Derek Jack, MD as Medical Oncologist (Medical Oncology)  CHIEF COMPLAINTS/PURPOSE OF VISIT:  Prostate cancer, on Zytiga  I connected with [name] on 11/11/20 at 10:30 AM EDT by video enabled telemedicine visit and verified that I am speaking with the correct person using two identifiers.   I discussed the limitations, risks, security and privacy concerns of performing an evaluation and management service by telemedicine and the availability of in-person appointments. I also discussed with the patient that there may be a patient responsible charge related to this service. The patient expressed understanding and agreed to proceed.   Other persons participating in the visit and their role in the encounter: RN, NP and patient    Patient's location: [facility]  Provider's location: [facility]   HISTORY OF PRESENTING ILLNESS:  [name] is a 75 year old male who presents to discuss lab work and tolerance of Zytiga.  He was seen last by Dr. Alvy Bimler on 10/27/2020.  He reported poor tolerance of Zytiga with diarrhea and abdominal pain.  He was seen in the emergency room and work-up included CT imaging which showed hernia.  Diarrhea stopped once he is discontinued Zytiga.  He was instructed to increase his fluid intake, hold Zytiga for a few days and resume prior to his next appointment.  Interval history: He presents back today to review his lab work and to assess tolerance of Zytiga.  He reports doing better since his last visit.  States he cannot drink 80 to 90 ounces of water per day but is drinking as much as he can.  Abdominal pain and diarrhea improved once he stopped Zytiga and has not returned.  He  restarted Zytiga 500 mg on 11/01/2020 as instructed.    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   Arthritis    CKD (chronic kidney disease), stage III (HCC)    Closed fracture of head of humerus 07/2017   right shoulder from fall; 09-25-2017 per pt no surgical intervention, only wore sling, intermittant pain   Coronary atherosclerosis of native coronary artery    positive myoview for ischemia 09-27-2009;  10-01-2009 per cardiac cath-- minimal nonobstructive CAD w/ 30% LAD    ED (erectile dysfunction)    Emphysema/COPD ([place])    followed by pcp--- last exacerbation 09-12-2017;  09-25-2017 per pt no cough, sob or congestion   Essential hypertension    Family history of kidney cancer    First degree heart block    Frequency of urination    GERD (gastroesophageal reflux disease)    Gout    09-25-2017  per pt stable,  last episode 2015   Heart murmur    History of adenomatous polyp of colon    History of closed head injury 2005   per pt residual resolved   History of external beam radiation therapy    completed 10/ 2015 for prostate cancer   History of gastric ulcer 2014   History of kidney stones    History of pneumothorax    spontaneous pneumo treated w/ chest tube   History of rib fracture 07/2017   from  fall,  right side 6th,7th,8th   Neuropathy    OAB (overactive bladder)    OSA (obstructive sleep apnea)    Intolerant of CPAP   Prostate cancer ([place])    dx 09-26-2011 via bx-- Stage T3b,N0,  Gleason 4+4, PSA 11.2 with METs to external iliac lymph node(resolved with ADT)-- treated w/ ADT ;   05/2013 staging work-up for rising PSA , started casodex and completed radiation therapy 10/ 2015;   rising PSA post treatment   RLS (restless legs syndrome)    Ureteral neocystostomy bleed    left side   Urge urinary incontinence      SURGICAL HISTORY: Past Surgical History:  Procedure Laterality Date   ABDOMINAL AORTIC ENDOVASCULAR STENT GRAFT  09-13-2007    dr [name]  [facility]   closure penetrating atherosclerotic ulcer of infarenal aorta with stent graft   BIOPSY  03/06/2019   Procedure: BIOPSY;  Surgeon: [name], MD;  Location: [facility] ENDO SUITE;  Service: Endoscopy;;  gastric   CARDIAC CATHETERIZATION  2005   per pt normal (done in [place])   [place]  10/06/2009    dr cooper   minimal nonobstructive CAD w/30% LAD otherwise normal coronaries, LVEDP 107mmHg   CARDIOVASCULAR STRESS TEST  09/27/2009    dr Aundra Dubin   lexiscan nuclear study w/ moderate reversible inferior perfusion defect ischemia,  ef 60% (cardiac cath scheduled)   CATARACT EXTRACTION W/PHACO Right 07/12/2020   Procedure: CATARACT EXTRACTION PHACO AND INTRAOCULAR LENS PLACEMENT RIGHT EYE;  Surgeon: [name], MD;  Location: [facility] ORS;  Service: Ophthalmology;  Laterality: Right;  CDE=15.48   CHEST TUBE INSERTION  1989   "collapsed lung"due to injury   CIRCUMCISION  09/26/2011   Procedure: CIRCUMCISION ADULT;  Surgeon: [name], MD;  Location: [facility] ORS;  Service: Urology;  Laterality: N/A;   COLONOSCOPY W/ POLYPECTOMY     COLONOSCOPY WITH PROPOFOL N/A 04/27/2016    eight 4-8 mm polyps in descending colon, at splenic flexure, in ascending colon and cecum. Tubular adenomas. Surveillance in 3 years.   CYSTOSCOPY  09/26/2011   Procedure: CYSTOSCOPY;  Surgeon: [name], MD;  Location: [facility] ORS;  Service: Urology;  Laterality: N/A;   CYSTOSCOPY/RETROGRADE/URETEROSCOPY Left 09/27/2017   Procedure: CYSTOSCOPYLEFT /RETROGRADE/URETEROSCOPY AND RENAL WASHINGS;  Surgeon: [name], MD;  Location[facility];  Service: Urology;  Laterality: Left;   ESOPHAGOGASTRODUODENOSCOPY (EGD) WITH PROPOFOL N/A 04/27/2016   normal esophagus s/p dilation, small hiatal hernia, normal duodenum   ESOPHAGOGASTRODUODENOSCOPY (EGD) WITH PROPOFOL  N/A 03/06/2019   erosive reflux esophagitis, s/p dilation, normal duodenum, abnormal gastric mucosa s/p biopsy. Hyperplastic gastric polyp. No H.pylori.    EXTRACORPOREAL SHOCK WAVE LITHOTRIPSY  yrs ago   FRACTURE SURGERY  child   Bilateral lower arms    KNEE ARTHROSCOPY Right 2013   MALONEY DILATION N/A 04/27/2016   Procedure: Venia Minks DILATION;  Surgeon: [name], MD;  Location: [facility];  Service: Endoscopy;  Laterality: N/A;   MALONEY DILATION N/A 03/06/2019   Procedure: Venia Minks DILATION;  Surgeon: [name], MD;  Location[facility];  Service: Endoscopy;  Laterality: N/A;   PARTIAL KNEE ARTHROPLASTY Right 04/26/2015   Procedure: RIGHT KNEE MEDIAL UNICOMPARTMENTAL ARTHROPLASTY;  Surgeon: Gaynelle Arabian, MD;  Location: [facility];  Service: Orthopedics;  Laterality: Right;   POLYPECTOMY  04/27/2016   Procedure: POLYPECTOMY;  Surgeon: [name], MD;  Location: [facility];  Service: Endoscopy;;  cecal , ascending, descending, and sigmoid polypectomies  PROSTATE BIOPSY  09/26/2011   Procedure: BIOPSY TRANSRECTAL ULTRASONIC PROSTATE (TUBP);  Surgeon: [name], MD;  Location: [facility];  Service: Urology;  Laterality: N/A;      TRANSTHORACIC ECHOCARDIOGRAM  01-07-2013   dr [name]   ef 60-65%,  grade 1 diastolic dysfunction/  mild AR without stenosis/  mild LAE/  mild to moderate calcificed MV annulus without regurg. or stenosis/   UMBILICAL HERNIA REPAIR  yrs ago    SOCIAL HISTORY: Social History   Socioeconomic History   Marital status: Married    Spouse name: Not on file   Number of children: 3   Years of education: 12   Highest education level: Some college, no degree  Occupational History   Occupation: Retired    Comment: Hotel manager    Comment: Part Time at [employer] Use   Smoking status: Every Day    Packs/day: 0.50    Years: 42.00    Pack years: 21.00    Types: Cigarettes   Smokeless tobacco: Never  Vaping Use   Vaping Use: Never  used  Substance and Sexual Activity   Alcohol use: No    Alcohol/week: 0.0 standard drinks   Drug use: No   Sexual activity: Yes    Birth control/protection: None  Other Topics Concern   Not on file  Social History Narrative   Patient is retired from the [employer]. He lives in a one story home with his wife. He moved from [facility] about 9 years ago. They do not have family on the [place] and although he is very outgoing he feels socially isolated. He isn't a member of any groups or churches.    Social Determinants of Health   Financial Resource Strain: High Risk   Difficulty of Paying Living Expenses: Hard  Food Insecurity: Not on file  Transportation Needs: No Transportation Needs   Lack of Transportation (Medical): No   Lack of Transportation (Non-Medical): No  Physical Activity: Not on file  Stress: Not on file  Social Connections: Not on file  Intimate Partner Violence: Not At Risk   Fear of Current or Ex-Partner: No   Emotionally Abused: No   Physically Abused: No   Sexually Abused: No    FAMILY HISTORY: Family History  Problem Relation Age of Onset   Stroke Mother    Alzheimer's disease Mother 92       probably due to head injury   Mesothelioma Father 92   Kidney cancer Father    Hyperlipidemia Sister    Heart attack Sister    Kidney cancer Sister        dx in her 16s   Heart disease Brother    Kidney cancer Brother 36       dx in his 30s   Hyperlipidemia Brother    Kidney cancer Brother        dx in his 42s   Healthy Daughter    Healthy Son    Healthy Son    Colon cancer Neg Hx     ALLERGIES:  is allergic to carbidopa-levodopa, morphine sulfate, sulfa antibiotics, and sulfacetamide sodium.  MEDICATIONS:  Current Outpatient Medications  Medication Sig Dispense Refill   abiraterone acetate (ZYTIGA) 250 MG tablet Take 2 tablets (500 mg total) by mouth daily. Take on an empty stomach 1 hour before or 2 hours after a meal 120 tablet 3    allopurinol (ZYLOPRIM) 300 MG tablet Take 1 tablet by mouth once  daily 90 tablet 0   amLODipine (NORVASC) 5 MG tablet Take 1 tablet (5 mg total) by mouth daily. 90 tablet 3   carvedilol (COREG) 25 MG tablet Take 1 tablet by mouth twice daily 180 tablet 0   DULoxetine (CYMBALTA) 60 MG capsule Take 1 capsule (60 mg total) by mouth daily. Take 1 capsule by mouth once daily (Needs to be seen before next refill) 90 capsule 3   losartan (COZAAR) 25 MG tablet Take 1 tablet (25 mg total) by mouth daily. 90 tablet 3   nystatin (MYCOSTATIN/NYSTOP) powder Apply 1 application topically 3 (three) times daily. 15 g 0   pantoprazole (PROTONIX) 40 MG tablet Take 1 tablet (40 mg total) by mouth 2 (two) times daily before a meal. 180 tablet 0   potassium chloride SA (KLOR-CON) 20 MEQ tablet Take 1 tablet (20 mEq total) by mouth 2 (two) times daily. 60 tablet 3   pregabalin (LYRICA) 25 MG capsule Take 1 capsule by mouth twice daily 60 capsule 3   rosuvastatin (CRESTOR) 20 MG tablet TAKE 1 TABLET BY MOUTH ONCE DAILY -  STOP  PRAVASTATIN -- NEEDS FOLLOW UP APPOINTMENT FOR REFILLS 15 tablet 0   tamsulosin (FLOMAX) 0.4 MG CAPS capsule Take 1 capsule (0.4 mg total) by mouth daily. 90 capsule 3   traMADol (ULTRAM) 50 MG tablet Take 1 tablet (50 mg total) by mouth daily as needed. 30 tablet 1   No current facility-administered medications for this visit.    REVIEW OF SYSTEMS:   Review of Systems  Constitutional: Negative.  Negative for chills, fever, malaise/fatigue and weight loss.  HENT:  Negative for congestion, ear pain and tinnitus.   Eyes: Negative.  Negative for blurred vision and double vision.  Respiratory: Negative.  Negative for cough, sputum production and shortness of breath.   Cardiovascular: Negative.  Negative for chest pain, palpitations and leg swelling.  Gastrointestinal: Negative.  Negative for abdominal pain, constipation, diarrhea, nausea and vomiting.  Genitourinary:  Negative for dysuria,  frequency and urgency.  Musculoskeletal:  Negative for back pain and falls.  Skin: Negative.  Negative for rash.  Neurological: Negative.  Negative for weakness and headaches.  Endo/Heme/Allergies: Negative.  Does not bruise/bleed easily.  Psychiatric/Behavioral: Negative.  Negative for depression. The patient is not nervous/anxious and does not have insomnia.    PHYSICAL EXAMINATION: ECOG PERFORMANCE STATUS: 2 - Symptomatic, <50% confined to bed  Vitals:   11/10/20 1001  BP: 123/68  Pulse: (!) 58  Resp: 18  Temp: (!) 96.8 F (36 C)  SpO2: 99%   Filed Weights   11/10/20 1001  Weight: 253 lb (114.8 kg)    Physical Exam Vitals reviewed.  Constitutional:      Appearance: Normal appearance. He is obese.  Pulmonary:     Effort: Pulmonary effort is normal.  Neurological:     Mental Status: He is alert and oriented to person, place, and time.    LABORATORY DATA:  I have reviewed the data as listed Lab Results  Component Value Date   WBC 4.1 11/10/2020   HGB 10.4 (L) 11/10/2020   HCT 32.0 (L) 11/10/2020   MCV 94.1 11/10/2020   PLT 184 11/10/2020   Recent Labs    10/07/20 2001 10/26/20 1014 11/10/20 0917  NA 138 136 135  K 3.0* 3.5 3.2*  CL 108 107 109  CO2 23 22 20*  GLUCOSE 88 122* 122*  BUN 15 35* 22  CREATININE 1.83* 2.45* 2.82*  CALCIUM 8.0* 8.1* 8.0*  GFRNONAA 38* 27* 23*  PROT 6.7 6.6 6.3*  ALBUMIN 3.5 3.4* 3.3*  AST 16 12* 13*  ALT 11 14 10   ALKPHOS 71 62 69  BILITOT 0.3 0.6 0.6    RADIOGRAPHIC STUDIES: I have personally reviewed the radiological images as listed and agreed with the findings in the report. No results found.  ASSESSMENT & PLAN:   Prostate cancer- He was unable to tolerate Zytiga due to diarrhea which resulted in acute on chronic renal failure.  He was instructed to hold until 11/01/2020 and to resume at 500 mg daily (half his normal dose ) and to aggressively hydrate.  Since he restarted approximately 10 days ago, he denies any  additional diarrhea although he is drinking more fluids.  He is agreeable for IV hydration and I will get this set up for him.  Chronic kidney disease- Creatinine has been fluctuating for several years but has worsened over the past month.  Creatinine today is 2.82.  I recommend he continue to hold his Zytiga and get set up for IV fluids or drink as much fluid as he can at home.  Historically has only been able to drink about 50 mL/day.  He will need to return to clinic weekly for lab check until we can stabilize his kidney function. He has not been seen by nephrology. Will refer to nephrology.    Right knee pain- This is chronic.  Reports possibly needing a knee replacement in the future although he is reluctant.  Continue topical creams and Tylenol.  No NSAIDs.  Disposition- IV fluids.  Stop Zytiga until we can stabilize his kidney function.  Return weekly for lab draw (cmp) until kidney function improves and then we can restart Zytiga at 500 mg again.   No problem-specific Assessment & Plan notes found for this encounter.  No orders of the defined types were placed in this encounter.  I spent 15 minutes dedicated to the care of this patient (face-to-face and non-face-to-face) on the date of the encounter to include what is described in the assessment and plan.   Jacquelin Hawking, NP 11/10/2020 10:41 AM

## 2020-11-10 NOTE — Patient Instructions (Addendum)
[place] at [facility] Discharge Instructions  You were seen today by Faythe Casa, NP. Please increase your fluid intake. You can mix crystal light flavoring packets into your water 1:1 ratio. Just make sure that it does not have much caffeine content. She will call you with Zytiga dose after she discusses it with Dr. Delton Coombes.  Please follow up as scheduled.   Thank you for choosing [place] at [facility] to provide your oncology and hematology care.  To afford each patient quality time with our provider, please arrive at least 15 minutes before your scheduled appointment time.   If you have a lab appointment with the [facility] please come in thru the Main Entrance and check in at the main information desk.  You need to re-schedule your appointment should you arrive 10 or more minutes late.  We strive to give you quality time with our providers, and arriving late affects you and other patients whose appointments are after yours.  Also, if you no show three or more times for appointments you may be dismissed from the clinic at the providers discretion.     Again, thank you for choosing [facility].  Our hope is that these requests will decrease the amount of time that you wait before being seen by our physicians.       _____________________________________________________________  Should you have questions after your visit to [facility], please contact our office at [phone] and follow the prompts.  Our office hours are 8:00 a.m. and 4:30 p.m. Monday - Friday.  Please note that voicemails left after 4:00 p.m. may not be returned until the following business day.  We are closed weekends and major holidays.  You do have access to a nurse 24-7, just call the main number to the clinic [phone] and do not press any options, hold on the line and a nurse will answer the phone.    For prescription refill  requests, have your pharmacy contact our office and allow 72 hours.    Due to Covid, you will need to wear a mask upon entering the hospital. If you do not have a mask, a mask will be given to you at the Main Entrance upon arrival. For doctor visits, patients may have 1 support person age 53 or older with them. For treatment visits, patients can not have anyone with them due to social distancing guidelines and our immunocompromised population.

## 2020-11-11 LAB — TESTOSTERONE: Testosterone: <3 ng/dL — ABNORMAL LOW (ref 264–916)

## 2020-11-11 MED ORDER — SODIUM CHLORIDE 0.9 % IV SOLN: Freq: Once | INTRAVENOUS | Status: DC

## 2020-11-11 NOTE — Addendum Note (Signed)
Addended by: Faythe Casa E on: 11/11/2020 01:01 PM   Modules accepted: Orders

## 2020-11-12 ENCOUNTER — Other Ambulatory Visit: Payer: Self-pay

## 2020-11-12 ENCOUNTER — Encounter (HOSPITAL_COMMUNITY): Payer: Self-pay

## 2020-11-12 ENCOUNTER — Inpatient Hospital Stay (HOSPITAL_COMMUNITY): Payer: Medicare Other

## 2020-11-12 VITALS — BP 115/66 | HR 62 | Temp 97.1°F | Resp 18

## 2020-11-12 DIAGNOSIS — N184 Chronic kidney disease, stage 4 (severe): Secondary | ICD-10-CM

## 2020-11-12 DIAGNOSIS — C61 Malignant neoplasm of prostate: Secondary | ICD-10-CM

## 2020-11-12 DIAGNOSIS — F17210 Nicotine dependence, cigarettes, uncomplicated: Secondary | ICD-10-CM | POA: Diagnosis not present

## 2020-11-12 DIAGNOSIS — N183 Chronic kidney disease, stage 3 unspecified: Secondary | ICD-10-CM | POA: Diagnosis not present

## 2020-11-12 DIAGNOSIS — I129 Hypertensive chronic kidney disease with stage 1 through stage 4 chronic kidney disease, or unspecified chronic kidney disease: Secondary | ICD-10-CM | POA: Diagnosis not present

## 2020-11-12 DIAGNOSIS — Z923 Personal history of irradiation: Secondary | ICD-10-CM | POA: Diagnosis not present

## 2020-11-12 DIAGNOSIS — M25561 Pain in right knee: Secondary | ICD-10-CM | POA: Diagnosis not present

## 2020-11-12 MED ORDER — SODIUM CHLORIDE 0.9 % IV SOLN: Freq: Once | INTRAVENOUS | Status: AC

## 2020-11-12 MED ORDER — POTASSIUM CHLORIDE IN NACL 20-0.9 MEQ/L-% IV SOLN
Freq: Once | INTRAVENOUS | Status: AC
  Filled 2020-11-12: qty 1000

## 2020-11-12 MED ORDER — SODIUM CHLORIDE 0.9 % IV SOLN: Freq: Once | INTRAVENOUS | Status: DC

## 2020-11-12 MED ORDER — SODIUM CHLORIDE 0.9 % IV SOLN: INTRAVENOUS | Status: DC

## 2020-11-12 NOTE — Progress Notes (Signed)
Patient tolerated hydration with no complaints voiced.  Peripheral IV site clean and dry with good blood return noted before and after hydration.  No bruising or swelling noted with port.  Band aid applied.  VSS with discharge and left ambulatory with no s/s of distress noted.    

## 2020-11-12 NOTE — Patient Instructions (Signed)
[place]  Discharge Instructions: Thank you for choosing [facility] to provide your oncology and hematology care.  If you have a lab appointment with the [facility], please come in thru the Main Entrance and check in at the main information desk.  Wear comfortable clothing and clothing appropriate for easy access to any Portacath or PICC line.   We strive to give you quality time with your provider. You may need to reschedule your appointment if you arrive late (15 or more minutes).  Arriving late affects you and other patients whose appointments are after yours.  Also, if you miss three or more appointments without notifying the office, you may be dismissed from the clinic at the provider's discretion.      For prescription refill requests, have your pharmacy contact our office and allow 72 hours for refills to be completed.    Today you received hydration with potassium today.    To help prevent nausea and vomiting after your treatment, we encourage you to take your nausea medication as directed.  BELOW ARE SYMPTOMS THAT SHOULD BE REPORTED IMMEDIATELY: *FEVER GREATER THAN 100.4 F (38 C) OR HIGHER *CHILLS OR SWEATING *NAUSEA AND VOMITING THAT IS NOT CONTROLLED WITH YOUR NAUSEA MEDICATION *UNUSUAL SHORTNESS OF BREATH *UNUSUAL BRUISING OR BLEEDING *URINARY PROBLEMS (pain or burning when urinating, or frequent urination) *BOWEL PROBLEMS (unusual diarrhea, constipation, pain near the anus) TENDERNESS IN MOUTH AND THROAT WITH OR WITHOUT PRESENCE OF ULCERS (sore throat, sores in mouth, or a toothache) UNUSUAL RASH, SWELLING OR PAIN  UNUSUAL VAGINAL DISCHARGE OR ITCHING   Items with * indicate a potential emergency and should be followed up as soon as possible or go to the Emergency Department if any problems should occur.  Please show the CHEMOTHERAPY ALERT CARD or IMMUNOTHERAPY ALERT CARD at check-in to the Emergency Department and triage nurse.  Should  you have questions after your visit or need to cancel or reschedule your appointment, please contact [facility] [phone]  and follow the prompts.  Office hours are 8:00 a.m. to 4:30 p.m. Monday - Friday. Please note that voicemails left after 4:00 p.m. may not be returned until the following business day.  We are closed weekends and major holidays. You have access to a nurse at all times for urgent questions. Please call the main number to the clinic [phone] and follow the prompts.  For any non-urgent questions, you may also contact your provider using MyChart. We now offer e-Visits for anyone 40 and older to request care online for non-urgent symptoms. For details visit [email or web address].   Also download the MyChart app! Go to the app store, search "MyChart", open the app, select [place], and log in with your MyChart username and password.  Due to Covid, a mask is required upon entering the hospital/clinic. If you do not have a mask, one will be given to you upon arrival. For doctor visits, patients may have 1 support person aged 61 or older with them. For treatment visits, patients cannot have anyone with them due to current Covid guidelines and our immunocompromised population.

## 2020-11-15 ENCOUNTER — Inpatient Hospital Stay (HOSPITAL_COMMUNITY): Payer: Medicare Other

## 2020-11-18 ENCOUNTER — Emergency Department (HOSPITAL_COMMUNITY): Payer: Medicare Other

## 2020-11-18 ENCOUNTER — Telehealth: Payer: Medicare Other | Admitting: *Deleted

## 2020-11-18 ENCOUNTER — Encounter (HOSPITAL_COMMUNITY): Payer: Self-pay | Admitting: *Deleted

## 2020-11-18 ENCOUNTER — Telehealth: Payer: Self-pay | Admitting: *Deleted

## 2020-11-18 ENCOUNTER — Emergency Department (HOSPITAL_COMMUNITY)
Admission: EM | Admit: 2020-11-18 | Discharge: 2020-11-18 | Disposition: A | Discharge status: Home / Self Care | Payer: Medicare Other | Attending: Emergency Medicine | Admitting: Emergency Medicine

## 2020-11-18 ENCOUNTER — Other Ambulatory Visit: Payer: Self-pay

## 2020-11-18 DIAGNOSIS — Z8546 Personal history of malignant neoplasm of prostate: Secondary | ICD-10-CM | POA: Diagnosis not present

## 2020-11-18 DIAGNOSIS — R0781 Pleurodynia: Secondary | ICD-10-CM | POA: Diagnosis not present

## 2020-11-18 DIAGNOSIS — N184 Chronic kidney disease, stage 4 (severe): Secondary | ICD-10-CM | POA: Insufficient documentation

## 2020-11-18 DIAGNOSIS — W01198A Fall on same level from slipping, tripping and stumbling with subsequent striking against other object, initial encounter: Secondary | ICD-10-CM | POA: Diagnosis not present

## 2020-11-18 DIAGNOSIS — J449 Chronic obstructive pulmonary disease, unspecified: Secondary | ICD-10-CM | POA: Insufficient documentation

## 2020-11-18 DIAGNOSIS — I129 Hypertensive chronic kidney disease with stage 1 through stage 4 chronic kidney disease, or unspecified chronic kidney disease: Secondary | ICD-10-CM | POA: Insufficient documentation

## 2020-11-18 DIAGNOSIS — S299XXA Unspecified injury of thorax, initial encounter: Secondary | ICD-10-CM | POA: Diagnosis present

## 2020-11-18 DIAGNOSIS — S20211A Contusion of right front wall of thorax, initial encounter: Secondary | ICD-10-CM | POA: Diagnosis not present

## 2020-11-18 DIAGNOSIS — S8001XA Contusion of right knee, initial encounter: Secondary | ICD-10-CM | POA: Diagnosis not present

## 2020-11-18 DIAGNOSIS — Z79899 Other long term (current) drug therapy: Secondary | ICD-10-CM | POA: Diagnosis not present

## 2020-11-18 DIAGNOSIS — F17200 Nicotine dependence, unspecified, uncomplicated: Secondary | ICD-10-CM | POA: Diagnosis not present

## 2020-11-18 DIAGNOSIS — M25561 Pain in right knee: Secondary | ICD-10-CM | POA: Insufficient documentation

## 2020-11-18 DIAGNOSIS — M25461 Effusion, right knee: Secondary | ICD-10-CM | POA: Diagnosis not present

## 2020-11-18 DIAGNOSIS — I2510 Atherosclerotic heart disease of native coronary artery without angina pectoris: Secondary | ICD-10-CM | POA: Diagnosis not present

## 2020-11-18 DIAGNOSIS — Z043 Encounter for examination and observation following other accident: Secondary | ICD-10-CM | POA: Diagnosis not present

## 2020-11-18 MED ORDER — LIDOCAINE 5 % EX PTCH: 1.0000 | MEDICATED_PATCH | CUTANEOUS | 0 refills | Status: DC

## 2020-11-18 MED ORDER — LIDOCAINE 5 % EX PTCH
1.0000 | MEDICATED_PATCH | CUTANEOUS | Status: DC
  Administered 2020-11-18: 1 via TRANSDERMAL
  Filled 2020-11-18: qty 1

## 2020-11-18 MED ORDER — HYDROCODONE-ACETAMINOPHEN 5-325 MG PO TABS: 1.0000 | ORAL_TABLET | Freq: Four times a day (QID) | ORAL | 0 refills | Status: DC | PRN

## 2020-11-18 MED ORDER — ACETAMINOPHEN 325 MG PO TABS
650.0000 mg | ORAL_TABLET | Freq: Once | ORAL | Status: AC
  Administered 2020-11-18: 650 mg via ORAL
  Filled 2020-11-18: qty 2

## 2020-11-18 NOTE — Progress Notes (Deleted)
  Care Management   Follow Up Note   11/18/2020 Name: [name] MRN: [id number] DOB: September 15, 1945   Referred by: [name], [name], MD Reason for referral : No chief complaint on file.   Patient scheduled for a routine CCM telephone follow-up. No answer at primary number and chart review showed that he sought care at [facility] ED today for a fall that he sustained yesterday.   Follow Up Plan:  RN Care Manager will follow-up with patient by telephone over the next 5 days.   [name], BSN, RN-BC Embedded Chronic Care Manager [facility] [place] [facility] Direct Dial: [phone]

## 2020-11-18 NOTE — Discharge Instructions (Addendum)
Your x-rays are negative for any bony injury, no obvious fractures, dislocation or problems with your right knee surgical hardware.  I recommend using ice packs to your knee, elevation is much as possible over the next several days.  You may also add a heating job starting on Saturday to the knee 20 minutes 2 or 3 times daily.  You have been prescribed pain medication, this medication will make you drowsy, do not drive within 4 hours of taking it.  I do not encourage you to continue using Aleve as this can be harmful to your kidneys.  Use incentive spirometer as directed.  Get rechecked if you have any shortness of breath, cough or fever as these can be symptoms of pneumonia which are the #1 complication of chest wall injuries.  Plan.  Follow-up with your primary doctor and/or Dr. Wynelle Link if you continue to have problems with the right knee.

## 2020-11-18 NOTE — ED Provider Notes (Addendum)
[facility] EMERGENCY DEPARTMENT Provider Note   CSN: [id number] Arrival date & time: 11/18/20  1221     History Chief Complaint  Patient presents with   [name] is a 75 y.o. male with a history including CAD, chronic kidney disease, hypertension and history of prostate cancer presenting for evaluation of right knee pain and right rib cage pain after tripping and falling yesterday when visiting his wife at a local nursing facility who recently broke her hip.  He describes tripping and falling onto hard flooring forward striking his right knee and falling on his right side with his elbow tucked into his ribs.  He denies shortness of breath but he does have significant pain with deep inspiration in his right lateral rib cage.  He did not hit his head, denies headache, dizziness.  He also denies any focal weakness or numbness in his extremities.  He is very concerned about his right knee as he has had arthroplasty and is concerned he may have damaged his surgery.  He is ambulatory using a walker.  He denies weakness or numbness distal to the injury site.  He has taken Tylenol with no symptom relief.  The history is provided by the patient.      Past Medical History:  Diagnosis Date   Aortic atherosclerosis ([place])    2004  s/p  closure Penetrating atherosclerotic ulcer of infarenal aorta w/ endovascular stent graft   Arthritis    CKD (chronic kidney disease), stage III (HCC)    Closed fracture of head of humerus 07/2017   right shoulder from fall; 09-25-2017 per pt no surgical intervention, only wore sling, intermittant pain   Coronary atherosclerosis of native coronary artery    positive myoview for ischemia 09-27-2009;  10-01-2009 per cardiac cath-- minimal nonobstructive CAD w/ 30% LAD    ED (erectile dysfunction)    Emphysema/COPD ([place])    followed by pcp--- last exacerbation 09-12-2017;  09-25-2017 per pt no cough, sob or congestion   Essential hypertension     Family history of kidney cancer    First degree heart block    Frequency of urination    GERD (gastroesophageal reflux disease)    Gout    09-25-2017  per pt stable,  last episode 2015   Heart murmur    History of adenomatous polyp of colon    History of closed head injury 2005   per pt residual resolved   History of external beam radiation therapy    completed 10/ 2015 for prostate cancer   History of gastric ulcer 2014   History of kidney stones    History of pneumothorax    spontaneous pneumo treated w/ chest tube   History of rib fracture 07/2017   from fall,  right side 6th,7th,8th   Neuropathy    OAB (overactive bladder)    OSA (obstructive sleep apnea)    Intolerant of CPAP   Prostate cancer ([place])    dx 09-26-2011 via bx-- Stage T3b,N0,  Gleason 4+4, PSA 11.2 with METs to external iliac lymph node(resolved with ADT)-- treated w/ ADT ;   05/2013 staging work-up for rising PSA , started casodex and completed radiation therapy 10/ 2015;   rising PSA post treatment   RLS (restless legs syndrome)    Ureteral neocystostomy bleed    left side   Urge urinary incontinence     Patient Active Problem List   Diagnosis Date Noted   Right knee pain 10/27/2020  Fall 09/28/2020   Genetic testing 35/00/9381   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 01/19/2020   Family history of kidney cancer    Constipation 03/04/2019   Chronic kidney disease (CKD), stage IV (severe) ([place]) 07/10/2018   Depression, recurrent ([place]) 04/08/2018   Dysphagia 07/10/2016   Neuropathy 03/17/2015   Prostate cancer ([place]) 06/30/2013   Abdominal aneurysm without mention of rupture 02/19/2013   Morbid obesity ([place]) 01/14/2013   VITAMIN B12 DEFICIENCY 01/14/2013   TOBACCO ABUSE 01/14/2013   PTSD 01/14/2013   COPD (chronic obstructive pulmonary disease) (HCC)    OSA (obstructive sleep apnea) 08/14/2012   RLS (restless legs syndrome) 08/14/2012   Prediabetes 08/14/2012   Aortic atherosclerosis ([place])  02/19/2012   Coronary atherosclerosis of native coronary artery 03/23/2010   GERD 07/30/2008   History of colonic polyps 07/30/2008   Hyperlipidemia 01/31/2007   GOUT 01/31/2007   Essential hypertension, benign 01/31/2007    Past Surgical History:  Procedure Laterality Date   ABDOMINAL AORTIC ENDOVASCULAR STENT GRAFT  09-13-2007    dr [name]  [facility]   closure penetrating atherosclerotic ulcer of infarenal aorta with stent graft   BIOPSY  03/06/2019   Procedure: BIOPSY;  Surgeon: [name], MD;  Location: [facility] ENDO SUITE;  Service: Endoscopy;;  gastric   CARDIAC CATHETERIZATION  2005   per pt normal (done in [place])   [place]  10/06/2009    dr cooper   minimal nonobstructive CAD w/30% LAD otherwise normal coronaries, LVEDP 77mmHg   CARDIOVASCULAR STRESS TEST  09/27/2009    dr Aundra Dubin   lexiscan nuclear study w/ moderate reversible inferior perfusion defect ischemia,  ef 60% (cardiac cath scheduled)   CATARACT EXTRACTION W/PHACO Right 07/12/2020   Procedure: CATARACT EXTRACTION PHACO AND INTRAOCULAR LENS PLACEMENT RIGHT EYE;  Surgeon: [name], MD;  Location: [facility] ORS;  Service: Ophthalmology;  Laterality: Right;  CDE=15.48   CHEST TUBE INSERTION  1989   "collapsed lung"due to injury   CIRCUMCISION  09/26/2011   Procedure: CIRCUMCISION ADULT;  Surgeon: [name], MD;  Location: [facility] ORS;  Service: Urology;  Laterality: N/A;   COLONOSCOPY W/ POLYPECTOMY     COLONOSCOPY WITH PROPOFOL N/A 04/27/2016    eight 4-8 mm polyps in descending colon, at splenic flexure, in ascending colon and cecum. Tubular adenomas. Surveillance in 3 years.   CYSTOSCOPY  09/26/2011   Procedure: CYSTOSCOPY;  Surgeon: [name], MD;  Location: [facility] ORS;  Service: Urology;  Laterality: N/A;   CYSTOSCOPY/RETROGRADE/URETEROSCOPY Left 09/27/2017   Procedure: CYSTOSCOPYLEFT /RETROGRADE/URETEROSCOPY AND RENAL WASHINGS;  Surgeon: [name], MD;  Location: [facility];  Service:  Urology;  Laterality: Left;   ESOPHAGOGASTRODUODENOSCOPY (EGD) WITH PROPOFOL N/A 04/27/2016   normal esophagus s/p dilation, small hiatal hernia, normal duodenum   ESOPHAGOGASTRODUODENOSCOPY (EGD) WITH PROPOFOL N/A 03/06/2019   erosive reflux esophagitis, s/p dilation, normal duodenum, abnormal gastric mucosa s/p biopsy. Hyperplastic gastric polyp. No H.pylori.    EXTRACORPOREAL SHOCK WAVE LITHOTRIPSY  yrs ago   FRACTURE SURGERY  child   Bilateral lower arms    KNEE ARTHROSCOPY Right 2013   MALONEY DILATION N/A 04/27/2016   Procedure: Venia Minks DILATION;  Surgeon: [name], MD;  Location[facility];  Service: Endoscopy;  Laterality: N/A;   MALONEY DILATION N/A 03/06/2019   Procedure: Venia Minks DILATION;  Surgeon: [name], MD;  Location: [facility];  Service: Endoscopy;  Laterality: N/A;   PARTIAL KNEE ARTHROPLASTY Right 04/26/2015   Procedure: RIGHT KNEE MEDIAL UNICOMPARTMENTAL ARTHROPLASTY;  Surgeon: Gaynelle Arabian, MD;  Location: [facility] OR[facility];  Service: Orthopedics;  Laterality: Right;   POLYPECTOMY  04/27/2016   Procedure: POLYPECTOMY;  Surgeon: [name], MD;  [facility];  Service: Endoscopy;;  cecal , ascending, descending, and sigmoid polypectomies   PROSTATE BIOPSY  09/26/2011   Procedure: BIOPSY TRANSRECTAL ULTRASONIC PROSTATE (TUBP);  Surgeon: [name], MD;  Location: [facility];  Service: Urology;  Laterality: N/A;      TRANSTHORACIC ECHOCARDIOGRAM  01-07-2013   dr [name]   ef 60-65%,  grade 1 diastolic dysfunction/  mild AR without stenosis/  mild LAE/  mild to moderate calcificed MV annulus without regurg. or stenosis/   UMBILICAL HERNIA REPAIR  yrs ago       Family History  Problem Relation Age of Onset   Stroke Mother    Alzheimer's disease Mother 17       probably due to head injury   Mesothelioma Father 57   Kidney cancer Father    Hyperlipidemia Sister    Heart attack Sister    Kidney cancer Sister        dx in her 33s   Heart disease  Brother    Kidney cancer Brother 3       dx in his 59s   Hyperlipidemia Brother    Kidney cancer Brother        dx in his 32s   Healthy Daughter    Healthy Son    Healthy Son    Colon cancer Neg Hx     Social History   Tobacco Use   Smoking status: Every Day    Packs/day: 0.50    Years: 42.00    Pack years: 21.00    Types: Cigarettes   Smokeless tobacco: Never  Vaping Use   Vaping Use: Never used  Substance Use Topics   Alcohol use: No    Alcohol/week: 0.0 standard drinks   Drug use: No    Home Medications Prior to Admission medications   Medication Sig Start Date End Date Taking? Authorizing Provider  HYDROcodone-acetaminophen (NORCO/VICODIN) 5-325 MG tablet Take 1 tablet by mouth every 6 (six) hours as needed. 11/18/20  Yes Tyera Hansley, Almyra Free, PA-C  lidocaine (LIDODERM) 5 % Place 1 patch onto the skin daily. Remove & Discard patch within 12 hours or as directed by MD 11/18/20  Yes Zayra Devito, Almyra Free, PA-C  abiraterone acetate (ZYTIGA) 250 MG tablet Take 2 tablets (500 mg total) by mouth daily. Take on an empty stomach 1 hour before or 2 hours after a meal 10/27/20   Heath Lark, MD  allopurinol (ZYLOPRIM) 300 MG tablet Take 1 tablet by mouth once daily 11/04/20   [name], [name], MD  amLODipine (NORVASC) 5 MG tablet Take 1 tablet (5 mg total) by mouth daily. 10/14/20   [name], [name], MD  carvedilol (COREG) 25 MG tablet Take 1 tablet by mouth twice daily 09/16/20   [name], [name], MD  DULoxetine (CYMBALTA) 60 MG capsule Take 1 capsule (60 mg total) by mouth daily. Take 1 capsule by mouth once daily (Needs to be seen before next refill) 05/19/20   [name], [name], MD  losartan (COZAAR) 25 MG tablet Take 1 tablet (25 mg total) by mouth daily. 10/14/20   [name], [name], MD  nystatin (MYCOSTATIN/NYSTOP) powder Apply 1 application topically 3 (three) times daily. 04/19/20   [name], [name], MD  pantoprazole (PROTONIX) 40 MG tablet Take 1 tablet (40 mg total) by mouth 2 (two)  times daily before a meal. 11/01/20   Gwenlyn Perking, FNP  potassium chloride SA (KLOR-CON) 20 MEQ tablet Take 1 tablet (20 mEq total) by mouth 2 (two) times daily. 09/24/20   Harriett Rush, PA-C  pregabalin (LYRICA) 25 MG capsule Take 1 capsule by mouth twice daily 05/04/20   Derek Jack, MD  rosuvastatin (CRESTOR) 20 MG tablet TAKE 1 TABLET BY MOUTH ONCE DAILY -  STOP  PRAVASTATIN -- NEEDS FOLLOW UP APPOINTMENT FOR REFILLS 09/30/20   Satira Sark, MD  tamsulosin (FLOMAX) 0.4 MG CAPS capsule Take 1 capsule (0.4 mg total) by mouth daily. 04/29/20   [name], MD  traMADol (ULTRAM) 50 MG tablet Take 1 tablet (50 mg total) by mouth daily as needed. 05/19/20   [name], [name], MD    Allergies    Carbidopa-levodopa, Morphine sulfate, Sulfa antibiotics, and Sulfacetamide sodium  Review of Systems   Review of Systems  Constitutional:  Negative for fever.  HENT: Negative.    Respiratory: Negative.    Cardiovascular:  Positive for chest pain. Negative for palpitations and leg swelling.  Gastrointestinal: Negative.   Musculoskeletal:  Positive for arthralgias and joint swelling. Negative for myalgias.  Skin: Negative.   Neurological:  Negative for weakness, numbness and headaches.  All other systems reviewed and are negative.  Physical Exam Updated Vital Signs BP 121/69   Pulse 65   Temp 98.2 F (36.8 C) (Oral)   Resp 18   SpO2 98%   Physical Exam Constitutional:      Appearance: He is well-developed.  HENT:     Head: Normocephalic and atraumatic.  Cardiovascular:     Rate and Rhythm: Normal rate.     Comments: Pulses equal bilaterally Pulmonary:     Effort: Pulmonary effort is normal.  Chest:     Chest wall: Tenderness present. No deformity or swelling.     Comments: Tender to palpation along right mid chest wall at midclavicular line.  There is no palpable deformity, no flail, no crepitus. Musculoskeletal:        General: Tenderness present.      Cervical back: Normal range of motion.     Right knee: Bony tenderness present. No deformity or effusion. Normal alignment, normal meniscus and normal patellar mobility.     Comments: Well-healed surgical incision mid anterior knee.  There is bony tenderness at his patella without effusion.  No joint instability appreciated.  Skin:    General: Skin is warm and dry.  Neurological:     Mental Status: He is alert.     Sensory: No sensory deficit.     Motor: No weakness.     Deep Tendon Reflexes: Reflexes normal.    ED Results / Procedures / Treatments   Labs (all labs ordered are listed, but only abnormal results are displayed) Labs Reviewed - No data to display  EKG None  Radiology DG Ribs Unilateral W/Chest Right  Result Date: 11/18/2020 CLINICAL DATA:  Fall, pain and rib cage in RIGHT knee. EXAM: RIGHT RIBS AND CHEST - 3+ VIEW COMPARISON:  June 11, 2018. FINDINGS: Trachea is midline. Cardiomediastinal contours and hilar soft structures are stable accounting for slight rotation to the RIGHT on the current exam. No lobar consolidation. No sign of pleural effusion or visible pneumothorax. No acute skeletal process on limited assessment with marked glenohumeral degenerative changes on the RIGHT. IMPRESSION: No acute cardiopulmonary disease. Electronically Signed   By: Zetta Bills M.D.   On: 11/18/2020 14:23  DG Knee Complete 4 Views Right  Result Date: 11/18/2020 CLINICAL DATA:  Fall EXAM: RIGHT KNEE - COMPLETE 4+ VIEW COMPARISON:  X-ray dated July 23, 2015 FINDINGS: No acute fracture or dislocation. Prior knee arthroplasty of the medial compartment. Hardware is intact with no evidence of periprosthetic fracture or lucency. Unchanged moderate joint effusion. Soft tissues are unremarkable. IMPRESSION: No acute osseous abnormality. Electronically Signed   By: Yetta Glassman M.D.   On: 11/18/2020 14:21    Procedures Procedures   Medications Ordered in ED Medications  acetaminophen  (TYLENOL) tablet 650 mg (650 mg Oral Given 11/18/20 1357)    ED Course  I have reviewed the triage vital signs and the nursing notes.  Pertinent labs & imaging results that were available during my care of the patient were reviewed by me and considered in my medical decision making (see chart for details).    MDM Rules/Calculators/A&P                           Imaging reviewed and patient has no findings of rib or knee fracture or dislocation.  Given the chest wall injury he was given incentive spirometer and instructed in its use.  Discussed rest, ice, hydrocodone prescribed for pain relief, caution given regarding sedation.  He was also given a Lidoderm patch for his knee.  If this helps improve his knee pain he can also get a prescription of this filled as well.  Plan follow-up with his PCP and/or his orthopedist for any persistent or worsening symptoms. Final Clinical Impression(s) / ED Diagnoses Final diagnoses:  Contusion of right chest wall, initial encounter  Acute pain of right knee    Rx / DC Orders ED Discharge Orders          Ordered    HYDROcodone-acetaminophen (NORCO/VICODIN) 5-325 MG tablet  Every 6 hours PRN        11/18/20 1545    lidocaine (LIDODERM) 5 %  Every 24 hours        11/18/20 1545             [name] 11/19/20 0005    Russell Jefferson, PA-C 11/19/20 0005    Russell Fuse, MD 11/19/20 1446

## 2020-11-18 NOTE — ED Triage Notes (Signed)
Fall yesterday, pain in right rib cage and right knee

## 2020-11-18 NOTE — Telephone Encounter (Signed)
  Care Management   Follow Up Note   11/18/2020 Name: [name] MRN: [id number] DOB: 1945/04/23   Referred by: [name], [name], MD Reason for referral : No chief complaint on file.   Patient scheduled for a routine CCM telephone follow-up. No answer at primary number and chart review showed that he sought care at [facility] ED today for a fall that he sustained yesterday.   Follow Up Plan:  RN Care Manager will follow-up with patient by telephone over the next 5 days.   Chong Sicilian, BSN, RN-BC Embedded Chronic Care Manager [facility] [place] [facility] Direct Dial: [phone]

## 2020-11-22 ENCOUNTER — Inpatient Hospital Stay (HOSPITAL_COMMUNITY): Payer: Medicare Other

## 2020-11-23 ENCOUNTER — Ambulatory Visit (INDEPENDENT_AMBULATORY_CARE_PROVIDER_SITE_OTHER): Payer: Medicare Other | Admitting: General Surgery

## 2020-11-23 ENCOUNTER — Other Ambulatory Visit: Payer: Self-pay

## 2020-11-23 ENCOUNTER — Encounter: Payer: Self-pay | Admitting: General Surgery

## 2020-11-23 VITALS — BP 122/76 | HR 65 | Temp 97.8°F | Resp 18 | Ht 67.0 in | Wt 259.0 lb

## 2020-11-23 DIAGNOSIS — K432 Incisional hernia without obstruction or gangrene: Secondary | ICD-10-CM | POA: Insufficient documentation

## 2020-11-23 NOTE — Progress Notes (Signed)
[facility] History and Physical  Reason for Referral: Umbilical hernia  Referring Physician: Ed   Chief Complaint   New Patient (Initial Visit); Hernia     [name] is a 75 y.o. male.  HPI: [name] is a 75 yo who has had a prior umbilical hernia repair with mesh he thinks and noticed a sharp pain at his umbilicus and went to the ED. He had not nausea, vomiting or obstructive symptoms. He has since had resolution of all pain. He was scanned and CT demonstrated a fat containing hernia. He reports he cannot even tell anything is currently going on and he feels at his baseline. He does not wish to pursue any more operations at this time.   Past Medical History:  Diagnosis Date   Aortic atherosclerosis ([place])    2004  s/p  closure Penetrating atherosclerotic ulcer of infarenal aorta w/ endovascular stent graft   Arthritis    CKD (chronic kidney disease), stage III (HCC)    Closed fracture of head of humerus 07/2017   right shoulder from fall; 09-25-2017 per pt no surgical intervention, only wore sling, intermittant pain   Coronary atherosclerosis of native coronary artery    positive myoview for ischemia 09-27-2009;  10-01-2009 per cardiac cath-- minimal nonobstructive CAD w/ 30% LAD    ED (erectile dysfunction)    Emphysema/COPD ([place])    followed by pcp--- last exacerbation 09-12-2017;  09-25-2017 per pt no cough, sob or congestion   Essential hypertension    Family history of kidney cancer    First degree heart block    Frequency of urination    GERD (gastroesophageal reflux disease)    Gout    09-25-2017  per pt stable,  last episode 2015   Heart murmur    History of adenomatous polyp of colon    History of closed head injury 2005   per pt residual resolved   History of external beam radiation therapy    completed 10/ 2015 for prostate cancer   History of gastric ulcer 2014   History of kidney stones    History of pneumothorax    spontaneous  pneumo treated w/ chest tube   History of rib fracture 07/2017   from fall,  right side 6th,7th,8th   Neuropathy    OAB (overactive bladder)    OSA (obstructive sleep apnea)    Intolerant of CPAP   Prostate cancer ([place])    dx 09-26-2011 via bx-- Stage T3b,N0,  Gleason 4+4, PSA 11.2 with METs to external iliac lymph node(resolved with ADT)-- treated w/ ADT ;   05/2013 staging work-up for rising PSA , started casodex and completed radiation therapy 10/ 2015;   rising PSA post treatment   RLS (restless legs syndrome)    Ureteral neocystostomy bleed    left side   Urge urinary incontinence     Past Surgical History:  Procedure Laterality Date   ABDOMINAL AORTIC ENDOVASCULAR STENT GRAFT  09-13-2007    dr [name]  [facility]   closure penetrating atherosclerotic ulcer of infarenal aorta with stent graft   BIOPSY  03/06/2019   Procedure: BIOPSY;  Surgeon: [name], MD;  [facility];  Service: Endoscopy;;  gastric   CARDIAC CATHETERIZATION  2005   per pt normal (done in [place])   [place]  10/06/2009    dr cooper   minimal nonobstructive CAD w/30% LAD otherwise normal coronaries, LVEDP 22mmHg   CARDIOVASCULAR STRESS TEST  09/27/2009  dr Aundra Dubin   lexiscan nuclear study w/ moderate reversible inferior perfusion defect ischemia,  ef 60% (cardiac cath scheduled)   CATARACT EXTRACTION W/PHACO Right 07/12/2020   Procedure: CATARACT EXTRACTION PHACO AND INTRAOCULAR LENS PLACEMENT RIGHT EYE;  Surgeon: [name], MD;  Location: [facility];  Service: Ophthalmology;  Laterality: Right;  CDE=15.48   CHEST TUBE INSERTION  1989   "collapsed lung"due to injury   CIRCUMCISION  09/26/2011   Procedure: CIRCUMCISION ADULT;  Surgeon: [name], MD;  Location: [facility];  Service: Urology;  Laterality: N/A;   COLONOSCOPY W/ POLYPECTOMY     COLONOSCOPY WITH PROPOFOL N/A 04/27/2016    eight 4-8 mm polyps in descending colon, at splenic flexure, in ascending colon and cecum. Tubular  adenomas. Surveillance in 3 years.   CYSTOSCOPY  09/26/2011   Procedure: CYSTOSCOPY;  Surgeon: [name], MD;  Location: [facility];  Service: Urology;  Laterality: N/A;   CYSTOSCOPY/RETROGRADE/URETEROSCOPY Left 09/27/2017   Procedure: CYSTOSCOPYLEFT /RETROGRADE/URETEROSCOPY AND RENAL WASHINGS;  Surgeon: [name], MD;  Location: [facility];  Service: Urology;  Laterality: Left;   ESOPHAGOGASTRODUODENOSCOPY (EGD) WITH PROPOFOL N/A 04/27/2016   normal esophagus s/p dilation, small hiatal hernia, normal duodenum   ESOPHAGOGASTRODUODENOSCOPY (EGD) WITH PROPOFOL N/A 03/06/2019   erosive reflux esophagitis, s/p dilation, normal duodenum, abnormal gastric mucosa s/p biopsy. Hyperplastic gastric polyp. No H.pylori.    EXTRACORPOREAL SHOCK WAVE LITHOTRIPSY  yrs ago   FRACTURE SURGERY  child   Bilateral lower arms    KNEE ARTHROSCOPY Right 2013   MALONEY DILATION N/A 04/27/2016   Procedure: Venia Minks DILATION;  Surgeon: [name], MD;  [facility];  Service: Endoscopy;  Laterality: N/A;   MALONEY DILATION N/A 03/06/2019   Procedure: Venia Minks DILATION;  Surgeon: [name], MD;  [facility];  Service: Endoscopy;  Laterality: N/A;   PARTIAL KNEE ARTHROPLASTY Right 04/26/2015   Procedure: RIGHT KNEE MEDIAL UNICOMPARTMENTAL ARTHROPLASTY;  Surgeon: Gaynelle Arabian, MD;  [facility]: [facility];  Service: Orthopedics;  Laterality: Right;   POLYPECTOMY  04/27/2016   Procedure: POLYPECTOMY;  Surgeon: [name], MD;  [facility];  Service: Endoscopy;;  cecal , ascending, descending, and sigmoid polypectomies   PROSTATE BIOPSY  09/26/2011   Procedure: BIOPSY TRANSRECTAL ULTRASONIC PROSTATE (TUBP);  Surgeon: [name], MD;  [facility]: [facility];  Service: Urology;  Laterality: N/A;      TRANSTHORACIC ECHOCARDIOGRAM  01-07-2013   dr [name]   ef 60-65%,  grade 1 diastolic dysfunction/  mild AR without stenosis/  mild LAE/  mild to moderate calcificed MV annulus  without regurg. or stenosis/   UMBILICAL HERNIA REPAIR  yrs ago    Family History  Problem Relation Age of Onset   Stroke Mother    Alzheimer's disease Mother 50       probably due to head injury   Mesothelioma Father 56   Kidney cancer Father    Hyperlipidemia Sister    Heart attack Sister    Kidney cancer Sister        dx in her 56s   Heart disease Brother    Kidney cancer Brother 3       dx in his 74s   Hyperlipidemia Brother    Kidney cancer Brother        dx in his 55s   Healthy Daughter    Healthy Son    Healthy Son    Colon cancer Neg Hx     Social  History   Tobacco Use   Smoking status: Every Day    Packs/day: 0.50    Years: 42.00    Pack years: 21.00    Types: Cigarettes   Smokeless tobacco: Never  Vaping Use   Vaping Use: Never used  Substance Use Topics   Alcohol use: No    Alcohol/week: 0.0 standard drinks   Drug use: No    Medications: I have reviewed the patient's current medications. Allergies as of 11/23/2020       Reactions   Carbidopa-levodopa Other (See Comments)   hallunications   Morphine Sulfate Nausea And Vomiting   Sulfa Antibiotics Nausea And Vomiting   Sulfacetamide Sodium Nausea And Vomiting        Medication List        Accurate as of November 23, 2020 12:00 PM. If you have any questions, ask your nurse or doctor.          abiraterone acetate 250 MG tablet Commonly known as: ZYTIGA Take 2 tablets (500 mg total) by mouth daily. Take on an empty stomach 1 hour before or 2 hours after a meal   allopurinol 300 MG tablet Commonly known as: ZYLOPRIM Take 1 tablet by mouth once daily   amLODipine 5 MG tablet Commonly known as: NORVASC Take 1 tablet (5 mg total) by mouth daily.   carvedilol 25 MG tablet Commonly known as: COREG Take 1 tablet by mouth twice daily   DULoxetine 60 MG capsule Commonly known as: CYMBALTA Take 1 capsule (60 mg total) by mouth daily. Take 1 capsule by mouth once daily (Needs to be seen  before next refill)   HYDROcodone-acetaminophen 5-325 MG tablet Commonly known as: NORCO/VICODIN Take 1 tablet by mouth every 6 (six) hours as needed.   lidocaine 5 % Commonly known as: Lidoderm Place 1 patch onto the skin daily. Remove & Discard patch within 12 hours or as directed by MD   losartan 25 MG tablet Commonly known as: Cozaar Take 1 tablet (25 mg total) by mouth daily.   nystatin powder Commonly known as: MYCOSTATIN/NYSTOP Apply 1 application topically 3 (three) times daily.   pantoprazole 40 MG tablet Commonly known as: PROTONIX Take 1 tablet (40 mg total) by mouth 2 (two) times daily before a meal.   potassium chloride SA 20 MEQ tablet Commonly known as: KLOR-CON Take 1 tablet (20 mEq total) by mouth 2 (two) times daily.   pregabalin 25 MG capsule Commonly known as: LYRICA Take 1 capsule by mouth twice daily   rosuvastatin 20 MG tablet Commonly known as: CRESTOR TAKE 1 TABLET BY MOUTH ONCE DAILY -  STOP  PRAVASTATIN -- NEEDS FOLLOW UP APPOINTMENT FOR REFILLS   tamsulosin 0.4 MG Caps capsule Commonly known as: FLOMAX Take 1 capsule (0.4 mg total) by mouth daily.   traMADol 50 MG tablet Commonly known as: ULTRAM Take 1 tablet (50 mg total) by mouth daily as needed.         ROS:  A comprehensive review of systems was negative except for: Musculoskeletal: positive for back pain Neurological: positive for dizziness  Blood pressure 122/76, pulse 65, temperature 97.8 F (36.6 C), temperature source Other (Comment), resp. rate 18, height 5\' 7"  (1.702 m), weight 259 lb (117.5 kg), SpO2 95 %. Physical Exam Vitals reviewed.  Constitutional:      Appearance: Normal appearance.  HENT:     Head: Normocephalic.     Nose: Nose normal.     Mouth/Throat:     Mouth: Mucous  membranes are moist.  Eyes:     Extraocular Movements: Extraocular movements intact.  Cardiovascular:     Rate and Rhythm: Normal rate and regular rhythm.  Pulmonary:     Effort:  Pulmonary effort is normal.     Breath sounds: Normal breath sounds.  Abdominal:     General: There is no distension.     Palpations: Abdomen is soft.     Tenderness: There is no abdominal tenderness.     Comments: Unable to appreciate any hernia on exam  Musculoskeletal:        General: Normal range of motion.     Cervical back: Normal range of motion.  Skin:    General: Skin is warm.  Neurological:     General: No focal deficit present.     Mental Status: He is alert and oriented to person, place, and time.  Psychiatric:        Mood and Affect: Mood normal.        Behavior: Behavior normal.        Thought Content: Thought content normal.        Judgment: Judgment normal.    Results: Personally reviewed- small fat containing hernia right of midline at prior repair site CLINICAL DATA:  Abdominal pain, prostate cancer on chemotherapy   EXAM: CT ABDOMEN AND PELVIS WITHOUT CONTRAST   TECHNIQUE: Multidetector CT imaging of the abdomen and pelvis was performed following the standard protocol without IV contrast.   COMPARISON:  PET-CT dated 12/18/2019   FINDINGS: Lower chest: Lung bases clear.   Hepatobiliary: Unenhanced liver is unremarkable.   Layering small gallstones (series 2/image 31), without associated inflammatory changes. No intrahepatic or extrahepatic ductal dilatation.   Pancreas: Within normal limits.   Spleen: Calcified splenic granulomata.   Adrenals/Urinary Tract: Low-density thickening of the bilateral adrenal glands, suggesting adenomatous hypertrophy.   Bilateral renal cysts, measuring up to 6.0 cm in the left lower pole (series 2/image 44). Two nonobstructing right renal calculi measuring up to 2 mm in the right lower pole (series 2/image 49). No hydronephrosis.   Thick-walled bladder, although underdistended.   Stomach/Bowel: Stomach is within normal limits.   No evidence of bowel obstruction.   Normal appendix (series 2/image 54).    Vascular/Lymphatic: No evidence of abdominal aortic aneurysm. Aorto bi-iliac stent. Atherosclerotic calcifications of the abdominal aorta and branch vessels.   No suspicious abdominopelvic lymphadenopathy.   Reproductive: Fiducial markers along the left prostate.   Other: No abdominopelvic ascites.   Small fat containing left paramidline ventral hernia (series 2/image 52).   Musculoskeletal: Degenerative changes of the visualized thoracolumbar spine. No focal osseous lesions.   IMPRESSION: No evidence of bowel obstruction.  Normal appendix.   Cholelithiasis, without associated inflammatory changes.   Two nonobstructing right renal calculi measuring up to 2 mm. No hydronephrosis. Thick-walled bladder, although underdistended.   Fiducial markers along the left prostate. No findings specific for metastatic disease.     Electronically Signed   By: Julian Hy M.D.   On: 10/07/2020 23:34    Assessment & Plan:  [name] is a 75 y.o. male with recurrence of his umbilical hernia with fat that is not symptomatic at this time. Discussed reasons for repair and symptoms worsening and discussed reasons for going to the Ed if incarcerated.   Patient wants to hold on hernia repair at this time.  Will call if something changes.   All questions were answered to the satisfaction of the patient.   Ria Comment  C Cillian Gwinner 11/23/2020, 12:00 PM

## 2020-11-23 NOTE — Patient Instructions (Signed)

## 2020-11-24 DIAGNOSIS — Z716 Tobacco abuse counseling: Secondary | ICD-10-CM | POA: Diagnosis not present

## 2020-11-24 DIAGNOSIS — E872 Acidosis: Secondary | ICD-10-CM | POA: Diagnosis not present

## 2020-11-24 DIAGNOSIS — E6609 Other obesity due to excess calories: Secondary | ICD-10-CM | POA: Diagnosis not present

## 2020-11-24 DIAGNOSIS — D472 Monoclonal gammopathy: Secondary | ICD-10-CM | POA: Diagnosis not present

## 2020-11-24 DIAGNOSIS — N184 Chronic kidney disease, stage 4 (severe): Secondary | ICD-10-CM | POA: Diagnosis not present

## 2020-11-24 DIAGNOSIS — D631 Anemia in chronic kidney disease: Secondary | ICD-10-CM | POA: Diagnosis not present

## 2020-11-24 DIAGNOSIS — R809 Proteinuria, unspecified: Secondary | ICD-10-CM | POA: Diagnosis not present

## 2020-11-24 DIAGNOSIS — E278 Other specified disorders of adrenal gland: Secondary | ICD-10-CM | POA: Diagnosis not present

## 2020-11-24 DIAGNOSIS — N200 Calculus of kidney: Secondary | ICD-10-CM | POA: Diagnosis not present

## 2020-11-24 DIAGNOSIS — N189 Chronic kidney disease, unspecified: Secondary | ICD-10-CM | POA: Diagnosis not present

## 2020-11-24 DIAGNOSIS — I5032 Chronic diastolic (congestive) heart failure: Secondary | ICD-10-CM | POA: Diagnosis not present

## 2020-11-24 DIAGNOSIS — E876 Hypokalemia: Secondary | ICD-10-CM | POA: Diagnosis not present

## 2020-11-26 ENCOUNTER — Telehealth: Payer: Self-pay | Admitting: Family Medicine

## 2020-11-26 DIAGNOSIS — R296 Repeated falls: Secondary | ICD-10-CM

## 2020-11-26 DIAGNOSIS — M4726 Other spondylosis with radiculopathy, lumbar region: Secondary | ICD-10-CM

## 2020-11-26 DIAGNOSIS — R29898 Other symptoms and signs involving the musculoskeletal system: Secondary | ICD-10-CM

## 2020-11-26 NOTE — Telephone Encounter (Signed)
Advised patient that the order for the wheelchair will be faxed to [facility] today.

## 2020-11-26 NOTE — Telephone Encounter (Signed)
Printed prescription for wheelchair for the patient, can be faxed wherever he needs it be sent

## 2020-11-29 ENCOUNTER — Ambulatory Visit (INDEPENDENT_AMBULATORY_CARE_PROVIDER_SITE_OTHER): Payer: Medicare Other

## 2020-11-29 VITALS — Ht 67.0 in | Wt 259.0 lb

## 2020-11-29 DIAGNOSIS — Z0000 Encounter for general adult medical examination without abnormal findings: Secondary | ICD-10-CM

## 2020-11-29 NOTE — Progress Notes (Signed)
Subjective:   ZANE PELLECCHIA is a 75 y.o. male who presents for Medicare Annual/Subsequent preventive examination.  Virtual Visit via Telephone Note  I connected with  [name] on 11/29/20 at  9:45 AM EDT by telephone and verified that I am speaking with the correct person using two identifiers.  Location: Patient: Home Provider: [facility] Persons participating in the virtual visit: patient/Nurse Health Advisor   I discussed the limitations, risks, security and privacy concerns of performing an evaluation and management service by telephone and the availability of in person appointments. The patient expressed understanding and agreed to proceed.  Interactive audio and video telecommunications were attempted between this nurse and patient, however failed, due to patient having technical difficulties OR patient did not have access to video capability.  We continued and completed visit with audio only.  Some vital signs may be absent or patient reported.   Jacklyne Baik E Rmani Kapusta, LPN   Review of Systems     Cardiac Risk Factors include: advanced age (>49men, >76 women);sedentary lifestyle;obesity (BMI >30kg/m2);smoking/ tobacco exposure;dyslipidemia;male gender;hypertension     Objective:    Today's Vitals   11/29/20 0947 11/29/20 0948  Weight: 259 lb (117.5 kg)   Height: 5\' 7"  (1.702 m)   PainSc:  0-No pain   Body mass index is 40.57 kg/m.  Advanced Directives 11/29/2020 11/18/2020 11/12/2020 11/10/2020 10/27/2020 09/27/2020 07/26/2020  Does Patient Have a Medical Advance Directive? No No No No No No No  Would patient like information on creating a medical advance directive? No - Patient declined No - Patient declined No - Patient declined No - Patient declined No - Patient declined No - Patient declined No - Patient declined    Current Medications (verified) Outpatient Encounter Medications as of 11/29/2020  Medication Sig   abiraterone acetate (ZYTIGA) 250 MG tablet Take 2  tablets (500 mg total) by mouth daily. Take on an empty stomach 1 hour before or 2 hours after a meal   allopurinol (ZYLOPRIM) 300 MG tablet Take 1 tablet by mouth once daily   amLODipine (NORVASC) 5 MG tablet Take 1 tablet (5 mg total) by mouth daily.   carvedilol (COREG) 25 MG tablet Take 1 tablet by mouth twice daily   DULoxetine (CYMBALTA) 60 MG capsule Take 1 capsule (60 mg total) by mouth daily. Take 1 capsule by mouth once daily (Needs to be seen before next refill)   HYDROcodone-acetaminophen (NORCO/VICODIN) 5-325 MG tablet Take 1 tablet by mouth every 6 (six) hours as needed.   lidocaine (LIDODERM) 5 % Place 1 patch onto the skin daily. Remove & Discard patch within 12 hours or as directed by MD   losartan (COZAAR) 25 MG tablet Take 1 tablet (25 mg total) by mouth daily.   nystatin (MYCOSTATIN/NYSTOP) powder Apply 1 application topically 3 (three) times daily.   pantoprazole (PROTONIX) 40 MG tablet Take 1 tablet (40 mg total) by mouth 2 (two) times daily before a meal.   potassium chloride SA (KLOR-CON) 20 MEQ tablet Take 1 tablet (20 mEq total) by mouth 2 (two) times daily.   pregabalin (LYRICA) 25 MG capsule Take 1 capsule by mouth twice daily   rosuvastatin (CRESTOR) 20 MG tablet TAKE 1 TABLET BY MOUTH ONCE DAILY -  STOP  PRAVASTATIN -- NEEDS FOLLOW UP APPOINTMENT FOR REFILLS   tamsulosin (FLOMAX) 0.4 MG CAPS capsule Take 1 capsule (0.4 mg total) by mouth daily.   traMADol (ULTRAM) 50 MG tablet Take 1 tablet (50 mg total) by mouth daily  as needed. (Patient not taking: Reported on 11/29/2020)   No facility-administered encounter medications on file as of 11/29/2020.    Allergies (verified) Carbidopa-levodopa, Morphine sulfate, Sulfa antibiotics, and Sulfacetamide sodium   History: Past Medical History:  Diagnosis Date   Aortic atherosclerosis ([place])    2004  s/p  closure Penetrating atherosclerotic ulcer of infarenal aorta w/ endovascular stent graft   Arthritis    CKD (chronic  kidney disease), stage III (HCC)    Closed fracture of head of humerus 07/2017   right shoulder from fall; 09-25-2017 per pt no surgical intervention, only wore sling, intermittant pain   Coronary atherosclerosis of native coronary artery    positive myoview for ischemia 09-27-2009;  10-01-2009 per cardiac cath-- minimal nonobstructive CAD w/ 30% LAD    ED (erectile dysfunction)    Emphysema/COPD ([place])    followed by pcp--- last exacerbation 09-12-2017;  09-25-2017 per pt no cough, sob or congestion   Essential hypertension    Family history of kidney cancer    First degree heart block    Frequency of urination    GERD (gastroesophageal reflux disease)    Gout    09-25-2017  per pt stable,  last episode 2015   Heart murmur    History of adenomatous polyp of colon    History of closed head injury 2005   per pt residual resolved   History of external beam radiation therapy    completed 10/ 2015 for prostate cancer   History of gastric ulcer 2014   History of kidney stones    History of pneumothorax    spontaneous pneumo treated w/ chest tube   History of rib fracture 07/2017   from fall,  right side 6th,7th,8th   Neuropathy    OAB (overactive bladder)    OSA (obstructive sleep apnea)    Intolerant of CPAP   Prostate cancer ([place])    dx 09-26-2011 via bx-- Stage T3b,N0,  Gleason 4+4, PSA 11.2 with METs to external iliac lymph node(resolved with ADT)-- treated w/ ADT ;   05/2013 staging work-up for rising PSA , started casodex and completed radiation therapy 10/ 2015;   rising PSA post treatment   RLS (restless legs syndrome)    Ureteral neocystostomy bleed    left side   Urge urinary incontinence    Past Surgical History:  Procedure Laterality Date   ABDOMINAL AORTIC ENDOVASCULAR STENT GRAFT  09-13-2007    dr [name]  [facility]   closure penetrating atherosclerotic ulcer of infarenal aorta with stent graft   BIOPSY  03/06/2019   Procedure: BIOPSY;  Surgeon: [name], MD;   [facility];  Service: Endoscopy;;  gastric   CARDIAC CATHETERIZATION  2005   per pt normal (done in [place])   [place]  10/06/2009    dr cooper   minimal nonobstructive CAD w/30% LAD otherwise normal coronaries, LVEDP 24mmHg   CARDIOVASCULAR STRESS TEST  09/27/2009    dr Aundra Dubin   lexiscan nuclear study w/ moderate reversible inferior perfusion defect ischemia,  ef 60% (cardiac cath scheduled)   CATARACT EXTRACTION W/PHACO Right 07/12/2020   Procedure: CATARACT EXTRACTION PHACO AND INTRAOCULAR LENS PLACEMENT RIGHT EYE;  Surgeon: [name], MD;  Location: [facility];  Service: Ophthalmology;  Laterality: Right;  CDE=15.48   CHEST TUBE INSERTION  1989   "collapsed lung"due to injury   CIRCUMCISION  09/26/2011   Procedure: CIRCUMCISION ADULT;  Surgeon: [name], MD;  Location: [facility];  Service: Urology;  Laterality: N/A;   COLONOSCOPY W/ POLYPECTOMY     COLONOSCOPY WITH PROPOFOL N/A 04/27/2016    eight 4-8 mm polyps in descending colon, at splenic flexure, in ascending colon and cecum. Tubular adenomas. Surveillance in 3 years.   CYSTOSCOPY  09/26/2011   Procedure: CYSTOSCOPY;  Surgeon: [name], MD;  [facility]: [facility];  Service: Urology;  Laterality: N/A;   CYSTOSCOPY/RETROGRADE/URETEROSCOPY Left 09/27/2017   Procedure: CYSTOSCOPYLEFT /RETROGRADE/URETEROSCOPY AND RENAL WASHINGS;  Surgeon: [name], MD;  Location[facility];  Service: Urology;  Laterality: Left;   ESOPHAGOGASTRODUODENOSCOPY (EGD) WITH PROPOFOL N/A 04/27/2016   normal esophagus s/p dilation, small hiatal hernia, normal duodenum   ESOPHAGOGASTRODUODENOSCOPY (EGD) WITH PROPOFOL N/A 03/06/2019   erosive reflux esophagitis, s/p dilation, normal duodenum, abnormal gastric mucosa s/p biopsy. Hyperplastic gastric polyp. No H.pylori.    EXTRACORPOREAL SHOCK WAVE LITHOTRIPSY  yrs ago   FRACTURE SURGERY  child   Bilateral lower arms    KNEE ARTHROSCOPY Right 2013   MALONEY DILATION  N/A 04/27/2016   Procedure: Venia Minks DILATION;  Surgeon: [name], MD;  [facility];  Service: Endoscopy;  Laterality: N/A;   MALONEY DILATION N/A 03/06/2019   Procedure: Venia Minks DILATION;  Surgeon: [name], MD;  [facility];  Service: Endoscopy;  Laterality: N/A;   PARTIAL KNEE ARTHROPLASTY Right 04/26/2015   Procedure: RIGHT KNEE MEDIAL UNICOMPARTMENTAL ARTHROPLASTY;  Surgeon: Gaynelle Arabian, MD;  [facility];  Service: Orthopedics;  Laterality: Right;   POLYPECTOMY  04/27/2016   Procedure: POLYPECTOMY;  Surgeon: [name], MD;  [facility];  Service: Endoscopy;;  cecal , ascending, descending, and sigmoid polypectomies   PROSTATE BIOPSY  09/26/2011   Procedure: BIOPSY TRANSRECTAL ULTRASONIC PROSTATE (TUBP);  Surgeon: [name], MD;  [facility];  Service: Urology;  Laterality: N/A;      TRANSTHORACIC ECHOCARDIOGRAM  01-07-2013   dr [name]   ef 60-65%,  grade 1 diastolic dysfunction/  mild AR without stenosis/  mild LAE/  mild to moderate calcificed MV annulus without regurg. or stenosis/   UMBILICAL HERNIA REPAIR  yrs ago   Family History  Problem Relation Age of Onset   Stroke Mother    Alzheimer's disease Mother 38       probably due to head injury   Mesothelioma Father 53   Kidney cancer Father    Hyperlipidemia Sister    Heart attack Sister    Kidney cancer Sister        dx in her 10s   Heart disease Brother    Kidney cancer Brother 1       dx in his 68s   Hyperlipidemia Brother    Kidney cancer Brother        dx in his 42s   Healthy Daughter    Healthy Son    Healthy Son    Colon cancer Neg Hx    Social History   Socioeconomic History   Marital status: Married    Spouse name: Not on file   Number of children: 3   Years of education: 12   Highest education level: Some college, no degree  Occupational History   Occupation: Retired    Comment: Hotel manager    Comment: Part Time at [employer]   Smoking status: Every Day    Packs/day: 0.50    Years: 42.00    Pack years: 21.00    Types: Cigarettes   Smokeless tobacco:  Never  Vaping Use   Vaping Use: Never used  Substance and Sexual Activity   Alcohol use: No    Alcohol/week: 0.0 standard drinks   Drug use: No   Sexual activity: Yes    Birth control/protection: None  Other Topics Concern   Not on file  Social History Narrative   Patient is retired from the department of [employer]. He lives in a one story home with his wife. He moved from [facility] about 9 years ago. They do not have family on the [place] and although he is very outgoing he feels socially isolated. He isn't a member of any groups or churches.    Social Determinants of Health   Financial Resource Strain: Medium Risk   Difficulty of Paying Living Expenses: Somewhat hard  Food Insecurity: No Food Insecurity   Worried About Charity fundraiser in the Last Year: Never true   Ran Out of Food in the Last Year: Never true  Transportation Needs: No Transportation Needs   Lack of Transportation (Medical): No   Lack of Transportation (Non-Medical): No  Physical Activity: Inactive   Days of Exercise per Week: 0 days   Minutes of Exercise per Session: 0 min  Stress: Stress Concern Present   Feeling of Stress : To some extent  Social Connections: Moderately Isolated   Frequency of Communication with Friends and Family: More than three times a week   Frequency of Social Gatherings with Friends and Family: More than three times a week   Attends Religious Services: Never   Marine scientist or Organizations: No   Attends Music therapist: Never   Marital Status: Married    Tobacco Counseling Ready to quit: Not Answered Counseling given: Not Answered   Clinical Intake:  Pre-visit preparation completed: Yes  Pain : No/denies pain Pain Score: 0-No pain Pain Onset: More than a month ago Pain Frequency: Intermittent      BMI - recorded: 40.57 Nutritional Status: BMI > 30  Obese Diabetes: No  How often do you need to have someone help you when you read instructions, pamphlets, or other written materials from your doctor or pharmacy?: 1 - Never  Diabetic? No  Interpreter Needed?: No  Information entered by :: Kandon Hosking, LPN   Activities of Daily Living In your present state of health, do you have any difficulty performing the following activities: 11/29/2020 07/06/2020  Hearing? N N  Vision? N N  Difficulty concentrating or making decisions? N N  Walking or climbing stairs? Y Y  Comment - neuropathy  Dressing or bathing? N N  Doing errands, shopping? N N  Preparing Food and eating ? N -  Using the Toilet? N -  In the past six months, have you accidently leaked urine? Y -  Comment frequent urination and urge incontinence - seeing Urology and Nephrology -  Do you have problems with loss of bowel control? Y -  Comment urge incontinence - usually can make it to bathroom if he hurries -  Managing your Medications? N -  Managing your Finances? N -  Housekeeping or managing your Housekeeping? N -  Some recent data might be hidden    Patient Care Team: [name], [name], MD as PCP - General (Family Medicine) Satira Sark, MD as PCP - Cardiology (Cardiology) Ilean China, RN as Registered Nurse Derek Jack, MD as Medical Oncologist (Medical Oncology) [name], MD as Attending Physician (Urology) Alda Berthold, DO as Consulting  Physician (Neurology) Liana Gerold, MD as Consulting Physician (Nephrology) Virl Cagey, MD as Consulting Physician (General Surgery) [name], MD as Consulting Physician (Ophthalmology)  Indicate any recent Medical Services you may have received from other than [facility] providers in the past year (date may be approximate).     Assessment:   This is a routine wellness examination for Jayziah.  Hearing/Vision screen Hearing  Screening - Comments:: Denies hearing difficulties  Vision Screening - Comments:: Wears glasses prn reading - up to date with annual eye exams with Dr Marisa Hua  Dietary issues and exercise activities discussed: Current Exercise Habits: The patient does not participate in regular exercise at present, Exercise limited by: orthopedic condition(s);cardiac condition(s)   Goals Addressed             This Visit's Progress    Patient Stated   On track    11/28/2019 AWV Goal: Keep All Scheduled Appointments  Over the next year, patient will attend all scheduled appointments with their PCP and any specialists that they see.        Depression Screen PHQ 2/9 Scores 11/29/2020 11/01/2020 10/14/2020 09/28/2020 05/19/2020 11/28/2019 10/17/2019  PHQ - 2 Score 2 2 2 2 6 6 5   PHQ- 9 Score 9 11 12 11 17 17 15     Fall Risk Fall Risk  11/29/2020 11/23/2020 11/01/2020 10/14/2020 09/28/2020  Falls in the past year? 1 0 1 1 1   Comment - - - - -  Number falls in past yr: 1 - 1 1 1   Comment - - - - -  Injury with Fall? 1 - 1 0 0  Comment - - - - -  Risk Factor Category  - - - - -  Risk for fall due to : Impaired vision;Medication side effect;Impaired balance/gait;History of fall(s);Orthopedic patient Impaired mobility;Impaired balance/gait Impaired balance/gait;History of fall(s) History of fall(s);Impaired balance/gait History of fall(s);Impaired balance/gait  Follow up Education provided;Falls prevention discussed Falls evaluation completed Falls evaluation completed Falls evaluation completed Falls prevention discussed  Comment - - - - -    FALL RISK PREVENTION PERTAINING TO THE HOME:  Any stairs in or around the home? No  If so, are there any without handrails? No  Home free of loose throw rugs in walkways, pet beds, electrical cords, etc? Yes  Adequate lighting in your home to reduce risk of falls? Yes   ASSISTIVE DEVICES UTILIZED TO PREVENT FALLS:  Life alert? No  Use of a cane, walker or w/c? Yes   Grab bars in the bathroom? No  Shower chair or bench in shower? Yes  Elevated toilet seat or a handicapped toilet? Yes   TIMED UP AND GO:  Was the test performed? No . Telephonic visit  Cognitive Function: Normal cognitive status assessed by direct observation by this Nurse Health Advisor. No abnormalities found.    MMSE - Mini Mental State Exam 04/18/2017 12/30/2014  Orientation to time 5 5  Orientation to Place 5 5  Registration 3 3  Attention/ Calculation 5 4  Recall 3 3  Language- name 2 objects 2 2  Language- repeat 1 1  Language- follow 3 step command 3 3  Language- read & follow direction 1 1  Write a sentence 1 1  Copy design 1 1  Total score 30 29     6CIT Screen 11/28/2019 11/27/2018  What Year? 0 points 0 points  What month? 0 points 0 points  What time? 0 points 0 points  Count back from 20  0 points 0 points  Months in reverse 0 points 0 points  Repeat phrase 0 points 0 points  Total Score 0 0    Immunizations Immunization History  Administered Date(s) Administered   Fluad Quad(high Dose 65+) 02/05/2019   Influenza Whole 01/31/2007, 01/20/2008   Influenza, High Dose Seasonal PF 01/20/2017   Influenza,inj,Quad PF,6+ Mos 02/03/2013, 12/30/2014   Influenza-Unspecified 01/14/2014, 12/29/2015, 01/08/2017   Moderna Sars-Covid-2 Vaccination 07/08/2019, 08/05/2019   Pneumococcal Conjugate-13 12/30/2014    TDAP status: Due, Education has been provided regarding the importance of this vaccine. Advised may receive this vaccine at local pharmacy or Health Dept. Aware to provide a copy of the vaccination record if obtained from local pharmacy or Health Dept. Verbalized acceptance and understanding.  Flu Vaccine status: Up to date  Pneumococcal vaccine status: Due, Education has been provided regarding the importance of this vaccine. Advised may receive this vaccine at local pharmacy or Health Dept. Aware to provide a copy of the vaccination record if obtained from  local pharmacy or Health Dept. Verbalized acceptance and understanding.  Covid-19 vaccine status: Information provided on how to obtain vaccines.   Qualifies for Shingles Vaccine? Yes   Zostavax completed No   Shingrix Completed?: No.    Education has been provided regarding the importance of this vaccine. Patient has been advised to call insurance company to determine out of pocket expense if they have not yet received this vaccine. Advised may also receive vaccine at local pharmacy or Health Dept. Verbalized acceptance and understanding.  Screening Tests Health Maintenance  Topic Date Due   INFLUENZA VACCINE  01/28/2021 (Originally 11/01/2020)   Zoster Vaccines- Shingrix (1 of 2) 03/01/2021 (Originally 02/06/1965)   TETANUS/TDAP  11/29/2021 (Originally 02/06/1965)   PNA vac Low Risk Adult (2 of 2 - PPSV23) 11/29/2021 (Originally 12/30/2015)   COLONOSCOPY (Pts 45-53yrs Insurance coverage will need to be confirmed)  04/27/2026   HPV VACCINES  Aged Out   COVID-19 Vaccine  Discontinued   Hepatitis C Screening  Discontinued    Health Maintenance  There are no preventive care reminders to display for this patient.   Colorectal cancer screening: Type of screening: Colonoscopy. Completed 04/27/2016. Repeat every 10 years  Lung Cancer Screening: (Low Dose CT Chest recommended if Age 4-80 years, 30 pack-year currently smoking OR have quit w/in 15years.) does qualify.   Lung Cancer Screening Referral:  patient declines  Additional Screening:  Hepatitis C Screening: does qualify; patient declines  Vision Screening: Recommended annual ophthalmology exams for early detection of glaucoma and other disorders of the eye. Is the patient up to date with their annual eye exam?  Yes  Who is the provider or what is the name of the office in which the patient attends annual eye exams? [facility] If pt is not established with a provider, would they like to be referred to a provider to establish care? No .    Dental Screening: Recommended annual dental exams for proper oral hygiene  Community Resource Referral / Chronic Care Management: CRR required this visit?  No   CCM required this visit?  No      Plan:     I have personally reviewed and noted the following in the patient's chart:   Medical and social history Use of alcohol, tobacco or illicit drugs  Current medications and supplements including opioid prescriptions. Patient is currently taking opioid prescriptions. Information provided to patient regarding non-opioid alternatives. Patient advised to discuss non-opioid treatment plan with their provider. Functional ability and  status Nutritional status Physical activity Advanced directives List of other physicians Hospitalizations, surgeries, and ER visits in previous 12 months Vitals Screenings to include cognitive, depression, and falls Referrals and appointments  In addition, I have reviewed and discussed with patient certain preventive protocols, quality metrics, and best practice recommendations. A written personalized care plan for preventive services as well as general preventive health recommendations were provided to patient.     Sandrea Hammond, LPN   [id number]   Nurse Notes: None

## 2020-11-29 NOTE — Patient Instructions (Addendum)
[name] , Thank you for taking time to come for your Medicare Wellness Visit. I appreciate your ongoing commitment to your health goals. Please review the following plan we discussed and let me know if I can assist you in the future.   Screening recommendations/referrals: Colonoscopy: Done 04/27/2016 - Repeat in 10 years Recommended yearly ophthalmology/optometry visit for glaucoma screening and checkup Recommended yearly dental visit for hygiene and checkup  Vaccinations: Influenza vaccine: Done 2021 - Repeat annually Pneumococcal vaccine: Prevnar Done 12/30/2014, due for Pneumovax Tdap vaccine: Due (every 10 years) Shingles vaccine: Due. Shingrix discussed. Please contact your pharmacy for coverage information.     Covid-19: Done 07/08/2019 & 08/05/2019 - due for booster  Advanced directives: Advance directive discussed with you today. Even though you declined this today, please call our office should you change your mind, and we can give you the proper paperwork for you to fill out.   Conditions/risks identified: Aim for 30 minutes of exercise or brisk walking each day, drink 6-8 glasses of water and eat lots of fruits and vegetables.   Next appointment: Follow up in one year for your annual wellness visit.   Preventive Care 4 Years and Older, Male  Preventive care refers to lifestyle choices and visits with your health care provider that can promote health and wellness. What does preventive care include? A yearly physical exam. This is also called an annual well check. Dental exams once or twice a year. Routine eye exams. Ask your health care provider how often you should have your eyes checked. Personal lifestyle choices, including: Daily care of your teeth and gums. Regular physical activity. Eating a healthy diet. Avoiding tobacco and drug use. Limiting alcohol use. Practicing safe sex. Taking low doses of aspirin every day. Taking vitamin and mineral supplements as  recommended by your health care provider. What happens during an annual well check? The services and screenings done by your health care provider during your annual well check will depend on your age, overall health, lifestyle risk factors, and family history of disease. Counseling  Your health care provider may ask you questions about your: Alcohol use. Tobacco use. Drug use. Emotional well-being. Home and relationship well-being. Sexual activity. Eating habits. History of falls. Memory and ability to understand (cognition). Work and work Statistician. Screening  You may have the following tests or measurements: Height, weight, and BMI. Blood pressure. Lipid and cholesterol levels. These may be checked every 5 years, or more frequently if you are over 108 years old. Skin check. Lung cancer screening. You may have this screening every year starting at age 33 if you have a 30-pack-year history of smoking and currently smoke or have quit within the past 15 years. Fecal occult blood test (FOBT) of the stool. You may have this test every year starting at age 21. Flexible sigmoidoscopy or colonoscopy. You may have a sigmoidoscopy every 5 years or a colonoscopy every 10 years starting at age 19. Prostate cancer screening. Recommendations will vary depending on your family history and other risks. Hepatitis C blood test. Hepatitis B blood test. Sexually transmitted disease (STD) testing. Diabetes screening. This is done by checking your blood sugar (glucose) after you have not eaten for a while (fasting). You may have this done every 1-3 years. Abdominal aortic aneurysm (AAA) screening. You may need this if you are a current or former smoker. Osteoporosis. You may be screened starting at age 65 if you are at high risk. Talk with your health care provider  about your test results, treatment options, and if necessary, the need for more tests. Vaccines  Your health care provider may recommend  certain vaccines, such as: Influenza vaccine. This is recommended every year. Tetanus, diphtheria, and acellular pertussis (Tdap, Td) vaccine. You may need a Td booster every 10 years. Zoster vaccine. You may need this after age 72. Pneumococcal 13-valent conjugate (PCV13) vaccine. One dose is recommended after age 61. Pneumococcal polysaccharide (PPSV23) vaccine. One dose is recommended after age 76. Talk to your health care provider about which screenings and vaccines you need and how often you need them. This information is not intended to replace advice given to you by your health care provider. Make sure you discuss any questions you have with your health care provider. Document Released: 04/16/2015 Document Revised: 12/08/2015 Document Reviewed: 01/19/2015 Elsevier Interactive Patient Education  2017 [place] Prevention in the Home Falls can cause injuries. They can happen to people of all ages. There are many things you can do to make your home safe and to help prevent falls. What can I do on the outside of my home? Regularly fix the edges of walkways and driveways and fix any cracks. Remove anything that might make you trip as you walk through a door, such as a raised step or threshold. Trim any bushes or trees on the path to your home. Use bright outdoor lighting. Clear any walking paths of anything that might make someone trip, such as rocks or tools. Regularly check to see if handrails are loose or broken. Make sure that both sides of any steps have handrails. Any raised decks and porches should have guardrails on the edges. Have any leaves, snow, or ice cleared regularly. Use sand or salt on walking paths during winter. Clean up any spills in your garage right away. This includes oil or grease spills. What can I do in the bathroom? Use night lights. Install grab bars by the toilet and in the tub and shower. Do not use towel bars as grab bars. Use non-skid mats or  decals in the tub or shower. If you need to sit down in the shower, use a plastic, non-slip stool. Keep the floor dry. Clean up any water that spills on the floor as soon as it happens. Remove soap buildup in the tub or shower regularly. Attach bath mats securely with double-sided non-slip rug tape. Do not have throw rugs and other things on the floor that can make you trip. What can I do in the bedroom? Use night lights. Make sure that you have a light by your bed that is easy to reach. Do not use any sheets or blankets that are too big for your bed. They should not hang down onto the floor. Have a firm chair that has side arms. You can use this for support while you get dressed. Do not have throw rugs and other things on the floor that can make you trip. What can I do in the kitchen? Clean up any spills right away. Avoid walking on wet floors. Keep items that you use a lot in easy-to-reach places. If you need to reach something above you, use a strong step stool that has a grab bar. Keep electrical cords out of the way. Do not use floor polish or wax that makes floors slippery. If you must use wax, use non-skid floor wax. Do not have throw rugs and other things on the floor that can make you trip. What can I do  with my stairs? Do not leave any items on the stairs. Make sure that there are handrails on both sides of the stairs and use them. Fix handrails that are broken or loose. Make sure that handrails are as long as the stairways. Check any carpeting to make sure that it is firmly attached to the stairs. Fix any carpet that is loose or worn. Avoid having throw rugs at the top or bottom of the stairs. If you do have throw rugs, attach them to the floor with carpet tape. Make sure that you have a light switch at the top of the stairs and the bottom of the stairs. If you do not have them, ask someone to add them for you. What else can I do to help prevent falls? Wear shoes that: Do not  have high heels. Have rubber bottoms. Are comfortable and fit you well. Are closed at the toe. Do not wear sandals. If you use a stepladder: Make sure that it is fully opened. Do not climb a closed stepladder. Make sure that both sides of the stepladder are locked into place. Ask someone to hold it for you, if possible. Clearly mark and make sure that you can see: Any grab bars or handrails. First and last steps. Where the edge of each step is. Use tools that help you move around (mobility aids) if they are needed. These include: Canes. Walkers. Scooters. Crutches. Turn on the lights when you go into a dark area. Replace any light bulbs as soon as they burn out. Set up your furniture so you have a clear path. Avoid moving your furniture around. If any of your floors are uneven, fix them. If there are any pets around you, be aware of where they are. Review your medicines with your doctor. Some medicines can make you feel dizzy. This can increase your chance of falling. Ask your doctor what other things that you can do to help prevent falls. This information is not intended to replace advice given to you by your health care provider. Make sure you discuss any questions you have with your health care provider. Document Released: 01/14/2009 Document Revised: 08/26/2015 Document Reviewed: 04/24/2014 Elsevier Interactive Patient Education  2017 [employer]   Managing Pain Without Opioids Opioids are strong medicines used to treat moderate to severe pain. For some people, especially those who have long-term (chronic) pain, opioids may not be the best choice for pain management due to: Side effects like nausea, constipation, and sleepiness. The risk of addiction (opioid use disorder). The longer you take opioids, the greater your risk of addiction. Pain that lasts for more than 3 months is called chronic pain. Managing chronic pain usually requires more than one approach and is often  provided by a team of health care providers working together (multidisciplinary approach). Pain management may be done at a pain management center or pain clinic. How to manage pain without the use of opioids Use non-opioid medicines Non-opioid medicines for pain may include: Over-the-counter or prescription non-steroidal anti-inflammatory drugs (NSAIDs). These may be the first medicines used for pain. They work well for muscle and bone pain, and they reduce swelling. Acetaminophen. This over-the-counter medicine may work well for milder pain but not swelling. Antidepressants. These may be used to treat chronic pain. A certain type of antidepressant (tricyclics) is often used. These medicines are given in lower doses for pain than when used for depression. Anticonvulsants. These are usually used to treat seizures but may also reduce nerve (neuropathic)  pain. Muscle relaxants. These relieve pain caused by sudden muscle tightening (spasms). You may also use a pain medicine that is applied to the skin as a patch, cream, or gel (topical analgesic), such as a numbing medicine. These may cause fewer side effects than medicines taken by mouth. Do certain therapies as directed Some therapies can help with pain management. They include: Physical therapy. You will do exercises to gain strength and flexibility. A physical therapist may teach you exercises to move and stretch parts of your body that are weak, stiff, or painful. You can learn these exercises at physical therapy visits and practice them at home. Physical therapy may also involve: Massage. Heat wraps or applying heat or cold to affected areas. Electrical signals that interrupt pain signals (transcutaneous electrical nerve stimulation, TENS). Weak lasers that reduce pain and swelling (low-level laser therapy). Signals from your body that help you learn to regulate pain (biofeedback). Occupational therapy. This helps you to learn ways to function  at home and work with less pain. Recreational therapy. This involves trying new activities or hobbies, such as a physical activity or drawing. Mental health therapy, including: Cognitive behavioral therapy (CBT). This helps you learn coping skills for dealing with pain. Acceptance and commitment therapy (ACT) to change the way you think and react to pain. Relaxation therapies, including muscle relaxation exercises and mindfulness-based stress reduction. Pain management counseling. This may be individual, family, or group counseling.  Receive medical treatments Medical treatments for pain management include: Nerve block injections. These may include a pain blocker and anti-inflammatory medicines. You may have injections: Near the spine to relieve chronic back or neck pain. Into joints to relieve back or joint pain. Into nerve areas that supply a painful area to relieve body pain. Into muscles (trigger point injections) to relieve some painful muscle conditions. A medical device placed near your spine to help block pain signals and relieve nerve pain or chronic back pain (spinal cord stimulation device). Acupuncture. Follow these instructions at home Medicines Take over-the-counter and prescription medicines only as told by your health care provider. If you are taking pain medicine, ask your health care providers about possible side effects to watch out for. Do not drive or use heavy machinery while taking prescription opioid pain medicine. Lifestyle  Do not use drugs or alcohol to reduce pain. If you drink alcohol, limit how much you have to: 0-1 drink a day for women who are not pregnant. 0-2 drinks a day for men. Know how much alcohol is in a drink. In the [place] one drink equals one 12 oz bottle of beer (355 mL), one 5 oz glass of wine (148 mL), or one 1 oz glass of hard liquor (44 mL). Do not use any products that contain nicotine or tobacco. These products include cigarettes, chewing  tobacco, and vaping devices, such as e-cigarettes. If you need help quitting, ask your health care provider. Eat a healthy diet and maintain a healthy weight. Poor diet and excess weight may make pain worse. Eat foods that are high in fiber. These include fresh fruits and vegetables, whole grains, and beans. Limit foods that are high in fat and processed sugars, such as fried and sweet foods. Exercise regularly. Exercise lowers stress and may help relieve pain. Ask your health care provider what activities and exercises are safe for you. If your health care provider approves, join an exercise class that combines movement and stress reduction. Examples include yoga and tai chi. Get enough sleep. Lack of  sleep may make pain worse. Lower stress as much as possible. Practice stress reduction techniques as told by your therapist. General instructions Work with all your pain management providers to find the treatments that work best for you. You are an important member of your pain management team. There are many things you can do to reduce pain on your own. Consider joining an online or in-person support group for people who have chronic pain. Keep all follow-up visits. This is important. Where to find more information You can find more information about managing pain without opioids from: American Academy of Pain Medicine: painmed.[place] for Chronic Pain: instituteforchronicpain.org American Chronic Pain Association: theacpa.org Contact a health care provider if: You have side effects from pain medicine. Your pain gets worse or does not get better with treatments or home therapy. You are struggling with anxiety or depression. Summary Many types of pain can be managed without opioids. Chronic pain may respond better to pain management without opioids. Pain is best managed when you and a team of health care providers work together. Pain management without opioids may include non-opioid  medicines, medical treatments, physical therapy, mental health therapy, and lifestyle changes. Tell your health care providers if your pain gets worse or is not being managed well enough. This information is not intended to replace advice given to you by your health care provider. Make sure you discuss any questions you have with your health care provider. Document Revised: 06/30/2020 Document Reviewed: 06/30/2020 Elsevier Patient Education  [place].

## 2020-11-30 ENCOUNTER — Other Ambulatory Visit (HOSPITAL_COMMUNITY): Payer: Self-pay | Admitting: *Deleted

## 2020-12-01 ENCOUNTER — Other Ambulatory Visit (HOSPITAL_COMMUNITY): Payer: Self-pay | Admitting: Nephrology

## 2020-12-01 DIAGNOSIS — N200 Calculus of kidney: Secondary | ICD-10-CM

## 2020-12-01 DIAGNOSIS — D472 Monoclonal gammopathy: Secondary | ICD-10-CM

## 2020-12-01 DIAGNOSIS — E6609 Other obesity due to excess calories: Secondary | ICD-10-CM

## 2020-12-01 DIAGNOSIS — E872 Acidosis, unspecified: Secondary | ICD-10-CM

## 2020-12-01 DIAGNOSIS — N184 Chronic kidney disease, stage 4 (severe): Secondary | ICD-10-CM

## 2020-12-01 DIAGNOSIS — Z716 Tobacco abuse counseling: Secondary | ICD-10-CM

## 2020-12-01 DIAGNOSIS — D631 Anemia in chronic kidney disease: Secondary | ICD-10-CM

## 2020-12-02 ENCOUNTER — Other Ambulatory Visit: Payer: Self-pay

## 2020-12-02 ENCOUNTER — Inpatient Hospital Stay (HOSPITAL_COMMUNITY): Payer: Medicare Other | Attending: Hematology

## 2020-12-02 DIAGNOSIS — Z923 Personal history of irradiation: Secondary | ICD-10-CM | POA: Diagnosis not present

## 2020-12-02 DIAGNOSIS — G629 Polyneuropathy, unspecified: Secondary | ICD-10-CM | POA: Insufficient documentation

## 2020-12-02 DIAGNOSIS — N3281 Overactive bladder: Secondary | ICD-10-CM | POA: Insufficient documentation

## 2020-12-02 DIAGNOSIS — Z79899 Other long term (current) drug therapy: Secondary | ICD-10-CM | POA: Insufficient documentation

## 2020-12-02 DIAGNOSIS — I129 Hypertensive chronic kidney disease with stage 1 through stage 4 chronic kidney disease, or unspecified chronic kidney disease: Secondary | ICD-10-CM | POA: Insufficient documentation

## 2020-12-02 DIAGNOSIS — C61 Malignant neoplasm of prostate: Secondary | ICD-10-CM | POA: Insufficient documentation

## 2020-12-02 DIAGNOSIS — N183 Chronic kidney disease, stage 3 unspecified: Secondary | ICD-10-CM | POA: Diagnosis not present

## 2020-12-02 DIAGNOSIS — F17210 Nicotine dependence, cigarettes, uncomplicated: Secondary | ICD-10-CM | POA: Diagnosis not present

## 2020-12-02 DIAGNOSIS — N184 Chronic kidney disease, stage 4 (severe): Secondary | ICD-10-CM

## 2020-12-02 LAB — CBC WITH DIFFERENTIAL/PLATELET
Abs Immature Granulocytes: 0.05 10*3/uL (ref 0.00–0.07)
Basophils Absolute: 0 10*3/uL (ref 0.0–0.1)
Basophils Relative: 1 %
Eosinophils Absolute: 0.4 10*3/uL (ref 0.0–0.5)
Eosinophils Relative: 6 %
HCT: 32.6 % — ABNORMAL LOW (ref 39.0–52.0)
Hemoglobin: 10.9 g/dL — ABNORMAL LOW (ref 13.0–17.0)
Immature Granulocytes: 1 %
Lymphocytes Relative: 8 %
Lymphs Abs: 0.6 10*3/uL — ABNORMAL LOW (ref 0.7–4.0)
MCH: 31.3 pg (ref 26.0–34.0)
MCHC: 33.4 g/dL (ref 30.0–36.0)
MCV: 93.7 fL (ref 80.0–100.0)
Monocytes Absolute: 0.3 10*3/uL (ref 0.1–1.0)
Monocytes Relative: 4 %
Neutro Abs: 6 10*3/uL (ref 1.7–7.7)
Neutrophils Relative %: 80 %
Platelets: 206 10*3/uL (ref 150–400)
RBC: 3.48 MIL/uL — ABNORMAL LOW (ref 4.22–5.81)
RDW: 14.3 % (ref 11.5–15.5)
WBC: 7.4 10*3/uL (ref 4.0–10.5)
nRBC: 0 % (ref 0.0–0.2)

## 2020-12-02 LAB — COMPREHENSIVE METABOLIC PANEL
ALT: 11 U/L (ref 0–44)
AST: 12 U/L — ABNORMAL LOW (ref 15–41)
Albumin: 3.6 g/dL (ref 3.5–5.0)
Alkaline Phosphatase: 89 U/L (ref 38–126)
Anion gap: 6 (ref 5–15)
BUN: 36 mg/dL — ABNORMAL HIGH (ref 8–23)
CO2: 23 mmol/L (ref 22–32)
Calcium: 8.3 mg/dL — ABNORMAL LOW (ref 8.9–10.3)
Chloride: 107 mmol/L (ref 98–111)
Creatinine, Ser: 3.18 mg/dL — ABNORMAL HIGH (ref 0.61–1.24)
GFR, Estimated: 20 mL/min — ABNORMAL LOW (ref >60)
Glucose, Bld: 130 mg/dL — ABNORMAL HIGH (ref 70–99)
Potassium: 4.5 mmol/L (ref 3.5–5.1)
Sodium: 136 mmol/L (ref 135–145)
Total Bilirubin: 0.5 mg/dL (ref 0.3–1.2)
Total Protein: 7 g/dL (ref 6.5–8.1)

## 2020-12-02 LAB — PSA: Prostatic Specific Antigen: 0.81 ng/mL (ref 0.00–4.00)

## 2020-12-03 LAB — TESTOSTERONE: Testosterone: <3 ng/dL — ABNORMAL LOW (ref 264–916)

## 2020-12-08 ENCOUNTER — Ambulatory Visit (INDEPENDENT_AMBULATORY_CARE_PROVIDER_SITE_OTHER): Payer: Medicare Other | Admitting: Family Medicine

## 2020-12-08 ENCOUNTER — Other Ambulatory Visit: Payer: Self-pay

## 2020-12-08 ENCOUNTER — Encounter: Payer: Self-pay | Admitting: Family Medicine

## 2020-12-08 VITALS — BP 141/86 | HR 68 | Ht 67.0 in | Wt 250.5 lb

## 2020-12-08 DIAGNOSIS — E782 Mixed hyperlipidemia: Secondary | ICD-10-CM | POA: Diagnosis not present

## 2020-12-08 DIAGNOSIS — B372 Candidiasis of skin and nail: Secondary | ICD-10-CM | POA: Diagnosis not present

## 2020-12-08 DIAGNOSIS — I10 Essential (primary) hypertension: Secondary | ICD-10-CM

## 2020-12-08 DIAGNOSIS — R7303 Prediabetes: Secondary | ICD-10-CM

## 2020-12-08 MED ORDER — FLUCONAZOLE 150 MG PO TABS: 150.0000 mg | ORAL_TABLET | ORAL | 0 refills | Status: DC

## 2020-12-08 NOTE — Progress Notes (Signed)
[name], [place] [address]   CLINIC:  Medical Oncology/Hematology  PCP:  [name], [name], MD [place] / [place] [id number] [phone]   REASON FOR VISIT:  Follow-up for recurrent prostate cancer  PRIOR THERAPY: Enzalutamide  NGS Results: not done  CURRENT THERAPY: Abiraterone and prednisone on hold  BRIEF ONCOLOGIC HISTORY:  Oncology History   No history exists.    CANCER STAGING: Cancer Staging Prostate cancer [facility]) Staging form: Prostate, AJCC 7th Edition - Clinical: Stage IV (pT3b, N1, M0) - Signed by Wyatt Portela, MD on 07/09/2013   INTERVAL HISTORY:  [name], a 75 y.o. male, returns for routine follow-up of his recurrent prostate cancer. [name] was last seen on 08/20/2020.   Today he reports feeling well. He denies any new pains or generalized weakness. He reports a fall 1 week ago, and he reports back pain. He has not taken Zytiga in 2 months. He is currently drinking 40 ounces of water daily.   REVIEW OF SYSTEMS:  Review of Systems  Constitutional:  Positive for fatigue (20%). Negative for appetite change (60%).  Musculoskeletal:  Positive for back pain.  All other systems reviewed and are negative.  PAST MEDICAL/SURGICAL HISTORY:  Past Medical History:  Diagnosis Date   Aortic atherosclerosis ([place])    2004  s/p  closure Penetrating atherosclerotic ulcer of infarenal aorta w/ endovascular stent graft   Arthritis    CKD (chronic kidney disease), stage III (HCC)    Closed fracture of head of humerus 07/2017   right shoulder from fall; 09-25-2017 per pt no surgical intervention, only wore sling, intermittant pain   Coronary atherosclerosis of native coronary artery    positive myoview for ischemia 09-27-2009;  10-01-2009 per cardiac cath-- minimal nonobstructive CAD w/ 30% LAD    ED (erectile dysfunction)    Emphysema/COPD ([place])    followed by pcp--- last exacerbation 09-12-2017;   09-25-2017 per pt no cough, sob or congestion   Essential hypertension    Family history of kidney cancer    First degree heart block    Frequency of urination    GERD (gastroesophageal reflux disease)    Gout    09-25-2017  per pt stable,  last episode 2015   Heart murmur    History of adenomatous polyp of colon    History of closed head injury 2005   per pt residual resolved   History of external beam radiation therapy    completed 10/ 2015 for prostate cancer   History of gastric ulcer 2014   History of kidney stones    History of pneumothorax    spontaneous pneumo treated w/ chest tube   History of rib fracture 07/2017   from fall,  right side 6th,7th,8th   Neuropathy    OAB (overactive bladder)    OSA (obstructive sleep apnea)    Intolerant of CPAP   Prostate cancer ([place])    dx 09-26-2011 via bx-- Stage T3b,N0,  Gleason 4+4, PSA 11.2 with METs to external iliac lymph node(resolved with ADT)-- treated w/ ADT ;   05/2013 staging work-up for rising PSA , started casodex and completed radiation therapy 10/ 2015;   rising PSA post treatment   RLS (restless legs syndrome)    Ureteral neocystostomy bleed    left side   Urge urinary incontinence    Past Surgical History:  Procedure Laterality Date   ABDOMINAL AORTIC ENDOVASCULAR STENT GRAFT  09-13-2007  dr [name]  [facility]   closure penetrating atherosclerotic ulcer of infarenal aorta with stent graft   BIOPSY  03/06/2019   Procedure: BIOPSY;  Surgeon: [name], MD;  [facility];  Service: Endoscopy;;  gastric   CARDIAC CATHETERIZATION  2005   per pt normal (done in [place])   [place]  10/06/2009    dr cooper   minimal nonobstructive CAD w/30% LAD otherwise normal coronaries, LVEDP 22mmHg   CARDIOVASCULAR STRESS TEST  09/27/2009    dr Aundra Dubin   lexiscan nuclear study w/ moderate reversible inferior perfusion defect ischemia,  ef 60% (cardiac cath scheduled)   CATARACT EXTRACTION W/PHACO Right  07/12/2020   Procedure: CATARACT EXTRACTION PHACO AND INTRAOCULAR LENS PLACEMENT RIGHT EYE;  Surgeon: [name], MD;  Location: [facility] OR[facility];  Service: Ophthalmology;  Laterality: Right;  CDE=15.48   CHEST TUBE INSERTION  1989   "collapsed lung"due to injury   CIRCUMCISION  09/26/2011   Procedure: CIRCUMCISION ADULT;  Surgeon: [name], MD;  Location: [facility];  Service: Urology;  Laterality: N/A;   COLONOSCOPY W/ POLYPECTOMY     COLONOSCOPY WITH PROPOFOL N/A 04/27/2016    eight 4-8 mm polyps in descending colon, at splenic flexure, in ascending colon and cecum. Tubular adenomas. Surveillance in 3 years.   CYSTOSCOPY  09/26/2011   Procedure: CYSTOSCOPY;  Surgeon: [name], MD;  [facility]: [facility];  Service: Urology;  Laterality: N/A;   CYSTOSCOPY/RETROGRADE/URETEROSCOPY Left 09/27/2017   Procedure: CYSTOSCOPYLEFT /RETROGRADE/URETEROSCOPY AND RENAL WASHINGS;  Surgeon: [name], MD;  Location[facility];  Service: Urology;  Laterality: Left;   ESOPHAGOGASTRODUODENOSCOPY (EGD) WITH PROPOFOL N/A 04/27/2016   normal esophagus s/p dilation, small hiatal hernia, normal duodenum   ESOPHAGOGASTRODUODENOSCOPY (EGD) WITH PROPOFOL N/A 03/06/2019   erosive reflux esophagitis, s/p dilation, normal duodenum, abnormal gastric mucosa s/p biopsy. Hyperplastic gastric polyp. No H.pylori.    EXTRACORPOREAL SHOCK WAVE LITHOTRIPSY  yrs ago   FRACTURE SURGERY  child   Bilateral lower arms    KNEE ARTHROSCOPY Right 2013   MALONEY DILATION N/A 04/27/2016   Procedure: Venia Minks DILATION;  Surgeon: [name], MD;  [facility];  Service: Endoscopy;  Laterality: N/A;   MALONEY DILATION N/A 03/06/2019   Procedure: Venia Minks DILATION;  Surgeon: [name], MD;  [facility];  Service: Endoscopy;  Laterality: N/A;   PARTIAL KNEE ARTHROPLASTY Right 04/26/2015   Procedure: RIGHT KNEE MEDIAL UNICOMPARTMENTAL ARTHROPLASTY;  Surgeon: Gaynelle Arabian, MD;  [facility];  Service:  Orthopedics;  Laterality: Right;   POLYPECTOMY  04/27/2016   Procedure: POLYPECTOMY;  Surgeon: [name], MD;  [facility];  Service: Endoscopy;;  cecal , ascending, descending, and sigmoid polypectomies   PROSTATE BIOPSY  09/26/2011   Procedure: BIOPSY TRANSRECTAL ULTRASONIC PROSTATE (TUBP);  Surgeon: [name], MD;  [facility];  Service: Urology;  Laterality: N/A;      TRANSTHORACIC ECHOCARDIOGRAM  01-07-2013   dr [name]   ef 60-65%,  grade 1 diastolic dysfunction/  mild AR without stenosis/  mild LAE/  mild to moderate calcificed MV annulus without regurg. or stenosis/   UMBILICAL HERNIA REPAIR  yrs ago    SOCIAL HISTORY:  Social History   Socioeconomic History   Marital status: Married    Spouse name: Not on file   Number of children: 3   Years of education: 12   Highest education level: Some college, no degree  Occupational History   Occupation:  Retired    Comment: Hotel manager    Comment: Part Time at [employer] Use   Smoking status: Every Day    Packs/day: 0.50    Years: 42.00    Pack years: 21.00    Types: Cigarettes   Smokeless tobacco: Never  Vaping Use   Vaping Use: Never used  Substance and Sexual Activity   Alcohol use: No    Alcohol/week: 0.0 standard drinks   Drug use: No   Sexual activity: Yes    Birth control/protection: None  Other Topics Concern   Not on file  Social History Narrative   Patient is retired from the department of [employer]. He lives in a one story home with his wife. He moved from [facility] [place] about 9 years ago. They do not have family on the [place] and although he is very outgoing he feels socially isolated. He isn't a member of any groups or churches.    Social Determinants of Health   Financial Resource Strain: Medium Risk   Difficulty of Paying Living Expenses: Somewhat hard  Food Insecurity: No Food Insecurity   Worried About Charity fundraiser in the Last Year: Never true   Ran Out of  Food in the Last Year: Never true  Transportation Needs: No Transportation Needs   Lack of Transportation (Medical): No   Lack of Transportation (Non-Medical): No  Physical Activity: Inactive   Days of Exercise per Week: 0 days   Minutes of Exercise per Session: 0 min  Stress: Stress Concern Present   Feeling of Stress : To some extent  Social Connections: Moderately Isolated   Frequency of Communication with Friends and Family: More than three times a week   Frequency of Social Gatherings with Friends and Family: More than three times a week   Attends Religious Services: Never   Marine scientist or Organizations: No   Attends Music therapist: Never   Marital Status: Married  Human resources officer Violence: Not At Risk   Fear of Current or Ex-Partner: No   Emotionally Abused: No   Physically Abused: No   Sexually Abused: No    FAMILY HISTORY:  Family History  Problem Relation Age of Onset   Stroke Mother    Alzheimer's disease Mother 74       probably due to head injury   Mesothelioma Father 65   Kidney cancer Father    Hyperlipidemia Sister    Heart attack Sister    Kidney cancer Sister        dx in her 69s   Heart disease Brother    Kidney cancer Brother 7       dx in his 16s   Hyperlipidemia Brother    Kidney cancer Brother        dx in his 75s   Healthy Daughter    Healthy Son    Healthy Son    Colon cancer Neg Hx     CURRENT MEDICATIONS:  Current Outpatient Medications  Medication Sig Dispense Refill   allopurinol (ZYLOPRIM) 300 MG tablet Take 1 tablet by mouth once daily 90 tablet 0   amLODipine (NORVASC) 5 MG tablet Take 1 tablet (5 mg total) by mouth daily. 90 tablet 3   carvedilol (COREG) 25 MG tablet Take 1 tablet by mouth twice daily 180 tablet 0   DULoxetine (CYMBALTA) 60 MG capsule Take 1 capsule (60 mg total) by mouth daily. Take 1 capsule by mouth once daily (Needs to  be seen before next refill) 90 capsule 3   fluconazole (DIFLUCAN)  150 MG tablet Take 1 tablet (150 mg total) by mouth once a week. 3 tablet 0   HYDROcodone-acetaminophen (NORCO/VICODIN) 5-325 MG tablet Take 1 tablet by mouth every 6 (six) hours as needed. 20 tablet 0   lidocaine (LIDODERM) 5 % Place 1 patch onto the skin daily. Remove & Discard patch within 12 hours or as directed by MD 30 patch 0   losartan (COZAAR) 25 MG tablet Take 1 tablet (25 mg total) by mouth daily. 90 tablet 3   nystatin (MYCOSTATIN/NYSTOP) powder Apply 1 application topically 3 (three) times daily. 15 g 0   pantoprazole (PROTONIX) 40 MG tablet Take 1 tablet (40 mg total) by mouth 2 (two) times daily before a meal. 180 tablet 0   potassium chloride SA (KLOR-CON) 20 MEQ tablet Take 1 tablet (20 mEq total) by mouth 2 (two) times daily. 60 tablet 3   pregabalin (LYRICA) 25 MG capsule Take 1 capsule by mouth twice daily 60 capsule 3   rosuvastatin (CRESTOR) 20 MG tablet TAKE 1 TABLET BY MOUTH ONCE DAILY -  STOP  PRAVASTATIN -- NEEDS FOLLOW UP APPOINTMENT FOR REFILLS 15 tablet 0   tamsulosin (FLOMAX) 0.4 MG CAPS capsule Take 1 capsule (0.4 mg total) by mouth daily. 90 capsule 3   traMADol (ULTRAM) 50 MG tablet Take 1 tablet (50 mg total) by mouth daily as needed. 30 tablet 1   abiraterone acetate (ZYTIGA) 250 MG tablet Take 2 tablets (500 mg total) by mouth daily. Take on an empty stomach 1 hour before or 2 hours after a meal (Patient not taking: Reported on 12/09/2020) 120 tablet 3   No current facility-administered medications for this visit.    ALLERGIES:  Allergies  Allergen Reactions   Carbidopa-Levodopa Other (See Comments)    hallunications   Morphine Sulfate Nausea And Vomiting   Sulfa Antibiotics Nausea And Vomiting   Sulfacetamide Sodium Nausea And Vomiting    PHYSICAL EXAM:  Performance status (ECOG): 1 - Symptomatic but completely ambulatory  Vitals:   12/09/20 1312  BP: 129/80  Pulse: 62  Resp: 16  Temp: (!) 96.6 F (35.9 C)  SpO2: 98%   Wt Readings from Last 3  Encounters:  12/09/20 250 lb 12.8 oz (113.8 kg)  12/08/20 250 lb 8 oz (113.6 kg)  11/29/20 259 lb (117.5 kg)   Physical Exam Vitals reviewed.  Constitutional:      Appearance: Normal appearance.     Comments: In wheelchair  Cardiovascular:     Rate and Rhythm: Normal rate and regular rhythm.     Pulses: Normal pulses.     Heart sounds: Normal heart sounds.  Pulmonary:     Effort: Pulmonary effort is normal.     Breath sounds: Normal breath sounds.  Neurological:     General: No focal deficit present.     Mental Status: He is alert and oriented to person, place, and time.  Psychiatric:        Mood and Affect: Mood normal.        Behavior: Behavior normal.     LABORATORY DATA:  I have reviewed the labs as listed.  CBC Latest Ref Rng & Units 12/02/2020 11/10/2020 10/26/2020  WBC 4.0 - 10.5 K/uL 7.4 4.1 5.1  Hemoglobin 13.0 - 17.0 g/dL 10.9(L) 10.4(L) 11.3(L)  Hematocrit 39.0 - 52.0 % 32.6(L) 32.0(L) 35.0(L)  Platelets 150 - 400 K/uL 206 184 178   CMP Latest Ref Rng &  Units 12/02/2020 11/10/2020 10/26/2020  Glucose 70 - 99 mg/dL 130(H) 122(H) 122(H)  BUN 8 - 23 mg/dL 36(H) 22 35(H)  Creatinine 0.61 - 1.24 mg/dL 3.18(H) 2.82(H) 2.45(H)  Sodium 135 - 145 mmol/L 136 135 136  Potassium 3.5 - 5.1 mmol/L 4.5 3.2(L) 3.5  Chloride 98 - 111 mmol/L 107 109 107  CO2 22 - 32 mmol/L 23 20(L) 22  Calcium 8.9 - 10.3 mg/dL 8.3(L) 8.0(L) 8.1(L)  Total Protein 6.5 - 8.1 g/dL 7.0 6.3(L) 6.6  Total Bilirubin 0.3 - 1.2 mg/dL 0.5 0.6 0.6  Alkaline Phos 38 - 126 U/L 89 69 62  AST 15 - 41 U/L 12(L) 13(L) 12(L)  ALT 0 - 44 U/L 11 10 14     DIAGNOSTIC IMAGING:  I have independently reviewed the scans and discussed with the patient. DG Ribs Unilateral W/Chest Right  Result Date: 11/18/2020 CLINICAL DATA:  Fall, pain and rib cage in RIGHT knee. EXAM: RIGHT RIBS AND CHEST - 3+ VIEW COMPARISON:  June 11, 2018. FINDINGS: Trachea is midline. Cardiomediastinal contours and hilar soft structures are stable  accounting for slight rotation to the RIGHT on the current exam. No lobar consolidation. No sign of pleural effusion or visible pneumothorax. No acute skeletal process on limited assessment with marked glenohumeral degenerative changes on the RIGHT. IMPRESSION: No acute cardiopulmonary disease. Electronically Signed   By: Zetta Bills M.D.   On: 11/18/2020 14:23   DG Knee Complete 4 Views Right  Result Date: 11/18/2020 CLINICAL DATA:  Fall EXAM: RIGHT KNEE - COMPLETE 4+ VIEW COMPARISON:  X-ray dated July 23, 2015 FINDINGS: No acute fracture or dislocation. Prior knee arthroplasty of the medial compartment. Hardware is intact with no evidence of periprosthetic fracture or lucency. Unchanged moderate joint effusion. Soft tissues are unremarkable. IMPRESSION: No acute osseous abnormality. Electronically Signed   By: Yetta Glassman M.D.   On: 11/18/2020 14:21     ASSESSMENT:  1.  Castration resistant prostate cancer: -T3 N1 M0 Gleason 4+4 =8 prostate cancer with metastatic adenopathy in the right iliac chain diagnosed on 09/26/2011, PSA 11.2, treated with ADT only and PSA nadired at around 0.12 in February 2014.  Subsequently slowly rising PSA to 0.32 in October 2014 and 0.66 in February 2015.  Staging scans in March 2015 -.  He was treated with salvage radiation therapy with bicalutamide. ADT was continued until March 2018.  Staging work-up at diagnosis was negative for bone mets. -PSA 0.81 (09/24/2017), 1.6 (9/19), 1.3 (03/2019) 3.2 (07/18/2019), 4.2 (11/14/2019), testosterone less than 3. -Fluciclovine PET scan on 08/07/2019 showed asymmetric activity in the right prostate gland representing residual carcinoma with no evidence of metastatic adenopathy and no skeletal lesions. -Last Lupron injection 45 mg on 07/18/2019. -Fluciclovine PET scan on 12/18/2019 shows persistent hypermetabolic involving the prostate, slightly increased on the left.  Similar left and slight increase in right inguinal nodal low-level  hypermetabolism, favored to be reactive but warranting follow-up.  Increased hypermetabolic within the enlarging thoracic lymph nodes, cannot exclude atypical distribution of nodal metastasis. -Enzalutamide started around 01/07/2020, discontinued on 08/20/2020 due to memory problems. - Abiraterone with prednisone started on 08/20/2020.  Held on 10/06/2020.  Restarted back on 12/09/2020 at 500 mg daily.   2.  Social/family history: -Half pack per day current active smoker for 60 years. -Father and 2 brothers had kidney cancer.   PLAN:  1.  Metastatic castration resistant prostate cancer: - He was started on abiraterone and prednisone around 08/20/2020. - He developed diarrhea and acute kidney  injury.  Zytiga was discontinued around 10/06/2020. - His PSA has continued to steadily go up from 0.3-0.81 today. - His kidney function is continuing to get worse.  Latest creatinine is 3.18.  LFTs are normal. - He reported of falls couple of times in the last 2 months.  He reports that his legs move faster than his body making him fall.  He has chronic back problems with pains.  He uses a walker and a cane to ambulate. - Recommend restarting Abiraterone at a lower dose of 100 mg daily on an empty stomach along with prednisone 5 mg daily.   2.  Neuropathy: - Continue Lyrica 25 mg twice daily.   3.  Overactive bladder: - Continue with tamsulosin.  He has failed Myrbetriq and [place]   4.  CKD: - Creatinine is 3.18, up from 2.82 in August and 2.45 in July. - Continue follow-up with Dr. Theador Hawthorne who is planning to do 24-hour urine and renal ultrasound.   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 - We will consider checking plasma fractionated metanephrines. - Consider imaging with a CT or PET scan if metanephrines are elevated.   Orders placed this encounter:  No orders of the defined types were  placed in this encounter.    Derek Jack, MD [place] [phone]   I, Thana Ates, am acting as a scribe for Dr. Derek Jack.  I, Derek Jack MD, have reviewed the above documentation for accuracy and completeness, and I agree with the above.

## 2020-12-08 NOTE — Progress Notes (Signed)
BP (!) 141/86   Pulse 68   Ht 5\' 7"  (1.702 m)   Wt 250 lb 8 oz (113.6 kg)   SpO2 97%   BMI 39.23 kg/m    Subjective:   Patient ID: [name], male    DOB: 1945/12/10, 75 y.o.   MRN: [id number]  HPI: [name] is a 75 y.o. male presenting on 12/08/2020 for Medical Management of Chronic Issues and Hypertension   HPI Prediabetes  patient comes in today for recheck of his diabetes. Patient has been currently taking no medication currently, is diet controlled, will check blood work. Patient is currently on an ACE inhibitor/ARB. Patient has not seen an ophthalmologist this year. Patient denies any issues with their feet. The symptom started onset as an adult hyperlipidemia and hypertension and CAD and CKD 4 ARE RELATED TO DM   Patient's diabetes and hyperlipidemia and hypertension and abdominal pannus/yeast problems are more complicated by the patient's morbid obesity.  Discussed weight loss and lifestyle modification and exercise with the patient.  Patient is currently going under chemo treatment and has been losing some weight  Hyperlipidemia Patient is coming in for recheck of his hyperlipidemia. The patient is currently taking Crestor. They deny any issues with myalgias or history of liver damage from it. They deny any focal numbness or weakness or chest pain.   Hypertension Patient is currently on losartan and amlodipine and carvedilol, and their blood pressure today is 141/86. Patient denies any lightheadedness or dizziness. Patient denies headaches, blurred vision, chest pains, shortness of breath, or weakness. Denies any side effects from medication and is content with current medication.   Rash in groin and under abdomen Patient is still having trouble with the rash under his abdomen under his pannus and in his groin.  He did try the powders and has been doing the cream as well and it just does not seem to be helping.  He is having trouble getting on top of  it.  Relevant past medical, surgical, family and social history reviewed and updated as indicated. Interim medical history since our last visit reviewed. Allergies and medications reviewed and updated.  Review of Systems  Constitutional:  Negative for chills and fever.  Eyes:  Negative for visual disturbance.  Respiratory:  Negative for shortness of breath and wheezing.   Cardiovascular:  Negative for chest pain and leg swelling.  Musculoskeletal:  Negative for back pain and gait problem.  Skin:  Positive for rash. Negative for color change.  Neurological:  Negative for dizziness, weakness and light-headedness.  All other systems reviewed and are negative.  Per HPI unless specifically indicated above   Allergies as of 12/08/2020       Reactions   Carbidopa-levodopa Other (See Comments)   hallunications   Morphine Sulfate Nausea And Vomiting   Sulfa Antibiotics Nausea And Vomiting   Sulfacetamide Sodium Nausea And Vomiting        Medication List        Accurate as of December 08, 2020  1:37 PM. If you have any questions, ask your nurse or doctor.          abiraterone acetate 250 MG tablet Commonly known as: ZYTIGA Take 2 tablets (500 mg total) by mouth daily. Take on an empty stomach 1 hour before or 2 hours after a meal   allopurinol 300 MG tablet Commonly known as: ZYLOPRIM Take 1 tablet by mouth once daily   amLODipine 5 MG tablet Commonly known as: NORVASC  Take 1 tablet (5 mg total) by mouth daily.   carvedilol 25 MG tablet Commonly known as: COREG Take 1 tablet by mouth twice daily   DULoxetine 60 MG capsule Commonly known as: CYMBALTA Take 1 capsule (60 mg total) by mouth daily. Take 1 capsule by mouth once daily (Needs to be seen before next refill)   fluconazole 150 MG tablet Commonly known as: Diflucan Take 1 tablet (150 mg total) by mouth once a week. Started by: Worthy Rancher, MD   HYDROcodone-acetaminophen 5-325 MG tablet Commonly known  as: NORCO/VICODIN Take 1 tablet by mouth every 6 (six) hours as needed.   lidocaine 5 % Commonly known as: Lidoderm Place 1 patch onto the skin daily. Remove & Discard patch within 12 hours or as directed by MD   losartan 25 MG tablet Commonly known as: Cozaar Take 1 tablet (25 mg total) by mouth daily.   nystatin powder Commonly known as: MYCOSTATIN/NYSTOP Apply 1 application topically 3 (three) times daily.   pantoprazole 40 MG tablet Commonly known as: PROTONIX Take 1 tablet (40 mg total) by mouth 2 (two) times daily before a meal.   potassium chloride SA 20 MEQ tablet Commonly known as: KLOR-CON Take 1 tablet (20 mEq total) by mouth 2 (two) times daily.   pregabalin 25 MG capsule Commonly known as: LYRICA Take 1 capsule by mouth twice daily   rosuvastatin 20 MG tablet Commonly known as: CRESTOR TAKE 1 TABLET BY MOUTH ONCE DAILY -  STOP  PRAVASTATIN -- NEEDS FOLLOW UP APPOINTMENT FOR REFILLS   tamsulosin 0.4 MG Caps capsule Commonly known as: FLOMAX Take 1 capsule (0.4 mg total) by mouth daily.   traMADol 50 MG tablet Commonly known as: ULTRAM Take 1 tablet (50 mg total) by mouth daily as needed.         Objective:   BP (!) 141/86   Pulse 68   Ht 5\' 7"  (1.702 m)   Wt 250 lb 8 oz (113.6 kg)   SpO2 97%   BMI 39.23 kg/m   Wt Readings from Last 3 Encounters:  12/08/20 250 lb 8 oz (113.6 kg)  11/29/20 259 lb (117.5 kg)  11/23/20 259 lb (117.5 kg)    Physical Exam Vitals and nursing note reviewed.  Constitutional:      General: He is not in acute distress.    Appearance: He is well-developed. He is not diaphoretic.  Eyes:     General: No scleral icterus.    Conjunctiva/sclera: Conjunctivae normal.  Neck:     Thyroid: No thyromegaly.  Cardiovascular:     Rate and Rhythm: Normal rate and regular rhythm.     Heart sounds: Normal heart sounds. No murmur heard. Pulmonary:     Effort: Pulmonary effort is normal. No respiratory distress.     Breath  sounds: Normal breath sounds. No wheezing.  Musculoskeletal:        General: Normal range of motion.     Cervical back: Neck supple.  Lymphadenopathy:     Cervical: No cervical adenopathy.  Skin:    General: Skin is warm and dry.     Findings: Rash (Inflamed irritated pink skin under pannus and in groin) present.  Neurological:     Mental Status: He is alert and oriented to person, place, and time.     Coordination: Coordination normal.  Psychiatric:        Behavior: Behavior normal.      Assessment & Plan:   Problem List Items Addressed This  Visit       Cardiovascular and Mediastinum   Essential hypertension, benign     Other   Prediabetes   Relevant Orders   Bayer DCA Hb A1c Waived   Morbid obesity ([place])   Hyperlipidemia - Primary   Relevant Orders   Lipid panel   Other Visit Diagnoses     Yeast dermatitis       Relevant Medications   fluconazole (DIFLUCAN) 150 MG tablet       Give Diflucan pill for rash, otherwise continue current medicine, will check labs when he gets his next lab draw. Continue to follow with oncology  He now has a nephrologist because of his kidney disease. Follow up plan: Return in about 3 months (around 03/09/2021), or if symptoms worsen or fail to improve, for Hypertension and prediabetes and cholesterol.  Counseling provided for all of the vaccine components Orders Placed This Encounter  Procedures   Bayer [place] Hb A1c Waived   Lipid panel    Caryl Pina, MD [facility] 12/08/2020, 1:37 PM

## 2020-12-09 ENCOUNTER — Inpatient Hospital Stay (HOSPITAL_BASED_OUTPATIENT_CLINIC_OR_DEPARTMENT_OTHER): Payer: Medicare Other | Admitting: Hematology

## 2020-12-09 VITALS — BP 129/80 | HR 62 | Temp 96.6°F | Resp 16 | Wt 250.8 lb

## 2020-12-09 DIAGNOSIS — C61 Malignant neoplasm of prostate: Secondary | ICD-10-CM

## 2020-12-09 DIAGNOSIS — Z1589 Genetic susceptibility to other disease: Secondary | ICD-10-CM | POA: Diagnosis not present

## 2020-12-09 DIAGNOSIS — G629 Polyneuropathy, unspecified: Secondary | ICD-10-CM | POA: Diagnosis not present

## 2020-12-09 DIAGNOSIS — N3281 Overactive bladder: Secondary | ICD-10-CM | POA: Diagnosis not present

## 2020-12-09 DIAGNOSIS — F17210 Nicotine dependence, cigarettes, uncomplicated: Secondary | ICD-10-CM | POA: Diagnosis not present

## 2020-12-09 DIAGNOSIS — N183 Chronic kidney disease, stage 3 unspecified: Secondary | ICD-10-CM | POA: Diagnosis not present

## 2020-12-09 DIAGNOSIS — I129 Hypertensive chronic kidney disease with stage 1 through stage 4 chronic kidney disease, or unspecified chronic kidney disease: Secondary | ICD-10-CM | POA: Diagnosis not present

## 2020-12-09 NOTE — Patient Instructions (Addendum)
[place] at [facility] Discharge Instructions  You were seen and examined today by Dr. Delton Coombes. Dr. Delton Coombes reviewed your recent lab results. Your PSA is trending up. Dr. Delton Coombes has recommended restarting (abiraterone acetate) Zytiga at 500mg  daily (two pills daily), and Prednisone 5mg  once daily.   Dr. Delton Coombes will see you again in about a month with repeat labs.   Thank you for choosing [place] at [facility] to provide your oncology and hematology care.  To afford each patient quality time with our provider, please arrive at least 15 minutes before your scheduled appointment time.   If you have a lab appointment with the [place] please come in thru the Main Entrance and check in at the main information desk.  You need to re-schedule your appointment should you arrive 10 or more minutes late.  We strive to give you quality time with our providers, and arriving late affects you and other patients whose appointments are after yours.  Also, if you no show three or more times for appointments you may be dismissed from the clinic at the providers discretion.     Again, thank you for choosing [facility].  Our hope is that these requests will decrease the amount of time that you wait before being seen by our physicians.       _____________________________________________________________  Should you have questions after your visit to [facility], please contact our office at [phone] and follow the prompts.  Our office hours are 8:00 a.m. and 4:30 p.m. Monday - Friday.  Please note that voicemails left after 4:00 p.m. may not be returned until the following business day.  We are closed weekends and major holidays.  You do have access to a nurse 24-7, just call the main number to the clinic [phone] and do not press any options, hold on the line and a nurse will answer the phone.    For prescription  refill requests, have your pharmacy contact our office and allow 72 hours.    Due to Covid, you will need to wear a mask upon entering the hospital. If you do not have a mask, a mask will be given to you at the Main Entrance upon arrival. For doctor visits, patients may have 1 support person age 66 or older with them. For treatment visits, patients can not have anyone with them due to social distancing guidelines and our immunocompromised population.

## 2020-12-09 NOTE — Progress Notes (Signed)
Patient is not taking zytiga as prescribed. States that one of the Doctors took him off of it for now.

## 2020-12-13 ENCOUNTER — Other Ambulatory Visit: Payer: Self-pay

## 2020-12-13 ENCOUNTER — Ambulatory Visit (HOSPITAL_COMMUNITY)
Admission: RE | Admit: 2020-12-13 | Discharge: 2020-12-13 | Disposition: A | Discharge status: Home / Self Care | Payer: Medicare Other | Source: Ambulatory Visit | Attending: Nephrology | Admitting: Nephrology

## 2020-12-13 DIAGNOSIS — E6609 Other obesity due to excess calories: Secondary | ICD-10-CM

## 2020-12-13 DIAGNOSIS — D631 Anemia in chronic kidney disease: Secondary | ICD-10-CM | POA: Diagnosis not present

## 2020-12-13 DIAGNOSIS — D472 Monoclonal gammopathy: Secondary | ICD-10-CM

## 2020-12-13 DIAGNOSIS — N184 Chronic kidney disease, stage 4 (severe): Secondary | ICD-10-CM | POA: Diagnosis not present

## 2020-12-13 DIAGNOSIS — E872 Acidosis, unspecified: Secondary | ICD-10-CM

## 2020-12-13 DIAGNOSIS — Z716 Tobacco abuse counseling: Secondary | ICD-10-CM | POA: Diagnosis not present

## 2020-12-13 DIAGNOSIS — N200 Calculus of kidney: Secondary | ICD-10-CM

## 2020-12-13 DIAGNOSIS — N281 Cyst of kidney, acquired: Secondary | ICD-10-CM | POA: Diagnosis not present

## 2020-12-13 DIAGNOSIS — N189 Chronic kidney disease, unspecified: Secondary | ICD-10-CM | POA: Diagnosis not present

## 2020-12-20 DIAGNOSIS — Z79899 Other long term (current) drug therapy: Secondary | ICD-10-CM | POA: Diagnosis not present

## 2020-12-20 DIAGNOSIS — D519 Vitamin B12 deficiency anemia, unspecified: Secondary | ICD-10-CM | POA: Diagnosis not present

## 2020-12-20 DIAGNOSIS — N200 Calculus of kidney: Secondary | ICD-10-CM | POA: Diagnosis not present

## 2020-12-20 DIAGNOSIS — N189 Chronic kidney disease, unspecified: Secondary | ICD-10-CM | POA: Diagnosis not present

## 2020-12-20 DIAGNOSIS — E872 Acidosis: Secondary | ICD-10-CM | POA: Diagnosis not present

## 2020-12-20 DIAGNOSIS — D472 Monoclonal gammopathy: Secondary | ICD-10-CM | POA: Diagnosis not present

## 2020-12-20 DIAGNOSIS — N184 Chronic kidney disease, stage 4 (severe): Secondary | ICD-10-CM | POA: Diagnosis not present

## 2020-12-20 DIAGNOSIS — Z131 Encounter for screening for diabetes mellitus: Secondary | ICD-10-CM | POA: Diagnosis not present

## 2020-12-20 DIAGNOSIS — Z716 Tobacco abuse counseling: Secondary | ICD-10-CM | POA: Diagnosis not present

## 2020-12-20 DIAGNOSIS — E6609 Other obesity due to excess calories: Secondary | ICD-10-CM | POA: Diagnosis not present

## 2020-12-20 DIAGNOSIS — D631 Anemia in chronic kidney disease: Secondary | ICD-10-CM | POA: Diagnosis not present

## 2020-12-24 DIAGNOSIS — R809 Proteinuria, unspecified: Secondary | ICD-10-CM | POA: Diagnosis not present

## 2020-12-24 DIAGNOSIS — Z5181 Encounter for therapeutic drug level monitoring: Secondary | ICD-10-CM | POA: Diagnosis not present

## 2020-12-24 DIAGNOSIS — N281 Cyst of kidney, acquired: Secondary | ICD-10-CM | POA: Diagnosis not present

## 2020-12-24 DIAGNOSIS — D631 Anemia in chronic kidney disease: Secondary | ICD-10-CM | POA: Diagnosis not present

## 2020-12-24 DIAGNOSIS — E872 Acidosis: Secondary | ICD-10-CM | POA: Diagnosis not present

## 2020-12-24 DIAGNOSIS — N170 Acute kidney failure with tubular necrosis: Secondary | ICD-10-CM | POA: Diagnosis not present

## 2020-12-24 DIAGNOSIS — E876 Hypokalemia: Secondary | ICD-10-CM | POA: Diagnosis not present

## 2020-12-24 DIAGNOSIS — I5032 Chronic diastolic (congestive) heart failure: Secondary | ICD-10-CM | POA: Diagnosis not present

## 2020-12-24 DIAGNOSIS — N184 Chronic kidney disease, stage 4 (severe): Secondary | ICD-10-CM | POA: Diagnosis not present

## 2020-12-24 DIAGNOSIS — E559 Vitamin D deficiency, unspecified: Secondary | ICD-10-CM | POA: Diagnosis not present

## 2020-12-24 DIAGNOSIS — E211 Secondary hyperparathyroidism, not elsewhere classified: Secondary | ICD-10-CM | POA: Diagnosis not present

## 2020-12-24 DIAGNOSIS — E6609 Other obesity due to excess calories: Secondary | ICD-10-CM | POA: Diagnosis not present

## 2020-12-29 NOTE — Telephone Encounter (Signed)
Oral Oncology Patient Advocate Encounter   Was successful in securing patient an [phone] grant from Patient [facility]) to provide copayment coverage for Zytiga.  This will keep the out of pocket expense at $0.     I have spoken with the patient.    The billing information is as follows and has been shared with [place].   Member ID: [id number]  Group ID: [id number] RxBin: [id number] Dates of Eligibility: 07/08/20 through 10/05/21  Fund:  [place] Patient [place] Phone [phone] Fax [phone]

## 2021-01-06 ENCOUNTER — Inpatient Hospital Stay (HOSPITAL_COMMUNITY): Payer: Medicare Other

## 2021-01-06 ENCOUNTER — Inpatient Hospital Stay (HOSPITAL_COMMUNITY): Payer: Medicare Other | Admitting: Hematology

## 2021-01-11 ENCOUNTER — Telehealth: Payer: Self-pay

## 2021-01-11 NOTE — Telephone Encounter (Signed)
Pt called back and is aware of appt

## 2021-01-11 NOTE — Telephone Encounter (Signed)
Tried calling pt. No answer and no voicemail. Tried x2 on pts number. I spoke to pts wife and she is in rehab and pt is not with her and states that she cannot make an appt for him.  Will try pt again tomorrow.  30 min appt has been scheduled for October 27th at 2:40

## 2021-01-12 ENCOUNTER — Other Ambulatory Visit: Payer: Self-pay | Admitting: Family Medicine

## 2021-01-12 ENCOUNTER — Other Ambulatory Visit (HOSPITAL_COMMUNITY): Payer: Self-pay | Admitting: Physician Assistant

## 2021-01-12 DIAGNOSIS — K219 Gastro-esophageal reflux disease without esophagitis: Secondary | ICD-10-CM

## 2021-01-12 DIAGNOSIS — I10 Essential (primary) hypertension: Secondary | ICD-10-CM

## 2021-01-12 DIAGNOSIS — E876 Hypokalemia: Secondary | ICD-10-CM

## 2021-01-20 ENCOUNTER — Ambulatory Visit (HOSPITAL_COMMUNITY): Payer: Medicare Other | Admitting: Hematology

## 2021-01-20 ENCOUNTER — Inpatient Hospital Stay (HOSPITAL_COMMUNITY): Payer: Medicare Other

## 2021-01-27 ENCOUNTER — Ambulatory Visit: Payer: Medicare Other | Admitting: Family Medicine

## 2021-01-28 ENCOUNTER — Encounter: Payer: Self-pay | Admitting: Family Medicine

## 2021-02-03 ENCOUNTER — Ambulatory Visit: Payer: Federal, State, Local not specified - PPO | Admitting: Urology

## 2021-02-06 ENCOUNTER — Encounter (HOSPITAL_COMMUNITY): Payer: Self-pay | Admitting: *Deleted

## 2021-02-06 ENCOUNTER — Emergency Department (HOSPITAL_COMMUNITY): Payer: Medicare Other

## 2021-02-06 ENCOUNTER — Emergency Department (HOSPITAL_COMMUNITY)
Admission: EM | Admit: 2021-02-06 | Discharge: 2021-02-06 | Disposition: A | Discharge status: Home / Self Care | Payer: Medicare Other | Attending: Emergency Medicine | Admitting: Emergency Medicine

## 2021-02-06 DIAGNOSIS — N183 Chronic kidney disease, stage 3 unspecified: Secondary | ICD-10-CM | POA: Insufficient documentation

## 2021-02-06 DIAGNOSIS — C61 Malignant neoplasm of prostate: Secondary | ICD-10-CM | POA: Insufficient documentation

## 2021-02-06 DIAGNOSIS — F17210 Nicotine dependence, cigarettes, uncomplicated: Secondary | ICD-10-CM | POA: Insufficient documentation

## 2021-02-06 DIAGNOSIS — I129 Hypertensive chronic kidney disease with stage 1 through stage 4 chronic kidney disease, or unspecified chronic kidney disease: Secondary | ICD-10-CM | POA: Insufficient documentation

## 2021-02-06 DIAGNOSIS — I509 Heart failure, unspecified: Secondary | ICD-10-CM | POA: Insufficient documentation

## 2021-02-06 DIAGNOSIS — R059 Cough, unspecified: Secondary | ICD-10-CM | POA: Diagnosis not present

## 2021-02-06 DIAGNOSIS — U071 COVID-19: Secondary | ICD-10-CM | POA: Diagnosis not present

## 2021-02-06 DIAGNOSIS — R509 Fever, unspecified: Secondary | ICD-10-CM | POA: Diagnosis not present

## 2021-02-06 DIAGNOSIS — J449 Chronic obstructive pulmonary disease, unspecified: Secondary | ICD-10-CM | POA: Diagnosis not present

## 2021-02-06 LAB — BASIC METABOLIC PANEL
Anion gap: 11 (ref 5–15)
BUN: 25 mg/dL — ABNORMAL HIGH (ref 8–23)
CO2: 21 mmol/L — ABNORMAL LOW (ref 22–32)
Calcium: 8.1 mg/dL — ABNORMAL LOW (ref 8.9–10.3)
Chloride: 107 mmol/L (ref 98–111)
Creatinine, Ser: 2.41 mg/dL — ABNORMAL HIGH (ref 0.61–1.24)
GFR, Estimated: 27 mL/min — ABNORMAL LOW (ref >60)
Glucose, Bld: 113 mg/dL — ABNORMAL HIGH (ref 70–99)
Potassium: 3.5 mmol/L (ref 3.5–5.1)
Sodium: 139 mmol/L (ref 135–145)

## 2021-02-06 LAB — CBC
HCT: 35.3 % — ABNORMAL LOW (ref 39.0–52.0)
Hemoglobin: 11.5 g/dL — ABNORMAL LOW (ref 13.0–17.0)
MCH: 30 pg (ref 26.0–34.0)
MCHC: 32.6 g/dL (ref 30.0–36.0)
MCV: 92.2 fL (ref 80.0–100.0)
Platelets: 121 10*3/uL — ABNORMAL LOW (ref 150–400)
RBC: 3.83 MIL/uL — ABNORMAL LOW (ref 4.22–5.81)
RDW: 15.5 % (ref 11.5–15.5)
WBC: 3.4 10*3/uL — ABNORMAL LOW (ref 4.0–10.5)
nRBC: 0 % (ref 0.0–0.2)

## 2021-02-06 LAB — RESP PANEL BY RT-PCR (FLU A&B, COVID) ARPGX2
Influenza A by PCR: NEGATIVE
Influenza B by PCR: NEGATIVE
SARS Coronavirus 2 by RT PCR: POSITIVE — AB

## 2021-02-06 MED ORDER — ALBUTEROL SULFATE HFA 108 (90 BASE) MCG/ACT IN AERS: 1.0000 | INHALATION_SPRAY | Freq: Four times a day (QID) | RESPIRATORY_TRACT | 3 refills | Status: DC | PRN

## 2021-02-06 MED ORDER — ONDANSETRON 4 MG PO TBDP: 4.0000 mg | ORAL_TABLET | Freq: Three times a day (TID) | ORAL | 0 refills | Status: DC | PRN

## 2021-02-06 MED ORDER — MOLNUPIRAVIR EUA 200MG CAPSULE: 4.0000 | ORAL_CAPSULE | Freq: Two times a day (BID) | ORAL | 0 refills | Status: AC

## 2021-02-06 NOTE — ED Provider Notes (Signed)
CKD - see Nephrology - had AKI recently - from home with "sickness" when returning from [facility] - coughing - fever, aches, nauseated, and pain in R lateral chest Has some ronchi - has some ? Blood clots from Penis -   WBC is low at 3.4, Hgb stable, No rib frx from August fall.  W/u pending.    Medical screening examination/treatment/procedure(s) were conducted as a shared visit with non-physician practitioner(s) and myself.  I personally evaluated the patient during the encounter.  Clinical Impression:   Final diagnoses:  None         Noemi Chapel, MD 02/11/21 1423

## 2021-02-06 NOTE — ED Notes (Signed)
Pt came back from [facility] 3 days ago. Got home and became nauseated with generalized weakness and cough. States has not vomited and no bm x 7 days. A/o. Color slightly pale. Mm dry. Nad at this time.

## 2021-02-06 NOTE — Discharge Instructions (Addendum)
1. Medications: Alternate tylenol and ibuprofen for fever control, continue usual home medications. Use albuterol inhaler as needed for shortness of breath. You were also prescribed an antiviral medication called Lagevrio and you will take 4 capsules twice daily for five days. Start this today. 2. Treatment: rest, drink plenty of fluids, isolate for the next 5 days 3. Follow Up: Please followup with your primary doctor if your symptoms are not improving after 10-14 days; Please return to the ER for high fevers, persistent vomiting, shortness of breath or other concerns.

## 2021-02-06 NOTE — ED Notes (Signed)
Positive for COVID

## 2021-02-06 NOTE — ED Triage Notes (Signed)
Fever body aches , unable to eat due to nausea onset 4 days ago

## 2021-02-06 NOTE — ED Provider Notes (Signed)
[facility] Provider Note   CSN: [id number] Arrival date & time: 02/06/21  1444     History No chief complaint on file.   [name] is a 75 y.o. male with past medical history significant for CKD stage III, prostate cancer, heart disease, kidney stones, diastolic CHF, well compensated who presents today with a chief complaint of flulike illness starting 3 days ago when patient was returning home from [facility].  Patient states symptoms started with inappetence and mild nausea, and has progressed to productive cough, subjective fever and worsening nausea.  Patient also describes a sharp pain that started around the same time right where the right upper quadrant meets the right chest.  Per chart review, patient had rib pain of the same area back in August when he experienced a fall, however imaging was negative for rib fracture at that time.  Patient's wife also having similar symptoms.  Patient also states that he has been having blood clots coming from his penis that is new for him.  Patient last saw Dr. Theador Hawthorne with nephrology in September 2022 where he was diagnosed with acute tubular necrosis.  HPI     Past Medical History:  Diagnosis Date   Aortic atherosclerosis ([place])    2004  s/p  closure Penetrating atherosclerotic ulcer of infarenal aorta w/ endovascular stent graft   Arthritis    CKD (chronic kidney disease), stage III (HCC)    Closed fracture of head of humerus 07/2017   right shoulder from fall; 09-25-2017 per pt no surgical intervention, only wore sling, intermittant pain   Coronary atherosclerosis of native coronary artery    positive myoview for ischemia 09-27-2009;  10-01-2009 per cardiac cath-- minimal nonobstructive CAD w/ 30% LAD    ED (erectile dysfunction)    Emphysema/COPD ([place])    followed by pcp--- last exacerbation 09-12-2017;  09-25-2017 per pt no cough, sob or congestion   Essential hypertension    Family history of kidney  cancer    First degree heart block    Frequency of urination    GERD (gastroesophageal reflux disease)    Gout    09-25-2017  per pt stable,  last episode 2015   Heart murmur    History of adenomatous polyp of colon    History of closed head injury 2005   per pt residual resolved   History of external beam radiation therapy    completed 10/ 2015 for prostate cancer   History of gastric ulcer 2014   History of kidney stones    History of pneumothorax    spontaneous pneumo treated w/ chest tube   History of rib fracture 07/2017   from fall,  right side 6th,7th,8th   Neuropathy    OAB (overactive bladder)    OSA (obstructive sleep apnea)    Intolerant of CPAP   Prostate cancer ([place])    dx 09-26-2011 via bx-- Stage T3b,N0,  Gleason 4+4, PSA 11.2 with METs to external iliac lymph node(resolved with ADT)-- treated w/ ADT ;   05/2013 staging work-up for rising PSA , started casodex and completed radiation therapy 10/ 2015;   rising PSA post treatment   RLS (restless legs syndrome)    Ureteral neocystostomy bleed    left side   Urge urinary incontinence     Patient Active Problem List   Diagnosis Date Noted   Incisional hernia, without obstruction or gangrene 66/44/0347   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 01/19/2020   Family history of  kidney cancer    Chronic kidney disease (CKD), stage IV (severe) ([place]) 07/10/2018   Depression, recurrent ([place]) 04/08/2018   Neuropathy 03/17/2015   Prostate cancer ([place]) 06/30/2013   Abdominal aneurysm without mention of rupture 02/19/2013   Morbid obesity ([place]) 01/14/2013   VITAMIN B12 DEFICIENCY 01/14/2013   TOBACCO ABUSE 01/14/2013   PTSD 01/14/2013   COPD (chronic obstructive pulmonary disease) (HCC)    OSA (obstructive sleep apnea) 08/14/2012   RLS (restless legs syndrome) 08/14/2012   Prediabetes 08/14/2012   Aortic atherosclerosis ([place]) 02/19/2012   Coronary atherosclerosis of native coronary artery 03/23/2010   GERD 07/30/2008    History of colonic polyps 07/30/2008   Benign prostatic hyperplasia with lower urinary tract symptoms 02/20/2008   Hyperlipidemia 01/31/2007   GOUT 01/31/2007   Essential hypertension, benign 01/31/2007    Past Surgical History:  Procedure Laterality Date   ABDOMINAL AORTIC ENDOVASCULAR STENT GRAFT  09-13-2007    dr [name]  [facility]   closure penetrating atherosclerotic ulcer of infarenal aorta with stent graft   BIOPSY  03/06/2019   Procedure: BIOPSY;  Surgeon: [name], MD;  [facility];  Service: Endoscopy;;  gastric   CARDIAC CATHETERIZATION  2005   per pt normal (done in [place])   [place]  10/06/2009    dr cooper   minimal nonobstructive CAD w/30% LAD otherwise normal coronaries, LVEDP 27mmHg   CARDIOVASCULAR STRESS TEST  09/27/2009    dr Aundra Dubin   lexiscan nuclear study w/ moderate reversible inferior perfusion defect ischemia,  ef 60% (cardiac cath scheduled)   CATARACT EXTRACTION W/PHACO Right 07/12/2020   Procedure: CATARACT EXTRACTION PHACO AND INTRAOCULAR LENS PLACEMENT RIGHT EYE;  Surgeon: [name], MD;  Location: [facility];  Service: Ophthalmology;  Laterality: Right;  CDE=15.48   CHEST TUBE INSERTION  1989   "collapsed lung"due to injury   CIRCUMCISION  09/26/2011   Procedure: CIRCUMCISION ADULT;  Surgeon: [name], MD;  Location: [facility];  Service: Urology;  Laterality: N/A;   COLONOSCOPY W/ POLYPECTOMY     COLONOSCOPY WITH PROPOFOL N/A 04/27/2016    eight 4-8 mm polyps in descending colon, at splenic flexure, in ascending colon and cecum. Tubular adenomas. Surveillance in 3 years.   CYSTOSCOPY  09/26/2011   Procedure: CYSTOSCOPY;  Surgeon: [name], MD;  Location: [facility];  Service: Urology;  Laterality: N/A;   CYSTOSCOPY/RETROGRADE/URETEROSCOPY Left 09/27/2017   Procedure: CYSTOSCOPYLEFT /RETROGRADE/URETEROSCOPY AND RENAL WASHINGS;  Surgeon: [name], MD;  Location: [facility];  Service: Urology;  Laterality: Left;    ESOPHAGOGASTRODUODENOSCOPY (EGD) WITH PROPOFOL N/A 04/27/2016   normal esophagus s/p dilation, small hiatal hernia, normal duodenum   ESOPHAGOGASTRODUODENOSCOPY (EGD) WITH PROPOFOL N/A 03/06/2019   erosive reflux esophagitis, s/p dilation, normal duodenum, abnormal gastric mucosa s/p biopsy. Hyperplastic gastric polyp. No H.pylori.    EXTRACORPOREAL SHOCK WAVE LITHOTRIPSY  yrs ago   FRACTURE SURGERY  child   Bilateral lower arms    KNEE ARTHROSCOPY Right 2013   MALONEY DILATION N/A 04/27/2016   Procedure: Venia Minks DILATION;  Surgeon: [name], MD;  [facility];  Service: Endoscopy;  Laterality: N/A;   MALONEY DILATION N/A 03/06/2019   Procedure: Venia Minks DILATION;  Surgeon: [name], MD;  [facility];  Service: Endoscopy;  Laterality: N/A;   PARTIAL KNEE ARTHROPLASTY Right 04/26/2015   Procedure: RIGHT KNEE MEDIAL UNICOMPARTMENTAL ARTHROPLASTY;  Surgeon: Gaynelle Arabian, MD;  [facility];  Service: Orthopedics;  Laterality: Right;  POLYPECTOMY  04/27/2016   Procedure: POLYPECTOMY;  Surgeon: [name], MD;  [facility];  Service: Endoscopy;;  cecal , ascending, descending, and sigmoid polypectomies   PROSTATE BIOPSY  09/26/2011   Procedure: BIOPSY TRANSRECTAL ULTRASONIC PROSTATE (TUBP);  Surgeon: [name], MD;  [facility];  Service: Urology;  Laterality: N/A;      TRANSTHORACIC ECHOCARDIOGRAM  01-07-2013   dr [name]   ef 60-65%,  grade 1 diastolic dysfunction/  mild AR without stenosis/  mild LAE/  mild to moderate calcificed MV annulus without regurg. or stenosis/   UMBILICAL HERNIA REPAIR  yrs ago       Family History  Problem Relation Age of Onset   Stroke Mother    Alzheimer's disease Mother 37       probably due to head injury   Mesothelioma Father 22   Kidney cancer Father    Hyperlipidemia Sister    Heart attack Sister    Kidney cancer Sister        dx in her 47s   Heart disease Brother    Kidney cancer  Brother 84       dx in his 74s   Hyperlipidemia Brother    Kidney cancer Brother        dx in his 86s   Healthy Daughter    Healthy Son    Healthy Son    Colon cancer Neg Hx     Social History   Tobacco Use   Smoking status: Every Day    Packs/day: 0.50    Years: 42.00    Pack years: 21.00    Types: Cigarettes   Smokeless tobacco: Never  Vaping Use   Vaping Use: Never used  Substance Use Topics   Alcohol use: No    Alcohol/week: 0.0 standard drinks   Drug use: No    Home Medications Prior to Admission medications   Medication Sig Start Date End Date Taking? Authorizing Provider  abiraterone acetate (ZYTIGA) 250 MG tablet Take 2 tablets (500 mg total) by mouth daily. Take on an empty stomach 1 hour before or 2 hours after a meal Patient not taking: Reported on 12/09/2020 10/27/20   Heath Lark, MD  allopurinol (ZYLOPRIM) 300 MG tablet Take 1 tablet by mouth once daily 01/12/21   [name], [name], MD  amLODipine (NORVASC) 5 MG tablet Take 1 tablet (5 mg total) by mouth daily. 10/14/20   [name], [name], MD  carvedilol (COREG) 25 MG tablet Take 1 tablet by mouth twice daily 01/12/21   [name], [name], MD  DULoxetine (CYMBALTA) 60 MG capsule Take 1 capsule (60 mg total) by mouth daily. Take 1 capsule by mouth once daily (Needs to be seen before next refill) 05/19/20   [name], [name], MD  fluconazole (DIFLUCAN) 150 MG tablet Take 1 tablet (150 mg total) by mouth once a week. 12/08/20   [name], [name], MD  HYDROcodone-acetaminophen (NORCO/VICODIN) 5-325 MG tablet Take 1 tablet by mouth every 6 (six) hours as needed. 11/18/20   Evalee Jefferson, PA-C  lidocaine (LIDODERM) 5 % Place 1 patch onto the skin daily. Remove & Discard patch within 12 hours or as directed by MD 11/18/20   Evalee Jefferson, PA-C  losartan (COZAAR) 25 MG tablet Take 1 tablet (25 mg total) by mouth daily. 10/14/20   [name], [name], MD  nystatin (MYCOSTATIN/NYSTOP) powder Apply 1 application topically 3  (three) times daily. 04/19/20   [name], [name], MD  pantoprazole (  PROTONIX) 40 MG tablet TAKE 1 TABLET BY MOUTH TWICE DAILY BEFORE A MEAL 01/12/21   Gwenlyn Perking, FNP  potassium chloride SA (KLOR-CON) 20 MEQ tablet Take 1 tablet by mouth twice daily 01/12/21   Tarri Abernethy M, PA-C  pregabalin (LYRICA) 25 MG capsule Take 1 capsule by mouth twice daily 05/04/20   Derek Jack, MD  rosuvastatin (CRESTOR) 20 MG tablet TAKE 1 TABLET BY MOUTH ONCE DAILY -  STOP  PRAVASTATIN -- NEEDS FOLLOW UP APPOINTMENT FOR REFILLS 09/30/20   Satira Sark, MD  tamsulosin (FLOMAX) 0.4 MG CAPS capsule Take 1 capsule (0.4 mg total) by mouth daily. 04/29/20   [name], MD  traMADol (ULTRAM) 50 MG tablet Take 1 tablet (50 mg total) by mouth daily as needed. 05/19/20   [name], [name], MD    Allergies    Carbidopa-levodopa, Morphine sulfate, Sulfa antibiotics, and Sulfacetamide sodium  Review of Systems   Review of Systems  Constitutional:  Positive for appetite change, chills and fatigue. Negative for fever.  HENT:  Positive for sore throat.   Eyes:  Negative for redness and visual disturbance.  Respiratory:  Positive for cough. Negative for chest tightness.   Cardiovascular:  Negative for chest pain.  Gastrointestinal:  Positive for vomiting. Negative for abdominal pain and diarrhea.  Endocrine: Negative.   Genitourinary: Negative.  Negative for dysuria.  Musculoskeletal:  Positive for myalgias.  Skin: Negative.   Neurological: Negative.  Negative for headaches.  Psychiatric/Behavioral: Negative.    All other systems reviewed and are negative.  Physical Exam Updated Vital Signs BP (!) 163/91 (BP Location: Right Arm)   Pulse 62   Temp 98 F (36.7 C) (Oral)   Resp 16   SpO2 94%   Physical Exam Vitals and nursing note reviewed.  Constitutional:      General: He is not in acute distress.    Appearance: He is ill-appearing.  HENT:     Head: Atraumatic.     Nose: Nose  normal.  Eyes:     Extraocular Movements: Extraocular movements intact.  Cardiovascular:     Rate and Rhythm: Normal rate and regular rhythm.     Pulses: Normal pulses.     Heart sounds: Normal heart sounds.  Pulmonary:     Effort: Pulmonary effort is normal. No tachypnea or respiratory distress.     Breath sounds: Examination of the right-middle field reveals rhonchi. Examination of the right-lower field reveals rhonchi. Rhonchi present.  Abdominal:     General: Abdomen is flat.     Palpations: Abdomen is soft.     Tenderness: There is abdominal tenderness (Tenderness to palpation on the right upper quadrant). There is no right CVA tenderness, left CVA tenderness or rebound.  Musculoskeletal:        General: Normal range of motion.  Skin:    General: Skin is warm.     Capillary Refill: Capillary refill takes less than 2 seconds.  Neurological:     General: No focal deficit present.     Mental Status: He is alert.  Psychiatric:        Mood and Affect: Mood normal.    ED Results / Procedures / Treatments   Labs (all labs ordered are listed, but only abnormal results are displayed) Labs Reviewed  RESP PANEL BY RT-PCR (FLU A&B, COVID) [id number]  BASIC METABOLIC PANEL  CBC  URINALYSIS, ROUTINE W REFLEX MICROSCOPIC    EKG None  Radiology No results found.  Procedures Procedures  Medications Ordered in ED Medications - No data to display  ED Course  I have reviewed the triage vital signs and the nursing notes.  Pertinent labs & imaging results that were available during my care of the patient were reviewed by me and considered in my medical decision making (see chart for details).    MDM Rules/Calculators/A&P                         This is a 75 year old male with an extensive medical history who presents today with chief complaint of fevers, body aches and productive cough with right lower lateral chest pain.  Patient tested positive for COVID today.    All labs and  imaging were reviewed and interpreted by myself.   Labs ordered in the ED: BMP: Creatinine 2.41 which appears to be around patient's baseline, GFR of 27 also around patient's baseline CBC: Unremarkable  Imaging ordered in the ED: Chest x-ray: No evidence of infiltrate or consolidation  Discharge patient home with albuterol inhaler for symptoms as well as Lagevrio vs Paxlovid due to patient's poor kidney function.  Advised to return if he develops severe shortness of breath, worsening fever or worsening symptoms.    Final Clinical Impression(s) / ED Diagnoses Final diagnoses:  [id number]    Rx / DC Orders ED Discharge Orders          Ordered    molnupiravir EUA (LAGEVRIO) 200 mg CAPS capsule  2 times daily        02/06/21 1802    albuterol (VENTOLIN HFA) 108 (90 Base) MCG/ACT inhaler  Every 6 hours PRN        02/06/21 1802    ondansetron (ZOFRAN ODT) 4 MG disintegrating tablet  Every 8 hours PRN        02/06/21 1826             Tonye Pearson, PA-C 02/06/21 1942    Noemi Chapel, MD 02/06/21 2230

## 2021-02-08 ENCOUNTER — Encounter (HOSPITAL_COMMUNITY): Payer: Self-pay | Admitting: *Deleted

## 2021-02-08 ENCOUNTER — Emergency Department (HOSPITAL_COMMUNITY): Payer: Medicare Other

## 2021-02-08 ENCOUNTER — Emergency Department (HOSPITAL_COMMUNITY)
Admission: EM | Admit: 2021-02-08 | Discharge: 2021-02-08 | Disposition: A | Discharge status: Home / Self Care | Payer: Medicare Other | Attending: Emergency Medicine | Admitting: Emergency Medicine

## 2021-02-08 DIAGNOSIS — J449 Chronic obstructive pulmonary disease, unspecified: Secondary | ICD-10-CM | POA: Diagnosis not present

## 2021-02-08 DIAGNOSIS — N184 Chronic kidney disease, stage 4 (severe): Secondary | ICD-10-CM | POA: Insufficient documentation

## 2021-02-08 DIAGNOSIS — R0602 Shortness of breath: Secondary | ICD-10-CM | POA: Diagnosis not present

## 2021-02-08 DIAGNOSIS — F17210 Nicotine dependence, cigarettes, uncomplicated: Secondary | ICD-10-CM | POA: Diagnosis not present

## 2021-02-08 DIAGNOSIS — U071 COVID-19: Secondary | ICD-10-CM | POA: Insufficient documentation

## 2021-02-08 DIAGNOSIS — Z96651 Presence of right artificial knee joint: Secondary | ICD-10-CM | POA: Diagnosis not present

## 2021-02-08 DIAGNOSIS — Z79899 Other long term (current) drug therapy: Secondary | ICD-10-CM | POA: Insufficient documentation

## 2021-02-08 DIAGNOSIS — I129 Hypertensive chronic kidney disease with stage 1 through stage 4 chronic kidney disease, or unspecified chronic kidney disease: Secondary | ICD-10-CM | POA: Insufficient documentation

## 2021-02-08 DIAGNOSIS — R531 Weakness: Secondary | ICD-10-CM

## 2021-02-08 DIAGNOSIS — I2510 Atherosclerotic heart disease of native coronary artery without angina pectoris: Secondary | ICD-10-CM | POA: Insufficient documentation

## 2021-02-08 DIAGNOSIS — Z8546 Personal history of malignant neoplasm of prostate: Secondary | ICD-10-CM | POA: Diagnosis not present

## 2021-02-08 DIAGNOSIS — I517 Cardiomegaly: Secondary | ICD-10-CM | POA: Diagnosis not present

## 2021-02-08 LAB — CBC WITH DIFFERENTIAL/PLATELET
Abs Immature Granulocytes: 0.02 10*3/uL (ref 0.00–0.07)
Basophils Absolute: 0 10*3/uL (ref 0.0–0.1)
Basophils Relative: 0 %
Eosinophils Absolute: 0.1 10*3/uL (ref 0.0–0.5)
Eosinophils Relative: 1 %
HCT: 31.6 % — ABNORMAL LOW (ref 39.0–52.0)
Hemoglobin: 10.7 g/dL — ABNORMAL LOW (ref 13.0–17.0)
Immature Granulocytes: 1 %
Lymphocytes Relative: 12 %
Lymphs Abs: 0.4 10*3/uL — ABNORMAL LOW (ref 0.7–4.0)
MCH: 30.1 pg (ref 26.0–34.0)
MCHC: 33.9 g/dL (ref 30.0–36.0)
MCV: 88.8 fL (ref 80.0–100.0)
Monocytes Absolute: 0.3 10*3/uL (ref 0.1–1.0)
Monocytes Relative: 9 %
Neutro Abs: 2.7 10*3/uL (ref 1.7–7.7)
Neutrophils Relative %: 77 %
Platelets: 114 10*3/uL — ABNORMAL LOW (ref 150–400)
RBC: 3.56 MIL/uL — ABNORMAL LOW (ref 4.22–5.81)
RDW: 15.1 % (ref 11.5–15.5)
WBC: 3.5 10*3/uL — ABNORMAL LOW (ref 4.0–10.5)
nRBC: 0 % (ref 0.0–0.2)

## 2021-02-08 LAB — COMPREHENSIVE METABOLIC PANEL
ALT: 15 U/L (ref 0–44)
AST: 21 U/L (ref 15–41)
Albumin: 3.5 g/dL (ref 3.5–5.0)
Alkaline Phosphatase: 65 U/L (ref 38–126)
Anion gap: 6 (ref 5–15)
BUN: 23 mg/dL (ref 8–23)
CO2: 22 mmol/L (ref 22–32)
Calcium: 7.9 mg/dL — ABNORMAL LOW (ref 8.9–10.3)
Chloride: 108 mmol/L (ref 98–111)
Creatinine, Ser: 2.3 mg/dL — ABNORMAL HIGH (ref 0.61–1.24)
GFR, Estimated: 29 mL/min — ABNORMAL LOW (ref >60)
Glucose, Bld: 102 mg/dL — ABNORMAL HIGH (ref 70–99)
Potassium: 3.4 mmol/L — ABNORMAL LOW (ref 3.5–5.1)
Sodium: 136 mmol/L (ref 135–145)
Total Bilirubin: 0.9 mg/dL (ref 0.3–1.2)
Total Protein: 7.1 g/dL (ref 6.5–8.1)

## 2021-02-08 MED ORDER — POTASSIUM CHLORIDE CRYS ER 20 MEQ PO TBCR: 40.0000 meq | EXTENDED_RELEASE_TABLET | Freq: Once | ORAL | Status: DC

## 2021-02-08 MED ORDER — SODIUM CHLORIDE 0.9 % IV BOLUS
1000.0000 mL | Freq: Once | INTRAVENOUS | Status: AC
  Administered 2021-02-08: 1000 mL via INTRAVENOUS

## 2021-02-08 MED ORDER — ACETAMINOPHEN 500 MG PO TABS
1000.0000 mg | ORAL_TABLET | Freq: Once | ORAL | Status: AC
  Administered 2021-02-08: 1000 mg via ORAL
  Filled 2021-02-08: qty 2

## 2021-02-08 MED ORDER — POTASSIUM CHLORIDE CRYS ER 20 MEQ PO TBCR
40.0000 meq | EXTENDED_RELEASE_TABLET | Freq: Once | ORAL | Status: AC
  Administered 2021-02-08: 40 meq via ORAL
  Filled 2021-02-08: qty 2

## 2021-02-08 NOTE — ED Notes (Signed)
The patient maintained oxygen saturation of 96 percent with ambulation.

## 2021-02-08 NOTE — ED Notes (Signed)
States he is unable to eat food and has been that way for over a week

## 2021-02-08 NOTE — ED Triage Notes (Signed)
Diagnosed with covid 2 days ago, states he is getting weaker and walking is difficult

## 2021-02-08 NOTE — Discharge Instructions (Addendum)
Please use the medications you are prescribed 2 days ago to help treat your COVID symptoms. Molnupiravir is an antiviral that can be used to help treat your COVID infection.  Zofran can be used for nausea and vomiting.  I know you have not had much of an appetite but it is important that you try and eat small meals throughout the day and drink plenty of fluids to stay hydrated.  You can use Tylenol 1000 mg every 6 hours for fevers, body aches and pain.  You can also use the albuterol inhaler you were given to help with coughing and shortness of breath.  If you have vomiting and are unable to keep anything down, have falls or feel like you are going to pass out, or have worsening shortness of breath or chest pain you should return for reevaluation.

## 2021-02-08 NOTE — ED Notes (Signed)
Pt given soda and water for the PO challenge.

## 2021-02-08 NOTE — ED Notes (Signed)
Lab contacted to make aware the CBC did not result. They are working on the problem.

## 2021-02-08 NOTE — ED Provider Notes (Signed)
[facility] EMERGENCY DEPARTMENT Provider Note   CSN: [id number] Arrival date & time: 02/08/21  1253     History Chief Complaint  Patient presents with   Weakness    [name] is a 75 y.o. male.  [name] is a 75 y.o. male with a history of CAD, hypertension, CKD, sleep apnea, RLS, GERD, who presents to the emergency department for evaluation of weakness.  Patient was seen in the ED on 11/6 and diagnosed with COVID.  Symptoms initially started on 11/4.  During ED encounter he had reassuring evaluation and was prescribed mulnupiravir, Zofran and an albuterol inhaler for symptomatic management.  Patient reports he had not picked up or begun taking any of these medications until today because he had not felt well enough to leave the house.  He reports that he has been feeling progressively weaker.  He reports he has had 3 instances where he tried to get up off of the couch and is fallen back onto the couch due to weakness and difficulty getting up.  He has not had a syncopal episode.  He denies any injury and reports he has just fallen back onto the couch and not to the floor, has not hit his head.  He reports he just in general feels like "COVID is kicking his butt".  He reports he has been coughing but has not been having chest pain or shortness of breath.  He has had some nausea, and after drinking a large amount of water had 1 episode of vomiting but no persistent vomiting or diarrhea.  Patient reports he has not eaten anything since Thursday because nothing sounds good to him and he has not had an appetite, although he is able to eat.  No fevers or chills.  No other aggravating or alleviating factors.  Typically walks with a cane.  The history is provided by the patient and medical records.      Past Medical History:  Diagnosis Date   Aortic atherosclerosis ([place])    2004  s/p  closure Penetrating atherosclerotic ulcer of infarenal aorta w/ endovascular stent graft    Arthritis    CKD (chronic kidney disease), stage III (HCC)    Closed fracture of head of humerus 07/2017   right shoulder from fall; 09-25-2017 per pt no surgical intervention, only wore sling, intermittant pain   Coronary atherosclerosis of native coronary artery    positive myoview for ischemia 09-27-2009;  10-01-2009 per cardiac cath-- minimal nonobstructive CAD w/ 30% LAD    ED (erectile dysfunction)    Emphysema/COPD ([place])    followed by pcp--- last exacerbation 09-12-2017;  09-25-2017 per pt no cough, sob or congestion   Essential hypertension    Family history of kidney cancer    First degree heart block    Frequency of urination    GERD (gastroesophageal reflux disease)    Gout    09-25-2017  per pt stable,  last episode 2015   Heart murmur    History of adenomatous polyp of colon    History of closed head injury 2005   per pt residual resolved   History of external beam radiation therapy    completed 10/ 2015 for prostate cancer   History of gastric ulcer 2014   History of kidney stones    History of pneumothorax    spontaneous pneumo treated w/ chest tube   History of rib fracture 07/2017   from fall,  right side 6th,7th,8th   Neuropathy  OAB (overactive bladder)    OSA (obstructive sleep apnea)    Intolerant of CPAP   Prostate cancer ([place])    dx 09-26-2011 via bx-- Stage T3b,N0,  Gleason 4+4, PSA 11.2 with METs to external iliac lymph node(resolved with ADT)-- treated w/ ADT ;   05/2013 staging work-up for rising PSA , started casodex and completed radiation therapy 10/ 2015;   rising PSA post treatment   RLS (restless legs syndrome)    Ureteral neocystostomy bleed    left side   Urge urinary incontinence     Patient Active Problem List   Diagnosis Date Noted   Incisional hernia, without obstruction or gangrene 19/37/9024   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 01/19/2020   Family history of kidney cancer    Chronic kidney disease (CKD), stage IV (severe)  ([place]) 07/10/2018   Depression, recurrent ([place]) 04/08/2018   Neuropathy 03/17/2015   Prostate cancer ([place]) 06/30/2013   Abdominal aneurysm without mention of rupture 02/19/2013   Morbid obesity ([place]) 01/14/2013   VITAMIN B12 DEFICIENCY 01/14/2013   TOBACCO ABUSE 01/14/2013   PTSD 01/14/2013   COPD (chronic obstructive pulmonary disease) (HCC)    OSA (obstructive sleep apnea) 08/14/2012   RLS (restless legs syndrome) 08/14/2012   Prediabetes 08/14/2012   Aortic atherosclerosis ([place]) 02/19/2012   Coronary atherosclerosis of native coronary artery 03/23/2010   GERD 07/30/2008   History of colonic polyps 07/30/2008   Benign prostatic hyperplasia with lower urinary tract symptoms 02/20/2008   Hyperlipidemia 01/31/2007   GOUT 01/31/2007   Essential hypertension, benign 01/31/2007    Past Surgical History:  Procedure Laterality Date   ABDOMINAL AORTIC ENDOVASCULAR STENT GRAFT  09-13-2007    dr [name]  [facility]   closure penetrating atherosclerotic ulcer of infarenal aorta with stent graft   BIOPSY  03/06/2019   Procedure: BIOPSY;  Surgeon: [name], MD;  Location: [facility];  Service: Endoscopy;;  gastric   CARDIAC CATHETERIZATION  2005   per pt normal (done in [place])   [place]  10/06/2009    dr cooper   minimal nonobstructive CAD w/30% LAD otherwise normal coronaries, LVEDP 65mmHg   CARDIOVASCULAR STRESS TEST  09/27/2009    dr Aundra Dubin   lexiscan nuclear study w/ moderate reversible inferior perfusion defect ischemia,  ef 60% (cardiac cath scheduled)   CATARACT EXTRACTION W/PHACO Right 07/12/2020   Procedure: CATARACT EXTRACTION PHACO AND INTRAOCULAR LENS PLACEMENT RIGHT EYE;  Surgeon: [name], MD;  Location: [facility] OR[facility];  Service: Ophthalmology;  Laterality: Right;  CDE=15.48   CHEST TUBE INSERTION  1989   "collapsed lung"due to injury   CIRCUMCISION  09/26/2011   Procedure: CIRCUMCISION ADULT;  Surgeon: [name], MD;  Location: [facility];  Service: Urology;   Laterality: N/A;   COLONOSCOPY W/ POLYPECTOMY     COLONOSCOPY WITH PROPOFOL N/A 04/27/2016    eight 4-8 mm polyps in descending colon, at splenic flexure, in ascending colon and cecum. Tubular adenomas. Surveillance in 3 years.   CYSTOSCOPY  09/26/2011   Procedure: CYSTOSCOPY;  Surgeon: [name], MD;  Location: [facility];  Service: Urology;  Laterality: N/A;   CYSTOSCOPY/RETROGRADE/URETEROSCOPY Left 09/27/2017   Procedure: CYSTOSCOPYLEFT /RETROGRADE/URETEROSCOPY AND RENAL WASHINGS;  Surgeon: [name], MD;  Location: [facility];  Service: Urology;  Laterality: Left;   ESOPHAGOGASTRODUODENOSCOPY (EGD) WITH PROPOFOL N/A 04/27/2016   normal esophagus s/p dilation, small hiatal hernia, normal duodenum   ESOPHAGOGASTRODUODENOSCOPY (EGD) WITH PROPOFOL N/A 03/06/2019   erosive reflux esophagitis, s/p dilation,  normal duodenum, abnormal gastric mucosa s/p biopsy. Hyperplastic gastric polyp. No H.pylori.    EXTRACORPOREAL SHOCK WAVE LITHOTRIPSY  yrs ago   FRACTURE SURGERY  child   Bilateral lower arms    KNEE ARTHROSCOPY Right 2013   MALONEY DILATION N/A 04/27/2016   Procedure: Venia Minks DILATION;  Surgeon: [name], MD;  [facility];  Service: Endoscopy;  Laterality: N/A;   MALONEY DILATION N/A 03/06/2019   Procedure: Venia Minks DILATION;  Surgeon: [name], MD;  [facility];  Service: Endoscopy;  Laterality: N/A;   PARTIAL KNEE ARTHROPLASTY Right 04/26/2015   Procedure: RIGHT KNEE MEDIAL UNICOMPARTMENTAL ARTHROPLASTY;  Surgeon: Gaynelle Arabian, MD;  Location: [facility];  Service: Orthopedics;  Laterality: Right;   POLYPECTOMY  04/27/2016   Procedure: POLYPECTOMY;  Surgeon: [name], MD;  [facility];  Service: Endoscopy;;  cecal , ascending, descending, and sigmoid polypectomies   PROSTATE BIOPSY  09/26/2011   Procedure: BIOPSY TRANSRECTAL ULTRASONIC PROSTATE (TUBP);  Surgeon: [name], MD;  Location[facility];  Service: Urology;  Laterality:  N/A;      TRANSTHORACIC ECHOCARDIOGRAM  01-07-2013   dr [name]   ef 60-65%,  grade 1 diastolic dysfunction/  mild AR without stenosis/  mild LAE/  mild to moderate calcificed MV annulus without regurg. or stenosis/   UMBILICAL HERNIA REPAIR  yrs ago       Family History  Problem Relation Age of Onset   Stroke Mother    Alzheimer's disease Mother 71       probably due to head injury   Mesothelioma Father 20   Kidney cancer Father    Hyperlipidemia Sister    Heart attack Sister    Kidney cancer Sister        dx in her 76s   Heart disease Brother    Kidney cancer Brother 36       dx in his 66s   Hyperlipidemia Brother    Kidney cancer Brother        dx in his 55s   Healthy Daughter    Healthy Son    Healthy Son    Colon cancer Neg Hx     Social History   Tobacco Use   Smoking status: Every Day    Packs/day: 0.50    Years: 42.00    Pack years: 21.00    Types: Cigarettes   Smokeless tobacco: Never  Vaping Use   Vaping Use: Never used  Substance Use Topics   Alcohol use: No    Alcohol/week: 0.0 standard drinks   Drug use: No    Home Medications Prior to Admission medications   Medication Sig Start Date End Date Taking? Authorizing Provider  albuterol (VENTOLIN HFA) 108 (90 Base) MCG/ACT inhaler Inhale 1-2 puffs into the lungs every 6 (six) hours as needed for wheezing or shortness of breath. 02/06/21  Yes Kathe Becton R, PA-C  allopurinol (ZYLOPRIM) 300 MG tablet Take 1 tablet by mouth once daily 01/12/21  Yes [name], [name], MD  amLODipine (NORVASC) 5 MG tablet Take 1 tablet (5 mg total) by mouth daily. 10/14/20  Yes [name], [name], MD  carvedilol (COREG) 25 MG tablet Take 1 tablet by mouth twice daily 01/12/21  Yes [name], [name], MD  DULoxetine (CYMBALTA) 60 MG capsule Take 1 capsule (60 mg total) by mouth daily. Take 1 capsule by mouth once daily (Needs to be seen before next refill) 05/19/20  Yes [name], [name], MD  losartan (COZAAR) 25 MG  tablet Take 1 tablet (25 mg total) by mouth daily. 10/14/20  Yes [name], [name], MD  molnupiravir EUA (LAGEVRIO) 200 mg CAPS capsule Take 4 capsules (800 mg total) by mouth 2 (two) times daily for 5 days. 02/06/21 02/11/21 Yes Kathe Becton R, PA-C  pantoprazole (PROTONIX) 40 MG tablet TAKE 1 TABLET BY MOUTH TWICE DAILY BEFORE A MEAL 01/12/21  Yes Gwenlyn Perking, FNP  potassium chloride SA (KLOR-CON) 20 MEQ tablet Take 1 tablet by mouth twice daily 01/12/21  Yes Pennington, Rebekah M, PA-C  rosuvastatin (CRESTOR) 20 MG tablet TAKE 1 TABLET BY MOUTH ONCE DAILY -  STOP  PRAVASTATIN -- NEEDS FOLLOW UP APPOINTMENT FOR REFILLS 09/30/20  Yes Satira Sark, MD  tamsulosin (FLOMAX) 0.4 MG CAPS capsule Take 1 capsule (0.4 mg total) by mouth daily. 04/29/20  Yes [name], MD  traMADol (ULTRAM) 50 MG tablet Take 1 tablet (50 mg total) by mouth daily as needed. 05/19/20  Yes [name], [name], MD  abiraterone acetate (ZYTIGA) 250 MG tablet Take 2 tablets (500 mg total) by mouth daily. Take on an empty stomach 1 hour before or 2 hours after a meal Patient not taking: No sig reported 10/27/20   Heath Lark, MD  fluconazole (DIFLUCAN) 150 MG tablet Take 1 tablet (150 mg total) by mouth once a week. Patient not taking: Reported on 02/08/2021 12/08/20   [name], [name], MD  HYDROcodone-acetaminophen (NORCO/VICODIN) 5-325 MG tablet Take 1 tablet by mouth every 6 (six) hours as needed. Patient not taking: No sig reported 11/18/20   Evalee Jefferson, PA-C  lidocaine (LIDODERM) 5 % Place 1 patch onto the skin daily. Remove & Discard patch within 12 hours or as directed by MD Patient not taking: No sig reported 11/18/20   Evalee Jefferson, PA-C  nystatin (MYCOSTATIN/NYSTOP) powder Apply 1 application topically 3 (three) times daily. Patient not taking: Reported on 02/08/2021 04/19/20   [name], [name], MD  ondansetron (ZOFRAN ODT) 4 MG disintegrating tablet Take 1 tablet (4 mg total) by mouth every 8 (eight) hours as  needed for nausea or vomiting. Patient not taking: Reported on 02/08/2021 02/06/21   Tonye Pearson, PA-C  pregabalin (LYRICA) 25 MG capsule Take 1 capsule by mouth twice daily Patient not taking: Reported on 02/08/2021 05/04/20   Derek Jack, MD    Allergies    Carbidopa-levodopa, Morphine sulfate, Sulfa antibiotics, and Sulfacetamide sodium  Review of Systems   Review of Systems  Constitutional:  Positive for appetite change and fatigue. Negative for chills and fever.  HENT: Negative.    Respiratory:  Positive for cough. Negative for shortness of breath.   Cardiovascular:  Negative for chest pain.  Gastrointestinal:  Positive for nausea. Negative for abdominal pain, diarrhea and vomiting.  Genitourinary:  Negative for dysuria.  Musculoskeletal:  Negative for myalgias.  Skin:  Negative for color change and rash.  Neurological:  Positive for weakness (Generalized). Negative for syncope, light-headedness and numbness.  All other systems reviewed and are negative.  Physical Exam Updated Vital Signs BP (!) 186/87   Pulse 60   Temp 97.6 F (36.4 C) (Oral)   Resp 18   SpO2 98%   Physical Exam Vitals and nursing note reviewed.  Constitutional:      General: He is not in acute distress.    Appearance: Normal appearance. He is well-developed. He is not ill-appearing or diaphoretic.     Comments: Elderly gentleman, alert, well-appearing and in no acute distress.  HENT:     Head: Normocephalic and atraumatic.     Mouth/Throat:     Mouth: Mucous membranes are moist.     Pharynx: Oropharynx is clear.  Eyes:     General:        Right eye: No discharge.        Left eye: No discharge.     Pupils: Pupils are equal, round, and reactive to light.  Cardiovascular:     Rate and Rhythm: Normal rate and regular rhythm.     Pulses: Normal pulses.     Heart sounds: Normal heart sounds.  Pulmonary:     Effort: Pulmonary effort is normal. No respiratory distress.     Breath sounds:  Normal breath sounds. No wheezing or rales.     Comments: Respirations equal and unlabored, patient able to speak in full sentences, lungs clear to auscultation bilaterally  Abdominal:     General: Bowel sounds are normal. There is no distension.     Palpations: Abdomen is soft. There is no mass.     Tenderness: There is no abdominal tenderness. There is no guarding.     Comments: Abdomen soft, nondistended, nontender to palpation in all quadrants without guarding or peritoneal signs  Musculoskeletal:        General: No deformity.     Cervical back: Neck supple.  Skin:    General: Skin is warm and dry.     Capillary Refill: Capillary refill takes less than 2 seconds.  Neurological:     Mental Status: He is alert and oriented to person, place, and time.     Coordination: Coordination normal.     Comments: Speech is clear, able to follow commands CN III-XII intact Normal strength in upper and lower extremities bilaterally including dorsiflexion and plantar flexion, strong and equal grip strength Sensation normal to light and sharp touch Moves extremities without ataxia, coordination intact  Psychiatric:        Mood and Affect: Mood normal.        Behavior: Behavior normal.    ED Results / Procedures / Treatments   Labs (all labs ordered are listed, but only abnormal results are displayed) Labs Reviewed  COMPREHENSIVE METABOLIC PANEL - Abnormal; Notable for the following components:      Result Value   Potassium 3.4 (*)    Glucose, Bld 102 (*)    Creatinine, Ser 2.30 (*)    Calcium 7.9 (*)    GFR, Estimated 29 (*)    All other components within normal limits  CBC WITH DIFFERENTIAL/PLATELET - Abnormal; Notable for the following components:   WBC 3.5 (*)    RBC 3.56 (*)    Hemoglobin 10.7 (*)    HCT 31.6 (*)    Platelets 114 (*)    Lymphs Abs 0.4 (*)    All other components within normal limits    EKG EKG Interpretation  Date/Time:  Tuesday February 08 2021 14:05:57  EST Ventricular Rate:  60 PR Interval:  252 QRS Duration: 120 QT Interval:  465 QTC Calculation: 465 R Axis:   -58 Text Interpretation: Sinus rhythm Prolonged PR interval Left anterior fascicular block Nonspecific T abnormalities, diffuse leads Confirmed by Sherwood Gambler [phone]) on 02/08/2021 2:11:42 PM  Radiology DG Chest Port 1 View  Result Date: 02/08/2021 CLINICAL DATA:  COVID, shortness of breath EXAM: PORTABLE CHEST 1 VIEW COMPARISON:  02/06/2021 FINDINGS: Cardiomegaly. Mild, diffuse, interstitial opacity increased compared to prior examination the visualized skeletal structures are unremarkable. IMPRESSION: Cardiomegaly  with mild, diffuse, interstitial opacity increased compared to prior examination, which may reflect edema or atypical/viral infection. No focal airspace opacity. Electronically Signed   By: Delanna Ahmadi M.D.   On: 02/08/2021 14:27    Procedures Procedures   Medications Ordered in ED Medications  potassium chloride SA (KLOR-CON) CR tablet 40 mEq (40 mEq Oral Not Given 02/08/21 1527)  sodium chloride 0.9 % bolus 1,000 mL (0 mLs Intravenous Stopped 02/08/21 1712)  acetaminophen (TYLENOL) tablet 1,000 mg (1,000 mg Oral Given 02/08/21 1421)  potassium chloride SA (KLOR-CON) CR tablet 40 mEq (40 mEq Oral Given 02/08/21 1525)    ED Course  I have reviewed the triage vital signs and the nursing notes.  Pertinent labs & imaging results that were available during my care of the patient were reviewed by me and considered in my medical decision making (see chart for details).    MDM Rules/Calculators/A&P                           75 year old gentleman presents with generalized weakness, was diagnosed with COVID 2 days ago, reports and symptoms started he has had no appetite and has not had anything to eat but has been trying to drink fluids.  He has not been vomiting.  Reports no worsening of shortness of breath or chest pain.  He reports he is fallen back onto his couch  when trying to get up a few times over the past 2 days.  He was prescribed molnupiravir, Zofran and albuterol for treatment but did not pick up any of these medications until today and has not taken any of them.  On arrival patient is hypertensive but otherwise vitals are stable.  He reports generalized weakness but has no focal neurologic deficits on exam.  Lungs are clear and he is satting at 98% on room air.  We will check basic labs and repeat chest x-ray as well as EKG.  IV fluids given.  Then will attempt p.o. challenge and ambulation if work-up is reassuring.  Stressed with patient the importance of taking medications that are prescribed to him for treatment of COVID infection as this will likely improve his weakness and symptoms.  I have independently ordered, reviewed and interpreted all labs and imaging: CBC: Mild leukopenia, hemoglobin is stable CMP: Very mild hypokalemia of 3.4, p.o. potassium replacement given.  Patient with CKD with creatinine of 2.30 which is at baseline, mild hypocalcemia, no other electrolyte derangements, normal LFTs  Chest x-ray with mild diffuse interstitial opacities increased from prior exam, suspect this is likely COVID-pneumonia.  Patient does not have rales on exam or signs of fluid overload to suggest pulmonary edema.  After fluids and medications patient has been tolerating p.o. here in the ED and was able to ambulate in the department using his cane without assistance.  He maintained O2 sats > 96% with ambulation.  Again stressed the importance of using medications that patient picked up today to treat his symptoms but at this point feel he is stable and appropriate for continued outpatient treatment.  Stressed the importance of eating and drinking regularly and discussed appropriate return precautions.  Patient expresses understanding and agreement.  Discharged home in good condition.  Final Clinical Impression(s) / ED Diagnoses Final diagnoses:  COVID-19  virus infection  Generalized weakness    Rx / DC Orders ED Discharge Orders     None        Jacqlyn Larsen, [place]  02/08/21 [place], MD 02/10/21 [phone]

## 2021-02-15 ENCOUNTER — Encounter (HOSPITAL_COMMUNITY): Payer: Self-pay | Admitting: Emergency Medicine

## 2021-02-15 ENCOUNTER — Emergency Department (HOSPITAL_COMMUNITY): Payer: Medicare Other

## 2021-02-15 ENCOUNTER — Emergency Department (HOSPITAL_COMMUNITY)
Admission: EM | Admit: 2021-02-15 | Discharge: 2021-02-16 | Disposition: A | Discharge status: Home / Self Care | Payer: Medicare Other | Attending: Emergency Medicine | Admitting: Emergency Medicine

## 2021-02-15 DIAGNOSIS — Z79899 Other long term (current) drug therapy: Secondary | ICD-10-CM | POA: Insufficient documentation

## 2021-02-15 DIAGNOSIS — I129 Hypertensive chronic kidney disease with stage 1 through stage 4 chronic kidney disease, or unspecified chronic kidney disease: Secondary | ICD-10-CM | POA: Diagnosis not present

## 2021-02-15 DIAGNOSIS — Z96651 Presence of right artificial knee joint: Secondary | ICD-10-CM | POA: Diagnosis not present

## 2021-02-15 DIAGNOSIS — I2510 Atherosclerotic heart disease of native coronary artery without angina pectoris: Secondary | ICD-10-CM | POA: Insufficient documentation

## 2021-02-15 DIAGNOSIS — F17210 Nicotine dependence, cigarettes, uncomplicated: Secondary | ICD-10-CM | POA: Insufficient documentation

## 2021-02-15 DIAGNOSIS — R319 Hematuria, unspecified: Secondary | ICD-10-CM | POA: Insufficient documentation

## 2021-02-15 DIAGNOSIS — N184 Chronic kidney disease, stage 4 (severe): Secondary | ICD-10-CM | POA: Insufficient documentation

## 2021-02-15 DIAGNOSIS — Z8546 Personal history of malignant neoplasm of prostate: Secondary | ICD-10-CM | POA: Diagnosis not present

## 2021-02-15 DIAGNOSIS — L304 Erythema intertrigo: Secondary | ICD-10-CM | POA: Diagnosis not present

## 2021-02-15 DIAGNOSIS — J449 Chronic obstructive pulmonary disease, unspecified: Secondary | ICD-10-CM | POA: Insufficient documentation

## 2021-02-15 DIAGNOSIS — Z8616 Personal history of COVID-19: Secondary | ICD-10-CM | POA: Insufficient documentation

## 2021-02-15 LAB — URINALYSIS, ROUTINE W REFLEX MICROSCOPIC
Bilirubin Urine: NEGATIVE
Glucose, UA: NEGATIVE mg/dL
Ketones, ur: NEGATIVE mg/dL
Leukocytes,Ua: NEGATIVE
Nitrite: NEGATIVE
Protein, ur: >=300 mg/dL — AB
RBC / HPF: >50 RBC/hpf — ABNORMAL HIGH (ref 0–5)
Specific Gravity, Urine: 1.016 (ref 1.005–1.030)
WBC, UA: >50 WBC/hpf — ABNORMAL HIGH (ref 0–5)
pH: 6 (ref 5.0–8.0)

## 2021-02-15 MED ORDER — FLUCONAZOLE 100 MG PO TABS: 100.0000 mg | ORAL_TABLET | Freq: Every day | ORAL | 0 refills | Status: DC

## 2021-02-15 MED ORDER — NYSTATIN 100000 UNIT/GM EX CREA: TOPICAL_CREAM | CUTANEOUS | 2 refills | Status: DC

## 2021-02-15 MED ORDER — CEPHALEXIN 500 MG PO CAPS: 500.0000 mg | ORAL_CAPSULE | Freq: Three times a day (TID) | ORAL | 0 refills | Status: AC

## 2021-02-15 NOTE — ED Notes (Signed)
Got pt cleaned up ( did peri care ) to get a clean urine sample and pt couldn't go. Nurse notified.

## 2021-02-15 NOTE — ED Provider Notes (Signed)
[facility] EMERGENCY DEPARTMENT Provider Note   CSN: [id number] Arrival date & time: 02/15/21  2110     History Chief Complaint  Patient presents with   Hematuria    [name] is a 75 y.o. male.   Hematuria  This patient is a 75 year old male, he has a history of chronic kidney disease, he has a history of hypertension and in fact was recently seen in the emergency department twice because of COVID-19 however he has fully improved and has no residual symptoms of that.  He notes that over the last couple of days he has had some hematuria, he has had some blood clots from his penis tonight and has been able to urinate and pass his urine but is passing some clots as well.  The patient has no abdominal pain no fevers or chills, no nausea or vomiting, he has not had a bowel movement in the last week either.  A quick view of the patient's medications show that he is not anticoagulated    Past Medical History:  Diagnosis Date   Aortic atherosclerosis ([place])    2004  s/p  closure Penetrating atherosclerotic ulcer of infarenal aorta w/ endovascular stent graft   Arthritis    CKD (chronic kidney disease), stage III ([place])    Closed fracture of head of humerus 07/2017   right shoulder from fall; 09-25-2017 per pt no surgical intervention, only wore sling, intermittant pain   Coronary atherosclerosis of native coronary artery    positive myoview for ischemia 09-27-2009;  10-01-2009 per cardiac cath-- minimal nonobstructive CAD w/ 30% LAD    ED (erectile dysfunction)    Emphysema/COPD ([place])    followed by pcp--- last exacerbation 09-12-2017;  09-25-2017 per pt no cough, sob or congestion   Essential hypertension    Family history of kidney cancer    First degree heart block    Frequency of urination    GERD (gastroesophageal reflux disease)    Gout    09-25-2017  per pt stable,  last episode 2015   Heart murmur    History of adenomatous polyp of colon    History of closed  head injury 2005   per pt residual resolved   History of external beam radiation therapy    completed 10/ 2015 for prostate cancer   History of gastric ulcer 2014   History of kidney stones    History of pneumothorax    spontaneous pneumo treated w/ chest tube   History of rib fracture 07/2017   from fall,  right side 6th,7th,8th   Neuropathy    OAB (overactive bladder)    OSA (obstructive sleep apnea)    Intolerant of CPAP   Prostate cancer ([place])    dx 09-26-2011 via bx-- Stage T3b,N0,  Gleason 4+4, PSA 11.2 with METs to external iliac lymph node(resolved with ADT)-- treated w/ ADT ;   05/2013 staging work-up for rising PSA , started casodex and completed radiation therapy 10/ 2015;   rising PSA post treatment   RLS (restless legs syndrome)    Ureteral neocystostomy bleed    left side   Urge urinary incontinence     Patient Active Problem List   Diagnosis Date Noted   Incisional hernia, without obstruction or gangrene 76/73/4193   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 01/19/2020   Family history of kidney cancer    Chronic kidney disease (CKD), stage IV (severe) ([place]) 07/10/2018   Depression, recurrent ([place]) 04/08/2018   Neuropathy 03/17/2015  Prostate cancer ([place]) 06/30/2013   Abdominal aneurysm without mention of rupture 02/19/2013   Morbid obesity ([place]) 01/14/2013   VITAMIN B12 DEFICIENCY 01/14/2013   TOBACCO ABUSE 01/14/2013   PTSD 01/14/2013   COPD (chronic obstructive pulmonary disease) ([facility])    OSA (obstructive sleep apnea) 08/14/2012   RLS (restless legs syndrome) 08/14/2012   Prediabetes 08/14/2012   Aortic atherosclerosis ([place]) 02/19/2012   Coronary atherosclerosis of native coronary artery 03/23/2010   GERD 07/30/2008   History of colonic polyps 07/30/2008   Benign prostatic hyperplasia with lower urinary tract symptoms 02/20/2008   Hyperlipidemia 01/31/2007   GOUT 01/31/2007   Essential hypertension, benign 01/31/2007    Past Surgical History:   Procedure Laterality Date   ABDOMINAL AORTIC ENDOVASCULAR STENT GRAFT  09-13-2007    dr [name]  [facility]   closure penetrating atherosclerotic ulcer of infarenal aorta with stent graft   BIOPSY  03/06/2019   Procedure: BIOPSY;  Surgeon: [name], MD;  Location: [facility];  Service: Endoscopy;;  gastric   CARDIAC CATHETERIZATION  2005   per pt normal (done in [place])   [place]  10/06/2009    dr cooper   minimal nonobstructive CAD w/30% LAD otherwise normal coronaries, LVEDP 65mmHg   CARDIOVASCULAR STRESS TEST  09/27/2009    dr Aundra Dubin   lexiscan nuclear study w/ moderate reversible inferior perfusion defect ischemia,  ef 60% (cardiac cath scheduled)   CATARACT EXTRACTION W/PHACO Right 07/12/2020   Procedure: CATARACT EXTRACTION PHACO AND INTRAOCULAR LENS PLACEMENT RIGHT EYE;  Surgeon: [name], MD;  Location: [facility] OR[facility];  Service: Ophthalmology;  Laterality: Right;  CDE=15.48   CHEST TUBE INSERTION  1989   "collapsed lung"due to injury   CIRCUMCISION  09/26/2011   Procedure: CIRCUMCISION ADULT;  Surgeon: [name], MD;  Location: [facility];  Service: Urology;  Laterality: N/A;   COLONOSCOPY W/ POLYPECTOMY     COLONOSCOPY WITH PROPOFOL N/A 04/27/2016    eight 4-8 mm polyps in descending colon, at splenic flexure, in ascending colon and cecum. Tubular adenomas. Surveillance in 3 years.   CYSTOSCOPY  09/26/2011   Procedure: CYSTOSCOPY;  Surgeon: [name], MD;  Location: [facility];  Service: Urology;  Laterality: N/A;   CYSTOSCOPY/RETROGRADE/URETEROSCOPY Left 09/27/2017   Procedure: CYSTOSCOPYLEFT /RETROGRADE/URETEROSCOPY AND RENAL WASHINGS;  Surgeon: [name], MD;  Location: [facility];  Service: Urology;  Laterality: Left;   ESOPHAGOGASTRODUODENOSCOPY (EGD) WITH PROPOFOL N/A 04/27/2016   normal esophagus s/p dilation, small hiatal hernia, normal duodenum   ESOPHAGOGASTRODUODENOSCOPY (EGD) WITH PROPOFOL N/A 03/06/2019   erosive reflux esophagitis, s/p  dilation, normal duodenum, abnormal gastric mucosa s/p biopsy. Hyperplastic gastric polyp. No H.pylori.    EXTRACORPOREAL SHOCK WAVE LITHOTRIPSY  yrs ago   FRACTURE SURGERY  child   Bilateral lower arms    KNEE ARTHROSCOPY Right 2013   MALONEY DILATION N/A 04/27/2016   Procedure: Venia Minks DILATION;  Surgeon: [name], MD;  [facility];  Service: Endoscopy;  Laterality: N/A;   MALONEY DILATION N/A 03/06/2019   Procedure: Venia Minks DILATION;  Surgeon: [name], MD;  [facility];  Service: Endoscopy;  Laterality: N/A;   PARTIAL KNEE ARTHROPLASTY Right 04/26/2015   Procedure: RIGHT KNEE MEDIAL UNICOMPARTMENTAL ARTHROPLASTY;  Surgeon: Gaynelle Arabian, MD;  Location: [facility];  Service: Orthopedics;  Laterality: Right;   POLYPECTOMY  04/27/2016   Procedure: POLYPECTOMY;  Surgeon: [name], MD;  [facility];  Service: Endoscopy;;  cecal , ascending, descending,  and sigmoid polypectomies   PROSTATE BIOPSY  09/26/2011   Procedure: BIOPSY TRANSRECTAL ULTRASONIC PROSTATE (TUBP);  Surgeon: [name], MD;  Location: [facility];  Service: Urology;  Laterality: N/A;      TRANSTHORACIC ECHOCARDIOGRAM  01-07-2013   dr [name]   ef 60-65%,  grade 1 diastolic dysfunction/  mild AR without stenosis/  mild LAE/  mild to moderate calcificed MV annulus without regurg. or stenosis/   UMBILICAL HERNIA REPAIR  yrs ago       Family History  Problem Relation Age of Onset   Stroke Mother    Alzheimer's disease Mother 76       probably due to head injury   Mesothelioma Father 25   Kidney cancer Father    Hyperlipidemia Sister    Heart attack Sister    Kidney cancer Sister        dx in her 107s   Heart disease Brother    Kidney cancer Brother 36       dx in his 70s   Hyperlipidemia Brother    Kidney cancer Brother        dx in his 64s   Healthy Daughter    Healthy Son    Healthy Son    Colon cancer Neg Hx     Social History   Tobacco Use   Smoking status:  Every Day    Packs/day: 0.50    Years: 42.00    Pack years: 21.00    Types: Cigarettes   Smokeless tobacco: Never  Vaping Use   Vaping Use: Never used  Substance Use Topics   Alcohol use: No    Alcohol/week: 0.0 standard drinks   Drug use: No    Home Medications Prior to Admission medications   Medication Sig Start Date End Date Taking? Authorizing Provider  cephALEXin (KEFLEX) 500 MG capsule Take 1 capsule (500 mg total) by mouth 3 (three) times daily for 7 days. 02/15/21 02/22/21 Yes Noemi Chapel, MD  fluconazole (DIFLUCAN) 100 MG tablet Take 1 tablet (100 mg total) by mouth daily for 14 days. 02/15/21 03/01/21 Yes Noemi Chapel, MD  nystatin cream (MYCOSTATIN) Apply to affected area 2 times daily 02/15/21  Yes Noemi Chapel, MD  abiraterone acetate (ZYTIGA) 250 MG tablet Take 2 tablets (500 mg total) by mouth daily. Take on an empty stomach 1 hour before or 2 hours after a meal Patient not taking: No sig reported 10/27/20   Heath Lark, MD  albuterol (VENTOLIN HFA) 108 (90 Base) MCG/ACT inhaler Inhale 1-2 puffs into the lungs every 6 (six) hours as needed for wheezing or shortness of breath. 02/06/21   Tonye Pearson, PA-C  allopurinol (ZYLOPRIM) 300 MG tablet Take 1 tablet by mouth once daily 01/12/21   [name], [name], MD  amLODipine (NORVASC) 5 MG tablet Take 1 tablet (5 mg total) by mouth daily. 10/14/20   [name], [name], MD  carvedilol (COREG) 25 MG tablet Take 1 tablet by mouth twice daily 01/12/21   [name], [name], MD  DULoxetine (CYMBALTA) 60 MG capsule Take 1 capsule (60 mg total) by mouth daily. Take 1 capsule by mouth once daily (Needs to be seen before next refill) 05/19/20   [name], [name], MD  losartan (COZAAR) 25 MG tablet Take 1 tablet (25 mg total) by mouth daily. 10/14/20   [name], [name], MD  pantoprazole (PROTONIX) 40 MG tablet TAKE 1 TABLET BY MOUTH TWICE DAILY BEFORE A MEAL 01/12/21   Gwenlyn Perking, FNP  potassium chloride SA (KLOR-CON)  20 MEQ tablet Take 1 tablet by mouth twice daily 01/12/21   Tarri Abernethy M, PA-C  rosuvastatin (CRESTOR) 20 MG tablet TAKE 1 TABLET BY MOUTH ONCE DAILY -  STOP  PRAVASTATIN -- NEEDS FOLLOW UP APPOINTMENT FOR REFILLS 09/30/20   Satira Sark, MD  tamsulosin (FLOMAX) 0.4 MG CAPS capsule Take 1 capsule (0.4 mg total) by mouth daily. 04/29/20   [name], MD  traMADol (ULTRAM) 50 MG tablet Take 1 tablet (50 mg total) by mouth daily as needed. 05/19/20   [name], [name], MD    Allergies    Carbidopa-levodopa, Morphine sulfate, Sulfa antibiotics, and Sulfacetamide sodium  Review of Systems   Review of Systems  Constitutional:  Negative for fever.  Genitourinary:  Positive for hematuria.  Skin:  Positive for rash.   Physical Exam Updated Vital Signs BP 134/90 (BP Location: Right Arm)   Pulse 84   Temp 97.7 F (36.5 C) (Oral)   Resp 18   Ht 1.702 m (5\' 7" )   Wt 113.8 kg   SpO2 98%   BMI 39.29 kg/m   Physical Exam Vitals and nursing note reviewed.  Constitutional:      General: He is not in acute distress.    Appearance: He is well-developed.  HENT:     Head: Normocephalic and atraumatic.     Mouth/Throat:     Pharynx: No oropharyngeal exudate.  Eyes:     General: No scleral icterus.       Right eye: No discharge.        Left eye: No discharge.     Conjunctiva/sclera: Conjunctivae normal.     Pupils: Pupils are equal, round, and reactive to light.  Neck:     Thyroid: No thyromegaly.     Vascular: No JVD.  Cardiovascular:     Rate and Rhythm: Normal rate and regular rhythm.     Heart sounds: Normal heart sounds. No murmur heard.   No friction rub. No gallop.  Pulmonary:     Effort: Pulmonary effort is normal. No respiratory distress.     Breath sounds: Normal breath sounds. No wheezing or rales.  Abdominal:     General: Bowel sounds are normal. There is no distension.     Palpations: Abdomen is soft. There is no mass.     Tenderness: There is no abdominal  tenderness.  Genitourinary:    Comments: There appears to be candidal intertrigo in the bilateral inguinal regions of the skin folds as well as around the penis, the foreskin was retracted to reveal a normal-appearing corona, there is no blood at the urethral meatus Musculoskeletal:        General: No tenderness. Normal range of motion.     Cervical back: Normal range of motion and neck supple.     Right lower leg: No edema.     Left lower leg: No edema.  Lymphadenopathy:     Cervical: No cervical adenopathy.  Skin:    General: Skin is warm and dry.     Findings: Rash present. No erythema.  Neurological:     Mental Status: He is alert.     Coordination: Coordination normal.  Psychiatric:        Behavior: Behavior normal.    ED Results / Procedures / Treatments   Labs (all labs ordered are listed, but only abnormal results are displayed) Labs Reviewed  URINE CULTURE  URINALYSIS, ROUTINE W REFLEX MICROSCOPIC    EKG None  Radiology No results found.  Procedures Procedures   Medications Ordered in ED Medications - No data to display  ED Course  I have reviewed the triage vital signs and the nursing notes.  Pertinent labs & imaging results that were available during my care of the patient were reviewed by me and considered in my medical decision making (see chart for details).    MDM Rules/Calculators/A&P                           The patient has some hematuria, will get a clean urine sample, the patient will also need more aggressive treatment for his yeast intertrigo, it appears that he was placed on Diflucan by his family doctor a couple of months ago but he states it never helps and this is been a chronic problem for him.  Without any treatment with anticoagulants, would be concerned that there could be cystitis, will get urine culture and treat with presumptive antibiotic therapy.  At change of shift - care signed out to Dr. Roderic Palau to follow up results and  disposition accordingly.  Final Clinical Impression(s) / ED Diagnoses Final diagnoses:  Hematuria, unspecified type  Intertrigo    Rx / DC Orders ED Discharge Orders          Ordered    nystatin cream (MYCOSTATIN)        02/15/21 2146    fluconazole (DIFLUCAN) 100 MG tablet  Daily        02/15/21 2146    cephALEXin (KEFLEX) 500 MG capsule  3 times daily        02/15/21 2146             Noemi Chapel, MD 02/15/21 2200

## 2021-02-15 NOTE — Discharge Instructions (Signed)
Your testing shows that you have some blood in your urine This may be related to infection - take collection 3 times daily for the next 7 days  I would also like for you to apply the topical nystatin ointment to the areas of rash in your groin and around your penis, you may do this 3 times daily, you would also need to take the fluconazole 1 tablet a day for the next 14 days  Please follow-up with your family doctor for recheck, I have referred you to a urologist so that if the bleeding continues you may follow-up with them.  Please call in the morning for the next billable appointment  Emergency department for severe worsening symptoms including the inability to urinate or severe abdominal or penile pain

## 2021-02-15 NOTE — ED Triage Notes (Signed)
Pt c/o hematuria since last week. Pt states now he is passing blood clots.

## 2021-02-15 NOTE — ED Notes (Signed)
Pt given water and banana upon request

## 2021-02-16 DIAGNOSIS — R319 Hematuria, unspecified: Secondary | ICD-10-CM | POA: Diagnosis not present

## 2021-02-16 MED ORDER — LIDOCAINE HCL (PF) 1 % IJ SOLN
INTRAMUSCULAR | Status: AC
  Administered 2021-02-16: 2.1 mL
  Filled 2021-02-16: qty 30

## 2021-02-16 MED ORDER — CEFTRIAXONE SODIUM 1 G IJ SOLR
1.0000 g | Freq: Once | INTRAMUSCULAR | Status: AC
  Administered 2021-02-16: 1 g via INTRAMUSCULAR
  Filled 2021-02-16: qty 10

## 2021-02-16 MED ORDER — POLYETHYLENE GLYCOL 3350 17 GM/SCOOP PO POWD: 17.0000 g | Freq: Every day | ORAL | 0 refills | Status: DC

## 2021-02-16 NOTE — ED Provider Notes (Signed)
  Physical Exam  BP 134/90 (BP Location: Right Arm)   Pulse 84   Temp 97.7 F (36.5 C) (Oral)   Resp 18   Ht 5\' 7"  (1.702 m)   Wt 113.8 kg   SpO2 98%   BMI 39.29 kg/m   Physical Exam Vitals and nursing note reviewed.  Constitutional:      General: He is not in acute distress.    Appearance: Normal appearance. He is not ill-appearing.  HENT:     Head: Normocephalic and atraumatic.  Pulmonary:     Effort: Pulmonary effort is normal.  Abdominal:     General: There is no distension.     Tenderness: There is no abdominal tenderness.  Skin:    General: Skin is warm and dry.  Neurological:     Mental Status: He is alert.    ED Course/Procedures     Procedures  MDM  Patient presenting here with complaints of hematuria.  This has been worsening since last week.  He denies any fevers or chills.  He does have a history of prostate cancer being treated by Dr. Delton Coombes.  Care signed out to me awaiting results of urinalysis.  This shows gross hematuria with greater than 50 white cells.  Patient will be treated for presumed UTI with Rocephin and discharged with Keflex.  I did obtain a CT scan to rule out alternate pathology.  This did not show any acute abnormality otherwise, but there was some thickening of the urinary bladder consistent with possible cystitis.       Veryl Speak, MD 02/16/21 [phone]

## 2021-02-17 LAB — URINE CULTURE: Culture: <10000 — AB

## 2021-02-21 ENCOUNTER — Other Ambulatory Visit: Payer: Self-pay

## 2021-02-21 ENCOUNTER — Ambulatory Visit: Payer: Medicare Other | Admitting: Family Medicine

## 2021-02-22 ENCOUNTER — Encounter: Payer: Self-pay | Admitting: Family Medicine

## 2021-02-23 ENCOUNTER — Observation Stay (HOSPITAL_COMMUNITY): Payer: Medicare Other

## 2021-02-23 ENCOUNTER — Emergency Department (HOSPITAL_COMMUNITY): Payer: Medicare Other

## 2021-02-23 ENCOUNTER — Ambulatory Visit: Payer: Medicare Other | Admitting: Family Medicine

## 2021-02-23 ENCOUNTER — Other Ambulatory Visit: Payer: Self-pay

## 2021-02-23 ENCOUNTER — Inpatient Hospital Stay (HOSPITAL_COMMUNITY)
Admission: EM | Admit: 2021-02-23 | Discharge: 2021-02-25 | DRG: 312 | Disposition: A | Discharge status: Home Health | Payer: Medicare Other | Attending: Family Medicine | Admitting: Family Medicine

## 2021-02-23 ENCOUNTER — Encounter (HOSPITAL_COMMUNITY): Payer: Self-pay

## 2021-02-23 DIAGNOSIS — G9341 Metabolic encephalopathy: Secondary | ICD-10-CM | POA: Diagnosis present

## 2021-02-23 DIAGNOSIS — J449 Chronic obstructive pulmonary disease, unspecified: Secondary | ICD-10-CM | POA: Diagnosis present

## 2021-02-23 DIAGNOSIS — I609 Nontraumatic subarachnoid hemorrhage, unspecified: Secondary | ICD-10-CM

## 2021-02-23 DIAGNOSIS — E860 Dehydration: Secondary | ICD-10-CM | POA: Diagnosis not present

## 2021-02-23 DIAGNOSIS — I129 Hypertensive chronic kidney disease with stage 1 through stage 4 chronic kidney disease, or unspecified chronic kidney disease: Secondary | ICD-10-CM | POA: Diagnosis present

## 2021-02-23 DIAGNOSIS — I700 Atherosclerosis of aorta: Secondary | ICD-10-CM | POA: Diagnosis present

## 2021-02-23 DIAGNOSIS — C61 Malignant neoplasm of prostate: Secondary | ICD-10-CM | POA: Diagnosis present

## 2021-02-23 DIAGNOSIS — I10 Essential (primary) hypertension: Secondary | ICD-10-CM

## 2021-02-23 DIAGNOSIS — R296 Repeated falls: Secondary | ICD-10-CM | POA: Diagnosis present

## 2021-02-23 DIAGNOSIS — F17200 Nicotine dependence, unspecified, uncomplicated: Secondary | ICD-10-CM

## 2021-02-23 DIAGNOSIS — R42 Dizziness and giddiness: Secondary | ICD-10-CM | POA: Diagnosis not present

## 2021-02-23 DIAGNOSIS — E1122 Type 2 diabetes mellitus with diabetic chronic kidney disease: Secondary | ICD-10-CM | POA: Diagnosis present

## 2021-02-23 DIAGNOSIS — Z888 Allergy status to other drugs, medicaments and biological substances status: Secondary | ICD-10-CM

## 2021-02-23 DIAGNOSIS — U071 COVID-19: Secondary | ICD-10-CM | POA: Diagnosis not present

## 2021-02-23 DIAGNOSIS — R001 Bradycardia, unspecified: Secondary | ICD-10-CM | POA: Diagnosis not present

## 2021-02-23 DIAGNOSIS — Z923 Personal history of irradiation: Secondary | ICD-10-CM

## 2021-02-23 DIAGNOSIS — Z79891 Long term (current) use of opiate analgesic: Secondary | ICD-10-CM

## 2021-02-23 DIAGNOSIS — F339 Major depressive disorder, recurrent, unspecified: Secondary | ICD-10-CM

## 2021-02-23 DIAGNOSIS — K219 Gastro-esophageal reflux disease without esophagitis: Secondary | ICD-10-CM | POA: Diagnosis present

## 2021-02-23 DIAGNOSIS — Z6834 Body mass index (BMI) 34.0-34.9, adult: Secondary | ICD-10-CM

## 2021-02-23 DIAGNOSIS — S066XAA Traumatic subarachnoid hemorrhage with loss of consciousness status unknown, initial encounter: Secondary | ICD-10-CM | POA: Diagnosis present

## 2021-02-23 DIAGNOSIS — I951 Orthostatic hypotension: Secondary | ICD-10-CM | POA: Diagnosis not present

## 2021-02-23 DIAGNOSIS — Z882 Allergy status to sulfonamides status: Secondary | ICD-10-CM

## 2021-02-23 DIAGNOSIS — F32A Depression, unspecified: Secondary | ICD-10-CM | POA: Diagnosis present

## 2021-02-23 DIAGNOSIS — E785 Hyperlipidemia, unspecified: Secondary | ICD-10-CM | POA: Diagnosis present

## 2021-02-23 DIAGNOSIS — Z79899 Other long term (current) drug therapy: Secondary | ICD-10-CM

## 2021-02-23 DIAGNOSIS — G4733 Obstructive sleep apnea (adult) (pediatric): Secondary | ICD-10-CM | POA: Diagnosis present

## 2021-02-23 DIAGNOSIS — Z885 Allergy status to narcotic agent status: Secondary | ICD-10-CM

## 2021-02-23 DIAGNOSIS — E876 Hypokalemia: Secondary | ICD-10-CM | POA: Diagnosis present

## 2021-02-23 DIAGNOSIS — M1990 Unspecified osteoarthritis, unspecified site: Secondary | ICD-10-CM | POA: Diagnosis present

## 2021-02-23 DIAGNOSIS — G2581 Restless legs syndrome: Secondary | ICD-10-CM | POA: Diagnosis present

## 2021-02-23 DIAGNOSIS — F17210 Nicotine dependence, cigarettes, uncomplicated: Secondary | ICD-10-CM | POA: Diagnosis present

## 2021-02-23 DIAGNOSIS — N179 Acute kidney failure, unspecified: Secondary | ICD-10-CM | POA: Diagnosis present

## 2021-02-23 DIAGNOSIS — F4310 Post-traumatic stress disorder, unspecified: Secondary | ICD-10-CM | POA: Diagnosis present

## 2021-02-23 DIAGNOSIS — I2510 Atherosclerotic heart disease of native coronary artery without angina pectoris: Secondary | ICD-10-CM | POA: Diagnosis present

## 2021-02-23 DIAGNOSIS — N184 Chronic kidney disease, stage 4 (severe): Secondary | ICD-10-CM | POA: Diagnosis present

## 2021-02-23 DIAGNOSIS — W19XXXA Unspecified fall, initial encounter: Secondary | ICD-10-CM | POA: Diagnosis present

## 2021-02-23 DIAGNOSIS — R0902 Hypoxemia: Secondary | ICD-10-CM | POA: Diagnosis not present

## 2021-02-23 DIAGNOSIS — Z8249 Family history of ischemic heart disease and other diseases of the circulatory system: Secondary | ICD-10-CM

## 2021-02-23 DIAGNOSIS — M109 Gout, unspecified: Secondary | ICD-10-CM | POA: Diagnosis present

## 2021-02-23 DIAGNOSIS — R531 Weakness: Secondary | ICD-10-CM | POA: Diagnosis not present

## 2021-02-23 LAB — CBC
HCT: 28.1 % — ABNORMAL LOW (ref 39.0–52.0)
Hemoglobin: 9.4 g/dL — ABNORMAL LOW (ref 13.0–17.0)
MCH: 30.6 pg (ref 26.0–34.0)
MCHC: 33.5 g/dL (ref 30.0–36.0)
MCV: 91.5 fL (ref 80.0–100.0)
Platelets: 149 10*3/uL — ABNORMAL LOW (ref 150–400)
RBC: 3.07 MIL/uL — ABNORMAL LOW (ref 4.22–5.81)
RDW: 15.2 % (ref 11.5–15.5)
WBC: 6 10*3/uL (ref 4.0–10.5)
nRBC: 0 % (ref 0.0–0.2)

## 2021-02-23 LAB — BASIC METABOLIC PANEL
Anion gap: 6 (ref 5–15)
BUN: 27 mg/dL — ABNORMAL HIGH (ref 8–23)
CO2: 23 mmol/L (ref 22–32)
Calcium: 7.5 mg/dL — ABNORMAL LOW (ref 8.9–10.3)
Chloride: 110 mmol/L (ref 98–111)
Creatinine, Ser: 2.91 mg/dL — ABNORMAL HIGH (ref 0.61–1.24)
GFR, Estimated: 22 mL/min — ABNORMAL LOW (ref >60)
Glucose, Bld: 101 mg/dL — ABNORMAL HIGH (ref 70–99)
Potassium: 3.3 mmol/L — ABNORMAL LOW (ref 3.5–5.1)
Sodium: 139 mmol/L (ref 135–145)

## 2021-02-23 LAB — HEPATIC FUNCTION PANEL
ALT: 14 U/L (ref 0–44)
AST: 12 U/L — ABNORMAL LOW (ref 15–41)
Albumin: 3 g/dL — ABNORMAL LOW (ref 3.5–5.0)
Alkaline Phosphatase: 67 U/L (ref 38–126)
Bilirubin, Direct: 0.2 mg/dL (ref 0.0–0.2)
Indirect Bilirubin: 0.5 mg/dL (ref 0.3–0.9)
Total Bilirubin: 0.7 mg/dL (ref 0.3–1.2)
Total Protein: 5.9 g/dL — ABNORMAL LOW (ref 6.5–8.1)

## 2021-02-23 LAB — RESP PANEL BY RT-PCR (FLU A&B, COVID) ARPGX2
Influenza A by PCR: NEGATIVE
Influenza B by PCR: NEGATIVE
SARS Coronavirus 2 by RT PCR: POSITIVE — AB

## 2021-02-23 LAB — TROPONIN I (HIGH SENSITIVITY)
Troponin I (High Sensitivity): 10 ng/L (ref <18)
Troponin I (High Sensitivity): 8 ng/L (ref <18)

## 2021-02-23 LAB — MAGNESIUM: Magnesium: 2.3 mg/dL (ref 1.7–2.4)

## 2021-02-23 MED ORDER — SODIUM CHLORIDE 0.9 % IV BOLUS
1000.0000 mL | Freq: Once | INTRAVENOUS | Status: AC
  Administered 2021-02-23: 1000 mL via INTRAVENOUS

## 2021-02-23 MED ORDER — OXYCODONE HCL 5 MG PO TABS: 5.0000 mg | ORAL_TABLET | ORAL | Status: DC | PRN

## 2021-02-23 MED ORDER — AMLODIPINE BESYLATE 5 MG PO TABS
5.0000 mg | ORAL_TABLET | Freq: Every day | ORAL | Status: DC
  Administered 2021-02-24 – 2021-02-25 (×2): 5 mg via ORAL
  Filled 2021-02-23 (×3): qty 1

## 2021-02-23 MED ORDER — TAMSULOSIN HCL 0.4 MG PO CAPS
0.4000 mg | ORAL_CAPSULE | Freq: Every day | ORAL | Status: DC
  Administered 2021-02-24 – 2021-02-25 (×2): 0.4 mg via ORAL
  Filled 2021-02-23 (×2): qty 1

## 2021-02-23 MED ORDER — ROSUVASTATIN CALCIUM 20 MG PO TABS
20.0000 mg | ORAL_TABLET | Freq: Every day | ORAL | Status: DC
  Administered 2021-02-24 – 2021-02-25 (×2): 20 mg via ORAL
  Filled 2021-02-23 (×2): qty 1

## 2021-02-23 MED ORDER — ALBUTEROL SULFATE HFA 108 (90 BASE) MCG/ACT IN AERS
1.0000 | INHALATION_SPRAY | Freq: Four times a day (QID) | RESPIRATORY_TRACT | Status: DC | PRN
Start: 2021-02-23 — End: 2021-02-25
  Filled 2021-02-23: qty 6.7

## 2021-02-23 MED ORDER — HYDRALAZINE HCL 20 MG/ML IJ SOLN: 5.0000 mg | INTRAMUSCULAR | Status: DC | PRN

## 2021-02-23 MED ORDER — CARVEDILOL 25 MG PO TABS
25.0000 mg | ORAL_TABLET | Freq: Two times a day (BID) | ORAL | Status: DC
  Administered 2021-02-23 – 2021-02-24 (×2): 25 mg via ORAL
  Filled 2021-02-23: qty 1
  Filled 2021-02-23: qty 2

## 2021-02-23 MED ORDER — ONDANSETRON HCL 4 MG/2ML IJ SOLN: 4.0000 mg | Freq: Four times a day (QID) | INTRAMUSCULAR | Status: DC | PRN

## 2021-02-23 MED ORDER — ACETAMINOPHEN 650 MG RE SUPP: 650.0000 mg | Freq: Four times a day (QID) | RECTAL | Status: DC | PRN

## 2021-02-23 MED ORDER — LOSARTAN POTASSIUM 50 MG PO TABS
25.0000 mg | ORAL_TABLET | Freq: Every day | ORAL | Status: DC
  Administered 2021-02-24: 25 mg via ORAL
  Filled 2021-02-23: qty 1

## 2021-02-23 MED ORDER — MORPHINE SULFATE (PF) 2 MG/ML IV SOLN: 2.0000 mg | INTRAVENOUS | Status: DC | PRN

## 2021-02-23 MED ORDER — POTASSIUM CHLORIDE 20 MEQ PO PACK
40.0000 meq | PACK | Freq: Once | ORAL | Status: AC
  Administered 2021-02-23: 40 meq via ORAL
  Filled 2021-02-23: qty 2

## 2021-02-23 MED ORDER — ONDANSETRON HCL 4 MG PO TABS: 4.0000 mg | ORAL_TABLET | Freq: Four times a day (QID) | ORAL | Status: DC | PRN

## 2021-02-23 MED ORDER — PANTOPRAZOLE SODIUM 40 MG PO TBEC
40.0000 mg | DELAYED_RELEASE_TABLET | Freq: Two times a day (BID) | ORAL | Status: DC
  Administered 2021-02-24 – 2021-02-25 (×3): 40 mg via ORAL
  Filled 2021-02-23 (×3): qty 1

## 2021-02-23 MED ORDER — ACETAMINOPHEN 325 MG PO TABS
650.0000 mg | ORAL_TABLET | Freq: Four times a day (QID) | ORAL | Status: DC | PRN
  Administered 2021-02-25: 650 mg via ORAL
  Filled 2021-02-23: qty 2

## 2021-02-23 MED ORDER — DULOXETINE HCL 60 MG PO CPEP
60.0000 mg | ORAL_CAPSULE | Freq: Every day | ORAL | Status: DC
  Administered 2021-02-24 – 2021-02-25 (×2): 60 mg via ORAL
  Filled 2021-02-23 (×2): qty 1

## 2021-02-23 MED ORDER — ROSUVASTATIN CALCIUM 20 MG PO TABS: 20.0000 mg | ORAL_TABLET | ORAL | Status: DC

## 2021-02-23 MED ORDER — TRAMADOL HCL 50 MG PO TABS: 50.0000 mg | ORAL_TABLET | Freq: Every day | ORAL | Status: DC | PRN

## 2021-02-23 NOTE — ED Triage Notes (Signed)
Brought in from PCP office for dizziness.  Staff reports he is not himself.  Complains of dizziness and weakness.  Covid 3 weeks ago and still trying to recoop.

## 2021-02-23 NOTE — H&P (Signed)
TRH H&P    Patient Demographics:    [name], is a 75 y.o. male  MRN: [id number]  DOB - Aug 31, 1945  Admit Date - 02/23/2021  Referring MD/NP/PA: Gilford Raid  Outpatient Primary MD for the patient is [name], [name], MD  Patient coming from: Home  Chief complaint- Fall   HPI:    [name]  is a 75 y.o. male, with history of aortic atherosclerosis, chronic kidney disease stage III, PD, essential hypertension, GERD, history of closed head injury in 2005, SA cancer, overactive bladder, restless leg syndrome, and more presents the ED with chief complaint of fall.  Really has been feeling dizzy since COVID infection a couple weeks ago.  Several times in the ED during the month of November.  Balance and having a fall.  He reports that he fell backwards and hit the back of his head.  He had no loss of consciousness.  Patient is not on any blood thinners.  Reports a slight frontal headache.  He does not normally get headaches.  It feels like a dull ache.  Fall was 3 hours prior to arrival in the ER.  He had gone to his PCP, but they noted him to be altered and sent him into the ED.  He is very slightly altered.  He does remember what medications he is on, does not really remember the details of the fall.  Apparently this is abnormal for him, but he is alert and oriented x3.  Patient is not sure when he had to have back of his head on, does not know if it was carpet,  Hard floor, or some other surface on the way down to the floor.  She reports no change in vision or change in hearing at this time.  He does feel nauseous.  His last normal meal was the night he has per his report.  Patient does not have any other complaints at this time.  Patient is a current smoker.  He does not drink alcohol.  He does not use illicit drugs.  He is vaccinated for COVID.   In the ED Temp 97.9, heart rate 53-60, respiratory rate  18-20, blood pressure 177/74, satting at 98% No leukocytosis with a white blood cell count of 6.0, hemoglobin 9.4 Patient is hypokalemic at 3.3-appears to be on potassium supplementation at home Creatinine is elevated at 2.91, seems to be near to patient's new baseline Calcium corrects for albumin-8.3 Initial trop 10 1 L NaCl  UA pending CT head - SAH along left frontal lobe EKG - HR 55, Qtc 346 bradycardia     Review of systems:    Accurate ROS could not be obtained 2/2 to altered mental status    Past History of the following :    Past Medical History:  Diagnosis Date   Aortic atherosclerosis ([place])    2004  s/p  closure Penetrating atherosclerotic ulcer of infarenal aorta w/ endovascular stent graft   Arthritis    CKD (chronic kidney disease), stage III (HCC)    Closed fracture of  head of humerus 07/2017   right shoulder from fall; 09-25-2017 per pt no surgical intervention, only wore sling, intermittant pain   Coronary atherosclerosis of native coronary artery    positive myoview for ischemia 09-27-2009;  10-01-2009 per cardiac cath-- minimal nonobstructive CAD w/ 30% LAD    ED (erectile dysfunction)    Emphysema/COPD ([place])    followed by pcp--- last exacerbation 09-12-2017;  09-25-2017 per pt no cough, sob or congestion   Essential hypertension    Family history of kidney cancer    First degree heart block    Frequency of urination    GERD (gastroesophageal reflux disease)    Gout    09-25-2017  per pt stable,  last episode 2015   Heart murmur    History of adenomatous polyp of colon    History of closed head injury 2005   per pt residual resolved   History of external beam radiation therapy    completed 10/ 2015 for prostate cancer   History of gastric ulcer 2014   History of kidney stones    History of pneumothorax    spontaneous pneumo treated w/ chest tube   History of rib fracture 07/2017   from fall,  right side 6th,7th,8th   Neuropathy    OAB  (overactive bladder)    OSA (obstructive sleep apnea)    Intolerant of CPAP   Prostate cancer ([place])    dx 09-26-2011 via bx-- Stage T3b,N0,  Gleason 4+4, PSA 11.2 with METs to external iliac lymph node(resolved with ADT)-- treated w/ ADT ;   05/2013 staging work-up for rising PSA , started casodex and completed radiation therapy 10/ 2015;   rising PSA post treatment   RLS (restless legs syndrome)    Ureteral neocystostomy bleed    left side   Urge urinary incontinence       Past Surgical History:  Procedure Laterality Date   ABDOMINAL AORTIC ENDOVASCULAR STENT GRAFT  09-13-2007    dr [name]  [facility]   closure penetrating atherosclerotic ulcer of infarenal aorta with stent graft   BIOPSY  03/06/2019   Procedure: BIOPSY;  Surgeon: [name], MD;  [facility];  Service: Endoscopy;;  gastric   CARDIAC CATHETERIZATION  2005   per pt normal (done in [place])   [place]  10/06/2009    dr cooper   minimal nonobstructive CAD w/30% LAD otherwise normal coronaries, LVEDP 54mmHg   CARDIOVASCULAR STRESS TEST  09/27/2009    dr Aundra Dubin   lexiscan nuclear study w/ moderate reversible inferior perfusion defect ischemia,  ef 60% (cardiac cath scheduled)   CATARACT EXTRACTION W/PHACO Right 07/12/2020   Procedure: CATARACT EXTRACTION PHACO AND INTRAOCULAR LENS PLACEMENT RIGHT EYE;  Surgeon: [name], MD;  Location: [facility];  Service: Ophthalmology;  Laterality: Right;  CDE=15.48   CHEST TUBE INSERTION  1989   "collapsed lung"due to injury   CIRCUMCISION  09/26/2011   Procedure: CIRCUMCISION ADULT;  Surgeon: [name], MD;  Location: [facility];  Service: Urology;  Laterality: N/A;   COLONOSCOPY W/ POLYPECTOMY     COLONOSCOPY WITH PROPOFOL N/A 04/27/2016    eight 4-8 mm polyps in descending colon, at splenic flexure, in ascending colon and cecum. Tubular adenomas. Surveillance in 3 years.   CYSTOSCOPY  09/26/2011   Procedure: CYSTOSCOPY;  Surgeon: [name], MD;   [facility];  Service: Urology;  Laterality: N/A;   CYSTOSCOPY/RETROGRADE/URETEROSCOPY Left 09/27/2017   Procedure: CYSTOSCOPYLEFT /RETROGRADE/URETEROSCOPY AND RENAL WASHINGS;  Surgeon:  [name], MD;  Location: [facility];  Service: Urology;  Laterality: Left;   ESOPHAGOGASTRODUODENOSCOPY (EGD) WITH PROPOFOL N/A 04/27/2016   normal esophagus s/p dilation, small hiatal hernia, normal duodenum   ESOPHAGOGASTRODUODENOSCOPY (EGD) WITH PROPOFOL N/A 03/06/2019   erosive reflux esophagitis, s/p dilation, normal duodenum, abnormal gastric mucosa s/p biopsy. Hyperplastic gastric polyp. No H.pylori.    EXTRACORPOREAL SHOCK WAVE LITHOTRIPSY  yrs ago   FRACTURE SURGERY  child   Bilateral lower arms    KNEE ARTHROSCOPY Right 2013   MALONEY DILATION N/A 04/27/2016   Procedure: Venia Minks DILATION;  Surgeon: [name], MD;  [facility];  Service: Endoscopy;  Laterality: N/A;   MALONEY DILATION N/A 03/06/2019   Procedure: Venia Minks DILATION;  Surgeon: [name], MD;  [facility];  Service: Endoscopy;  Laterality: N/A;   PARTIAL KNEE ARTHROPLASTY Right 04/26/2015   Procedure: RIGHT KNEE MEDIAL UNICOMPARTMENTAL ARTHROPLASTY;  Surgeon: Gaynelle Arabian, MD;  [facility];  Service: Orthopedics;  Laterality: Right;   POLYPECTOMY  04/27/2016   Procedure: POLYPECTOMY;  Surgeon: [name], MD;  [facility];  Service: Endoscopy;;  cecal , ascending, descending, and sigmoid polypectomies   PROSTATE BIOPSY  09/26/2011   Procedure: BIOPSY TRANSRECTAL ULTRASONIC PROSTATE (TUBP);  Surgeon: [name], MD;  [facility];  Service: Urology;  Laterality: N/A;      TRANSTHORACIC ECHOCARDIOGRAM  01-07-2013   dr [name]   ef 60-65%,  grade 1 diastolic dysfunction/  mild AR without stenosis/  mild LAE/  mild to moderate calcificed MV annulus without regurg. or stenosis/   UMBILICAL HERNIA REPAIR  yrs ago      Social History:      Social History    Tobacco Use   Smoking status: Every Day    Packs/day: 0.50    Years: 42.00    Pack years: 21.00    Types: Cigarettes   Smokeless tobacco: Never  Substance Use Topics   Alcohol use: No    Alcohol/week: 0.0 standard drinks       Family History :     Family History  Problem Relation Age of Onset   Stroke Mother    Alzheimer's disease Mother 57       probably due to head injury   Mesothelioma Father 58   Kidney cancer Father    Hyperlipidemia Sister    Heart attack Sister    Kidney cancer Sister        dx in her 30s   Heart disease Brother    Kidney cancer Brother 4       dx in his 40s   Hyperlipidemia Brother    Kidney cancer Brother        dx in his 57s   Healthy Daughter    Healthy Son    Healthy Son    Colon cancer Neg Hx       Home Medications:   Prior to Admission medications   Medication Sig Start Date End Date Taking? Authorizing Provider  allopurinol (ZYLOPRIM) 300 MG tablet Take 1 tablet by mouth once daily 01/12/21  Yes [name], [name], MD  amLODipine (NORVASC) 5 MG tablet Take 1 tablet (5 mg total) by mouth daily. 10/14/20  Yes [name], [name], MD  carvedilol (COREG) 25 MG tablet Take 1 tablet by mouth twice daily 01/12/21  Yes [name], [name], MD  DULoxetine (CYMBALTA) 60 MG capsule Take 1 capsule (60 mg total) by mouth daily. Take  1 capsule by mouth once daily (Needs to be seen before next refill) 05/19/20  Yes [name], [name], MD  fluconazole (DIFLUCAN) 100 MG tablet Take 1 tablet (100 mg total) by mouth daily for 14 days. 02/15/21 03/01/21 Yes Noemi Chapel, MD  losartan (COZAAR) 25 MG tablet Take 1 tablet (25 mg total) by mouth daily. 10/14/20  Yes [name], [name], MD  nystatin cream (MYCOSTATIN) Apply to affected area 2 times daily 02/15/21  Yes Noemi Chapel, MD  potassium chloride SA (KLOR-CON) 20 MEQ tablet Take 1 tablet by mouth twice daily 01/12/21  Yes Pennington, Rebekah M, PA-C  rosuvastatin (CRESTOR) 20 MG tablet TAKE 1  TABLET BY MOUTH ONCE DAILY -  STOP  PRAVASTATIN -- NEEDS FOLLOW UP APPOINTMENT FOR REFILLS Patient taking differently: Take 20 mg by mouth See admin instructions. TAKE 1 TABLET BY MOUTH ONCE DAILY -  STOP  PRAVASTATIN -- NEEDS FOLLOW UP APPOINTMENT FOR REFILLS 09/30/20  Yes Satira Sark, MD  tamsulosin (FLOMAX) 0.4 MG CAPS capsule Take 1 capsule (0.4 mg total) by mouth daily. 04/29/20  Yes [name], MD  traMADol (ULTRAM) 50 MG tablet Take 1 tablet (50 mg total) by mouth daily as needed. 05/19/20  Yes [name], [name], MD  abiraterone acetate (ZYTIGA) 250 MG tablet Take 2 tablets (500 mg total) by mouth daily. Take on an empty stomach 1 hour before or 2 hours after a meal Patient not taking: No sig reported 10/27/20   Heath Lark, MD  albuterol (VENTOLIN HFA) 108 (90 Base) MCG/ACT inhaler Inhale 1-2 puffs into the lungs every 6 (six) hours as needed for wheezing or shortness of breath. Patient not taking: Reported on 02/23/2021 02/06/21   Tonye Pearson, PA-C  pantoprazole (PROTONIX) 40 MG tablet TAKE 1 TABLET BY MOUTH TWICE DAILY BEFORE A MEAL 01/12/21   Gwenlyn Perking, FNP  polyethylene glycol powder (GLYCOLAX/MIRALAX) 17 GM/SCOOP powder Take 17 g by mouth daily. Patient not taking: Reported on 02/23/2021 02/16/21   Veryl Speak, MD     Allergies:     Allergies  Allergen Reactions   Carbidopa-Levodopa Other (See Comments)    hallunications   Morphine Sulfate Nausea And Vomiting   Sulfa Antibiotics Nausea And Vomiting   Sulfacetamide Sodium Nausea And Vomiting     Physical Exam:   Vitals  Blood pressure (!) 168/76, pulse 63, temperature 97.9 F (36.6 C), temperature source Oral, resp. rate 19, height 5\' 8"  (1.727 m), weight 104.3 kg, SpO2 96 %.   1.  General: Patient lying supine in bed,  no acute distress   2. Psychiatric: Alert and oriented x 3, mood and behavior normal for situation, pleasant and cooperative with exam   3. Neurologic: Speech and language are  normal, face is symmetric, moves all 4 extremities voluntarily, 5/5 strength in bilateral upper and lower extremities, equal sensation bilateral extremities at baseline without acute deficits on limited exam   4. HEENMT:  Head is atraumatic, normocephalic, pupils reactive to light, neck is supple, trachea is midline, mucous membranes are moist   5. Respiratory : Lungs are clear to auscultation bilaterally without wheezing, rhonchi, rales, no cyanosis, no increase in work of breathing or accessory muscle use   6. Cardiovascular : Heart rate normal, rhythm is regular, slight systolic murmur, no rubs or gallops, no peripheral edema, peripheral pulses palpated   7. Gastrointestinal:  Abdomen is soft, nondistended, nontender to palpation bowel sounds active, no masses or organomegaly palpated   8. Skin:  Skin is  warm, dry and intact without rashes, acute lesions, or ulcers on limited exam   9.Musculoskeletal:  No acute deformities or trauma, no asymmetry in tone, no peripheral edema, peripheral pulses palpated, no tenderness to palpation in the extremities     Data Review:    CBC Recent Labs  Lab 02/23/21 1800  WBC 6.0  HGB 9.4*  HCT 28.1*  PLT 149*  MCV 91.5  MCH 30.6  MCHC 33.5  RDW 15.2   ------------------------------------------------------------------------------------------------------------------  Results for orders placed or performed during the hospital encounter of 02/23/21 (from the past 48 hour(s))  Basic metabolic panel     Status: Abnormal   Collection Time: 02/23/21  6:00 PM  Result Value Ref Range   Sodium 139 135 - 145 mmol/L   Potassium 3.3 (L) 3.5 - 5.1 mmol/L   Chloride 110 98 - 111 mmol/L   CO2 23 22 - 32 mmol/L   Glucose, Bld 101 (H) 70 - 99 mg/dL    Comment: Glucose reference range applies only to samples taken after fasting for at least 8 hours.   BUN 27 (H) 8 - 23 mg/dL   Creatinine, Ser 2.91 (H) 0.61 - 1.24 mg/dL   Calcium 7.5 (L) 8.9 - 10.3  mg/dL   GFR, Estimated 22 (L) >60 mL/min    Comment: (NOTE) Calculated using the CKD-EPI Creatinine Equation (2021)    Anion gap 6 5 - 15    Comment: Performed at [facility], [address], [place], [place] [address]  CBC     Status: Abnormal   Collection Time: 02/23/21  6:00 PM  Result Value Ref Range   WBC 6.0 4.0 - 10.5 K/uL   RBC 3.07 (L) 4.22 - 5.81 MIL/uL   Hemoglobin 9.4 (L) 13.0 - 17.0 g/dL   HCT 28.1 (L) 39.0 - 52.0 %   MCV 91.5 80.0 - 100.0 fL   MCH 30.6 26.0 - 34.0 pg   MCHC 33.5 30.0 - 36.0 g/dL   RDW 15.2 11.5 - 15.5 %   Platelets 149 (L) 150 - 400 K/uL   nRBC 0.0 0.0 - 0.2 %    Comment: Performed at [facility], [address], [place], [place] [address]  Magnesium     Status: None   Collection Time: 02/23/21  6:00 PM  Result Value Ref Range   Magnesium 2.3 1.7 - 2.4 mg/dL    Comment: Performed at [facility], [address], [place], [place] [address]  Troponin I (High Sensitivity)     Status: None   Collection Time: 02/23/21  6:00 PM  Result Value Ref Range   Troponin I (High Sensitivity) 10 <18 ng/L    Comment: (NOTE) Elevated high sensitivity troponin I (hsTnI) values and significant  changes across serial measurements may suggest ACS but many other  chronic and acute conditions are known to elevate hsTnI results.  Refer to the "Links" section for chest pain algorithms and additional  guidance. Performed at [facility], [address] [place], [place] [address]   Hepatic function panel     Status: Abnormal   Collection Time: 02/23/21  6:00 PM  Result Value Ref Range   Total Protein 5.9 (L) 6.5 - 8.1 g/dL   Albumin 3.0 (L) 3.5 - 5.0 g/dL   AST 12 (L) 15 - 41 U/L   ALT 14 0 - 44 U/L   Alkaline Phosphatase 67 38 - 126 U/L   Total Bilirubin 0.7 0.3 - 1.2 mg/dL   Bilirubin, Direct 0.2 0.0 -  0.2 mg/dL   Indirect Bilirubin 0.5 0.3 - 0.9 mg/dL    Comment: Performed at [facility], [address] [place], [place] [address]  Resp Panel by RT-PCR (Flu A&B,  Covid) Nasopharyngeal Swab     Status: Abnormal   Collection Time: 02/23/21  6:28 PM   Specimen: Nasopharyngeal Swab; Nasopharyngeal(NP) swabs in vial transport medium  Result Value Ref Range   SARS Coronavirus 2 by RT PCR POSITIVE (A) NEGATIVE    Comment: CRITICAL RESULT CALLED TO, READ BACK BY AND VERIFIED WITH: BRANDY GALLOWAY RN,2039,02/23/2021,SELF S (NOTE) SARS-CoV-2 target nucleic acids are DETECTED.  The SARS-CoV-2 RNA is generally detectable in upper respiratory specimens during the acute phase of infection. Positive results are indicative of the presence of the identified virus, but do not rule out bacterial infection or co-infection with other pathogens not detected by the test. Clinical correlation with patient history and other diagnostic information is necessary to determine patient infection status. The expected result is Negative.  Fact Sheet for Patients: EntrepreneurPulse.com.au  Fact Sheet for Healthcare Providers: IncredibleEmployment.be  This test is not yet approved or cleared by the [place] FDA and  has been authorized for detection and/or diagnosis of SARS-CoV-2 by FDA under an Emergency Use Authorization (EUA).  This EUA will remain in effect (meaning  this test can be used) for the duration of  the COVID-19 declaration under Section 564(b)(1) of the Act, 21 U.S.C. section 360bbb-3(b)(1), unless the authorization is terminated or revoked sooner.     Influenza A by PCR NEGATIVE NEGATIVE   Influenza B by PCR NEGATIVE NEGATIVE    Comment: (NOTE) The Xpert Xpress SARS-CoV-2/FLU/RSV plus assay is intended as an aid in the diagnosis of influenza from Nasopharyngeal swab specimens and should not be used as a sole basis for treatment. Nasal washings and aspirates are unacceptable for Xpert Xpress SARS-CoV-2/FLU/RSV testing.  Fact Sheet for Patients: EntrepreneurPulse.com.au  Fact Sheet for  Healthcare Providers: IncredibleEmployment.be  This test is not yet approved or cleared by the [place] FDA and has been authorized for detection and/or diagnosis of SARS-CoV-2 by FDA under an Emergency Use Authorization (EUA). This EUA will remain in effect (meaning this test can be used) for the duration of the COVID-19 declaration under Section 564(b)(1) of the Act, 21 U.S.C. section 360bbb-3(b)(1), unless the authorization is terminated or revoked.  Performed at [facility], [address] [place], [place] [address]   Troponin I (High Sensitivity)     Status: None   Collection Time: 02/23/21  7:49 PM  Result Value Ref Range   Troponin I (High Sensitivity) 8 <18 ng/L    Comment: (NOTE) Elevated high sensitivity troponin I (hsTnI) values and significant  changes across serial measurements may suggest ACS but many other  chronic and acute conditions are known to elevate hsTnI results.  Refer to the "Links" section for chest pain algorithms and additional  guidance. Performed at [facility], [address] [place],  [address]     Chemistries  Recent Labs  Lab 02/23/21 1800  NA 139  K 3.3*  CL 110  CO2 23  GLUCOSE 101*  BUN 27*  CREATININE 2.91*  CALCIUM 7.5*  MG 2.3  AST 12*  ALT 14  ALKPHOS 67  BILITOT 0.7   ------------------------------------------------------------------------------------------------------------------  ------------------------------------------------------------------------------------------------------------------ GFR: Estimated Creatinine Clearance: 25.3 mL/min (A) (by C-G formula based on SCr of 2.91 mg/dL (H)). Liver Function Tests: Recent Labs  Lab 02/23/21 1800  AST 12*  ALT 14  ALKPHOS 67  BILITOT 0.7  PROT 5.9*  ALBUMIN 3.0*   No results for input(s): LIPASE, AMYLASE in the last 168 hours. No results for input(s): AMMONIA in the last 168 hours. Coagulation Profile: No results for input(s):  INR, PROTIME in the last 168 hours. Cardiac Enzymes: No results for input(s): CKTOTAL, CKMB, CKMBINDEX, TROPONINI in the last 168 hours. BNP (last 3 results) No results for input(s): PROBNP in the last 8760 hours. HbA1C: No results for input(s): HGBA1C in the last 72 hours. CBG: No results for input(s): GLUCAP in the last 168 hours. Lipid Profile: No results for input(s): CHOL, HDL, LDLCALC, TRIG, CHOLHDL, LDLDIRECT in the last 72 hours. Thyroid Function Tests: No results for input(s): TSH, T4TOTAL, FREET4, T3FREE, THYROIDAB in the last 72 hours. Anemia Panel: No results for input(s): VITAMINB12, FOLATE, FERRITIN, TIBC, IRON, RETICCTPCT in the last 72 hours.  --------------------------------------------------------------------------------------------------------------- Urine analysis:    Component Value Date/Time   COLORURINE YELLOW 02/15/2021 2223   APPEARANCEUR HAZY (A) 02/15/2021 2223   APPEARANCEUR Clear 11/04/2020 1327   LABSPEC 1.016 02/15/2021 2223   PHURINE 6.0 02/15/2021 2223   GLUCOSEU NEGATIVE 02/15/2021 2223   HGBUR LARGE (A) 02/15/2021 2223   HGBUR negative 02/20/2008 0900   BILIRUBINUR NEGATIVE 02/15/2021 2223   BILIRUBINUR Negative 11/04/2020 1327   KETONESUR NEGATIVE 02/15/2021 2223   PROTEINUR >=300 (A) 02/15/2021 2223   UROBILINOGEN negative 07/29/2012 1449   UROBILINOGEN 0.2 10/21/2011 2025   NITRITE NEGATIVE 02/15/2021 2223   LEUKOCYTESUR NEGATIVE 02/15/2021 2223      Imaging Results:    CT Head Wo Contrast  Result Date: 02/23/2021 CLINICAL DATA:  Dizziness, weakness, confusion, prostate cancer EXAM: CT HEAD WITHOUT CONTRAST TECHNIQUE: Contiguous axial images were obtained from the base of the skull through the vertex without intravenous contrast. COMPARISON:  12/18/2019, 11/27/2018 FINDINGS: Brain: Areas of subarachnoid hemorrhage are seen along the left frontal lobe and along the anterior interhemispheric fissure. No mass effect. No signs of acute  infarct. Scattered hypodensities throughout the periventricular white matter consistent with chronic small vessel ischemic changes. The lateral ventricles and remaining midline structures are unremarkable. Vascular: No hyperdense vessel or unexpected calcification. Stable atherosclerosis. Skull: Normal. Negative for fracture or focal lesion. Sinuses/Orbits: No acute finding. Other: None. IMPRESSION: 1. Subarachnoid hemorrhage along the left frontal lobe and the anterior interhemispheric fissure. No mass effect. 2. Chronic small vessel ischemic changes within the white matter. No evidence of acute infarct. Critical Value/emergent results were called by telephone at the time of interpretation on 02/23/2021 at 6:49 pm to provider JULIE HAVILAND , who verbally acknowledged these results. Electronically Signed   By: Randa Ngo M.D.   On: 02/23/2021 18:54      Assessment & Plan:    Principal Problem:   Subarachnoid bleed ([place]) Active Problems:   Hypokalemia   Essential hypertension, benign   TOBACCO ABUSE   Depression, recurrent (HCC)   Acute metabolic encephalopathy   Subarachnoid hemorrhage Secondary to fall Seen on CT Neurosurgery consulted and recommends observing overnight and repeat scanning in the a.m. -this will be done at [facility] in case patient does require the services of neurosurgery Continue neurochecks CT scan ordered for 8 AM Continue to monitor Acute metabolic encephalopathy Secondary to above Continue plan as above Hypokalemia Acute on chronic 40 mEq of potassium ordered at admission Recheck in the a.m. Check magnesium in the a.m. Elevated creatinine Patient's previous creatinine 3.18, creatinine has been as low as 2.0 over the last year, this is likely patient's new baseline  Continue to monitor Tobacco use disorder Continue nicotine patch Hypertension Tight control of blood pressure in the setting of subarachnoid hemorrhage Continue amlodipine, Coreg,  losartan As needed hydralazine Continue to monitor Depression Continue Cymbalta GERD Continue Protonix    DVT Prophylaxis-   SCDs   AM Labs Ordered, also please review Full Orders  Family Communication: No family at bedside  Code Status: Full  Admission status: Observation Disposition: Anticipated Discharge 24 hours  Discharge to home  Time spent in minutes : [place] DO

## 2021-02-23 NOTE — ED Provider Notes (Signed)
[facility] Provider Note   CSN: [id number] Arrival date & time: 02/23/21  1729     History Chief Complaint  Patient presents with   Dizziness    [name] is a 75 y.o. male.  Pt presents to the ED today with dizziness.  The pt said he had Covid 3 weeks ago and has not felt himself.  Pt went to his pcp and he was sent here.  Pt said he's had several falls.  The most recent fall was today.  He has hit his head.  He is not on blood thinners.      Past Medical History:  Diagnosis Date   Aortic atherosclerosis ([place])    2004  s/p  closure Penetrating atherosclerotic ulcer of infarenal aorta w/ endovascular stent graft   Arthritis    CKD (chronic kidney disease), stage III (HCC)    Closed fracture of head of humerus 07/2017   right shoulder from fall; 09-25-2017 per pt no surgical intervention, only wore sling, intermittant pain   Coronary atherosclerosis of native coronary artery    positive myoview for ischemia 09-27-2009;  10-01-2009 per cardiac cath-- minimal nonobstructive CAD w/ 30% LAD    ED (erectile dysfunction)    Emphysema/COPD ([place])    followed by pcp--- last exacerbation 09-12-2017;  09-25-2017 per pt no cough, sob or congestion   Essential hypertension    Family history of kidney cancer    First degree heart block    Frequency of urination    GERD (gastroesophageal reflux disease)    Gout    09-25-2017  per pt stable,  last episode 2015   Heart murmur    History of adenomatous polyp of colon    History of closed head injury 2005   per pt residual resolved   History of external beam radiation therapy    completed 10/ 2015 for prostate cancer   History of gastric ulcer 2014   History of kidney stones    History of pneumothorax    spontaneous pneumo treated w/ chest tube   History of rib fracture 07/2017   from fall,  right side 6th,7th,8th   Neuropathy    OAB (overactive bladder)    OSA (obstructive sleep apnea)    Intolerant  of CPAP   Prostate cancer ([place])    dx 09-26-2011 via bx-- Stage T3b,N0,  Gleason 4+4, PSA 11.2 with METs to external iliac lymph node(resolved with ADT)-- treated w/ ADT ;   05/2013 staging work-up for rising PSA , started casodex and completed radiation therapy 10/ 2015;   rising PSA post treatment   RLS (restless legs syndrome)    Ureteral neocystostomy bleed    left side   Urge urinary incontinence     Patient Active Problem List   Diagnosis Date Noted   Subarachnoid bleed ([place]) 02/23/2021   Incisional hernia, without obstruction or gangrene 76/28/3151   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 01/19/2020   Family history of kidney cancer    Chronic kidney disease (CKD), stage IV (severe) ([place]) 07/10/2018   Depression, recurrent ([place]) 04/08/2018   Neuropathy 03/17/2015   Prostate cancer ([place]) 06/30/2013   Abdominal aneurysm without mention of rupture 02/19/2013   Morbid obesity ([place]) 01/14/2013   VITAMIN B12 DEFICIENCY 01/14/2013   TOBACCO ABUSE 01/14/2013   PTSD 01/14/2013   COPD (chronic obstructive pulmonary disease) (HCC)    OSA (obstructive sleep apnea) 08/14/2012   RLS (restless legs syndrome) 08/14/2012   Prediabetes 08/14/2012  Aortic atherosclerosis ([place]) 02/19/2012   Coronary atherosclerosis of native coronary artery 03/23/2010   GERD 07/30/2008   History of colonic polyps 07/30/2008   Benign prostatic hyperplasia with lower urinary tract symptoms 02/20/2008   Hyperlipidemia 01/31/2007   GOUT 01/31/2007   Essential hypertension, benign 01/31/2007    Past Surgical History:  Procedure Laterality Date   ABDOMINAL AORTIC ENDOVASCULAR STENT GRAFT  09-13-2007    dr [name]  [facility]   closure penetrating atherosclerotic ulcer of infarenal aorta with stent graft   BIOPSY  03/06/2019   Procedure: BIOPSY;  Surgeon: [name], MD;  [facility];  Service: Endoscopy;;  gastric   CARDIAC CATHETERIZATION  2005   per pt normal (done in [place])   [place]  10/06/2009    dr cooper   minimal nonobstructive CAD w/30% LAD otherwise normal coronaries, LVEDP 70mmHg   CARDIOVASCULAR STRESS TEST  09/27/2009    dr Aundra Dubin   lexiscan nuclear study w/ moderate reversible inferior perfusion defect ischemia,  ef 60% (cardiac cath scheduled)   CATARACT EXTRACTION W/PHACO Right 07/12/2020   Procedure: CATARACT EXTRACTION PHACO AND INTRAOCULAR LENS PLACEMENT RIGHT EYE;  Surgeon: [name], MD;  Location: [facility];  Service: Ophthalmology;  Laterality: Right;  CDE=15.48   CHEST TUBE INSERTION  1989   "collapsed lung"due to injury   CIRCUMCISION  09/26/2011   Procedure: CIRCUMCISION ADULT;  Surgeon: [name], MD;  Location: [facility];  Service: Urology;  Laterality: N/A;   COLONOSCOPY W/ POLYPECTOMY     COLONOSCOPY WITH PROPOFOL N/A 04/27/2016    eight 4-8 mm polyps in descending colon, at splenic flexure, in ascending colon and cecum. Tubular adenomas. Surveillance in 3 years.   CYSTOSCOPY  09/26/2011   Procedure: CYSTOSCOPY;  Surgeon: [name], MD;  Location: [facility];  Service: Urology;  Laterality: N/A;   CYSTOSCOPY/RETROGRADE/URETEROSCOPY Left 09/27/2017   Procedure: CYSTOSCOPYLEFT /RETROGRADE/URETEROSCOPY AND RENAL WASHINGS;  Surgeon: [name], MD;  Location: [facility];  Service: Urology;  Laterality: Left;   ESOPHAGOGASTRODUODENOSCOPY (EGD) WITH PROPOFOL N/A 04/27/2016   normal esophagus s/p dilation, small hiatal hernia, normal duodenum   ESOPHAGOGASTRODUODENOSCOPY (EGD) WITH PROPOFOL N/A 03/06/2019   erosive reflux esophagitis, s/p dilation, normal duodenum, abnormal gastric mucosa s/p biopsy. Hyperplastic gastric polyp. No H.pylori.    EXTRACORPOREAL SHOCK WAVE LITHOTRIPSY  yrs ago   FRACTURE SURGERY  child   Bilateral lower arms    KNEE ARTHROSCOPY Right 2013   MALONEY DILATION N/A 04/27/2016   Procedure: Venia Minks DILATION;  Surgeon: [name], MD;  [facility];  Service: Endoscopy;  Laterality: N/A;    MALONEY DILATION N/A 03/06/2019   Procedure: Venia Minks DILATION;  Surgeon: [name], MD;  [facility];  Service: Endoscopy;  Laterality: N/A;   PARTIAL KNEE ARTHROPLASTY Right 04/26/2015   Procedure: RIGHT KNEE MEDIAL UNICOMPARTMENTAL ARTHROPLASTY;  Surgeon: Gaynelle Arabian, MD;  [facility];  Service: Orthopedics;  Laterality: Right;   POLYPECTOMY  04/27/2016   Procedure: POLYPECTOMY;  Surgeon: [name], MD;  [facility];  Service: Endoscopy;;  cecal , ascending, descending, and sigmoid polypectomies   PROSTATE BIOPSY  09/26/2011   Procedure: BIOPSY TRANSRECTAL ULTRASONIC PROSTATE (TUBP);  Surgeon: [name], MD;  [facility];  Service: Urology;  Laterality: N/A;      TRANSTHORACIC ECHOCARDIOGRAM  01-07-2013   dr [name]   ef 60-65%,  grade 1 diastolic dysfunction/  mild AR without stenosis/  mild LAE/  mild to moderate calcificed MV annulus without regurg. or stenosis/   UMBILICAL HERNIA REPAIR  yrs ago       Family History  Problem Relation Age of Onset   Stroke Mother    Alzheimer's disease Mother 81       probably due to head injury   Mesothelioma Father 60   Kidney cancer Father    Hyperlipidemia Sister    Heart attack Sister    Kidney cancer Sister        dx in her 8s   Heart disease Brother    Kidney cancer Brother 31       dx in his 68s   Hyperlipidemia Brother    Kidney cancer Brother        dx in his 31s   Healthy Daughter    Healthy Son    Healthy Son    Colon cancer Neg Hx     Social History   Tobacco Use   Smoking status: Every Day    Packs/day: 0.50    Years: 42.00    Pack years: 21.00    Types: Cigarettes   Smokeless tobacco: Never  Vaping Use   Vaping Use: Never used  Substance Use Topics   Alcohol use: No    Alcohol/week: 0.0 standard drinks   Drug use: No    Home Medications Prior to Admission medications   Medication Sig Start Date End Date Taking? Authorizing Provider  allopurinol (ZYLOPRIM)  300 MG tablet Take 1 tablet by mouth once daily 01/12/21  Yes [name], [name], MD  amLODipine (NORVASC) 5 MG tablet Take 1 tablet (5 mg total) by mouth daily. 10/14/20  Yes [name], [name], MD  carvedilol (COREG) 25 MG tablet Take 1 tablet by mouth twice daily 01/12/21  Yes [name], [name], MD  DULoxetine (CYMBALTA) 60 MG capsule Take 1 capsule (60 mg total) by mouth daily. Take 1 capsule by mouth once daily (Needs to be seen before next refill) 05/19/20  Yes [name], [name], MD  fluconazole (DIFLUCAN) 100 MG tablet Take 1 tablet (100 mg total) by mouth daily for 14 days. 02/15/21 03/01/21 Yes Noemi Chapel, MD  losartan (COZAAR) 25 MG tablet Take 1 tablet (25 mg total) by mouth daily. 10/14/20  Yes [name], [name], MD  nystatin cream (MYCOSTATIN) Apply to affected area 2 times daily 02/15/21  Yes Noemi Chapel, MD  potassium chloride SA (KLOR-CON) 20 MEQ tablet Take 1 tablet by mouth twice daily 01/12/21  Yes Pennington, Rebekah M, PA-C  rosuvastatin (CRESTOR) 20 MG tablet TAKE 1 TABLET BY MOUTH ONCE DAILY -  STOP  PRAVASTATIN -- NEEDS FOLLOW UP APPOINTMENT FOR REFILLS Patient taking differently: Take 20 mg by mouth See admin instructions. TAKE 1 TABLET BY MOUTH ONCE DAILY -  STOP  PRAVASTATIN -- NEEDS FOLLOW UP APPOINTMENT FOR REFILLS 09/30/20  Yes Satira Sark, MD  tamsulosin (FLOMAX) 0.4 MG CAPS capsule Take 1 capsule (0.4 mg total) by mouth daily. 04/29/20  Yes [name], MD  traMADol (ULTRAM) 50 MG tablet Take 1 tablet (50 mg total) by mouth daily as needed. 05/19/20  Yes [name], [name], MD  abiraterone acetate (ZYTIGA) 250 MG tablet Take 2 tablets (500 mg total) by mouth daily. Take on an empty stomach 1 hour before or 2 hours after a meal Patient not taking: No sig reported 10/27/20   Heath Lark, MD  albuterol (VENTOLIN HFA) 108 (90 Base) MCG/ACT inhaler Inhale 1-2 puffs into the lungs every 6 (six)  hours as needed for wheezing or shortness of breath. Patient not  taking: Reported on 02/23/2021 02/06/21   Tonye Pearson, PA-C  pantoprazole (PROTONIX) 40 MG tablet TAKE 1 TABLET BY MOUTH TWICE DAILY BEFORE A MEAL 01/12/21   Gwenlyn Perking, FNP  polyethylene glycol powder (GLYCOLAX/MIRALAX) 17 GM/SCOOP powder Take 17 g by mouth daily. Patient not taking: Reported on 02/23/2021 02/16/21   Veryl Speak, MD    Allergies    Carbidopa-levodopa, Morphine sulfate, Sulfa antibiotics, and Sulfacetamide sodium  Review of Systems   Review of Systems  Neurological:  Positive for dizziness and weakness.  All other systems reviewed and are negative.  Physical Exam Updated Vital Signs BP (!) 177/74   Pulse (!) 58   Temp 97.9 F (36.6 C) (Oral)   Resp 18   Ht 5\' 8"  (1.727 m)   Wt 104.3 kg   SpO2 98%   BMI 34.97 kg/m   Physical Exam Vitals and nursing note reviewed.  Constitutional:      Appearance: Normal appearance.  HENT:     Head: Normocephalic and atraumatic.     Right Ear: External ear normal.     Left Ear: External ear normal.     Nose: Nose normal.     Mouth/Throat:     Mouth: Mucous membranes are moist.     Pharynx: Oropharynx is clear.  Eyes:     Extraocular Movements: Extraocular movements intact.     Conjunctiva/sclera: Conjunctivae normal.     Pupils: Pupils are equal, round, and reactive to light.  Cardiovascular:     Rate and Rhythm: Regular rhythm. Bradycardia present.     Pulses: Normal pulses.     Heart sounds: Normal heart sounds.  Pulmonary:     Effort: Pulmonary effort is normal.     Breath sounds: Normal breath sounds.  Abdominal:     General: Abdomen is flat. Bowel sounds are normal.     Palpations: Abdomen is soft.  Musculoskeletal:        General: Normal range of motion.     Cervical back: Normal range of motion and neck supple.  Skin:    General: Skin is warm.     Capillary Refill: Capillary refill takes less than 2 seconds.  Neurological:     General: No focal deficit present.     Mental Status: He is  alert and oriented to person, place, and time.  Psychiatric:        Mood and Affect: Mood normal.        Behavior: Behavior normal.    ED Results / Procedures / Treatments   Labs (all labs ordered are listed, but only abnormal results are displayed) Labs Reviewed  BASIC METABOLIC PANEL - Abnormal; Notable for the following components:      Result Value   Potassium 3.3 (*)    Glucose, Bld 101 (*)    BUN 27 (*)    Creatinine, Ser 2.91 (*)    Calcium 7.5 (*)    GFR, Estimated 22 (*)    All other components within normal limits  CBC - Abnormal; Notable for the following components:   RBC 3.07 (*)    Hemoglobin 9.4 (*)    HCT 28.1 (*)    Platelets 149 (*)    All other components within normal limits  HEPATIC FUNCTION PANEL - Abnormal; Notable for the following components:   Total Protein 5.9 (*)    Albumin 3.0 (*)    AST 12 (*)  All other components within normal limits  RESP PANEL BY RT-PCR (FLU A&B, COVID) ARPGX2  MAGNESIUM  URINALYSIS, ROUTINE W REFLEX MICROSCOPIC  TROPONIN I (HIGH SENSITIVITY)  TROPONIN I (HIGH SENSITIVITY)    EKG None  Radiology CT Head Wo Contrast  Result Date: 02/23/2021 CLINICAL DATA:  Dizziness, weakness, confusion, prostate cancer EXAM: CT HEAD WITHOUT CONTRAST TECHNIQUE: Contiguous axial images were obtained from the base of the skull through the vertex without intravenous contrast. COMPARISON:  12/18/2019, 11/27/2018 FINDINGS: Brain: Areas of subarachnoid hemorrhage are seen along the left frontal lobe and along the anterior interhemispheric fissure. No mass effect. No signs of acute infarct. Scattered hypodensities throughout the periventricular white matter consistent with chronic small vessel ischemic changes. The lateral ventricles and remaining midline structures are unremarkable. Vascular: No hyperdense vessel or unexpected calcification. Stable atherosclerosis. Skull: Normal. Negative for fracture or focal lesion. Sinuses/Orbits: No acute  finding. Other: None. IMPRESSION: 1. Subarachnoid hemorrhage along the left frontal lobe and the anterior interhemispheric fissure. No mass effect. 2. Chronic small vessel ischemic changes within the white matter. No evidence of acute infarct. Critical Value/emergent results were called by telephone at the time of interpretation on 02/23/2021 at 6:49 pm to provider Herbert Marken , who verbally acknowledged these results. Electronically Signed   By: Randa Ngo M.D.   On: 02/23/2021 18:54    Procedures Procedures   Medications Ordered in ED Medications  sodium chloride 0.9 % bolus 1,000 mL (0 mLs Intravenous Stopped 02/23/21 2029)    ED Course  I have reviewed the triage vital signs and the nursing notes.  Pertinent labs & imaging results that were available during my care of the patient were reviewed by me and considered in my medical decision making (see chart for details).    MDM Rules/Calculators/A&P                           Pt is given IVFs and is feeling a little better.  CT head shows a SAH.  I spoke with Dr. Glenford Peers (NS) who recommended a repeat CT in the morning.  If it is stable, then he can go home from [facility] perspective.  Pt d/w Dr. Clearence Ped ([facility]) for admission.  CRITICAL CARE Performed by: Isla Pence   Total critical care time: 30 minutes  Critical care time was exclusive of separately billable procedures and treating other patients.  Critical care was necessary to treat or prevent imminent or life-threatening deterioration.  Critical care was time spent personally by me on the following activities: development of treatment plan with patient and/or surrogate as well as nursing, discussions with consultants, evaluation of patient's response to treatment, examination of patient, obtaining history from patient or surrogate, ordering and performing treatments and interventions, ordering and review of laboratory studies, ordering and review of radiographic  studies, pulse oximetry and re-evaluation of patient's condition.    Final Clinical Impression(s) / ED Diagnoses Final diagnoses:  SAH (subarachnoid hemorrhage) ([place])  CKD (chronic kidney disease) stage 4, GFR 15-29 ml/min [facility])    Rx / DC Orders ED Discharge Orders     None        Isla Pence, MD 02/23/21 2049

## 2021-02-24 ENCOUNTER — Observation Stay (HOSPITAL_COMMUNITY): Payer: Medicare Other

## 2021-02-24 DIAGNOSIS — Z6834 Body mass index (BMI) 34.0-34.9, adult: Secondary | ICD-10-CM | POA: Diagnosis not present

## 2021-02-24 DIAGNOSIS — E1122 Type 2 diabetes mellitus with diabetic chronic kidney disease: Secondary | ICD-10-CM | POA: Diagnosis present

## 2021-02-24 DIAGNOSIS — F32A Depression, unspecified: Secondary | ICD-10-CM | POA: Diagnosis present

## 2021-02-24 DIAGNOSIS — K219 Gastro-esophageal reflux disease without esophagitis: Secondary | ICD-10-CM | POA: Diagnosis present

## 2021-02-24 DIAGNOSIS — M109 Gout, unspecified: Secondary | ICD-10-CM | POA: Diagnosis present

## 2021-02-24 DIAGNOSIS — I129 Hypertensive chronic kidney disease with stage 1 through stage 4 chronic kidney disease, or unspecified chronic kidney disease: Secondary | ICD-10-CM | POA: Diagnosis present

## 2021-02-24 DIAGNOSIS — R296 Repeated falls: Secondary | ICD-10-CM | POA: Diagnosis present

## 2021-02-24 DIAGNOSIS — N184 Chronic kidney disease, stage 4 (severe): Secondary | ICD-10-CM | POA: Diagnosis present

## 2021-02-24 DIAGNOSIS — M1990 Unspecified osteoarthritis, unspecified site: Secondary | ICD-10-CM | POA: Diagnosis present

## 2021-02-24 DIAGNOSIS — E785 Hyperlipidemia, unspecified: Secondary | ICD-10-CM | POA: Diagnosis present

## 2021-02-24 DIAGNOSIS — F4310 Post-traumatic stress disorder, unspecified: Secondary | ICD-10-CM | POA: Diagnosis present

## 2021-02-24 DIAGNOSIS — I700 Atherosclerosis of aorta: Secondary | ICD-10-CM | POA: Diagnosis present

## 2021-02-24 DIAGNOSIS — I609 Nontraumatic subarachnoid hemorrhage, unspecified: Secondary | ICD-10-CM | POA: Diagnosis not present

## 2021-02-24 DIAGNOSIS — F17210 Nicotine dependence, cigarettes, uncomplicated: Secondary | ICD-10-CM | POA: Diagnosis present

## 2021-02-24 DIAGNOSIS — C61 Malignant neoplasm of prostate: Secondary | ICD-10-CM | POA: Diagnosis present

## 2021-02-24 DIAGNOSIS — G2581 Restless legs syndrome: Secondary | ICD-10-CM | POA: Diagnosis present

## 2021-02-24 DIAGNOSIS — U071 COVID-19: Secondary | ICD-10-CM | POA: Diagnosis present

## 2021-02-24 DIAGNOSIS — W19XXXA Unspecified fall, initial encounter: Secondary | ICD-10-CM | POA: Diagnosis present

## 2021-02-24 DIAGNOSIS — J449 Chronic obstructive pulmonary disease, unspecified: Secondary | ICD-10-CM | POA: Diagnosis present

## 2021-02-24 DIAGNOSIS — N179 Acute kidney failure, unspecified: Secondary | ICD-10-CM | POA: Diagnosis present

## 2021-02-24 DIAGNOSIS — G4733 Obstructive sleep apnea (adult) (pediatric): Secondary | ICD-10-CM | POA: Diagnosis present

## 2021-02-24 DIAGNOSIS — E876 Hypokalemia: Secondary | ICD-10-CM | POA: Diagnosis present

## 2021-02-24 DIAGNOSIS — I2510 Atherosclerotic heart disease of native coronary artery without angina pectoris: Secondary | ICD-10-CM | POA: Diagnosis present

## 2021-02-24 DIAGNOSIS — S066XAA Traumatic subarachnoid hemorrhage with loss of consciousness status unknown, initial encounter: Secondary | ICD-10-CM | POA: Diagnosis present

## 2021-02-24 DIAGNOSIS — I951 Orthostatic hypotension: Secondary | ICD-10-CM | POA: Diagnosis present

## 2021-02-24 DIAGNOSIS — G9341 Metabolic encephalopathy: Secondary | ICD-10-CM | POA: Diagnosis present

## 2021-02-24 DIAGNOSIS — S066X0A Traumatic subarachnoid hemorrhage without loss of consciousness, initial encounter: Secondary | ICD-10-CM | POA: Diagnosis not present

## 2021-02-24 MED ORDER — SODIUM CHLORIDE 0.9 % IV SOLN: INTRAVENOUS | Status: DC

## 2021-02-24 MED ORDER — LEVETIRACETAM 500 MG PO TABS
500.0000 mg | ORAL_TABLET | Freq: Two times a day (BID) | ORAL | Status: DC
  Administered 2021-02-24 – 2021-02-25 (×3): 500 mg via ORAL
  Filled 2021-02-24 (×3): qty 1

## 2021-02-24 MED ORDER — CARVEDILOL 12.5 MG PO TABS
12.5000 mg | ORAL_TABLET | Freq: Two times a day (BID) | ORAL | Status: DC
  Administered 2021-02-24 – 2021-02-25 (×2): 12.5 mg via ORAL
  Filled 2021-02-24 (×2): qty 1

## 2021-02-24 NOTE — Progress Notes (Signed)
Pt originally tested positive for Covid on 02/06/21; pt retested tonight and is still testing positive;  On call, Dr. Olena Heckle notified and ordered to put on precautions until we can contact infectious disease in the morning.

## 2021-02-24 NOTE — Progress Notes (Signed)
PROGRESS NOTE   [name]  TMH:[id number] DOB: 1945-07-28 DOA: 02/23/2021 PCP: [name], [name], MD  Brief Narrative:  75 year old white male BMI 30, CKD 4 DM TY 2 HLD HTN sleep apnea RLS endovascular stent graft of aortic aneurysm infrarenal prostate cancer overactive bladder--diagnosed with COVID 11/6-Rx Mulnupiravir Zofran albuterol--- return to ED 11/8 with orthostasis weakness--treated in the ED again 11/15 for cystitis Keflex  Apparently presented to PCP after a fall at home once again with slight frontal headache and HE was altered he was sent to the ED Hyperkalemic and creatinine was elevated to 2.9 up from baseline 1.7 in July  CT head showed small subarachnoid hemorrhage along left frontal lobe without midline shift Neurosurgery Dr. Reatha Armour was consulted and recommended conservative management and repeat scan   Hospital-Problem based course  Subarachnoid hemorrhage Repeat scan 11/24 shows expected evolution-discussed these findings with Dr. Trenton Gammon of neurosurgery who does not feel any further work-up is necessary Recommending Keppra 500 twice daily for 1 week and then discontinue Patient not on any anticoagulation prior to admission therefore will monitor Therapy is recommending home health but we will keep overnight and make sure he does not feel dizzy prior to discharge discontinue all opiates at this time as no acute pain Recent Covid Orthostatic hypotension Orthostatics are positive I have downward adjusted his blood pressure medications to amlodipine 5 Coreg 12.5 and discontinued his ARB Will probably need clonidineprn for SBP >160 and follow-up with his PCP AKI superimposed on CKD 3-follows with Dr. Theador Hawthorne as an outpatient Will give IV fluids 75 cc/H and reassess labs in a.m. DM TY 2 Patient not on any medication for diabetes--defer to primary care physician for further work-up Reported history of prostate cancer Resume outpatient Zytiga 500 daily, continue  Flomax 0.4 daily Depression Continue Cymbalta 60 mg daily Reported history of OSA/RLS Resume meds as outpatient-do not think he uses CPAP  DVT prophylaxis: SCD Code Status: Full Family Communication: Called wife at phone number [phone] Ms. Vanessa Ralphs Disposition:  Status is: Observation  The patient will require care spanning > 2 midnights and should be moved to inpatient because: needs med adjustement  Consultants:  neurosurgery  Procedures:   Antimicrobials:     Subjective: Little sleepy but coherent no distress Sitting up he feels dizzy No chest pain no fever no chills Seems to be able to follow commands with no focal deficit  Objective: Vitals:   02/24/21 0420 02/24/21 0803 02/24/21 1033 02/24/21 1134  BP: 122/76 (!) 171/78 (!) 159/89 119/68  Pulse: (!) 58 62    Resp: 16 16  12   Temp: 98.3 F (36.8 C) 98 F (36.7 C) 98.5 F (36.9 C) 97.8 F (36.6 C)  TempSrc: Oral Oral Oral Oral  SpO2: 97% 96%  95%  Weight:      Height:        Intake/Output Summary (Last 24 hours) at 02/24/2021 1221 Last data filed at 02/24/2021 0352 Gross per 24 hour  Intake 250 ml  Output 100 ml  Net 150 ml   Filed Weights   02/23/21 1737  Weight: 104.3 kg    Examination:  EOMI NCAT no focal deficit power 5/5 bilaterally upper extremities raises arms above bed Lower extremities power 5/5 Reflexes 2/3 ROM intact S1-S2 bradycardic on monitors CTA B no added sound no rales no rhonchi Abdomen soft nontender no rebound no guarding Neurologically intact no focal deficit as dictated above  Data Reviewed: personally reviewed   CBC    Component  Value Date/Time   WBC 6.0 02/23/2021 1800   RBC 3.07 (L) 02/23/2021 1800   HGB 9.4 (L) 02/23/2021 1800   HGB 11.7 (L) 06/16/2019 1351   HGB 13.7 10/01/2013 1227   HCT 28.1 (L) 02/23/2021 1800   HCT 34.4 (L) 06/16/2019 1351   HCT 41.6 10/01/2013 1227   PLT 149 (L) 02/23/2021 1800   PLT 234 06/16/2019 1351   MCV 91.5  02/23/2021 1800   MCV 90 06/16/2019 1351   MCV 89.7 10/01/2013 1227   MCH 30.6 02/23/2021 1800   MCHC 33.5 02/23/2021 1800   RDW 15.2 02/23/2021 1800   RDW 14.4 06/16/2019 1351   RDW 14.5 10/01/2013 1227   LYMPHSABS 0.4 (L) 02/08/2021 1410   LYMPHSABS 0.9 06/16/2019 1351   LYMPHSABS 1.3 10/01/2013 1227   MONOABS 0.3 02/08/2021 1410   MONOABS 0.5 10/01/2013 1227   EOSABS 0.1 02/08/2021 1410   EOSABS 0.4 06/16/2019 1351   BASOSABS 0.0 02/08/2021 1410   BASOSABS 0.0 06/16/2019 1351   BASOSABS 0.1 10/01/2013 1227   CMP Latest Ref Rng & Units 02/23/2021 02/08/2021 02/06/2021  Glucose 70 - 99 mg/dL 101(H) 102(H) 113(H)  BUN 8 - 23 mg/dL 27(H) 23 25(H)  Creatinine 0.61 - 1.24 mg/dL 2.91(H) 2.30(H) 2.41(H)  Sodium 135 - 145 mmol/L 139 136 139  Potassium 3.5 - 5.1 mmol/L 3.3(L) 3.4(L) 3.5  Chloride 98 - 111 mmol/L 110 108 107  CO2 22 - 32 mmol/L 23 22 21(L)  Calcium 8.9 - 10.3 mg/dL 7.5(L) 7.9(L) 8.1(L)  Total Protein 6.5 - 8.1 g/dL 5.9(L) 7.1 -  Total Bilirubin 0.3 - 1.2 mg/dL 0.7 0.9 -  Alkaline Phos 38 - 126 U/L 67 65 -  AST 15 - 41 U/L 12(L) 21 -  ALT 0 - 44 U/L 14 15 -     Radiology Studies: CT Head Wo Contrast  Result Date: 02/23/2021 CLINICAL DATA:  Dizziness, weakness, confusion, prostate cancer EXAM: CT HEAD WITHOUT CONTRAST TECHNIQUE: Contiguous axial images were obtained from the base of the skull through the vertex without intravenous contrast. COMPARISON:  12/18/2019, 11/27/2018 FINDINGS: Brain: Areas of subarachnoid hemorrhage are seen along the left frontal lobe and along the anterior interhemispheric fissure. No mass effect. No signs of acute infarct. Scattered hypodensities throughout the periventricular white matter consistent with chronic small vessel ischemic changes. The lateral ventricles and remaining midline structures are unremarkable. Vascular: No hyperdense vessel or unexpected calcification. Stable atherosclerosis. Skull: Normal. Negative for fracture or focal  lesion. Sinuses/Orbits: No acute finding. Other: None. IMPRESSION: 1. Subarachnoid hemorrhage along the left frontal lobe and the anterior interhemispheric fissure. No mass effect. 2. Chronic small vessel ischemic changes within the white matter. No evidence of acute infarct. Critical Value/emergent results were called by telephone at the time of interpretation on 02/23/2021 at 6:49 pm to provider JULIE HAVILAND , who verbally acknowledged these results. Electronically Signed   By: Randa Ngo M.D.   On: 02/23/2021 18:54     Scheduled Meds:  amLODipine  5 mg Oral Daily   carvedilol  12.5 mg Oral BID   DULoxetine  60 mg Oral Daily   levETIRAcetam  500 mg Oral BID   pantoprazole  40 mg Oral BID AC   rosuvastatin  20 mg Oral Daily   tamsulosin  0.4 mg Oral Daily   Continuous Infusions:  sodium chloride 75 mL/hr at 02/24/21 1153     LOS: 0 days   Time spent: [place], MD [facility] To contact the  attending provider between 7A-7P or the covering provider during after hours 7P-7A, please log into the web site [email or web address] and access using universal [place] password for that web site. If you do not have the password, please call the hospital operator.  02/24/2021, 12:21 PM

## 2021-02-24 NOTE — Evaluation (Signed)
Physical Therapy Evaluation Patient Details Name: [name] MRN: [id number] DOB: 1945-04-25 Today's Date: 02/24/2021  History of Present Illness  75 yo admitted 11/23 after fall at home with left frontal SAH. PMHx Covid 02/06/21, CKD, HTN, GERD, RLS, COPD, Prostate CA  Clinical Impression  Pt pleasant and reports head injury 7years ago resulting in long term vertigo but none present for last 5 years. Pt reports no symptoms prior to falls at home but does note he has not been eating or drinking since Covid diagnosis. Pt with orthostatic hypotension on eval and significantly limited by symptoms. Pt with decreased transfers, gait and function who will benefit from acute therapy to maximize mobility, safety and independence to decrease burden of care.   Supine 182/83 Sitting 140/81 Standing 81/67 (73), HR 59       Recommendations for follow up therapy are one component of a multi-disciplinary discharge planning process, led by the attending physician.  Recommendations may be updated based on patient status, additional functional criteria and insurance authorization.  Follow Up Recommendations Home health PT    Assistance Recommended at Discharge Frequent or constant Supervision/Assistance  Functional Status Assessment Patient has had a recent decline in their functional status and demonstrates the ability to make significant improvements in function in a reasonable and predictable amount of time.  Equipment Recommendations  BSC/3in1    Recommendations for Other Services       Precautions / Restrictions Precautions Precautions: Fall;Other (comment) Precaution Comments: check BP      Mobility  Bed Mobility Overal bed mobility: Needs Assistance Bed Mobility: Supine to Sit     Supine to sit: Min assist     General bed mobility comments: min HHA to elevate trunk from surface with increased time. REturn to bed without assist    Transfers Overall transfer level: Needs  assistance   Transfers: Sit to/from Stand Sit to Stand: Min guard           General transfer comment: cues for sequence as pt using momentum to rock and rise from surface. with use of hand on surface pt able to rise with control without momentum. Pt with drop in BP with each standing trial x 2 and unable to maintain standing even 1 min due to feeling weak and lightheaded with need to sit. Pt able to side step toward [facility] on 2nd trial with RW prior to sitting    Ambulation/Gait                  Stairs            Wheelchair Mobility    Modified Rankin (Stroke Patients Only)       Balance Overall balance assessment: History of Falls;Needs assistance Sitting-balance support: No upper extremity supported;Feet supported Sitting balance-Leahy Scale: Fair Sitting balance - Comments: EOB without assist   Standing balance support: Bilateral upper extremity supported Standing balance-Leahy Scale: Poor Standing balance comment: bil UE on RW for standing                             Pertinent Vitals/Pain Pain Assessment: No/denies pain    Home Living Family/patient expects to be discharged to:: Private residence Living Arrangements: Spouse/significant other Available Help at Discharge: Family;Available 24 hours/day Type of Home: House Home Access: Stairs to enter;Ramped entrance Entrance Stairs-Rails: Right Entrance Stairs-Number of Steps: 6   Home Layout: One level Home Equipment: Conservation officer, nature (2 wheels);Cane - single point;Shower seat  Prior Function Prior Level of Function : Independent/Modified Independent                     Hand Dominance        Extremity/Trunk Assessment   Upper Extremity Assessment Upper Extremity Assessment: Overall WFL for tasks assessed    Lower Extremity Assessment Lower Extremity Assessment: Overall WFL for tasks assessed    Cervical / Trunk Assessment Cervical / Trunk Assessment: Other  exceptions Cervical / Trunk Exceptions: rounded shoulders  Communication   Communication: No difficulties  Cognition Arousal/Alertness: Awake/alert Behavior During Therapy: WFL for tasks assessed/performed Overall Cognitive Status: Within Functional Limits for tasks assessed                                          General Comments      Exercises     Assessment/Plan    PT Assessment Patient needs continued PT services  PT Problem List Decreased mobility;Decreased safety awareness;Decreased activity tolerance;Decreased balance;Decreased knowledge of use of DME;Cardiopulmonary status limiting activity       PT Treatment Interventions Gait training;Balance training;Functional mobility training;Therapeutic activities;Patient/family education;DME instruction;Therapeutic exercise    PT Goals (Current goals can be found in the Care Plan section)  Acute Rehab PT Goals Patient Stated Goal: be able to walk and return to fishing PT Goal Formulation: With patient Time For Goal Achievement: 03/10/21 Potential to Achieve Goals: Fair    Frequency Min 3X/week   Barriers to discharge Decreased caregiver support pt reports wife with recent hip fx and cannot physically assist    Co-evaluation               AM-PAC PT "6 Clicks" Mobility  Outcome Measure Help needed turning from your back to your side while in a flat bed without using bedrails?: A Little Help needed moving from lying on your back to sitting on the side of a flat bed without using bedrails?: A Little Help needed moving to and from a bed to a chair (including a wheelchair)?: A Little Help needed standing up from a chair using your arms (e.g., wheelchair or bedside chair)?: A Little Help needed to walk in hospital room?: A Lot Help needed climbing 3-5 steps with a railing? : Total 6 Click Score: 15    End of Session Equipment Utilized During Treatment: Gait belt Activity Tolerance: Patient limited  by fatigue;Treatment limited secondary to medical complications (Comment) (orthostatic hypotension) Patient left: in bed;with call bell/phone within reach Nurse Communication: Mobility status PT Visit Diagnosis: Other abnormalities of gait and mobility (R26.89);Repeated falls (R29.6);Difficulty in walking, not elsewhere classified (R26.2)    Time: 7322-0254 PT Time Calculation (min) (ACUTE ONLY): 29 min   Charges:   PT Evaluation $PT Eval Moderate Complexity: 1 Mod PT Treatments $Therapeutic Activity: 8-22 mins        Jeramie Scogin P, PT Acute Rehabilitation Services Pager: [phone] Office: [phone]   Melvern Ramone B Donnisha Besecker 02/24/2021, 11:53 AM

## 2021-02-24 NOTE — Consult Note (Signed)
   Providing Compassionate, Quality Care - Together  Neurosurgery Consult  Referring physician: Dr. Verlon Au Reason for referral: Intracerebral contusions  Chief Complaint: Fall  History of Present Illness: This is a 75 year old male with a history of chronic kidney disease, hypertension, GERD, restless leg syndrome that presented to the emergency department with fall.  Recently complains of dizziness episodes and over the past few weeks has had COVID.  Currently at this time he denies symptomatology from his Arthur.  He denies any loss of consciousness.  He denies any anticoagulation or blood thinners.  Does complain of a slight headache.  He denies any focal weakness, he does have some chronic left shoulder weakness.  Denies any numbness tingling or bowel or bladder changes.  Denies any seizures.  Medications: I have reviewed the patient's current medications. Allergies: No Known Allergies  History reviewed. No pertinent family history. Social History:  has no history on file for tobacco use, alcohol use, and drug use.  ROS: All positives and negatives listed in HPI above  Physical Exam:  Vital signs in last 24 hours: Temp:  [98 F (36.7 C)-98.3 F (36.8 C)] 98 F (36.7 C) (07/25 1814) Pulse Rate:  [58-128] 65 (07/26 0746) Resp:  [11-18] 14 (07/26 0217) BP: (138-182)/(65-125) 153/88 (07/26 0700) SpO2:  [91 %-98 %] 96 % (07/26 0746) PE: Awake alert oriented x2 PERRLA EOMI Cranial nerves II through XII intact Bilateral upper extremity/lower extremity full strength throughout slight left upper extremity chronic deltoid weakness otherwise full strength and symmetric throughout upper and lower extremities Sensation intact to light touch throughout   Impression/Assessment:  75 year old male with  Traumatic subarachnoid hemorrhage without significant mass-effect  Plan:  -Recommend repeat CT this a.m., pending at this time.  If stable can follow-up as an outpatient in 4  weeks. -Keppra x7 days -No acute neurosurgical intervention recommended   Thank you for allowing me to participate in this patient's care.  Please do not hesitate to call with questions or concerns.   Elwin Sleight, [facility] Cell: [phone]

## 2021-02-24 NOTE — Progress Notes (Signed)
OT Cancellation Note  Patient Details Name: OBRYAN RADU MRN: [id number] DOB: [birth year]   Cancelled Treatment:    Reason Eval/Treat Not Completed: Other (comment) Pt just seen by PT, very orthostatic and unable to tolerate OOB attempts. Will hold OT attempt and follow-up for eval, likely 11/25.  Layla Maw 02/24/2021, 11:49 AM

## 2021-02-25 DIAGNOSIS — I609 Nontraumatic subarachnoid hemorrhage, unspecified: Secondary | ICD-10-CM | POA: Diagnosis not present

## 2021-02-25 LAB — RENAL FUNCTION PANEL
Albumin: 2.7 g/dL — ABNORMAL LOW (ref 3.5–5.0)
Anion gap: 6 (ref 5–15)
BUN: 21 mg/dL (ref 8–23)
CO2: 21 mmol/L — ABNORMAL LOW (ref 22–32)
Calcium: 7.7 mg/dL — ABNORMAL LOW (ref 8.9–10.3)
Chloride: 111 mmol/L (ref 98–111)
Creatinine, Ser: 2.36 mg/dL — ABNORMAL HIGH (ref 0.61–1.24)
GFR, Estimated: 28 mL/min — ABNORMAL LOW (ref >60)
Glucose, Bld: 90 mg/dL (ref 70–99)
Phosphorus: 3 mg/dL (ref 2.5–4.6)
Potassium: 3.1 mmol/L — ABNORMAL LOW (ref 3.5–5.1)
Sodium: 138 mmol/L (ref 135–145)

## 2021-02-25 MED ORDER — LEVETIRACETAM 500 MG PO TABS: 500.0000 mg | ORAL_TABLET | Freq: Two times a day (BID) | ORAL | 0 refills | Status: DC

## 2021-02-25 MED ORDER — CARVEDILOL 25 MG PO TABS: 12.5000 mg | ORAL_TABLET | Freq: Two times a day (BID) | ORAL | 0 refills | Status: DC

## 2021-02-25 MED ORDER — ACETAMINOPHEN 325 MG PO TABS
650.0000 mg | ORAL_TABLET | Freq: Four times a day (QID) | ORAL | Status: DC | PRN
Start: 2021-02-25 — End: 2021-07-02

## 2021-02-25 MED ORDER — CLONIDINE HCL 0.1 MG PO TABS: 0.1000 mg | ORAL_TABLET | Freq: Two times a day (BID) | ORAL | 11 refills | Status: DC

## 2021-02-25 NOTE — Discharge Summary (Signed)
Physician Discharge Summary  [name]:[id number] DOB: 1945/06/20 DOA: 02/23/2021  PCP: [name], [name], MD  Admit date: 02/23/2021 Discharge date: 02/25/2021  Time spent: 27 minutes  Recommendations for Outpatient Follow-up:  See meds carefully-have discontinued several antihypertensive medications given orthostatic hypotension leading to falls--clonidine as prescribed scribed twice daily for blood pressures above 180 and may need to reinitiate prior medication slowly in the outpatient setting Patient will need Keppra for 7 days total as per instructions on.  Because of mild subarachnoid hemorrhage Recommend Chem-12 CBC in about 1 week  Discharge Diagnoses:  MAIN problem for hospitalization   Orthostatic hypotension leading to falls and subarachnoid hemorrhage  Please see below for itemized issues addressed in HOpsital- refer to other progress notes for clarity if needed  Discharge Condition: Improved  Diet recommendation: Heart healthy  Filed Weights   02/23/21 1737  Weight: 104.3 kg    History of present illness:  75 year old white male BMI 34, CKD 4 DM TY 2 HLD HTN sleep apnea RLS endovascular stent graft of aortic aneurysm infrarenal prostate cancer overactive bladder--diagnosed with COVID 11/6-Rx Mulnupiravir Zofran albuterol--- return to ED 11/8 with orthostasis weakness--treated in the ED again 11/15 for cystitis Keflex   Apparently presented to PCP after a fall at home once again with slight frontal headache and HE was altered he was sent to the ED Hyperkalemic and creatinine was elevated to 2.9 up from baseline 1.7 in July   CT head showed small subarachnoid hemorrhage along left frontal lobe without midline shift Neurosurgery Dr. Reatha Armour was consulted and recommended conservative management and repeat scan  He was found to be orthostatic  Hospital Course:  Subarachnoid hemorrhage Repeat scan 11/24 shows expected evolution-discussed these findings  with Dr. Trenton Gammon of neurosurgery who does not feel any further work-up is necessary Recommending Keppra 500 twice daily for 1 week and then discontinue-prescription given Patient not on any anticoagulation prior to admission therefore will monitor Patient recommended home health PT we ordered bedside commode Recent Covid Orthostatic hypotension Orthostatics are positive I have downward adjusted his blood pressure medications as per [facility] and patient stabilized and was able to ambulate independently in the room without shortness of breath or dizziness although he did become orthostatic Clonidine BRB prescribed for blood pressure above 180 and follow-up with his PCP for instructions with regards to resumption of meds AKI superimposed on CKD 3-follows with Dr. Theador Hawthorne as an outpatient Creatinine improved with low rate IV fluid He had potassium correction this admission because of hypokalemia DM TY 2 Patient not on any medication for diabetes--defer to primary care physician for further work-up Reported history of prostate cancer Resume outpatient Zytiga 500 daily, continue Flomax 0.4 daily Depression Continue Cymbalta 60 mg daily Reported history of OSA/RLS Resume meds as outpatient-do not think he uses CPAP    Discharge Exam: Vitals:   02/25/21 0320 02/25/21 0746  BP: (!) 157/75 (!) 174/80  Pulse: (!) 58 (!) 55  Resp: 16 15  Temp: 97.9 F (36.6 C) 98 F (36.7 C)  SpO2: 99% 98%    Subj on day of d/c   Awake coherent eating drinking well no distress asking to go home no chest pain no fever no chills No nausea no vomiting Less dizzy-nursing reports ambulatory trial was able to walk around the room without shortness of breath or dizziness  General Exam on discharge  EOMI NCAT no distress CTA B no added sound rales or rhonchi S1-S2 no murmur no rub no gallop Abdomen  soft no rebound no guarding Neurologically intact moving all 4 limbs equally   Discharge  Instructions   Discharge Instructions     Diet - low sodium heart healthy   Complete by: As directed    Discharge instructions   Complete by: As directed    Look carefully at your blood pressure medications as several have changed and you will need to be off many of them until you are seen by your primary care physician when he can determine what neck steps to take-you have been given 1 new medication called clonidine which will drop your blood pressure pretty rapidly if it is above 180-I would recommend you take this if you check your blood pressure and feel it is above this consistently but you need to lay flat after taking it and be very careful to check for dizziness You should follow-up with your primary care physician 1 week to discuss the med changes have been made during the hospital stay-I would not recommend that you get up quickly in fact I would recommend that you get up very slowly take your time moving around and take several minutes and changing position from lying to sitting to standing We will give you a handout about this   Increase activity slowly   Complete by: As directed       Allergies as of 02/25/2021       Reactions   Carbidopa-levodopa Other (See Comments)   hallunications   Morphine Sulfate Nausea And Vomiting   Sulfa Antibiotics Nausea And Vomiting   Sulfacetamide Sodium Nausea And Vomiting        Medication List     STOP taking these medications    allopurinol 300 MG tablet Commonly known as: ZYLOPRIM   amLODipine 5 MG tablet Commonly known as: NORVASC   fluconazole 100 MG tablet Commonly known as: Diflucan   losartan 25 MG tablet Commonly known as: Cozaar   nystatin cream Commonly known as: MYCOSTATIN   polyethylene glycol powder 17 GM/SCOOP powder Commonly known as: GLYCOLAX/MIRALAX   potassium chloride SA 20 MEQ tablet Commonly known as: KLOR-CON   traMADol 50 MG tablet Commonly known as: ULTRAM       TAKE these medications     abiraterone acetate 250 MG tablet Commonly known as: ZYTIGA Take 2 tablets (500 mg total) by mouth daily. Take on an empty stomach 1 hour before or 2 hours after a meal   acetaminophen 325 MG tablet Commonly known as: TYLENOL Take 2 tablets (650 mg total) by mouth every 6 (six) hours as needed for mild pain (or Fever >/= 101).   albuterol 108 (90 Base) MCG/ACT inhaler Commonly known as: VENTOLIN HFA Inhale 1-2 puffs into the lungs every 6 (six) hours as needed for wheezing or shortness of breath.   carvedilol 25 MG tablet Commonly known as: COREG Take 0.5 tablets (12.5 mg total) by mouth 2 (two) times daily. What changed: how much to take   cloNIDine 0.1 MG tablet Commonly known as: Catapres Take 1 tablet (0.1 mg total) by mouth 2 (two) times daily. Take for blood pressure > 180, and make sure u lay flat and get up very slowl if u take this--this can drop ur pressure and cause u to feel dizzy   DULoxetine 60 MG capsule Commonly known as: CYMBALTA Take 1 capsule (60 mg total) by mouth daily. Take 1 capsule by mouth once daily (Needs to be seen before next refill)   levETIRAcetam 500 MG tablet Commonly known  as: KEPPRA Take 1 tablet (500 mg total) by mouth 2 (two) times daily.   pantoprazole 40 MG tablet Commonly known as: PROTONIX TAKE 1 TABLET BY MOUTH TWICE DAILY BEFORE A MEAL   rosuvastatin 20 MG tablet Commonly known as: CRESTOR TAKE 1 TABLET BY MOUTH ONCE DAILY -  STOP  PRAVASTATIN -- NEEDS FOLLOW UP APPOINTMENT FOR REFILLS What changed: See the new instructions.   tamsulosin 0.4 MG Caps capsule Commonly known as: FLOMAX Take 1 capsule (0.4 mg total) by mouth daily.               Durable Medical Equipment  (From admission, onward)           Start     Ordered   02/25/21 1031  DME 3-in-1  Once        02/25/21 1032           Allergies  Allergen Reactions   Carbidopa-Levodopa Other (See Comments)    hallunications   Morphine Sulfate Nausea And  Vomiting   Sulfa Antibiotics Nausea And Vomiting   Sulfacetamide Sodium Nausea And Vomiting      The results of significant diagnostics from this hospitalization (including imaging, microbiology, ancillary and laboratory) are listed below for reference.    Significant Diagnostic Studies: DG Chest 2 View  Result Date: 02/06/2021 CLINICAL DATA:  Fever and cough and body aches EXAM: CHEST - 2 VIEW COMPARISON:  11/18/2020 FINDINGS: The heart size and mediastinal contours are within normal limits. Both lungs are clear. The visualized skeletal structures are unremarkable. Calcified right hilar lymph node. IMPRESSION: No active cardiopulmonary disease. Electronically Signed   By: Franchot Gallo M.D.   On: 02/06/2021 17:01   CT HEAD WO CONTRAST (5MM)  Addendum Date: 02/24/2021   ADDENDUM REPORT: 02/24/2021 09:41 ADDENDUM: These results were called by telephone at the time of interpretation on 02/24/2021 at 9:41 am to provider Dr. Verlon Au, Who verbally acknowledged these results. Electronically Signed   By: Zetta Bills M.D.   On: 02/24/2021 09:41   Result Date: 02/24/2021 CLINICAL DATA:  A 75 year old male presents with intracranial hemorrhage on previous examination, follow-up evaluation. EXAM: CT HEAD WITHOUT CONTRAST TECHNIQUE: Contiguous axial images were obtained from the base of the skull through the vertex without intravenous contrast. COMPARISON:  Comparison is made with February 23, 2021. FINDINGS: Brain: Subarachnoid hemorrhage along the LEFT frontal lobe is similar to the previous study. Inter hemispheric RIGHT parafalcine subarachnoid hemorrhage potentially slightly increased. No signs of mass effect in these areas. Signs of new or worsening areas of intraparenchymal or subarachnoid blood adjacent to LEFT and RIGHT temporal tip. No hydrocephalus, mass effect or midline shift currently. Trace amount of intraventricular blood layers dependently in the lateral ventricles. Vascular: No  hyperdense vessel or unexpected calcification. Skull: Normal. Negative for fracture or focal lesion. Sinuses/Orbits: No acute finding. Other: None IMPRESSION: Signs of new or worsening areas of intraparenchymal or subarachnoid blood adjacent to and involving the LEFT and RIGHT temporal tip. Most suggestive of blooming of hemorrhagic contusion in the setting of fall. Subarachnoid hemorrhage along the LEFT frontal lobe is similar to the previous study. Interhemispheric RIGHT parafalcine subarachnoid hemorrhage along the medial RIGHT frontal lobe potentially slightly increased. Small amount of intraventricular blood layering dependently in the lateral ventricles. No mass effect or midline shift. Call is out to the referring provider to further discuss findings in the above case. Electronically Signed: By: Zetta Bills M.D. On: 02/24/2021 09:24   CT Head Wo Contrast  Result Date: 02/23/2021 CLINICAL DATA:  Dizziness, weakness, confusion, prostate cancer EXAM: CT HEAD WITHOUT CONTRAST TECHNIQUE: Contiguous axial images were obtained from the base of the skull through the vertex without intravenous contrast. COMPARISON:  12/18/2019, 11/27/2018 FINDINGS: Brain: Areas of subarachnoid hemorrhage are seen along the left frontal lobe and along the anterior interhemispheric fissure. No mass effect. No signs of acute infarct. Scattered hypodensities throughout the periventricular white matter consistent with chronic small vessel ischemic changes. The lateral ventricles and remaining midline structures are unremarkable. Vascular: No hyperdense vessel or unexpected calcification. Stable atherosclerosis. Skull: Normal. Negative for fracture or focal lesion. Sinuses/Orbits: No acute finding. Other: None. IMPRESSION: 1. Subarachnoid hemorrhage along the left frontal lobe and the anterior interhemispheric fissure. No mass effect. 2. Chronic small vessel ischemic changes within the white matter. No evidence of acute infarct.  Critical Value/emergent results were called by telephone at the time of interpretation on 02/23/2021 at 6:49 pm to provider JULIE HAVILAND , who verbally acknowledged these results. Electronically Signed   By: Randa Ngo M.D.   On: 02/23/2021 18:54   DG Chest Port 1 View  Result Date: 02/08/2021 CLINICAL DATA:  COVID, shortness of breath EXAM: PORTABLE CHEST 1 VIEW COMPARISON:  02/06/2021 FINDINGS: Cardiomegaly. Mild, diffuse, interstitial opacity increased compared to prior examination the visualized skeletal structures are unremarkable. IMPRESSION: Cardiomegaly with mild, diffuse, interstitial opacity increased compared to prior examination, which may reflect edema or atypical/viral infection. No focal airspace opacity. Electronically Signed   By: Delanna Ahmadi M.D.   On: 02/08/2021 14:27   CT Renal Stone Study  Result Date: 02/16/2021 CLINICAL DATA:  Flank pain and hematuria. EXAM: CT ABDOMEN AND PELVIS WITHOUT CONTRAST TECHNIQUE: Multidetector CT imaging of the abdomen and pelvis was performed following the standard protocol without IV contrast. COMPARISON:  CT without contrast 10/07/2020, 05/20/2015 FINDINGS: Lower chest: There are scattered ground-glass opacities in the right lower lobe concerning for pneumonitis, not seen previously. Scattered linear scar-like opacities are again shown. Hepatobiliary: 19.5 cm in length liver is otherwise unremarkable without contrast. There are several stones in the gallbladder but no wall thickening or biliary dilatation. Pancreas: Unremarkable. No pancreatic ductal dilatation or surrounding inflammatory changes. Spleen: Normal in size, unremarkable except for scattered calcified granulomas. Adrenals/Urinary Tract: There is adenomatous adrenal hyperplasia which was seen on both prior studies and is unchanged. There are bilateral renal cysts, largest is in the left lower pole measuring 5.4 cm. 1 mm and a few punctate nonobstructive caliceal stones are again in  the inferior pole right kidney and small cortical calculus in the superior pole. There are no other collecting system stones. There is no appreciable ureteral stone or hydroureteronephrosis. There is mild bladder thickening versus underdistention which was seen on both prior studies. Stomach/Bowel: No dilatation or wall thickening including the appendix. Left colonic diverticulosis without evidence of diverticulitis or colitis. No large retained fecal burden. Vascular/Lymphatic: Aortic atherosclerosis. No enlarged abdominal or pelvic lymph nodes. Chronic aortobi-iliac stent graft is seen. No AAA. Reproductive: There are fiducial markers along the left side of the prostate and in the central prostate. The prostate is 3.7 cm transverse. Other: There is a small umbilical fat hernia, and a small supraumbilical anterior wall fat hernia left of the midline, unchanged as well as small inguinal fat hernias Musculoskeletal: There is osteopenia and degenerative changes of the lumbar spine. Avascular necrosis is again noted in the anterior superior right femoral head. IMPRESSION: 1. Scattered ground-glass infiltrates in the right lower lobe not seen previously  and most likely due to pneumonitis. Follow-up chest CT recommended after treatment. 2. Nonobstructive micronephrolithiasis on the right. No obstructing stones or hydronephrosis. Bilateral cysts. 3. Cholelithiasis without gallbladder thickening or biliary dilatation. 4. Additional chronic findings described above, including mild bladder thickening versus changes of nondistention with old fiducial markers in the prostate gland. Electronically Signed   By: Telford Nab M.D.   On: 02/16/2021 00:16    Microbiology: Recent Results (from the past 240 hour(s))  Urine Culture     Status: Abnormal   Collection Time: 02/15/21 10:38 PM   Specimen: Urine, Clean Catch  Result Value Ref Range Status   Specimen Description   Final    URINE, CLEAN CATCH Performed at [facility], [address] [place], [place] [address]    Special Requests   Final    NONE Performed at [facility], [address] [place], [place] [address]    Culture (A)  Final    <10,000 COLONIES/mL INSIGNIFICANT GROWTH Performed at [facility], [place] [address] [place], [place] [address]    Report Status 02/17/2021 FINAL  Final  Resp Panel by RT-PCR (Flu A&B, Covid) Nasopharyngeal Swab     Status: Abnormal   Collection Time: 02/23/21  6:28 PM   Specimen: Nasopharyngeal Swab; Nasopharyngeal(NP) swabs in vial transport medium  Result Value Ref Range Status   SARS Coronavirus 2 by RT PCR POSITIVE (A) NEGATIVE Final    Comment: CRITICAL RESULT CALLED TO, READ BACK BY AND VERIFIED WITH: BRANDY GALLOWAY RN,2039,02/23/2021,SELF S (NOTE) SARS-CoV-2 target nucleic acids are DETECTED.  The SARS-CoV-2 RNA is generally detectable in upper respiratory specimens during the acute phase of infection. Positive results are indicative of the presence of the identified virus, but do not rule out bacterial infection or co-infection with other pathogens not detected by the test. Clinical correlation with patient history and other diagnostic information is necessary to determine patient infection status. The expected result is Negative.  Fact Sheet for Patients: EntrepreneurPulse.com.au  Fact Sheet for Healthcare Providers: IncredibleEmployment.be  This test is not yet approved or cleared by the [place] FDA and  has been authorized for detection and/or diagnosis of SARS-CoV-2 by FDA under an Emergency Use Authorization (EUA).  This EUA will remain in effect (meaning  this test can be used) for the duration of  the COVID-19 declaration under Section 564(b)(1) of the Act, 21 U.S.C. section 360bbb-3(b)(1), unless the authorization is terminated or revoked sooner.     Influenza A by PCR NEGATIVE NEGATIVE Final   Influenza B by PCR NEGATIVE NEGATIVE  Final    Comment: (NOTE) The Xpert Xpress SARS-CoV-2/FLU/RSV plus assay is intended as an aid in the diagnosis of influenza from Nasopharyngeal swab specimens and should not be used as a sole basis for treatment. Nasal washings and aspirates are unacceptable for Xpert Xpress SARS-CoV-2/FLU/RSV testing.  Fact Sheet for Patients: EntrepreneurPulse.com.au  Fact Sheet for Healthcare Providers: IncredibleEmployment.be  This test is not yet approved or cleared by the [place] FDA and has been authorized for detection and/or diagnosis of SARS-CoV-2 by FDA under an Emergency Use Authorization (EUA). This EUA will remain in effect (meaning this test can be used) for the duration of the COVID-19 declaration under Section 564(b)(1) of the Act, 21 U.S.C. section 360bbb-3(b)(1), unless the authorization is terminated or revoked.  Performed at [facility], [address] [place],  [address]      Labs: Basic Metabolic Panel: Recent Labs  Lab 02/23/21 1800 02/25/21 [phone]  NA 139 138  K 3.3* 3.1*  CL 110 111  CO2 23 21*  GLUCOSE 101* 90  BUN 27* 21  CREATININE 2.91* 2.36*  CALCIUM 7.5* 7.7*  MG 2.3  --   PHOS  --  3.0   Liver Function Tests: Recent Labs  Lab 02/23/21 1800 02/25/21 0324  AST 12*  --   ALT 14  --   ALKPHOS 67  --   BILITOT 0.7  --   PROT 5.9*  --   ALBUMIN 3.0* 2.7*   No results for input(s): LIPASE, AMYLASE in the last 168 hours. No results for input(s): AMMONIA in the last 168 hours. CBC: Recent Labs  Lab 02/23/21 1800  WBC 6.0  HGB 9.4*  HCT 28.1*  MCV 91.5  PLT 149*   Cardiac Enzymes: No results for input(s): CKTOTAL, CKMB, CKMBINDEX, TROPONINI in the last 168 hours. BNP: BNP (last 3 results) No results for input(s): BNP in the last 8760 hours.  ProBNP (last 3 results) No results for input(s): PROBNP in the last 8760 hours.  CBG: No results for input(s): GLUCAP in the last 168  hours.     Signed:  Nita Sells MD   [facility] 02/25/2021, 10:34 AM

## 2021-02-25 NOTE — Evaluation (Signed)
Occupational Therapy Evaluation Patient Details Name: [name] MRN: [id number] DOB: [birth year] Today's Date: 02/25/2021   History of Present Illness 75 yo admitted 11/23 after fall at home with left frontal SAH. PMHx Covid 02/06/21, CKD, HTN, GERD, RLS, COPD, Prostate CA   Clinical Impression       PT admitted with s/p fall with SAH. Pt currently with functional limitiations due to the deficits listed below (see OT problem list). Pt currently with orthostatic BP with symptoms. Pt talking with a very mumbled speech but able to verbalize answers correctly. No family present to educate about fall risk. Pt with decrease safety awareness. Pt reports at least 8 falls this year alone. Recommend limiting transfer distances and using w/c with family for transfers to car to fall risk. MD notified of d/c BP during session with pending d/c. RN made aware of back in the bed with decrease BP at this time.  Pt will benefit from skilled OT to increase their independence and safety with adls and balance to allow discharge [place].        Recommendations for follow up therapy are one component of a multi-disciplinary discharge planning process, led by the attending physician.  Recommendations may be updated based on patient status, additional functional criteria and insurance authorization.   Follow Up Recommendations  Home health OT    Assistance Recommended at Discharge Intermittent Supervision/Assistance  Functional Status Assessment     Equipment Recommendations  BSC/3in1;Other (comment);Wheelchair (measurements OT);Wheelchair cushion (measurements OT) (urinal / w/c for out of home transfer with family)    Recommendations for Other Services       Precautions / Restrictions Precautions Precautions: Fall;Other (comment) Precaution Comments: check BP      Mobility Bed Mobility Overal bed mobility: Needs Assistance Bed Mobility: Supine to Sit;Sit to Supine     Supine to sit: Min  guard Sit to supine: Min assist   General bed mobility comments: pt requires HOB slightly elevated and bed rail to progress to EOB. pt requires (A) to place bil LE back on bed surface.    Transfers Overall transfer level: Needs assistance   Transfers: Sit to/from Stand Sit to Stand: Min guard           General transfer comment: pt is able to power up but with progressive orthostatic BP      Balance Overall balance assessment: History of Falls   Sitting balance-Leahy Scale: Fair     Standing balance support: Bilateral upper extremity supported Standing balance-Leahy Scale: Poor                             ADL either performed or assessed with clinical judgement   ADL Overall ADL's : Needs assistance/impaired Eating/Feeding: Modified independent   Grooming: Wash/dry hands;Set up;Sitting       Lower Body Bathing: Minimal assistance             Toilet Transfer Details (indicate cue type and reason): pt unable to make it to the commode x2 attempts and required turning around to the bed surfaec         Functional mobility during ADLs: Min guard General ADL Comments: pt abandoned the RW and attempting without. pt unsuccessful x2 attempts at transfer with orthostatic BP and symptoms.     Vision   Additional Comments: wear glasses     Perception     Praxis      Pertinent Vitals/Pain Pain Assessment: No/denies pain  Hand Dominance Right   Extremity/Trunk Assessment Upper Extremity Assessment Upper Extremity Assessment: Generalized weakness   Lower Extremity Assessment Lower Extremity Assessment: Defer to PT evaluation   Cervical / Trunk Assessment Cervical / Trunk Assessment: Other exceptions Cervical / Trunk Exceptions: rounded shoulders   Communication Communication Communication: No difficulties   Cognition Arousal/Alertness: Awake/alert Behavior During Therapy: WFL for tasks assessed/performed Overall Cognitive Status:  Impaired/Different from baseline Area of Impairment: Safety/judgement;Awareness                         Safety/Judgement: Decreased awareness of safety;Decreased awareness of deficits Awareness: Emergent   General Comments: pt attempting to transfer without RW and then immediately returning to bed and states "i can't walk far"     General Comments  SBP dropping >25 points with symptoms. pt unable to complete basic transfer to commode despite multiple attempts. MD notified of SBP changes    Exercises Exercises: Other exercises Other Exercises Other Exercises: recommend use of BSC and urinal at home due to fall risk. No family present to educate on safety and fall risk.   Shoulder Instructions      Home Living Family/patient expects to be discharged to:: Private residence Living Arrangements: Spouse/significant other Available Help at Discharge: Family;Available 24 hours/day Type of Home: House Home Access: Stairs to enter;Ramped entrance Entrance Stairs-Number of Steps: 6 Entrance Stairs-Rails: Right Home Layout: One level     Bathroom Shower/Tub: Occupational psychologist: Standard     Home Equipment: Conservation officer, nature (2 wheels);Cane - single point;Shower seat          Prior Functioning/Environment Prior Level of Function : Independent/Modified Independent                        OT Problem List:        OT Treatment/Interventions:      OT Goals(Current goals can be found in the care plan section)    OT Frequency: Min 2X/week   Barriers to D/C:            Co-evaluation              AM-PAC OT "6 Clicks" Daily Activity     Outcome Measure Help from another person eating meals?: A Little Help from another person taking care of personal grooming?: A Little Help from another person toileting, which includes using toliet, bedpan, or urinal?: A Little Help from another person bathing (including washing, rinsing, drying)?: A  Little Help from another person to put on and taking off regular upper body clothing?: A Little Help from another person to put on and taking off regular lower body clothing?: A Little 6 Click Score: 18   End of Session Equipment Utilized During Treatment: Gait belt;Rolling walker (2 wheels) Nurse Communication: Mobility status;Precautions  Activity Tolerance: Treatment limited secondary to medical complications (Comment) (SBP decrease) Patient left: in bed;with call bell/phone within reach;with bed alarm set  OT Visit Diagnosis: Unsteadiness on feet (R26.81);Muscle weakness (generalized) (M62.81)                Time: 3244-0102 OT Time Calculation (min): 23 min Charges:  OT General Charges $OT Visit: 1 Visit OT Evaluation $OT Eval Moderate Complexity: 1 Mod   Brynn, OTR/L  Acute Rehabilitation Services Pager: [phone] Office: [phone] .   Jeri Modena 02/25/2021, 1:23 PM

## 2021-02-25 NOTE — TOC Transition Note (Signed)
Transition of Care (TOC) - CM/SW Discharge Note Marvetta Gibbons RN,BSN Transitions of Care Unit 4NP (Non Trauma)- RN Case Manager See Treatment Team for direct Phone #    Patient Details  Name: CROIX PRESLEY MRN: [id number] Date of Birth: 1945-11-01  Transition of Care [facility]) CM/SW Contact:  Dawayne Patricia, RN Phone Number: 02/25/2021, 1:22 PM   Clinical Narrative:    Noted pt with discharge order for today and HHPT order as well as DME 3n1.  CM in to speak with pt at bedside for transition needs.  Per conversation with pt he reports that his car is at [facility]- and he will need assist with transportation- pt can use [facility] [employer]- have alerted unit secretary to speak with bedside RN for timing of transportation need for set up.   Also discussed with pt HH and DME needs- per pt he does not need 3n1 for home- declines DME referral. Pt reports he has RW and other needed DME at home.   List provided for [facility] choice- Per CMS guidelines from medicare.gov website with star ratings (copy placed in shadow chart), after review pt has selected [facility] as first choice.   Address, phone # and PCP all confirmed with pt in epic.   Call made to [facility] with [facility] for HHPT referral- referral has been accepted- [facility] will also add RN for additional support.    Final next level of care: [place] Barriers to Discharge: No Barriers Identified   Patient Goals and CMS Choice Patient states their goals for this hospitalization and ongoing recovery are:: return home CMS Medicare.gov Compare Post Acute Care list provided to:: Patient Choice offered to / list presented to : Patient  Discharge Placement                 Home w/ [facility]      Discharge Plan and Services   Discharge Planning Services: CM Consult Post Acute Care Choice: Durable Medical Equipment, Home Health          DME Arranged: 3-N-1, Patient refused services DME Agency: NA       HH Arranged: PT, RN [place]  Agency: [place] ([facility]) Date HH Agency Contacted: 02/25/21 Time HH Agency Contacted: 1240 Representative spoke with at [place]: [place] ([place]) Interventions     Readmission Risk Interventions Readmission Risk Prevention Plan 02/25/2021  Transportation Screening Complete  Medication Review Press photographer) Complete  PCP or Specialist appointment within 3-5 days of discharge Complete  HRI or [place] Complete  SW Recovery Care/Counseling Consult Complete  [place] Not Applicable  Some recent data might be hidden

## 2021-02-25 NOTE — Progress Notes (Signed)
Overall doing well.  Minimal headache.  No other complaints.    Vital signs are   Stable.  He is awake and alert.  Oriented and appropriate.  Motor and sensory function intact.  Status post fall with mild traumatic subarachnoid hemorrhage.  Okay to mobilize with therapies.  Okay to work towards discharge home.  No new recommendations from our standpoint.

## 2021-02-26 DIAGNOSIS — J439 Emphysema, unspecified: Secondary | ICD-10-CM | POA: Diagnosis not present

## 2021-02-26 DIAGNOSIS — K219 Gastro-esophageal reflux disease without esophagitis: Secondary | ICD-10-CM | POA: Diagnosis not present

## 2021-02-26 DIAGNOSIS — I714 Abdominal aortic aneurysm, without rupture, unspecified: Secondary | ICD-10-CM | POA: Diagnosis not present

## 2021-02-26 DIAGNOSIS — E1140 Type 2 diabetes mellitus with diabetic neuropathy, unspecified: Secondary | ICD-10-CM | POA: Diagnosis not present

## 2021-02-26 DIAGNOSIS — I700 Atherosclerosis of aorta: Secondary | ICD-10-CM | POA: Diagnosis not present

## 2021-02-26 DIAGNOSIS — Z8616 Personal history of COVID-19: Secondary | ICD-10-CM | POA: Diagnosis not present

## 2021-02-26 DIAGNOSIS — I2510 Atherosclerotic heart disease of native coronary artery without angina pectoris: Secondary | ICD-10-CM | POA: Diagnosis not present

## 2021-02-26 DIAGNOSIS — N179 Acute kidney failure, unspecified: Secondary | ICD-10-CM | POA: Diagnosis not present

## 2021-02-26 DIAGNOSIS — Z9181 History of falling: Secondary | ICD-10-CM | POA: Diagnosis not present

## 2021-02-26 DIAGNOSIS — Z95820 Peripheral vascular angioplasty status with implants and grafts: Secondary | ICD-10-CM | POA: Diagnosis not present

## 2021-02-26 DIAGNOSIS — G9341 Metabolic encephalopathy: Secondary | ICD-10-CM | POA: Diagnosis not present

## 2021-02-26 DIAGNOSIS — I951 Orthostatic hypotension: Secondary | ICD-10-CM | POA: Diagnosis not present

## 2021-02-26 DIAGNOSIS — Z8546 Personal history of malignant neoplasm of prostate: Secondary | ICD-10-CM | POA: Diagnosis not present

## 2021-02-26 DIAGNOSIS — N3941 Urge incontinence: Secondary | ICD-10-CM | POA: Diagnosis not present

## 2021-02-26 DIAGNOSIS — S066X0D Traumatic subarachnoid hemorrhage without loss of consciousness, subsequent encounter: Secondary | ICD-10-CM | POA: Diagnosis not present

## 2021-02-26 DIAGNOSIS — G2581 Restless legs syndrome: Secondary | ICD-10-CM | POA: Diagnosis not present

## 2021-02-26 DIAGNOSIS — I129 Hypertensive chronic kidney disease with stage 1 through stage 4 chronic kidney disease, or unspecified chronic kidney disease: Secondary | ICD-10-CM | POA: Diagnosis not present

## 2021-02-26 DIAGNOSIS — E1122 Type 2 diabetes mellitus with diabetic chronic kidney disease: Secondary | ICD-10-CM | POA: Diagnosis not present

## 2021-02-26 DIAGNOSIS — G4733 Obstructive sleep apnea (adult) (pediatric): Secondary | ICD-10-CM | POA: Diagnosis not present

## 2021-02-26 DIAGNOSIS — N184 Chronic kidney disease, stage 4 (severe): Secondary | ICD-10-CM | POA: Diagnosis not present

## 2021-02-26 DIAGNOSIS — E785 Hyperlipidemia, unspecified: Secondary | ICD-10-CM | POA: Diagnosis not present

## 2021-02-26 DIAGNOSIS — F17210 Nicotine dependence, cigarettes, uncomplicated: Secondary | ICD-10-CM | POA: Diagnosis not present

## 2021-02-26 DIAGNOSIS — E876 Hypokalemia: Secondary | ICD-10-CM | POA: Diagnosis not present

## 2021-02-26 DIAGNOSIS — F32A Depression, unspecified: Secondary | ICD-10-CM | POA: Diagnosis not present

## 2021-02-26 DIAGNOSIS — N3281 Overactive bladder: Secondary | ICD-10-CM | POA: Diagnosis not present

## 2021-02-28 ENCOUNTER — Telehealth: Payer: Self-pay

## 2021-02-28 ENCOUNTER — Emergency Department (HOSPITAL_COMMUNITY): Payer: Medicare Other

## 2021-02-28 ENCOUNTER — Other Ambulatory Visit: Payer: Self-pay | Admitting: Cardiology

## 2021-02-28 ENCOUNTER — Emergency Department (HOSPITAL_COMMUNITY)
Admission: EM | Admit: 2021-02-28 | Discharge: 2021-03-01 | Disposition: A | Discharge status: Home / Self Care | Payer: Medicare Other | Attending: Emergency Medicine | Admitting: Emergency Medicine

## 2021-02-28 ENCOUNTER — Encounter (HOSPITAL_COMMUNITY): Payer: Self-pay | Admitting: Emergency Medicine

## 2021-02-28 ENCOUNTER — Telehealth: Payer: Self-pay | Admitting: Family Medicine

## 2021-02-28 DIAGNOSIS — Z79899 Other long term (current) drug therapy: Secondary | ICD-10-CM | POA: Diagnosis not present

## 2021-02-28 DIAGNOSIS — Y9301 Activity, walking, marching and hiking: Secondary | ICD-10-CM | POA: Diagnosis not present

## 2021-02-28 DIAGNOSIS — Z951 Presence of aortocoronary bypass graft: Secondary | ICD-10-CM | POA: Insufficient documentation

## 2021-02-28 DIAGNOSIS — F17210 Nicotine dependence, cigarettes, uncomplicated: Secondary | ICD-10-CM | POA: Diagnosis not present

## 2021-02-28 DIAGNOSIS — W01198A Fall on same level from slipping, tripping and stumbling with subsequent striking against other object, initial encounter: Secondary | ICD-10-CM | POA: Diagnosis not present

## 2021-02-28 DIAGNOSIS — E1122 Type 2 diabetes mellitus with diabetic chronic kidney disease: Secondary | ICD-10-CM | POA: Diagnosis not present

## 2021-02-28 DIAGNOSIS — G9341 Metabolic encephalopathy: Secondary | ICD-10-CM | POA: Diagnosis not present

## 2021-02-28 DIAGNOSIS — M50322 Other cervical disc degeneration at C5-C6 level: Secondary | ICD-10-CM | POA: Diagnosis not present

## 2021-02-28 DIAGNOSIS — N179 Acute kidney failure, unspecified: Secondary | ICD-10-CM | POA: Diagnosis not present

## 2021-02-28 DIAGNOSIS — Z8546 Personal history of malignant neoplasm of prostate: Secondary | ICD-10-CM | POA: Insufficient documentation

## 2021-02-28 DIAGNOSIS — I10 Essential (primary) hypertension: Secondary | ICD-10-CM | POA: Diagnosis not present

## 2021-02-28 DIAGNOSIS — S0083XA Contusion of other part of head, initial encounter: Secondary | ICD-10-CM | POA: Diagnosis not present

## 2021-02-28 DIAGNOSIS — Z96651 Presence of right artificial knee joint: Secondary | ICD-10-CM | POA: Insufficient documentation

## 2021-02-28 DIAGNOSIS — I2510 Atherosclerotic heart disease of native coronary artery without angina pectoris: Secondary | ICD-10-CM | POA: Diagnosis not present

## 2021-02-28 DIAGNOSIS — S066X0A Traumatic subarachnoid hemorrhage without loss of consciousness, initial encounter: Secondary | ICD-10-CM | POA: Diagnosis not present

## 2021-02-28 DIAGNOSIS — S0990XA Unspecified injury of head, initial encounter: Secondary | ICD-10-CM | POA: Diagnosis present

## 2021-02-28 DIAGNOSIS — W19XXXA Unspecified fall, initial encounter: Secondary | ICD-10-CM | POA: Diagnosis not present

## 2021-02-28 DIAGNOSIS — I951 Orthostatic hypotension: Secondary | ICD-10-CM | POA: Diagnosis not present

## 2021-02-28 DIAGNOSIS — N184 Chronic kidney disease, stage 4 (severe): Secondary | ICD-10-CM | POA: Insufficient documentation

## 2021-02-28 DIAGNOSIS — J449 Chronic obstructive pulmonary disease, unspecified: Secondary | ICD-10-CM | POA: Insufficient documentation

## 2021-02-28 DIAGNOSIS — I129 Hypertensive chronic kidney disease with stage 1 through stage 4 chronic kidney disease, or unspecified chronic kidney disease: Secondary | ICD-10-CM | POA: Diagnosis not present

## 2021-02-28 DIAGNOSIS — S066X0D Traumatic subarachnoid hemorrhage without loss of consciousness, subsequent encounter: Secondary | ICD-10-CM | POA: Diagnosis not present

## 2021-02-28 DIAGNOSIS — R0902 Hypoxemia: Secondary | ICD-10-CM | POA: Diagnosis not present

## 2021-02-28 DIAGNOSIS — R42 Dizziness and giddiness: Secondary | ICD-10-CM | POA: Diagnosis not present

## 2021-02-28 NOTE — Telephone Encounter (Signed)
Patient has appointment with Monia Pouch on 03/03/21.

## 2021-02-28 NOTE — ED Provider Notes (Signed)
[facility] EMERGENCY DEPARTMENT Provider Note   CSN: [id number] Arrival date & time: 02/28/21  2245     History Chief Complaint  Patient presents with   Revin Corker is a 75 y.o. male.  Patient presents to the emergency department for evaluation after a fall.  Patient reports that he was walking and his legs got moving faster than he could keep up, this caused him to fall forward.  He did hit the ground.  Patient complaining of chin pain.  He did not lose consciousness.  Patient concerned because he had a fall before Thanksgiving where he ended up having a subarachnoid bleed.  He is not having any new headache.      Past Medical History:  Diagnosis Date   Aortic atherosclerosis ([place])    2004  s/p  closure Penetrating atherosclerotic ulcer of infarenal aorta w/ endovascular stent graft   Arthritis    CKD (chronic kidney disease), stage III (HCC)    Closed fracture of head of humerus 07/2017   right shoulder from fall; 09-25-2017 per pt no surgical intervention, only wore sling, intermittant pain   Coronary atherosclerosis of native coronary artery    positive myoview for ischemia 09-27-2009;  10-01-2009 per cardiac cath-- minimal nonobstructive CAD w/ 30% LAD    ED (erectile dysfunction)    Emphysema/COPD ([place])    followed by pcp--- last exacerbation 09-12-2017;  09-25-2017 per pt no cough, sob or congestion   Essential hypertension    Family history of kidney cancer    First degree heart block    Frequency of urination    GERD (gastroesophageal reflux disease)    Gout    09-25-2017  per pt stable,  last episode 2015   Heart murmur    History of adenomatous polyp of colon    History of closed head injury 2005   per pt residual resolved   History of external beam radiation therapy    completed 10/ 2015 for prostate cancer   History of gastric ulcer 2014   History of kidney stones    History of pneumothorax    spontaneous pneumo treated w/ chest tube    History of rib fracture 07/2017   from fall,  right side 6th,7th,8th   Neuropathy    OAB (overactive bladder)    OSA (obstructive sleep apnea)    Intolerant of CPAP   Prostate cancer ([place])    dx 09-26-2011 via bx-- Stage T3b,N0,  Gleason 4+4, PSA 11.2 with METs to external iliac lymph node(resolved with ADT)-- treated w/ ADT ;   05/2013 staging work-up for rising PSA , started casodex and completed radiation therapy 10/ 2015;   rising PSA post treatment   RLS (restless legs syndrome)    Ureteral neocystostomy bleed    left side   Urge urinary incontinence     Patient Active Problem List   Diagnosis Date Noted   SAH (subarachnoid hemorrhage) ([place]) 02/24/2021   Subarachnoid bleed ([place]) 21/30/8657   Acute metabolic encephalopathy 84/69/6295   Incisional hernia, without obstruction or gangrene 28/41/3244   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 01/19/2020   Family history of kidney cancer    Chronic kidney disease (CKD), stage IV (severe) ([place]) 07/10/2018   Depression, recurrent ([place]) 04/08/2018   Neuropathy 03/17/2015   Prostate cancer ([place]) 06/30/2013   Abdominal aneurysm without mention of rupture 02/19/2013   Morbid obesity ([place]) 01/14/2013   VITAMIN B12 DEFICIENCY 01/14/2013   TOBACCO ABUSE 01/14/2013  PTSD 01/14/2013   COPD (chronic obstructive pulmonary disease) (HCC)    OSA (obstructive sleep apnea) 08/14/2012   RLS (restless legs syndrome) 08/14/2012   Prediabetes 08/14/2012   Aortic atherosclerosis ([place]) 02/19/2012   Coronary atherosclerosis of native coronary artery 03/23/2010   Hypokalemia 11/25/2009   GERD 07/30/2008   History of colonic polyps 07/30/2008   Benign prostatic hyperplasia with lower urinary tract symptoms 02/20/2008   Hyperlipidemia 01/31/2007   GOUT 01/31/2007   Essential hypertension, benign 01/31/2007    Past Surgical History:  Procedure Laterality Date   ABDOMINAL AORTIC ENDOVASCULAR STENT GRAFT  09-13-2007    dr [name]  [facility]   closure  penetrating atherosclerotic ulcer of infarenal aorta with stent graft   BIOPSY  03/06/2019   Procedure: BIOPSY;  Surgeon: [name], MD;  Location[facility];  Service: Endoscopy;;  gastric   CARDIAC CATHETERIZATION  2005   per pt normal (done in [place])   [place]  10/06/2009    dr cooper   minimal nonobstructive CAD w/30% LAD otherwise normal coronaries, LVEDP 15mmHg   CARDIOVASCULAR STRESS TEST  09/27/2009    dr Aundra Dubin   lexiscan nuclear study w/ moderate reversible inferior perfusion defect ischemia,  ef 60% (cardiac cath scheduled)   CATARACT EXTRACTION W/PHACO Right 07/12/2020   Procedure: CATARACT EXTRACTION PHACO AND INTRAOCULAR LENS PLACEMENT RIGHT EYE;  Surgeon: [name], MD;  Location: [facility] ORS;  Service: Ophthalmology;  Laterality: Right;  CDE=15.48   CHEST TUBE INSERTION  1989   "collapsed lung"due to injury   CIRCUMCISION  09/26/2011   Procedure: CIRCUMCISION ADULT;  Surgeon: [name], MD;  Location: [facility];  Service: Urology;  Laterality: N/A;   COLONOSCOPY W/ POLYPECTOMY     COLONOSCOPY WITH PROPOFOL N/A 04/27/2016    eight 4-8 mm polyps in descending colon, at splenic flexure, in ascending colon and cecum. Tubular adenomas. Surveillance in 3 years.   CYSTOSCOPY  09/26/2011   Procedure: CYSTOSCOPY;  Surgeon: [name], MD;  Location: [facility];  Service: Urology;  Laterality: N/A;   CYSTOSCOPY/RETROGRADE/URETEROSCOPY Left 09/27/2017   Procedure: CYSTOSCOPYLEFT /RETROGRADE/URETEROSCOPY AND RENAL WASHINGS;  Surgeon: [name], MD;  Location: [facility];  Service: Urology;  Laterality: Left;   ESOPHAGOGASTRODUODENOSCOPY (EGD) WITH PROPOFOL N/A 04/27/2016   normal esophagus s/p dilation, small hiatal hernia, normal duodenum   ESOPHAGOGASTRODUODENOSCOPY (EGD) WITH PROPOFOL N/A 03/06/2019   erosive reflux esophagitis, s/p dilation, normal duodenum, abnormal gastric mucosa s/p biopsy. Hyperplastic gastric polyp. No H.pylori.     EXTRACORPOREAL SHOCK WAVE LITHOTRIPSY  yrs ago   FRACTURE SURGERY  child   Bilateral lower arms    KNEE ARTHROSCOPY Right 2013   MALONEY DILATION N/A 04/27/2016   Procedure: Venia Minks DILATION;  Surgeon: [name], MD;  [facility];  Service: Endoscopy;  Laterality: N/A;   MALONEY DILATION N/A 03/06/2019   Procedure: Venia Minks DILATION;  Surgeon: [name], MD;  [facility];  Service: Endoscopy;  Laterality: N/A;   PARTIAL KNEE ARTHROPLASTY Right 04/26/2015   Procedure: RIGHT KNEE MEDIAL UNICOMPARTMENTAL ARTHROPLASTY;  Surgeon: Gaynelle Arabian, MD;  Location: [facility];  Service: Orthopedics;  Laterality: Right;   POLYPECTOMY  04/27/2016   Procedure: POLYPECTOMY;  Surgeon: [name], MD;  [facility];  Service: Endoscopy;;  cecal , ascending, descending, and sigmoid polypectomies   PROSTATE BIOPSY  09/26/2011   Procedure: BIOPSY TRANSRECTAL ULTRASONIC PROSTATE (TUBP);  Surgeon: [name], MD;  Location[facility];  Service: Urology;  Laterality: N/A;      TRANSTHORACIC ECHOCARDIOGRAM  01-07-2013   dr [name]   ef 60-65%,  grade 1 diastolic dysfunction/  mild AR without stenosis/  mild LAE/  mild to moderate calcificed MV annulus without regurg. or stenosis/   UMBILICAL HERNIA REPAIR  yrs ago       Family History  Problem Relation Age of Onset   Stroke Mother    Alzheimer's disease Mother 65       probably due to head injury   Mesothelioma Father 28   Kidney cancer Father    Hyperlipidemia Sister    Heart attack Sister    Kidney cancer Sister        dx in her 63s   Heart disease Brother    Kidney cancer Brother 57       dx in his 22s   Hyperlipidemia Brother    Kidney cancer Brother        dx in his 61s   Healthy Daughter    Healthy Son    Healthy Son    Colon cancer Neg Hx     Social History   Tobacco Use   Smoking status: Every Day    Packs/day: 0.50    Years: 42.00    Pack years: 21.00    Types: Cigarettes   Smokeless  tobacco: Never  Vaping Use   Vaping Use: Never used  Substance Use Topics   Alcohol use: No    Alcohol/week: 0.0 standard drinks   Drug use: No    Home Medications Prior to Admission medications   Medication Sig Start Date End Date Taking? Authorizing Provider  abiraterone acetate (ZYTIGA) 250 MG tablet Take 2 tablets (500 mg total) by mouth daily. Take on an empty stomach 1 hour before or 2 hours after a meal 10/27/20   Heath Lark, MD  acetaminophen (TYLENOL) 325 MG tablet Take 2 tablets (650 mg total) by mouth every 6 (six) hours as needed for mild pain (or Fever >/= 101). 02/25/21   Nita Sells, MD  albuterol (VENTOLIN HFA) 108 (90 Base) MCG/ACT inhaler Inhale 1-2 puffs into the lungs every 6 (six) hours as needed for wheezing or shortness of breath. 02/06/21   Tonye Pearson, PA-C  carvedilol (COREG) 25 MG tablet Take 0.5 tablets (12.5 mg total) by mouth 2 (two) times daily. 02/25/21   Nita Sells, MD  cloNIDine (CATAPRES) 0.1 MG tablet Take 1 tablet (0.1 mg total) by mouth 2 (two) times daily. Take for blood pressure > 180, and make sure u lay flat and get up very slowl if u take this--this can drop ur pressure and cause u to feel dizzy 02/25/21 02/25/22  Nita Sells, MD  DULoxetine (CYMBALTA) 60 MG capsule Take 1 capsule (60 mg total) by mouth daily. Take 1 capsule by mouth once daily (Needs to be seen before next refill) 05/19/20   [name], [name], MD  levETIRAcetam (KEPPRA) 500 MG tablet Take 1 tablet (500 mg total) by mouth 2 (two) times daily. 02/25/21   Nita Sells, MD  pantoprazole (PROTONIX) 40 MG tablet TAKE 1 TABLET BY MOUTH TWICE DAILY BEFORE A MEAL 01/12/21   Gwenlyn Perking, FNP  rosuvastatin (CRESTOR) 20 MG tablet TAKE 1 TABLET BY MOUTH ONCE DAILY -  STOP  PRAVASTATIN -- NEEDS FOLLOW UP APPOINTMENT FOR REFILLS Patient taking differently: Take 20 mg by mouth See admin instructions. TAKE 1 TABLET BY MOUTH ONCE DAILY -  STOP  PRAVASTATIN  --  NEEDS FOLLOW UP APPOINTMENT FOR REFILLS 09/30/20   Satira Sark, MD  tamsulosin (FLOMAX) 0.4 MG CAPS capsule Take 1 capsule (0.4 mg total) by mouth daily. 04/29/20   [name], MD    Allergies    Carbidopa-levodopa, Morphine sulfate, Sulfa antibiotics, and Sulfacetamide sodium  Review of Systems   Review of Systems  HENT:  Positive for facial swelling.   All other systems reviewed and are negative.  Physical Exam Updated Vital Signs BP (!) 149/92   Pulse (!) 56   Temp (!) 97.4 F (36.3 C) (Oral)   Resp 20   Ht 5\' 8"  (1.727 m)   Wt 104.3 kg   SpO2 98%   BMI 34.97 kg/m   Physical Exam Vitals and nursing note reviewed.  Constitutional:      General: He is not in acute distress.    Appearance: Normal appearance. He is well-developed.  HENT:     Head: Normocephalic and atraumatic.     Jaw: Tenderness present. No swelling or malocclusion.     Right Ear: Hearing normal.     Left Ear: Hearing normal.     Nose: Nose normal.  Eyes:     Conjunctiva/sclera: Conjunctivae normal.     Pupils: Pupils are equal, round, and reactive to light.  Cardiovascular:     Rate and Rhythm: Regular rhythm.     Heart sounds: S1 normal and S2 normal. No murmur heard.   No friction rub. No gallop.  Pulmonary:     Effort: Pulmonary effort is normal. No respiratory distress.     Breath sounds: Normal breath sounds.  Chest:     Chest wall: No tenderness.  Abdominal:     General: Bowel sounds are normal.     Palpations: Abdomen is soft.     Tenderness: There is no abdominal tenderness. There is no guarding or rebound. Negative signs include Murphy's sign and McBurney's sign.     Hernia: No hernia is present.  Musculoskeletal:        General: Normal range of motion.     Cervical back: Normal range of motion and neck supple.  Skin:    General: Skin is warm and dry.     Findings: No rash.  Neurological:     Mental Status: He is alert and oriented to person, place, and time.     GCS:  GCS eye subscore is 4. GCS verbal subscore is 5. GCS motor subscore is 6.     Cranial Nerves: No cranial nerve deficit.     Sensory: No sensory deficit.     Coordination: Coordination normal.  Psychiatric:        Speech: Speech normal.        Behavior: Behavior normal.        Thought Content: Thought content normal.    ED Results / Procedures / Treatments   Labs (all labs ordered are listed, but only abnormal results are displayed) Labs Reviewed - No data to display  EKG None  Radiology CT HEAD WO CONTRAST (5MM)  Result Date: 03/01/2021 CLINICAL DATA:  Trauma. EXAM: CT HEAD WITHOUT CONTRAST CT MAXILLOFACIAL WITHOUT CONTRAST CT CERVICAL SPINE WITHOUT CONTRAST TECHNIQUE: Multidetector CT imaging of the head, cervical spine, and maxillofacial structures were performed using the standard protocol without intravenous contrast. Multiplanar CT image reconstructions of the cervical spine and maxillofacial structures were also generated. COMPARISON:  Head CT dated 02/24/2021. FINDINGS: CT HEAD FINDINGS Brain: Mild age-related atrophy and chronic microvascular ischemic changes. Residual subarachnoid and  intraparenchymal hemorrhage over the left frontal lobe and anterior right falx as well as areas of hemorrhage involving the inferior tip of the temporal lobes appears similar to prior CT or slightly decreased in attenuation. Tiny layering blood noted in the occipital horn of the left lateral ventricle. No new hemorrhage. No mass effect or midline shift. Vascular: No hyperdense vessel or unexpected calcification. Skull: Normal. Negative for fracture or focal lesion. Other: None CT MAXILLOFACIAL FINDINGS Osseous: No fracture or mandibular dislocation. No destructive process. Orbits: Negative. No traumatic or inflammatory finding. Sinuses: Clear. Soft tissues: Negative. CT CERVICAL SPINE FINDINGS Alignment: No acute subluxation. Skull base and vertebrae: No acute fracture. Soft tissues and spinal canal: No  prevertebral fluid or swelling. No visible canal hematoma. Disc levels:  Degenerative changes at C5-C6. Upper chest: Negative. Other: Bilateral carotid bulb calcified plaques. IMPRESSION: 1. Residual intracranial subarachnoid and intraparenchymal hemorrhages. No new hemorrhage. No mass effect or midline shift. 2. No acute/traumatic cervical spine pathology. 3. No acute facial bone fractures. Electronically Signed   By: Anner Crete M.D.   On: 03/01/2021 00:11   CT CERVICAL SPINE WO CONTRAST  Result Date: 03/01/2021 CLINICAL DATA:  Trauma. EXAM: CT HEAD WITHOUT CONTRAST CT MAXILLOFACIAL WITHOUT CONTRAST CT CERVICAL SPINE WITHOUT CONTRAST TECHNIQUE: Multidetector CT imaging of the head, cervical spine, and maxillofacial structures were performed using the standard protocol without intravenous contrast. Multiplanar CT image reconstructions of the cervical spine and maxillofacial structures were also generated. COMPARISON:  Head CT dated 02/24/2021. FINDINGS: CT HEAD FINDINGS Brain: Mild age-related atrophy and chronic microvascular ischemic changes. Residual subarachnoid and intraparenchymal hemorrhage over the left frontal lobe and anterior right falx as well as areas of hemorrhage involving the inferior tip of the temporal lobes appears similar to prior CT or slightly decreased in attenuation. Tiny layering blood noted in the occipital horn of the left lateral ventricle. No new hemorrhage. No mass effect or midline shift. Vascular: No hyperdense vessel or unexpected calcification. Skull: Normal. Negative for fracture or focal lesion. Other: None CT MAXILLOFACIAL FINDINGS Osseous: No fracture or mandibular dislocation. No destructive process. Orbits: Negative. No traumatic or inflammatory finding. Sinuses: Clear. Soft tissues: Negative. CT CERVICAL SPINE FINDINGS Alignment: No acute subluxation. Skull base and vertebrae: No acute fracture. Soft tissues and spinal canal: No prevertebral fluid or swelling. No  visible canal hematoma. Disc levels:  Degenerative changes at C5-C6. Upper chest: Negative. Other: Bilateral carotid bulb calcified plaques. IMPRESSION: 1. Residual intracranial subarachnoid and intraparenchymal hemorrhages. No new hemorrhage. No mass effect or midline shift. 2. No acute/traumatic cervical spine pathology. 3. No acute facial bone fractures. Electronically Signed   By: Anner Crete M.D.   On: 03/01/2021 00:11   CT MAXILLOFACIAL WO CONTRAST  Result Date: 03/01/2021 CLINICAL DATA:  Trauma. EXAM: CT HEAD WITHOUT CONTRAST CT MAXILLOFACIAL WITHOUT CONTRAST CT CERVICAL SPINE WITHOUT CONTRAST TECHNIQUE: Multidetector CT imaging of the head, cervical spine, and maxillofacial structures were performed using the standard protocol without intravenous contrast. Multiplanar CT image reconstructions of the cervical spine and maxillofacial structures were also generated. COMPARISON:  Head CT dated 02/24/2021. FINDINGS: CT HEAD FINDINGS Brain: Mild age-related atrophy and chronic microvascular ischemic changes. Residual subarachnoid and intraparenchymal hemorrhage over the left frontal lobe and anterior right falx as well as areas of hemorrhage involving the inferior tip of the temporal lobes appears similar to prior CT or slightly decreased in attenuation. Tiny layering blood noted in the occipital horn of the left lateral ventricle. No new hemorrhage. No mass effect or  midline shift. Vascular: No hyperdense vessel or unexpected calcification. Skull: Normal. Negative for fracture or focal lesion. Other: None CT MAXILLOFACIAL FINDINGS Osseous: No fracture or mandibular dislocation. No destructive process. Orbits: Negative. No traumatic or inflammatory finding. Sinuses: Clear. Soft tissues: Negative. CT CERVICAL SPINE FINDINGS Alignment: No acute subluxation. Skull base and vertebrae: No acute fracture. Soft tissues and spinal canal: No prevertebral fluid or swelling. No visible canal hematoma. Disc levels:   Degenerative changes at C5-C6. Upper chest: Negative. Other: Bilateral carotid bulb calcified plaques. IMPRESSION: 1. Residual intracranial subarachnoid and intraparenchymal hemorrhages. No new hemorrhage. No mass effect or midline shift. 2. No acute/traumatic cervical spine pathology. 3. No acute facial bone fractures. Electronically Signed   By: Anner Crete M.D.   On: 03/01/2021 00:11    Procedures Procedures   Medications Ordered in ED Medications - No data to display  ED Course  I have reviewed the triage vital signs and the nursing notes.  Pertinent labs & imaging results that were available during my care of the patient were reviewed by me and considered in my medical decision making (see chart for details).    MDM Rules/Calculators/A&P                           Patient presents to the emergency department for evaluation of fall.  Patient has had recent frequent falls secondary to leg giving out and instability issues.  He had a mechanical fall tonight.  Patient complaining mainly of chin pain but he does had a recent subarachnoid hemorrhage from a fall.  He had some slight neck discomfort as well.  Patient underwent CT head, cervical spine, maxillofacial bones and there are no fractures.  Residual subarachnoid is noted but no new bleed.  Patient reassured, no further work-up necessary at this time.  Can follow-up with his primary doctor for his instability issues.  Final Clinical Impression(s) / ED Diagnoses Final diagnoses:  Contusion of face, initial encounter    Rx / DC Orders ED Discharge Orders     None        Liddy Deam, Gwenyth Allegra, MD 03/01/21 0028

## 2021-02-28 NOTE — ED Triage Notes (Signed)
Pt arrives [facility] from home after mechanical fall. Pt c/o chin pain only.   Pt had recently fall and was admitted due to subarachnoid bleed.

## 2021-02-28 NOTE — Telephone Encounter (Signed)
Transition Care Management Follow-up Telephone Call Date of discharge and from where: [facility] Diagnosis: Orthostatic hypotension, s/p fall with subarachnoid hemorrhage. How have you been since you were released from the hospital? Pt's wife [name] states pt is doing well. Resting. Any questions or concerns? No  Items Reviewed: Did the pt receive and understand the discharge instructions provided? Yes  Medications obtained and verified? Yes  Other? No  Any new allergies since your discharge? No  Dietary orders reviewed? Yes Do you have support at home? Yes   Home Care and Equipment/Supplies: Were home health services ordered? yes If so, what is the name of the agency? [facility]  Has the agency set up a time to come to the patient's home? yes Were any new equipment or medical supplies ordered?  No What is the name of the medical supply agency? N/A Were you able to get the supplies/equipment? not applicable Do you have any questions related to the use of the equipment or supplies? No  Functional Questionnaire: (I = Independent and D = Dependent) ADLs: I  Bathing/Dressing- I  Meal Prep- I  Eating- I  Maintaining continence- I  Transferring/Ambulation- I  Managing Meds- I  Follow up appointments reviewed:  PCP Hospital f/u appt confirmed? Yes  Scheduled to see Darla Lesches on 03/03/21 @ 2:50. [place] [facility] f/u appt confirmed?  N/A   Are transportation arrangements needed? No  If their condition worsens, is the pt aware to call PCP or go to the Emergency Dept.? Yes Was the patient provided with contact information for the PCP's office or ED? Yes Was to pt encouraged to call back with questions or concerns? Yes

## 2021-02-28 NOTE — Telephone Encounter (Signed)
No answer, no voicemail.

## 2021-03-02 ENCOUNTER — Telehealth: Payer: Self-pay | Admitting: Family Medicine

## 2021-03-02 ENCOUNTER — Emergency Department: Payer: Medicare Other

## 2021-03-02 ENCOUNTER — Other Ambulatory Visit: Payer: Self-pay

## 2021-03-02 ENCOUNTER — Inpatient Hospital Stay
Admission: EM | Admit: 2021-03-02 | Discharge: 2021-03-05 | DRG: 392 | Disposition: A | Discharge status: Home Health | Payer: Medicare Other | Attending: Internal Medicine | Admitting: Internal Medicine

## 2021-03-02 DIAGNOSIS — I10 Essential (primary) hypertension: Secondary | ICD-10-CM | POA: Diagnosis not present

## 2021-03-02 DIAGNOSIS — I611 Nontraumatic intracerebral hemorrhage in hemisphere, cortical: Secondary | ICD-10-CM | POA: Diagnosis not present

## 2021-03-02 DIAGNOSIS — S066X0D Traumatic subarachnoid hemorrhage without loss of consciousness, subsequent encounter: Secondary | ICD-10-CM

## 2021-03-02 DIAGNOSIS — Z9181 History of falling: Secondary | ICD-10-CM

## 2021-03-02 DIAGNOSIS — B379 Candidiasis, unspecified: Secondary | ICD-10-CM | POA: Diagnosis present

## 2021-03-02 DIAGNOSIS — A0839 Other viral enteritis: Principal | ICD-10-CM | POA: Diagnosis present

## 2021-03-02 DIAGNOSIS — Z8051 Family history of malignant neoplasm of kidney: Secondary | ICD-10-CM

## 2021-03-02 DIAGNOSIS — A084 Viral intestinal infection, unspecified: Secondary | ICD-10-CM | POA: Diagnosis present

## 2021-03-02 DIAGNOSIS — F32A Depression, unspecified: Secondary | ICD-10-CM | POA: Diagnosis present

## 2021-03-02 DIAGNOSIS — R531 Weakness: Secondary | ICD-10-CM | POA: Diagnosis not present

## 2021-03-02 DIAGNOSIS — Z8349 Family history of other endocrine, nutritional and metabolic diseases: Secondary | ICD-10-CM

## 2021-03-02 DIAGNOSIS — M879 Osteonecrosis, unspecified: Secondary | ICD-10-CM | POA: Diagnosis not present

## 2021-03-02 DIAGNOSIS — E876 Hypokalemia: Secondary | ICD-10-CM | POA: Diagnosis present

## 2021-03-02 DIAGNOSIS — K219 Gastro-esophageal reflux disease without esophagitis: Secondary | ICD-10-CM | POA: Diagnosis present

## 2021-03-02 DIAGNOSIS — N179 Acute kidney failure, unspecified: Secondary | ICD-10-CM | POA: Diagnosis not present

## 2021-03-02 DIAGNOSIS — I129 Hypertensive chronic kidney disease with stage 1 through stage 4 chronic kidney disease, or unspecified chronic kidney disease: Secondary | ICD-10-CM | POA: Diagnosis present

## 2021-03-02 DIAGNOSIS — Z8616 Personal history of COVID-19: Secondary | ICD-10-CM | POA: Diagnosis not present

## 2021-03-02 DIAGNOSIS — Z8546 Personal history of malignant neoplasm of prostate: Secondary | ICD-10-CM

## 2021-03-02 DIAGNOSIS — E1122 Type 2 diabetes mellitus with diabetic chronic kidney disease: Secondary | ICD-10-CM | POA: Diagnosis not present

## 2021-03-02 DIAGNOSIS — Z8249 Family history of ischemic heart disease and other diseases of the circulatory system: Secondary | ICD-10-CM

## 2021-03-02 DIAGNOSIS — Z820 Family history of epilepsy and other diseases of the nervous system: Secondary | ICD-10-CM

## 2021-03-02 DIAGNOSIS — U071 COVID-19: Secondary | ICD-10-CM | POA: Diagnosis present

## 2021-03-02 DIAGNOSIS — I951 Orthostatic hypotension: Secondary | ICD-10-CM | POA: Diagnosis not present

## 2021-03-02 DIAGNOSIS — M1990 Unspecified osteoarthritis, unspecified site: Secondary | ICD-10-CM | POA: Diagnosis present

## 2021-03-02 DIAGNOSIS — G2581 Restless legs syndrome: Secondary | ICD-10-CM | POA: Diagnosis present

## 2021-03-02 DIAGNOSIS — I2510 Atherosclerotic heart disease of native coronary artery without angina pectoris: Secondary | ICD-10-CM | POA: Diagnosis present

## 2021-03-02 DIAGNOSIS — N400 Enlarged prostate without lower urinary tract symptoms: Secondary | ICD-10-CM | POA: Diagnosis present

## 2021-03-02 DIAGNOSIS — F17200 Nicotine dependence, unspecified, uncomplicated: Secondary | ICD-10-CM | POA: Diagnosis present

## 2021-03-02 DIAGNOSIS — S066X0A Traumatic subarachnoid hemorrhage without loss of consciousness, initial encounter: Secondary | ICD-10-CM | POA: Diagnosis present

## 2021-03-02 DIAGNOSIS — R9431 Abnormal electrocardiogram [ECG] [EKG]: Secondary | ICD-10-CM

## 2021-03-02 DIAGNOSIS — G9341 Metabolic encephalopathy: Secondary | ICD-10-CM | POA: Diagnosis present

## 2021-03-02 DIAGNOSIS — R296 Repeated falls: Secondary | ICD-10-CM | POA: Diagnosis present

## 2021-03-02 DIAGNOSIS — Z885 Allergy status to narcotic agent status: Secondary | ICD-10-CM

## 2021-03-02 DIAGNOSIS — E860 Dehydration: Secondary | ICD-10-CM | POA: Diagnosis not present

## 2021-03-02 DIAGNOSIS — U099 Post covid-19 condition, unspecified: Secondary | ICD-10-CM | POA: Diagnosis present

## 2021-03-02 DIAGNOSIS — Z83438 Family history of other disorder of lipoprotein metabolism and other lipidemia: Secondary | ICD-10-CM

## 2021-03-02 DIAGNOSIS — F339 Major depressive disorder, recurrent, unspecified: Secondary | ICD-10-CM | POA: Diagnosis present

## 2021-03-02 DIAGNOSIS — R001 Bradycardia, unspecified: Secondary | ICD-10-CM | POA: Diagnosis present

## 2021-03-02 DIAGNOSIS — E785 Hyperlipidemia, unspecified: Secondary | ICD-10-CM | POA: Diagnosis present

## 2021-03-02 DIAGNOSIS — N184 Chronic kidney disease, stage 4 (severe): Secondary | ICD-10-CM | POA: Diagnosis present

## 2021-03-02 DIAGNOSIS — B370 Candidal stomatitis: Secondary | ICD-10-CM | POA: Diagnosis present

## 2021-03-02 DIAGNOSIS — Z888 Allergy status to other drugs, medicaments and biological substances status: Secondary | ICD-10-CM

## 2021-03-02 DIAGNOSIS — Z79899 Other long term (current) drug therapy: Secondary | ICD-10-CM

## 2021-03-02 DIAGNOSIS — I609 Nontraumatic subarachnoid hemorrhage, unspecified: Secondary | ICD-10-CM

## 2021-03-02 DIAGNOSIS — F17210 Nicotine dependence, cigarettes, uncomplicated: Secondary | ICD-10-CM | POA: Diagnosis present

## 2021-03-02 DIAGNOSIS — J449 Chronic obstructive pulmonary disease, unspecified: Secondary | ICD-10-CM | POA: Diagnosis present

## 2021-03-02 DIAGNOSIS — E669 Obesity, unspecified: Secondary | ICD-10-CM | POA: Diagnosis present

## 2021-03-02 DIAGNOSIS — Z823 Family history of stroke: Secondary | ICD-10-CM

## 2021-03-02 DIAGNOSIS — G4733 Obstructive sleep apnea (adult) (pediatric): Secondary | ICD-10-CM | POA: Diagnosis present

## 2021-03-02 DIAGNOSIS — Z923 Personal history of irradiation: Secondary | ICD-10-CM

## 2021-03-02 DIAGNOSIS — Z6835 Body mass index (BMI) 35.0-35.9, adult: Secondary | ICD-10-CM

## 2021-03-02 DIAGNOSIS — Z882 Allergy status to sulfonamides status: Secondary | ICD-10-CM

## 2021-03-02 DIAGNOSIS — J439 Emphysema, unspecified: Secondary | ICD-10-CM | POA: Diagnosis present

## 2021-03-02 DIAGNOSIS — Z7984 Long term (current) use of oral hypoglycemic drugs: Secondary | ICD-10-CM

## 2021-03-02 LAB — RESP PANEL BY RT-PCR (FLU A&B, COVID) ARPGX2
Influenza A by PCR: NEGATIVE
Influenza B by PCR: NEGATIVE
SARS Coronavirus 2 by RT PCR: POSITIVE — AB

## 2021-03-02 LAB — TSH: TSH: 3.22 u[IU]/mL (ref 0.350–4.500)

## 2021-03-02 LAB — CBC
HCT: 31.7 % — ABNORMAL LOW (ref 39.0–52.0)
Hemoglobin: 10.9 g/dL — ABNORMAL LOW (ref 13.0–17.0)
MCH: 30.3 pg (ref 26.0–34.0)
MCHC: 34.4 g/dL (ref 30.0–36.0)
MCV: 88.1 fL (ref 80.0–100.0)
Platelets: 189 10*3/uL (ref 150–400)
RBC: 3.6 MIL/uL — ABNORMAL LOW (ref 4.22–5.81)
RDW: 14.4 % (ref 11.5–15.5)
WBC: 6.1 10*3/uL (ref 4.0–10.5)
nRBC: 0 % (ref 0.0–0.2)

## 2021-03-02 LAB — PROCALCITONIN: Procalcitonin: <0.1 ng/mL

## 2021-03-02 LAB — BASIC METABOLIC PANEL
Anion gap: 7 (ref 5–15)
BUN: 24 mg/dL — ABNORMAL HIGH (ref 8–23)
CO2: 24 mmol/L (ref 22–32)
Calcium: 8.2 mg/dL — ABNORMAL LOW (ref 8.9–10.3)
Chloride: 104 mmol/L (ref 98–111)
Creatinine, Ser: 2.59 mg/dL — ABNORMAL HIGH (ref 0.61–1.24)
GFR, Estimated: 25 mL/min — ABNORMAL LOW (ref >60)
Glucose, Bld: 112 mg/dL — ABNORMAL HIGH (ref 70–99)
Potassium: 2.6 mmol/L — CL (ref 3.5–5.1)
Sodium: 135 mmol/L (ref 135–145)

## 2021-03-02 LAB — TROPONIN I (HIGH SENSITIVITY)
Troponin I (High Sensitivity): 23 ng/L — ABNORMAL HIGH (ref <18)
Troponin I (High Sensitivity): 19 ng/L — ABNORMAL HIGH (ref <18)

## 2021-03-02 LAB — CBG MONITORING, ED: Glucose-Capillary: 85 mg/dL (ref 70–99)

## 2021-03-02 LAB — MAGNESIUM: Magnesium: 2.2 mg/dL (ref 1.7–2.4)

## 2021-03-02 MED ORDER — POTASSIUM CHLORIDE 10 MEQ/100ML IV SOLN
10.0000 meq | INTRAVENOUS | Status: AC
  Administered 2021-03-02 (×2): 10 meq via INTRAVENOUS
  Filled 2021-03-02 (×2): qty 100

## 2021-03-02 MED ORDER — CARVEDILOL 12.5 MG PO TABS
12.5000 mg | ORAL_TABLET | Freq: Two times a day (BID) | ORAL | Status: DC
  Administered 2021-03-02 – 2021-03-03 (×3): 12.5 mg via ORAL
  Filled 2021-03-02 (×2): qty 2
  Filled 2021-03-02: qty 1

## 2021-03-02 MED ORDER — DULOXETINE HCL 30 MG PO CPEP
60.0000 mg | ORAL_CAPSULE | Freq: Every day | ORAL | Status: DC
  Administered 2021-03-02 – 2021-03-05 (×4): 60 mg via ORAL
  Filled 2021-03-02 (×2): qty 1
  Filled 2021-03-02 (×2): qty 2

## 2021-03-02 MED ORDER — ACETAMINOPHEN 500 MG PO TABS: 1000.0000 mg | ORAL_TABLET | Freq: Four times a day (QID) | ORAL | Status: DC | PRN

## 2021-03-02 MED ORDER — POTASSIUM CHLORIDE 20 MEQ PO PACK
40.0000 meq | PACK | ORAL | Status: AC
  Administered 2021-03-02: 40 meq via ORAL
  Filled 2021-03-02: qty 2

## 2021-03-02 MED ORDER — HYDRALAZINE HCL 10 MG PO TABS
10.0000 mg | ORAL_TABLET | Freq: Four times a day (QID) | ORAL | Status: DC | PRN
  Filled 2021-03-02: qty 1

## 2021-03-02 MED ORDER — NYSTATIN 100000 UNIT/ML MT SUSP
5.0000 mL | Freq: Four times a day (QID) | OROMUCOSAL | Status: DC
  Administered 2021-03-03 – 2021-03-05 (×7): 500000 [IU] via ORAL
  Filled 2021-03-02 (×11): qty 5

## 2021-03-02 MED ORDER — SODIUM CHLORIDE 0.9 % IV BOLUS
500.0000 mL | Freq: Once | INTRAVENOUS | Status: AC
  Administered 2021-03-02: 500 mL via INTRAVENOUS

## 2021-03-02 MED ORDER — ACETAMINOPHEN 650 MG RE SUPP
650.0000 mg | Freq: Four times a day (QID) | RECTAL | Status: DC | PRN
  Filled 2021-03-02: qty 1

## 2021-03-02 MED ORDER — ONDANSETRON HCL 4 MG PO TABS: 4.0000 mg | ORAL_TABLET | Freq: Four times a day (QID) | ORAL | Status: DC | PRN

## 2021-03-02 MED ORDER — INSULIN ASPART 100 UNIT/ML IJ SOLN: 0.0000 [IU] | Freq: Every day | INTRAMUSCULAR | Status: DC

## 2021-03-02 MED ORDER — ABIRATERONE ACETATE 250 MG PO TABS: 500.0000 mg | ORAL_TABLET | Freq: Every day | ORAL | Status: DC

## 2021-03-02 MED ORDER — LEVETIRACETAM 500 MG PO TABS
500.0000 mg | ORAL_TABLET | Freq: Two times a day (BID) | ORAL | Status: DC
  Administered 2021-03-02 – 2021-03-05 (×6): 500 mg via ORAL
  Filled 2021-03-02 (×6): qty 1

## 2021-03-02 MED ORDER — ALBUTEROL SULFATE HFA 108 (90 BASE) MCG/ACT IN AERS
1.0000 | INHALATION_SPRAY | Freq: Four times a day (QID) | RESPIRATORY_TRACT | Status: DC | PRN
Start: 2021-03-02 — End: 2021-03-05
  Filled 2021-03-02: qty 6.7

## 2021-03-02 MED ORDER — ONDANSETRON HCL 4 MG/2ML IJ SOLN: 4.0000 mg | Freq: Four times a day (QID) | INTRAMUSCULAR | Status: DC | PRN

## 2021-03-02 MED ORDER — ROSUVASTATIN CALCIUM 10 MG PO TABS
20.0000 mg | ORAL_TABLET | Freq: Every day | ORAL | Status: DC
  Administered 2021-03-04 – 2021-03-05 (×2): 20 mg via ORAL
  Filled 2021-03-02 (×2): qty 2
  Filled 2021-03-02: qty 1

## 2021-03-02 MED ORDER — PANTOPRAZOLE SODIUM 40 MG PO TBEC
40.0000 mg | DELAYED_RELEASE_TABLET | Freq: Two times a day (BID) | ORAL | Status: DC
  Administered 2021-03-03 – 2021-03-05 (×4): 40 mg via ORAL
  Filled 2021-03-02 (×4): qty 1

## 2021-03-02 MED ORDER — INSULIN ASPART 100 UNIT/ML IJ SOLN: 0.0000 [IU] | Freq: Three times a day (TID) | INTRAMUSCULAR | Status: DC

## 2021-03-02 NOTE — ED Notes (Signed)
Pts Wife called and provided an update.

## 2021-03-02 NOTE — H&P (Addendum)
History and Physical   CHEO SELVEY WCB:[id number] DOB: 1946-03-31 DOA: 03/02/2021  PCP: [name], [name], MD  Outpatient Specialists: Dr. Theador Hawthorne, nephrology Patient coming from: home   I have personally briefly reviewed patient's old medical records in [place].  Chief Concern: multiple falls and inability to care for himself  HPI: [name] is a 75 y.o. male with medical history significant for Hypertension, depression, GERD, BPH, history of frequent falls, subarachnoid and intraparenchymal hemorrhage at the left frontal lobe, right falx, and inferior temporal lobes, who presents emergency department from home for chief concerns of frequent falls.  He reports he has been feeling generalized weakness for the last 3 weeks.  He denies any changes to his oral intake, fever, chest pain, shortness of breath, abdominal pain, dysuria, hematuria, diarrhea..  He reports that he recently came back from a trip to [facility] and tested positive for [place].  He reports that his weakness was present prior to the subarachnoid hemorrhage.  His wife recently fell and broke her hip and is home from 8-9 weeks of rehab and he feels that he needs to be home to help his wife.  Social history: He is lives at home with his wife of 8 years. He smokes about 1/2 ppd. He denies etoh, recreational drug use. He was in the [employer] and then he worked the the [employer].   Vaccination history: He is vaccinated for covid 19, two doses.   ROS: Constitutional: no weight change, no fever ENT/Mouth: no sore throat, no rhinorrhea Eyes: no eye pain, no vision changes Cardiovascular: no chest pain, no dyspnea,  no edema, no palpitations Respiratory: no cough, no sputum, no wheezing Gastrointestinal: no nausea, no vomiting, no diarrhea, no constipation Genitourinary: no urinary incontinence, no dysuria, no hematuria Musculoskeletal: no arthralgias, no  myalgias Skin: no skin lesions, no pruritus, Neuro: + weakness, no loss of consciousness, no syncope Psych: no anxiety, no depression, + decrease appetite Heme/Lymph: no bruising, no bleeding  ED Course: Discussed with emergency medicine provider, patient requiring hospitalization for chief concerns of frequent falls in setting of a stable subarachnoid and intraparenchymal hemorrhage.  Vitals in the emergency department was remarkable for temperature of 97.6, respiration rate of 18, heart rate of 66, blood pressure initially 124/72 and increased to 169/99, SPO2 100% on room air.  Labs in the emergency department was remarkable for serum sodium 135, potassium 2.6, chloride 104, bicarb 24, BUN of 24, serum creatinine of 2.59, nonfasting blood glucose 112, GFR 25, WBC 6.1, hemoglobin 10.9, platelets 189.  High sensitive troponin was elevated 23.  COVID PCR was positive on 11/23.  In the emergency department patient was given potassium chloride 10 mill equivalent x2 by IV, potassium chloride 40 mill equivalents p.o. once.  Sodium chloride 500 mL bolus.  Assessment/Plan  Principal Problem:   Hypokalemia due to excessive gastrointestinal loss of potassium Active Problems:   Hyperlipidemia   GERD   OSA (obstructive sleep apnea)   RLS (restless legs syndrome)   COPD (chronic obstructive pulmonary disease) (HCC)   Morbid obesity (HCC)   TOBACCO ABUSE   Depression, recurrent (HCC)   Chronic kidney disease (CKD), stage IV (severe) (HCC)   Acute metabolic encephalopathy   SAH (subarachnoid hemorrhage) ([place])   Essential hypertension   Thrush   # Weakness and frequent falling-checking B12, procalcitonin, TSH  - Fall precautions  # Hypokalemia-presumed secondary to COVID GI gastroenteritis - Status post replacement by EDP with potassium chloride 10  mill equivalent IV x2, potassium chloride 40 mEq p.o. one-time dose ordered - Time to recheck of potassium at 2300 on day of admission  #  Hypertension - resumed carvedilol 12.5 mg BID - Holding home clonidine at this time due to patient presentation with dizziness and frequent falling  # CKD 4-at baseline - Patient's baseline is serum creatinine 2.3-3.18, GFR 20-29 in the last 3 months  # Hyperlipidemia-resumed rosuvastatin 20 mg daily  # GERD-PPI resumed  # Non-insulin-dependent diabetes mellitus-insulin SSI with at bedtime coverage ordered - Patient currently not taking any antibiotic hyperglycemia oral medications and or insulin  # BPH-holding home tamsulosin daily at this time due to patient presentation of dizziness and frequent follow-up  # Thrush-present admission - Started patient on nystatin swish and swallow 4 times daily  No pharmacologic DVT prophylaxis at this time due to patient has a subarachnoid hemorrhage  Chart reviewed.   DVT prophylaxis: TED hose only Code Status: full code  Diet: Heart healthy Family Communication: none,  Disposition Plan: Pending clinical course Consults called: None at this time Admission status: Medical Telemetry, observation  Past Medical History:  Diagnosis Date   Aortic atherosclerosis ([place])    2004  s/p  closure Penetrating atherosclerotic ulcer of infarenal aorta w/ endovascular stent graft   Arthritis    CKD (chronic kidney disease), stage III ([place])    Closed fracture of head of humerus 07/2017   right shoulder from fall; 09-25-2017 per pt no surgical intervention, only wore sling, intermittant pain   Coronary atherosclerosis of native coronary artery    positive myoview for ischemia 09-27-2009;  10-01-2009 per cardiac cath-- minimal nonobstructive CAD w/ 30% LAD    ED (erectile dysfunction)    Emphysema/COPD ([place])    followed by pcp--- last exacerbation 09-12-2017;  09-25-2017 per pt no cough, sob or congestion   Essential hypertension    Family history of kidney cancer    First degree heart block    Frequency of urination    GERD (gastroesophageal reflux  disease)    Gout    09-25-2017  per pt stable,  last episode 2015   Heart murmur    History of adenomatous polyp of colon    History of closed head injury 2005   per pt residual resolved   History of external beam radiation therapy    completed 10/ 2015 for prostate cancer   History of gastric ulcer 2014   History of kidney stones    History of pneumothorax    spontaneous pneumo treated w/ chest tube   History of rib fracture 07/2017   from fall,  right side 6th,7th,8th   Neuropathy    OAB (overactive bladder)    OSA (obstructive sleep apnea)    Intolerant of CPAP   Prostate cancer ([place])    dx 09-26-2011 via bx-- Stage T3b,N0,  Gleason 4+4, PSA 11.2 with METs to external iliac lymph node(resolved with ADT)-- treated w/ ADT ;   05/2013 staging work-up for rising PSA , started casodex and completed radiation therapy 10/ 2015;   rising PSA post treatment   RLS (restless legs syndrome)    Ureteral neocystostomy bleed    left side   Urge urinary incontinence    Past Surgical History:  Procedure Laterality Date   ABDOMINAL AORTIC ENDOVASCULAR STENT GRAFT  09-13-2007    dr [name]  [facility]   closure penetrating atherosclerotic ulcer of infarenal aorta with stent graft   BIOPSY  03/06/2019   Procedure: BIOPSY;  Surgeon: [name], MD;  [facility];  Service: Endoscopy;;  gastric   CARDIAC CATHETERIZATION  2005   per pt normal (done in [place])   [place]  10/06/2009    dr cooper   minimal nonobstructive CAD w/30% LAD otherwise normal coronaries, LVEDP 77mmHg   CARDIOVASCULAR STRESS TEST  09/27/2009    dr Aundra Dubin   lexiscan nuclear study w/ moderate reversible inferior perfusion defect ischemia,  ef 60% (cardiac cath scheduled)   CATARACT EXTRACTION W/PHACO Right 07/12/2020   Procedure: CATARACT EXTRACTION PHACO AND INTRAOCULAR LENS PLACEMENT RIGHT EYE;  Surgeon: [name], MD;  Location: [facility];  Service: Ophthalmology;  Laterality: Right;  CDE=15.48    CHEST TUBE INSERTION  1989   "collapsed lung"due to injury   CIRCUMCISION  09/26/2011   Procedure: CIRCUMCISION ADULT;  Surgeon: [name], MD;  Location: [facility];  Service: Urology;  Laterality: N/A;   COLONOSCOPY W/ POLYPECTOMY     COLONOSCOPY WITH PROPOFOL N/A 04/27/2016    eight 4-8 mm polyps in descending colon, at splenic flexure, in ascending colon and cecum. Tubular adenomas. Surveillance in 3 years.   CYSTOSCOPY  09/26/2011   Procedure: CYSTOSCOPY;  Surgeon: [name], MD;  Location: [facility];  Service: Urology;  Laterality: N/A;   CYSTOSCOPY/RETROGRADE/URETEROSCOPY Left 09/27/2017   Procedure: CYSTOSCOPYLEFT /RETROGRADE/URETEROSCOPY AND RENAL WASHINGS;  Surgeon: [name], MD;  Location[facility];  Service: Urology;  Laterality: Left;   ESOPHAGOGASTRODUODENOSCOPY (EGD) WITH PROPOFOL N/A 04/27/2016   normal esophagus s/p dilation, small hiatal hernia, normal duodenum   ESOPHAGOGASTRODUODENOSCOPY (EGD) WITH PROPOFOL N/A 03/06/2019   erosive reflux esophagitis, s/p dilation, normal duodenum, abnormal gastric mucosa s/p biopsy. Hyperplastic gastric polyp. No H.pylori.    EXTRACORPOREAL SHOCK WAVE LITHOTRIPSY  yrs ago   FRACTURE SURGERY  child   Bilateral lower arms    KNEE ARTHROSCOPY Right 2013   MALONEY DILATION N/A 04/27/2016   Procedure: Venia Minks DILATION;  Surgeon: [name], MD;  [facility];  Service: Endoscopy;  Laterality: N/A;   MALONEY DILATION N/A 03/06/2019   Procedure: Venia Minks DILATION;  Surgeon: [name], MD;  [facility];  Service: Endoscopy;  Laterality: N/A;   PARTIAL KNEE ARTHROPLASTY Right 04/26/2015   Procedure: RIGHT KNEE MEDIAL UNICOMPARTMENTAL ARTHROPLASTY;  Surgeon: Gaynelle Arabian, MD;  [facility];  Service: Orthopedics;  Laterality: Right;   POLYPECTOMY  04/27/2016   Procedure: POLYPECTOMY;  Surgeon: [name], MD;  [facility];  Service: Endoscopy;;  cecal , ascending, descending, and  sigmoid polypectomies   PROSTATE BIOPSY  09/26/2011   Procedure: BIOPSY TRANSRECTAL ULTRASONIC PROSTATE (TUBP);  Surgeon: [name], MD;  Location[facility];  Service: Urology;  Laterality: N/A;      TRANSTHORACIC ECHOCARDIOGRAM  01-07-2013   dr [name]   ef 60-65%,  grade 1 diastolic dysfunction/  mild AR without stenosis/  mild LAE/  mild to moderate calcificed MV annulus without regurg. or stenosis/   UMBILICAL HERNIA REPAIR  yrs ago   Social History:  reports that he has been smoking cigarettes. He has a 21.00 pack-year smoking history. He has never used smokeless tobacco. He reports that he does not drink alcohol and does not use drugs.  Allergies  Allergen Reactions   Carbidopa-Levodopa Other (See Comments)    hallunications   Morphine Sulfate Nausea And Vomiting   Sulfa Antibiotics Nausea And Vomiting   Sulfacetamide Sodium Nausea And Vomiting   Family History  Problem Relation Age of Onset   Stroke Mother    Alzheimer's disease Mother 33       probably due to head injury   Mesothelioma Father 29   Kidney cancer Father    Hyperlipidemia Sister    Heart attack Sister    Kidney cancer Sister        dx in her 47s   Heart disease Brother    Kidney cancer Brother 96       dx in his 70s   Hyperlipidemia Brother    Kidney cancer Brother        dx in his 60s   Healthy Daughter    Healthy Son    Healthy Son    Colon cancer Neg Hx    Family history: Family history reviewed and not pertinent  Prior to Admission medications   Medication Sig Start Date End Date Taking? Authorizing Provider  abiraterone acetate (ZYTIGA) 250 MG tablet Take 2 tablets (500 mg total) by mouth daily. Take on an empty stomach 1 hour before or 2 hours after a meal 10/27/20   Heath Lark, MD  acetaminophen (TYLENOL) 325 MG tablet Take 2 tablets (650 mg total) by mouth every 6 (six) hours as needed for mild pain (or Fever >/= 101). 02/25/21   Nita Sells, MD  albuterol (VENTOLIN HFA) 108 (90  Base) MCG/ACT inhaler Inhale 1-2 puffs into the lungs every 6 (six) hours as needed for wheezing or shortness of breath. 02/06/21   Tonye Pearson, PA-C  carvedilol (COREG) 25 MG tablet Take 0.5 tablets (12.5 mg total) by mouth 2 (two) times daily. 02/25/21   Nita Sells, MD  cloNIDine (CATAPRES) 0.1 MG tablet Take 1 tablet (0.1 mg total) by mouth 2 (two) times daily. Take for blood pressure > 180, and make sure u lay flat and get up very slowl if u take this--this can drop ur pressure and cause u to feel dizzy 02/25/21 02/25/22  Nita Sells, MD  DULoxetine (CYMBALTA) 60 MG capsule Take 1 capsule (60 mg total) by mouth daily. Take 1 capsule by mouth once daily (Needs to be seen before next refill) 05/19/20   [name], [name], MD  levETIRAcetam (KEPPRA) 500 MG tablet Take 1 tablet (500 mg total) by mouth 2 (two) times daily. 02/25/21   Nita Sells, MD  pantoprazole (PROTONIX) 40 MG tablet TAKE 1 TABLET BY MOUTH TWICE DAILY BEFORE A MEAL 01/12/21   Gwenlyn Perking, FNP  rosuvastatin (CRESTOR) 20 MG tablet TAKE 1 TABLET BY MOUTH ONCE DAILY -  STOP  PRAVASTATIN -- NEEDS FOLLOW UP APPOINTMENT FOR REFILLS Patient taking differently: Take 20 mg by mouth See admin instructions. TAKE 1 TABLET BY MOUTH ONCE DAILY -  STOP  PRAVASTATIN -- NEEDS FOLLOW UP APPOINTMENT FOR REFILLS 09/30/20   Satira Sark, MD  tamsulosin (FLOMAX) 0.4 MG CAPS capsule Take 1 capsule (0.4 mg total) by mouth daily. 04/29/20   [name], MD   Physical Exam: Vitals:   03/02/21 1442 03/02/21 1443 03/02/21 2037 03/02/21 2039  BP: 124/72  (!) 169/99   Pulse: 66  64 62  Resp: 18  18 14   Temp: 97.6 F (36.4 C)     TempSrc: Oral     SpO2: 100%  100% 100%  Weight:  108.9 kg    Height:  5\' 8"  (1.727 m)     Constitutional: appears age-appropriate, NAD, calm, comfortable Eyes: PERRL, lids and conjunctivae normal ENMT: Mucous membranes are moist. Posterior pharynx clear of  any exudate or lesions.  Age-appropriate dentition. Hearing appropriate Neck: normal, supple, no masses, no thyromegaly Respiratory: clear to auscultation bilaterally, no wheezing, no crackles. Normal respiratory effort. No accessory muscle use.  Cardiovascular: Regular rate and rhythm, no murmurs / rubs / gallops. No extremity edema. 2+ pedal pulses. No carotid bruits.  Abdomen: no tenderness, no masses palpated, no hepatosplenomegaly. Bowel sounds positive.  Musculoskeletal: no clubbing / cyanosis. No joint deformity upper and lower extremities. Good ROM, no contractures, no atrophy. Normal muscle tone.  Skin: no rashes, lesions, ulcers. No induration Neurologic: Sensation intact. Strength 5/5 in all 4.  Psychiatric: Normal judgment and insight. Alert and oriented x 3. Normal mood.   EKG: independently reviewed, showing sinus rhythm with rate of 64, first-degree AV block, QTC of 486  Chest x-ray on Admission: I personally reviewed and I agree with radiologist reading as below.  CT HEAD WO CONTRAST  Result Date: 03/02/2021 CLINICAL DATA:  Follow-up hemorrhage EXAM: CT HEAD WITHOUT CONTRAST TECHNIQUE: Contiguous axial images were obtained from the base of the skull through the vertex without intravenous contrast. COMPARISON:  Head CT dated February 28, 2021 FINDINGS: Brain: Subarachnoid and intraparenchymal hemorrhage again seen over the left frontal lobe, right falx, and inferior temporal lobes, slightly decreased density when compared with prior exam. No new hemorrhage. No mass effect or midline shift. Chronic white matter ischemic change. Vascular: No hyperdense vessel or unexpected calcification. Skull: Normal. Negative for fracture or focal lesion. Sinuses/Orbits: Mild mucosal thickening of the ethmoid sinuses. No acute finding. Other: None. IMPRESSION: Evolving subarachnoid and intraparenchymal hemorrhage. No new hemorrhage. No mass effect or midline shift. Electronically Signed   By: Yetta Glassman M.D.   On:  03/02/2021 16:21   CT HEAD WO CONTRAST (5MM)  Result Date: 03/01/2021 CLINICAL DATA:  Trauma. EXAM: CT HEAD WITHOUT CONTRAST CT MAXILLOFACIAL WITHOUT CONTRAST CT CERVICAL SPINE WITHOUT CONTRAST TECHNIQUE: Multidetector CT imaging of the head, cervical spine, and maxillofacial structures were performed using the standard protocol without intravenous contrast. Multiplanar CT image reconstructions of the cervical spine and maxillofacial structures were also generated. COMPARISON:  Head CT dated 02/24/2021. FINDINGS: CT HEAD FINDINGS Brain: Mild age-related atrophy and chronic microvascular ischemic changes. Residual subarachnoid and intraparenchymal hemorrhage over the left frontal lobe and anterior right falx as well as areas of hemorrhage involving the inferior tip of the temporal lobes appears similar to prior CT or slightly decreased in attenuation. Tiny layering blood noted in the occipital horn of the left lateral ventricle. No new hemorrhage. No mass effect or midline shift. Vascular: No hyperdense vessel or unexpected calcification. Skull: Normal. Negative for fracture or focal lesion. Other: None CT MAXILLOFACIAL FINDINGS Osseous: No fracture or mandibular dislocation. No destructive process. Orbits: Negative. No traumatic or inflammatory finding. Sinuses: Clear. Soft tissues: Negative. CT CERVICAL SPINE FINDINGS Alignment: No acute subluxation. Skull base and vertebrae: No acute fracture. Soft tissues and spinal canal: No prevertebral fluid or swelling. No visible canal hematoma. Disc levels:  Degenerative changes at C5-C6. Upper chest: Negative. Other: Bilateral carotid bulb calcified plaques. IMPRESSION: 1. Residual intracranial subarachnoid and intraparenchymal hemorrhages. No new hemorrhage. No mass effect or midline shift. 2. No acute/traumatic cervical spine pathology. 3. No acute facial bone fractures. Electronically Signed   By: Anner Crete M.D.   On: 03/01/2021 00:11   CT CERVICAL SPINE  WO CONTRAST  Result Date: 03/01/2021 CLINICAL DATA:  Trauma. EXAM: CT HEAD WITHOUT CONTRAST CT MAXILLOFACIAL WITHOUT CONTRAST CT CERVICAL SPINE WITHOUT CONTRAST TECHNIQUE: Multidetector CT imaging  of the head, cervical spine, and maxillofacial structures were performed using the standard protocol without intravenous contrast. Multiplanar CT image reconstructions of the cervical spine and maxillofacial structures were also generated. COMPARISON:  Head CT dated 02/24/2021. FINDINGS: CT HEAD FINDINGS Brain: Mild age-related atrophy and chronic microvascular ischemic changes. Residual subarachnoid and intraparenchymal hemorrhage over the left frontal lobe and anterior right falx as well as areas of hemorrhage involving the inferior tip of the temporal lobes appears similar to prior CT or slightly decreased in attenuation. Tiny layering blood noted in the occipital horn of the left lateral ventricle. No new hemorrhage. No mass effect or midline shift. Vascular: No hyperdense vessel or unexpected calcification. Skull: Normal. Negative for fracture or focal lesion. Other: None CT MAXILLOFACIAL FINDINGS Osseous: No fracture or mandibular dislocation. No destructive process. Orbits: Negative. No traumatic or inflammatory finding. Sinuses: Clear. Soft tissues: Negative. CT CERVICAL SPINE FINDINGS Alignment: No acute subluxation. Skull base and vertebrae: No acute fracture. Soft tissues and spinal canal: No prevertebral fluid or swelling. No visible canal hematoma. Disc levels:  Degenerative changes at C5-C6. Upper chest: Negative. Other: Bilateral carotid bulb calcified plaques. IMPRESSION: 1. Residual intracranial subarachnoid and intraparenchymal hemorrhages. No new hemorrhage. No mass effect or midline shift. 2. No acute/traumatic cervical spine pathology. 3. No acute facial bone fractures. Electronically Signed   By: Anner Crete M.D.   On: 03/01/2021 00:11   CT MAXILLOFACIAL WO CONTRAST  Result Date:  03/01/2021 CLINICAL DATA:  Trauma. EXAM: CT HEAD WITHOUT CONTRAST CT MAXILLOFACIAL WITHOUT CONTRAST CT CERVICAL SPINE WITHOUT CONTRAST TECHNIQUE: Multidetector CT imaging of the head, cervical spine, and maxillofacial structures were performed using the standard protocol without intravenous contrast. Multiplanar CT image reconstructions of the cervical spine and maxillofacial structures were also generated. COMPARISON:  Head CT dated 02/24/2021. FINDINGS: CT HEAD FINDINGS Brain: Mild age-related atrophy and chronic microvascular ischemic changes. Residual subarachnoid and intraparenchymal hemorrhage over the left frontal lobe and anterior right falx as well as areas of hemorrhage involving the inferior tip of the temporal lobes appears similar to prior CT or slightly decreased in attenuation. Tiny layering blood noted in the occipital horn of the left lateral ventricle. No new hemorrhage. No mass effect or midline shift. Vascular: No hyperdense vessel or unexpected calcification. Skull: Normal. Negative for fracture or focal lesion. Other: None CT MAXILLOFACIAL FINDINGS Osseous: No fracture or mandibular dislocation. No destructive process. Orbits: Negative. No traumatic or inflammatory finding. Sinuses: Clear. Soft tissues: Negative. CT CERVICAL SPINE FINDINGS Alignment: No acute subluxation. Skull base and vertebrae: No acute fracture. Soft tissues and spinal canal: No prevertebral fluid or swelling. No visible canal hematoma. Disc levels:  Degenerative changes at C5-C6. Upper chest: Negative. Other: Bilateral carotid bulb calcified plaques. IMPRESSION: 1. Residual intracranial subarachnoid and intraparenchymal hemorrhages. No new hemorrhage. No mass effect or midline shift. 2. No acute/traumatic cervical spine pathology. 3. No acute facial bone fractures. Electronically Signed   By: Anner Crete M.D.   On: 03/01/2021 00:11    Labs on Admission: I have personally reviewed following labs  CBC: Recent Labs   Lab 03/02/21 1445  WBC 6.1  HGB 10.9*  HCT 31.7*  MCV 88.1  PLT 237   Basic Metabolic Panel: Recent Labs  Lab 02/25/21 0324 03/02/21 1445  NA 138 135  K 3.1* 2.6*  CL 111 104  CO2 21* 24  GLUCOSE 90 112*  BUN 21 24*  CREATININE 2.36* 2.59*  CALCIUM 7.7* 8.2*  MG  --  2.2  PHOS  3.0  --    GFR: Estimated Creatinine Clearance: 29 mL/min (A) (by C-G formula based on SCr of 2.59 mg/dL (H)).  Liver Function Tests: Recent Labs  Lab 02/25/21 0324  ALBUMIN 2.7*   Urine analysis:    Component Value Date/Time   COLORURINE YELLOW 02/15/2021 2223   APPEARANCEUR HAZY (A) 02/15/2021 2223   APPEARANCEUR Clear 11/04/2020 1327   LABSPEC 1.016 02/15/2021 2223   PHURINE 6.0 02/15/2021 2223   GLUCOSEU NEGATIVE 02/15/2021 2223   HGBUR LARGE (A) 02/15/2021 2223   HGBUR negative 02/20/2008 0900   BILIRUBINUR NEGATIVE 02/15/2021 2223   BILIRUBINUR Negative 11/04/2020 [place] 02/15/2021 2223   PROTEINUR >=300 (A) 02/15/2021 2223   UROBILINOGEN negative 07/29/2012 1449   UROBILINOGEN 0.2 10/21/2011 2025   NITRITE NEGATIVE 02/15/2021 2223   LEUKOCYTESUR NEGATIVE 02/15/2021 2223   Dr. Tobie Poet [facility]  If 7PM-7AM, please contact overnight-coverage provider If 7AM-7PM, please contact day coverage provider [email or web address]  03/02/2021, 11:13 PM

## 2021-03-02 NOTE — ED Triage Notes (Addendum)
Pt comes into the ED via EMS from home with c/o multiple falls over the past 10 days  has been seen at [facility] and [place] , has not been able to get out of bed for the past 3 days, having generalized weakness and dizziness Review chart pt dx with covid in the past month and has been having issues since 166/93 HR67 RR20 97%RA CBG104 98 temp

## 2021-03-02 NOTE — ED Provider Notes (Signed)
[facility] Emergency Department Provider Note   ____________________________________________   Event Date/Time   First MD Initiated Contact with Patient 03/02/21 2037     (approximate)  I have reviewed the triage vital signs and the nursing notes.   HISTORY  Chief Complaint Dizziness, Fall, and Weakness    HPI [name] is a 75 y.o. male history of recent intracranial hemorrhage, chronic kidney disease, COVID at the beginning of November  Patient reports has been at home for the last 3 weeks essentially he has been unable to eat very little fluid intake feeling extremely weak and fatigued and is getting worse day over day  He is to the point now when he gets up or tries to move about he will get lightheaded and reports he really cannot get up move about or walk.  He is basically just been laying in bed  Feels extremely fatigued.  Denies any new falls or head injuries today though.  No chest pain no trouble breathing  Reports he just feels like he never regained his appetite after he had a severe episode of COVID early November has been going downhill since  Past Medical History:  Diagnosis Date   Aortic atherosclerosis ([place])    2004  s/p  closure Penetrating atherosclerotic ulcer of infarenal aorta w/ endovascular stent graft   Arthritis    CKD (chronic kidney disease), stage III ([place])    Closed fracture of head of humerus 07/2017   right shoulder from fall; 09-25-2017 per pt no surgical intervention, only wore sling, intermittant pain   Coronary atherosclerosis of native coronary artery    positive myoview for ischemia 09-27-2009;  10-01-2009 per cardiac cath-- minimal nonobstructive CAD w/ 30% LAD    ED (erectile dysfunction)    Emphysema/COPD ([place])    followed by pcp--- last exacerbation 09-12-2017;  09-25-2017 per pt no cough, sob or congestion   Essential hypertension    Family history of kidney cancer    First degree heart block     Frequency of urination    GERD (gastroesophageal reflux disease)    Gout    09-25-2017  per pt stable,  last episode 2015   Heart murmur    History of adenomatous polyp of colon    History of closed head injury 2005   per pt residual resolved   History of external beam radiation therapy    completed 10/ 2015 for prostate cancer   History of gastric ulcer 2014   History of kidney stones    History of pneumothorax    spontaneous pneumo treated w/ chest tube   History of rib fracture 07/2017   from fall,  right side 6th,7th,8th   Neuropathy    OAB (overactive bladder)    OSA (obstructive sleep apnea)    Intolerant of CPAP   Prostate cancer ([place])    dx 09-26-2011 via bx-- Stage T3b,N0,  Gleason 4+4, PSA 11.2 with METs to external iliac lymph node(resolved with ADT)-- treated w/ ADT ;   05/2013 staging work-up for rising PSA , started casodex and completed radiation therapy 10/ 2015;   rising PSA post treatment   RLS (restless legs syndrome)    Ureteral neocystostomy bleed    left side   Urge urinary incontinence     Patient Active Problem List   Diagnosis Date Noted   Hypokalemia due to excessive gastrointestinal loss of potassium 03/02/2021   SAH (subarachnoid hemorrhage) ([place]) 02/24/2021   Subarachnoid bleed ([place]) 02/23/2021  Acute metabolic encephalopathy 02/58/5277   Incisional hernia, without obstruction or gangrene 82/42/3536   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 01/19/2020   Family history of kidney cancer    Chronic kidney disease (CKD), stage IV (severe) ([place]) 07/10/2018   Depression, recurrent ([place]) 04/08/2018   Neuropathy 03/17/2015   Prostate cancer ([place]) 06/30/2013   Abdominal aneurysm without mention of rupture 02/19/2013   Morbid obesity ([place]) 01/14/2013   VITAMIN B12 DEFICIENCY 01/14/2013   TOBACCO ABUSE 01/14/2013   PTSD 01/14/2013   COPD (chronic obstructive pulmonary disease) (H[facility])    OSA (obstructive sleep apnea) 08/14/2012   RLS (restless legs  syndrome) 08/14/2012   Prediabetes 08/14/2012   Aortic atherosclerosis ([place]) 02/19/2012   Coronary atherosclerosis of native coronary artery 03/23/2010   Hypokalemia 11/25/2009   GERD 07/30/2008   History of colonic polyps 07/30/2008   Benign prostatic hyperplasia with lower urinary tract symptoms 02/20/2008   Hyperlipidemia 01/31/2007   GOUT 01/31/2007   Essential hypertension, benign 01/31/2007    Past Surgical History:  Procedure Laterality Date   ABDOMINAL AORTIC ENDOVASCULAR STENT GRAFT  09-13-2007    dr [name]  [facility]   closure penetrating atherosclerotic ulcer of infarenal aorta with stent graft   BIOPSY  03/06/2019   Procedure: BIOPSY;  Surgeon: [name], MD;  [facility];  Service: Endoscopy;;  gastric   CARDIAC CATHETERIZATION  2005   per pt normal (done in [place])   [place]  10/06/2009    dr cooper   minimal nonobstructive CAD w/30% LAD otherwise normal coronaries, LVEDP 34mmHg   CARDIOVASCULAR STRESS TEST  09/27/2009    dr Aundra Dubin   lexiscan nuclear study w/ moderate reversible inferior perfusion defect ischemia,  ef 60% (cardiac cath scheduled)   CATARACT EXTRACTION W/PHACO Right 07/12/2020   Procedure: CATARACT EXTRACTION PHACO AND INTRAOCULAR LENS PLACEMENT RIGHT EYE;  Surgeon: [name], MD;  Location: [facility];  Service: Ophthalmology;  Laterality: Right;  CDE=15.48   CHEST TUBE INSERTION  1989   "collapsed lung"due to injury   CIRCUMCISION  09/26/2011   Procedure: CIRCUMCISION ADULT;  Surgeon: [name], MD;  Location: [facility];  Service: Urology;  Laterality: N/A;   COLONOSCOPY W/ POLYPECTOMY     COLONOSCOPY WITH PROPOFOL N/A 04/27/2016    eight 4-8 mm polyps in descending colon, at splenic flexure, in ascending colon and cecum. Tubular adenomas. Surveillance in 3 years.   CYSTOSCOPY  09/26/2011   Procedure: CYSTOSCOPY;  Surgeon: [name], MD;  Location: [facility];  Service: Urology;  Laterality: N/A;    CYSTOSCOPY/RETROGRADE/URETEROSCOPY Left 09/27/2017   Procedure: CYSTOSCOPYLEFT /RETROGRADE/URETEROSCOPY AND RENAL WASHINGS;  Surgeon: [name], MD;  Location: [facility];  Service: Urology;  Laterality: Left;   ESOPHAGOGASTRODUODENOSCOPY (EGD) WITH PROPOFOL N/A 04/27/2016   normal esophagus s/p dilation, small hiatal hernia, normal duodenum   ESOPHAGOGASTRODUODENOSCOPY (EGD) WITH PROPOFOL N/A 03/06/2019   erosive reflux esophagitis, s/p dilation, normal duodenum, abnormal gastric mucosa s/p biopsy. Hyperplastic gastric polyp. No H.pylori.    EXTRACORPOREAL SHOCK WAVE LITHOTRIPSY  yrs ago   FRACTURE SURGERY  child   Bilateral lower arms    KNEE ARTHROSCOPY Right 2013   MALONEY DILATION N/A 04/27/2016   Procedure: Venia Minks DILATION;  Surgeon: [name], MD;  [facility];  Service: Endoscopy;  Laterality: N/A;   MALONEY DILATION N/A 03/06/2019   Procedure: Venia Minks DILATION;  Surgeon: [name], MD;  [facility];  Service: Endoscopy;  Laterality: N/A;  PARTIAL KNEE ARTHROPLASTY Right 04/26/2015   Procedure: RIGHT KNEE MEDIAL UNICOMPARTMENTAL ARTHROPLASTY;  Surgeon: Gaynelle Arabian, MD;  [facility];  Service: Orthopedics;  Laterality: Right;   POLYPECTOMY  04/27/2016   Procedure: POLYPECTOMY;  Surgeon: [name], MD;  [facility];  Service: Endoscopy;;  cecal , ascending, descending, and sigmoid polypectomies   PROSTATE BIOPSY  09/26/2011   Procedure: BIOPSY TRANSRECTAL ULTRASONIC PROSTATE (TUBP);  Surgeon: [name], MD;  [facility];  Service: Urology;  Laterality: N/A;      TRANSTHORACIC ECHOCARDIOGRAM  01-07-2013   dr [name]   ef 60-65%,  grade 1 diastolic dysfunction/  mild AR without stenosis/  mild LAE/  mild to moderate calcificed MV annulus without regurg. or stenosis/   UMBILICAL HERNIA REPAIR  yrs ago    Prior to Admission medications   Medication Sig Start Date End Date Taking? Authorizing Provider   abiraterone acetate (ZYTIGA) 250 MG tablet Take 2 tablets (500 mg total) by mouth daily. Take on an empty stomach 1 hour before or 2 hours after a meal 10/27/20   Heath Lark, MD  acetaminophen (TYLENOL) 325 MG tablet Take 2 tablets (650 mg total) by mouth every 6 (six) hours as needed for mild pain (or Fever >/= 101). 02/25/21   Nita Sells, MD  albuterol (VENTOLIN HFA) 108 (90 Base) MCG/ACT inhaler Inhale 1-2 puffs into the lungs every 6 (six) hours as needed for wheezing or shortness of breath. 02/06/21   Tonye Pearson, PA-C  carvedilol (COREG) 25 MG tablet Take 0.5 tablets (12.5 mg total) by mouth 2 (two) times daily. 02/25/21   Nita Sells, MD  cloNIDine (CATAPRES) 0.1 MG tablet Take 1 tablet (0.1 mg total) by mouth 2 (two) times daily. Take for blood pressure > 180, and make sure u lay flat and get up very slowl if u take this--this can drop ur pressure and cause u to feel dizzy 02/25/21 02/25/22  Nita Sells, MD  DULoxetine (CYMBALTA) 60 MG capsule Take 1 capsule (60 mg total) by mouth daily. Take 1 capsule by mouth once daily (Needs to be seen before next refill) 05/19/20   [name], [name], MD  levETIRAcetam (KEPPRA) 500 MG tablet Take 1 tablet (500 mg total) by mouth 2 (two) times daily. 02/25/21   Nita Sells, MD  pantoprazole (PROTONIX) 40 MG tablet TAKE 1 TABLET BY MOUTH TWICE DAILY BEFORE A MEAL 01/12/21   Gwenlyn Perking, FNP  rosuvastatin (CRESTOR) 20 MG tablet TAKE 1 TABLET BY MOUTH ONCE DAILY -  STOP  PRAVASTATIN -- NEEDS FOLLOW UP APPOINTMENT FOR REFILLS Patient taking differently: Take 20 mg by mouth See admin instructions. TAKE 1 TABLET BY MOUTH ONCE DAILY -  STOP  PRAVASTATIN -- NEEDS FOLLOW UP APPOINTMENT FOR REFILLS 09/30/20   Satira Sark, MD  tamsulosin (FLOMAX) 0.4 MG CAPS capsule Take 1 capsule (0.4 mg total) by mouth daily. 04/29/20   [name], MD    Allergies Carbidopa-levodopa, Morphine sulfate, Sulfa antibiotics, and  Sulfacetamide sodium  Family History  Problem Relation Age of Onset   Stroke Mother    Alzheimer's disease Mother 70       probably due to head injury   Mesothelioma Father 28   Kidney cancer Father    Hyperlipidemia Sister    Heart attack Sister    Kidney cancer Sister        dx in her 38s   Heart disease Brother    Kidney cancer Brother 38  dx in his 11s   Hyperlipidemia Brother    Kidney cancer Brother        dx in his 36s   Healthy Daughter    Healthy Son    Healthy Son    Colon cancer Neg Hx     Social History Social History   Tobacco Use   Smoking status: Every Day    Packs/day: 0.50    Years: 42.00    Pack years: 21.00    Types: Cigarettes   Smokeless tobacco: Never  Vaping Use   Vaping Use: Never used  Substance Use Topics   Alcohol use: No    Alcohol/week: 0.0 standard drinks   Drug use: No    Review of Systems Constitutional: No fever/chills Eyes: No visual changes. ENT: No sore throat. Cardiovascular: Denies chest pain. Respiratory: Denies shortness of breath. Gastrointestinal: No abdominal pain.  No appetite.  Denies diarrhea no vomiting Genitourinary: Negative for dysuria. Musculoskeletal: Negative for back pain. Skin: Negative for rash. Neurological: Negative for headaches, areas of focal weakness or numbness.  Gets lightheaded when he tries to get up    ____________________________________________   PHYSICAL EXAM:  VITAL SIGNS: ED Triage Vitals  Enc Vitals Group     BP 03/02/21 1442 124/72     Pulse Rate 03/02/21 1442 66     Resp 03/02/21 1442 18     Temp 03/02/21 1442 97.6 F (36.4 C)     Temp Source 03/02/21 1442 Oral     SpO2 03/02/21 1442 100 %     Weight 03/02/21 1443 240 lb (108.9 kg)     Height 03/02/21 1443 5\' 8"  (1.727 m)     Head Circumference --      Peak Flow --      Pain Score 03/02/21 1443 1     Pain Loc --      Pain Edu? --      Excl. in [place]? --     Constitutional: Alert and oriented.  Chronically  ill-appearing but in no acute distress Eyes: Conjunctivae are normal. Head: Atraumatic. Nose: No congestion/rhinnorhea. Mouth/Throat: Mucous membranes are dry. Neck: No stridor.  Cardiovascular: Normal rate, regular rhythm. Grossly normal heart sounds.  Good peripheral circulation. Respiratory: Normal respiratory effort.  No retractions. Lungs CTAB. Gastrointestinal: Soft and nontender. No distention. Musculoskeletal: No lower extremity tenderness nor edema.  Demonstrates some muscular weakness in all 4 extremities approximately 4-1/2 out of 5 strength.  No focal deficits Neurologic:  Normal speech and language. No gross focal neurologic deficits are appreciated.  Skin:  Skin is warm, dry and intact. No rash noted. Psychiatric: Mood and affect are normal. Speech and behavior are normal.  ____________________________________________   LABS (all labs ordered are listed, but only abnormal results are displayed)  Labs Reviewed  BASIC METABOLIC PANEL - Abnormal; Notable for the following components:      Result Value   Potassium 2.6 (*)    Glucose, Bld 112 (*)    BUN 24 (*)    Creatinine, Ser 2.59 (*)    Calcium 8.2 (*)    GFR, Estimated 25 (*)    All other components within normal limits  CBC - Abnormal; Notable for the following components:   RBC 3.60 (*)    Hemoglobin 10.9 (*)    HCT 31.7 (*)    All other components within normal limits  TROPONIN I (HIGH SENSITIVITY) - Abnormal; Notable for the following components:   Troponin I (High Sensitivity) 23 (*)  All other components within normal limits  RESP PANEL BY RT-PCR (FLU A&B, COVID) ARPGX2  URINALYSIS, ROUTINE W REFLEX MICROSCOPIC  TSH  MAGNESIUM  BASIC METABOLIC PANEL  CBC  POTASSIUM  VITAMIN B12  PROCALCITONIN  VITAMIN D 25 HYDROXY (VIT D DEFICIENCY, FRACTURES)  CBG MONITORING, ED  TROPONIN I (HIGH SENSITIVITY)  TROPONIN I (HIGH SENSITIVITY)   ____________________________________________  EKG  Reviewed inter  by me at 1440 Heart rate 65 Cures 110 QTc 480 Normal sinus rhythm, T wave abnormality with ST segment inversions V4 V5 V6.  Of note however the patient denies having any associated chest pain or dyspnea ____________________________________________  RADIOLOGY  CT HEAD WO CONTRAST  Result Date: 03/02/2021 CLINICAL DATA:  Follow-up hemorrhage EXAM: CT HEAD WITHOUT CONTRAST TECHNIQUE: Contiguous axial images were obtained from the base of the skull through the vertex without intravenous contrast. COMPARISON:  Head CT dated February 28, 2021 FINDINGS: Brain: Subarachnoid and intraparenchymal hemorrhage again seen over the left frontal lobe, right falx, and inferior temporal lobes, slightly decreased density when compared with prior exam. No new hemorrhage. No mass effect or midline shift. Chronic white matter ischemic change. Vascular: No hyperdense vessel or unexpected calcification. Skull: Normal. Negative for fracture or focal lesion. Sinuses/Orbits: Mild mucosal thickening of the ethmoid sinuses. No acute finding. Other: None. IMPRESSION: Evolving subarachnoid and intraparenchymal hemorrhage. No new hemorrhage. No mass effect or midline shift. Electronically Signed   By: Yetta Glassman M.D.   On: 03/02/2021 16:21     CT head reviewed notable for subarachnoid and intraparenchymal hemorrhage.  However no new hemorrhage noted. ____________________________________________   PROCEDURES  Procedure(s) performed: None  Procedures  Critical Care performed: Yes, see critical care note(s)  CRITICAL CARE Performed by: Delman Kitten   Total critical care time: 25 minutes  Critical care time was exclusive of separately billable procedures and treating other patients.  Critical care was necessary to treat or prevent imminent or life-threatening deterioration.  Critical care was time spent personally by me on the following activities: development of treatment plan with patient and/or surrogate as  well as nursing, discussions with consultants, evaluation of patient's response to treatment, examination of patient, obtaining history from patient or surrogate, ordering and performing treatments and interventions, ordering and review of laboratory studies, ordering and review of radiographic studies, pulse oximetry and re-evaluation of patient's condition.  Severe hypokalemia requiring IV repletion and further admission with associated EKG abnormalities ____________________________________________   INITIAL IMPRESSION / ASSESSMENT AND PLAN / ED COURSE  Pertinent labs & imaging results that were available during my care of the patient were reviewed by me and considered in my medical decision making (see chart for details).   Patient presents with increasing weakness fatigue slight elevation from baseline creatinine.  Appears dehydrated by clinical exam and history.  His potassium is notably low likely secondary to poor nutrition poor oral intake.  We will begin repletion of this he also has EKG changes with T wave abnormalities but denies chest pain.  Very minimally elevated troponin which I doubt based on clinical exam and history is related to acute ACS.  Discussed with the patient, given his frequent falls worsening condition fatigue severe hypokalemia will be admitted to the hospital for further care and management.  ----------------------------------------- 9:54 PM on 03/02/2021 ----------------------------------------- Admission discussed with Dr. Tobie Poet      ____________________________________________   FINAL CLINICAL IMPRESSION(S) / ED DIAGNOSES  Final diagnoses:  Hypokalemia  Dehydration  Abnormal EKG  Note:  This document was prepared using Systems analyst and may include unintentional dictation errors       Delman Kitten, MD 03/02/21 2155

## 2021-03-02 NOTE — Telephone Encounter (Signed)
At home rehab has to be ordered during the visit so he can have this discussion during the visit tomorrow.  Also if he wants to talk about placement in a facility that is also a discussion that needs to happen during a visit.

## 2021-03-02 NOTE — Telephone Encounter (Signed)
Left Kristi with [facility] a message of Dr. Merita Norton recommendations. Asked to call back with any concerns.

## 2021-03-02 NOTE — Telephone Encounter (Signed)
Kristi called back from [facility] stating that EMS is on their way to get pt right now because he cant stand, he feels terrible, has SOB, and his BP was 178/110. Says pt really needs Rehab if Dr Warrick Parisian can send order for him. Says pt told her that he will go anyway except [facility] because pts wife just got out of Rehab there and did not like it.  Pt is scheduled to see Monia Pouch tomorrow for hospital follow up but Steffanie Dunn says she doesn't know how that will be possible because pt literally cannot even stand and he doesn't have any help except his wife and she is in bad shape too.  Please advise and call Kristi. [phone]

## 2021-03-03 ENCOUNTER — Inpatient Hospital Stay: Payer: Medicare Other | Admitting: Family Medicine

## 2021-03-03 ENCOUNTER — Observation Stay: Payer: Medicare Other

## 2021-03-03 ENCOUNTER — Encounter: Payer: Self-pay | Admitting: Internal Medicine

## 2021-03-03 DIAGNOSIS — U071 COVID-19: Secondary | ICD-10-CM | POA: Diagnosis present

## 2021-03-03 DIAGNOSIS — N184 Chronic kidney disease, stage 4 (severe): Secondary | ICD-10-CM | POA: Diagnosis present

## 2021-03-03 DIAGNOSIS — E860 Dehydration: Secondary | ICD-10-CM | POA: Diagnosis present

## 2021-03-03 DIAGNOSIS — A0839 Other viral enteritis: Secondary | ICD-10-CM | POA: Diagnosis present

## 2021-03-03 DIAGNOSIS — E876 Hypokalemia: Secondary | ICD-10-CM | POA: Diagnosis present

## 2021-03-03 DIAGNOSIS — G9341 Metabolic encephalopathy: Secondary | ICD-10-CM | POA: Diagnosis present

## 2021-03-03 DIAGNOSIS — I672 Cerebral atherosclerosis: Secondary | ICD-10-CM | POA: Diagnosis not present

## 2021-03-03 DIAGNOSIS — E669 Obesity, unspecified: Secondary | ICD-10-CM | POA: Diagnosis present

## 2021-03-03 DIAGNOSIS — I619 Nontraumatic intracerebral hemorrhage, unspecified: Secondary | ICD-10-CM | POA: Diagnosis not present

## 2021-03-03 DIAGNOSIS — Z6835 Body mass index (BMI) 35.0-35.9, adult: Secondary | ICD-10-CM | POA: Diagnosis not present

## 2021-03-03 DIAGNOSIS — A084 Viral intestinal infection, unspecified: Secondary | ICD-10-CM | POA: Diagnosis present

## 2021-03-03 DIAGNOSIS — E785 Hyperlipidemia, unspecified: Secondary | ICD-10-CM | POA: Diagnosis present

## 2021-03-03 DIAGNOSIS — R296 Repeated falls: Secondary | ICD-10-CM | POA: Diagnosis present

## 2021-03-03 DIAGNOSIS — G936 Cerebral edema: Secondary | ICD-10-CM | POA: Diagnosis not present

## 2021-03-03 DIAGNOSIS — S06339A Contusion and laceration of cerebrum, unspecified, with loss of consciousness of unspecified duration, initial encounter: Secondary | ICD-10-CM | POA: Diagnosis not present

## 2021-03-03 DIAGNOSIS — G4733 Obstructive sleep apnea (adult) (pediatric): Secondary | ICD-10-CM | POA: Diagnosis present

## 2021-03-03 DIAGNOSIS — F32A Depression, unspecified: Secondary | ICD-10-CM | POA: Diagnosis present

## 2021-03-03 DIAGNOSIS — B379 Candidiasis, unspecified: Secondary | ICD-10-CM | POA: Diagnosis present

## 2021-03-03 DIAGNOSIS — M879 Osteonecrosis, unspecified: Secondary | ICD-10-CM | POA: Diagnosis present

## 2021-03-03 DIAGNOSIS — G2581 Restless legs syndrome: Secondary | ICD-10-CM | POA: Diagnosis present

## 2021-03-03 DIAGNOSIS — N400 Enlarged prostate without lower urinary tract symptoms: Secondary | ICD-10-CM | POA: Diagnosis present

## 2021-03-03 DIAGNOSIS — Z8616 Personal history of COVID-19: Secondary | ICD-10-CM | POA: Diagnosis not present

## 2021-03-03 DIAGNOSIS — E1122 Type 2 diabetes mellitus with diabetic chronic kidney disease: Secondary | ICD-10-CM | POA: Diagnosis present

## 2021-03-03 DIAGNOSIS — S066X0A Traumatic subarachnoid hemorrhage without loss of consciousness, initial encounter: Secondary | ICD-10-CM | POA: Diagnosis present

## 2021-03-03 DIAGNOSIS — F17210 Nicotine dependence, cigarettes, uncomplicated: Secondary | ICD-10-CM | POA: Diagnosis present

## 2021-03-03 DIAGNOSIS — I129 Hypertensive chronic kidney disease with stage 1 through stage 4 chronic kidney disease, or unspecified chronic kidney disease: Secondary | ICD-10-CM | POA: Diagnosis present

## 2021-03-03 DIAGNOSIS — K219 Gastro-esophageal reflux disease without esophagitis: Secondary | ICD-10-CM | POA: Diagnosis present

## 2021-03-03 DIAGNOSIS — J449 Chronic obstructive pulmonary disease, unspecified: Secondary | ICD-10-CM | POA: Diagnosis present

## 2021-03-03 LAB — BASIC METABOLIC PANEL
Anion gap: 7 (ref 5–15)
BUN: 25 mg/dL — ABNORMAL HIGH (ref 8–23)
CO2: 24 mmol/L (ref 22–32)
Calcium: 8.1 mg/dL — ABNORMAL LOW (ref 8.9–10.3)
Chloride: 106 mmol/L (ref 98–111)
Creatinine, Ser: 2.53 mg/dL — ABNORMAL HIGH (ref 0.61–1.24)
GFR, Estimated: 26 mL/min — ABNORMAL LOW (ref >60)
Glucose, Bld: 93 mg/dL (ref 70–99)
Potassium: 3.6 mmol/L (ref 3.5–5.1)
Sodium: 137 mmol/L (ref 135–145)

## 2021-03-03 LAB — CBC
HCT: 28.5 % — ABNORMAL LOW (ref 39.0–52.0)
Hemoglobin: 9.9 g/dL — ABNORMAL LOW (ref 13.0–17.0)
MCH: 30.6 pg (ref 26.0–34.0)
MCHC: 34.7 g/dL (ref 30.0–36.0)
MCV: 88 fL (ref 80.0–100.0)
Platelets: 160 10*3/uL (ref 150–400)
RBC: 3.24 MIL/uL — ABNORMAL LOW (ref 4.22–5.81)
RDW: 14.6 % (ref 11.5–15.5)
WBC: 5.9 10*3/uL (ref 4.0–10.5)
nRBC: 0 % (ref 0.0–0.2)

## 2021-03-03 LAB — TROPONIN I (HIGH SENSITIVITY): Troponin I (High Sensitivity): 22 ng/L — ABNORMAL HIGH (ref <18)

## 2021-03-03 LAB — HEMOGLOBIN A1C
Hgb A1c MFr Bld: 5.4 % (ref 4.8–5.6)
Mean Plasma Glucose: 108 mg/dL

## 2021-03-03 LAB — CBG MONITORING, ED
Glucose-Capillary: 93 mg/dL (ref 70–99)
Glucose-Capillary: 151 mg/dL — ABNORMAL HIGH (ref 70–99)
Glucose-Capillary: 148 mg/dL — ABNORMAL HIGH (ref 70–99)

## 2021-03-03 LAB — POTASSIUM
Potassium: 2.7 mmol/L — CL (ref 3.5–5.1)
Potassium: 2.6 mmol/L — CL (ref 3.5–5.1)

## 2021-03-03 LAB — VITAMIN B12: Vitamin B-12: 204 pg/mL (ref 180–914)

## 2021-03-03 LAB — VITAMIN D 25 HYDROXY (VIT D DEFICIENCY, FRACTURES): Vit D, 25-Hydroxy: 21.1 ng/mL — ABNORMAL LOW (ref 30–100)

## 2021-03-03 LAB — MAGNESIUM: Magnesium: 2 mg/dL (ref 1.7–2.4)

## 2021-03-03 MED ORDER — POTASSIUM CHLORIDE IN NACL 20-0.9 MEQ/L-% IV SOLN
INTRAVENOUS | Status: DC
  Filled 2021-03-03 (×4): qty 1000

## 2021-03-03 MED ORDER — POTASSIUM CHLORIDE 10 MEQ/100ML IV SOLN
10.0000 meq | INTRAVENOUS | Status: AC
  Administered 2021-03-03 (×4): 10 meq via INTRAVENOUS
  Filled 2021-03-03 (×4): qty 100

## 2021-03-03 MED ORDER — MAGNESIUM SULFATE 2 GM/50ML IV SOLN
2.0000 g | Freq: Once | INTRAVENOUS | Status: AC
  Administered 2021-03-03: 2 g via INTRAVENOUS
  Filled 2021-03-03: qty 50

## 2021-03-03 MED ORDER — ENSURE ENLIVE PO LIQD
237.0000 mL | Freq: Two times a day (BID) | ORAL | Status: DC
  Administered 2021-03-03 – 2021-03-05 (×4): 237 mL via ORAL

## 2021-03-03 MED ORDER — CLONIDINE HCL 0.1 MG PO TABS
0.1000 mg | ORAL_TABLET | Freq: Two times a day (BID) | ORAL | Status: DC | PRN
  Administered 2021-03-03: 0.1 mg via ORAL
  Filled 2021-03-03: qty 1

## 2021-03-03 MED ORDER — TAMSULOSIN HCL 0.4 MG PO CAPS
0.4000 mg | ORAL_CAPSULE | Freq: Every day | ORAL | Status: DC
  Administered 2021-03-03 – 2021-03-04 (×2): 0.4 mg via ORAL
  Filled 2021-03-03 (×2): qty 1

## 2021-03-03 MED ORDER — BENZONATATE 100 MG PO CAPS
200.0000 mg | ORAL_CAPSULE | Freq: Three times a day (TID) | ORAL | Status: DC
  Administered 2021-03-03 – 2021-03-05 (×5): 200 mg via ORAL
  Filled 2021-03-03 (×5): qty 2

## 2021-03-03 MED ORDER — POTASSIUM CHLORIDE CRYS ER 20 MEQ PO TBCR
40.0000 meq | EXTENDED_RELEASE_TABLET | Freq: Once | ORAL | Status: AC
  Administered 2021-03-03: 40 meq via ORAL
  Filled 2021-03-03: qty 2

## 2021-03-03 MED ORDER — POTASSIUM CHLORIDE CRYS ER 20 MEQ PO TBCR: 40.0000 meq | EXTENDED_RELEASE_TABLET | Freq: Every day | ORAL | Status: DC

## 2021-03-03 MED ORDER — POTASSIUM CHLORIDE 10 MEQ/100ML IV SOLN
10.0000 meq | INTRAVENOUS | Status: AC
  Administered 2021-03-03 (×2): 10 meq via INTRAVENOUS
  Filled 2021-03-03 (×2): qty 100

## 2021-03-03 NOTE — ED Notes (Signed)
Informed rn bed assigned 

## 2021-03-03 NOTE — Progress Notes (Signed)
PT Cancellation Note  Patient Details Name: [name] MRN: [id number] DOB: 03/01/1946   Cancelled Treatment:    Reason Eval/Treat Not Completed: Patient declined, no reason specified. Patient presents with garbled speech, declines PT assessment at this time. Very groggy. Will re-attempt when patient more appropriate.     [name] 03/03/2021, 1:36 PM

## 2021-03-03 NOTE — Progress Notes (Signed)
OT Cancellation Note  Patient Details Name: [name] MRN: [id number] DOB: 1945/04/20   Cancelled Treatment:    Reason Eval/Treat Not Completed: Patient at procedure or test/ unavailable. Pt noted to be off the floor for head CT following recent change in mental status, unavailable at this time. Will continue to follow and initiate services at later date/time as pt available.   Dessie Coma, M.S. OTR/L  03/03/21, 10:16 AM  ascom [phone]

## 2021-03-03 NOTE — Progress Notes (Signed)
PROGRESS NOTE    [name]  QDI:[id number] DOB: Aug 25, 1945 DOA: 03/02/2021 PCP: [name], [name], MD    Brief Narrative:  75 y.o. male with medical history significant for Hypertension, depression, GERD, BPH, history of frequent falls, subarachnoid and intraparenchymal hemorrhage at the left frontal lobe, right falx, and inferior temporal lobes, who presents emergency department from home for chief concerns of frequent falls.   He reports he has been feeling generalized weakness for the last 3 weeks.  He denies any changes to his oral intake, fever, chest pain, shortness of breath, abdominal pain, dysuria, hematuria, diarrhea..  He reports that he recently came back from a trip to [facility] and tested positive for [place].  He reports that his weakness was present prior to the subarachnoid hemorrhage.   His wife recently fell and broke her hip and is home from 8-9 weeks of rehab and he feels that he needs to be home to help his wife.  12/1: Patient reports feeling increasingly weak and lethargic.  Hypokalemia persistent.  Repeat head CT shows improvement in bitemporal contusions and small subarachnoid hemorrhage.   Assessment & Plan:   Principal Problem:   Hypokalemia due to excessive gastrointestinal loss of potassium Active Problems:   Hyperlipidemia   GERD   OSA (obstructive sleep apnea)   RLS (restless legs syndrome)   COPD (chronic obstructive pulmonary disease) (HCC)   Morbid obesity (HCC)   TOBACCO ABUSE   Depression, recurrent (HCC)   Chronic kidney disease (CKD), stage IV (severe) (HCC)   Acute metabolic encephalopathy   SAH (subarachnoid hemorrhage) (HCC)   Essential hypertension   Thrush  Weakness Frequent falls Bitemporal contusion and subarachnoid hemorrhage These likely occurred in the setting of poor p.o. intake as a result of COVID-19 gastroenteritis.  Potassium and magnesium low.  Repeat CT head done 12/1 demonstrates improvement in  subarachnoid bleed.  No vasogenic edema or midline shift noted on imaging. Plan: Fall precautions Therapy evaluations Monitor replace electrolytes RD consult  Hypokalemia Hypomagnesemia Likely in the setting of COVID gastroenteritis Monitor and replace as necessary  COVID positivity Patient states he initially tested positive for COVID on 11/9 P.o. 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  Inability to ambulate safely.  Marked electrolyte disturbances           Level of care: Telemetry Medical  Consultants:  None  Procedures:  None  Antimicrobials: None   Subjective: Patient seen and examined.  Remains weak and lethargic.  Hypokalemia persistent.  Objective: Vitals:   03/03/21 0916 03/03/21 0930 03/03/21 1030 03/03/21 1130  BP: (!) 106/54 (!) 155/73 (!) 148/85 127/80  Pulse:  (!) 57 60 (!) 54  Resp: 20 16 17 11   Temp:      TempSrc:      SpO2:  91% 98% 97%  Weight:      Height:  Intake/Output Summary (Last 24 hours) at 03/03/2021 1304 Last data filed at 03/03/2021 1610 Gross per 24 hour  Intake 1383.6 ml  Output --  Net 1383.6 ml   Filed Weights   03/02/21 1443  Weight: 108.9 kg    Examination:  General exam: No acute distress.  Appears fatigued Respiratory  system: Poor respiratory effort.  Lungs clear.  Normal work of breathing.  Room air Cardiovascular system: S1-S2, RRR, no murmurs, no pedal edema Gastrointestinal system: Obese, NT/ND, normal bowel sounds Central nervous system: Alert and oriented.  No focal deficits Extremities: Symmetrically decreased power bilaterally Skin: No rashes, lesions or ulcers Psychiatry: Judgement and insight appear impaired. Mood & affect flattened.     Data Reviewed: I have personally reviewed following labs and imaging studies  CBC: Recent Labs  Lab 03/02/21 1445 03/03/21 0604  WBC 6.1 5.9  HGB 10.9* 9.9*  HCT 31.7* 28.5*  MCV 88.1 88.0  PLT 189 960   Basic Metabolic Panel: Recent Labs  Lab 02/25/21 0324 03/02/21 1445 03/03/21 0057 03/03/21 0604 03/03/21 0932  NA 138 135  --  137  --   K 3.1* 2.6* 2.7* 3.6 2.6*  CL 111 104  --  106  --   CO2 21* 24  --  24  --   GLUCOSE 90 112*  --  93  --   BUN 21 24*  --  25*  --   CREATININE 2.36* 2.59*  --  2.53*  --   CALCIUM 7.7* 8.2*  --  8.1*  --   MG  --  2.2  --   --  2.0  PHOS 3.0  --   --   --   --    GFR: Estimated Creatinine Clearance: 29.7 mL/min (A) (by C-G formula based on SCr of 2.53 mg/dL (H)). Liver Function Tests: Recent Labs  Lab 02/25/21 0324  ALBUMIN 2.7*   No results for input(s): LIPASE, AMYLASE in the last 168 hours. No results for input(s): AMMONIA in the last 168 hours. Coagulation Profile: No results for input(s): INR, PROTIME in the last 168 hours. Cardiac Enzymes: No results for input(s): CKTOTAL, CKMB, CKMBINDEX, TROPONINI in the last 168 hours. BNP (last 3 results) No results for input(s): PROBNP in the last 8760 hours. HbA1C: No results for input(s): HGBA1C in the last 72 hours. CBG: Recent Labs  Lab 03/02/21 2314 03/03/21 0806 03/03/21 0924 03/03/21 1132  GLUCAP 85 93 151* 148*   Lipid Profile: No results for input(s): CHOL, HDL, LDLCALC, TRIG, CHOLHDL, LDLDIRECT in the last 72 hours. Thyroid  Function Tests: Recent Labs    03/02/21 1445  TSH 3.220   Anemia Panel: Recent Labs    03/03/21 0057  VITAMINB12 204   Sepsis Labs: Recent Labs  Lab 03/02/21 1445  PROCALCITON <0.10    Recent Results (from the past 240 hour(s))  Resp Panel by RT-PCR (Flu A&B, Covid) Nasopharyngeal Swab     Status: Abnormal   Collection Time: 02/23/21  6:28 PM   Specimen: Nasopharyngeal Swab; Nasopharyngeal(NP) swabs in vial transport medium  Result Value Ref Range Status   SARS Coronavirus 2 by RT PCR POSITIVE (A) NEGATIVE Final    Comment: CRITICAL RESULT CALLED TO, READ BACK BY AND VERIFIED WITH: BRANDY GALLOWAY RN,2039,02/23/2021,SELF S (NOTE) SARS-CoV-2 target nucleic acids are DETECTED.  The SARS-CoV-2 RNA is generally detectable in upper respiratory specimens during the acute phase of infection. Positive results are indicative of the presence of the identified virus, but do not rule  out bacterial infection or co-infection with other pathogens not detected by the test. Clinical correlation with patient history and other diagnostic information is necessary to determine patient infection status. The expected result is Negative.  Fact Sheet for Patients: EntrepreneurPulse.com.au  Fact Sheet for Healthcare Providers: IncredibleEmployment.be  This test is not yet approved or cleared by the [place] FDA and  has been authorized for detection and/or diagnosis of SARS-CoV-2 by FDA under an Emergency Use Authorization (EUA).  This EUA will remain in effect (meaning  this test can be used) for the duration of  the COVID-19 declaration under Section 564(b)(1) of the Act, 21 U.S.C. section 360bbb-3(b)(1), unless the authorization is terminated or revoked sooner.     Influenza A by PCR NEGATIVE NEGATIVE Final   Influenza B by PCR NEGATIVE NEGATIVE Final    Comment: (NOTE) The Xpert Xpress SARS-CoV-2/FLU/RSV plus assay is intended as an aid in  the diagnosis of influenza from Nasopharyngeal swab specimens and should not be used as a sole basis for treatment. Nasal washings and aspirates are unacceptable for Xpert Xpress SARS-CoV-2/FLU/RSV testing.  Fact Sheet for Patients: EntrepreneurPulse.com.au  Fact Sheet for Healthcare Providers: IncredibleEmployment.be  This test is not yet approved or cleared by the [place] FDA and has been authorized for detection and/or diagnosis of SARS-CoV-2 by FDA under an Emergency Use Authorization (EUA). This EUA will remain in effect (meaning this test can be used) for the duration of the COVID-19 declaration under Section 564(b)(1) of the Act, 21 U.S.C. section 360bbb-3(b)(1), unless the authorization is terminated or revoked.  Performed at [facility] : [facility], [address] [place], [place] [address]   Resp Panel by RT-PCR (Flu A&B, Covid) Nasopharyngeal Swab     Status: Abnormal   Collection Time: 03/02/21  9:03 PM   Specimen: Nasopharyngeal Swab; Nasopharyngeal(NP) swabs in vial transport medium  Result Value Ref Range Status   SARS Coronavirus 2 by RT PCR POSITIVE (A) NEGATIVE Final    Comment: RESULT CALLED TO, READ BACK BY AND VERIFIED WITH: ALYCIA BEVERLY @2303  03/02/21 RH (NOTE) SARS-CoV-2 target nucleic acids are DETECTED.  The SARS-CoV-2 RNA is generally detectable in upper respiratory specimens during the acute phase of infection. Positive results are indicative of the presence of the identified virus, but do not rule out bacterial infection or co-infection with other pathogens not detected by the test. Clinical correlation with patient history and other diagnostic information is necessary to determine patient infection status. The expected result is Negative.  Fact Sheet for Patients: EntrepreneurPulse.com.au  Fact Sheet for Healthcare Providers: IncredibleEmployment.be  This test is not yet  approved or cleared by the [place] FDA and  has been authorized for detection and/or diagnosis of SARS-CoV-2 by FDA under an Emergency Use Authorization (EUA).  This EUA will remain in effect (meaning this test can be  used) for the duration of  the COVID-19 declaration under Section 564(b)(1) of the Act, 21 U.S.C. section 360bbb-3(b)(1), unless the authorization is terminated or revoked sooner.     Influenza A by PCR NEGATIVE NEGATIVE Final   Influenza B by PCR NEGATIVE NEGATIVE Final    Comment: (NOTE) The Xpert Xpress SARS-CoV-2/FLU/RSV plus assay is intended as an aid in the diagnosis of influenza from Nasopharyngeal swab specimens and should not be used as a sole basis for treatment. Nasal washings and aspirates are unacceptable for Xpert Xpress SARS-CoV-2/FLU/RSV testing.  Fact Sheet for Patients: EntrepreneurPulse.com.au  Fact Sheet for Healthcare Providers: IncredibleEmployment.be  This test is not yet approved  or cleared by the [place] and has been authorized for detection and/or diagnosis of SARS-CoV-2 by FDA under an Emergency Use Authorization (EUA). This EUA will remain in effect (meaning this test can be used) for the duration of the COVID-19 declaration under Section 564(b)(1) of the Act, 21 U.S.C. section 360bbb-3(b)(1), unless the authorization is terminated or revoked.  Performed at [facility], [address] [place], [place] [address]          Radiology Studies: CT HEAD WO CONTRAST (5MM)  Result Date: 03/03/2021 CLINICAL DATA:  Fall with head trauma 1 week ago. Mental status worsening. EXAM: CT HEAD WITHOUT CONTRAST TECHNIQUE: Contiguous axial images were obtained from the base of the skull through the vertex without intravenous contrast. COMPARISON:  03/02/2021 and multiple previous FINDINGS: Brain: Hemorrhagic contusions at both temporal tips in the right inferior frontal lobe are becoming  less dense. No additional bleeding in those locations. There is only mild associated edema. Small amount of subarachnoid blood remains visible in the left frontal region, also diminishing. No new finding. No sign of ischemic infarction, hydrocephalus or subdural hematoma. Vascular: There is atherosclerotic calcification of the major vessels at the base of the brain. Skull: Negative Sinuses/Orbits: Clear/normal Other: None IMPRESSION: Intraparenchymal contusions at both temporal tips in the inferior right frontal lobe are becoming less dense. Subarachnoid blood in the left frontal region is becoming less dense. No new hemorrhage. No worsening or other new finding. Electronically Signed   By: Nelson Chimes M.D.   On: 03/03/2021 10:31   CT HEAD WO CONTRAST  Result Date: 03/02/2021 CLINICAL DATA:  Follow-up hemorrhage EXAM: CT HEAD WITHOUT CONTRAST TECHNIQUE: Contiguous axial images were obtained from the base of the skull through the vertex without intravenous contrast. COMPARISON:  Head CT dated February 28, 2021 FINDINGS: Brain: Subarachnoid and intraparenchymal hemorrhage again seen over the left frontal lobe, right falx, and inferior temporal lobes, slightly decreased density when compared with prior exam. No new hemorrhage. No mass effect or midline shift. Chronic white matter ischemic change. Vascular: No hyperdense vessel or unexpected calcification. Skull: Normal. Negative for fracture or focal lesion. Sinuses/Orbits: Mild mucosal thickening of the ethmoid sinuses. No acute finding. Other: None. IMPRESSION: Evolving subarachnoid and intraparenchymal hemorrhage. No new hemorrhage. No mass effect or midline shift. Electronically Signed   By: Yetta Glassman M.D.   On: 03/02/2021 16:21        Scheduled Meds:  abiraterone acetate  500 mg Oral Daily   benzonatate  200 mg Oral TID   carvedilol  12.5 mg Oral BID   DULoxetine  60 mg Oral Daily   feeding supplement  237 mL Oral BID BM   levETIRAcetam   500 mg Oral BID   nystatin  5 mL Oral QID   pantoprazole  40 mg Oral BID AC   rosuvastatin  20 mg Oral Daily   Continuous Infusions:  potassium chloride 10 mEq (03/03/21 1242)     LOS: 0 days    Time spent: 35 minutes    Sidney Ace, MD [facility]   If 7PM-7AM, please contact night-coverage  03/03/2021, 1:04 PM

## 2021-03-03 NOTE — Evaluation (Signed)
Occupational Therapy Evaluation Patient Details Name: CHRISTOBAL MORADO MRN: [id number] DOB: 1945-05-30 Today's Date: 03/03/2021   History of Present Illness DONTEL HARSHBERGER is a 75 y.o. male with medical history significant for Hypertension, depression, GERD, BPH, history of frequent falls, subarachnoid and intraparenchymal hemorrhage at the left frontal lobe, right falx, and inferior temporal lobes, COV+ 02/06/21. Pt presents emergency department from home for chief concerns of frequent falls.   Clinical Impression   Mr. Windle Guard was seen for OT evaluation this date. Prior to hospital admission, pt was independent/mod I. Pt lives with/is caring for his wife who is currently recovering from a broken hip. Currently pt demonstrates impairments as described below (See OT problem list) which functionally limit his ability to perform ADL/self-care tasks. Pt currently requires MOD A for sup>sit.  MAX A for LBD. Pt tolerates about 2 min of sitting w/ GCA, anticipate MIN A for seated grooming. Pt is orthostatic, limiting further tx.  Pt would benefit from skilled OT services to address noted impairments and functional limitations (see below for any additional details) in order to maximize safety and independence while minimizing falls risk and caregiver burden. Upon hospital discharge, recommend STR to maximize pt safety and return to PLOF.     Lying: BP 146/73 (MAP 93) HR 54, O2 98 on RA Sitting: BP 87/48 (MAP 60) HR 63 O2 98 on RA - dizziness noted, resolves return to supine, RN/MD notified   Recommendations for follow up therapy are one component of a multi-disciplinary discharge planning process, led by the attending physician.  Recommendations may be updated based on patient status, additional functional criteria and insurance authorization.   Follow Up Recommendations  Skilled nursing-short term rehab (<3 hours/day)    Assistance Recommended at Discharge Frequent or constant Supervision/Assistance   Functional Status Assessment  Patient has had a recent decline in their functional status and demonstrates the ability to make significant improvements in function in a reasonable and predictable amount of time.  Equipment Recommendations  Other (comment) (Defer to next venue of care)    Recommendations for Other Services       Precautions / Restrictions Precautions Precautions: Fall Precaution Comments: check BP Restrictions Weight Bearing Restrictions: No      Mobility Bed Mobility Overal bed mobility: Needs Assistance Bed Mobility: Supine to Sit;Sit to Supine     Supine to sit: Mod assist Sit to supine: Min guard        Transfers                   General transfer comment: deferred 2/2 orthostatics      Balance Overall balance assessment: Needs assistance;History of Falls Sitting-balance support: Feet unsupported;No upper extremity supported Sitting balance-Leahy Scale: Fair                                     ADL either performed or assessed with clinical judgement   ADL Overall ADL's : Needs assistance/impaired                                       General ADL Comments: MAX A for LBD. Pt tolerates about 2 min of sitting w/ GCA, anticipate MIN A for seated grooming.      Pertinent Vitals/Pain Pain Assessment: No/denies pain     Hand Dominance  Extremity/Trunk Assessment Upper Extremity Assessment Upper Extremity Assessment: Generalized weakness   Lower Extremity Assessment Lower Extremity Assessment: Generalized weakness       Communication Communication Communication: No difficulties   Cognition Arousal/Alertness: Awake/alert Behavior During Therapy: WFL for tasks assessed/performed Overall Cognitive Status: No family/caregiver present to determine baseline cognitive functioning                                 General Comments: A&O x 3- pt oriented to self, time, and place but not  situation- denying falls     General Comments  Lying 146/73 (MAP 93) HR- 54, O2 98 on RA, Sitting BP drop- 87/48 (MAP 60) HR 63 O2 98 on RA    Exercises Exercises: Other exercises Other Exercises Other Exercises: Pt educ re: OT role, d/c recs Other Exercises: Sup<>sit, orthostatics - drop prevented further tx, sitting balance/tolerance, LB access, bed mobility   Shoulder Instructions      Home Living Family/patient expects to be discharged to:: Private residence Living Arrangements: Spouse/significant other Available Help at Discharge: Family;Available 24 hours/day Type of Home: House Home Access: Stairs to enter [employer] of Steps: 6 Entrance Stairs-Rails: Right Home Layout: One level     Bathroom Shower/Tub: Occupational psychologist: Standard     Home Equipment: Conservation officer, nature (2 wheels);Cane - single point          Prior Functioning/Environment Prior Level of Function : Independent/Modified Independent             Mobility Comments: Uses SPC and RW intermittently          OT Problem List: Decreased strength;Decreased range of motion;Decreased activity tolerance;Impaired balance (sitting and/or standing);Decreased coordination;Decreased safety awareness;Decreased knowledge of use of DME or AE;Decreased knowledge of precautions      OT Treatment/Interventions: Self-care/ADL training;Therapeutic exercise;Energy conservation;DME and/or AE instruction;Therapeutic activities    OT Goals(Current goals can be found in the care plan section) Acute Rehab OT Goals Patient Stated Goal: to care for his wife OT Goal Formulation: With patient Time For Goal Achievement: 03/17/21 Potential to Achieve Goals: Good ADL Goals Pt Will Perform Grooming: with supervision;with set-up;sitting (will tolerate >5 min of sitting) Pt Will Perform Lower Body Dressing: with mod assist;sitting/lateral leans Pt Will Transfer to Toilet: bedside commode;with  supervision (rolling at bedlevel)  OT Frequency: Min 2X/week   Barriers to D/C: Decreased caregiver support          Co-evaluation              AM-PAC OT "6 Clicks" Daily Activity     Outcome Measure Help from another person eating meals?: A Little Help from another person taking care of personal grooming?: A Little Help from another person toileting, which includes using toliet, bedpan, or urinal?: A Lot Help from another person bathing (including washing, rinsing, drying)?: A Lot Help from another person to put on and taking off regular upper body clothing?: A Lot Help from another person to put on and taking off regular lower body clothing?: A Lot 6 Click Score: 14   End of Session Nurse Communication: Other (comment);Mobility status (Notified orthostatic)  Activity Tolerance: Treatment limited secondary to medical complications (Comment);Other (comment) (BP drop) Patient left: in bed;with call bell/phone within reach  OT Visit Diagnosis: Unsteadiness on feet (R26.81);Other abnormalities of gait and mobility (R26.89);Muscle weakness (generalized) (M62.81);History of falling (Z91.81)  Time: 3748-2707 OT Time Calculation (min): 20 min Charges:  OT General Charges $OT Visit: 1 Visit OT Evaluation $OT Eval Moderate Complexity: 1 Mod  Nino Glow, Markus Daft 03/03/2021, 3:35 PM

## 2021-03-03 NOTE — Progress Notes (Signed)
Went into patient room to reposition BP cuff patient noted to be drowsy and leaning to the right on stretcher. Pt stated "I feel like I'm dying". Patient weak in all extremities and speech is slurred. Patient answers all question appropriately. MD called to bedside to assess. CT scan of head ordered. Patient remains in room at this time.

## 2021-03-03 NOTE — Progress Notes (Signed)
Pt transported to CT ?

## 2021-03-04 ENCOUNTER — Encounter: Payer: Self-pay | Admitting: Internal Medicine

## 2021-03-04 LAB — BASIC METABOLIC PANEL
Anion gap: 5 (ref 5–15)
BUN: 21 mg/dL (ref 8–23)
CO2: 22 mmol/L (ref 22–32)
Calcium: 8 mg/dL — ABNORMAL LOW (ref 8.9–10.3)
Chloride: 109 mmol/L (ref 98–111)
Creatinine, Ser: 2.04 mg/dL — ABNORMAL HIGH (ref 0.61–1.24)
GFR, Estimated: 33 mL/min — ABNORMAL LOW (ref >60)
Glucose, Bld: 91 mg/dL (ref 70–99)
Potassium: 3.3 mmol/L — ABNORMAL LOW (ref 3.5–5.1)
Sodium: 136 mmol/L (ref 135–145)

## 2021-03-04 LAB — CBC WITH DIFFERENTIAL/PLATELET
Abs Immature Granulocytes: 0.02 10*3/uL (ref 0.00–0.07)
Basophils Absolute: 0 10*3/uL (ref 0.0–0.1)
Basophils Relative: 1 %
Eosinophils Absolute: 0.4 10*3/uL (ref 0.0–0.5)
Eosinophils Relative: 7 %
HCT: 30.2 % — ABNORMAL LOW (ref 39.0–52.0)
Hemoglobin: 10.4 g/dL — ABNORMAL LOW (ref 13.0–17.0)
Immature Granulocytes: 0 %
Lymphocytes Relative: 15 %
Lymphs Abs: 0.8 10*3/uL (ref 0.7–4.0)
MCH: 30.4 pg (ref 26.0–34.0)
MCHC: 34.4 g/dL (ref 30.0–36.0)
MCV: 88.3 fL (ref 80.0–100.0)
Monocytes Absolute: 0.3 10*3/uL (ref 0.1–1.0)
Monocytes Relative: 6 %
Neutro Abs: 3.8 10*3/uL (ref 1.7–7.7)
Neutrophils Relative %: 71 %
Platelets: 152 10*3/uL (ref 150–400)
RBC: 3.42 MIL/uL — ABNORMAL LOW (ref 4.22–5.81)
RDW: 14.6 % (ref 11.5–15.5)
WBC: 5.4 10*3/uL (ref 4.0–10.5)
nRBC: 0 % (ref 0.0–0.2)

## 2021-03-04 LAB — URINALYSIS, ROUTINE W REFLEX MICROSCOPIC
Bacteria, UA: NONE SEEN
Bilirubin Urine: NEGATIVE
Glucose, UA: NEGATIVE mg/dL
Ketones, ur: NEGATIVE mg/dL
Leukocytes,Ua: NEGATIVE
Nitrite: NEGATIVE
Protein, ur: 30 mg/dL — AB
Specific Gravity, Urine: 1.005 (ref 1.005–1.030)
Squamous Epithelial / HPF: NONE SEEN (ref 0–5)
pH: 7 (ref 5.0–8.0)

## 2021-03-04 MED ORDER — HYDRALAZINE HCL 20 MG/ML IJ SOLN: 10.0000 mg | INTRAMUSCULAR | Status: DC | PRN

## 2021-03-04 MED ORDER — CARVEDILOL 6.25 MG PO TABS
6.2500 mg | ORAL_TABLET | Freq: Two times a day (BID) | ORAL | Status: DC
  Administered 2021-03-04 – 2021-03-05 (×3): 6.25 mg via ORAL
  Filled 2021-03-04 (×3): qty 1

## 2021-03-04 MED ORDER — AMLODIPINE BESYLATE 5 MG PO TABS
5.0000 mg | ORAL_TABLET | Freq: Every day | ORAL | Status: DC
  Administered 2021-03-04 – 2021-03-05 (×2): 5 mg via ORAL
  Filled 2021-03-04 (×2): qty 1

## 2021-03-04 MED ORDER — POTASSIUM CHLORIDE CRYS ER 20 MEQ PO TBCR
40.0000 meq | EXTENDED_RELEASE_TABLET | Freq: Once | ORAL | Status: AC
  Administered 2021-03-04: 40 meq via ORAL
  Filled 2021-03-04: qty 2

## 2021-03-04 MED ORDER — ADULT MULTIVITAMIN W/MINERALS CH
1.0000 | ORAL_TABLET | Freq: Every day | ORAL | Status: DC
  Administered 2021-03-04 – 2021-03-05 (×2): 1 via ORAL
  Filled 2021-03-04 (×2): qty 1

## 2021-03-04 NOTE — Progress Notes (Signed)
PROGRESS NOTE    [name]  LKT:[id number] DOB: 04-15-1945 DOA: 03/02/2021 PCP: [name], [name], MD    Brief Narrative:  75 y.o. male with medical history significant for Hypertension, depression, GERD, BPH, history of frequent falls, subarachnoid and intraparenchymal hemorrhage at the left frontal lobe, right falx, and inferior temporal lobes, who presents emergency department from home for chief concerns of frequent falls.   He reports he has been feeling generalized weakness for the last 3 weeks.  He denies any changes to his oral intake, fever, chest pain, shortness of breath, abdominal pain, dysuria, hematuria, diarrhea..  He reports that he recently came back from a trip to [facility] and tested positive for [place].  He reports that his weakness was present prior to the subarachnoid hemorrhage.   His wife recently fell and broke her hip and is home from 8-9 weeks of rehab and he feels that he needs to be home to help his wife.  12/1: Patient reports feeling increasingly weak and lethargic.  Hypokalemia persistent.  Repeat head CT shows improvement in bitemporal contusions and small subarachnoid hemorrhage.  12/2: Patient feeling better.  P.o. intake improved.  Potassium improved   Assessment & Plan:   Principal Problem:   Hypokalemia due to excessive gastrointestinal loss of potassium Active Problems:   Hyperlipidemia   GERD   OSA (obstructive sleep apnea)   RLS (restless legs syndrome)   COPD (chronic obstructive pulmonary disease) (HCC)   Morbid obesity (HCC)   TOBACCO ABUSE   Depression, recurrent (HCC)   Chronic kidney disease (CKD), stage IV (severe) (HCC)   Acute metabolic encephalopathy   SAH (subarachnoid hemorrhage) ([place])   Essential hypertension   Thrush   Gastroenteritis due to COVID-19 virus  Weakness Frequent falls Bitemporal contusion and subarachnoid hemorrhage Improved.  Likely in setting of poor p.o. intake secondary to COVID.   Appetite improving.  No vasogenic edema or midline shift Plan: Fall precautions Therapy evaluations, current recommendation for skilled nursing facility the patient is reluctant due to need for care for his wife Monitor replace electrolytes RD consult  Hypokalemia Hypomagnesemia Likely in the setting of COVID gastroenteritis Monitor and replace as necessary Improving over interval  COVID positivity Patient states he initially tested positive for COVID on 11/9 P.o. 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  Anticipate medical readiness for discharge in 24 hours                 Level of care: Telemetry Medical  Consultants:  None  Procedures:  None  Antimicrobials: None   Subjective: Patient seen and examined.  More awake this morning.  Hypokalemia improved  Objective: Vitals:   03/03/21 2210 03/04/21 0415 03/04/21 0731 03/04/21 0934  BP: (!) 157/72 (!) 164/77 (!) 180/73 (!) 160/82  Pulse: (!) 54 [name])  55 (!) 57 (!) 56  Resp:  18 18   Temp:  (!) 97.4 F (36.3 C) (!) 97.5 F (36.4 C) 97.6 F (36.4 C)  TempSrc:  Oral Oral Oral  SpO2:  99% 98%    Weight:      Height:        Intake/Output Summary (Last 24 hours) at 03/04/2021 1128 Last data filed at 03/04/2021 1038 Gross per 24 hour  Intake 1593.55 ml  Output 900 ml  Net 693.55 ml   Filed Weights   03/02/21 1443 03/03/21 2014  Weight: 108.9 kg 105 kg    Examination:  General exam: No acute distress.  Resting comfortably in bed Respiratory system: Clear lungs.  Normal work of breathing.  Room air Cardiovascular system: S1-S2, RRR, no murmurs, no pedal edema Gastrointestinal system: Obese, NT/ND, normal bowel sounds Central nervous system: Alert and oriented.  No focal deficits Extremities: Symmetrically decreased power bilaterally Skin: No rashes, lesions or ulcers Psychiatry: Judgement and insight appear appropriate. Mood & affect normal.     Data Reviewed: I have personally reviewed following labs and imaging studies  CBC: Recent Labs  Lab 03/02/21 1445 03/03/21 0604 03/04/21 0745  WBC 6.1 5.9 5.4  NEUTROABS  --   --  3.8  HGB 10.9* 9.9* 10.4*  HCT 31.7* 28.5* 30.2*  MCV 88.1 88.0 88.3  PLT 189 160 481   Basic Metabolic Panel: Recent Labs  Lab 03/02/21 1445 03/03/21 0057 03/03/21 0604 03/03/21 0932 03/04/21 0745  NA 135  --  137  --  136  K 2.6* 2.7* 3.6 2.6* 3.3*  CL 104  --  106  --  109  CO2 24  --  24  --  22  GLUCOSE 112*  --  93  --  91  BUN 24*  --  25*  --  21  CREATININE 2.59*  --  2.53*  --  2.04*  CALCIUM 8.2*  --  8.1*  --  8.0*  MG 2.2  --   --  2.0  --    GFR: Estimated Creatinine Clearance: 36.2 mL/min (A) (by C-G formula based on SCr of 2.04 mg/dL (H)). Liver Function Tests: No results for input(s): AST, ALT, ALKPHOS, BILITOT, PROT, ALBUMIN in the last 168 hours.  No results for input(s): LIPASE, AMYLASE in the last 168 hours. No results for input(s): AMMONIA in the last 168 hours. Coagulation Profile: No results for input(s): INR, PROTIME in the last 168 hours. Cardiac Enzymes: No results for input(s): CKTOTAL, CKMB,  CKMBINDEX, TROPONINI in the last 168 hours. BNP (last 3 results) No results for input(s): PROBNP in the last 8760 hours. HbA1C: Recent Labs    03/02/21 1445  HGBA1C 5.4   CBG: Recent Labs  Lab 03/02/21 2314 03/03/21 0806 03/03/21 0924 03/03/21 1132  GLUCAP 85 93 151* 148*   Lipid Profile: No results for input(s): CHOL, HDL, LDLCALC, TRIG, CHOLHDL, LDLDIRECT in the last 72 hours. Thyroid Function Tests: Recent Labs    03/02/21 1445  TSH 3.220   Anemia Panel: Recent Labs    03/03/21 0057  VITAMINB12 204   Sepsis Labs: Recent Labs  Lab 03/02/21 1445  PROCALCITON <0.10    Recent Results (from the past 240 hour(s))  Resp Panel by RT-PCR (Flu A&B, Covid) Nasopharyngeal Swab     Status: Abnormal   Collection Time: 02/23/21  6:28 PM   Specimen: Nasopharyngeal Swab; Nasopharyngeal(NP) swabs in vial transport medium  Result Value Ref Range Status   SARS  Coronavirus 2 by RT PCR POSITIVE (A) NEGATIVE Final    Comment: CRITICAL RESULT CALLED TO, READ BACK BY AND VERIFIED WITH: BRANDY GALLOWAY RN,2039,02/23/2021,SELF S (NOTE) SARS-CoV-2 target nucleic acids are DETECTED.  The SARS-CoV-2 RNA is generally detectable in upper respiratory specimens during the acute phase of infection. Positive results are indicative of the presence of the identified virus, but do not rule out bacterial infection or co-infection with other pathogens not detected by the test. Clinical correlation with patient history and other diagnostic information is necessary to determine patient infection status. The expected result is Negative.  Fact Sheet for Patients: EntrepreneurPulse.com.au  Fact Sheet for Healthcare Providers: IncredibleEmployment.be  This test is not yet approved or cleared by the [place] FDA and  has been authorized for detection and/or diagnosis of SARS-CoV-2 by FDA under an Emergency Use Authorization (EUA).  This EUA will remain in  effect (meaning  this test can be used) for the duration of  the COVID-19 declaration under Section 564(b)(1) of the Act, 21 U.S.C. section 360bbb-3(b)(1), unless the authorization is terminated or revoked sooner.     Influenza A by PCR NEGATIVE NEGATIVE Final   Influenza B by PCR NEGATIVE NEGATIVE Final    Comment: (NOTE) The Xpert Xpress SARS-CoV-2/FLU/RSV plus assay is intended as an aid in the diagnosis of influenza from Nasopharyngeal swab specimens and should not be used as a sole basis for treatment. Nasal washings and aspirates are unacceptable for Xpert Xpress SARS-CoV-2/FLU/RSV testing.  Fact Sheet for Patients: EntrepreneurPulse.com.au  Fact Sheet for Healthcare Providers: IncredibleEmployment.be  This test is not yet approved or cleared by the [place] FDA and has been authorized for detection and/or diagnosis of SARS-CoV-2 by FDA under an Emergency Use Authorization (EUA). This EUA will remain in effect (meaning this test can be used) for the duration of the COVID-19 declaration under Section 564(b)(1) of the Act, 21 U.S.C. section 360bbb-3(b)(1), unless the authorization is terminated or revoked.  Performed at [facility], [address], [place], [place] [address]   Resp Panel by RT-PCR (Flu A&B, Covid) Nasopharyngeal Swab     Status: Abnormal   Collection Time: 03/02/21  9:03 PM   Specimen: Nasopharyngeal Swab; Nasopharyngeal(NP) swabs in vial transport medium  Result Value Ref Range Status   SARS Coronavirus 2 by RT PCR POSITIVE (A) NEGATIVE Final    Comment: RESULT CALLED TO, READ BACK BY AND VERIFIED WITH: ALYCIA BEVERLY @2303  03/02/21 RH (NOTE) SARS-CoV-2 target nucleic acids are DETECTED.  The SARS-CoV-2 RNA is generally detectable in upper respiratory specimens during the acute phase of infection. Positive results are indicative of the presence of the identified virus, but do not rule out bacterial infection or  co-infection with other pathogens not detected by the test. Clinical correlation with patient history and other diagnostic information is necessary to determine patient infection status. The expected result is Negative.  Fact Sheet for Patients: EntrepreneurPulse.com.au  Fact Sheet for Healthcare Providers: IncredibleEmployment.be  This test is not yet approved or cleared by the [place] FDA and  has been authorized for detection and/or diagnosis of SARS-CoV-2 by FDA under an Emergency Use Authorization (EUA).  This EUA will remain in effect (meaning this test can be  used) for the duration of  the COVID-19 declaration under Section 564(b)(1) of the Act, 21 U.S.C. section 360bbb-3(b)(1), unless the authorization is terminated or revoked sooner.     Influenza A by PCR NEGATIVE NEGATIVE Final   Influenza B by PCR NEGATIVE NEGATIVE Final  Comment: (NOTE) The Xpert Xpress SARS-CoV-2/FLU/RSV plus assay is intended as an aid in the diagnosis of influenza from Nasopharyngeal swab specimens and should not be used as a sole basis for treatment. Nasal washings and aspirates are unacceptable for Xpert Xpress SARS-CoV-2/FLU/RSV testing.  Fact Sheet for Patients: EntrepreneurPulse.com.au  Fact Sheet for Healthcare Providers: IncredibleEmployment.be  This test is not yet approved or cleared by the [place] FDA and has been authorized for detection and/or diagnosis of SARS-CoV-2 by FDA under an Emergency Use Authorization (EUA). This EUA will remain in effect (meaning this test can be used) for the duration of the COVID-19 declaration under Section 564(b)(1) of the Act, 21 U.S.C. section 360bbb-3(b)(1), unless the authorization is terminated or revoked.  Performed at [facility], [address], [place], [place] [address]          Radiology Studies: CT HEAD WO CONTRAST (5MM)  Result  Date: 03/03/2021 CLINICAL DATA:  Fall with head trauma 1 week ago. Mental status worsening. EXAM: CT HEAD WITHOUT CONTRAST TECHNIQUE: Contiguous axial images were obtained from the base of the skull through the vertex without intravenous contrast. COMPARISON:  03/02/2021 and multiple previous FINDINGS: Brain: Hemorrhagic contusions at both temporal tips in the right inferior frontal lobe are becoming less dense. No additional bleeding in those locations. There is only mild associated edema. Small amount of subarachnoid blood remains visible in the left frontal region, also diminishing. No new finding. No sign of ischemic infarction, hydrocephalus or subdural hematoma. Vascular: There is atherosclerotic calcification of the major vessels at the base of the brain. Skull: Negative Sinuses/Orbits: Clear/normal Other: None IMPRESSION: Intraparenchymal contusions at both temporal tips in the inferior right frontal lobe are becoming less dense. Subarachnoid blood in the left frontal region is becoming less dense. No new hemorrhage. No worsening or other new finding. Electronically Signed   By: Nelson Chimes M.D.   On: 03/03/2021 10:31   CT HEAD WO CONTRAST  Result Date: 03/02/2021 CLINICAL DATA:  Follow-up hemorrhage EXAM: CT HEAD WITHOUT CONTRAST TECHNIQUE: Contiguous axial images were obtained from the base of the skull through the vertex without intravenous contrast. COMPARISON:  Head CT dated February 28, 2021 FINDINGS: Brain: Subarachnoid and intraparenchymal hemorrhage again seen over the left frontal lobe, right falx, and inferior temporal lobes, slightly decreased density when compared with prior exam. No new hemorrhage. No mass effect or midline shift. Chronic white matter ischemic change. Vascular: No hyperdense vessel or unexpected calcification. Skull: Normal. Negative for fracture or focal lesion. Sinuses/Orbits: Mild mucosal thickening of the ethmoid sinuses. No acute finding. Other: None. IMPRESSION:  Evolving subarachnoid and intraparenchymal hemorrhage. No new hemorrhage. No mass effect or midline shift. Electronically Signed   By: Yetta Glassman M.D.   On: 03/02/2021 16:21        Scheduled Meds:  abiraterone acetate  500 mg Oral Daily   amLODipine  5 mg Oral Daily   benzonatate  200 mg Oral TID   carvedilol  6.25 mg Oral BID   DULoxetine  60 mg Oral Daily   feeding supplement  237 mL Oral BID BM   levETIRAcetam  500 mg Oral BID   nystatin  5 mL Oral QID   pantoprazole  40 mg Oral BID AC   rosuvastatin  20 mg Oral Daily   tamsulosin  0.4 mg Oral QPC supper   Continuous Infusions:     LOS: 1 day    Time spent: 25 minutes    Sidney Ace, MD  [facility]   If 7PM-7AM, please contact night-coverage  03/04/2021, 11:28 AM

## 2021-03-04 NOTE — Progress Notes (Signed)
Initial Nutrition Assessment  DOCUMENTATION CODES:   Obesity unspecified  INTERVENTION:   -Ensure Enlive po BID, each supplement provides 350 kcal and 20 grams of protein  -MVI with minerals daily  NUTRITION DIAGNOSIS:   Inadequate oral intake related to decreased appetite as evidenced by per patient/family report.  GOAL:   Patient will meet greater than or equal to 90% of their needs  MONITOR:   PO intake, Supplement acceptance, Labs, Weight trends, Skin, I & O's  REASON FOR ASSESSMENT:   Consult Assessment of nutrition requirement/status  ASSESSMENT:   [name] is a 75 y.o. male with medical history significant for Hypertension, depression, GERD, BPH, history of frequent falls, subarachnoid and intraparenchymal hemorrhage at the left frontal lobe, right falx, and inferior temporal lobes, who presents emergency department from home for chief concerns of frequent falls.  Pt admitted with frequent falls.   Reviewed I/O's: +334 ml x 24 hours and +1.7 L since admission   Spoke with pt at bedside, who reports he has experienced a general decline in health over the past 1-2 months after being diagnosed with COVID. Pt shares he had extremely minimal intake ("nothing") for the past 1.5 months secondary to lack of smell and taste. Attempted to probe pt for diet recall, but he continued to insists that he did not drink or eat anything during this time period.   Per pt, his sense of taste has improved, but has not fully recovered. He shares he consumed all of breakfast this morning. Noted meal completions 80%.   Per pt, his UBW is around 270#. He estimates that he has lost about 50# in the past 2 months. Reviewed wt hx; pt has experienced a 7.7% wt loss over the past 3 months, which is significant for time frame.   Discussed importance of good meal and supplement intake to promote healing. Pt amenable to Ensure. He shares that he plans to discharge to SNF for short term rehab  tomorrow.   Medications reviewed and include keppra.   Labs reviewed: K: 3.3, CBGS: 93-151 (inpatient orders for glycemic control are none).    NUTRITION - FOCUSED PHYSICAL EXAM:  Flowsheet Row Most Recent Value  Orbital Region No depletion  Upper Arm Region No depletion  Thoracic and Lumbar Region No depletion  Buccal Region No depletion  Temple Region No depletion  Clavicle Bone Region No depletion  Clavicle and Acromion Bone Region No depletion  Scapular Bone Region No depletion  Dorsal Hand Mild depletion  Patellar Region Mild depletion  Anterior Thigh Region Mild depletion  Posterior Calf Region Mild depletion  Edema (RD Assessment) Mild  Hair Reviewed  Eyes Reviewed  Mouth Reviewed  Skin Reviewed  Nails Reviewed       Diet Order:   Diet Order             Diet regular Room service appropriate? Yes; Fluid consistency: Thin  Diet effective now                   EDUCATION NEEDS:   Education needs have been addressed  Skin:  Skin Assessment: Reviewed RN Assessment  Last BM:  Unknown  Height:   Ht Readings from Last 1 Encounters:  03/03/21 5' 7.99" (1.727 m)    Weight:   Wt Readings from Last 1 Encounters:  03/03/21 105 kg    Ideal Body Weight:  70 kg  BMI:  Body mass index is 35.21 kg/m.  Estimated Nutritional Needs:   Kcal:  1900-2100  Protein:  105-120 grams  Fluid:  > 1.9 L    [name], RD, LDN, [place] Registered Dietitian II Certified Diabetes Care and Education Specialist Please refer to [facility] for RD and/or RD on-call/weekend/after hours pager

## 2021-03-04 NOTE — Plan of Care (Signed)
  Problem: Clinical Measurements: Goal: Ability to maintain clinical measurements within normal limits will improve Outcome: Progressing Goal: Will remain free from infection Outcome: Progressing Goal: Diagnostic test results will improve Outcome: Progressing Goal: Respiratory complications will improve Outcome: Progressing   Pt is alert and orientedx4 but forgetful. HR in 50's and Bp elevated - catapres prn given. Denies any pain or SOB. Still feels fatigue.

## 2021-03-04 NOTE — Evaluation (Addendum)
Physical Therapy Evaluation Patient Details Name: [name] MRN: [id number] DOB: 1945-12-27 Today's Date: 03/04/2021  History of Present Illness     Clinical Impression  Pt received supine in bed, agreeable to therapy. He states he is tired and asked why his eyes kept closing. PT turned on the lights and engaged pt in mobility and active conversation - wakefulness, response time and slurred speech all improved by mid-session. Pt did decline ambulation this date but he was agreeable to standing and performing stand-pivot transfer to recliner. Pt was generally CGA with mobility with mild steadying assist in standing and frequent VC for body mechanics/technique to assist pt find success. Ambulation and navigation of stairs to be assessed at later date. Pt strength WFL in BLE with mild weakness noted in B hip flexors.   Pt lives at home with his wife who had a recent hip fracture - pt has been care giving for his wife since fx. He must navigate 6 steps to enter home. Rec SNF at this time as ambulation and stairs have not been assessed. Would benefit from skilled PT to address above deficits and promote optimal return to PLOF.      Recommendations for follow up therapy are one component of a multi-disciplinary discharge planning process, led by the attending physician.  Recommendations may be updated based on patient status, additional functional criteria and insurance authorization.  Follow Up Recommendations Skilled nursing-short term rehab (<3 hours/day)    Assistance Recommended at Discharge Frequent or constant Supervision/Assistance  Functional Status Assessment Patient has had a recent decline in their functional status and demonstrates the ability to make significant improvements in function in a reasonable and predictable amount of time.  Equipment Recommendations  BSC/3in1    Recommendations for Other Services       Precautions / Restrictions Precautions Precautions:  Fall Restrictions Weight Bearing Restrictions: No      Mobility  Bed Mobility Overal bed mobility: Needs Assistance Bed Mobility: Supine to Sit     Supine to sit: Min guard;HOB elevated     General bed mobility comments: requires HOB elevated and use of bed rails; increased time and effort    Transfers Overall transfer level: Needs assistance Equipment used: Rolling walker (2 wheels) Transfers: Sit to/from Stand;Bed to chair/wheelchair/BSC Sit to Stand: Min guard Stand pivot transfers: Min guard         General transfer comment: initially unable to lift hips suffuciently to clear surface - VC on hand placement improved STS to CGA for mild steadying assist    Ambulation/Gait               General Gait Details: not performed - pt stating he is not up to walking today  Stairs            Wheelchair Mobility    Modified Rankin (Stroke Patients Only)       Balance Overall balance assessment: Needs assistance;History of Falls Sitting-balance support: Feet supported;No upper extremity supported Sitting balance-Leahy Scale: Fair Sitting balance - Comments: EOB without assist, no LOB during LE and UE MMT   Standing balance support: Bilateral upper extremity supported;During functional activity Standing balance-Leahy Scale: Poor Standing balance comment: bil UE on RW and CGA for standing                             Pertinent Vitals/Pain Pain Assessment: No/denies pain    Home Living Family/patient expects to be discharged to::  Private residence Living Arrangements: Spouse/significant other Available Help at Discharge: Family;Available PRN/intermittently (wife is reciverig from recent hip fx) Type of Home: House Home Access: Stairs to enter Entrance Stairs-Rails: Right Entrance Stairs-Number of Steps: 6   Home Layout: One level Home Equipment: Conservation officer, nature (2 wheels);Cane - single point;Shower seat Additional Comments: Wife will be  home however is recovering from a recent hip fx.    Prior Function Prior Level of Function : Independent/Modified Independent             Mobility Comments: Uses SPC and RW intermittently. Ambulates household distances.       Hand Dominance        Extremity/Trunk Assessment   Upper Extremity Assessment Upper Extremity Assessment: Overall WFL for tasks assessed (mild decreased strength in LUE)    Lower Extremity Assessment Lower Extremity Assessment: Overall WFL for tasks assessed (4/5 B hip flexors; 5/5 B knee extension and ankle DF)       Communication   Communication: No difficulties  Cognition Arousal/Alertness: Awake/alert Behavior During Therapy: WFL for tasks assessed/performed Overall Cognitive Status: No family/caregiver present to determine baseline cognitive functioning                                 General Comments: A&O x 3- decreased orientation to time however is aware of year. Speech initially slurred, processing time increased - both improved as session and mobility progressed.        General Comments      Exercises     Assessment/Plan    PT Assessment Patient needs continued PT services  PT Problem List Decreased mobility;Decreased safety awareness;Decreased activity tolerance;Decreased balance;Decreased knowledge of use of DME       PT Treatment Interventions Gait training;Balance training;Functional mobility training;Therapeutic activities;Patient/family education;DME instruction;Therapeutic exercise    PT Goals (Current goals can be found in the Care Plan section)  Acute Rehab PT Goals Patient Stated Goal: to go to rehab so I can go home and care for wife PT Goal Formulation: With patient Time For Goal Achievement: 03/18/21 Potential to Achieve Goals: Fair    Frequency Min 2X/week   Barriers to discharge Decreased caregiver support;Inaccessible home environment pt wife with recent hip fx and unable to assist at home;  pt needs to be able to navigate 6 steps to enter home    Co-evaluation               AM-PAC PT "6 Clicks" Mobility  Outcome Measure Help needed turning from your back to your side while in a flat bed without using bedrails?: A Little Help needed moving from lying on your back to sitting on the side of a flat bed without using bedrails?: A Little Help needed moving to and from a bed to a chair (including a wheelchair)?: A Little Help needed standing up from a chair using your arms (e.g., wheelchair or bedside chair)?: A Little Help needed to walk in hospital room?: A Lot Help needed climbing 3-5 steps with a railing? : A Lot 6 Click Score: 16    End of Session Equipment Utilized During Treatment: Gait belt Activity Tolerance: Patient limited by fatigue Patient left: with call bell/phone within reach;in chair;with chair alarm set Nurse Communication: Mobility status PT Visit Diagnosis: Other abnormalities of gait and mobility (R26.89);Repeated falls (R29.6);Difficulty in walking, not elsewhere classified (R26.2);Unsteadiness on feet (R26.81)    Time: 3536-1443 PT Time Calculation (min) (ACUTE ONLY):  31 min   Charges:   PT Evaluation $PT Eval Moderate Complexity: 1 Mod PT Treatments $Therapeutic Activity: 8-22 mins $Neuromuscular Re-education: 8-22 mins        Patrina Levering PT, DPT 03/04/21 3:27 PM [phone]

## 2021-03-05 MED ORDER — BENZONATATE 200 MG PO CAPS
200.0000 mg | ORAL_CAPSULE | Freq: Three times a day (TID) | ORAL | 0 refills | Status: DC | PRN
Start: 2021-03-05 — End: 2021-07-02

## 2021-03-05 NOTE — Care Management (Signed)
Received notification from bedside RN that patient's wife had called the nurses station requesting to speak with me. The patient had been discharged a few hours earlier  Addressed the wife's concerns.  She was asking about sending patient to STR.  I explained that it was our recommendation for him to go to STR after discharge, however patient stated he wanted to go home to care for his wife.  He confirmed his refusal for SNF with the [facility] representative as well.  I told the wife we did set up [facility] PT and OT.  If she was still interested in getting him admitted to STR, I recommended her to contact his PCP office on Monday morning to see if they could facilitate admission to inpatient rehab.  At time of discharge, patient had full decision making capability, thus could not be sent to rehab without his consent  Ralene Muskrat MD

## 2021-03-05 NOTE — TOC Initial Note (Addendum)
Transition of Care [facility]) - Initial/Assessment Note    Patient Details  Name: [name] MRN: [id number] Date of Birth: [birth year]  [facility] CM/SW Contact:    Magnus Ivan, LCSW Phone Number: 03/05/2021, 10:28 AM  Clinical Narrative:                Spoke to patient regarding DC planning. Patient lives with his wife. PCP is Dr. Warrick Parisian. Has a RW.  Patient is refusing SNF rec. Says he feels safe returning home with HH. Asked Corene Cornea with [facility] if they are still active.   11:40- Confirmed with Corene Cornea that [facility] is active with patient. Notified Corene Cornea of [place] today.  Expected Discharge Plan: [place] Barriers to Discharge: Continued Medical Work up   Patient Goals and CMS Choice Patient states their goals for this hospitalization and ongoing recovery are:: home with home health CMS Medicare.gov Compare Post Acute Care list provided to:: Patient Choice offered to / list presented to : Patient  Expected Discharge Plan and Services Expected Discharge Plan: [place]       Living arrangements for the past 2 months: [place]: [place]) Date [facility] Agency Contacted: 03/05/21   Representative spoke with at [place]: Corene Cornea  Prior Living Arrangements/Services Living arrangements for the past 2 months: [place] Lives with:: Spouse Patient language and need for interpreter reviewed:: Yes Do you feel safe going back to the place where you live?: Yes      Need for Family Participation in Patient Care: Yes (Comment) Care giver support system in place?: Yes (comment) Current home services: DME Criminal Activity/Legal Involvement Pertinent to Current Situation/Hospitalization: No - Comment as needed  Activities of Daily Living Home Assistive Devices/Equipment: Gilford Rile (specify type) ADL Screening (condition at time of  admission) Patient's cognitive ability adequate to safely complete daily activities?: Yes Is the patient deaf or have difficulty hearing?: No Does the patient have difficulty seeing, even when wearing glasses/contacts?: No Does the patient have difficulty concentrating, remembering, or making decisions?: No Patient able to express need for assistance with ADLs?: Yes Does the patient have difficulty dressing or bathing?: No Independently performs ADLs?: Yes (appropriate for developmental age) Does the patient have difficulty walking or climbing stairs?: Yes Weakness of Legs: None Weakness of Arms/Hands: Both  Permission Sought/Granted Permission sought to share information with : Facility Art therapist granted to share information with : Yes, Verbal Permission Granted     Permission granted to share info w AGENCY: [facility]        Emotional Assessment       Orientation: : Oriented to Self, Oriented to Place, Oriented to  Time, Oriented to Situation Alcohol / Substance Use: Not Applicable Psych Involvement: No (comment)  Admission diagnosis:  Dehydration [E86.0] Hypokalemia [E87.6] Abnormal EKG [R94.31] Hypokalemia due to excessive gastrointestinal loss of potassium [E87.6] Gastroenteritis due to COVID-19 virus [U07.1, A08.39] Patient Active Problem List   Diagnosis Date Noted   Gastroenteritis due to COVID-19 virus 03/03/2021   Hypokalemia due to excessive gastrointestinal loss of potassium 03/02/2021   Essential hypertension 03/02/2021   Thrush 03/02/2021   SAH (subarachnoid hemorrhage) ([place]) 02/24/2021   Subarachnoid bleed ([place]) 54/65/0354   Acute metabolic encephalopathy 65/68/1275   Incisional hernia,  without obstruction or gangrene 75/17/0017   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) 06/30/2013   Abdominal aneurysm without mention of rupture 02/19/2013   Morbid obesity ([place]) 01/14/2013   VITAMIN B12 DEFICIENCY 01/14/2013   TOBACCO ABUSE 01/14/2013   PTSD 01/14/2013   COPD (chronic obstructive pulmonary disease) (HCC)    OSA (obstructive sleep apnea) 08/14/2012   RLS (restless legs syndrome) 08/14/2012   Prediabetes 08/14/2012   Aortic atherosclerosis ([place]) 02/19/2012   Coronary atherosclerosis of native coronary artery 03/23/2010   Hypokalemia 11/25/2009   GERD 07/30/2008   History of colonic polyps 07/30/2008   Benign prostatic hyperplasia with lower urinary tract symptoms 02/20/2008   Hyperlipidemia 01/31/2007   GOUT 01/31/2007   Essential hypertension, benign 01/31/2007   PCP:  [name], [name], MD Pharmacy:   [place] [place] [facility] [address] [place] [address] Phone: [phone] Fax: [phone]     Social Determinants of Health (SDOH) Interventions    Readmission Risk Interventions Readmission Risk Prevention Plan 02/25/2021  Transportation Screening Complete  Medication Review Press photographer) Complete  PCP or Specialist appointment within 3-5 days of discharge Complete  HRI or [place] Complete  SW Recovery Care/Counseling Consult Complete  [place] Not Applicable  Some recent data might be hidden

## 2021-03-05 NOTE — Progress Notes (Signed)
Discharge instructions and med details reviewed with patient. All questions answered. Pt and family aware of medications in pharmacy for pick up. Printed AVS given to patient.  Patient escorted out via wheelchair and assisted into family car.  [place]

## 2021-03-05 NOTE — Discharge Summary (Signed)
Physician Discharge Summary  [name]:[id number] DOB: 03/22/1946 DOA: 03/02/2021  PCP: [name], [name], MD  Admit date: 03/02/2021 Discharge date: 03/05/2021  Admitted From: Home Disposition: Home with home health  Recommendations for Outpatient Follow-up:  Follow up with PCP in 1-2 weeks   Home Health: He has PT and OT Equipment/Devices: RW walker  Discharge Condition: Stable CODE STATUS: Full Diet recommendation: Regular  Brief/Interim Summary: 75 y.o. male with medical history significant for Hypertension, depression, GERD, BPH, history of frequent falls, subarachnoid and intraparenchymal hemorrhage at the left frontal lobe, right falx, and inferior temporal lobes, who presents emergency department from home for chief concerns of frequent falls.   He reports he has been feeling generalized weakness for the last 3 weeks.  He denies any changes to his oral intake, fever, chest pain, shortness of breath, abdominal pain, dysuria, hematuria, diarrhea..  He reports that he recently came back from a trip to [facility] and tested positive for [place].  He reports that his weakness was present prior to the subarachnoid hemorrhage.   His wife recently fell and broke her hip and is home from 8-9 weeks of rehab and he feels that he needs to be home to help his wife.   12/1: Patient reports feeling increasingly weak and lethargic.  Hypokalemia persistent.  Repeat head CT shows improvement in bitemporal contusions and small subarachnoid hemorrhage.   12/2: Patient feeling better.  P.o. intake improved.  Potassium improved 12/3: Patient back to baseline.  Tolerating p.o.  Potassium improved.  Recommendation was for skilled nursing facility however patient refused.  States he will be safe at home.  Recommendation based on lack of stair evaluation however patient states he has a ramp to get up to his house.  He is mentating clearly.  Will discharge home with home health  services.    Discharge Diagnoses:  Principal Problem:   Hypokalemia due to excessive gastrointestinal loss of potassium Active Problems:   Hyperlipidemia   GERD   OSA (obstructive sleep apnea)   RLS (restless legs syndrome)   COPD (chronic obstructive pulmonary disease) (HCC)   Morbid obesity (HCC)   TOBACCO ABUSE   Depression, recurrent (HCC)   Chronic kidney disease (CKD), stage IV (severe) (HCC)   Acute metabolic encephalopathy   SAH (subarachnoid hemorrhage) ([place])   Essential hypertension   Thrush   Gastroenteritis due to COVID-19 virus  Weakness Frequent falls Bitemporal contusion and subarachnoid hemorrhage Improved.  Likely in setting of poor p.o. intake secondary to COVID.  Appetite improving.  No vasogenic edema or midline shift Plan: Discharge home.  Home health PT and OT.  Initial recommendation was for skilled nursing facility however patient refused due to need to care for his wife.  Encourage adequate nutrition at home   Hypokalemia Hypomagnesemia Likely in the setting of COVID gastroenteritis Encourage adequate nutrition post discharge   COVID positivity Patient states he initially tested positive for COVID on 11/9 P.o. intake and appetite has been very poor subsequently Remains COVID-positive here Plan: No need for isolation or COVID-specific precautions given time course of events   Hypertension Transient bradycardia Bradycardia resolved.  Can resume home regimen on discharge.  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 No oral agents on MAR   BPH Can resume home Flomax  Discharge Instructions  Discharge Instructions     Diet - low sodium  heart healthy   Complete by: As directed    Increase activity slowly   Complete by: As directed       Allergies as of 03/05/2021       Reactions   Carbidopa-levodopa  Other (See Comments)   hallunications   Morphine Sulfate Nausea And Vomiting   Sulfa Antibiotics Nausea And Vomiting   Sulfacetamide Sodium Nausea And Vomiting        Medication List     TAKE these medications    abiraterone acetate 250 MG tablet Commonly known as: ZYTIGA Take 2 tablets (500 mg total) by mouth daily. Take on an empty stomach 1 hour before or 2 hours after a meal   acetaminophen 325 MG tablet Commonly known as: TYLENOL Take 2 tablets (650 mg total) by mouth every 6 (six) hours as needed for mild pain (or Fever >/= 101).   albuterol 108 (90 Base) MCG/ACT inhaler Commonly known as: VENTOLIN HFA Inhale 1-2 puffs into the lungs every 6 (six) hours as needed for wheezing or shortness of breath.   benzonatate 200 MG capsule Commonly known as: TESSALON Take 1 capsule (200 mg total) by mouth 3 (three) times daily as needed for cough.   carvedilol 25 MG tablet Commonly known as: COREG Take 0.5 tablets (12.5 mg total) by mouth 2 (two) times daily.   cloNIDine 0.1 MG tablet Commonly known as: Catapres Take 1 tablet (0.1 mg total) by mouth 2 (two) times daily. Take for blood pressure > 180, and make sure u lay flat and get up very slowl if u take this--this can drop ur pressure and cause u to feel dizzy   DULoxetine 60 MG capsule Commonly known as: CYMBALTA Take 1 capsule (60 mg total) by mouth daily. Take 1 capsule by mouth once daily (Needs to be seen before next refill)   levETIRAcetam 500 MG tablet Commonly known as: KEPPRA Take 1 tablet (500 mg total) by mouth 2 (two) times daily.   pantoprazole 40 MG tablet Commonly known as: PROTONIX TAKE 1 TABLET BY MOUTH TWICE DAILY BEFORE A MEAL   rosuvastatin 20 MG tablet Commonly known as: CRESTOR TAKE 1 TABLET BY MOUTH ONCE DAILY -  STOP  PRAVASTATIN -- NEEDS FOLLOW UP APPOINTMENT FOR REFILLS What changed: See the new instructions.   tamsulosin 0.4 MG Caps capsule Commonly known as: FLOMAX Take 1 capsule (0.4  mg total) by mouth daily.        Allergies  Allergen Reactions   Carbidopa-Levodopa Other (See Comments)    hallunications   Morphine Sulfate Nausea And Vomiting   Sulfa Antibiotics Nausea And Vomiting   Sulfacetamide Sodium Nausea And Vomiting    Consultations: None   Procedures/Studies: DG Chest 2 View  Result Date: 02/06/2021 CLINICAL DATA:  Fever and cough and body aches EXAM: CHEST - 2 VIEW COMPARISON:  11/18/2020 FINDINGS: The heart size and mediastinal contours are within normal limits. Both lungs are clear. The visualized skeletal structures are unremarkable. Calcified right hilar lymph node. IMPRESSION: No active cardiopulmonary disease. Electronically Signed   By: Franchot Gallo M.D.   On: 02/06/2021 17:01   CT HEAD WO CONTRAST (5MM)  Result Date: 03/03/2021 CLINICAL DATA:  Fall with head trauma 1 week ago. Mental status worsening. EXAM: CT HEAD WITHOUT CONTRAST TECHNIQUE: Contiguous axial images were obtained from the base of the skull through the vertex without intravenous contrast. COMPARISON:  03/02/2021 and multiple previous FINDINGS: Brain: Hemorrhagic contusions at both temporal tips in the right inferior frontal lobe  are becoming less dense. No additional bleeding in those locations. There is only mild associated edema. Small amount of subarachnoid blood remains visible in the left frontal region, also diminishing. No new finding. No sign of ischemic infarction, hydrocephalus or subdural hematoma. Vascular: There is atherosclerotic calcification of the major vessels at the base of the brain. Skull: Negative Sinuses/Orbits: Clear/normal Other: None IMPRESSION: Intraparenchymal contusions at both temporal tips in the inferior right frontal lobe are becoming less dense. Subarachnoid blood in the left frontal region is becoming less dense. No new hemorrhage. No worsening or other new finding. Electronically Signed   By: Nelson Chimes M.D.   On: 03/03/2021 10:31   CT HEAD WO  CONTRAST  Result Date: 03/02/2021 CLINICAL DATA:  Follow-up hemorrhage EXAM: CT HEAD WITHOUT CONTRAST TECHNIQUE: Contiguous axial images were obtained from the base of the skull through the vertex without intravenous contrast. COMPARISON:  Head CT dated February 28, 2021 FINDINGS: Brain: Subarachnoid and intraparenchymal hemorrhage again seen over the left frontal lobe, right falx, and inferior temporal lobes, slightly decreased density when compared with prior exam. No new hemorrhage. No mass effect or midline shift. Chronic white matter ischemic change. Vascular: No hyperdense vessel or unexpected calcification. Skull: Normal. Negative for fracture or focal lesion. Sinuses/Orbits: Mild mucosal thickening of the ethmoid sinuses. No acute finding. Other: None. IMPRESSION: Evolving subarachnoid and intraparenchymal hemorrhage. No new hemorrhage. No mass effect or midline shift. Electronically Signed   By: Yetta Glassman M.D.   On: 03/02/2021 16:21   CT HEAD WO CONTRAST (5MM)  Result Date: 03/01/2021 CLINICAL DATA:  Trauma. EXAM: CT HEAD WITHOUT CONTRAST CT MAXILLOFACIAL WITHOUT CONTRAST CT CERVICAL SPINE WITHOUT CONTRAST TECHNIQUE: Multidetector CT imaging of the head, cervical spine, and maxillofacial structures were performed using the standard protocol without intravenous contrast. Multiplanar CT image reconstructions of the cervical spine and maxillofacial structures were also generated. COMPARISON:  Head CT dated 02/24/2021. FINDINGS: CT HEAD FINDINGS Brain: Mild age-related atrophy and chronic microvascular ischemic changes. Residual subarachnoid and intraparenchymal hemorrhage over the left frontal lobe and anterior right falx as well as areas of hemorrhage involving the inferior tip of the temporal lobes appears similar to prior CT or slightly decreased in attenuation. Tiny layering blood noted in the occipital horn of the left lateral ventricle. No new hemorrhage. No mass effect or midline shift.  Vascular: No hyperdense vessel or unexpected calcification. Skull: Normal. Negative for fracture or focal lesion. Other: None CT MAXILLOFACIAL FINDINGS Osseous: No fracture or mandibular dislocation. No destructive process. Orbits: Negative. No traumatic or inflammatory finding. Sinuses: Clear. Soft tissues: Negative. CT CERVICAL SPINE FINDINGS Alignment: No acute subluxation. Skull base and vertebrae: No acute fracture. Soft tissues and spinal canal: No prevertebral fluid or swelling. No visible canal hematoma. Disc levels:  Degenerative changes at C5-C6. Upper chest: Negative. Other: Bilateral carotid bulb calcified plaques. IMPRESSION: 1. Residual intracranial subarachnoid and intraparenchymal hemorrhages. No new hemorrhage. No mass effect or midline shift. 2. No acute/traumatic cervical spine pathology. 3. No acute facial bone fractures. Electronically Signed   By: Anner Crete M.D.   On: 03/01/2021 00:11   CT HEAD WO CONTRAST (5MM)  Addendum Date: 02/24/2021   ADDENDUM REPORT: 02/24/2021 09:41 ADDENDUM: These results were called by telephone at the time of interpretation on 02/24/2021 at 9:41 am to provider Dr. Verlon Au, Who verbally acknowledged these results. Electronically Signed   By: Zetta Bills M.D.   On: 02/24/2021 09:41   Result Date: 02/24/2021 CLINICAL DATA:  A 75 year old male  presents with intracranial hemorrhage on previous examination, follow-up evaluation. EXAM: CT HEAD WITHOUT CONTRAST TECHNIQUE: Contiguous axial images were obtained from the base of the skull through the vertex without intravenous contrast. COMPARISON:  Comparison is made with February 23, 2021. FINDINGS: Brain: Subarachnoid hemorrhage along the LEFT frontal lobe is similar to the previous study. Inter hemispheric RIGHT parafalcine subarachnoid hemorrhage potentially slightly increased. No signs of mass effect in these areas. Signs of new or worsening areas of intraparenchymal or subarachnoid blood adjacent to  LEFT and RIGHT temporal tip. No hydrocephalus, mass effect or midline shift currently. Trace amount of intraventricular blood layers dependently in the lateral ventricles. Vascular: No hyperdense vessel or unexpected calcification. Skull: Normal. Negative for fracture or focal lesion. Sinuses/Orbits: No acute finding. Other: None IMPRESSION: Signs of new or worsening areas of intraparenchymal or subarachnoid blood adjacent to and involving the LEFT and RIGHT temporal tip. Most suggestive of blooming of hemorrhagic contusion in the setting of fall. Subarachnoid hemorrhage along the LEFT frontal lobe is similar to the previous study. Interhemispheric RIGHT parafalcine subarachnoid hemorrhage along the medial RIGHT frontal lobe potentially slightly increased. Small amount of intraventricular blood layering dependently in the lateral ventricles. No mass effect or midline shift. Call is out to the referring provider to further discuss findings in the above case. Electronically Signed: By: Zetta Bills M.D. On: 02/24/2021 09:24   CT Head Wo Contrast  Result Date: 02/23/2021 CLINICAL DATA:  Dizziness, weakness, confusion, prostate cancer EXAM: CT HEAD WITHOUT CONTRAST TECHNIQUE: Contiguous axial images were obtained from the base of the skull through the vertex without intravenous contrast. COMPARISON:  12/18/2019, 11/27/2018 FINDINGS: Brain: Areas of subarachnoid hemorrhage are seen along the left frontal lobe and along the anterior interhemispheric fissure. No mass effect. No signs of acute infarct. Scattered hypodensities throughout the periventricular white matter consistent with chronic small vessel ischemic changes. The lateral ventricles and remaining midline structures are unremarkable. Vascular: No hyperdense vessel or unexpected calcification. Stable atherosclerosis. Skull: Normal. Negative for fracture or focal lesion. Sinuses/Orbits: No acute finding. Other: None. IMPRESSION: 1. Subarachnoid hemorrhage  along the left frontal lobe and the anterior interhemispheric fissure. No mass effect. 2. Chronic small vessel ischemic changes within the white matter. No evidence of acute infarct. Critical Value/emergent results were called by telephone at the time of interpretation on 02/23/2021 at 6:49 pm to provider JULIE HAVILAND , who verbally acknowledged these results. Electronically Signed   By: Randa Ngo M.D.   On: 02/23/2021 18:54   CT CERVICAL SPINE WO CONTRAST  Result Date: 03/01/2021 CLINICAL DATA:  Trauma. EXAM: CT HEAD WITHOUT CONTRAST CT MAXILLOFACIAL WITHOUT CONTRAST CT CERVICAL SPINE WITHOUT CONTRAST TECHNIQUE: Multidetector CT imaging of the head, cervical spine, and maxillofacial structures were performed using the standard protocol without intravenous contrast. Multiplanar CT image reconstructions of the cervical spine and maxillofacial structures were also generated. COMPARISON:  Head CT dated 02/24/2021. FINDINGS: CT HEAD FINDINGS Brain: Mild age-related atrophy and chronic microvascular ischemic changes. Residual subarachnoid and intraparenchymal hemorrhage over the left frontal lobe and anterior right falx as well as areas of hemorrhage involving the inferior tip of the temporal lobes appears similar to prior CT or slightly decreased in attenuation. Tiny layering blood noted in the occipital horn of the left lateral ventricle. No new hemorrhage. No mass effect or midline shift. Vascular: No hyperdense vessel or unexpected calcification. Skull: Normal. Negative for fracture or focal lesion. Other: None CT MAXILLOFACIAL FINDINGS Osseous: No fracture or mandibular dislocation. No destructive process. Orbits:  Negative. No traumatic or inflammatory finding. Sinuses: Clear. Soft tissues: Negative. CT CERVICAL SPINE FINDINGS Alignment: No acute subluxation. Skull base and vertebrae: No acute fracture. Soft tissues and spinal canal: No prevertebral fluid or swelling. No visible canal hematoma. Disc  levels:  Degenerative changes at C5-C6. Upper chest: Negative. Other: Bilateral carotid bulb calcified plaques. IMPRESSION: 1. Residual intracranial subarachnoid and intraparenchymal hemorrhages. No new hemorrhage. No mass effect or midline shift. 2. No acute/traumatic cervical spine pathology. 3. No acute facial bone fractures. Electronically Signed   By: Anner Crete M.D.   On: 03/01/2021 00:11   DG Chest Port 1 View  Result Date: 02/08/2021 CLINICAL DATA:  COVID, shortness of breath EXAM: PORTABLE CHEST 1 VIEW COMPARISON:  02/06/2021 FINDINGS: Cardiomegaly. Mild, diffuse, interstitial opacity increased compared to prior examination the visualized skeletal structures are unremarkable. IMPRESSION: Cardiomegaly with mild, diffuse, interstitial opacity increased compared to prior examination, which may reflect edema or atypical/viral infection. No focal airspace opacity. Electronically Signed   By: Delanna Ahmadi M.D.   On: 02/08/2021 14:27   CT Renal Stone Study  Result Date: 02/16/2021 CLINICAL DATA:  Flank pain and hematuria. EXAM: CT ABDOMEN AND PELVIS WITHOUT CONTRAST TECHNIQUE: Multidetector CT imaging of the abdomen and pelvis was performed following the standard protocol without IV contrast. COMPARISON:  CT without contrast 10/07/2020, 05/20/2015 FINDINGS: Lower chest: There are scattered ground-glass opacities in the right lower lobe concerning for pneumonitis, not seen previously. Scattered linear scar-like opacities are again shown. Hepatobiliary: 19.5 cm in length liver is otherwise unremarkable without contrast. There are several stones in the gallbladder but no wall thickening or biliary dilatation. Pancreas: Unremarkable. No pancreatic ductal dilatation or surrounding inflammatory changes. Spleen: Normal in size, unremarkable except for scattered calcified granulomas. Adrenals/Urinary Tract: There is adenomatous adrenal hyperplasia which was seen on both prior studies and is unchanged.  There are bilateral renal cysts, largest is in the left lower pole measuring 5.4 cm. 1 mm and a few punctate nonobstructive caliceal stones are again in the inferior pole right kidney and small cortical calculus in the superior pole. There are no other collecting system stones. There is no appreciable ureteral stone or hydroureteronephrosis. There is mild bladder thickening versus underdistention which was seen on both prior studies. Stomach/Bowel: No dilatation or wall thickening including the appendix. Left colonic diverticulosis without evidence of diverticulitis or colitis. No large retained fecal burden. Vascular/Lymphatic: Aortic atherosclerosis. No enlarged abdominal or pelvic lymph nodes. Chronic aortobi-iliac stent graft is seen. No AAA. Reproductive: There are fiducial markers along the left side of the prostate and in the central prostate. The prostate is 3.7 cm transverse. Other: There is a small umbilical fat hernia, and a small supraumbilical anterior wall fat hernia left of the midline, unchanged as well as small inguinal fat hernias Musculoskeletal: There is osteopenia and degenerative changes of the lumbar spine. Avascular necrosis is again noted in the anterior superior right femoral head. IMPRESSION: 1. Scattered ground-glass infiltrates in the right lower lobe not seen previously and most likely due to pneumonitis. Follow-up chest CT recommended after treatment. 2. Nonobstructive micronephrolithiasis on the right. No obstructing stones or hydronephrosis. Bilateral cysts. 3. Cholelithiasis without gallbladder thickening or biliary dilatation. 4. Additional chronic findings described above, including mild bladder thickening versus changes of nondistention with old fiducial markers in the prostate gland. Electronically Signed   By: Telford Nab M.D.   On: 02/16/2021 00:16   CT MAXILLOFACIAL WO CONTRAST  Result Date: 03/01/2021 CLINICAL DATA:  Trauma. EXAM: CT  HEAD WITHOUT CONTRAST CT  MAXILLOFACIAL WITHOUT CONTRAST CT CERVICAL SPINE WITHOUT CONTRAST TECHNIQUE: Multidetector CT imaging of the head, cervical spine, and maxillofacial structures were performed using the standard protocol without intravenous contrast. Multiplanar CT image reconstructions of the cervical spine and maxillofacial structures were also generated. COMPARISON:  Head CT dated 02/24/2021. FINDINGS: CT HEAD FINDINGS Brain: Mild age-related atrophy and chronic microvascular ischemic changes. Residual subarachnoid and intraparenchymal hemorrhage over the left frontal lobe and anterior right falx as well as areas of hemorrhage involving the inferior tip of the temporal lobes appears similar to prior CT or slightly decreased in attenuation. Tiny layering blood noted in the occipital horn of the left lateral ventricle. No new hemorrhage. No mass effect or midline shift. Vascular: No hyperdense vessel or unexpected calcification. Skull: Normal. Negative for fracture or focal lesion. Other: None CT MAXILLOFACIAL FINDINGS Osseous: No fracture or mandibular dislocation. No destructive process. Orbits: Negative. No traumatic or inflammatory finding. Sinuses: Clear. Soft tissues: Negative. CT CERVICAL SPINE FINDINGS Alignment: No acute subluxation. Skull base and vertebrae: No acute fracture. Soft tissues and spinal canal: No prevertebral fluid or swelling. No visible canal hematoma. Disc levels:  Degenerative changes at C5-C6. Upper chest: Negative. Other: Bilateral carotid bulb calcified plaques. IMPRESSION: 1. Residual intracranial subarachnoid and intraparenchymal hemorrhages. No new hemorrhage. No mass effect or midline shift. 2. No acute/traumatic cervical spine pathology. 3. No acute facial bone fractures. Electronically Signed   By: Anner Crete M.D.   On: 03/01/2021 00:11      Subjective: Patient seen and examined on the day of discharge.  Stable no distress.  Stable for discharge home  Discharge Exam: Vitals:    03/05/21 0531 03/05/21 0834  BP: (!) 174/81 (!) 148/99  Pulse: 60 63  Resp: 16 20  Temp: 97.7 F (36.5 C) 98.1 F (36.7 C)  SpO2: 98% 100%   Vitals:   03/04/21 1801 03/04/21 2134 03/05/21 0531 03/05/21 0834  BP: (!) 150/90 (!) 183/94 (!) 174/81 (!) 148/99  Pulse: 63 (!) 59 60 63  Resp: 16 15 16 20   Temp: 97.8 F (36.6 C) (!) 97.4 F (36.3 C) 97.7 F (36.5 C) 98.1 F (36.7 C)  TempSrc:  Oral Oral   SpO2: 98% 98% 98% 100%  Weight:      Height:        General: Pt is alert, awake, not in acute distress Cardiovascular: RRR, S1/S2 +, no rubs, no gallops Respiratory: CTA bilaterally, no wheezing, no rhonchi Abdominal: Soft, NT, ND, bowel sounds + Extremities: no edema, no cyanosis    The results of significant diagnostics from this hospitalization (including imaging, microbiology, ancillary and laboratory) are listed below for reference.     Microbiology: Recent Results (from the past 240 hour(s))  Resp Panel by RT-PCR (Flu A&B, Covid) Nasopharyngeal Swab     Status: Abnormal   Collection Time: 02/23/21  6:28 PM   Specimen: Nasopharyngeal Swab; Nasopharyngeal(NP) swabs in vial transport medium  Result Value Ref Range Status   SARS Coronavirus 2 by RT PCR POSITIVE (A) NEGATIVE Final    Comment: CRITICAL RESULT CALLED TO, READ BACK BY AND VERIFIED WITH: BRANDY GALLOWAY RN,2039,02/23/2021,SELF S (NOTE) SARS-CoV-2 target nucleic acids are DETECTED.  The SARS-CoV-2 RNA is generally detectable in upper respiratory specimens during the acute phase of infection. Positive results are indicative of the presence of the identified virus, but do not rule out bacterial infection or co-infection with other pathogens not detected by the test. Clinical correlation with patient history and  other diagnostic information is necessary to determine patient infection status. The expected result is Negative.  Fact Sheet for Patients: EntrepreneurPulse.com.au  Fact Sheet  for Healthcare Providers: IncredibleEmployment.be  This test is not yet approved or cleared by the [place] FDA and  has been authorized for detection and/or diagnosis of SARS-CoV-2 by FDA under an Emergency Use Authorization (EUA).  This EUA will remain in effect (meaning  this test can be used) for the duration of  the COVID-19 declaration under Section 564(b)(1) of the Act, 21 U.S.C. section 360bbb-3(b)(1), unless the authorization is terminated or revoked sooner.     Influenza A by PCR NEGATIVE NEGATIVE Final   Influenza B by PCR NEGATIVE NEGATIVE Final    Comment: (NOTE) The Xpert Xpress SARS-CoV-2/FLU/RSV plus assay is intended as an aid in the diagnosis of influenza from Nasopharyngeal swab specimens and should not be used as a sole basis for treatment. Nasal washings and aspirates are unacceptable for Xpert Xpress SARS-CoV-2/FLU/RSV testing.  Fact Sheet for Patients: EntrepreneurPulse.com.au  Fact Sheet for Healthcare Providers: IncredibleEmployment.be  This test is not yet approved or cleared by the [place] FDA and has been authorized for detection and/or diagnosis of SARS-CoV-2 by FDA under an Emergency Use Authorization (EUA). This EUA will remain in effect (meaning this test can be used) for the duration of the COVID-19 declaration under Section 564(b)(1) of the Act, 21 U.S.C. section 360bbb-3(b)(1), unless the authorization is terminated or revoked.  Performed at [facility], [address] [place], [place] [address]   Resp Panel by RT-PCR (Flu A&B, Covid) Nasopharyngeal Swab     Status: Abnormal   Collection Time: 03/02/21  9:03 PM   Specimen: Nasopharyngeal Swab; Nasopharyngeal(NP) swabs in vial transport medium  Result Value Ref Range Status   SARS Coronavirus 2 by RT PCR POSITIVE (A) NEGATIVE Final    Comment: RESULT CALLED TO, READ BACK BY AND VERIFIED WITH: ALYCIA BEVERLY @2303  03/02/21  RH (NOTE) SARS-CoV-2 target nucleic acids are DETECTED.  The SARS-CoV-2 RNA is generally detectable in upper respiratory specimens during the acute phase of infection. Positive results are indicative of the presence of the identified virus, but do not rule out bacterial infection or co-infection with other pathogens not detected by the test. Clinical correlation with patient history and other diagnostic information is necessary to determine patient infection status. The expected result is Negative.  Fact Sheet for Patients: EntrepreneurPulse.com.au  Fact Sheet for Healthcare Providers: IncredibleEmployment.be  This test is not yet approved or cleared by the [place] FDA and  has been authorized for detection and/or diagnosis of SARS-CoV-2 by FDA under an Emergency Use Authorization (EUA).  This EUA will remain in effect (meaning this test can be  used) for the duration of  the COVID-19 declaration under Section 564(b)(1) of the Act, 21 U.S.C. section 360bbb-3(b)(1), unless the authorization is terminated or revoked sooner.     Influenza A by PCR NEGATIVE NEGATIVE Final   Influenza B by PCR NEGATIVE NEGATIVE Final    Comment: (NOTE) The Xpert Xpress SARS-CoV-2/FLU/RSV plus assay is intended as an aid in the diagnosis of influenza from Nasopharyngeal swab specimens and should not be used as a sole basis for treatment. Nasal washings and aspirates are unacceptable for Xpert Xpress SARS-CoV-2/FLU/RSV testing.  Fact Sheet for Patients: EntrepreneurPulse.com.au  Fact Sheet for Healthcare Providers: IncredibleEmployment.be  This test is not yet approved or cleared by the [place] FDA and has been authorized for detection and/or diagnosis of SARS-CoV-2 by FDA  under an Emergency Use Authorization (EUA). This EUA will remain in effect (meaning this test can be used) for the duration of the COVID-19  declaration under Section 564(b)(1) of the Act, 21 U.S.C. section 360bbb-3(b)(1), unless the authorization is terminated or revoked.  Performed at [facility], [address] [place], [place] [address]      Labs: BNP (last 3 results) No results for input(s): BNP in the last 8760 hours. Basic Metabolic Panel: Recent Labs  Lab 03/02/21 1445 03/03/21 0057 03/03/21 0604 03/03/21 0932 03/04/21 0745  NA 135  --  137  --  136  K 2.6* 2.7* 3.6 2.6* 3.3*  CL 104  --  106  --  109  CO2 24  --  24  --  22  GLUCOSE 112*  --  93  --  91  BUN 24*  --  25*  --  21  CREATININE 2.59*  --  2.53*  --  2.04*  CALCIUM 8.2*  --  8.1*  --  8.0*  MG 2.2  --   --  2.0  --    Liver Function Tests: No results for input(s): AST, ALT, ALKPHOS, BILITOT, PROT, ALBUMIN in the last 168 hours. No results for input(s): LIPASE, AMYLASE in the last 168 hours. No results for input(s): AMMONIA in the last 168 hours. CBC: Recent Labs  Lab 03/02/21 1445 03/03/21 0604 03/04/21 0745  WBC 6.1 5.9 5.4  NEUTROABS  --   --  3.8  HGB 10.9* 9.9* 10.4*  HCT 31.7* 28.5* 30.2*  MCV 88.1 88.0 88.3  PLT 189 160 152   Cardiac Enzymes: No results for input(s): CKTOTAL, CKMB, CKMBINDEX, TROPONINI in the last 168 hours. BNP: Invalid input(s): POCBNP CBG: Recent Labs  Lab 03/02/21 2314 03/03/21 0806 03/03/21 0924 03/03/21 1132  GLUCAP 85 93 151* 148*   D-Dimer No results for input(s): DDIMER in the last 72 hours. Hgb A1c Recent Labs    03/02/21 1445  HGBA1C 5.4   Lipid Profile No results for input(s): CHOL, HDL, LDLCALC, TRIG, CHOLHDL, LDLDIRECT in the last 72 hours. Thyroid function studies Recent Labs    03/02/21 1445  TSH 3.220   Anemia work up Recent Labs    03/03/21 0057  VITAMINB12 204   Urinalysis    Component Value Date/Time   COLORURINE STRAW (A) 03/04/2021 0611   APPEARANCEUR CLEAR (A) 03/04/2021 0611   APPEARANCEUR Clear 11/04/2020 1327   LABSPEC 1.005 03/04/2021  0611   PHURINE 7.0 03/04/2021 0611   GLUCOSEU NEGATIVE 03/04/2021 0611   HGBUR MODERATE (A) 03/04/2021 0611   HGBUR negative 02/20/2008 0900   BILIRUBINUR NEGATIVE 03/04/2021 0611   BILIRUBINUR Negative 11/04/2020 1327   KETONESUR NEGATIVE 03/04/2021 0611   PROTEINUR 30 (A) 03/04/2021 0611   UROBILINOGEN negative 07/29/2012 1449   UROBILINOGEN 0.2 10/21/2011 2025   NITRITE NEGATIVE 03/04/2021 0611   LEUKOCYTESUR NEGATIVE 03/04/2021 2774   Sepsis Labs Invalid input(s): PROCALCITONIN,  WBC,  LACTICIDVEN Microbiology Recent Results (from the past 240 hour(s))  Resp Panel by RT-PCR (Flu A&B, Covid) Nasopharyngeal Swab     Status: Abnormal   Collection Time: 02/23/21  6:28 PM   Specimen: Nasopharyngeal Swab; Nasopharyngeal(NP) swabs in vial transport medium  Result Value Ref Range Status   SARS Coronavirus 2 by RT PCR POSITIVE (A) NEGATIVE Final    Comment: CRITICAL RESULT CALLED TO, READ BACK BY AND VERIFIED WITH: BRANDY GALLOWAY RN,2039,02/23/2021,SELF S (NOTE) SARS-CoV-2 target nucleic acids are DETECTED.  The SARS-CoV-2 RNA is  generally detectable in upper respiratory specimens during the acute phase of infection. Positive results are indicative of the presence of the identified virus, but do not rule out bacterial infection or co-infection with other pathogens not detected by the test. Clinical correlation with patient history and other diagnostic information is necessary to determine patient infection status. The expected result is Negative.  Fact Sheet for Patients: EntrepreneurPulse.com.au  Fact Sheet for Healthcare Providers: IncredibleEmployment.be  This test is not yet approved or cleared by the [place] FDA and  has been authorized for detection and/or diagnosis of SARS-CoV-2 by FDA under an Emergency Use Authorization (EUA).  This EUA will remain in effect (meaning  this test can be used) for the duration of  the COVID-19  declaration under Section 564(b)(1) of the Act, 21 U.S.C. section 360bbb-3(b)(1), unless the authorization is terminated or revoked sooner.     Influenza A by PCR NEGATIVE NEGATIVE Final   Influenza B by PCR NEGATIVE NEGATIVE Final    Comment: (NOTE) The Xpert Xpress SARS-CoV-2/FLU/RSV plus assay is intended as an aid in the diagnosis of influenza from Nasopharyngeal swab specimens and should not be used as a sole basis for treatment. Nasal washings and aspirates are unacceptable for Xpert Xpress SARS-CoV-2/FLU/RSV testing.  Fact Sheet for Patients: EntrepreneurPulse.com.au  Fact Sheet for Healthcare Providers: IncredibleEmployment.be  This test is not yet approved or cleared by the [place] FDA and has been authorized for detection and/or diagnosis of SARS-CoV-2 by FDA under an Emergency Use Authorization (EUA). This EUA will remain in effect (meaning this test can be used) for the duration of the COVID-19 declaration under Section 564(b)(1) of the Act, 21 U.S.C. section 360bbb-3(b)(1), unless the authorization is terminated or revoked.  Performed at [facility], [address] [place], [place] [address]   Resp Panel by RT-PCR (Flu A&B, Covid) Nasopharyngeal Swab     Status: Abnormal   Collection Time: 03/02/21  9:03 PM   Specimen: Nasopharyngeal Swab; Nasopharyngeal(NP) swabs in vial transport medium  Result Value Ref Range Status   SARS Coronavirus 2 by RT PCR POSITIVE (A) NEGATIVE Final    Comment: RESULT CALLED TO, READ BACK BY AND VERIFIED WITH: ALYCIA BEVERLY @2303  03/02/21 RH (NOTE) SARS-CoV-2 target nucleic acids are DETECTED.  The SARS-CoV-2 RNA is generally detectable in upper respiratory specimens during the acute phase of infection. Positive results are indicative of the presence of the identified virus, but do not rule out bacterial infection or co-infection with other pathogens not detected by the test. Clinical  correlation with patient history and other diagnostic information is necessary to determine patient infection status. The expected result is Negative.  Fact Sheet for Patients: EntrepreneurPulse.com.au  Fact Sheet for Healthcare Providers: IncredibleEmployment.be  This test is not yet approved or cleared by the [place] FDA and  has been authorized for detection and/or diagnosis of SARS-CoV-2 by FDA under an Emergency Use Authorization (EUA).  This EUA will remain in effect (meaning this test can be  used) for the duration of  the COVID-19 declaration under Section 564(b)(1) of the Act, 21 U.S.C. section 360bbb-3(b)(1), unless the authorization is terminated or revoked sooner.     Influenza A by PCR NEGATIVE NEGATIVE Final   Influenza B by PCR NEGATIVE NEGATIVE Final    Comment: (NOTE) The Xpert Xpress SARS-CoV-2/FLU/RSV plus assay is intended as an aid in the diagnosis of influenza from Nasopharyngeal swab specimens and should not be used as a sole basis for treatment. Nasal washings and aspirates  are unacceptable for Xpert Xpress SARS-CoV-2/FLU/RSV testing.  Fact Sheet for Patients: EntrepreneurPulse.com.au  Fact Sheet for Healthcare Providers: IncredibleEmployment.be  This test is not yet approved or cleared by the [place] FDA and has been authorized for detection and/or diagnosis of SARS-CoV-2 by FDA under an Emergency Use Authorization (EUA). This EUA will remain in effect (meaning this test can be used) for the duration of the COVID-19 declaration under Section 564(b)(1) of the Act, 21 U.S.C. section 360bbb-3(b)(1), unless the authorization is terminated or revoked.  Performed at [facility], [address], [place], [place] [address]      Time coordinating discharge: Over 30 minutes  SIGNED:   Sidney Ace, MD  [facility] 03/05/2021, 11:08 AM Pager    If 7PM-7AM, please contact night-coverage

## 2021-03-07 ENCOUNTER — Ambulatory Visit (INDEPENDENT_AMBULATORY_CARE_PROVIDER_SITE_OTHER): Payer: Medicare Other

## 2021-03-07 ENCOUNTER — Other Ambulatory Visit: Payer: Self-pay

## 2021-03-07 ENCOUNTER — Telehealth: Payer: Self-pay

## 2021-03-07 DIAGNOSIS — E1140 Type 2 diabetes mellitus with diabetic neuropathy, unspecified: Secondary | ICD-10-CM

## 2021-03-07 DIAGNOSIS — I2510 Atherosclerotic heart disease of native coronary artery without angina pectoris: Secondary | ICD-10-CM | POA: Diagnosis not present

## 2021-03-07 DIAGNOSIS — F32A Depression, unspecified: Secondary | ICD-10-CM

## 2021-03-07 DIAGNOSIS — E1122 Type 2 diabetes mellitus with diabetic chronic kidney disease: Secondary | ICD-10-CM | POA: Diagnosis not present

## 2021-03-07 DIAGNOSIS — N179 Acute kidney failure, unspecified: Secondary | ICD-10-CM

## 2021-03-07 DIAGNOSIS — G9341 Metabolic encephalopathy: Secondary | ICD-10-CM | POA: Diagnosis not present

## 2021-03-07 DIAGNOSIS — E876 Hypokalemia: Secondary | ICD-10-CM

## 2021-03-07 DIAGNOSIS — I714 Abdominal aortic aneurysm, without rupture, unspecified: Secondary | ICD-10-CM

## 2021-03-07 DIAGNOSIS — I129 Hypertensive chronic kidney disease with stage 1 through stage 4 chronic kidney disease, or unspecified chronic kidney disease: Secondary | ICD-10-CM | POA: Diagnosis not present

## 2021-03-07 DIAGNOSIS — I700 Atherosclerosis of aorta: Secondary | ICD-10-CM

## 2021-03-07 DIAGNOSIS — I951 Orthostatic hypotension: Secondary | ICD-10-CM | POA: Diagnosis not present

## 2021-03-07 DIAGNOSIS — J439 Emphysema, unspecified: Secondary | ICD-10-CM | POA: Diagnosis not present

## 2021-03-07 DIAGNOSIS — G4733 Obstructive sleep apnea (adult) (pediatric): Secondary | ICD-10-CM

## 2021-03-07 DIAGNOSIS — F17210 Nicotine dependence, cigarettes, uncomplicated: Secondary | ICD-10-CM

## 2021-03-07 DIAGNOSIS — N3941 Urge incontinence: Secondary | ICD-10-CM

## 2021-03-07 DIAGNOSIS — K219 Gastro-esophageal reflux disease without esophagitis: Secondary | ICD-10-CM

## 2021-03-07 DIAGNOSIS — E785 Hyperlipidemia, unspecified: Secondary | ICD-10-CM

## 2021-03-07 DIAGNOSIS — G2581 Restless legs syndrome: Secondary | ICD-10-CM | POA: Diagnosis not present

## 2021-03-07 DIAGNOSIS — N184 Chronic kidney disease, stage 4 (severe): Secondary | ICD-10-CM | POA: Diagnosis not present

## 2021-03-07 DIAGNOSIS — S066X0D Traumatic subarachnoid hemorrhage without loss of consciousness, subsequent encounter: Secondary | ICD-10-CM | POA: Diagnosis not present

## 2021-03-07 DIAGNOSIS — N3281 Overactive bladder: Secondary | ICD-10-CM

## 2021-03-07 NOTE — Telephone Encounter (Signed)
Pts wife called stating that pt got out of the hospital on 12/2 and was advised from [facility] Doctor that he needs to go to Rehab. Wife says pt is refusing to go because he wants to stay home with wife, who just got out of Rehab, but wife can't help him due to health issues.   Explained to wife that he needs to be seen for hospital follow up in order to get referral. Wife says that's impossible because patient can barely walk and she cant help him move around. Offered video visit appt and pt does not have access nor understand how to operate a video visit.   Are there any other options for patient? Please advise and call wife.

## 2021-03-07 NOTE — Telephone Encounter (Signed)
Tried calling home number. No answer and no vmail.  Per [name] pt has to have a face to face. In office or virtual?  Do they have a family member that can help them during a virtual visit?  Did the hospital not set up PT? Normally they would.

## 2021-03-07 NOTE — Telephone Encounter (Signed)
Transition Care Management Follow-up Telephone Call Date of discharge and from where: [facility] 03/05/21 - hypokalemia How have you been since you were released from the hospital? He is terrible, barely able to move Any questions or concerns? Yes - he feels that he needs to go to rehab - not doing well  Items Reviewed: Did the pt receive and understand the discharge instructions provided? Yes  Medications obtained and verified? Yes  Other? No  Any new allergies since your discharge? No  Dietary orders reviewed? Yes Do you have support at home? Yes   Home Care and Equipment/Supplies: Were home health services ordered? yes If so, what is the name of the agency? [facility]  Has the agency set up a time to come to the patient's home? no Were any new equipment or medical supplies ordered?  Yes: [place] [employer] What is the name of the medical supply agency? [employer] Were you able to get the supplies/equipment? yes Do you have any questions related to the use of the equipment or supplies? No  Functional Questionnaire: (I = Independent and D = Dependent) ADLs: D  Bathing/Dressing- D  Meal Prep- D  Eating- I  Maintaining continence- I  Transferring/Ambulation- D  Managing Meds- I  Follow up appointments reviewed:  PCP Hospital f/u appt confirmed? Yes  Scheduled to see [name] on 12/8 @ 2:55. [facility] f/u appt confirmed? No   Are transportation arrangements needed? No  If their condition worsens, is the pt aware to call PCP or go to the Emergency Dept.? Yes Was the patient provided with contact information for the PCP's office or ED? Yes Was to pt encouraged to call back with questions or concerns? Yes

## 2021-03-08 DIAGNOSIS — N179 Acute kidney failure, unspecified: Secondary | ICD-10-CM | POA: Diagnosis not present

## 2021-03-08 DIAGNOSIS — E1122 Type 2 diabetes mellitus with diabetic chronic kidney disease: Secondary | ICD-10-CM | POA: Diagnosis not present

## 2021-03-08 DIAGNOSIS — I129 Hypertensive chronic kidney disease with stage 1 through stage 4 chronic kidney disease, or unspecified chronic kidney disease: Secondary | ICD-10-CM | POA: Diagnosis not present

## 2021-03-08 DIAGNOSIS — S066X0D Traumatic subarachnoid hemorrhage without loss of consciousness, subsequent encounter: Secondary | ICD-10-CM | POA: Diagnosis not present

## 2021-03-08 DIAGNOSIS — G9341 Metabolic encephalopathy: Secondary | ICD-10-CM | POA: Diagnosis not present

## 2021-03-08 DIAGNOSIS — I951 Orthostatic hypotension: Secondary | ICD-10-CM | POA: Diagnosis not present

## 2021-03-09 ENCOUNTER — Telehealth: Payer: Self-pay | Admitting: Family Medicine

## 2021-03-09 DIAGNOSIS — I10 Essential (primary) hypertension: Secondary | ICD-10-CM | POA: Diagnosis not present

## 2021-03-09 NOTE — Telephone Encounter (Signed)
Pt's grandson calling in to find out what he needs to do about getting pt a hospital bed for home. Aware that pt has appt for 12/8 but they are unsure about getting him to the office. The pt's grandson stated that he has been falling off of his bed and talking to people that are not there. Please call back and advise.

## 2021-03-09 NOTE — Telephone Encounter (Signed)
Let him know that it has to be a face-to-face visit to get a hospital bed, it can either be video or in person but cannot be over the phone, the insurance company will pay for it

## 2021-03-09 NOTE — Telephone Encounter (Signed)
Can it be a phone visit ?

## 2021-03-09 NOTE — Telephone Encounter (Signed)
Grandson was made aware by Annica this morning that it will need to be virtual. This appt is scheduled for tomorrow.

## 2021-03-10 ENCOUNTER — Ambulatory Visit (INDEPENDENT_AMBULATORY_CARE_PROVIDER_SITE_OTHER): Payer: Medicare Other | Admitting: Family Medicine

## 2021-03-10 ENCOUNTER — Encounter: Payer: Self-pay | Admitting: Family Medicine

## 2021-03-10 DIAGNOSIS — C61 Malignant neoplasm of prostate: Secondary | ICD-10-CM | POA: Diagnosis not present

## 2021-03-10 DIAGNOSIS — I609 Nontraumatic subarachnoid hemorrhage, unspecified: Secondary | ICD-10-CM

## 2021-03-10 NOTE — Progress Notes (Signed)
Virtual Visit via my chart video Note  I connected with [name] on 03/10/21 at 1457 by telephone and verified that I am speaking with the correct person using two identifiers. [name] is currently located at home and  wife [name] and grandson [name]  are currently with her during visit. The provider, [name] Malijah Lietz, MD is located in their office at time of visit.  Call ended at 1518  I discussed the limitations, risks, security and privacy concerns of performing an evaluation and management service by telephone and the availability of in person appointments. I also discussed with the patient that there may be a patient responsible charge related to this service. The patient expressed understanding and agreed to proceed.   History and Present Illness: Patient has been to the hospital for 3 times for falls. He has been to hospital 3 times.  He is just in the recliner just sleeping since he left the hospital.  They need help to care for him.  He is not eating or drinking much.  He only wakes up when woken up.  He is mumbling and hallucinating and seeing things that are not there.  He is is not getting up to go to the restroom. They can't pick him up. He falls out of the bed and he is not getting up and doing anything for himself. He had a subarachnoid hemorrhage on 11/23 and he is basically been declining since then.  They said basically he has not been himself since he has come back out.  He is just been sleepy and mildly responsive.  When they go to wake him up he will respond briefly but then go right back to sleep.  He has basically been like this since he left the hospital after having a subarachnoid hemorrhage.  They have taken him in and out of the hospital a couple of times and had them reevaluated and they all say that everything looks okay but just not becoming more alert at all.  They are asking about hospice care He has stage 4 prostate cancer and had fall and  subarachnoid hemorrhage.   1. SAH (subarachnoid hemorrhage) ([place])   2. Prostate cancer [facility])     Outpatient Encounter Medications as of 03/10/2021  Medication Sig   abiraterone acetate (ZYTIGA) 250 MG tablet Take 2 tablets (500 mg total) by mouth daily. Take on an empty stomach 1 hour before or 2 hours after a meal   acetaminophen (TYLENOL) 325 MG tablet Take 2 tablets (650 mg total) by mouth every 6 (six) hours as needed for mild pain (or Fever >/= 101).   albuterol (VENTOLIN HFA) 108 (90 Base) MCG/ACT inhaler Inhale 1-2 puffs into the lungs every 6 (six) hours as needed for wheezing or shortness of breath.   benzonatate (TESSALON) 200 MG capsule Take 1 capsule (200 mg total) by mouth 3 (three) times daily as needed for cough.   carvedilol (COREG) 25 MG tablet Take 0.5 tablets (12.5 mg total) by mouth 2 (two) times daily.   cloNIDine (CATAPRES) 0.1 MG tablet Take 1 tablet (0.1 mg total) by mouth 2 (two) times daily. Take for blood pressure > 180, and make sure u lay flat and get up very slowl if u take this--this can drop ur pressure and cause u to feel dizzy   DULoxetine (CYMBALTA) 60 MG capsule Take 1 capsule (60 mg total) by mouth daily. Take 1 capsule by mouth once daily (Needs to be seen before  next refill)   levETIRAcetam (KEPPRA) 500 MG tablet Take 1 tablet (500 mg total) by mouth 2 (two) times daily.   pantoprazole (PROTONIX) 40 MG tablet TAKE 1 TABLET BY MOUTH TWICE DAILY BEFORE A MEAL   rosuvastatin (CRESTOR) 20 MG tablet TAKE 1 TABLET BY MOUTH ONCE DAILY -  STOP  PRAVASTATIN -- NEEDS FOLLOW UP APPOINTMENT FOR REFILLS (Patient taking differently: Take 20 mg by mouth See admin instructions. TAKE 1 TABLET BY MOUTH ONCE DAILY -  STOP  PRAVASTATIN -- NEEDS FOLLOW UP APPOINTMENT FOR REFILLS)   tamsulosin (FLOMAX) 0.4 MG CAPS capsule Take 1 capsule (0.4 mg total) by mouth daily.   No facility-administered encounter medications on file as of 03/10/2021.    Review of Systems  Constitutional:   Positive for fatigue. Negative for chills and fever.  Eyes:  Negative for visual disturbance.  Respiratory:  Negative for shortness of breath and wheezing.   Cardiovascular:  Negative for chest pain and leg swelling.  Skin:  Negative for rash.  Neurological:  Positive for weakness.  Psychiatric/Behavioral:  Positive for confusion.   All other systems reviewed and are negative.  Observations/Objective: patient is asleep the whole time we are on the video call with family  Assessment and Plan: Problem List Items Addressed This Visit       Nervous and Auditory   SAH (subarachnoid hemorrhage) ([place]) - Primary   Relevant Orders   Ambulatory referral to Hospice     Genitourinary   Prostate cancer [facility])   Relevant Orders   Ambulatory referral to Hospice    Refer to hospice care and for comfort care and see about getting him a hospital bed and getting family sent list devices as well as some assistance at home. Follow up plan: Return if symptoms worsen or fail to improve.     I discussed the assessment and treatment plan with the patient. The patient was provided an opportunity to ask questions and all were answered. The patient agreed with the plan and demonstrated an understanding of the instructions.   The patient was advised to call back or seek an in-person evaluation if the symptoms worsen or if the condition fails to improve as anticipated.  The above assessment and management plan was discussed with the patient. The patient verbalized understanding of and has agreed to the management plan. Patient is aware to call the clinic if symptoms persist or worsen. Patient is aware when to return to the clinic for a follow-up visit. Patient educated on when it is appropriate to go to the emergency department.    I provided 21 minutes of non-face-to-face time during this encounter.    Worthy Rancher, MD

## 2021-03-11 ENCOUNTER — Telehealth: Payer: Self-pay | Admitting: Family Medicine

## 2021-03-13 DIAGNOSIS — I129 Hypertensive chronic kidney disease with stage 1 through stage 4 chronic kidney disease, or unspecified chronic kidney disease: Secondary | ICD-10-CM | POA: Diagnosis not present

## 2021-03-13 DIAGNOSIS — R634 Abnormal weight loss: Secondary | ICD-10-CM | POA: Diagnosis not present

## 2021-03-13 DIAGNOSIS — S066XAD Traumatic subarachnoid hemorrhage with loss of consciousness status unknown, subsequent encounter: Secondary | ICD-10-CM | POA: Diagnosis not present

## 2021-03-13 DIAGNOSIS — E669 Obesity, unspecified: Secondary | ICD-10-CM | POA: Diagnosis not present

## 2021-03-13 DIAGNOSIS — Z8616 Personal history of COVID-19: Secondary | ICD-10-CM | POA: Diagnosis not present

## 2021-03-13 DIAGNOSIS — G4733 Obstructive sleep apnea (adult) (pediatric): Secondary | ICD-10-CM | POA: Diagnosis not present

## 2021-03-13 DIAGNOSIS — F32A Depression, unspecified: Secondary | ICD-10-CM | POA: Diagnosis not present

## 2021-03-13 DIAGNOSIS — M1990 Unspecified osteoarthritis, unspecified site: Secondary | ICD-10-CM | POA: Diagnosis not present

## 2021-03-13 DIAGNOSIS — J449 Chronic obstructive pulmonary disease, unspecified: Secondary | ICD-10-CM | POA: Diagnosis not present

## 2021-03-13 DIAGNOSIS — N184 Chronic kidney disease, stage 4 (severe): Secondary | ICD-10-CM | POA: Diagnosis not present

## 2021-03-13 DIAGNOSIS — I440 Atrioventricular block, first degree: Secondary | ICD-10-CM | POA: Diagnosis not present

## 2021-03-13 DIAGNOSIS — K219 Gastro-esophageal reflux disease without esophagitis: Secondary | ICD-10-CM | POA: Diagnosis not present

## 2021-03-13 DIAGNOSIS — I714 Abdominal aortic aneurysm, without rupture, unspecified: Secondary | ICD-10-CM | POA: Diagnosis not present

## 2021-03-13 DIAGNOSIS — E785 Hyperlipidemia, unspecified: Secondary | ICD-10-CM | POA: Diagnosis not present

## 2021-03-13 DIAGNOSIS — C61 Malignant neoplasm of prostate: Secondary | ICD-10-CM | POA: Diagnosis not present

## 2021-03-13 DIAGNOSIS — I2510 Atherosclerotic heart disease of native coronary artery without angina pectoris: Secondary | ICD-10-CM | POA: Diagnosis not present

## 2021-03-13 DIAGNOSIS — F17210 Nicotine dependence, cigarettes, uncomplicated: Secondary | ICD-10-CM | POA: Diagnosis not present

## 2021-03-14 DIAGNOSIS — E785 Hyperlipidemia, unspecified: Secondary | ICD-10-CM | POA: Diagnosis not present

## 2021-03-14 DIAGNOSIS — N184 Chronic kidney disease, stage 4 (severe): Secondary | ICD-10-CM | POA: Diagnosis not present

## 2021-03-14 DIAGNOSIS — I2510 Atherosclerotic heart disease of native coronary artery without angina pectoris: Secondary | ICD-10-CM | POA: Diagnosis not present

## 2021-03-14 DIAGNOSIS — S066XAD Traumatic subarachnoid hemorrhage with loss of consciousness status unknown, subsequent encounter: Secondary | ICD-10-CM | POA: Diagnosis not present

## 2021-03-14 DIAGNOSIS — I129 Hypertensive chronic kidney disease with stage 1 through stage 4 chronic kidney disease, or unspecified chronic kidney disease: Secondary | ICD-10-CM | POA: Diagnosis not present

## 2021-03-14 DIAGNOSIS — G4733 Obstructive sleep apnea (adult) (pediatric): Secondary | ICD-10-CM | POA: Diagnosis not present

## 2021-03-17 ENCOUNTER — Telehealth (HOSPITAL_COMMUNITY): Payer: Self-pay | Admitting: *Deleted

## 2021-03-17 ENCOUNTER — Telehealth: Payer: Self-pay | Admitting: Family Medicine

## 2021-03-17 DIAGNOSIS — I2510 Atherosclerotic heart disease of native coronary artery without angina pectoris: Secondary | ICD-10-CM | POA: Diagnosis not present

## 2021-03-17 DIAGNOSIS — R5381 Other malaise: Secondary | ICD-10-CM | POA: Diagnosis not present

## 2021-03-17 DIAGNOSIS — S066XAD Traumatic subarachnoid hemorrhage with loss of consciousness status unknown, subsequent encounter: Secondary | ICD-10-CM | POA: Diagnosis not present

## 2021-03-17 DIAGNOSIS — W19XXXA Unspecified fall, initial encounter: Secondary | ICD-10-CM | POA: Diagnosis not present

## 2021-03-17 DIAGNOSIS — R531 Weakness: Secondary | ICD-10-CM | POA: Diagnosis not present

## 2021-03-17 DIAGNOSIS — N184 Chronic kidney disease, stage 4 (severe): Secondary | ICD-10-CM | POA: Diagnosis not present

## 2021-03-17 DIAGNOSIS — E785 Hyperlipidemia, unspecified: Secondary | ICD-10-CM | POA: Diagnosis not present

## 2021-03-17 DIAGNOSIS — I129 Hypertensive chronic kidney disease with stage 1 through stage 4 chronic kidney disease, or unspecified chronic kidney disease: Secondary | ICD-10-CM | POA: Diagnosis not present

## 2021-03-17 DIAGNOSIS — G4733 Obstructive sleep apnea (adult) (pediatric): Secondary | ICD-10-CM | POA: Diagnosis not present

## 2021-03-17 MED ORDER — LORAZEPAM 0.5 MG PO TABS: 0.5000 mg | ORAL_TABLET | Freq: Every evening | ORAL | 1 refills | Status: DC | PRN

## 2021-03-17 NOTE — Telephone Encounter (Signed)
Sent lorazepam for sleep, will make standing once we see the hospice order set

## 2021-03-17 NOTE — Telephone Encounter (Signed)
Joseph Art has been made aware. She will call family and let them know. States that [name] has already signed forms.

## 2021-03-17 NOTE — Telephone Encounter (Signed)
Received TC from Clarisa Kindred, RN, who is patient's Hospice nurse.  She was inquiring as to if patient was to stop [name] now that he is now under hospice care.  Confirmed that yes, he does not need to continue on therapy at this point.

## 2021-03-17 NOTE — Telephone Encounter (Signed)
Renee called from [facility] to let pts PCP know that pt was admitted to Hospice this week and reports that pt is not sleeping well. Wants to know if PCP would like to prescribe him some medicine for sleep.   Also wanted to make sure that pts PCP was aware that pt is taking a cancer drug for prostate cancer (Abiraterone 250mg  2x daily)  Can contact Renee at [phone] if needed.

## 2021-03-21 DIAGNOSIS — N184 Chronic kidney disease, stage 4 (severe): Secondary | ICD-10-CM | POA: Diagnosis not present

## 2021-03-21 DIAGNOSIS — G4733 Obstructive sleep apnea (adult) (pediatric): Secondary | ICD-10-CM | POA: Diagnosis not present

## 2021-03-21 DIAGNOSIS — I129 Hypertensive chronic kidney disease with stage 1 through stage 4 chronic kidney disease, or unspecified chronic kidney disease: Secondary | ICD-10-CM | POA: Diagnosis not present

## 2021-03-21 DIAGNOSIS — E785 Hyperlipidemia, unspecified: Secondary | ICD-10-CM | POA: Diagnosis not present

## 2021-03-21 DIAGNOSIS — S066XAD Traumatic subarachnoid hemorrhage with loss of consciousness status unknown, subsequent encounter: Secondary | ICD-10-CM | POA: Diagnosis not present

## 2021-03-21 DIAGNOSIS — I2510 Atherosclerotic heart disease of native coronary artery without angina pectoris: Secondary | ICD-10-CM | POA: Diagnosis not present

## 2021-03-22 DIAGNOSIS — G4733 Obstructive sleep apnea (adult) (pediatric): Secondary | ICD-10-CM | POA: Diagnosis not present

## 2021-03-22 DIAGNOSIS — E785 Hyperlipidemia, unspecified: Secondary | ICD-10-CM | POA: Diagnosis not present

## 2021-03-22 DIAGNOSIS — N184 Chronic kidney disease, stage 4 (severe): Secondary | ICD-10-CM | POA: Diagnosis not present

## 2021-03-22 DIAGNOSIS — I129 Hypertensive chronic kidney disease with stage 1 through stage 4 chronic kidney disease, or unspecified chronic kidney disease: Secondary | ICD-10-CM | POA: Diagnosis not present

## 2021-03-22 DIAGNOSIS — I2510 Atherosclerotic heart disease of native coronary artery without angina pectoris: Secondary | ICD-10-CM | POA: Diagnosis not present

## 2021-03-22 DIAGNOSIS — S066XAD Traumatic subarachnoid hemorrhage with loss of consciousness status unknown, subsequent encounter: Secondary | ICD-10-CM | POA: Diagnosis not present

## 2021-03-24 DIAGNOSIS — G4733 Obstructive sleep apnea (adult) (pediatric): Secondary | ICD-10-CM | POA: Diagnosis not present

## 2021-03-24 DIAGNOSIS — E785 Hyperlipidemia, unspecified: Secondary | ICD-10-CM | POA: Diagnosis not present

## 2021-03-24 DIAGNOSIS — N184 Chronic kidney disease, stage 4 (severe): Secondary | ICD-10-CM | POA: Diagnosis not present

## 2021-03-24 DIAGNOSIS — I2510 Atherosclerotic heart disease of native coronary artery without angina pectoris: Secondary | ICD-10-CM | POA: Diagnosis not present

## 2021-03-24 DIAGNOSIS — S066XAD Traumatic subarachnoid hemorrhage with loss of consciousness status unknown, subsequent encounter: Secondary | ICD-10-CM | POA: Diagnosis not present

## 2021-03-24 DIAGNOSIS — I129 Hypertensive chronic kidney disease with stage 1 through stage 4 chronic kidney disease, or unspecified chronic kidney disease: Secondary | ICD-10-CM | POA: Diagnosis not present

## 2021-03-29 DIAGNOSIS — S066XAD Traumatic subarachnoid hemorrhage with loss of consciousness status unknown, subsequent encounter: Secondary | ICD-10-CM | POA: Diagnosis not present

## 2021-03-29 DIAGNOSIS — N184 Chronic kidney disease, stage 4 (severe): Secondary | ICD-10-CM | POA: Diagnosis not present

## 2021-03-29 DIAGNOSIS — G4733 Obstructive sleep apnea (adult) (pediatric): Secondary | ICD-10-CM | POA: Diagnosis not present

## 2021-03-29 DIAGNOSIS — E785 Hyperlipidemia, unspecified: Secondary | ICD-10-CM | POA: Diagnosis not present

## 2021-03-29 DIAGNOSIS — R531 Weakness: Secondary | ICD-10-CM | POA: Diagnosis not present

## 2021-03-29 DIAGNOSIS — I2510 Atherosclerotic heart disease of native coronary artery without angina pectoris: Secondary | ICD-10-CM | POA: Diagnosis not present

## 2021-03-29 DIAGNOSIS — R5381 Other malaise: Secondary | ICD-10-CM | POA: Diagnosis not present

## 2021-03-29 DIAGNOSIS — Z743 Need for continuous supervision: Secondary | ICD-10-CM | POA: Diagnosis not present

## 2021-03-29 DIAGNOSIS — W19XXXA Unspecified fall, initial encounter: Secondary | ICD-10-CM | POA: Diagnosis not present

## 2021-03-29 DIAGNOSIS — I129 Hypertensive chronic kidney disease with stage 1 through stage 4 chronic kidney disease, or unspecified chronic kidney disease: Secondary | ICD-10-CM | POA: Diagnosis not present

## 2021-03-31 DIAGNOSIS — S066XAD Traumatic subarachnoid hemorrhage with loss of consciousness status unknown, subsequent encounter: Secondary | ICD-10-CM | POA: Diagnosis not present

## 2021-03-31 DIAGNOSIS — N184 Chronic kidney disease, stage 4 (severe): Secondary | ICD-10-CM | POA: Diagnosis not present

## 2021-03-31 DIAGNOSIS — G4733 Obstructive sleep apnea (adult) (pediatric): Secondary | ICD-10-CM | POA: Diagnosis not present

## 2021-03-31 DIAGNOSIS — I2510 Atherosclerotic heart disease of native coronary artery without angina pectoris: Secondary | ICD-10-CM | POA: Diagnosis not present

## 2021-03-31 DIAGNOSIS — I129 Hypertensive chronic kidney disease with stage 1 through stage 4 chronic kidney disease, or unspecified chronic kidney disease: Secondary | ICD-10-CM | POA: Diagnosis not present

## 2021-03-31 DIAGNOSIS — E785 Hyperlipidemia, unspecified: Secondary | ICD-10-CM | POA: Diagnosis not present

## 2021-04-03 DIAGNOSIS — I440 Atrioventricular block, first degree: Secondary | ICD-10-CM | POA: Diagnosis not present

## 2021-04-03 DIAGNOSIS — I2510 Atherosclerotic heart disease of native coronary artery without angina pectoris: Secondary | ICD-10-CM | POA: Diagnosis not present

## 2021-04-03 DIAGNOSIS — Z8616 Personal history of COVID-19: Secondary | ICD-10-CM | POA: Diagnosis not present

## 2021-04-03 DIAGNOSIS — I714 Abdominal aortic aneurysm, without rupture, unspecified: Secondary | ICD-10-CM | POA: Diagnosis not present

## 2021-04-03 DIAGNOSIS — C61 Malignant neoplasm of prostate: Secondary | ICD-10-CM | POA: Diagnosis not present

## 2021-04-03 DIAGNOSIS — S066XAD Traumatic subarachnoid hemorrhage with loss of consciousness status unknown, subsequent encounter: Secondary | ICD-10-CM | POA: Diagnosis not present

## 2021-04-03 DIAGNOSIS — M1990 Unspecified osteoarthritis, unspecified site: Secondary | ICD-10-CM | POA: Diagnosis not present

## 2021-04-03 DIAGNOSIS — G4733 Obstructive sleep apnea (adult) (pediatric): Secondary | ICD-10-CM | POA: Diagnosis not present

## 2021-04-03 DIAGNOSIS — F32A Depression, unspecified: Secondary | ICD-10-CM | POA: Diagnosis not present

## 2021-04-03 DIAGNOSIS — J449 Chronic obstructive pulmonary disease, unspecified: Secondary | ICD-10-CM | POA: Diagnosis not present

## 2021-04-03 DIAGNOSIS — E669 Obesity, unspecified: Secondary | ICD-10-CM | POA: Diagnosis not present

## 2021-04-03 DIAGNOSIS — I129 Hypertensive chronic kidney disease with stage 1 through stage 4 chronic kidney disease, or unspecified chronic kidney disease: Secondary | ICD-10-CM | POA: Diagnosis not present

## 2021-04-03 DIAGNOSIS — E785 Hyperlipidemia, unspecified: Secondary | ICD-10-CM | POA: Diagnosis not present

## 2021-04-03 DIAGNOSIS — R634 Abnormal weight loss: Secondary | ICD-10-CM | POA: Diagnosis not present

## 2021-04-03 DIAGNOSIS — K219 Gastro-esophageal reflux disease without esophagitis: Secondary | ICD-10-CM | POA: Diagnosis not present

## 2021-04-03 DIAGNOSIS — N184 Chronic kidney disease, stage 4 (severe): Secondary | ICD-10-CM | POA: Diagnosis not present

## 2021-04-03 DIAGNOSIS — F17210 Nicotine dependence, cigarettes, uncomplicated: Secondary | ICD-10-CM | POA: Diagnosis not present

## 2021-04-05 DIAGNOSIS — E785 Hyperlipidemia, unspecified: Secondary | ICD-10-CM | POA: Diagnosis not present

## 2021-04-05 DIAGNOSIS — I2510 Atherosclerotic heart disease of native coronary artery without angina pectoris: Secondary | ICD-10-CM | POA: Diagnosis not present

## 2021-04-05 DIAGNOSIS — N184 Chronic kidney disease, stage 4 (severe): Secondary | ICD-10-CM | POA: Diagnosis not present

## 2021-04-05 DIAGNOSIS — G4733 Obstructive sleep apnea (adult) (pediatric): Secondary | ICD-10-CM | POA: Diagnosis not present

## 2021-04-05 DIAGNOSIS — I129 Hypertensive chronic kidney disease with stage 1 through stage 4 chronic kidney disease, or unspecified chronic kidney disease: Secondary | ICD-10-CM | POA: Diagnosis not present

## 2021-04-05 DIAGNOSIS — S066XAD Traumatic subarachnoid hemorrhage with loss of consciousness status unknown, subsequent encounter: Secondary | ICD-10-CM | POA: Diagnosis not present

## 2021-04-07 ENCOUNTER — Telehealth: Payer: Self-pay | Admitting: *Deleted

## 2021-04-07 DIAGNOSIS — S066XAD Traumatic subarachnoid hemorrhage with loss of consciousness status unknown, subsequent encounter: Secondary | ICD-10-CM | POA: Diagnosis not present

## 2021-04-07 DIAGNOSIS — I129 Hypertensive chronic kidney disease with stage 1 through stage 4 chronic kidney disease, or unspecified chronic kidney disease: Secondary | ICD-10-CM | POA: Diagnosis not present

## 2021-04-07 DIAGNOSIS — I2510 Atherosclerotic heart disease of native coronary artery without angina pectoris: Secondary | ICD-10-CM | POA: Diagnosis not present

## 2021-04-07 DIAGNOSIS — E785 Hyperlipidemia, unspecified: Secondary | ICD-10-CM | POA: Diagnosis not present

## 2021-04-07 DIAGNOSIS — N184 Chronic kidney disease, stage 4 (severe): Secondary | ICD-10-CM | POA: Diagnosis not present

## 2021-04-07 DIAGNOSIS — G4733 Obstructive sleep apnea (adult) (pediatric): Secondary | ICD-10-CM | POA: Diagnosis not present

## 2021-04-07 NOTE — Telephone Encounter (Signed)
Tried Haematologist with Hospice. No answer and voicemail not set up?  Tried calling pt's home number. No answer and no voicemail.

## 2021-04-07 NOTE — Telephone Encounter (Signed)
It looks like the note from the hospital on that visit says to do twice daily for 1 week and then discontinue, so at this point I would have him go down to taking it once a day for a week and then discontinue it

## 2021-04-07 NOTE — Telephone Encounter (Signed)
TC w/ Renee from [facility] pt today, improving remarkably, is able to do transfers, not walking yet, still forgetful She asks wife about refill on meds but she is unsure She asked if Dr. Warrick Parisian still wants him to take the [place] Please advise, this was prescribed by Dr. Pilar Jarvis in the hospital back 11/23, if so send to [facility]

## 2021-04-12 DIAGNOSIS — S066XAD Traumatic subarachnoid hemorrhage with loss of consciousness status unknown, subsequent encounter: Secondary | ICD-10-CM | POA: Diagnosis not present

## 2021-04-12 DIAGNOSIS — I129 Hypertensive chronic kidney disease with stage 1 through stage 4 chronic kidney disease, or unspecified chronic kidney disease: Secondary | ICD-10-CM | POA: Diagnosis not present

## 2021-04-12 DIAGNOSIS — G4733 Obstructive sleep apnea (adult) (pediatric): Secondary | ICD-10-CM | POA: Diagnosis not present

## 2021-04-12 DIAGNOSIS — E785 Hyperlipidemia, unspecified: Secondary | ICD-10-CM | POA: Diagnosis not present

## 2021-04-12 DIAGNOSIS — I2510 Atherosclerotic heart disease of native coronary artery without angina pectoris: Secondary | ICD-10-CM | POA: Diagnosis not present

## 2021-04-12 DIAGNOSIS — N184 Chronic kidney disease, stage 4 (severe): Secondary | ICD-10-CM | POA: Diagnosis not present

## 2021-04-12 NOTE — Telephone Encounter (Signed)
Renee aware that Keppra was to be for BID x 1 wk then DC, as for the Lorazepam it was sent to [facility] on 03/17/21 #30 with a refill. She had called them while at the house this morning and they told her they did not have a prescription for him When I called [facility], they filled Lorazepam on 03/17/21 therefore next refill can not be done until 04/15/21 at earliest.

## 2021-04-12 NOTE — Telephone Encounter (Signed)
Wife aware pt was to stop the Keppra and the Lorazepam was filled on 03/17/21 so he is unable to get the next refill until Friday at the earliest. She verbalizes understanding.

## 2021-04-12 NOTE — Telephone Encounter (Signed)
Tried calling Renee and patient this morning. No answer on either phone and no vociemail available. At this point pt may have completed the course of weaning off of medication.  Will close encounter.

## 2021-04-12 NOTE — Telephone Encounter (Signed)
Renee called stating that pt really needs his Keppra and Ativan Rxs called in to pharmacy ASAP because he has been without them for a week.   Needs Rx's sent to [facility] on [address] in [place].   Can call Renee at [phone] about this.

## 2021-04-14 DIAGNOSIS — N184 Chronic kidney disease, stage 4 (severe): Secondary | ICD-10-CM | POA: Diagnosis not present

## 2021-04-14 DIAGNOSIS — G4733 Obstructive sleep apnea (adult) (pediatric): Secondary | ICD-10-CM | POA: Diagnosis not present

## 2021-04-14 DIAGNOSIS — I2510 Atherosclerotic heart disease of native coronary artery without angina pectoris: Secondary | ICD-10-CM | POA: Diagnosis not present

## 2021-04-14 DIAGNOSIS — S066XAD Traumatic subarachnoid hemorrhage with loss of consciousness status unknown, subsequent encounter: Secondary | ICD-10-CM | POA: Diagnosis not present

## 2021-04-14 DIAGNOSIS — I129 Hypertensive chronic kidney disease with stage 1 through stage 4 chronic kidney disease, or unspecified chronic kidney disease: Secondary | ICD-10-CM | POA: Diagnosis not present

## 2021-04-14 DIAGNOSIS — E785 Hyperlipidemia, unspecified: Secondary | ICD-10-CM | POA: Diagnosis not present

## 2021-04-19 DIAGNOSIS — I129 Hypertensive chronic kidney disease with stage 1 through stage 4 chronic kidney disease, or unspecified chronic kidney disease: Secondary | ICD-10-CM | POA: Diagnosis not present

## 2021-04-19 DIAGNOSIS — E785 Hyperlipidemia, unspecified: Secondary | ICD-10-CM | POA: Diagnosis not present

## 2021-04-19 DIAGNOSIS — I2510 Atherosclerotic heart disease of native coronary artery without angina pectoris: Secondary | ICD-10-CM | POA: Diagnosis not present

## 2021-04-19 DIAGNOSIS — S066XAD Traumatic subarachnoid hemorrhage with loss of consciousness status unknown, subsequent encounter: Secondary | ICD-10-CM | POA: Diagnosis not present

## 2021-04-19 DIAGNOSIS — G4733 Obstructive sleep apnea (adult) (pediatric): Secondary | ICD-10-CM | POA: Diagnosis not present

## 2021-04-19 DIAGNOSIS — N184 Chronic kidney disease, stage 4 (severe): Secondary | ICD-10-CM | POA: Diagnosis not present

## 2021-04-26 DIAGNOSIS — N184 Chronic kidney disease, stage 4 (severe): Secondary | ICD-10-CM | POA: Diagnosis not present

## 2021-04-26 DIAGNOSIS — I129 Hypertensive chronic kidney disease with stage 1 through stage 4 chronic kidney disease, or unspecified chronic kidney disease: Secondary | ICD-10-CM | POA: Diagnosis not present

## 2021-04-26 DIAGNOSIS — I2510 Atherosclerotic heart disease of native coronary artery without angina pectoris: Secondary | ICD-10-CM | POA: Diagnosis not present

## 2021-04-26 DIAGNOSIS — G4733 Obstructive sleep apnea (adult) (pediatric): Secondary | ICD-10-CM | POA: Diagnosis not present

## 2021-04-26 DIAGNOSIS — S066XAD Traumatic subarachnoid hemorrhage with loss of consciousness status unknown, subsequent encounter: Secondary | ICD-10-CM | POA: Diagnosis not present

## 2021-04-26 DIAGNOSIS — E785 Hyperlipidemia, unspecified: Secondary | ICD-10-CM | POA: Diagnosis not present

## 2021-05-03 DIAGNOSIS — I2510 Atherosclerotic heart disease of native coronary artery without angina pectoris: Secondary | ICD-10-CM | POA: Diagnosis not present

## 2021-05-03 DIAGNOSIS — I129 Hypertensive chronic kidney disease with stage 1 through stage 4 chronic kidney disease, or unspecified chronic kidney disease: Secondary | ICD-10-CM | POA: Diagnosis not present

## 2021-05-03 DIAGNOSIS — G4733 Obstructive sleep apnea (adult) (pediatric): Secondary | ICD-10-CM | POA: Diagnosis not present

## 2021-05-03 DIAGNOSIS — N184 Chronic kidney disease, stage 4 (severe): Secondary | ICD-10-CM | POA: Diagnosis not present

## 2021-05-03 DIAGNOSIS — E785 Hyperlipidemia, unspecified: Secondary | ICD-10-CM | POA: Diagnosis not present

## 2021-05-03 DIAGNOSIS — S066XAD Traumatic subarachnoid hemorrhage with loss of consciousness status unknown, subsequent encounter: Secondary | ICD-10-CM | POA: Diagnosis not present

## 2021-05-04 DIAGNOSIS — J449 Chronic obstructive pulmonary disease, unspecified: Secondary | ICD-10-CM | POA: Diagnosis not present

## 2021-05-04 DIAGNOSIS — F32A Depression, unspecified: Secondary | ICD-10-CM | POA: Diagnosis not present

## 2021-05-04 DIAGNOSIS — E669 Obesity, unspecified: Secondary | ICD-10-CM | POA: Diagnosis not present

## 2021-05-04 DIAGNOSIS — S066XAD Traumatic subarachnoid hemorrhage with loss of consciousness status unknown, subsequent encounter: Secondary | ICD-10-CM | POA: Diagnosis not present

## 2021-05-04 DIAGNOSIS — K219 Gastro-esophageal reflux disease without esophagitis: Secondary | ICD-10-CM | POA: Diagnosis not present

## 2021-05-04 DIAGNOSIS — F17210 Nicotine dependence, cigarettes, uncomplicated: Secondary | ICD-10-CM | POA: Diagnosis not present

## 2021-05-04 DIAGNOSIS — I440 Atrioventricular block, first degree: Secondary | ICD-10-CM | POA: Diagnosis not present

## 2021-05-04 DIAGNOSIS — Z8616 Personal history of COVID-19: Secondary | ICD-10-CM | POA: Diagnosis not present

## 2021-05-04 DIAGNOSIS — R634 Abnormal weight loss: Secondary | ICD-10-CM | POA: Diagnosis not present

## 2021-05-04 DIAGNOSIS — I714 Abdominal aortic aneurysm, without rupture, unspecified: Secondary | ICD-10-CM | POA: Diagnosis not present

## 2021-05-04 DIAGNOSIS — E785 Hyperlipidemia, unspecified: Secondary | ICD-10-CM | POA: Diagnosis not present

## 2021-05-04 DIAGNOSIS — G4733 Obstructive sleep apnea (adult) (pediatric): Secondary | ICD-10-CM | POA: Diagnosis not present

## 2021-05-04 DIAGNOSIS — M1990 Unspecified osteoarthritis, unspecified site: Secondary | ICD-10-CM | POA: Diagnosis not present

## 2021-05-04 DIAGNOSIS — C61 Malignant neoplasm of prostate: Secondary | ICD-10-CM | POA: Diagnosis not present

## 2021-05-04 DIAGNOSIS — I129 Hypertensive chronic kidney disease with stage 1 through stage 4 chronic kidney disease, or unspecified chronic kidney disease: Secondary | ICD-10-CM | POA: Diagnosis not present

## 2021-05-04 DIAGNOSIS — I2510 Atherosclerotic heart disease of native coronary artery without angina pectoris: Secondary | ICD-10-CM | POA: Diagnosis not present

## 2021-05-04 DIAGNOSIS — N184 Chronic kidney disease, stage 4 (severe): Secondary | ICD-10-CM | POA: Diagnosis not present

## 2021-05-10 DIAGNOSIS — I2510 Atherosclerotic heart disease of native coronary artery without angina pectoris: Secondary | ICD-10-CM | POA: Diagnosis not present

## 2021-05-10 DIAGNOSIS — S066XAD Traumatic subarachnoid hemorrhage with loss of consciousness status unknown, subsequent encounter: Secondary | ICD-10-CM | POA: Diagnosis not present

## 2021-05-10 DIAGNOSIS — I129 Hypertensive chronic kidney disease with stage 1 through stage 4 chronic kidney disease, or unspecified chronic kidney disease: Secondary | ICD-10-CM | POA: Diagnosis not present

## 2021-05-10 DIAGNOSIS — E785 Hyperlipidemia, unspecified: Secondary | ICD-10-CM | POA: Diagnosis not present

## 2021-05-10 DIAGNOSIS — G4733 Obstructive sleep apnea (adult) (pediatric): Secondary | ICD-10-CM | POA: Diagnosis not present

## 2021-05-10 DIAGNOSIS — N184 Chronic kidney disease, stage 4 (severe): Secondary | ICD-10-CM | POA: Diagnosis not present

## 2021-05-11 DIAGNOSIS — E785 Hyperlipidemia, unspecified: Secondary | ICD-10-CM | POA: Diagnosis not present

## 2021-05-11 DIAGNOSIS — S066XAD Traumatic subarachnoid hemorrhage with loss of consciousness status unknown, subsequent encounter: Secondary | ICD-10-CM | POA: Diagnosis not present

## 2021-05-11 DIAGNOSIS — G4733 Obstructive sleep apnea (adult) (pediatric): Secondary | ICD-10-CM | POA: Diagnosis not present

## 2021-05-11 DIAGNOSIS — I2510 Atherosclerotic heart disease of native coronary artery without angina pectoris: Secondary | ICD-10-CM | POA: Diagnosis not present

## 2021-05-11 DIAGNOSIS — I129 Hypertensive chronic kidney disease with stage 1 through stage 4 chronic kidney disease, or unspecified chronic kidney disease: Secondary | ICD-10-CM | POA: Diagnosis not present

## 2021-05-11 DIAGNOSIS — N184 Chronic kidney disease, stage 4 (severe): Secondary | ICD-10-CM | POA: Diagnosis not present

## 2021-05-17 DIAGNOSIS — I129 Hypertensive chronic kidney disease with stage 1 through stage 4 chronic kidney disease, or unspecified chronic kidney disease: Secondary | ICD-10-CM | POA: Diagnosis not present

## 2021-05-17 DIAGNOSIS — E785 Hyperlipidemia, unspecified: Secondary | ICD-10-CM | POA: Diagnosis not present

## 2021-05-17 DIAGNOSIS — I2510 Atherosclerotic heart disease of native coronary artery without angina pectoris: Secondary | ICD-10-CM | POA: Diagnosis not present

## 2021-05-17 DIAGNOSIS — G4733 Obstructive sleep apnea (adult) (pediatric): Secondary | ICD-10-CM | POA: Diagnosis not present

## 2021-05-17 DIAGNOSIS — N184 Chronic kidney disease, stage 4 (severe): Secondary | ICD-10-CM | POA: Diagnosis not present

## 2021-05-17 DIAGNOSIS — S066XAD Traumatic subarachnoid hemorrhage with loss of consciousness status unknown, subsequent encounter: Secondary | ICD-10-CM | POA: Diagnosis not present

## 2021-05-19 ENCOUNTER — Ambulatory Visit (INDEPENDENT_AMBULATORY_CARE_PROVIDER_SITE_OTHER): Admitting: Family Medicine

## 2021-05-19 ENCOUNTER — Encounter: Payer: Self-pay | Admitting: Family Medicine

## 2021-05-19 VITALS — BP 133/81 | HR 76 | Ht 67.0 in | Wt 227.0 lb

## 2021-05-19 DIAGNOSIS — I609 Nontraumatic subarachnoid hemorrhage, unspecified: Secondary | ICD-10-CM

## 2021-05-19 DIAGNOSIS — R4182 Altered mental status, unspecified: Secondary | ICD-10-CM | POA: Diagnosis not present

## 2021-05-19 NOTE — Progress Notes (Signed)
BP 133/81    Pulse 76    Ht 5\' 7"  (1.702 m)    Wt 227 lb (103 kg)    SpO2 99%    BMI 35.55 kg/m    Subjective:   Patient ID: [name], male    DOB: 04-22-1945, 76 y.o.   MRN: [id number]  HPI: [name] is a 76 y.o. male presenting on 05/19/2021 for Altered Mental Status (Hit head, seen in ER, symptoms worse during that time. Also had Covid at that time.)   HPI Patient is coming in today for follow-up, his confusion and mental status that happened after his subarachnoid hemorrhage has improved.  He is still having some confusion but per family it is much improved.  He is discussing coming off of hospice but he is getting a lot of home care and he cannot move and take care of himself and also his wife cannot either so that is something that if they came off they would likely need some form of home health care and we discussed some difficulties with getting that at times.  Relevant past medical, surgical, family and social history reviewed and updated as indicated. Interim medical history since our last visit reviewed. Allergies and medications reviewed and updated.  Review of Systems  Constitutional:  Negative for chills and fever.  Respiratory:  Negative for shortness of breath and wheezing.   Cardiovascular:  Negative for chest pain and leg swelling.  Musculoskeletal:  Positive for arthralgias and back pain. Negative for gait problem.  Skin:  Negative for rash.  Neurological:  Positive for weakness.  Psychiatric/Behavioral:  Positive for confusion.   All other systems reviewed and are negative.  Per HPI unless specifically indicated above   Allergies as of 05/19/2021       Reactions   Carbidopa-levodopa Other (See Comments)   hallunications   Morphine Sulfate Nausea And Vomiting   Sulfa Antibiotics Nausea And Vomiting   Sulfacetamide Sodium Nausea And Vomiting        Medication List        Accurate as of May 19, 2021  9:53 AM. If you have any  questions, ask your nurse or doctor.          STOP taking these medications    LORazepam 0.5 MG tablet Commonly known as: ATIVAN Stopped by: [name] Hawa Henly, MD       TAKE these medications    abiraterone acetate 250 MG tablet Commonly known as: ZYTIGA Take 2 tablets (500 mg total) by mouth daily. Take on an empty stomach 1 hour before or 2 hours after a meal   acetaminophen 325 MG tablet Commonly known as: TYLENOL Take 2 tablets (650 mg total) by mouth every 6 (six) hours as needed for mild pain (or Fever >/= 101).   albuterol 108 (90 Base) MCG/ACT inhaler Commonly known as: VENTOLIN HFA Inhale 1-2 puffs into the lungs every 6 (six) hours as needed for wheezing or shortness of breath.   benzonatate 200 MG capsule Commonly known as: TESSALON Take 1 capsule (200 mg total) by mouth 3 (three) times daily as needed for cough.   carvedilol 25 MG tablet Commonly known as: COREG Take 0.5 tablets (12.5 mg total) by mouth 2 (two) times daily.   cloNIDine 0.1 MG tablet Commonly known as: Catapres Take 1 tablet (0.1 mg total) by mouth 2 (two) times daily. Take for blood pressure > 180, and make sure u lay flat and get up very slowl if  u take this--this can drop ur pressure and cause u to feel dizzy   DULoxetine 60 MG capsule Commonly known as: CYMBALTA Take 1 capsule (60 mg total) by mouth daily. Take 1 capsule by mouth once daily (Needs to be seen before next refill)   levETIRAcetam 500 MG tablet Commonly known as: KEPPRA Take 1 tablet (500 mg total) by mouth 2 (two) times daily.   pantoprazole 40 MG tablet Commonly known as: PROTONIX TAKE 1 TABLET BY MOUTH TWICE DAILY BEFORE A MEAL   rosuvastatin 20 MG tablet Commonly known as: CRESTOR TAKE 1 TABLET BY MOUTH ONCE DAILY -  STOP  PRAVASTATIN -- NEEDS FOLLOW UP APPOINTMENT FOR REFILLS What changed: See the new instructions.   tamsulosin 0.4 MG Caps capsule Commonly known as: FLOMAX Take 1 capsule (0.4 mg total) by  mouth daily.         Objective:   BP 133/81    Pulse 76    Ht 5\' 7"  (1.702 m)    Wt 227 lb (103 kg)    SpO2 99%    BMI 35.55 kg/m   Wt Readings from Last 3 Encounters:  05/19/21 227 lb (103 kg)  03/03/21 231 lb 7.7 oz (105 kg)  02/28/21 230 lb (104.3 kg)    Physical Exam Vitals and nursing note reviewed.  Constitutional:      General: He is not in acute distress.    Appearance: He is well-developed. He is not diaphoretic.  Eyes:     General: No scleral icterus.    Conjunctiva/sclera: Conjunctivae normal.  Neck:     Thyroid: No thyromegaly.  Cardiovascular:     Rate and Rhythm: Normal rate and regular rhythm.     Heart sounds: Normal heart sounds.  Pulmonary:     Effort: Pulmonary effort is normal. No respiratory distress.     Breath sounds: Normal breath sounds. No wheezing.  Musculoskeletal:     Cervical back: Neck supple.  Lymphadenopathy:     Cervical: No cervical adenopathy.  Skin:    General: Skin is warm and dry.     Findings: No rash.  Neurological:     Mental Status: He is alert and oriented to person, place, and time.     Coordination: Coordination normal.  Psychiatric:        Behavior: Behavior normal.        Thought Content: Thought content does not include suicidal ideation. Thought content does not include suicidal plan.        Cognition and Memory: He exhibits impaired recent memory.      Assessment & Plan:   Problem List Items Addressed This Visit       Nervous and Auditory   SAH (subarachnoid hemorrhage) ([place])   Other Visit Diagnoses     Altered mental status, unspecified altered mental status type    -  Primary       Seems to improve, he wants to evaluate whether to continue hospice or go back to his cancer treatment.  He is still not mobile and unable to get up on his own.  His wife is in the same boat.  Gave a list of strengthening exercise Follow up plan: Return if symptoms worsen or fail to improve.  Counseling provided for all  of the vaccine components No orders of the defined types were placed in this encounter.   Caryl Pina, MD [facility] 05/19/2021, 9:53 AM

## 2021-05-24 ENCOUNTER — Telehealth: Payer: Self-pay | Admitting: Family Medicine

## 2021-05-24 DIAGNOSIS — N184 Chronic kidney disease, stage 4 (severe): Secondary | ICD-10-CM | POA: Diagnosis not present

## 2021-05-24 DIAGNOSIS — I2510 Atherosclerotic heart disease of native coronary artery without angina pectoris: Secondary | ICD-10-CM | POA: Diagnosis not present

## 2021-05-24 DIAGNOSIS — I129 Hypertensive chronic kidney disease with stage 1 through stage 4 chronic kidney disease, or unspecified chronic kidney disease: Secondary | ICD-10-CM | POA: Diagnosis not present

## 2021-05-24 DIAGNOSIS — G4733 Obstructive sleep apnea (adult) (pediatric): Secondary | ICD-10-CM | POA: Diagnosis not present

## 2021-05-24 DIAGNOSIS — R351 Nocturia: Secondary | ICD-10-CM

## 2021-05-24 DIAGNOSIS — E785 Hyperlipidemia, unspecified: Secondary | ICD-10-CM | POA: Diagnosis not present

## 2021-05-24 DIAGNOSIS — S066XAD Traumatic subarachnoid hemorrhage with loss of consciousness status unknown, subsequent encounter: Secondary | ICD-10-CM | POA: Diagnosis not present

## 2021-05-24 DIAGNOSIS — F339 Major depressive disorder, recurrent, unspecified: Secondary | ICD-10-CM

## 2021-05-24 DIAGNOSIS — G6289 Other specified polyneuropathies: Secondary | ICD-10-CM

## 2021-05-24 NOTE — Telephone Encounter (Signed)
°  Prescription Request  05/24/2021  What is the name of the medication or equipment? TAMSULOSIN and DULOXETINE  Have you contacted your pharmacy to request a refill? YES  Which pharmacy would you like this sent to? [facility], [place] # [id number]  [facility] called from Hospice stating that pt needs refills on meds and send to [facility] in [place], Store # [phone] and Phone # [phone]

## 2021-05-25 ENCOUNTER — Other Ambulatory Visit: Payer: Self-pay

## 2021-05-25 DIAGNOSIS — G4733 Obstructive sleep apnea (adult) (pediatric): Secondary | ICD-10-CM | POA: Diagnosis not present

## 2021-05-25 DIAGNOSIS — I129 Hypertensive chronic kidney disease with stage 1 through stage 4 chronic kidney disease, or unspecified chronic kidney disease: Secondary | ICD-10-CM | POA: Diagnosis not present

## 2021-05-25 DIAGNOSIS — E785 Hyperlipidemia, unspecified: Secondary | ICD-10-CM | POA: Diagnosis not present

## 2021-05-25 DIAGNOSIS — S066XAD Traumatic subarachnoid hemorrhage with loss of consciousness status unknown, subsequent encounter: Secondary | ICD-10-CM | POA: Diagnosis not present

## 2021-05-25 DIAGNOSIS — I2510 Atherosclerotic heart disease of native coronary artery without angina pectoris: Secondary | ICD-10-CM | POA: Diagnosis not present

## 2021-05-25 DIAGNOSIS — N184 Chronic kidney disease, stage 4 (severe): Secondary | ICD-10-CM | POA: Diagnosis not present

## 2021-05-25 DIAGNOSIS — R351 Nocturia: Secondary | ICD-10-CM

## 2021-05-25 DIAGNOSIS — F339 Major depressive disorder, recurrent, unspecified: Secondary | ICD-10-CM

## 2021-05-25 DIAGNOSIS — G6289 Other specified polyneuropathies: Secondary | ICD-10-CM

## 2021-05-25 MED ORDER — TAMSULOSIN HCL 0.4 MG PO CAPS: 0.4000 mg | ORAL_CAPSULE | Freq: Every day | ORAL | 3 refills | Status: DC

## 2021-05-25 MED ORDER — DULOXETINE HCL 60 MG PO CPEP: 60.0000 mg | ORAL_CAPSULE | Freq: Every day | ORAL | 3 refills | Status: DC

## 2021-05-25 NOTE — Telephone Encounter (Signed)
Refills sent to [facility] in [place].

## 2021-05-31 DIAGNOSIS — E785 Hyperlipidemia, unspecified: Secondary | ICD-10-CM | POA: Diagnosis not present

## 2021-05-31 DIAGNOSIS — G4733 Obstructive sleep apnea (adult) (pediatric): Secondary | ICD-10-CM | POA: Diagnosis not present

## 2021-05-31 DIAGNOSIS — I129 Hypertensive chronic kidney disease with stage 1 through stage 4 chronic kidney disease, or unspecified chronic kidney disease: Secondary | ICD-10-CM | POA: Diagnosis not present

## 2021-05-31 DIAGNOSIS — S066XAD Traumatic subarachnoid hemorrhage with loss of consciousness status unknown, subsequent encounter: Secondary | ICD-10-CM | POA: Diagnosis not present

## 2021-05-31 DIAGNOSIS — I2510 Atherosclerotic heart disease of native coronary artery without angina pectoris: Secondary | ICD-10-CM | POA: Diagnosis not present

## 2021-05-31 DIAGNOSIS — N184 Chronic kidney disease, stage 4 (severe): Secondary | ICD-10-CM | POA: Diagnosis not present

## 2021-06-01 DIAGNOSIS — J449 Chronic obstructive pulmonary disease, unspecified: Secondary | ICD-10-CM | POA: Diagnosis not present

## 2021-06-01 DIAGNOSIS — I714 Abdominal aortic aneurysm, without rupture, unspecified: Secondary | ICD-10-CM | POA: Diagnosis not present

## 2021-06-01 DIAGNOSIS — F32A Depression, unspecified: Secondary | ICD-10-CM | POA: Diagnosis not present

## 2021-06-01 DIAGNOSIS — N184 Chronic kidney disease, stage 4 (severe): Secondary | ICD-10-CM | POA: Diagnosis not present

## 2021-06-01 DIAGNOSIS — I2510 Atherosclerotic heart disease of native coronary artery without angina pectoris: Secondary | ICD-10-CM | POA: Diagnosis not present

## 2021-06-01 DIAGNOSIS — M1990 Unspecified osteoarthritis, unspecified site: Secondary | ICD-10-CM | POA: Diagnosis not present

## 2021-06-01 DIAGNOSIS — F17210 Nicotine dependence, cigarettes, uncomplicated: Secondary | ICD-10-CM | POA: Diagnosis not present

## 2021-06-01 DIAGNOSIS — G4733 Obstructive sleep apnea (adult) (pediatric): Secondary | ICD-10-CM | POA: Diagnosis not present

## 2021-06-01 DIAGNOSIS — C61 Malignant neoplasm of prostate: Secondary | ICD-10-CM | POA: Diagnosis not present

## 2021-06-01 DIAGNOSIS — I129 Hypertensive chronic kidney disease with stage 1 through stage 4 chronic kidney disease, or unspecified chronic kidney disease: Secondary | ICD-10-CM | POA: Diagnosis not present

## 2021-06-01 DIAGNOSIS — S066XAD Traumatic subarachnoid hemorrhage with loss of consciousness status unknown, subsequent encounter: Secondary | ICD-10-CM | POA: Diagnosis not present

## 2021-06-01 DIAGNOSIS — K219 Gastro-esophageal reflux disease without esophagitis: Secondary | ICD-10-CM | POA: Diagnosis not present

## 2021-06-01 DIAGNOSIS — R634 Abnormal weight loss: Secondary | ICD-10-CM | POA: Diagnosis not present

## 2021-06-01 DIAGNOSIS — Z8616 Personal history of COVID-19: Secondary | ICD-10-CM | POA: Diagnosis not present

## 2021-06-01 DIAGNOSIS — I440 Atrioventricular block, first degree: Secondary | ICD-10-CM | POA: Diagnosis not present

## 2021-06-01 DIAGNOSIS — E669 Obesity, unspecified: Secondary | ICD-10-CM | POA: Diagnosis not present

## 2021-06-01 DIAGNOSIS — E785 Hyperlipidemia, unspecified: Secondary | ICD-10-CM | POA: Diagnosis not present

## 2021-06-06 DIAGNOSIS — E785 Hyperlipidemia, unspecified: Secondary | ICD-10-CM | POA: Diagnosis not present

## 2021-06-06 DIAGNOSIS — I129 Hypertensive chronic kidney disease with stage 1 through stage 4 chronic kidney disease, or unspecified chronic kidney disease: Secondary | ICD-10-CM | POA: Diagnosis not present

## 2021-06-06 DIAGNOSIS — N184 Chronic kidney disease, stage 4 (severe): Secondary | ICD-10-CM | POA: Diagnosis not present

## 2021-06-06 DIAGNOSIS — G4733 Obstructive sleep apnea (adult) (pediatric): Secondary | ICD-10-CM | POA: Diagnosis not present

## 2021-06-06 DIAGNOSIS — I2510 Atherosclerotic heart disease of native coronary artery without angina pectoris: Secondary | ICD-10-CM | POA: Diagnosis not present

## 2021-06-06 DIAGNOSIS — S066XAD Traumatic subarachnoid hemorrhage with loss of consciousness status unknown, subsequent encounter: Secondary | ICD-10-CM | POA: Diagnosis not present

## 2021-06-08 DIAGNOSIS — I129 Hypertensive chronic kidney disease with stage 1 through stage 4 chronic kidney disease, or unspecified chronic kidney disease: Secondary | ICD-10-CM | POA: Diagnosis not present

## 2021-06-08 DIAGNOSIS — N184 Chronic kidney disease, stage 4 (severe): Secondary | ICD-10-CM | POA: Diagnosis not present

## 2021-06-08 DIAGNOSIS — G4733 Obstructive sleep apnea (adult) (pediatric): Secondary | ICD-10-CM | POA: Diagnosis not present

## 2021-06-08 DIAGNOSIS — I2510 Atherosclerotic heart disease of native coronary artery without angina pectoris: Secondary | ICD-10-CM | POA: Diagnosis not present

## 2021-06-08 DIAGNOSIS — E785 Hyperlipidemia, unspecified: Secondary | ICD-10-CM | POA: Diagnosis not present

## 2021-06-08 DIAGNOSIS — S066XAD Traumatic subarachnoid hemorrhage with loss of consciousness status unknown, subsequent encounter: Secondary | ICD-10-CM | POA: Diagnosis not present

## 2021-06-09 ENCOUNTER — Telehealth: Payer: Self-pay | Admitting: Family Medicine

## 2021-06-09 NOTE — Telephone Encounter (Signed)
Patient has started going to the bathroom frequent during the night and it is keeping him from sleeping. He is not having any burning and urine is not cloudy. Please  call back and advise.  ?

## 2021-06-10 ENCOUNTER — Ambulatory Visit (INDEPENDENT_AMBULATORY_CARE_PROVIDER_SITE_OTHER): Admitting: Family Medicine

## 2021-06-10 DIAGNOSIS — N3281 Overactive bladder: Secondary | ICD-10-CM | POA: Diagnosis not present

## 2021-06-10 DIAGNOSIS — C61 Malignant neoplasm of prostate: Secondary | ICD-10-CM

## 2021-06-10 DIAGNOSIS — G4733 Obstructive sleep apnea (adult) (pediatric): Secondary | ICD-10-CM | POA: Diagnosis not present

## 2021-06-10 DIAGNOSIS — E785 Hyperlipidemia, unspecified: Secondary | ICD-10-CM | POA: Diagnosis not present

## 2021-06-10 DIAGNOSIS — I2510 Atherosclerotic heart disease of native coronary artery without angina pectoris: Secondary | ICD-10-CM | POA: Diagnosis not present

## 2021-06-10 DIAGNOSIS — R351 Nocturia: Secondary | ICD-10-CM | POA: Diagnosis not present

## 2021-06-10 DIAGNOSIS — S066XAD Traumatic subarachnoid hemorrhage with loss of consciousness status unknown, subsequent encounter: Secondary | ICD-10-CM | POA: Diagnosis not present

## 2021-06-10 DIAGNOSIS — N184 Chronic kidney disease, stage 4 (severe): Secondary | ICD-10-CM | POA: Diagnosis not present

## 2021-06-10 DIAGNOSIS — I129 Hypertensive chronic kidney disease with stage 1 through stage 4 chronic kidney disease, or unspecified chronic kidney disease: Secondary | ICD-10-CM | POA: Diagnosis not present

## 2021-06-10 MED ORDER — SOLIFENACIN SUCCINATE 5 MG PO TABS: 5.0000 mg | ORAL_TABLET | Freq: Every day | ORAL | 0 refills | Status: DC

## 2021-06-10 NOTE — Telephone Encounter (Signed)
Have him make appt, can be virtual to discuss, I may have him bring in a urine or have a family member bring one in but make him appt ?

## 2021-06-10 NOTE — Telephone Encounter (Signed)
Called and spoke with patient scheduled visit tonight [name] did not have opening  ?

## 2021-06-10 NOTE — Progress Notes (Signed)
Telephone visit ? ?Subjective: ?CC: Dysuria ?PCP: [name], [name], MD ?DTO:IZTIWP [name] is a 76 y.o. male calls for telephone consult today. Patient provides verbal consent for consult held via phone. ? ?Due to COVID-19 pandemic this visit was conducted virtually. This visit type was conducted due to national recommendations for restrictions regarding the COVID-19 Pandemic (e.g. social distancing, sheltering in place) in an effort to limit this patient's exposure and mitigate transmission in our community. All issues noted in this document were discussed and addressed.  A physical exam was not performed with this format.  ? ?Location of patient: home ?Location of provider: [facility] ?Others present for call: none ? ?1. Dysuria ?Patient reports a month history of nocturia. History of prostate cancer s/p radiation treatment.  He apparently has been given only 2 months to live.  He reports going to the restroom at nighttime up to 15 times per night.  No dysuria, hematuria.  He is treated with Flomax. ? ? ?ROS: Per HPI ? ?Allergies  ?Allergen Reactions  ? Carbidopa-Levodopa Other (See Comments)  ?  hallunications  ? Morphine Sulfate Nausea And Vomiting  ? Sulfa Antibiotics Nausea And Vomiting  ? Sulfacetamide Sodium Nausea And Vomiting  ? ?Past Medical History:  ?Diagnosis Date  ? Aortic atherosclerosis ([place])   ? 2004  s/p  closure Penetrating atherosclerotic ulcer of infarenal aorta w/ endovascular stent graft  ? Arthritis   ? CKD (chronic kidney disease), stage III ([place])   ? Closed fracture of head of humerus 07/2017  ? right shoulder from fall; 09-25-2017 per pt no surgical intervention, only wore sling, intermittant pain  ? Coronary atherosclerosis of native coronary artery   ? positive myoview for ischemia 09-27-2009;  10-01-2009 per cardiac cath-- minimal nonobstructive CAD w/ 30% LAD   ? ED (erectile dysfunction)   ? Emphysema/COPD [facility])   ? followed by pcp--- last exacerbation 09-12-2017;  09-25-2017 per pt  no cough, sob or congestion  ? Essential hypertension   ? Family history of kidney cancer   ? First degree heart block   ? Frequency of urination   ? GERD (gastroesophageal reflux disease)   ? Gout   ? 09-25-2017  per pt stable,  last episode 2015  ? Heart murmur   ? History of adenomatous polyp of colon   ? History of closed head injury 2005  ? per pt residual resolved  ? History of external beam radiation therapy   ? completed 10/ 2015 for prostate cancer  ? History of gastric ulcer 2014  ? History of kidney stones   ? History of pneumothorax   ? spontaneous pneumo treated w/ chest tube  ? History of rib fracture 07/2017  ? from fall,  right side 6th,7th,8th  ? Neuropathy   ? OAB (overactive bladder)   ? OSA (obstructive sleep apnea)   ? Intolerant of CPAP  ? Prostate cancer ([place])   ? dx 09-26-2011 via bx-- Stage T3b,N0,  Gleason 4+4, PSA 11.2 with METs to external iliac lymph node(resolved with ADT)-- treated w/ ADT ;   05/2013 staging work-up for rising PSA , started casodex and completed radiation therapy 10/ 2015;   rising PSA post treatment  ? RLS (restless legs syndrome)   ? Ureteral neocystostomy bleed   ? left side  ? Urge urinary incontinence   ? ? ?Current Outpatient Medications:  ?  abiraterone acetate (ZYTIGA) 250 MG tablet, Take 2 tablets (500 mg total) by mouth daily. Take on an empty stomach  1 hour before or 2 hours after a meal, Disp: 120 tablet, Rfl: 3 ?  acetaminophen (TYLENOL) 325 MG tablet, Take 2 tablets (650 mg total) by mouth every 6 (six) hours as needed for mild pain (or Fever >/= 101)., Disp: , Rfl:  ?  albuterol (VENTOLIN HFA) 108 (90 Base) MCG/ACT inhaler, Inhale 1-2 puffs into the lungs every 6 (six) hours as needed for wheezing or shortness of breath., Disp: 1 each, Rfl: 3 ?  benzonatate (TESSALON) 200 MG capsule, Take 1 capsule (200 mg total) by mouth 3 (three) times daily as needed for cough., Disp: 20 capsule, Rfl: 0 ?  carvedilol (COREG) 25 MG tablet, Take 0.5 tablets (12.5 mg  total) by mouth 2 (two) times daily., Disp: 180 tablet, Rfl: 0 ?  cloNIDine (CATAPRES) 0.1 MG tablet, Take 1 tablet (0.1 mg total) by mouth 2 (two) times daily. Take for blood pressure > 180, and make sure u lay flat and get up very slowl if u take this--this can drop ur pressure and cause u to feel dizzy, Disp: 60 tablet, Rfl: 11 ?  DULoxetine (CYMBALTA) 60 MG capsule, Take 1 capsule (60 mg total) by mouth daily. Take 1 capsule by mouth once daily (Needs to be seen before next refill), Disp: 90 capsule, Rfl: 3 ?  levETIRAcetam (KEPPRA) 500 MG tablet, Take 1 tablet (500 mg total) by mouth 2 (two) times daily., Disp: 12 tablet, Rfl: 0 ?  pantoprazole (PROTONIX) 40 MG tablet, TAKE 1 TABLET BY MOUTH TWICE DAILY BEFORE A MEAL, Disp: 180 tablet, Rfl: 0 ?  rosuvastatin (CRESTOR) 20 MG tablet, TAKE 1 TABLET BY MOUTH ONCE DAILY -  STOP  PRAVASTATIN -- NEEDS FOLLOW UP APPOINTMENT FOR REFILLS (Patient taking differently: Take 20 mg by mouth See admin instructions. TAKE 1 TABLET BY MOUTH ONCE DAILY -  STOP  PRAVASTATIN -- NEEDS FOLLOW UP APPOINTMENT FOR REFILLS), Disp: 15 tablet, Rfl: 0 ?  tamsulosin (FLOMAX) 0.4 MG CAPS capsule, Take 1 capsule (0.4 mg total) by mouth daily., Disp: 90 capsule, Rfl: 3 ? ?Assessment/ Plan: ?76 y.o. male  ? ?Nocturia - Plan: solifenacin (VESICARE) 5 MG tablet ? ?Overactive bladder - Plan: solifenacin (VESICARE) 5 MG tablet ? ?Prostate cancer ([place]) - Plan: solifenacin (VESICARE) 5 MG tablet ? ?Question overactive bladder versus complications from treatment for prostate cancer.  He apparently has terminal.  I have added the Vesicare for comfort and possible treatment of overactive bladder since Flomax has been totally ineffective thus far.  We discussed potential side effects.  He will follow-up as needed with PCP ? ?Start time: 12:59pm (no answer after 7 rings); called again at 1:37pm (no answer); 3:20pm ?End time: 3:25pm ? ?Total time spent on patient care (including telephone call/ virtual  visit): 5 minutes ? ?Janora Norlander, DO ?[place] ?[phone] ? ? ?

## 2021-06-15 DIAGNOSIS — N184 Chronic kidney disease, stage 4 (severe): Secondary | ICD-10-CM | POA: Diagnosis not present

## 2021-06-15 DIAGNOSIS — S066XAD Traumatic subarachnoid hemorrhage with loss of consciousness status unknown, subsequent encounter: Secondary | ICD-10-CM | POA: Diagnosis not present

## 2021-06-15 DIAGNOSIS — E785 Hyperlipidemia, unspecified: Secondary | ICD-10-CM | POA: Diagnosis not present

## 2021-06-15 DIAGNOSIS — I2510 Atherosclerotic heart disease of native coronary artery without angina pectoris: Secondary | ICD-10-CM | POA: Diagnosis not present

## 2021-06-15 DIAGNOSIS — G4733 Obstructive sleep apnea (adult) (pediatric): Secondary | ICD-10-CM | POA: Diagnosis not present

## 2021-06-15 DIAGNOSIS — I129 Hypertensive chronic kidney disease with stage 1 through stage 4 chronic kidney disease, or unspecified chronic kidney disease: Secondary | ICD-10-CM | POA: Diagnosis not present

## 2021-06-17 DIAGNOSIS — G4733 Obstructive sleep apnea (adult) (pediatric): Secondary | ICD-10-CM | POA: Diagnosis not present

## 2021-06-17 DIAGNOSIS — I2510 Atherosclerotic heart disease of native coronary artery without angina pectoris: Secondary | ICD-10-CM | POA: Diagnosis not present

## 2021-06-17 DIAGNOSIS — S066XAD Traumatic subarachnoid hemorrhage with loss of consciousness status unknown, subsequent encounter: Secondary | ICD-10-CM | POA: Diagnosis not present

## 2021-06-17 DIAGNOSIS — N184 Chronic kidney disease, stage 4 (severe): Secondary | ICD-10-CM | POA: Diagnosis not present

## 2021-06-17 DIAGNOSIS — E785 Hyperlipidemia, unspecified: Secondary | ICD-10-CM | POA: Diagnosis not present

## 2021-06-17 DIAGNOSIS — I129 Hypertensive chronic kidney disease with stage 1 through stage 4 chronic kidney disease, or unspecified chronic kidney disease: Secondary | ICD-10-CM | POA: Diagnosis not present

## 2021-06-21 DIAGNOSIS — I2510 Atherosclerotic heart disease of native coronary artery without angina pectoris: Secondary | ICD-10-CM | POA: Diagnosis not present

## 2021-06-21 DIAGNOSIS — E785 Hyperlipidemia, unspecified: Secondary | ICD-10-CM | POA: Diagnosis not present

## 2021-06-21 DIAGNOSIS — G4733 Obstructive sleep apnea (adult) (pediatric): Secondary | ICD-10-CM | POA: Diagnosis not present

## 2021-06-21 DIAGNOSIS — I129 Hypertensive chronic kidney disease with stage 1 through stage 4 chronic kidney disease, or unspecified chronic kidney disease: Secondary | ICD-10-CM | POA: Diagnosis not present

## 2021-06-21 DIAGNOSIS — S066XAD Traumatic subarachnoid hemorrhage with loss of consciousness status unknown, subsequent encounter: Secondary | ICD-10-CM | POA: Diagnosis not present

## 2021-06-21 DIAGNOSIS — N184 Chronic kidney disease, stage 4 (severe): Secondary | ICD-10-CM | POA: Diagnosis not present

## 2021-06-22 DIAGNOSIS — E785 Hyperlipidemia, unspecified: Secondary | ICD-10-CM | POA: Diagnosis not present

## 2021-06-22 DIAGNOSIS — G4733 Obstructive sleep apnea (adult) (pediatric): Secondary | ICD-10-CM | POA: Diagnosis not present

## 2021-06-22 DIAGNOSIS — N184 Chronic kidney disease, stage 4 (severe): Secondary | ICD-10-CM | POA: Diagnosis not present

## 2021-06-22 DIAGNOSIS — I2510 Atherosclerotic heart disease of native coronary artery without angina pectoris: Secondary | ICD-10-CM | POA: Diagnosis not present

## 2021-06-22 DIAGNOSIS — S066XAD Traumatic subarachnoid hemorrhage with loss of consciousness status unknown, subsequent encounter: Secondary | ICD-10-CM | POA: Diagnosis not present

## 2021-06-22 DIAGNOSIS — I129 Hypertensive chronic kidney disease with stage 1 through stage 4 chronic kidney disease, or unspecified chronic kidney disease: Secondary | ICD-10-CM | POA: Diagnosis not present

## 2021-06-27 ENCOUNTER — Other Ambulatory Visit: Payer: Self-pay | Admitting: Family Medicine

## 2021-06-27 DIAGNOSIS — H938X1 Other specified disorders of right ear: Secondary | ICD-10-CM

## 2021-06-27 DIAGNOSIS — R42 Dizziness and giddiness: Secondary | ICD-10-CM

## 2021-06-28 ENCOUNTER — Other Ambulatory Visit: Payer: Self-pay

## 2021-06-28 ENCOUNTER — Encounter (HOSPITAL_COMMUNITY): Payer: Self-pay

## 2021-06-28 ENCOUNTER — Emergency Department (HOSPITAL_COMMUNITY)

## 2021-06-28 ENCOUNTER — Inpatient Hospital Stay (HOSPITAL_COMMUNITY)
Admission: EM | Admit: 2021-06-28 | Discharge: 2021-07-02 | DRG: 689 | Disposition: A | Discharge status: Hospice | Source: Hospice | Attending: Family Medicine | Admitting: Family Medicine

## 2021-06-28 DIAGNOSIS — J449 Chronic obstructive pulmonary disease, unspecified: Secondary | ICD-10-CM | POA: Diagnosis present

## 2021-06-28 DIAGNOSIS — M109 Gout, unspecified: Secondary | ICD-10-CM | POA: Diagnosis present

## 2021-06-28 DIAGNOSIS — N179 Acute kidney failure, unspecified: Secondary | ICD-10-CM | POA: Diagnosis present

## 2021-06-28 DIAGNOSIS — E785 Hyperlipidemia, unspecified: Secondary | ICD-10-CM | POA: Diagnosis present

## 2021-06-28 DIAGNOSIS — R410 Disorientation, unspecified: Secondary | ICD-10-CM | POA: Diagnosis not present

## 2021-06-28 DIAGNOSIS — N3001 Acute cystitis with hematuria: Principal | ICD-10-CM | POA: Diagnosis present

## 2021-06-28 DIAGNOSIS — I959 Hypotension, unspecified: Secondary | ICD-10-CM | POA: Diagnosis not present

## 2021-06-28 DIAGNOSIS — N184 Chronic kidney disease, stage 4 (severe): Secondary | ICD-10-CM | POA: Diagnosis present

## 2021-06-28 DIAGNOSIS — Z8249 Family history of ischemic heart disease and other diseases of the circulatory system: Secondary | ICD-10-CM

## 2021-06-28 DIAGNOSIS — C61 Malignant neoplasm of prostate: Secondary | ICD-10-CM | POA: Diagnosis present

## 2021-06-28 DIAGNOSIS — Z885 Allergy status to narcotic agent status: Secondary | ICD-10-CM

## 2021-06-28 DIAGNOSIS — Z79899 Other long term (current) drug therapy: Secondary | ICD-10-CM

## 2021-06-28 DIAGNOSIS — I2510 Atherosclerotic heart disease of native coronary artery without angina pectoris: Secondary | ICD-10-CM | POA: Diagnosis not present

## 2021-06-28 DIAGNOSIS — F03C0 Unspecified dementia, severe, without behavioral disturbance, psychotic disturbance, mood disturbance, and anxiety: Secondary | ICD-10-CM | POA: Diagnosis present

## 2021-06-28 DIAGNOSIS — F17210 Nicotine dependence, cigarettes, uncomplicated: Secondary | ICD-10-CM | POA: Diagnosis present

## 2021-06-28 DIAGNOSIS — E669 Obesity, unspecified: Secondary | ICD-10-CM | POA: Diagnosis present

## 2021-06-28 DIAGNOSIS — S066XAD Traumatic subarachnoid hemorrhage with loss of consciousness status unknown, subsequent encounter: Secondary | ICD-10-CM | POA: Diagnosis not present

## 2021-06-28 DIAGNOSIS — Z96651 Presence of right artificial knee joint: Secondary | ICD-10-CM | POA: Diagnosis present

## 2021-06-28 DIAGNOSIS — F05 Delirium due to known physiological condition: Secondary | ICD-10-CM | POA: Diagnosis not present

## 2021-06-28 DIAGNOSIS — G4733 Obstructive sleep apnea (adult) (pediatric): Secondary | ICD-10-CM | POA: Diagnosis not present

## 2021-06-28 DIAGNOSIS — S2242XA Multiple fractures of ribs, left side, initial encounter for closed fracture: Secondary | ICD-10-CM | POA: Diagnosis not present

## 2021-06-28 DIAGNOSIS — Z888 Allergy status to other drugs, medicaments and biological substances status: Secondary | ICD-10-CM

## 2021-06-28 DIAGNOSIS — N400 Enlarged prostate without lower urinary tract symptoms: Secondary | ICD-10-CM | POA: Diagnosis present

## 2021-06-28 DIAGNOSIS — Z66 Do not resuscitate: Secondary | ICD-10-CM | POA: Diagnosis present

## 2021-06-28 DIAGNOSIS — Z0389 Encounter for observation for other suspected diseases and conditions ruled out: Secondary | ICD-10-CM | POA: Diagnosis not present

## 2021-06-28 DIAGNOSIS — I129 Hypertensive chronic kidney disease with stage 1 through stage 4 chronic kidney disease, or unspecified chronic kidney disease: Secondary | ICD-10-CM | POA: Diagnosis not present

## 2021-06-28 DIAGNOSIS — R296 Repeated falls: Secondary | ICD-10-CM | POA: Diagnosis present

## 2021-06-28 DIAGNOSIS — N390 Urinary tract infection, site not specified: Secondary | ICD-10-CM | POA: Diagnosis present

## 2021-06-28 DIAGNOSIS — R4182 Altered mental status, unspecified: Secondary | ICD-10-CM | POA: Diagnosis not present

## 2021-06-28 DIAGNOSIS — Z515 Encounter for palliative care: Secondary | ICD-10-CM

## 2021-06-28 DIAGNOSIS — E860 Dehydration: Secondary | ICD-10-CM | POA: Diagnosis present

## 2021-06-28 DIAGNOSIS — I10 Essential (primary) hypertension: Secondary | ICD-10-CM | POA: Diagnosis present

## 2021-06-28 DIAGNOSIS — J439 Emphysema, unspecified: Secondary | ICD-10-CM | POA: Diagnosis present

## 2021-06-28 DIAGNOSIS — R9431 Abnormal electrocardiogram [ECG] [EKG]: Secondary | ICD-10-CM | POA: Diagnosis not present

## 2021-06-28 DIAGNOSIS — Z882 Allergy status to sulfonamides status: Secondary | ICD-10-CM

## 2021-06-28 DIAGNOSIS — Z83438 Family history of other disorder of lipoprotein metabolism and other lipidemia: Secondary | ICD-10-CM

## 2021-06-28 DIAGNOSIS — D630 Anemia in neoplastic disease: Secondary | ICD-10-CM | POA: Diagnosis present

## 2021-06-28 DIAGNOSIS — Z923 Personal history of irradiation: Secondary | ICD-10-CM

## 2021-06-28 DIAGNOSIS — Z6835 Body mass index (BMI) 35.0-35.9, adult: Secondary | ICD-10-CM

## 2021-06-28 DIAGNOSIS — E222 Syndrome of inappropriate secretion of antidiuretic hormone: Secondary | ICD-10-CM | POA: Diagnosis present

## 2021-06-28 DIAGNOSIS — R569 Unspecified convulsions: Secondary | ICD-10-CM | POA: Diagnosis present

## 2021-06-28 DIAGNOSIS — E871 Hypo-osmolality and hyponatremia: Secondary | ICD-10-CM | POA: Diagnosis present

## 2021-06-28 DIAGNOSIS — G9341 Metabolic encephalopathy: Secondary | ICD-10-CM | POA: Diagnosis present

## 2021-06-28 DIAGNOSIS — K219 Gastro-esophageal reflux disease without esophagitis: Secondary | ICD-10-CM | POA: Diagnosis present

## 2021-06-28 NOTE — ED Triage Notes (Addendum)
[facility]- called out for altered mental status x few days. Pt is cancer pt on hospice. Pt fell out of wheelchair today per ems but do not know what pt hit. Pt wife thinks pt has UTI due to smell and color of urine. Pt usually A&O x4. ? ?Pt in and out of orientation and can only understand every few words. ?

## 2021-06-28 NOTE — ED Provider Notes (Signed)
?[facility] ?Provider Note ? ? ?CSN: [id number] ?Arrival date & time: 06/28/21  2323 ? ?  ? ?History ? ?Chief Complaint  ?Patient presents with  ? Altered Mental Status  ? ?Level 5 caveat due to altered mental status ?[name] is a 76 y.o. male. ? ? ?Altered Mental Status ? ?Patient arrives via EMS.  There is no family to confirm the history at this time.  This report the patient's had meant altered mental status for several days.  It is reported that he may have fallen recently.  Is also reported that he may have a UTI.  It is reported patient is on hospice. ?Patient is unable to provide any history ? ?Home Medications ?Prior to Admission medications   ?Medication Sig Start Date End Date Taking? Authorizing Provider  ?abiraterone acetate (ZYTIGA) 250 MG tablet Take 2 tablets (500 mg total) by mouth daily. Take on an empty stomach 1 hour before or 2 hours after a meal 10/27/20   Heath Lark, MD  ?acetaminophen (TYLENOL) 325 MG tablet Take 2 tablets (650 mg total) by mouth every 6 (six) hours as needed for mild pain (or Fever >/= 101). 02/25/21   Nita Sells, MD  ?albuterol (VENTOLIN HFA) 108 (90 Base) MCG/ACT inhaler Inhale 1-2 puffs into the lungs every 6 (six) hours as needed for wheezing or shortness of breath. 02/06/21   Tonye Pearson, PA-C  ?benzonatate (TESSALON) 200 MG capsule Take 1 capsule (200 mg total) by mouth 3 (three) times daily as needed for cough. 03/05/21   Sidney Ace, MD  ?carvedilol (COREG) 25 MG tablet Take 0.5 tablets (12.5 mg total) by mouth 2 (two) times daily. 02/25/21   Nita Sells, MD  ?cloNIDine (CATAPRES) 0.1 MG tablet Take 1 tablet (0.1 mg total) by mouth 2 (two) times daily. Take for blood pressure > 180, and make sure u lay flat and get up very slowl if u take this--this can drop ur pressure and cause u to feel dizzy 02/25/21 02/25/22  Nita Sells, MD  ?DULoxetine (CYMBALTA) 60 MG capsule Take 1 capsule (60 mg total) by  mouth daily. Take 1 capsule by mouth once daily (Needs to be seen before next refill) 05/25/21   [name], [name], MD  ?levETIRAcetam (KEPPRA) 500 MG tablet Take 1 tablet (500 mg total) by mouth 2 (two) times daily. 02/25/21   Nita Sells, MD  ?pantoprazole (PROTONIX) 40 MG tablet TAKE 1 TABLET BY MOUTH TWICE DAILY BEFORE A MEAL 01/12/21   Gwenlyn Perking, FNP  ?rosuvastatin (CRESTOR) 20 MG tablet TAKE 1 TABLET BY MOUTH ONCE DAILY -  STOP  PRAVASTATIN -- NEEDS FOLLOW UP APPOINTMENT FOR REFILLS ?Patient taking differently: Take 20 mg by mouth See admin instructions. TAKE 1 TABLET BY MOUTH ONCE DAILY -  STOP  PRAVASTATIN -- NEEDS FOLLOW UP APPOINTMENT FOR REFILLS 09/30/20   Satira Sark, MD  ?solifenacin (VESICARE) 5 MG tablet Take 1 tablet (5 mg total) by mouth daily. For overactive bladder 06/10/21   Ronnie Doss M, DO  ?tamsulosin (FLOMAX) 0.4 MG CAPS capsule Take 1 capsule (0.4 mg total) by mouth daily. 05/25/21   [name], [name], MD  ?   ? ?Allergies    ?Carbidopa-levodopa, Morphine sulfate, Sulfa antibiotics, and Sulfacetamide sodium   ?Social History  ? ?Socioeconomic History  ? Marital status: Married  ?  Spouse name: Not on file  ? Number of children: 3  ? Years of education: 88  ? Highest education level:  Some college, no degree  ?Occupational History  ? Occupation: Retired  ?  Comment: [employer]  ?  Comment: Part Time at [employer]  ?Tobacco Use  ? Smoking status: Every Day  ?  Packs/day: 0.50  ?  Years: 42.00  ?  Pack years: 21.00  ?  Types: Cigarettes  ? Smokeless tobacco: Never  ?Vaping Use  ? Vaping Use: Never used  ?Substance and Sexual Activity  ? Alcohol use: No  ?  Alcohol/week: 0.0 standard drinks  ? Drug use: No  ? Sexual activity: Yes  ?  Birth control/protection: None  ?Other Topics Concern  ? Not on file  ?Social History Narrative  ? Patient is retired from the [employer]. He lives in a one story home with his wife. He moved from [place] [facility] about 9  years ago. They do not have family on the [place] and although he is very outgoing he feels socially isolated. He isn't a member of any groups or churches.   ? ?Social Determinants of Health  ? ?Financial Resource Strain: Medium Risk  ? Difficulty of Paying Living Expenses: Somewhat hard  ?Food Insecurity: No Food Insecurity  ? Worried About Charity fundraiser in the Last Year: Never true  ? Ran Out of Food in the Last Year: Never true  ?Transportation Needs: No Transportation Needs  ? Lack of Transportation (Medical): No  ? Lack of Transportation (Non-Medical): No  ?Physical Activity: Inactive  ? Days of Exercise per Week: 0 days  ? Minutes of Exercise per Session: 0 min  ?Stress: Stress Concern Present  ? Feeling of Stress : To some extent  ?Social Connections: Moderately Isolated  ? Frequency of Communication with Friends and Family: More than three times a week  ? Frequency of Social Gatherings with Friends and Family: More than three times a week  ? Attends Religious Services: Never  ? Active Member of Clubs or Organizations: No  ? Attends Archivist Meetings: Never  ? Marital Status: Married  ?Intimate Partner Violence: Not At Risk  ? Fear of Current or Ex-Partner: No  ? Emotionally Abused: No  ? Physically Abused: No  ? Sexually Abused: No  ? ? ?Review of Systems   ?Review of Systems  ?Unable to perform ROS: Mental status change  ? ?Physical Exam ?Updated Vital Signs ?BP 125/77   Pulse 70   Temp 98.2 ?F (36.8 ?C) (Rectal)   Resp 18   Ht 1.702 m ('5\' 7"'$ )   Wt 104 kg   SpO2 96%   BMI 35.91 kg/m?  ?Physical Exam ?CONSTITUTIONAL: Elderly, ill-appearing, disheveled ?HEAD: Normocephalic/atraumatic ?EYES: EOMI/PERRL, no nystagmus ?ENMT: Mucous membranes dry, no oral swelling, no stridor, no drooling, no obvious dental abscess ?NECK: supple no meningeal signs ?SPINE/BACK:entire spine nontender ?CV: S1/S2 noted, no murmurs/rubs/gallops noted ?LUNGS: Lungs are clear to auscultation bilaterally, no  apparent distress ?ABDOMEN: soft, nondistended ?GU: Wearing a diaper ?NEURO: Pt is somnolent, but arousable to voice.  He is unable to follow commands.  He moves all 4 extremities.  He speaks incoherently during exam ?EXTREMITIES: pulses normal/equal, full ROM, no signs of trauma, no deformities are noted ?SKIN: warm, color normal ?PSYCH: Unable to assess ?ED Results / Procedures / Treatments   ?Labs ?(all labs ordered are listed, but only abnormal results are displayed) ?Labs Reviewed  ?COMPREHENSIVE METABOLIC PANEL - Abnormal; Notable for the following components:  ?    Result Value  ? Sodium 132 (*)   ?  Glucose, Bld 113 (*)   ? BUN 46 (*)   ? Creatinine, Ser 3.99 (*)   ? Calcium 8.5 (*)   ? GFR, Estimated 15 (*)   ? All other components within normal limits  ?CBC WITH DIFFERENTIAL/PLATELET - Abnormal; Notable for the following components:  ? RBC 3.36 (*)   ? Hemoglobin 10.2 (*)   ? HCT 31.5 (*)   ? Lymphs Abs 0.6 (*)   ? All other components within normal limits  ?APTT - Abnormal; Notable for the following components:  ? aPTT 44 (*)   ? All other components within normal limits  ?URINALYSIS, ROUTINE W REFLEX MICROSCOPIC - Abnormal; Notable for the following components:  ? APPearance CLOUDY (*)   ? Hgb urine dipstick SMALL (*)   ? Protein, ur 100 (*)   ? Leukocytes,Ua LARGE (*)   ? WBC, UA >50 (*)   ? Bacteria, UA MANY (*)   ? All other components within normal limits  ?CULTURE, BLOOD (ROUTINE X 2)  ?CULTURE, BLOOD (ROUTINE X 2)  ?LACTIC ACID, PLASMA  ?PROTIME-INR  ? ? ?EKG ?EKG Interpretation ? ?Date/Time:  Tuesday June 28 2021 23:40:16 EDT ?Ventricular Rate:  65 ?PR Interval:  271 ?QRS Duration: 111 ?QT Interval:  471 ?QTC Calculation: 490 ?R Axis:   -51 ?Text Interpretation: Sinus or ectopic atrial rhythm Prolonged PR interval Left anterior fascicular block Abnormal R-wave progression, late transition Left ventricular hypertrophy Borderline T abnormalities, inferior leads Borderline prolonged QT interval No  significant change since last tracing Confirmed by Ripley Fraise [phone]) on 06/28/2021 11:55:41 PM ? ?Radiology ?CT Head Wo Contrast ? ?Result Date: 06/29/2021 ?CLINICAL DATA:  Altered mental status for few days

## 2021-06-28 NOTE — ED Notes (Signed)
To CT

## 2021-06-29 ENCOUNTER — Inpatient Hospital Stay (HOSPITAL_COMMUNITY)

## 2021-06-29 DIAGNOSIS — E782 Mixed hyperlipidemia: Secondary | ICD-10-CM | POA: Diagnosis not present

## 2021-06-29 DIAGNOSIS — S066XAD Traumatic subarachnoid hemorrhage with loss of consciousness status unknown, subsequent encounter: Secondary | ICD-10-CM | POA: Diagnosis not present

## 2021-06-29 DIAGNOSIS — R296 Repeated falls: Secondary | ICD-10-CM | POA: Diagnosis not present

## 2021-06-29 DIAGNOSIS — C61 Malignant neoplasm of prostate: Secondary | ICD-10-CM | POA: Diagnosis not present

## 2021-06-29 DIAGNOSIS — R4182 Altered mental status, unspecified: Secondary | ICD-10-CM | POA: Diagnosis not present

## 2021-06-29 DIAGNOSIS — R410 Disorientation, unspecified: Secondary | ICD-10-CM | POA: Diagnosis not present

## 2021-06-29 DIAGNOSIS — N3001 Acute cystitis with hematuria: Secondary | ICD-10-CM | POA: Diagnosis not present

## 2021-06-29 DIAGNOSIS — E871 Hypo-osmolality and hyponatremia: Secondary | ICD-10-CM

## 2021-06-29 DIAGNOSIS — N390 Urinary tract infection, site not specified: Secondary | ICD-10-CM | POA: Diagnosis not present

## 2021-06-29 DIAGNOSIS — G9341 Metabolic encephalopathy: Secondary | ICD-10-CM | POA: Diagnosis present

## 2021-06-29 DIAGNOSIS — E785 Hyperlipidemia, unspecified: Secondary | ICD-10-CM | POA: Diagnosis not present

## 2021-06-29 DIAGNOSIS — N179 Acute kidney failure, unspecified: Secondary | ICD-10-CM | POA: Diagnosis present

## 2021-06-29 DIAGNOSIS — R569 Unspecified convulsions: Secondary | ICD-10-CM

## 2021-06-29 DIAGNOSIS — E860 Dehydration: Secondary | ICD-10-CM | POA: Diagnosis not present

## 2021-06-29 DIAGNOSIS — G4733 Obstructive sleep apnea (adult) (pediatric): Secondary | ICD-10-CM | POA: Diagnosis not present

## 2021-06-29 DIAGNOSIS — I10 Essential (primary) hypertension: Secondary | ICD-10-CM | POA: Diagnosis not present

## 2021-06-29 DIAGNOSIS — I440 Atrioventricular block, first degree: Secondary | ICD-10-CM | POA: Diagnosis not present

## 2021-06-29 DIAGNOSIS — I714 Abdominal aortic aneurysm, without rupture, unspecified: Secondary | ICD-10-CM | POA: Diagnosis not present

## 2021-06-29 DIAGNOSIS — K219 Gastro-esophageal reflux disease without esophagitis: Secondary | ICD-10-CM

## 2021-06-29 DIAGNOSIS — F03C0 Unspecified dementia, severe, without behavioral disturbance, psychotic disturbance, mood disturbance, and anxiety: Secondary | ICD-10-CM | POA: Diagnosis present

## 2021-06-29 DIAGNOSIS — J449 Chronic obstructive pulmonary disease, unspecified: Secondary | ICD-10-CM | POA: Diagnosis not present

## 2021-06-29 DIAGNOSIS — I129 Hypertensive chronic kidney disease with stage 1 through stage 4 chronic kidney disease, or unspecified chronic kidney disease: Secondary | ICD-10-CM | POA: Diagnosis not present

## 2021-06-29 DIAGNOSIS — Z7189 Other specified counseling: Secondary | ICD-10-CM | POA: Diagnosis not present

## 2021-06-29 DIAGNOSIS — K111 Hypertrophy of salivary gland: Secondary | ICD-10-CM | POA: Diagnosis not present

## 2021-06-29 DIAGNOSIS — Z6835 Body mass index (BMI) 35.0-35.9, adult: Secondary | ICD-10-CM | POA: Diagnosis not present

## 2021-06-29 DIAGNOSIS — Z66 Do not resuscitate: Secondary | ICD-10-CM | POA: Diagnosis not present

## 2021-06-29 DIAGNOSIS — M1990 Unspecified osteoarthritis, unspecified site: Secondary | ICD-10-CM | POA: Diagnosis not present

## 2021-06-29 DIAGNOSIS — F17210 Nicotine dependence, cigarettes, uncomplicated: Secondary | ICD-10-CM | POA: Diagnosis not present

## 2021-06-29 DIAGNOSIS — N189 Chronic kidney disease, unspecified: Secondary | ICD-10-CM

## 2021-06-29 DIAGNOSIS — Z515 Encounter for palliative care: Secondary | ICD-10-CM | POA: Diagnosis not present

## 2021-06-29 DIAGNOSIS — M109 Gout, unspecified: Secondary | ICD-10-CM | POA: Diagnosis present

## 2021-06-29 DIAGNOSIS — Z882 Allergy status to sulfonamides status: Secondary | ICD-10-CM | POA: Diagnosis not present

## 2021-06-29 DIAGNOSIS — R590 Localized enlarged lymph nodes: Secondary | ICD-10-CM | POA: Diagnosis not present

## 2021-06-29 DIAGNOSIS — Z8616 Personal history of COVID-19: Secondary | ICD-10-CM | POA: Diagnosis not present

## 2021-06-29 DIAGNOSIS — N184 Chronic kidney disease, stage 4 (severe): Secondary | ICD-10-CM | POA: Diagnosis not present

## 2021-06-29 DIAGNOSIS — E222 Syndrome of inappropriate secretion of antidiuretic hormone: Secondary | ICD-10-CM | POA: Diagnosis present

## 2021-06-29 DIAGNOSIS — Z885 Allergy status to narcotic agent status: Secondary | ICD-10-CM | POA: Diagnosis not present

## 2021-06-29 DIAGNOSIS — D630 Anemia in neoplastic disease: Secondary | ICD-10-CM | POA: Diagnosis present

## 2021-06-29 DIAGNOSIS — N400 Enlarged prostate without lower urinary tract symptoms: Secondary | ICD-10-CM | POA: Diagnosis present

## 2021-06-29 DIAGNOSIS — R634 Abnormal weight loss: Secondary | ICD-10-CM | POA: Diagnosis not present

## 2021-06-29 DIAGNOSIS — J439 Emphysema, unspecified: Secondary | ICD-10-CM | POA: Diagnosis present

## 2021-06-29 DIAGNOSIS — E669 Obesity, unspecified: Secondary | ICD-10-CM | POA: Diagnosis not present

## 2021-06-29 DIAGNOSIS — Z888 Allergy status to other drugs, medicaments and biological substances status: Secondary | ICD-10-CM | POA: Diagnosis not present

## 2021-06-29 DIAGNOSIS — F32A Depression, unspecified: Secondary | ICD-10-CM | POA: Diagnosis not present

## 2021-06-29 DIAGNOSIS — I2510 Atherosclerotic heart disease of native coronary artery without angina pectoris: Secondary | ICD-10-CM | POA: Diagnosis not present

## 2021-06-29 LAB — CBC WITH DIFFERENTIAL/PLATELET
Abs Immature Granulocytes: 0.04 10*3/uL (ref 0.00–0.07)
Basophils Absolute: 0 10*3/uL (ref 0.0–0.1)
Basophils Relative: 1 %
Eosinophils Absolute: 0.2 10*3/uL (ref 0.0–0.5)
Eosinophils Relative: 2 %
HCT: 31.5 % — ABNORMAL LOW (ref 39.0–52.0)
Hemoglobin: 10.2 g/dL — ABNORMAL LOW (ref 13.0–17.0)
Immature Granulocytes: 1 %
Lymphocytes Relative: 7 %
Lymphs Abs: 0.6 10*3/uL — ABNORMAL LOW (ref 0.7–4.0)
MCH: 30.4 pg (ref 26.0–34.0)
MCHC: 32.4 g/dL (ref 30.0–36.0)
MCV: 93.8 fL (ref 80.0–100.0)
Monocytes Absolute: 0.6 10*3/uL (ref 0.1–1.0)
Monocytes Relative: 7 %
Neutro Abs: 6.8 10*3/uL (ref 1.7–7.7)
Neutrophils Relative %: 82 %
Platelets: 212 10*3/uL (ref 150–400)
RBC: 3.36 MIL/uL — ABNORMAL LOW (ref 4.22–5.81)
RDW: 13.9 % (ref 11.5–15.5)
WBC: 8.3 10*3/uL (ref 4.0–10.5)
nRBC: 0 % (ref 0.0–0.2)

## 2021-06-29 LAB — BLOOD CULTURE ID PANEL (REFLEXED) - BCID2

## 2021-06-29 LAB — COMPREHENSIVE METABOLIC PANEL
ALT: 13 U/L (ref 0–44)
AST: 18 U/L (ref 15–41)
Albumin: 3.6 g/dL (ref 3.5–5.0)
Alkaline Phosphatase: 60 U/L (ref 38–126)
Anion gap: 9 (ref 5–15)
BUN: 46 mg/dL — ABNORMAL HIGH (ref 8–23)
CO2: 23 mmol/L (ref 22–32)
Calcium: 8.5 mg/dL — ABNORMAL LOW (ref 8.9–10.3)
Chloride: 100 mmol/L (ref 98–111)
Creatinine, Ser: 3.99 mg/dL — ABNORMAL HIGH (ref 0.61–1.24)
GFR, Estimated: 15 mL/min — ABNORMAL LOW (ref >60)
Glucose, Bld: 113 mg/dL — ABNORMAL HIGH (ref 70–99)
Potassium: 5.1 mmol/L (ref 3.5–5.1)
Sodium: 132 mmol/L — ABNORMAL LOW (ref 135–145)
Total Bilirubin: 0.8 mg/dL (ref 0.3–1.2)
Total Protein: 7.6 g/dL (ref 6.5–8.1)

## 2021-06-29 LAB — URINALYSIS, ROUTINE W REFLEX MICROSCOPIC
Bilirubin Urine: NEGATIVE
Glucose, UA: NEGATIVE mg/dL
Ketones, ur: NEGATIVE mg/dL
Nitrite: NEGATIVE
Protein, ur: 100 mg/dL — AB
Specific Gravity, Urine: 1.018 (ref 1.005–1.030)
WBC, UA: >50 WBC/hpf — ABNORMAL HIGH (ref 0–5)
pH: 5 (ref 5.0–8.0)

## 2021-06-29 LAB — PROTIME-INR
INR: 1.2 (ref 0.8–1.2)
Prothrombin Time: 15.1 seconds (ref 11.4–15.2)

## 2021-06-29 LAB — MAGNESIUM: Magnesium: 2.4 mg/dL (ref 1.7–2.4)

## 2021-06-29 LAB — PHOSPHORUS: Phosphorus: 4.9 mg/dL — ABNORMAL HIGH (ref 2.5–4.6)

## 2021-06-29 LAB — LACTIC ACID, PLASMA: Lactic Acid, Venous: 0.6 mmol/L (ref 0.5–1.9)

## 2021-06-29 LAB — APTT: aPTT: 44 seconds — ABNORMAL HIGH (ref 24–36)

## 2021-06-29 MED ORDER — CEFTRIAXONE SODIUM 1 G IJ SOLR
1.0000 g | Freq: Once | INTRAMUSCULAR | Status: AC
Start: 2021-06-29 — End: 2021-06-29
  Administered 2021-06-29: 1 g via INTRAVENOUS
  Filled 2021-06-29: qty 10

## 2021-06-29 MED ORDER — PANTOPRAZOLE SODIUM 40 MG IV SOLR
40.0000 mg | INTRAVENOUS | Status: DC
  Administered 2021-06-29 – 2021-07-01 (×3): 40 mg via INTRAVENOUS
  Filled 2021-06-29 (×3): qty 10

## 2021-06-29 MED ORDER — SODIUM CHLORIDE 0.9 % IV SOLN: INTRAVENOUS | Status: DC

## 2021-06-29 MED ORDER — ORAL CARE MOUTH RINSE
15.0000 mL | Freq: Two times a day (BID) | OROMUCOSAL | Status: DC
  Administered 2021-06-29 – 2021-07-02 (×6): 15 mL via OROMUCOSAL

## 2021-06-29 MED ORDER — DOXYCYCLINE HYCLATE 100 MG PO TABS
100.0000 mg | ORAL_TABLET | Freq: Two times a day (BID) | ORAL | Status: DC
  Administered 2021-06-29 – 2021-07-01 (×4): 100 mg via ORAL
  Filled 2021-06-29 (×5): qty 1

## 2021-06-29 MED ORDER — CARVEDILOL 12.5 MG PO TABS
12.5000 mg | ORAL_TABLET | Freq: Two times a day (BID) | ORAL | Status: DC
  Administered 2021-06-29 – 2021-07-01 (×4): 12.5 mg via ORAL
  Filled 2021-06-29 (×6): qty 1

## 2021-06-29 MED ORDER — HYDRALAZINE HCL 20 MG/ML IJ SOLN: 10.0000 mg | Freq: Four times a day (QID) | INTRAMUSCULAR | Status: DC | PRN

## 2021-06-29 MED ORDER — HALOPERIDOL LACTATE 5 MG/ML IJ SOLN
3.0000 mg | Freq: Four times a day (QID) | INTRAMUSCULAR | Status: DC | PRN
  Administered 2021-06-29: 3 mg via INTRAVENOUS
  Filled 2021-06-29: qty 1

## 2021-06-29 MED ORDER — LORAZEPAM 1 MG PO TABS
1.0000 mg | ORAL_TABLET | Freq: Three times a day (TID) | ORAL | Status: DC | PRN
  Administered 2021-06-29: 1 mg via ORAL
  Filled 2021-06-29: qty 1

## 2021-06-29 MED ORDER — ROSUVASTATIN CALCIUM 20 MG PO TABS
20.0000 mg | ORAL_TABLET | Freq: Every day | ORAL | Status: DC
  Administered 2021-06-29 – 2021-07-01 (×3): 20 mg via ORAL
  Filled 2021-06-29 (×4): qty 1

## 2021-06-29 MED ORDER — SODIUM CHLORIDE 0.9 % IV SOLN
1.0000 g | INTRAVENOUS | Status: DC
  Administered 2021-06-30 – 2021-07-01 (×2): 1 g via INTRAVENOUS
  Filled 2021-06-29 (×2): qty 10

## 2021-06-29 MED ORDER — ENOXAPARIN SODIUM 30 MG/0.3ML IJ SOSY
30.0000 mg | PREFILLED_SYRINGE | INTRAMUSCULAR | Status: DC
  Administered 2021-06-29 – 2021-07-01 (×3): 30 mg via SUBCUTANEOUS
  Filled 2021-06-29 (×3): qty 0.3

## 2021-06-29 MED ORDER — LEVETIRACETAM IN NACL 500 MG/100ML IV SOLN
500.0000 mg | Freq: Two times a day (BID) | INTRAVENOUS | Status: AC
  Administered 2021-06-29 – 2021-06-30 (×4): 500 mg via INTRAVENOUS
  Filled 2021-06-29 (×4): qty 100

## 2021-06-29 MED ORDER — PHENOL 1.4 % MT LIQD
1.0000 | OROMUCOSAL | Status: DC | PRN
  Administered 2021-06-29: 1 via OROMUCOSAL
  Filled 2021-06-29: qty 177

## 2021-06-29 MED ORDER — ACETAMINOPHEN 325 MG PO TABS: 650.0000 mg | ORAL_TABLET | Freq: Four times a day (QID) | ORAL | Status: DC | PRN

## 2021-06-29 MED ORDER — LACTATED RINGERS IV BOLUS
1000.0000 mL | Freq: Once | INTRAVENOUS | Status: AC
  Administered 2021-06-29: 1000 mL via INTRAVENOUS

## 2021-06-29 NOTE — Evaluation (Signed)
Occupational Therapy Evaluation ?Patient Details ?Name: [name] ?MRN: [id number] ?DOB: 02-11-1946 ?Today's Date: 06/29/2021 ? ? ?History of Present Illness [name] is a 76 y.o. male with medical history significant for   Hypertension, depression, GERD, BPH, history of frequent falls,  prostate cancer on hospice and chronic kidney disease stage IV who presents to the emergency department via EMS due to several day onset of altered mental status.  Patient was unable to provide history possibly due to altered mental status, history was obtained from ED physician and ED medical record.  Per report, patient was reported to have fallen out of wheelchair today and was suspected to have UTI due to stronger odor and color of urine.  At baseline, patient was usually alert and oriented x4. (Per MD note)  ? ?Clinical Impression ?  ?Pt agreeable to OT evaluation. Pt's baseline cognition unknown. Pt demonstrates fair sitting balance with need for physical assist for bed mobility. Pt unable to attempt standing with increased anxiety when preparing to stand. Max A needed to squat and scoot to head of bed. Pt attempted donning socks but required assist. Pt true baseline is unknown due to pt appearing slightly confused via [place] airport. Pt left in bed with chair alarm on and remote within reach. Pt will benefit from continued OT in the hospital and recommended venue below to increase strength, balance, and endurance for safe ADL's.  ? ? ?   ? ?Recommendations for follow up therapy are one component of a multi-disciplinary discharge planning process, led by the attending physician.  Recommendations may be updated based on patient status, additional functional criteria and insurance authorization.  ? ?Follow Up Recommendations ? Skilled nursing-short term rehab (<3 hours/day)  ?  ?Assistance Recommended at Discharge Frequent or constant Supervision/Assistance  ?Patient can return home with the  following A lot of help with walking and/or transfers;A lot of help with bathing/dressing/bathroom;Assistance with cooking/housework;Direct supervision/assist for medications management;Assist for transportation;Help with stairs or ramp for entrance ? ?  ?Functional Status Assessment ? Patient has had a recent decline in their functional status and demonstrates the ability to make significant improvements in function in a reasonable and predictable amount of time.  ?Equipment Recommendations ? None recommended by OT  ?  ?Recommendations for Other Services   ? ? ?  ?Precautions / Restrictions Precautions ?Precautions: Fall ?Restrictions ?Weight Bearing Restrictions: No  ? ?  ? ?Mobility Bed Mobility ?Overal bed mobility: Needs Assistance ?Bed Mobility: Supine to Sit, Sit to Supine ?  ?  ?Supine to sit: Min assist, HOB elevated ?Sit to supine: Min assist, Mod assist ?  ?General bed mobility comments: Slow labored movement; single hand held assit to pull to sit with HOB elevated. Pt required max A for cooting to head of bed when seated at EOB. ?  ? ?Transfers ?  ?  ?  ?  ?  ?  ?  ?  ?  ?General transfer comment: Pt anxious about standing wanting anther person present. Max A needed to squat and scoot to head of bed. ?  ? ?  ?Balance Overall balance assessment: Needs assistance ?Sitting-balance support: No upper extremity supported, Feet supported ?Sitting balance-Leahy Scale: Fair ?Sitting balance - Comments: seated EOB ?Postural control: Posterior lean ?  ?  ?  ?  ?  ?  ?  ?  ?  ?  ?  ?  ?  ?  ?   ? ?ADL either performed or assessed  with clinical judgement  ? ?ADL Overall ADL's : Needs assistance/impaired ?  ?  ?Grooming: Sitting;Minimal assistance ?  ?  ?  ?  ?  ?Upper Body Dressing : Minimal assistance;Sitting ?  ?Lower Body Dressing: Maximal assistance;Sitting/lateral leans ?Lower Body Dressing Details (indicate cue type and reason): Assited to don socks after pt attempted but could not complete seated at  EOB. ?Toilet Transfer: Maximal assistance;Total assistance;Stand-pivot ?  ?Toileting- Clothing Manipulation and Hygiene: Maximal assistance;Bed level ?  ?  ?  ?  ?General ADL Comments: Pt weak and anxious about trying to stand. R UE limited in A/ROM.  ? ? ? ?Vision Baseline Vision/History: 1 Wears glasses ?Ability to See in Adequate Light: 1 Impaired ?Patient Visual Report: No change from baseline ?Vision Assessment?: No apparent visual deficits  ?   ?   ?  ?   ?  ? ?Pertinent Vitals/Pain Pain Assessment ?Pain Assessment: Faces ?Faces Pain Scale: Hurts little more (Pt also reported sore throat.) ?Pain Location: B thighs ?Pain Descriptors / Indicators: Discomfort, Grimacing ?Pain Intervention(s): Limited activity within patient's tolerance, Monitored during session, Repositioned  ? ? ? ?Hand Dominance Right ?  ?Extremity/Trunk Assessment Upper Extremity Assessment ?Upper Extremity Assessment: RUE deficits/detail;LUE deficits/detail ?RUE Deficits / Details: 3-/5 shoulder flexion; ~75% avialalbe range for P/ROM of flexion and abduction. Tremors throughout session with decreased fine and gross motor coordination. ?RUE Coordination: decreased fine motor;decreased gross motor ?LUE Deficits / Details: Generally weak. ?LUE Coordination: decreased fine motor;decreased gross motor ?  ?Lower Extremity Assessment ?Lower Extremity Assessment: Defer to PT evaluation ?  ?  ?  ?Communication Communication ?Communication: No difficulties ?  ?Cognition Arousal/Alertness: Awake/alert ?Behavior During Therapy: Anxious ?Overall Cognitive Status: No family/caregiver present to determine baseline cognitive functioning ?  ?  ?  ?  ?  ?  ?  ?  ?  ?  ?  ?  ?  ?  ?  ?  ?General Comments: Pt orentied too year, month, and president but not location. Pt [place] at the airport. Pt appeared to get anxious about try to stand by attempting to get back in bed. ?  ?  ?   ? ?  ?   ?  ?    ? ? ?Home Living Family/patient expects to be discharged  to:: Private residence ?Living Arrangements: Spouse/significant other ?Available Help at Discharge: Family;Available PRN/intermittently ?Type of Home: House ?Home Access: Stairs to enter ?Entrance Stairs-Number of Steps: 6 ?Entrance Stairs-Rails: Right ?Home Layout: One level ?  ?  ?Bathroom Shower/Tub: Gaffer;Tub/shower unit ?  ?Bathroom Toilet: Standard ?Bathroom Accessibility: Yes ?How Accessible: Accessible via wheelchair;Accessible via walker ?Home Equipment: Conservation officer, nature (2 wheels);Cane - single point;Shower seat;BSC/3in1;Hospital bed;Wheelchair - manual ?  ?Additional Comments: Pt reports wife works and is not present 24/7. ?  ? ?  ?Prior Functioning/Environment Prior Level of Function : Needs assist ?  ?  ?  ?Physical Assist : ADLs (physical);Mobility (physical) ?Mobility (physical): Gait ?ADLs (physical): IADLs ?Mobility Comments: Household ambulator with RW ?ADLs Comments: Indpednent for ADL's with wfie assisting for IADL's. ?  ? ?  ?  ?OT Problem List: Decreased strength;Decreased range of motion;Decreased activity tolerance;Impaired balance (sitting and/or standing);Decreased coordination;Decreased cognition;Obesity ?  ?   ?OT Treatment/Interventions: Self-care/ADL training;Therapeutic exercise;Therapeutic activities;Cognitive remediation/compensation;Patient/family education;Balance training  ?  ?OT Goals(Current goals can be found in the care plan section) Acute Rehab OT Goals ?Patient Stated Goal: Pt stated to nrusing that he wanted to stand. ?OT Goal Formulation: With patient ?Time For  Goal Achievement: 07/13/21 ?Potential to Achieve Goals: Fair  ?OT Frequency: Min 2X/week ?  ? ?   ?  ?  ?  ?  ? ?  ?   ?  ?  ?  ?  ?  ?  ?  ?End of Session Equipment Utilized During Treatment: Rolling walker (2 wheels);Gait belt ?Nurse Communication: Mobility status ? ?Activity Tolerance: Patient tolerated treatment well ?Patient left: in bed;with call bell/phone within reach;with bed alarm set ? ?OT Visit  Diagnosis: Unsteadiness on feet (R26.81);Other abnormalities of gait and mobility (R26.89);Muscle weakness (generalized) (M62.81);History of falling (Z91.81);Other symptoms and signs involving cognitive function  ?

## 2021-06-29 NOTE — Progress Notes (Addendum)
Patient disoriented x4,  combative and unable to take PO meds at this time after multiple attempts. Patient has scheduled Coreg and Doxycycline that the patient is refusing to take. Patient attempting to swing at nursing staff when repositioned in bed. Dr. Clearence Ped made aware. Patients blood culture came back, 1 of 4 aerobic bottle positive for staph, gram stain with positive cocci in clusters. MD made aware. Repeat blood cultures and MRSA swab ordered.  ?

## 2021-06-29 NOTE — Progress Notes (Signed)
Home medication sent to pharmacy. Secure bag number [id number]; see paper chart for form. Pt's wife [name] made aware of this and encouraged to pick them up when visiting to take it home.  ?

## 2021-06-29 NOTE — Assessment & Plan Note (Signed)
stable °

## 2021-06-29 NOTE — Plan of Care (Signed)
?  Problem: Acute Rehab OT Goals (only OT should resolve) ?Goal: Pt. Will Perform Grooming ?Flowsheets (Taken 06/29/2021 [phone]) ?Pt Will Perform Grooming: ? sitting ? with modified independence ?Goal: Pt. Will Perform Upper Body Dressing ?Flowsheets (Taken 06/29/2021 [phone]) ?Pt Will Perform Upper Body Dressing: ? with modified independence ? sitting ?Goal: Pt. Will Perform Lower Body Dressing ?Flowsheets (Taken 06/29/2021 [phone]) ?Pt Will Perform Lower Body Dressing: ? with min assist ? sitting/lateral leans ? with adaptive equipment ?Goal: Pt. Will Transfer To Toilet ?Flowsheets (Taken 06/29/2021 [phone]) ?Pt Will Transfer to Toilet: ? with min assist ? with mod assist ? stand pivot transfer ?Goal: Pt. Will Perform Toileting-Clothing Manipulation ?Flowsheets (Taken 06/29/2021 [phone]) ?Pt Will Perform Toileting - Clothing Manipulation and hygiene: ? with min guard assist ? sitting/lateral leans ?Goal: Pt/Caregiver Will Perform Home Exercise Program ?Flowsheets (Taken 06/29/2021 [phone]) ?Pt/caregiver will Perform Home Exercise Program: ? Increased ROM ? Increased strength ? Right Upper extremity ? Left upper extremity ? With Supervision ? Allexis Bordenave OT, MOT ? ?

## 2021-06-29 NOTE — Assessment & Plan Note (Addendum)
--  continue IV antibiotics as ordered.  ?--follow culture and sensitivity results  ?--continue supportive measures  ?--appears from culture to be a gram neg rod infection ?--Pt now transitioning to full comfort measures and transitioning to residential hospice when bed available.  ?

## 2021-06-29 NOTE — Assessment & Plan Note (Addendum)
Full comfort care ?

## 2021-06-29 NOTE — Assessment & Plan Note (Signed)
--  likely has underlying SIADH from known malignancy ?

## 2021-06-29 NOTE — H&P (Signed)
?History and Physical  ? ? ?Patient: [name] HOZ:[id number] DOB: 08-15-1945 ?DOA: 06/28/2021 ?DOS: the patient was seen and examined on 06/29/2021 ?PCP: [name], [name], MD  ?Patient coming from: Residential hospice ? ?Chief Complaint:  ?Chief Complaint  ?Patient presents with  ? Altered Mental Status  ? ? ?HPI: [name] is a 76 y.o. male with medical history significant for   Hypertension, depression, GERD, BPH, history of frequent falls,  prostate cancer on hospice and chronic kidney disease stage IV who presents to the emergency department via EMS due to several day onset of altered mental status.  Patient was unable to provide history possibly due to altered mental status, history was obtained from ED physician and ED medical record.  Per report, patient was reported to have fallen out of wheelchair today and was suspected to have UTI due to stronger odor and color of urine.  At baseline, patient was usually alert and oriented x4.   ? ?ED Course:  ?In the emergency department, he was hemodynamically stable, BP was 143/97 on arrival and other vital signs are within normal range.  Work-up in the ED showed normocytic anemia, BUN/creatinine 46/3.99 (baseline creatinine 2.1-2.6), hyponatremia lactic acid was normal, urinalysis was suspected to be indicative of UTI due to large leukocytes and many bacteria and WBC > 50 in the setting of altered mental status. ?CT head without contrast showed chronic atrophic and ischemic changes without acute abnormality. ?Chest x-ray showed no active disease ?Patient was started on IV ceftriaxone, IV hydration (1 L LR) was given.  Hospitalist was asked to admit patient for further evaluation and management. ?  ? ?Review of Systems: ?Unable to review all systems due to the inability of the patient to answer questions. ? ?Past Medical History:  ?Diagnosis Date  ? Aortic atherosclerosis ([place])   ? 2004  s/p  closure Penetrating atherosclerotic ulcer of infarenal aorta  w/ endovascular stent graft  ? Arthritis   ? CKD (chronic kidney disease), stage III ([place])   ? Closed fracture of head of humerus 07/2017  ? right shoulder from fall; 09-25-2017 per pt no surgical intervention, only wore sling, intermittant pain  ? Coronary atherosclerosis of native coronary artery   ? positive myoview for ischemia 09-27-2009;  10-01-2009 per cardiac cath-- minimal nonobstructive CAD w/ 30% LAD   ? ED (erectile dysfunction)   ? Emphysema/COPD [facility])   ? followed by pcp--- last exacerbation 09-12-2017;  09-25-2017 per pt no cough, sob or congestion  ? Essential hypertension   ? Family history of kidney cancer   ? First degree heart block   ? Frequency of urination   ? GERD (gastroesophageal reflux disease)   ? Gout   ? 09-25-2017  per pt stable,  last episode 2015  ? Heart murmur   ? History of adenomatous polyp of colon   ? History of closed head injury 2005  ? per pt residual resolved  ? History of external beam radiation therapy   ? completed 10/ 2015 for prostate cancer  ? History of gastric ulcer 2014  ? History of kidney stones   ? History of pneumothorax   ? spontaneous pneumo treated w/ chest tube  ? History of rib fracture 07/2017  ? from fall,  right side 6th,7th,8th  ? Neuropathy   ? OAB (overactive bladder)   ? OSA (obstructive sleep apnea)   ? Intolerant of CPAP  ? Prostate cancer ([place])   ? dx 09-26-2011 via bx-- Stage T3b,N0,  Gleason 4+4, PSA 11.2 with METs to external iliac lymph node(resolved with ADT)-- treated w/ ADT ;   05/2013 staging work-up for rising PSA , started casodex and completed radiation therapy 10/ 2015;   rising PSA post treatment  ? RLS (restless legs syndrome)   ? Ureteral neocystostomy bleed   ? left side  ? Urge urinary incontinence   ? ?Past Surgical History:  ?Procedure Laterality Date  ? ABDOMINAL AORTIC ENDOVASCULAR STENT GRAFT  09-13-2007    dr [name]  [facility]  ? closure penetrating atherosclerotic ulcer of infarenal aorta with stent graft  ? BIOPSY  03/06/2019  ?  Procedure: BIOPSY;  Surgeon: [name], MD;  Location[facility];  Service: Endoscopy;;  gastric  ? CARDIAC CATHETERIZATION  2005  ? per pt normal (done in [place])  ? CARDIAC CATHETERIZATION  10/06/2009    dr cooper  ? minimal nonobstructive CAD w/30% LAD otherwise normal coronaries, LVEDP 12mHg  ? CARDIOVASCULAR STRESS TEST  09/27/2009    dr mAundra Dubin ? lexiscan nuclear study w/ moderate reversible inferior perfusion defect ischemia,  ef 60% (cardiac cath scheduled)  ? CATARACT EXTRACTION W/PHACO Right 07/12/2020  ? Procedure: CATARACT EXTRACTION PHACO AND INTRAOCULAR LENS PLACEMENT RIGHT EYE;  Surgeon: WBaruch Goldmann MD;  Location: [facility] ORS;  Service: Ophthalmology;  Laterality: Right;  CDE=15.48  ? CHEST TUBE INSERTION  1989  ? "collapsed lung"due to injury  ? CIRCUMCISION  09/26/2011  ? Procedure: CIRCUMCISION ADULT;  Surgeon: JMalka So MD;  Location: [facility] ORS;  Service: Urology;  Laterality: N/A;  ? COLONOSCOPY W/ POLYPECTOMY    ? COLONOSCOPY WITH PROPOFOL N/A 04/27/2016  ?  eight 4-8 mm polyps in descending colon, at splenic flexure, in ascending colon and cecum. Tubular adenomas. Surveillance in 3 years.  ? CYSTOSCOPY  09/26/2011  ? Procedure: CYSTOSCOPY;  Surgeon: JMalka So MD;  Location: [facility] OR[facility];  Service: Urology;  Laterality: N/A;  ? CYSTOSCOPY/RETROGRADE/URETEROSCOPY Left 09/27/2017  ? Procedure: CYSTOSCOPYLEFT /RETROGRADE/URETEROSCOPY AND RENAL WASHINGS;  Surgeon: WIrine Seal MD;  [facility]  Service: Urology;  Laterality: Left;  ? ESOPHAGOGASTRODUODENOSCOPY (EGD) WITH PROPOFOL N/A 04/27/2016  ? normal esophagus s/p dilation, small hiatal hernia, normal duodenum  ? ESOPHAGOGASTRODUODENOSCOPY (EGD) WITH PROPOFOL N/A 03/06/2019  ? erosive reflux esophagitis, s/p dilation, normal duodenum, abnormal gastric mucosa s/p biopsy. Hyperplastic gastric polyp. No H.pylori.   ? EXTRACORPOREAL SHOCK WAVE LITHOTRIPSY  yrs ago  ? FRACTURE SURGERY  child  ? Bilateral lower arms   ?  KNEE ARTHROSCOPY Right 2013  ? MALONEY DILATION N/A 04/27/2016  ? Procedure: MALONEY DILATION;  Surgeon: RDaneil Dolin MD;  [facility];  Service: Endoscopy;  Laterality: N/A;  ? MALONEY DILATION N/A 03/06/2019  ? Procedure: MALONEY DILATION;  Surgeon: RDaneil Dolin MD;  [facility];  Service: Endoscopy;  Laterality: N/A;  ? PARTIAL KNEE ARTHROPLASTY Right 04/26/2015  ? Procedure: RIGHT KNEE MEDIAL UNICOMPARTMENTAL ARTHROPLASTY;  Surgeon: FGaynelle Arabian MD;  [facility];  Service: Orthopedics;  Laterality: Right;  ? POLYPECTOMY  04/27/2016  ? Procedure: POLYPECTOMY;  Surgeon: RDaneil Dolin MD;  [facility];  Service: Endoscopy;;  cecal , ascending, descending, and sigmoid polypectomies  ? PROSTATE BIOPSY  09/26/2011  ? Procedure: BIOPSY TRANSRECTAL ULTRASONIC PROSTATE (TUBP);  Surgeon: JMalka So MD;  [facility];  Service: Urology;  Laterality: N/A;   ?  ? TRANSTHORACIC ECHOCARDIOGRAM  01-07-2013   dr mDomenic Polite ? ef 60-65%,  grade 1 diastolic dysfunction/  mild AR without stenosis/  mild LAE/  mild to moderate calcificed MV annulus without regurg. or stenosis/  ? UMBILICAL HERNIA REPAIR  yrs ago  ? ? ?Social History:  reports that he has been smoking cigarettes. He has a 21.00 pack-year smoking history. He has never used smokeless tobacco. He reports that he does not drink alcohol and does not use drugs. ? ? ?Allergies  ?Allergen Reactions  ? Carbidopa-Levodopa Other (See Comments)  ?  hallunications  ? Morphine Sulfate Nausea And Vomiting  ? Sulfa Antibiotics Nausea And Vomiting  ? Sulfacetamide Sodium Nausea And Vomiting  ? ? ?Family History  ?Problem Relation Age of Onset  ? Stroke Mother   ? Alzheimer's disease Mother 25  ?     probably due to head injury  ? Mesothelioma Father 24  ? Kidney cancer Father   ? Hyperlipidemia Sister   ? Heart attack Sister   ? Kidney cancer Sister   ?     dx in her 11s  ? Heart disease Brother   ? Kidney cancer Brother 47  ?      dx in his 64s  ? Hyperlipidemia Brother   ? Kidney cancer Brother   ?     dx in his 51s  ? Healthy Daughter   ? Healthy Son   ? Healthy Son   ? Colon cancer Neg Hx   ?  ? ?Prior to Admission medicat

## 2021-06-29 NOTE — Assessment & Plan Note (Addendum)
--   He is not full comfort care and awaiting residential hospice placement  ?

## 2021-06-29 NOTE — Progress Notes (Signed)
[facility] [address] [facility]) Hospitalized Hospice Patient ?  ?[name] is a current [facility] hospice patient with a terminal diagnosis of traumatic subarachnoid hemmorhage. [name] lives at home with his wife and for the last couple of days has been experiencing worsening AMS. Patient subsequently fell out of his wheelchair evening of 3.28, EMS was called and he was transported to the hospital. Patient is admitted as of 3.29.23 with a diagnosis of UTI. Per Dr. Alferd Hanson with [facility] this is a related admission.  ?  ?Attempted visit with patient in room however he was sleeping off and on and when awake would become agitated and attempt to get out of bed. Exchanged report with bedside nurse and talked with patient's wife by phone.  ? ?Patient requires inpatient status currently for treatment of UTI, AMS and need for increased monitoring.  ?  ?V/S: 98.9/103/18    146/85  spO2 94% room air ?I&O: 1455/0 ?Diagnostics:  ?EXAM: ?PORTABLE CHEST 1 VIEW ? COMPARISON:  02/08/2021 ?  ?FINDINGS: ?Cardiac shadow is within normal limits. Lungs are well aerated ?bilaterally. No focal infiltrate or effusion is seen. Old rib ?fractures are noted on the left. No acute bony abnormality is seen. ?  ?IMPRESSION: ?No active disease. ?  ? Electronically Signed ?  By: [name] M.D. ?  On: 06/29/2021 00:11 ? ?Labs: Na+ 132, Glucose 113, BUN 46, Creatinine 3.99, Ca+ 8.5, Phos 4.9 GFR 15, RBC 3.36, Hgb 10.2, Hct 31.5 Urine with large leukocytes and many bacteria ? ?IV/PRN: NS @ 75cc/h, Rocephin 1 gram every 24 hours, Keppra '500mg'$  q12 h, LR 1031m bolus x2 ?  ?Problem List: ?UTI (urinary tract infection) ?--continue IV antibiotics as ordered.  ?--follow culture and sensitivity results  ?--continue supportive measures  ?  ?Recurrent falls ?--Pt remains on home hospice.   Will ask for PT evaluation.  Fall precautions advised.   ?  ?Acute kidney injury superimposed on chronic kidney disease ([place] ?--check renal function  in AM  ?  ?Dehydration ?--IV fluid ordered for resuscitation ?  ?Hyponatremia ?--likely has underlying SIADH from known malignancy ?  ?Seizure ([place] ?-- resume home meds.  Follow.  No recent seizure activity reported.  ?  ?Prostate cancer ([facility] ?-- He remains on home hospice care with possible DC 3/30 ?  ?GOC: Ongoing, wife would like patient stabilized and UTI treated and for him to return home.  ?D/C planning: ongoing ?Family: talked on phone with his wife GPeter Hanson?IDT: Hospice team updated ?  ?Transfer summary and Med list placed on patient's chart.  ?Please don't hesitate to call for any hospice related questions or concerns.  ?MJhonnie Garner BSN, RTherapist, sports [facility]?Hospice hospital liaison ?[phone]?

## 2021-06-29 NOTE — Evaluation (Addendum)
Physical Therapy Evaluation ?Patient Details ?Name: [name] ?MRN: [id number] ?DOB: 08-26-1945 ?Today's Date: 06/29/2021 ? ?History of Present Illness ? [name] is a 76 y.o. male with medical history significant for   Hypertension, depression, GERD, BPH, history of frequent falls,  prostate cancer on hospice and chronic kidney disease stage IV who presents to the emergency department via EMS due to several day onset of altered mental status.  Patient was unable to provide history possibly due to altered mental status, history was obtained from ED physician and ED medical record.  Per report, patient was reported to have fallen out of wheelchair today and was suspected to have UTI due to stronger odor and color of urine.  At baseline, patient was usually alert and oriented x4. ?  ?Clinical Impression ? Patient demonstrates slow labored movement for sitting up at bedside, very unsteady on feet, at high risk for falls and limited to a few shuffling side steps at bedside before having to sit due to BLE weakness and easily fatigued.  Patient tolerated sitting up in chair after therapy - nursing staff aware.  Patient will benefit from continued skilled physical therapy in hospital and recommended venue below to increase strength, balance, endurance for safe ADLs and gait.  ? ?   ?   ? ?Recommendations for follow up therapy are one component of a multi-disciplinary discharge planning process, led by the attending physician.  Recommendations may be updated based on patient status, additional functional criteria and insurance authorization. ? ?Follow Up Recommendations Skilled nursing - short term Rehab (<3 hours/day) ? ?  ?Assistance Recommended at Discharge Frequent or constant Supervision/Assistance  ?Patient can return home with the following ? Assistance with cooking/housework;Assist for transportation;A lot of help with walking and/or transfers;A lot of help with bathing/dressing/bathroom;Help with  stairs or ramp for entrance ? ?  ?Equipment Recommendations None recommended by PT  ?Recommendations for Other Services ?    ?  ?Functional Status Assessment Patient has had a recent decline in their functional status and demonstrates the ability to make significant improvements in function in a reasonable and predictable amount of time.  ? ?  ?Precautions / Restrictions Precautions ?Precautions: Fall ?Restrictions ?Weight Bearing Restrictions: No  ? ?  ? ?Mobility ? Bed Mobility ?Overal bed mobility: Needs Assistance ?Bed Mobility: Supine to Sit, Sit to Supine ?  ?  ?Supine to sit: Mod assist ?  ?  ?General bed mobility comments: very unsteady shaky labored movement, increased time ?  ? ?Transfers ?Overall transfer level: Needs assistance ?Equipment used: Rolling walker (2 wheels) ?Transfers: Sit to/from Stand, Bed to chair/wheelchair/BSC ?Sit to Stand: Mod assist ?  ?Step pivot transfers: Mod assist, Max assist ?  ?  ?  ?General transfer comment: very unsteady labored movement with difficulty taking steps due to BLE weakness ?  ? ?Ambulation/Gait ?Ambulation/Gait assistance: Max assist ?Gait Distance (Feet): 3 Feet ?Assistive device: Rolling walker (2 wheels) ?Gait Pattern/deviations: Decreased step length - right, Decreased step length - left, Decreased stride length, Knees buckling, Shuffle ?Gait velocity: slow ?  ?  ?General Gait Details: limited to a few slow labored shuffling steps requiring Max tactile assistance to move LLE ? ?Stairs ?  ?  ?  ?  ?  ? ?Wheelchair Mobility ?  ? ?Modified Rankin (Stroke Patients Only) ?  ? ?  ? ?Balance Overall balance assessment: Needs assistance ?Sitting-balance support: Feet supported, No upper extremity supported ?Sitting balance-Leahy Scale: Fair ?Sitting balance - Comments: seated EOB ?  ?  Standing balance support: During functional activity, Reliant on assistive device for balance, Bilateral upper extremity supported ?Standing balance-Leahy Scale: Poor ?Standing balance  comment: using RW ?  ?  ?  ?  ?  ?  ?  ?  ?  ?  ?  ?   ? ? ? ?Pertinent Vitals/Pain Pain Assessment ?Pain Assessment: No/denies pain  ? ? ?Home Living Family/patient expects to be discharged to:: Private residence ?Living Arrangements: Spouse/significant other ?Available Help at Discharge: Family;Available PRN/intermittently ?Type of Home: House ?Home Access: Stairs to enter ?Entrance Stairs-Rails: Right ?Entrance Stairs-Number of Steps: 6 ?  ?Home Layout: One level ?Home Equipment: Conservation officer, nature (2 wheels);Cane - single point;Shower seat;BSC/3in1;Hospital bed;Wheelchair - manual ?   ?  ?Prior Function Prior Level of Function : Needs assist ?  ?  ?  ?Physical Assist : ADLs (physical);Mobility (physical) ?Mobility (physical): Gait;Bed mobility;Transfers;Stairs ?  ?Mobility Comments: Household ambulator with RW ?ADLs Comments: Indpednent for ADL's with wfie assisting for IADL's. ?  ? ? ?Hand Dominance  ? Dominant Hand: Right ? ?  ?Extremity/Trunk Assessment  ? Upper Extremity Assessment ?Upper Extremity Assessment: Defer to OT evaluation ?  ? ?Lower Extremity Assessment ?Lower Extremity Assessment: Generalized weakness ?  ? ?Cervical / Trunk Assessment ?Cervical / Trunk Assessment: Normal  ?Communication  ? Communication: Expressive difficulties  ?Cognition Arousal/Alertness: Awake/alert ?Behavior During Therapy: Anxious ?Overall Cognitive Status: No family/caregiver present to determine baseline cognitive functioning ?  ?  ?  ?  ?  ?  ?  ?  ?  ?  ?  ?  ?  ?  ?  ?  ?  ?  ?  ? ?  ?General Comments   ? ?  ?Exercises    ? ?Assessment/Plan  ?  ?PT Assessment Patient needs continued PT services  ?PT Problem List Decreased strength;Decreased activity tolerance;Decreased balance;Decreased mobility ? ?   ?  ?PT Treatment Interventions DME instruction;Gait training;Stair training;Functional mobility training;Therapeutic activities;Therapeutic exercise;Balance training;Patient/family education   ? ?PT Goals (Current goals can  be found in the Care Plan section)  ?Acute Rehab PT Goals ?Patient Stated Goal: return home ?PT Goal Formulation: With patient ?Time For Goal Achievement: 07/13/21 ?Potential to Achieve Goals: Good ? ?  ?Frequency Min 3X/week ?  ? ? ?Co-evaluation   ?  ?  ?  ?  ? ? ?  ?AM-PAC PT "6 Clicks" Mobility  ?Outcome Measure Help needed turning from your back to your side while in a flat bed without using bedrails?: A Little ?Help needed moving from lying on your back to sitting on the side of a flat bed without using bedrails?: A Lot ?Help needed moving to and from a bed to a chair (including a wheelchair)?: A Lot ?Help needed standing up from a chair using your arms (e.g., wheelchair or bedside chair)?: A Lot ?Help needed to walk in hospital room?: A Lot ?Help needed climbing 3-5 steps with a railing? : Total ?6 Click Score: 12 ? ?  ?End of Session   ?Activity Tolerance: Patient tolerated treatment well;Patient limited by fatigue ?Patient left: in chair;with call bell/phone within reach;with chair alarm set ?Nurse Communication: Mobility status ?PT Visit Diagnosis: Unsteadiness on feet (R26.81);Other abnormalities of gait and mobility (R26.89);Muscle weakness (generalized) (M62.81) ?  ? ?Time: 6433-2951 ?PT Time Calculation (min) (ACUTE ONLY): 26 min ? ? ?Charges:   PT Evaluation ?$PT Eval Moderate Complexity: 1 Mod ?PT Treatments ?$Therapeutic Activity: 23-37 mins ?  ?   ? ? ?3:43 PM, 06/29/21 ?  Lonell Grandchild, MPT ?Physical Therapist with [place] ?[facility] ?[phone] office ?[phone] mobile phone ? ? ?

## 2021-06-29 NOTE — Evaluation (Signed)
Clinical/Bedside Swallow Evaluation ?Patient Details  ?Name: [name] ?MRN: [id number] ?Date of Birth: 09-19-1945 ? ?Today's Date: 06/29/2021 ?Time: SLP Start Time (ACUTE ONLY): 0915 SLP Stop Time (ACUTE ONLY): 0936 ?SLP Time Calculation (min) (ACUTE ONLY): 21 min ? ?Past Medical History:  ?Past Medical History:  ?Diagnosis Date  ? Aortic atherosclerosis ([place])   ? 2004  s/p  closure Penetrating atherosclerotic ulcer of infarenal aorta w/ endovascular stent graft  ? Arthritis   ? CKD (chronic kidney disease), stage III ([place])   ? Closed fracture of head of humerus 07/2017  ? right shoulder from fall; 09-25-2017 per pt no surgical intervention, only wore sling, intermittant pain  ? Coronary atherosclerosis of native coronary artery   ? positive myoview for ischemia 09-27-2009;  10-01-2009 per cardiac cath-- minimal nonobstructive CAD w/ 30% LAD   ? ED (erectile dysfunction)   ? Emphysema/COPD [facility])   ? followed by pcp--- last exacerbation 09-12-2017;  09-25-2017 per pt no cough, sob or congestion  ? Essential hypertension   ? Family history of kidney cancer   ? First degree heart block   ? Frequency of urination   ? GERD (gastroesophageal reflux disease)   ? Gout   ? 09-25-2017  per pt stable,  last episode 2015  ? Heart murmur   ? History of adenomatous polyp of colon   ? History of closed head injury 2005  ? per pt residual resolved  ? History of external beam radiation therapy   ? completed 10/ 2015 for prostate cancer  ? History of gastric ulcer 2014  ? History of kidney stones   ? History of pneumothorax   ? spontaneous pneumo treated w/ chest tube  ? History of rib fracture 07/2017  ? from fall,  right side 6th,7th,8th  ? Neuropathy   ? OAB (overactive bladder)   ? OSA (obstructive sleep apnea)   ? Intolerant of CPAP  ? Prostate cancer ([place])   ? dx 09-26-2011 via bx-- Stage T3b,N0,  Gleason 4+4, PSA 11.2 with METs to external iliac lymph node(resolved with ADT)-- treated w/ ADT ;   05/2013 staging work-up  for rising PSA , started casodex and completed radiation therapy 10/ 2015;   rising PSA post treatment  ? RLS (restless legs syndrome)   ? Ureteral neocystostomy bleed   ? left side  ? Urge urinary incontinence   ? ?Past Surgical History:  ?Past Surgical History:  ?Procedure Laterality Date  ? ABDOMINAL AORTIC ENDOVASCULAR STENT GRAFT  09-13-2007    dr [name]  [facility]  ? closure penetrating atherosclerotic ulcer of infarenal aorta with stent graft  ? BIOPSY  03/06/2019  ? Procedure: BIOPSY;  Surgeon: [name], MD;  [facility];  Service: Endoscopy;;  gastric  ? CARDIAC CATHETERIZATION  2005  ? per pt normal (done in [place])  ? CARDIAC CATHETERIZATION  10/06/2009    dr cooper  ? minimal nonobstructive CAD w/30% LAD otherwise normal coronaries, LVEDP 17mHg  ? CARDIOVASCULAR STRESS TEST  09/27/2009    dr mAundra Dubin ? lexiscan nuclear study w/ moderate reversible inferior perfusion defect ischemia,  ef 60% (cardiac cath scheduled)  ? CATARACT EXTRACTION W/PHACO Right 07/12/2020  ? Procedure: CATARACT EXTRACTION PHACO AND INTRAOCULAR LENS PLACEMENT RIGHT EYE;  Surgeon: WBaruch Goldmann MD;  Location: [facility] OR[facility];  Service: Ophthalmology;  Laterality: Right;  CDE=15.48  ? CHEST TUBE INSERTION  1989  ? "collapsed lung"due to injury  ? CIRCUMCISION  09/26/2011  ? Procedure: CIRCUMCISION ADULT;  Surgeon: [name], MD;  Location: [facility];  Service: Urology;  Laterality: N/A;  ? COLONOSCOPY W/ POLYPECTOMY    ? COLONOSCOPY WITH PROPOFOL N/A 04/27/2016  ?  eight 4-8 mm polyps in descending colon, at splenic flexure, in ascending colon and cecum. Tubular adenomas. Surveillance in 3 years.  ? CYSTOSCOPY  09/26/2011  ? Procedure: CYSTOSCOPY;  Surgeon: [name], MD;  Location: [facility];  Service: Urology;  Laterality: N/A;  ? CYSTOSCOPY/RETROGRADE/URETEROSCOPY Left 09/27/2017  ? Procedure: CYSTOSCOPYLEFT /RETROGRADE/URETEROSCOPY AND RENAL WASHINGS;  Surgeon: [name], MD;  Location[facility];  Service:  Urology;  Laterality: Left;  ? ESOPHAGOGASTRODUODENOSCOPY (EGD) WITH PROPOFOL N/A 04/27/2016  ? normal esophagus s/p dilation, small hiatal hernia, normal duodenum  ? ESOPHAGOGASTRODUODENOSCOPY (EGD) WITH PROPOFOL N/A 03/06/2019  ? erosive reflux esophagitis, s/p dilation, normal duodenum, abnormal gastric mucosa s/p biopsy. Hyperplastic gastric polyp. No H.pylori.   ? EXTRACORPOREAL SHOCK WAVE LITHOTRIPSY  yrs ago  ? FRACTURE SURGERY  child  ? Bilateral lower arms   ? KNEE ARTHROSCOPY Right 2013  ? MALONEY DILATION N/A 04/27/2016  ? Procedure: MALONEY DILATION;  Surgeon: [name], MD;  [facility];  Service: Endoscopy;  Laterality: N/A;  ? MALONEY DILATION N/A 03/06/2019  ? Procedure: MALONEY DILATION;  Surgeon: [name], MD;  [facility];  Service: Endoscopy;  Laterality: N/A;  ? PARTIAL KNEE ARTHROPLASTY Right 04/26/2015  ? Procedure: RIGHT KNEE MEDIAL UNICOMPARTMENTAL ARTHROPLASTY;  Surgeon: Gaynelle Arabian, MD;  [facility];  Service: Orthopedics;  Laterality: Right;  ? POLYPECTOMY  04/27/2016  ? Procedure: POLYPECTOMY;  Surgeon: [name], MD;  [facility];  Service: Endoscopy;;  cecal , ascending, descending, and sigmoid polypectomies  ? PROSTATE BIOPSY  09/26/2011  ? Procedure: BIOPSY TRANSRECTAL ULTRASONIC PROSTATE (TUBP);  Surgeon: [name], MD;  [facility];  Service: Urology;  Laterality: N/A;   ?  ? TRANSTHORACIC ECHOCARDIOGRAM  01-07-2013   dr [name]  ? ef 60-65%,  grade 1 diastolic dysfunction/  mild AR without stenosis/  mild LAE/  mild to moderate calcificed MV annulus without regurg. or stenosis/  ? UMBILICAL HERNIA REPAIR  yrs ago  ? ?HPI:  ?[name] is a 76 y.o. male with medical history significant for   Hypertension, depression, GERD, BPH, history of frequent falls,  prostate cancer on hospice and chronic kidney disease stage IV who presents to the emergency department via EMS due to several day onset of altered mental  status.  Patient was unable to provide history possibly due to altered mental status, history was obtained from ED physician and ED medical record.  Per report, patient was reported to have fallen out of wheelchair today and was suspected to have UTI due to stronger odor and color of urine.  At baseline, patient was usually alert and oriented x4. Work-up in the ED showed normocytic anemia, BUN/creatinine 46/3.99 (baseline creatinine 2.1-2.6), hyponatremia lactic acid was normal, urinalysis was suspected to be indicative of UTI due to large leukocytes and many bacteria and WBC > 50 in the setting of altered mental status.  CT head without contrast showed chronic atrophic and ischemic changes without acute abnormality.  Chest x-ray showed no active disease  Patient was started on IV ceftriaxone, IV hydration (1 L LR) was given. BSE requested.  ?  ?Assessment / Plan / Recommendation  ?Clinical Impression ? Pt presents with oral phase dysphagia in setting of acute pain with swallow.  Oral motor examination reveals erythema along posterior left soft palate and Pt sensitive to touch and reports pain to his left neck and ear. He also grimaces with each swallow. Pt states he has had this pain for 1-2 weeks, however he appears intermittently confused. Pt assessed with ice chips, water via cup/straw, puree, and regular textures. Pt without overt signs/symptoms of aspiration, but does report pain with swallow and mastications of solids resulting in prolonged oral transit and need for liquid wash. Recommend D3/mech soft and thin liquids and po medications whole with water and consider further imaging of neck (to MD). SLP will continue to follow in acute setting.  ? ?SLP Visit Diagnosis: Dysphagia, oral phase (R13.11) ?   ?Aspiration Risk ? Risk for inadequate nutrition/hydration  ?  ?Diet Recommendation Dysphagia 3 (Mech soft);Thin liquid  ? ?Liquid Administration via: Cup;Straw ?Medication Administration: Whole meds with  liquid ?Supervision: Patient able to self feed;Intermittent supervision to cue for compensatory strategies ?Compensations: Slow rate;Small sips/bites ?Postural Changes: Seated upright at 90 degrees;Remain upright

## 2021-06-29 NOTE — Assessment & Plan Note (Addendum)
--  He was treated with IV fluid and now transitioned to full comfort care ?

## 2021-06-29 NOTE — Assessment & Plan Note (Signed)
--  resume home rosuvastatin  ?

## 2021-06-29 NOTE — Plan of Care (Signed)
?  Problem: Acute Rehab PT Goals(only PT should resolve) ?Goal: Pt Will Go Supine/Side To Sit ?Outcome: Progressing ?Flowsheets (Taken 06/29/2021 1544) ?Pt will go Supine/Side to Sit: with minimal assist ?Goal: Patient Will Transfer Sit To/From Stand ?Outcome: Progressing ?Flowsheets (Taken 06/29/2021 1544) ?Patient will transfer sit to/from stand: with minimal assist ?Goal: Pt Will Transfer Bed To Chair/Chair To Bed ?Outcome: Progressing ?Flowsheets (Taken 06/29/2021 1544) ?Pt will Transfer Bed to Chair/Chair to Bed: with min assist ?Goal: Pt Will Ambulate ?Outcome: Progressing ?Flowsheets (Taken 06/29/2021 1544) ?Pt will Ambulate: ? 25 feet ? with moderate assist ? with minimal assist ? with rolling walker ?  ?3:45 PM, 06/29/21 ?Lonell Grandchild, MPT ?Physical Therapist with [place] ?[facility] ?[phone] office ?[phone] mobile phone ? ?

## 2021-06-29 NOTE — Progress Notes (Signed)
Patient became agitated laboratory when the tried to obtain blood cultures.  Because of patient combativeness, only one set of blood cultures was done.  ?

## 2021-06-29 NOTE — Progress Notes (Signed)
?PROGRESS NOTE ? ? ?[name]  AVW:[id number] DOB: 1945/11/20 DOA: 06/28/2021 ?PCP: [name], [name], MD  ? ?Chief Complaint  ?Patient presents with  ? Altered Mental Status  ? ?Level of care: Med-Surg ? ?Brief Admission History:  ?76 y.o. male with medical history significant for   Hypertension, depression, GERD, BPH, history of frequent falls,  prostate cancer on home hospice and chronic kidney disease stage IV who presents to the emergency department via EMS due to several day onset of altered mental status.  Patient was unable to provide history possibly due to altered mental status, history was obtained from ED physician and ED medical record.  Per report, patient was reported to have fallen out of wheelchair today and was suspected to have UTI due to stronger odor and color of urine.  At baseline, patient was usually alert and oriented x4.   ?  ?ED Course:  ?In the emergency department, he was hemodynamically stable, BP was 143/97 on arrival and other vital signs are within normal range.  Work-up in the ED showed normocytic anemia, BUN/creatinine 46/3.99 (baseline creatinine 2.1-2.6), hyponatremia lactic acid was normal, urinalysis was suspected to be indicative of UTI due to large leukocytes and many bacteria and WBC > 50 in the setting of altered mental status.  CT head without contrast showed chronic atrophic and ischemic changes without acute abnormality.  Chest x-ray showed no active disease.  Patient was started on IV ceftriaxone, IV hydration (1 L LR) was given.  Hospitalist was asked to admit patient for further evaluation and management. ?  ?06/29/2021:  pt remains very confused (likely his baseline).  He is being treated with IV fluid and with antibiotics for UTI.  ?  ?Assessment and Plan: ?* UTI (urinary tract infection) ?--continue IV antibiotics as ordered.  ?--follow culture and sensitivity results  ?--continue supportive measures  ? ?Recurrent falls ?--Pt remains on home hospice.   Will  ask for PT evaluation.  Fall precautions advised.   ? ?Acute kidney injury superimposed on chronic kidney disease ([place]) ?--check renal function in AM  ? ?Dehydration ?--IV fluid ordered for resuscitation ? ?Hyponatremia ?--likely has underlying SIADH from known malignancy ? ?Seizure ([place]) ?-- resume home meds.  Follow.  No recent seizure activity reported.  ? ?Prostate cancer [facility] ?-- He remains on home hospice care with possible DC 3/30 ? ?COPD (chronic obstructive pulmonary disease) ([place]) ?--stable  ? ?GERD ?--protonix for GI protection ordered  ? ?OBESITY NOS ?--stable  ? ?Hyperlipidemia ?--resume home rosuvastatin  ? ?DVT prophylaxis: enoxaparin  ?Code Status: DNR  ?Family Communication: t/c 3/29 ?Disposition: Status is: Inpatient ?Remains inpatient appropriate because: IV fluid and IV antibiotics required  ?  ?Consultants:  ?PT  ?Procedures:  ?N/a ?Antimicrobials:  ?Ceftriaxone 3/29>>  ?Subjective: ?PT mostly nonverbal with severe dementia  ?Objective: ?Vitals:  ? 06/28/21 2335 06/28/21 2336 06/29/21 0030 06/29/21 0234  ?BP:  (!) 143/97 125/77 (!) 159/80  ?Pulse: 67  70 69  ?Resp: (!) '24 16 18 18  '$ ?Temp:    98.2 ?F (36.8 ?C)  ?TempSrc:    Oral  ?SpO2: 100%  96% 95%  ?Weight:      ?Height:      ? ? ?Intake/Output Summary (Last 24 hours) at 06/29/2021 1234 ?Last data filed at 06/29/2021 5374 ?Gross per 24 hour  ?Intake 1455 ml  ?Output --  ?Net 1455 ml  ? ?Filed Weights  ? 06/28/21 2329  ?Weight: 104 kg  ? ?Examination: ? ?General exam: confused with  dementia, mostly nonverbal. Appears calm and comfortable  ?Respiratory system: Clear to auscultation. Respiratory effort normal. ?Cardiovascular system: normal S1 & S2 heard. No JVD, murmurs, rubs, gallops or clicks. No pedal edema. ?Gastrointestinal system: Abdomen is nondistended, soft and nontender. No organomegaly or masses felt. Normal bowel sounds heard. ?Central nervous system: Alert and oriented. No focal neurological deficits. ?Extremities: Symmetric 5 x 5  power. ?Skin: No rashes, lesions or ulcers. ?Psychiatry: Judgement and insight appear very poor.  Mood & affect appropriate.  ? ?Data Reviewed: I have personally reviewed following labs and imaging studies ? ?CBC: ?Recent Labs  ?Lab 06/28/21 ?2349  ?WBC 8.3  ?NEUTROABS 6.8  ?HGB 10.2*  ?HCT 31.5*  ?MCV 93.8  ?PLT 212  ? ? ?Basic Metabolic Panel: ?Recent Labs  ?Lab 06/28/21 ?2349 06/29/21 ?0456  ?NA 132*  --   ?K 5.1  --   ?CL 100  --   ?CO2 23  --   ?GLUCOSE 113*  --   ?BUN 46*  --   ?CREATININE 3.99*  --   ?CALCIUM 8.5*  --   ?MG  --  2.4  ?PHOS  --  4.9*  ? ? ?CBG: ?No results for input(s): GLUCAP in the last 168 hours. ? ?Recent Results (from the past 240 hour(s))  ?Blood Culture (routine x 2)     Status: None (Preliminary result)  ? Collection Time: 06/28/21 11:49 PM  ? Specimen: BLOOD  ?Result Value Ref Range Status  ? Specimen Description BLOOD RIGHT ANTECUBITAL  Final  ? Special Requests   Final  ?  BOTTLES DRAWN AEROBIC AND ANAEROBIC Blood Culture adequate volume  ? Culture   Final  ?  NO GROWTH < 12 HOURS ?Performed at [facility], [address] [place], [place] [address] ?  ? Report Status PENDING  Incomplete  ?Blood Culture (routine x 2)     Status: None (Preliminary result)  ? Collection Time: 06/28/21 11:49 PM  ? Specimen: BLOOD  ?Result Value Ref Range Status  ? Specimen Description BLOOD LEFT ANTECUBITAL  Final  ? Special Requests   Final  ?  BOTTLES DRAWN AEROBIC AND ANAEROBIC Blood Culture adequate volume  ? Culture   Final  ?  NO GROWTH < 12 HOURS ?Performed at [facility], [address] [place], [place] [address] ?  ? Report Status PENDING  Incomplete  ?  ? ?Radiology Studies: ?CT Head Wo Contrast ? ?Result Date: 06/29/2021 ?CLINICAL DATA:  Altered mental status for few days, initial encounter EXAM: CT HEAD WITHOUT CONTRAST TECHNIQUE: Contiguous axial images were obtained from the base of the skull through the vertex without intravenous contrast. RADIATION DOSE REDUCTION: This exam was  performed according to the departmental dose-optimization program which includes automated exposure control, adjustment of the mA and/or kV according to patient size and/or use of iterative reconstruction technique. COMPARISON:  03/03/2021 FINDINGS: Brain: No evidence of acute infarction, hemorrhage, hydrocephalus, extra-axial collection or mass lesion/mass effect. Atrophic and ischemic changes are noted. Vascular: No hyperdense vessel or unexpected calcification. Skull: Normal. Negative for fracture or focal lesion. Sinuses/Orbits: No acute finding. Other: None. IMPRESSION: Chronic atrophic and ischemic changes without acute abnormality. Electronically Signed   By: Inez Catalina M.D.   On: 06/29/2021 00:12  ? ?DG Chest Port 1 View ? ?Result Date: 06/29/2021 ?CLINICAL DATA:  Possible sepsis EXAM: PORTABLE CHEST 1 VIEW COMPARISON:  02/08/2021 FINDINGS: Cardiac shadow is within normal limits. Lungs are well aerated bilaterally. No focal infiltrate or effusion is seen. Old rib  fractures are noted on the left. No acute bony abnormality is seen. IMPRESSION: No active disease. Electronically Signed   By: Inez Catalina M.D.   On: 06/29/2021 00:11   ? ?Scheduled Meds: ? carvedilol  12.5 mg Oral BID  ? enoxaparin (LOVENOX) injection  30 mg Subcutaneous Q24H  ? mouth rinse  15 mL Mouth Rinse BID  ? pantoprazole (PROTONIX) IV  40 mg Intravenous Q24H  ? rosuvastatin  20 mg Oral Daily  ? ?Continuous Infusions: ? sodium chloride    ? [START ON 06/30/2021] cefTRIAXone (ROCEPHIN)  IV    ? levETIRAcetam 500 mg (06/29/21 6294)  ? ? LOS: 0 days  ? ?Time spent: 36 mins ? ?Irwin Brakeman, MD ?How to contact the [facility] Attending or Consulting provider [place] or covering provider during after hours [place], for this patient?  ?Check the care team in [facility] and look for a) attending/consulting TRH provider listed and b) the [facility] team listed ?Log into [email or web address] and use [place]'s universal password to access. If you do not have the password,  please contact the hospital operator. ?Locate the [facility] provider you are looking for under [facility] and page to a number that you can be directly reached. ?If you still have difficulty reaching the prov

## 2021-06-29 NOTE — Assessment & Plan Note (Addendum)
--  Pt remains on home hospice but has become more residential hospice appropriate.  I have requested [facility] to evaluate patient for residential hospice and asked for discussion with family.  ?--Pt transitioning to full comfort care 07/01/21     ?

## 2021-06-29 NOTE — Hospital Course (Addendum)
76 y.o. male with medical history significant for   Hypertension, depression, GERD, BPH, history of frequent falls,  prostate cancer on home hospice and chronic kidney disease stage IV who presents to the emergency department via EMS due to several day onset of altered mental status.  Patient was unable to provide history possibly due to altered mental status, history was obtained from ED physician and ED medical record.  Per report, patient was reported to have fallen out of wheelchair today and was suspected to have UTI due to stronger odor and color of urine.  At baseline, patient was usually alert and oriented x4.   ?  ?ED Course:  ?In the emergency department, he was hemodynamically stable, BP was 143/97 on arrival and other vital signs are within normal range.  Work-up in the ED showed normocytic anemia, BUN/creatinine 46/3.99 (baseline creatinine 2.1-2.6), hyponatremia lactic acid was normal, urinalysis was suspected to be indicative of UTI due to large leukocytes and many bacteria and WBC > 50 in the setting of altered mental status.  CT head without contrast showed chronic atrophic and ischemic changes without acute abnormality.  Chest x-ray showed no active disease.  Patient was started on IV ceftriaxone, IV hydration (1 L LR) was given.  Hospitalist was asked to admit patient for further evaluation and management. ?  ?06/29/2021:  pt remains very confused (likely his baseline).  He is being treated with IV fluid and with antibiotics for UTI.  ? ?06/30/2021: pt clinically worse more encephalopathic, appears uncomfortable.  Asked family to consider residential hospice as he is appropriate.   ? ?07/01/2021:  spoke with [facility] rep Jhonnie Garner informing me that family decided to transition to full comfort care and they may have an inpatient hospice bed available in next 1-2 days.  Full comfort care orders initiated.  Pt a little less agitated today.  ? ?07/02/2021: Pt accepted for inpatient bed today.  DC to  residential hospice.  SYMPTOM MANAGEMENT PER HOSPICE PROTOCOL  ?

## 2021-06-29 NOTE — TOC Initial Note (Signed)
Transition of Care (TOC) - Initial/Assessment Note  ? ? ?Patient Details  ?Name: [name] ?MRN: [id number] ?Date of Birth: 1945-05-10 ? ?Transition of Care (TOC) CM/SW Contact:    ?Elda Dunkerson D, LCSW ?Phone Number: ?06/29/2021, 4:01 PM ? ?Clinical Narrative:                 ?Patient is active with [facility] in hospice. Misty with [facility] will reach out to office and see if they can eval patient for PT.  ? ?Expected Discharge Plan: [place] ?Barriers to Discharge: Continued Medical Work up ? ? ?Patient Goals and CMS Choice ?  ?  ?  ? ?Expected Discharge Plan and Services ?Expected Discharge Plan: [place] ?  ?  ?  ?Living arrangements for the past 2 months: [place] ?                ?  ?  ?  ?  ?  ?  ?  ?  ?  ?  ? ?Prior Living Arrangements/Services ?Living arrangements for the past 2 months: [place] ?Lives with:: Spouse ?  ?       ?  ?  ?  ?  ? ?Activities of Daily Living ?Home Assistive Devices/Equipment: Eyeglasses, Civil engineer, contracting with back, Environmental consultant (specify type), Wheelchair, Bedside commode/3-in-1 ?ADL Screening (condition at time of admission) ?Patient's cognitive ability adequate to safely complete daily activities?: Yes ?Is the patient deaf or have difficulty hearing?: No ?Does the patient have difficulty seeing, even when wearing glasses/contacts?: No ?Does the patient have difficulty concentrating, remembering, or making decisions?: Yes ?Patient able to express need for assistance with ADLs?: Yes ?Does the patient have difficulty dressing or bathing?: Yes ?Independently performs ADLs?: No ?Communication: Independent ?Dressing (OT): Needs assistance ?Is this a change from baseline?: Change from baseline, expected to last >3 days ?Grooming: Needs assistance ?Is this a change from baseline?: Pre-admission baseline ?Feeding: Needs assistance ?Is this a change from baseline?: Pre-admission baseline ?Bathing: Needs assistance ?Is this a change from  baseline?: Pre-admission baseline ?Toileting: Needs assistance ?Is this a change from baseline?: Pre-admission baseline ?In/Out Bed: Needs assistance ?Is this a change from baseline?: Pre-admission baseline ?Walks in Home: Needs assistance ?Is this a change from baseline?: Pre-admission baseline ?Does the patient have difficulty walking or climbing stairs?: Yes ?Weakness of Legs: Both ?Weakness of Arms/Hands: Both ? ?Permission Sought/Granted ?  ?  ?   ?   ?   ?   ? ?Emotional Assessment ?  ?  ?  ?  ?  ?  ? ?Admission diagnosis:  Delirium [R41.0] ?UTI (urinary tract infection) [N39.0] ?Acute cystitis with hematuria [N30.01] ?AKI (acute kidney injury) ([place]) [N17.9] ?Patient Active Problem List  ? Diagnosis Date Noted  ? UTI (urinary tract infection) 06/29/2021  ? Seizure ([place]) 06/29/2021  ? Hyponatremia 06/29/2021  ? Dehydration 06/29/2021  ? Acute kidney injury superimposed on chronic kidney disease ([place]) 06/29/2021  ? Recurrent falls 06/29/2021  ? Delirium   ? Gastroenteritis due to COVID-19 virus 03/03/2021  ? Hypokalemia due to excessive gastrointestinal loss of potassium 03/02/2021  ? Essential hypertension 03/02/2021  ? Thrush 03/02/2021  ? SAH (subarachnoid hemorrhage) ([place]) 02/24/2021  ? Subarachnoid bleed ([place]) 02/23/2021  ? Acute metabolic encephalopathy 46/27/0350  ? Incisional hernia, without obstruction or gangrene 11/23/2020  ? 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 Family history of kidney cancer   ? Chronic kidney disease (CKD), stage IV (severe) ([place]) 07/10/2018  ?  Depression, recurrent ([place]) 04/08/2018  ? Neuropathy 03/17/2015  ? Prostate cancer ([place]) 06/30/2013  ? Abdominal aneurysm without mention of rupture 02/19/2013  ? Morbid obesity ([place]) 01/14/2013  ? VITAMIN B12 DEFICIENCY 01/14/2013  ? TOBACCO ABUSE 01/14/2013  ? PTSD 01/14/2013  ? COPD (chronic obstructive pulmonary disease) ([place])   ? OSA (obstructive sleep apnea) 08/14/2012  ? RLS (restless legs syndrome) 08/14/2012  ?  Prediabetes 08/14/2012  ? Aortic atherosclerosis ([place]) 02/19/2012  ? Coronary atherosclerosis of native coronary artery 03/23/2010  ? Hypokalemia 11/25/2009  ? GERD 07/30/2008  ? History of colonic polyps 07/30/2008  ? Benign prostatic hyperplasia with lower urinary tract symptoms 02/20/2008  ? Hyperlipidemia 01/31/2007  ? GOUT 01/31/2007  ? OBESITY NOS 01/31/2007  ? Essential hypertension, benign 01/31/2007  ? ?PCP:  [name], [name], MD ?Pharmacy:   ?[facility], [place] [address] ?[address] ?[place] [address] ?Phone: [phone] Fax: [phone] ? ?[facility], [address] [place] ?[address] ?[place] [address] ?Phone: [phone] Fax: [phone] ? ?[facility] [place] ?[address] ([place]) [place] [address] ?Phone: [phone] Fax: [phone] ? ? ? ? ?Social Determinants of Health (SDOH) Interventions ?  ? ?Readmission Risk Interventions ? ?  02/25/2021  ?  1:22 PM  ?Readmission Risk Prevention Plan  ?Transportation Screening Complete  ?Medication Review Press photographer) Complete  ?PCP or Specialist appointment within 3-5 days of discharge Complete  ?[place] or Home Care Consult Complete  ?SW Recovery Care/Counseling Consult Complete  ?Palliative Care Screening Not Applicable  ?[place] Not Applicable  ? ? ? ?

## 2021-06-29 NOTE — Assessment & Plan Note (Signed)
--   resume home meds.  Follow.  No recent seizure activity reported.  ?

## 2021-06-29 NOTE — Assessment & Plan Note (Signed)
--  protonix for GI protection ordered  ?

## 2021-06-30 ENCOUNTER — Encounter (HOSPITAL_COMMUNITY): Payer: Self-pay | Admitting: Internal Medicine

## 2021-06-30 DIAGNOSIS — Z7189 Other specified counseling: Secondary | ICD-10-CM

## 2021-06-30 DIAGNOSIS — C61 Malignant neoplasm of prostate: Secondary | ICD-10-CM | POA: Diagnosis not present

## 2021-06-30 DIAGNOSIS — N390 Urinary tract infection, site not specified: Secondary | ICD-10-CM | POA: Diagnosis not present

## 2021-06-30 DIAGNOSIS — Z515 Encounter for palliative care: Secondary | ICD-10-CM | POA: Diagnosis not present

## 2021-06-30 DIAGNOSIS — N3001 Acute cystitis with hematuria: Principal | ICD-10-CM

## 2021-06-30 LAB — CBC
HCT: 30.8 % — ABNORMAL LOW (ref 39.0–52.0)
Hemoglobin: 9.7 g/dL — ABNORMAL LOW (ref 13.0–17.0)
MCH: 30.4 pg (ref 26.0–34.0)
MCHC: 31.5 g/dL (ref 30.0–36.0)
MCV: 96.6 fL (ref 80.0–100.0)
Platelets: 187 10*3/uL (ref 150–400)
RBC: 3.19 MIL/uL — ABNORMAL LOW (ref 4.22–5.81)
RDW: 13.6 % (ref 11.5–15.5)
WBC: 8.4 10*3/uL (ref 4.0–10.5)
nRBC: 0 % (ref 0.0–0.2)

## 2021-06-30 LAB — COMPREHENSIVE METABOLIC PANEL
ALT: 11 U/L (ref 0–44)
AST: 16 U/L (ref 15–41)
Albumin: 3.2 g/dL — ABNORMAL LOW (ref 3.5–5.0)
Alkaline Phosphatase: 50 U/L (ref 38–126)
Anion gap: 8 (ref 5–15)
BUN: 40 mg/dL — ABNORMAL HIGH (ref 8–23)
CO2: 19 mmol/L — ABNORMAL LOW (ref 22–32)
Calcium: 7.9 mg/dL — ABNORMAL LOW (ref 8.9–10.3)
Chloride: 110 mmol/L (ref 98–111)
Creatinine, Ser: 3.23 mg/dL — ABNORMAL HIGH (ref 0.61–1.24)
GFR, Estimated: 19 mL/min — ABNORMAL LOW (ref >60)
Glucose, Bld: 96 mg/dL (ref 70–99)
Potassium: 4.4 mmol/L (ref 3.5–5.1)
Sodium: 137 mmol/L (ref 135–145)
Total Bilirubin: 0.7 mg/dL (ref 0.3–1.2)
Total Protein: 6.5 g/dL (ref 6.5–8.1)

## 2021-06-30 LAB — MRSA NEXT GEN BY PCR, NASAL: MRSA by PCR Next Gen: NOT DETECTED

## 2021-06-30 MED ORDER — LEVETIRACETAM 500 MG PO TABS: 500.0000 mg | ORAL_TABLET | Freq: Two times a day (BID) | ORAL | Status: DC

## 2021-06-30 MED ORDER — HYDRALAZINE HCL 20 MG/ML IJ SOLN: 10.0000 mg | Freq: Four times a day (QID) | INTRAMUSCULAR | Status: DC | PRN

## 2021-06-30 NOTE — Progress Notes (Signed)
?PROGRESS NOTE ? ? ?[name]  FWY:[id number] DOB: [birth year] DOA: 06/28/2021 ?PCP: [name], [name], MD  ? ?Chief Complaint  ?Patient presents with  ? Altered Mental Status  ? ?Level of care: Med-Surg ? ?Brief Admission History:  ?76 y.o. male with medical history significant for   Hypertension, depression, GERD, BPH, history of frequent falls,  prostate cancer on home hospice and chronic kidney disease stage IV who presents to the emergency department via EMS due to several day onset of altered mental status.  Patient was unable to provide history possibly due to altered mental status, history was obtained from ED physician and ED medical record.  Per report, patient was reported to have fallen out of wheelchair today and was suspected to have UTI due to stronger odor and color of urine.  At baseline, patient was usually alert and oriented x4.   ?  ?ED Course:  ?In the emergency department, he was hemodynamically stable, BP was 143/97 on arrival and other vital signs are within normal range.  Work-up in the ED showed normocytic anemia, BUN/creatinine 46/3.99 (baseline creatinine 2.1-2.6), hyponatremia lactic acid was normal, urinalysis was suspected to be indicative of UTI due to large leukocytes and many bacteria and WBC > 50 in the setting of altered mental status.  CT head without contrast showed chronic atrophic and ischemic changes without acute abnormality.  Chest x-ray showed no active disease.  Patient was started on IV ceftriaxone, IV hydration (1 L LR) was given.  Hospitalist was asked to admit patient for further evaluation and management. ?  ?06/29/2021:  pt remains very confused (likely his baseline).  He is being treated with IV fluid and with antibiotics for UTI.  ? ?06/30/2021: pt clinically worse more encephalopathic, appears uncomfortable.  Asked family to consider residential hospice as he is appropriate.   ?  ?Assessment and Plan: ?* UTI (urinary tract infection) ?--continue IV  antibiotics as ordered.  ?--follow culture and sensitivity results  ?--continue supportive measures  ?--appears from culture to be a gram neg rod infection, follow for ID and sensitivities ? ?Delirium ?--this has become severe and he has been medicated for this.  Family in discussion about transition to full comfort measures which would be entirely appropriate with life expectancy <2 weeks.   ? ?Recurrent falls ?--Pt remains on home hospice but has become more residential hospice appropriate.  I have requested [facility] to evaluate patient for residential hospice and asked for discussion with family.  They are in discussion and will get back to [place].     ? ?Acute kidney injury superimposed on stage IV chronic kidney disease  ?--check renal function in AM  ? ?Dehydration ?--IV fluid ordered for resuscitation ? ?Hyponatremia ?--likely has underlying SIADH from known malignancy ? ?Seizure ([place]) ?-- resume home meds.  Follow.  No recent seizure activity reported.  ? ?Prostate cancer [facility]) ?-- He remains on home hospice care with possible DC 3/30 ? ?COPD (chronic obstructive pulmonary disease) ([place]) ?--stable  ? ?GERD ?--protonix for GI protection ordered  ? ?OBESITY NOS ?--stable  ? ?Hyperlipidemia ?--resume home rosuvastatin  ? ?DVT prophylaxis: enoxaparin  ?Code Status: DNR  ?Family Communication: t/c 3/29 ?Disposition: Status is: recommending residential hospice awaiting family decision  ?Remains inpatient appropriate because: IV fluid and IV antibiotics required  ?  ?Consultants:  ?PT  ?Procedures:  ?N/a ?Antimicrobials:  ?Ceftriaxone 3/29>>  ?Subjective: ?PT mostly nonverbal with severe dementia  ?Objective: ?Vitals:  ? 06/29/21 2000 06/30/21 0127 06/30/21 2774 06/30/21 1301  ?  BP: (!) 145/83  (!) 192/70 (!) 164/110  ?Pulse: 79  63 70  ?Resp: '20  20 20  '$ ?Temp: 100.3 ?F (37.9 ?C) 98.2 ?F (36.8 ?C) (!) 97.2 ?F (36.2 ?C) 97.9 ?F (36.6 ?C)  ?TempSrc:  Axillary  Oral  ?SpO2: 90%  96% 91%  ?Weight:      ?Height:       ? ? ?Intake/Output Summary (Last 24 hours) at 06/30/2021 1522 ?Last data filed at 06/30/2021 1300 ?Gross per 24 hour  ?Intake 1452.5 ml  ?Output 1050 ml  ?Net 402.5 ml  ? ?Filed Weights  ? 06/28/21 2329  ?Weight: 104 kg  ? ?Examination: ? ?General exam: severely confused with dementia and acute delirium, he remains mostly nonverbal. Appears uncomfortable and mentally distressed due to unfamiliar surroundings.   ?Respiratory system: Clear to auscultation. Respiratory effort normal. ?Cardiovascular system: normal S1 & S2 heard. No JVD, murmurs, rubs, gallops or clicks. No pedal edema. ?Gastrointestinal system: Abdomen is nondistended, soft and nontender. No organomegaly or masses felt. Normal bowel sounds heard. ?Central nervous system: Alert and oriented. No focal neurological deficits. ?Extremities: Symmetric 5 x 5 power. ?Skin: No rashes, lesions or ulcers. ?Psychiatry: Judgement and insight appear very poor.  Mood & affect appropriate.  ? ?Data Reviewed: I have personally reviewed following labs and imaging studies ? ?CBC: ?Recent Labs  ?Lab 06/28/21 ?2349 06/30/21 ?0503  ?WBC 8.3 8.4  ?NEUTROABS 6.8  --   ?HGB 10.2* 9.7*  ?HCT 31.5* 30.8*  ?MCV 93.8 96.6  ?PLT 212 187  ? ? ?Basic Metabolic Panel: ?Recent Labs  ?Lab 06/28/21 ?2349 06/29/21 ?0456 06/30/21 ?0503  ?NA 132*  --  137  ?K 5.1  --  4.4  ?CL 100  --  110  ?CO2 23  --  19*  ?GLUCOSE 113*  --  96  ?BUN 46*  --  40*  ?CREATININE 3.99*  --  3.23*  ?CALCIUM 8.5*  --  7.9*  ?MG  --  2.4  --   ?PHOS  --  4.9*  --   ? ? ?CBG: ?No results for input(s): GLUCAP in the last 168 hours. ? ?Recent Results (from the past 240 hour(s))  ?Blood Culture (routine x 2)     Status: None (Preliminary result)  ? Collection Time: 06/28/21 11:49 PM  ? Specimen: BLOOD  ?Result Value Ref Range Status  ? Specimen Description BLOOD RIGHT ANTECUBITAL  Final  ? Special Requests   Final  ?  BOTTLES DRAWN AEROBIC AND ANAEROBIC Blood Culture adequate volume  ? Culture   Final  ?  NO GROWTH 2  DAYS ?Performed at [facility], [address], [place], [place] [address] ?  ? Report Status PENDING  Incomplete  ?Blood Culture (routine x 2)     Status: Abnormal (Preliminary result)  ? Collection Time: 06/28/21 11:49 PM  ? Specimen: BLOOD  ?Result Value Ref Range Status  ? Specimen Description   Final  ?  BLOOD LEFT ANTECUBITAL ?Performed at [facility], [address], [place], [place] [address] ?  ? Special Requests   Final  ?  BOTTLES DRAWN AEROBIC AND ANAEROBIC Blood Culture adequate volume ?Performed at [facility], [address], [place], [place] [address] ?  ? Culture  Setup Time   Final  ?  GRAM POSITIVE COCCI IN CLUSTERS BOTTLES DRAWN AEROBIC ONLY ?Gram Stain Report Called to,Read Back By and Verified With: Charlynn Court RN [phone] K FORSYTH ?CRITICAL RESULT CALLED TO, READ BACK BY AND VERIFIED  WITH: RN Ebony Hail [phone] '@2045'$  FH ?  ? Culture (A)  Final  ?  STAPHYLOCOCCUS SIMULANS ?THE SIGNIFICANCE OF ISOLATING THIS ORGANISM FROM A SINGLE SET OF BLOOD CULTURES WHEN MULTIPLE SETS ARE DRAWN IS UNCERTAIN. PLEASE NOTIFY THE MICROBIOLOGY DEPARTMENT WITHIN ONE WEEK IF SPECIATION AND SENSITIVITIES ARE REQUIRED. ?Performed at [facility], [place] [address], [place],  [address] ?  ? Report Status PENDING  Incomplete  ?Blood Culture ID Panel (Reflexed)     Status: Abnormal  ? Collection Time: 06/28/21 11:49 PM  ?Result Value Ref Range Status  ? Enterococcus faecalis NOT DETECTED NOT DETECTED Final  ? Enterococcus Faecium NOT DETECTED NOT DETECTED Final  ? Listeria monocytogenes NOT DETECTED NOT DETECTED Final  ? Staphylococcus species DETECTED (A) NOT DETECTED Final  ?  Comment: CRITICAL RESULT CALLED TO, READ BACK BY AND VERIFIED WITH: ?RN J. RHEW [id number] '@2045'$  FH ?  ? Staphylococcus aureus (BCID) NOT DETECTED NOT DETECTED Final  ? Staphylococcus epidermidis NOT DETECTED NOT DETECTED Final  ? Staphylococcus lugdunensis NOT DETECTED NOT DETECTED Final  ? Streptococcus species NOT DETECTED NOT DETECTED Final   ? Streptococcus agalactiae NOT DETECTED NOT DETECTED Final  ? Streptococcus pneumoniae NOT DETECTED NOT DETECTED Final  ? Streptococcus pyogenes NOT DETECTED NOT DETECTED Final  ? A.calcoaceticus-baumannii NOT DE

## 2021-06-30 NOTE — Progress Notes (Signed)
SLP Cancellation Note ? ?Patient Details ?Name: [name] ?MRN: [id number] ?DOB: May 23, 1945 ? ? ?Cancelled treatment:       Reason Eval/Treat Not Completed: Other (comment);Fatigue/lethargy limiting ability to participate; Pt has not been alert today per report. SLP tried to awaken and engage Pt, however unable despite max cues. ? ?Thank you, ? ?Genene Churn, [place] ?[phone] ? ? ? ?[name] ?06/30/2021, 2:33 PM ?

## 2021-06-30 NOTE — Consult Note (Signed)
? ?                                                                                ?Consultation Note ?Date: 06/30/2021  ? ?Patient Name: [name]  ?DOB: 1945-10-13  MRN: [id number]  Age / Sex: 76 y.o., male  ?PCP: [name], [name], MD ?Referring Physician: Murlean Iba, MD ? ?Reason for Consultation: Establishing goals of care and Inpatient hospice referral ? ?HPI/Patient Profile: 76 y.o. male  with past medical history of prostate cancer with hospice care through [facility], emphysema/COPD BPH, GERD, frequent falls, HTN, depression, aortic atherosclerosis, CKD 3, right shoulder fracture from fall 2019, admitted on 06/28/2021 with UTI present on admission..  ? ?Clinical Assessment and Goals of Care: ?I have reviewed medical records including EPIC notes, labs and imaging, received report from RN, assessed the patient.  Mr. Pry is lying quietly in bed.  He is unable to make eye contact, he mumbles when asked orientation questions.  He is clearly unable to make his basic needs known.  There is no family at bedside at this time.   ? ?Conference with attending, bedside nursing staff and transition of care team who shares that Mr. Cavallaro is being evaluated for residential hospice today. ? ?Call to [facility] rep, Roselee Nova to discuss case and disposition.  At this point [name]'s family is considering whether to return home with hospice care or residential hospice.  [facility] Representative [employer], is in close contact with family.  ? ?Conference with attending, bedside nursing staff, transition of care team related to patient condition, needs, goals of care, disposition. ? ? ?HCPOA  ?NEXT OF KIN - spouse [name]   ?  ? ?SUMMARY OF RECOMMENDATIONS   ?Family is considering full comfort care, residential hospice in [place] ?[facility] representative is working closely with family ? ? ?Code Status/Advance Care Planning: ?DNR ? ?Symptom Management:  ?Per hospitalist and hospice,  no additional needs at this time. ? ?Palliative Prophylaxis:  ?Frequent Pain Assessment, Oral Care, and Turn Reposition ? ?Additional Recommendations (Limitations, Scope, Preferences): ?Considering comfort care ? ?Psycho-social/Spiritual:  ?Desire for further Chaplaincy support:no ?Additional Recommendations: Caregiving  Support/Resources and Education on Hospice ? ?Prognosis:  ?< 2 weeks, would not be surprising if family elects comfort and dignity, let nature take its course at residential hospice. ? ?Discharge Planning:  To be determined, considering residential hospice for comfort and dignity at end-of-life versus treat the treatable and returning home with hospice care.   ? ?  ? ?Primary Diagnoses: ?Present on Admission: ? UTI (urinary tract infection) ? Prostate cancer ([place]) ? Hyperlipidemia ? Essential hypertension, benign ? GERD ? COPD (chronic obstructive pulmonary disease) ([place]) ? OBESITY NOS ? Hyponatremia ? Dehydration ? Acute kidney injury superimposed on chronic kidney disease ([place]) ? ? ?I have reviewed the medical record, interviewed the patient and family, and examined the patient. The following aspects are pertinent. ? ?Past Medical History:  ?Diagnosis Date  ? Aortic atherosclerosis ([place])   ? 2004  s/p  closure Penetrating atherosclerotic ulcer of infarenal aorta w/ endovascular stent graft  ? Arthritis   ? CKD (chronic kidney disease), stage III ([place])   ? Closed fracture  of head of humerus 07/2017  ? right shoulder from fall; 09-25-2017 per pt no surgical intervention, only wore sling, intermittant pain  ? Coronary atherosclerosis of native coronary artery   ? positive myoview for ischemia 09-27-2009;  10-01-2009 per cardiac cath-- minimal nonobstructive CAD w/ 30% LAD   ? ED (erectile dysfunction)   ? Emphysema/COPD [facility])   ? followed by pcp--- last exacerbation 09-12-2017;  09-25-2017 per pt no cough, sob or congestion  ? Essential hypertension   ? Family history of kidney cancer   ? First  degree heart block   ? Frequency of urination   ? GERD (gastroesophageal reflux disease)   ? Gout   ? 09-25-2017  per pt stable,  last episode 2015  ? Heart murmur   ? History of adenomatous polyp of colon   ? History of closed head injury 2005  ? per pt residual resolved  ? History of external beam radiation therapy   ? completed 10/ 2015 for prostate cancer  ? History of gastric ulcer 2014  ? History of kidney stones   ? History of pneumothorax   ? spontaneous pneumo treated w/ chest tube  ? History of rib fracture 07/2017  ? from fall,  right side 6th,7th,8th  ? Neuropathy   ? OAB (overactive bladder)   ? OSA (obstructive sleep apnea)   ? Intolerant of CPAP  ? Prostate cancer ([place])   ? dx 09-26-2011 via bx-- Stage T3b,N0,  Gleason 4+4, PSA 11.2 with METs to external iliac lymph node(resolved with ADT)-- treated w/ ADT ;   05/2013 staging work-up for rising PSA , started casodex and completed radiation therapy 10/ 2015;   rising PSA post treatment  ? RLS (restless legs syndrome)   ? Ureteral neocystostomy bleed   ? left side  ? Urge urinary incontinence   ? ?Social History  ? ?Socioeconomic History  ? Marital status: Married  ?  Spouse name: Not on file  ? Number of children: 3  ? Years of education: 37  ? Highest education level: Some college, no degree  ?Occupational History  ? Occupation: Retired  ?  Comment: [employer]  ?  Comment: Part Time at [employer]  ?Tobacco Use  ? Smoking status: Every Day  ?  Packs/day: 0.50  ?  Years: 42.00  ?  Pack years: 21.00  ?  Types: Cigarettes  ? Smokeless tobacco: Never  ?Vaping Use  ? Vaping Use: Never used  ?Substance and Sexual Activity  ? Alcohol use: No  ?  Alcohol/week: 0.0 standard drinks  ? Drug use: No  ? Sexual activity: Yes  ?  Birth control/protection: None  ?Other Topics Concern  ? Not on file  ?Social History Narrative  ? Patient is retired from the [employer]. He lives in a one story home with his wife. He moved from [place] [facility] about 9 years  ago. They do not have family on the [place] and although he is very outgoing he feels socially isolated. He isn't a member of any groups or churches.   ? ?Social Determinants of Health  ? ?Financial Resource Strain: Medium Risk  ? Difficulty of Paying Living Expenses: Somewhat hard  ?Food Insecurity: No Food Insecurity  ? Worried About Charity fundraiser in the Last Year: Never true  ? Ran Out of Food in the Last Year: Never true  ?Transportation Needs: No Transportation Needs  ? Lack of Transportation (Medical): No  ? Lack of Transportation (  Non-Medical): No  ?Physical Activity: Inactive  ? Days of Exercise per Week: 0 days  ? Minutes of Exercise per Session: 0 min  ?Stress: Stress Concern Present  ? Feeling of Stress : To some extent  ?Social Connections: Moderately Isolated  ? Frequency of Communication with Friends and Family: More than three times a week  ? Frequency of Social Gatherings with Friends and Family: More than three times a week  ? Attends Religious Services: Never  ? Active Member of Clubs or Organizations: No  ? Attends Archivist Meetings: Never  ? Marital Status: Married  ? ?Family History  ?Problem Relation Age of Onset  ? Stroke Mother   ? Alzheimer's disease Mother 71  ?     probably due to head injury  ? Mesothelioma Father 55  ? Kidney cancer Father   ? Hyperlipidemia Sister   ? Heart attack Sister   ? Kidney cancer Sister   ?     dx in her 47s  ? Heart disease Brother   ? Kidney cancer Brother 24  ?     dx in his 69s  ? Hyperlipidemia Brother   ? Kidney cancer Brother   ?     dx in his 54s  ? Healthy Daughter   ? Healthy Son   ? Healthy Son   ? Colon cancer Neg Hx   ? ?Scheduled Meds: ? carvedilol  12.5 mg Oral BID  ? doxycycline  100 mg Oral Q12H  ? enoxaparin (LOVENOX) injection  30 mg Subcutaneous Q24H  ? mouth rinse  15 mL Mouth Rinse BID  ? pantoprazole (PROTONIX) IV  40 mg Intravenous Q24H  ? rosuvastatin  20 mg Oral Daily  ? ?Continuous Infusions: ? sodium chloride 75  mL/hr at 06/30/21 0125  ? cefTRIAXone (ROCEPHIN)  IV 1 g (06/30/21 0127)  ? levETIRAcetam 500 mg (06/30/21 0902)  ? ?PRN Meds:.acetaminophen, haloperidol lactate, hydrALAZINE, LORazepam, phenol ?Medications

## 2021-06-30 NOTE — Assessment & Plan Note (Addendum)
--  this has become severe and he has been medicated for this.   ?--Pt now transitioned to full comfort measures which is entirely appropriate given life expectancy <2 weeks.  See comfort care orders.  ?

## 2021-06-30 NOTE — Progress Notes (Signed)
[facility] [address] [facility]) Hospitalized Hospice Patient ?  ?[name] is a current [facility] hospice patient with a terminal diagnosis of traumatic subarachnoid hemmorhage. [name] lives at home with his wife and for the last couple of days has been experiencing worsening AMS. Patient subsequently fell out of his wheelchair evening of 3.28, EMS was called and he was transported to the hospital. Patient is admitted as of 3.29.23 with a diagnosis of UTI. Per Dr. Alferd Patee with [facility] this is a related admission.  ?  ?Visited at bedside, no family present. Patient lying flat in bed, awake but does not try to communicate, picking at sheets and moving around in bed. Bedside nurse reports she was able to get him to take oral meds crushed but he did not wake enough to eat. Per [facility] Heather attending is inquiring about shift towards comfort care and inpatient hospice. Attempted to reach patient's wife without success. Was able to reach grandson [name] who was unaware of the seriousness of patient's current illness. [name] will reach out to other family members and reach back out to this nurse later this evening.  ? ?Patient requires inpatient status currently for treatment of UTI, AMS and need for increased monitoring.  ?  ?V/S: 97.9/70/20  164/110            spO2 91% room air ?I&O: 1452/1050 ?Diagnostics:  ?EXAM: ?CT MAXILLOFACIAL WITHOUT CONTRAST  ?TECHNIQUE: ?Multidetector CT imaging of the maxillofacial structures was ?performed. Multiplanar CT image reconstructions were also generated.  ?RADIATION DOSE REDUCTION: This exam was performed according to the ?departmental dose-optimization program which includes automated ?exposure control, adjustment of the mA and/or kV according to ?patient size and/or use of iterative reconstruction technique.  ?COMPARISON:  02/28/2021  ?FINDINGS: ?Osseous: No bone or joint abnormality.  ?Orbits: Globes, optic nerves, orbital fat and extraocular muscles ?appear  normal.  ?Sinuses: Sinuses are clear. No fluid in the middle ears or mastoids.  ?Soft tissues: Enlarged inflamed submandibular gland on the left ?consistent with sialoadenitis. Question small submandibular duct ?stone axial image 81.  ?Limited intracranial: Negative  ?IMPRESSION: ?Enlarged inflamed left submandibular gland consistent with ?sialoadenitis. Question small stone in the submandibular duct axial ?image 81.  ?Electronically Signed ?  By: Nelson Chimes M.D. ?  On: 06/29/2021 12:34 ? ?Labs: BUN 40, Creatinine 3.23, Ca+ 7.9, Phos 4.9, Albumin 3.2, GFR 19, RBC 3.19, HGB 9,7, HCT 30.8 ? ?IV/PRN: NS @ 75cc/h, Rocephin 1 gram every 24 hours ?  ?Problem List: ?UTI (urinary tract infection) ?--continue IV antibiotics as ordered.  ?--follow culture and sensitivity results  ?--continue supportive measures  ?  ?Recurrent falls ?--Pt remains on home hospice.   Will ask for PT evaluation.  Fall precautions advised.   ?  ?Acute kidney injury superimposed on chronic kidney disease ([place]) ?--check renal function in AM  ?  ?Dehydration ?--IV fluid ordered for resuscitation ?  ?  ?GOC: Ongoing, Discussion with wife and family about possible transition to comfort care at [facility].   ?D/C planning: ongoing ?Family: talked on phone with his wife [name] and grandson [name] ?IDT: Hospice team updated ? ?Please don't hesitate to call for any hospice related questions or concerns.  ?Jhonnie Garner, BSN, Therapist, sports, [employer] ?Hospice hospital liaison ?[phone] ?

## 2021-07-01 DIAGNOSIS — R410 Disorientation, unspecified: Secondary | ICD-10-CM | POA: Diagnosis not present

## 2021-07-01 DIAGNOSIS — E860 Dehydration: Secondary | ICD-10-CM | POA: Diagnosis not present

## 2021-07-01 DIAGNOSIS — Z66 Do not resuscitate: Secondary | ICD-10-CM | POA: Diagnosis present

## 2021-07-01 DIAGNOSIS — J449 Chronic obstructive pulmonary disease, unspecified: Secondary | ICD-10-CM | POA: Diagnosis not present

## 2021-07-01 DIAGNOSIS — N179 Acute kidney failure, unspecified: Secondary | ICD-10-CM | POA: Diagnosis not present

## 2021-07-01 LAB — CULTURE, BLOOD (ROUTINE X 2): Special Requests: ADEQUATE

## 2021-07-01 LAB — URINE CULTURE: Culture: >=100000 — AB

## 2021-07-01 MED ORDER — DARIFENACIN HYDROBROMIDE ER 7.5 MG PO TB24
7.5000 mg | ORAL_TABLET | Freq: Every day | ORAL | Status: DC
  Administered 2021-07-01: 7.5 mg via ORAL
  Filled 2021-07-01: qty 1

## 2021-07-01 MED ORDER — POLYVINYL ALCOHOL 1.4 % OP SOLN: 1.0000 [drp] | Freq: Four times a day (QID) | OPHTHALMIC | Status: DC | PRN

## 2021-07-01 MED ORDER — TAMSULOSIN HCL 0.4 MG PO CAPS
0.4000 mg | ORAL_CAPSULE | Freq: Every day | ORAL | Status: DC
  Administered 2021-07-01: 0.4 mg via ORAL
  Filled 2021-07-01: qty 1

## 2021-07-01 MED ORDER — LORAZEPAM 2 MG/ML IJ SOLN
1.0000 mg | Freq: Three times a day (TID) | INTRAMUSCULAR | Status: DC
  Administered 2021-07-01 – 2021-07-02 (×3): 1 mg via INTRAVENOUS
  Filled 2021-07-01 (×3): qty 1

## 2021-07-01 MED ORDER — HALOPERIDOL LACTATE 5 MG/ML IJ SOLN
3.0000 mg | INTRAMUSCULAR | Status: DC | PRN
  Administered 2021-07-01: 3 mg via INTRAVENOUS
  Filled 2021-07-01: qty 1

## 2021-07-01 MED ORDER — ONDANSETRON HCL 4 MG/2ML IJ SOLN: 4.0000 mg | Freq: Four times a day (QID) | INTRAMUSCULAR | Status: DC | PRN

## 2021-07-01 MED ORDER — PANTOPRAZOLE SODIUM 40 MG PO TBEC: 40.0000 mg | DELAYED_RELEASE_TABLET | Freq: Every day | ORAL | Status: DC

## 2021-07-01 MED ORDER — ONDANSETRON 4 MG PO TBDP: 4.0000 mg | ORAL_TABLET | Freq: Four times a day (QID) | ORAL | Status: DC | PRN

## 2021-07-01 MED ORDER — BIOTENE DRY MOUTH MT LIQD: 15.0000 mL | OROMUCOSAL | Status: DC | PRN

## 2021-07-01 MED ORDER — HYDROMORPHONE HCL 1 MG/ML IJ SOLN: 1.0000 mg | INTRAMUSCULAR | Status: DC | PRN

## 2021-07-01 MED ORDER — BISACODYL 10 MG RE SUPP: 10.0000 mg | Freq: Every day | RECTAL | Status: DC | PRN

## 2021-07-01 MED ORDER — DIPHENHYDRAMINE HCL 50 MG/ML IJ SOLN: 12.5000 mg | INTRAMUSCULAR | Status: DC | PRN

## 2021-07-01 MED ORDER — NYSTATIN 100000 UNIT/GM EX POWD: Freq: Three times a day (TID) | CUTANEOUS | Status: DC | PRN

## 2021-07-01 MED ORDER — LORAZEPAM 1 MG PO TABS: 1.0000 mg | ORAL_TABLET | Freq: Three times a day (TID) | ORAL | Status: DC

## 2021-07-01 MED ORDER — DULOXETINE HCL 60 MG PO CPEP
60.0000 mg | ORAL_CAPSULE | Freq: Every day | ORAL | Status: DC
  Administered 2021-07-01: 60 mg via ORAL
  Filled 2021-07-01: qty 1

## 2021-07-01 MED ORDER — ATROPINE SULFATE 1 % OP SOLN: 4.0000 [drp] | OPHTHALMIC | Status: DC | PRN

## 2021-07-01 MED ORDER — OXYCODONE HCL 5 MG PO TABS
5.0000 mg | ORAL_TABLET | ORAL | Status: DC
  Administered 2021-07-01: 5 mg via ORAL
  Filled 2021-07-01: qty 1

## 2021-07-01 MED ORDER — LORAZEPAM 1 MG PO TABS
1.0000 mg | ORAL_TABLET | Freq: Three times a day (TID) | ORAL | Status: DC
  Administered 2021-07-01: 1 mg via ORAL
  Filled 2021-07-01: qty 1

## 2021-07-01 NOTE — Progress Notes (Signed)
PT Cancellation Note ? ?Patient Details ?Name: [name] ?MRN: [id number] ?DOB: [birth year] ? ? ?Cancelled Treatment:    Reason Eval/Treat Not Completed: Other (comment)Patient transitioning to comfort care. PT signing off ? ?1:45 PM, 07/01/21 ?Mearl Latin PT, DPT ?Physical Therapist at [facility] ?[facility] ? ? ?

## 2021-07-01 NOTE — Progress Notes (Signed)
?PROGRESS NOTE ? ? ?[name]  WGN:[id number] DOB: 1946/02/06 DOA: 06/28/2021 ?PCP: [name], [name], MD  ? ?Chief Complaint  ?Patient presents with  ? Altered Mental Status  ? ?Level of care: Med-Surg ? ?Brief Admission History:  ?76 y.o. male with medical history significant for   Hypertension, depression, GERD, BPH, history of frequent falls,  prostate cancer on home hospice and chronic kidney disease stage IV who presents to the emergency department via EMS due to several day onset of altered mental status.  Patient was unable to provide history possibly due to altered mental status, history was obtained from ED physician and ED medical record.  Per report, patient was reported to have fallen out of wheelchair today and was suspected to have UTI due to stronger odor and color of urine.  At baseline, patient was usually alert and oriented x4.   ?  ?ED Course:  ?In the emergency department, he was hemodynamically stable, BP was 143/97 on arrival and other vital signs are within normal range.  Work-up in the ED showed normocytic anemia, BUN/creatinine 46/3.99 (baseline creatinine 2.1-2.6), hyponatremia lactic acid was normal, urinalysis was suspected to be indicative of UTI due to large leukocytes and many bacteria and WBC > 50 in the setting of altered mental status.  CT head without contrast showed chronic atrophic and ischemic changes without acute abnormality.  Chest x-ray showed no active disease.  Patient was started on IV ceftriaxone, IV hydration (1 L LR) was given.  Hospitalist was asked to admit patient for further evaluation and management. ?  ?06/29/2021:  pt remains very confused (likely his baseline).  He is being treated with IV fluid and with antibiotics for UTI.  ? ?06/30/2021: pt clinically worse more encephalopathic, appears uncomfortable.  Asked family to consider residential hospice as he is appropriate.   ? ?07/01/2021:  spoke with [facility] rep Jhonnie Garner informing me that family  decided to transition to full comfort care and they may have an inpatient hospice bed available in next 1-2 days.  Full comfort care orders initiated.  Pt a little less agitated today.  ?  ?Assessment and Plan: ?* UTI (urinary tract infection) ?--continue IV antibiotics as ordered.  ?--follow culture and sensitivity results  ?--continue supportive measures  ?--appears from culture to be a gram neg rod infection ?--Pt now transitioning to full comfort measures and transitioning to residential hospice when bed available.  ? ?Delirium ?--this has become severe and he has been medicated for this.   ?--Pt now transitioned to full comfort measures which is entirely appropriate given life expectancy <2 weeks.  See comfort care orders.  ? ?Recurrent falls ?--Pt remains on home hospice but has become more residential hospice appropriate.  I have requested [facility] to evaluate patient for residential hospice and asked for discussion with family.  ?--Pt transitioning to full comfort care 07/01/21     ? ?Acute kidney injury superimposed on stage IV chronic kidney disease  ?--Full comfort care ? ?Dehydration ?--He was treated with IV fluid and now transitioned to full comfort care ? ?Hyponatremia ?--likely has underlying SIADH from known malignancy ? ?Seizure ([place]) ?-- resume home meds.  Follow.  No recent seizure activity reported.  ? ?Prostate cancer [facility]) ?-- He is not full comfort care and awaiting residential hospice placement  ? ?COPD (chronic obstructive pulmonary disease) ([place]) ?--stable  ? ?GERD ?--protonix for GI protection ordered  ? ?OBESITY NOS ?--stable  ? ?Hyperlipidemia ?--resume home rosuvastatin  ? ?DVT prophylaxis: enoxaparin  ?  Code Status: DNR  ?Family Communication: t/c 3/29 ?Disposition: Status is: recommending residential hospice awaiting family decision  ?Remains inpatient appropriate because: IV fluid and IV antibiotics required  ?  ?Consultants:  ?PT  ?Procedures:  ?N/a ?Antimicrobials:  ?Ceftriaxone  3/29>>  ?Subjective: ?PT mostly nonverbal with severe dementia  ?Objective: ?Vitals:  ? 06/30/21 2052 06/30/21 2056 07/01/21 0420 07/01/21 1432  ?BP:  (!) 195/166 (!) 172/93 139/80  ?Pulse: 78 77 71 76  ?Resp:   18 18  ?Temp:   (!) 97.1 ?F (36.2 ?C) 97.9 ?F (36.6 ?C)  ?TempSrc:   Oral Oral  ?SpO2: 95% 93%  98%  ?Weight:      ?Height:      ? ? ?Intake/Output Summary (Last 24 hours) at 07/01/2021 1733 ?Last data filed at 07/01/2021 1200 ?Gross per 24 hour  ?Intake 240 ml  ?Output 1450 ml  ?Net -1210 ml  ? ?Filed Weights  ? 06/28/21 2329  ?Weight: 104 kg  ? ?Examination: ? ?General exam: severely confused with dementia and acute delirium, he remains mostly nonverbal. Appears uncomfortable and mentally distressed due to unfamiliar surroundings.   ?Respiratory system: Clear to auscultation. Respiratory effort normal. ?Cardiovascular system: normal S1 & S2 heard. No JVD, murmurs, rubs, gallops or clicks. No pedal edema. ?Gastrointestinal system: Abdomen is nondistended, soft and nontender. No organomegaly or masses felt. Normal bowel sounds heard. ?Central nervous system: Alert and oriented. No focal neurological deficits. ?Extremities: Symmetric 5 x 5 power. ?Skin: No rashes, lesions or ulcers. ?Psychiatry: Judgement and insight appear very poor.  Mood & affect appropriate.  ? ?Data Reviewed: I have personally reviewed following labs and imaging studies ? ?CBC: ?Recent Labs  ?Lab 06/28/21 ?2349 06/30/21 ?0503  ?WBC 8.3 8.4  ?NEUTROABS 6.8  --   ?HGB 10.2* 9.7*  ?HCT 31.5* 30.8*  ?MCV 93.8 96.6  ?PLT 212 187  ? ? ?Basic Metabolic Panel: ?Recent Labs  ?Lab 06/28/21 ?2349 06/29/21 ?0456 06/30/21 ?0503  ?NA 132*  --  137  ?K 5.1  --  4.4  ?CL 100  --  110  ?CO2 23  --  19*  ?GLUCOSE 113*  --  96  ?BUN 46*  --  40*  ?CREATININE 3.99*  --  3.23*  ?CALCIUM 8.5*  --  7.9*  ?MG  --  2.4  --   ?PHOS  --  4.9*  --   ? ? ?CBG: ?No results for input(s): GLUCAP in the last 168 hours. ? ?Recent Results (from the past 240 hour(s))  ?Blood  Culture (routine x 2)     Status: None (Preliminary result)  ? Collection Time: 06/28/21 11:49 PM  ? Specimen: BLOOD  ?Result Value Ref Range Status  ? Specimen Description BLOOD RIGHT ANTECUBITAL  Final  ? Special Requests   Final  ?  BOTTLES DRAWN AEROBIC AND ANAEROBIC Blood Culture adequate volume  ? Culture   Final  ?  NO GROWTH 3 DAYS ?Performed at [facility], [address] [place], [place] [address] ?  ? Report Status PENDING  Incomplete  ?Blood Culture (routine x 2)     Status: Abnormal  ? Collection Time: 06/28/21 11:49 PM  ? Specimen: BLOOD  ?Result Value Ref Range Status  ? Specimen Description   Final  ?  BLOOD LEFT ANTECUBITAL ?Performed at [facility], [address], [place], [place] [address] ?  ? Special Requests   Final  ?  BOTTLES DRAWN AEROBIC AND ANAEROBIC Blood Culture adequate volume ?Performed at [facility], [address], [place], [place] [address] ?  ? Culture  Setup Time   Final  ?  GRAM POSITIVE COCCI IN CLUSTERS BOTTLES DRAWN AEROBIC ONLY ?Gram Stain Report Called to,Read Back By and Verified With: Charlynn Court RN [phone] K [place] ?CRITICAL RESULT CALLED TO, READ BACK BY AND VERIFIED WITH: RN Ebony Hail [phone] '@2045'$  FH ?  ? Culture (A)  Final  ?  STAPHYLOCOCCUS SIMULANS ?THE SIGNIFICANCE OF ISOLATING THIS ORGANISM FROM A SINGLE SET OF BLOOD CULTURES WHEN MULTIPLE SETS ARE DRAWN IS UNCERTAIN. PLEASE NOTIFY THE MICROBIOLOGY DEPARTMENT WITHIN ONE WEEK IF SPECIATION AND SENSITIVITIES ARE REQUIRED. ?Performed at [facility], [place] [address], [place], [place] [address] ?  ? Report Status 07/01/2021 FINAL  Final  ?Blood Culture ID Panel (Reflexed)     Status: Abnormal  ? Collection Time: 06/28/21 11:49 PM  ?Result Value Ref Range Status  ? Enterococcus faecalis NOT DETECTED NOT DETECTED Final  ? Enterococcus Faecium NOT DETECTED NOT DETECTED Final  ? Listeria monocytogenes NOT DETECTED NOT DETECTED Final  ? Staphylococcus species DETECTED (A) NOT DETECTED Final  ?  Comment: CRITICAL  RESULT CALLED TO, READ BACK BY AND VERIFIED WITH: ?RN J. RHEW [id number] '@2045'$  FH ?  ? Staphylococcus aureus (BCID) NOT DETECTED NOT DETECTED Final  ? Staphylococcus epidermidis NOT DETECTED NOT DETECTED Final

## 2021-07-01 NOTE — TOC Progression Note (Signed)
Transition of Care (TOC) - Progression Note  ? ? ?Patient Details  ?Name: JACQUAN SAVAS ?MRN: [id number] ?Date of Birth: 1945-05-03 ? ?Transition of Care (TOC) CM/SW Contact  ?Khylin Gutridge D, LCSW ?Phone Number: ?07/01/2021, 12:19 PM ? ?Clinical Narrative:    ?Patient is comfort care. [facility] may have inpatient hospice bed over weekend.  ? ? ?Expected Discharge Plan: [place] ?Barriers to Discharge: Continued Medical Work up ? ?Expected Discharge Plan and Services ?Expected Discharge Plan: [place] ?  ?  ?  ?Living arrangements for the past 2 months: [place] ?                ?  ?  ?  ?  ?  ?  ?  ?  ?  ?  ? ? ?Social Determinants of Health (SDOH) Interventions ?  ? ?Readmission Risk Interventions ? ?  02/25/2021  ?  1:22 PM  ?Readmission Risk Prevention Plan  ?Transportation Screening Complete  ?Medication Review [employer]) Complete  ?PCP or Specialist appointment within 3-5 days of discharge Complete  ?[place] or Home Care Consult Complete  ?SW Recovery Care/Counseling Consult Complete  ?Palliative Care Screening Not Applicable  ?[place] Not Applicable  ? ? ?

## 2021-07-01 NOTE — Progress Notes (Signed)
Lab called Patient [name]. MD notified, no new orders at this time. ?

## 2021-07-01 NOTE — Progress Notes (Signed)
[facility] [address] [facility]) Hospitalized Hospice Patient ?  ?[name] is a current [facility] hospice patient with a terminal diagnosis of traumatic subarachnoid hemmorhage. [name] lives at home with his wife and for the last couple of days has been experiencing worsening AMS. Patient subsequently fell out of his wheelchair evening of 3.28, EMS was called and he was transported to the hospital. Patient is admitted as of 3.29.23 with a diagnosis of UTI. Per Dr. Alferd Patee with [facility] this is a related admission.  ?  ?Visited at bedside and exchanged report with bedside nurse, TOC and MD. Patient is sitting up in bed he is awake and alert but when attempting to talk speech seems garbled and un-intelligible.  ? ?Patient requires inpatient status currently for treatment of UTI, AMS and need for increased monitoring.  ?  ?V/S: 97.9/99/18   139/80          spO2 91% room air ?I&O: 240/1450 ?Diagnostics:  ?None new ?Labs: None new ? ?IV/PRN: Rocephin 1 gram every 24 hours ?  ?Problem List: ?UTI (urinary tract infection) ?--continue IV antibiotics as ordered.  ?--follow culture and sensitivity results  ?--continue supportive measures  ?  ?Recurrent falls ?--Pt remains on home hospice.   Will ask for PT evaluation.  Fall precautions advised.   ?  ?Acute kidney injury superimposed on chronic kidney disease ([place]) ?--check renal function in AM  ?  ?Dehydration ?--IV fluid ordered for resuscitation ?  ?  ?GOC: Per wife and family to keep him comfortable ?D/C planning: ongoing, Patient approved for transfer to [facility] once bed available ?Family: talked on phone with his wife [name]  ?IDT: Hospice team updated ? ?Please don't hesitate to call for any hospice related questions or concerns.  ?Jhonnie Garner, BSN, Therapist, sports, [employer] ?Hospice hospital liaison ?[phone] ?

## 2021-07-01 NOTE — Progress Notes (Addendum)
Patient blood pressure 195/166. Patient acknowldged when asked him if he would take his Carvedilol medicine that would help his blood pressure. Crushed medication and given in vanilla pudding. Followed medication with water with not complications. Asked patient is I could do his oral care to help his mouth that is dry he acknowledged and allowed oral care to be done.  ?

## 2021-07-01 NOTE — Assessment & Plan Note (Signed)
--  continuing DNR order in hospital ?

## 2021-07-01 NOTE — Progress Notes (Signed)
Occupational Therapy Treatment ?Patient Details ?Name: [name] ?MRN: [id number] ?DOB: Aug 19, 1945 ?Today's Date: 07/01/2021 ? ? ?History of present illness KELLAN RAFFIELD is a 76 y.o. male with medical history significant for   Hypertension, depression, GERD, BPH, history of frequent falls,  prostate cancer on hospice and chronic kidney disease stage IV who presents to the emergency department via EMS due to several day onset of altered mental status.  Patient was unable to provide history possibly due to altered mental status, history was obtained from ED physician and ED medical record.  Per report, patient was reported to have fallen out of wheelchair today and was suspected to have UTI due to stronger odor and color of urine.  At baseline, patient was usually alert and oriented x4. ?  ?OT comments ? Pt limited to bed mobility this session due to increased fatigue following clean up in bed and waxing/waning cognition. This session, pt continually reported that he was not a patient and that he worked here. He was soiled on OT entry and required max-total assist for bathing and min A to doff/don hospital gown. Once clean up, pt immediately started to fall asleep, deferring more mobility at this time. OT will continue to follow acutely.   ? ?Recommendations for follow up therapy are one component of a multi-disciplinary discharge planning process, led by the attending physician.  Recommendations may be updated based on patient status, additional functional criteria and insurance authorization. ?   ?Follow Up Recommendations ? Skilled nursing-short term rehab (<3 hours/day)  ?  ?Assistance Recommended at Discharge Frequent or constant Supervision/Assistance  ?Patient can return home with the following ? A lot of help with walking and/or transfers;A lot of help with bathing/dressing/bathroom;Assistance with cooking/housework;Direct supervision/assist for medications management;Assist for transportation;Help  with stairs or ramp for entrance ?  ?Equipment Recommendations ? None recommended by OT  ?  ?Recommendations for Other Services   ? ?  ?Precautions / Restrictions Precautions ?Precautions: Fall ?Restrictions ?Weight Bearing Restrictions: No  ? ? ?  ? ?Mobility Bed Mobility ?Overal bed mobility: Needs Assistance ?Bed Mobility: Rolling ?Rolling: Mod assist ?  ?  ?  ?  ?General bed mobility comments: Pt able to initiate roll to both sides, however needs mod A to bring his hips over and maintain sidelying.  Can pull to long sit with mod A ?  ? ?Transfers ?  ?  ?  ?  ?  ?  ?  ?  ?  ?General transfer comment: Deferred due to fatigue following clean up in bed. ?  ?  ?Balance   ?  ?  ?  ?  ?  ?  ?  ?  ?  ?  ?  ?  ?  ?  ?  ?  ?  ?  ?   ? ?ADL either performed or assessed with clinical judgement  ? ?ADL Overall ADL's : Needs assistance/impaired ?  ?  ?Grooming: Sitting;Minimal assistance ?Grooming Details (indicate cue type and reason): completed sitting in bed, needing hand over hand assist. ?Upper Body Bathing: Moderate assistance;Sitting ?Upper Body Bathing Details (indicate cue type and reason): completed sitting in bed ?Lower Body Bathing: Total assistance;+2 for safety/equipment;Bed level ?Lower Body Bathing Details (indicate cue type and reason): Pt needed max A to assist with rolling and with verbal cuing he was unable to complete LB bathing. ?Upper Body Dressing : Minimal assistance;Sitting ?Upper Body Dressing Details (indicate cue type and reason): donned hospital gown sitting in bed, min  A with verbal cuing for sequencing ?  ?  ?  ?  ?  ?  ?  ?  ?  ?General ADL Comments: Session remained bed level due to pt weakness/fatigue after clean up in bed. ?  ? ?Extremity/Trunk Assessment   ?  ?  ?  ?  ?  ? ?Vision   ?  ?  ?Perception   ?  ?Praxis   ?  ? ?Cognition Arousal/Alertness: Awake/alert ?Behavior During Therapy: Restless ?Overall Cognitive Status: No family/caregiver present to determine baseline cognitive  functioning ?  ?  ?  ?  ?  ?  ?  ?  ?  ?  ?  ?  ?  ?  ?  ?  ?General Comments: Pt continuing to report that he is not a patient here, he works here. Pt not aware that he was soiled this session. Reporting that he was perfectly capable to getting up and walking out of here. ?  ?  ?   ?Exercises   ? ?  ?Shoulder Instructions   ? ? ?  ?General Comments VSS on RA, NT assisted with clean up and bed mobility  ? ? ?Pertinent Vitals/ Pain       Pain Assessment ?Pain Assessment: No/denies pain ? ?Home Living   ?  ?  ?  ?  ?  ?  ?  ?  ?  ?  ?  ?  ?  ?  ?  ?  ?  ?  ? ?  ?Prior Functioning/Environment    ?  ?  ?  ?   ? ?Frequency ? Min 2X/week  ? ? ? ? ?  ?Progress Toward Goals ? ?OT Goals(current goals can now be found in the care plan section) ? Progress towards OT goals: Not progressing toward goals - comment (Pt mentation waxing and waning) ? ?Acute Rehab OT Goals ?Patient Stated Goal: To go home ?OT Goal Formulation: With patient ?Time For Goal Achievement: 07/13/21 ?Potential to Achieve Goals: Fair ?ADL Goals ?Pt Will Perform Grooming: sitting;with modified independence ?Pt Will Perform Upper Body Dressing: with modified independence;sitting ?Pt Will Perform Lower Body Dressing: with min assist;sitting/lateral leans;with adaptive equipment ?Pt Will Transfer to Toilet: with min assist;with mod assist;stand pivot transfer ?Pt Will Perform Toileting - Clothing Manipulation and hygiene: with min guard assist;sitting/lateral leans ?Pt/caregiver will Perform Home Exercise Program: Increased ROM;Increased strength;Right Upper extremity;Left upper extremity;With Supervision  ?Plan Discharge plan remains appropriate;Frequency remains appropriate   ? ?Co-evaluation ? ? ?   ?  ?  ?  ?  ? ?  ?AM-PAC OT "6 Clicks" Daily Activity     ?Outcome Measure ? ? Help from another person eating meals?: A Lot ?Help from another person taking care of personal grooming?: A Little ?Help from another person toileting, which includes using toliet,  bedpan, or urinal?: A Lot ?Help from another person bathing (including washing, rinsing, drying)?: A Lot ?Help from another person to put on and taking off regular upper body clothing?: A Little ?Help from another person to put on and taking off regular lower body clothing?: A Lot ?6 Click Score: 14 ? ?  ?End of Session   ? ?OT Visit Diagnosis: Unsteadiness on feet (R26.81);Other abnormalities of gait and mobility (R26.89);Muscle weakness (generalized) (M62.81);History of falling (Z91.81);Other symptoms and signs involving cognitive function ?  ?Activity Tolerance Patient limited by fatigue ?  ?Patient Left in bed;with call bell/phone within reach;with bed alarm set ?  ?Nurse Communication Mobility status ?  ? ?   ? ?  Time: 1225-8346 ?OT Time Calculation (min): 23 min ? ?Charges: OT Treatments ?$Self Care/Home Management : 23-37 mins ? ?Xayvier Vallez H., OTR/L ?Acute Rehabilitation ? ?Lorrie Strauch Elane Yolanda Bonine ?07/01/2021, 1:17 PM ?

## 2021-07-02 DIAGNOSIS — R410 Disorientation, unspecified: Secondary | ICD-10-CM | POA: Diagnosis not present

## 2021-07-02 DIAGNOSIS — Z66 Do not resuscitate: Secondary | ICD-10-CM

## 2021-07-02 DIAGNOSIS — N390 Urinary tract infection, site not specified: Secondary | ICD-10-CM | POA: Diagnosis not present

## 2021-07-02 DIAGNOSIS — J449 Chronic obstructive pulmonary disease, unspecified: Secondary | ICD-10-CM | POA: Diagnosis not present

## 2021-07-02 DIAGNOSIS — N179 Acute kidney failure, unspecified: Secondary | ICD-10-CM | POA: Diagnosis not present

## 2021-07-02 LAB — CULTURE, BLOOD (ROUTINE X 2): Special Requests: ADEQUATE

## 2021-07-02 NOTE — Progress Notes (Addendum)
2:40pm: ?CSW was notified by Rojelio Brenner of [facility] who states [facility] is en route to transport patient. ? ?2:10pm: ?CSW received return call from [place] who states the agency cannot transport the patient until tomorrow at the earliest. ? ?CSW spoke with supervisor at [facility] who states he cannot transport the patient. ? ?1:10pm: ?CSW was notified by [facility] that [facility] can accept the patient into the hospice house today. ? ?The facility address is [address] [place], [place]. The number to call for report is [phone].  ? ?RN and MD aware of plan. ? ?CSW scheduled transport for first available pickup. ? ?10:45am: ?CSW spoke with [place] who states she is aware of the patient and is en route to visit him at the hospital. ? ?Madilyn Fireman, MSW, LCSW ?Transitions of Care  Clinical Social Worker II ?[phone] ? ?

## 2021-07-02 NOTE — Progress Notes (Signed)
Pt asleep, restless, moaning. Attempted to wake pt without success. Repositioned pt in bed, raised HOB and performed oral care. Pt is a mouth breather and tongue/ oral mucosa pink, dry with dried mucous noted. Mouth cleaned, pt more awake, able to suck water off of swab. Pt then stated, "Give me some water." Pt unable to suck on straw but able to swallow spoonful of water without difficulty. Face washed, mouth moisturizer applied. Pt now awake, states ready to eat breakfast. Able to states name and DOB, but cannot tell me location or situation.  ?Pt fed by NT, able to take few spoonfuls of egg and oatmeal, swallowed without difficulty. Drank whole cup of juice and few sips of milk. Pt states, "I'm done." Pt now sleeping soundly at this time. ?

## 2021-07-02 NOTE — Progress Notes (Signed)
Pt discharged via stretcher by [facility] transport services to [facility]. Left AC IV site left intact per hospice request and MD Johnson's OK. ?

## 2021-07-02 NOTE — Discharge Instructions (Signed)
Symptom management per hospice protocol  

## 2021-07-02 NOTE — Progress Notes (Signed)
Pt has home medications secured in pharmacy but pharmacy closed for the day and staff unable to retrieve meds for pt discharged to [facility].  ?Wife and grandson notified via phone of meds still in pharmacy here in [place] and need to pick up either tomorrow (Sunday 07/03/2021) or Monday (07/04/2021) between the hours of 8a and 4p, and state understanding. ?

## 2021-07-02 NOTE — Discharge Summary (Signed)
Physician Discharge Summary  ?[name] QBH:[id number] DOB: 1945-07-21 DOA: 06/28/2021 ? ?PCP: [name], [name], MD ? ?Admit date: 06/28/2021 ?Discharge date: 07/02/2021 ? ?Disposition:  RESIDENTIAL HOSPICE  ? ?Recommendations for Outpatient Follow-up:  ?SYMPTOM MANAGEMENT PER HOSPICE PROTOCOL ? ?Discharge Condition: HOSPICE   ?CODE STATUS: DNR  ?DIET:  HOSPICE  ? ?Brief Hospitalization Summary: ?Please see all hospital notes, images, labs for full details of the hospitalization. ?76 y.o. male with medical history significant for   Hypertension, depression, GERD, BPH, history of frequent falls,  prostate cancer on home hospice and chronic kidney disease stage IV who presents to the emergency department via EMS due to several day onset of altered mental status.  Patient was unable to provide history possibly due to altered mental status, history was obtained from ED physician and ED medical record.  Per report, patient was reported to have fallen out of wheelchair today and was suspected to have UTI due to stronger odor and color of urine.  At baseline, patient was usually alert and oriented x4.   ?  ?ED Course:  ?In the emergency department, he was hemodynamically stable, BP was 143/97 on arrival and other vital signs are within normal range.  Work-up in the ED showed normocytic anemia, BUN/creatinine 46/3.99 (baseline creatinine 2.1-2.6), hyponatremia lactic acid was normal, urinalysis was suspected to be indicative of UTI due to large leukocytes and many bacteria and WBC > 50 in the setting of altered mental status.  CT head without contrast showed chronic atrophic and ischemic changes without acute abnormality.  Chest x-ray showed no active disease.  Patient was started on IV ceftriaxone, IV hydration (1 L LR) was given.  Hospitalist was asked to admit patient for further evaluation and management. ?  ?06/29/2021:  pt remains very confused (likely his baseline).  He is being treated with IV fluid and with  antibiotics for UTI.  ? ?06/30/2021: pt clinically worse more encephalopathic, appears uncomfortable.  Asked family to consider residential hospice as he is appropriate.   ? ?07/01/2021:  spoke with [facility] rep Jhonnie Garner informing me that family decided to transition to full comfort care and they may have an inpatient hospice bed available in next 1-2 days.  Full comfort care orders initiated.  Pt a little less agitated today.  ? ?07/02/2021: Pt accepted for inpatient bed today.  DC to residential hospice.  SYMPTOM MANAGEMENT PER HOSPICE PROTOCOL  ? ?Assessment and Plan: ?* UTI (urinary tract infection) ?--continue IV antibiotics as ordered.  ?--follow culture and sensitivity results  ?--continue supportive measures  ?--appears from culture to be a gram neg rod infection ?--Pt now transitioning to full comfort measures and transitioning to residential hospice when bed available.  ? ?DNR (do not resuscitate) ?--continuing DNR order in hospital ? ?Delirium ?--this has become severe and he has been medicated for this.   ?--Pt now transitioned to full comfort measures which is entirely appropriate given life expectancy <2 weeks.  See comfort care orders.  ? ?Recurrent falls ?--Pt remains on home hospice but has become more residential hospice appropriate.  I have requested [facility] to evaluate patient for residential hospice and asked for discussion with family.  ?--Pt transitioning to full comfort care 07/01/21     ? ?Acute kidney injury superimposed on stage IV chronic kidney disease  ?--Full comfort care ? ?Dehydration ?--He was treated with IV fluid and now transitioned to full comfort care ? ?Hyponatremia ?--likely has underlying SIADH from known malignancy ? ?Seizure ([place]) ?-- resume  home meds.  Follow.  No recent seizure activity reported.  ? ?Prostate cancer [facility]) ?-- He is not full comfort care and awaiting residential hospice placement  ? ?COPD (chronic obstructive pulmonary disease) ([place]) ?--stable   ? ?GERD ?--protonix for GI protection ordered  ? ?OBESITY NOS ?--stable  ? ?Hyperlipidemia ?--resume home rosuvastatin  ? ?Discharge Diagnoses:  ?Principal Problem: ?  UTI (urinary tract infection) ?Active Problems: ?  Hyperlipidemia ?  OBESITY NOS ?  Essential hypertension, benign ?  GERD ?  COPD (chronic obstructive pulmonary disease) ([place]) ?  Prostate cancer ([place]) ?  Seizure ([place]) ?  Hyponatremia ?  Dehydration ?  Acute kidney injury superimposed on stage IV chronic kidney disease  ?  Recurrent falls ?  Delirium ?  DNR (do not resuscitate) ? ? ?Discharge Instructions: ? ?Allergies as of 07/02/2021   ? ?   Reactions  ? Carbidopa-levodopa Other (See Comments)  ? hallunications  ? Morphine Sulfate Nausea And Vomiting  ? Sulfa Antibiotics Nausea And Vomiting  ? Sulfacetamide Sodium Nausea And Vomiting  ? ?  ? ?  ?Medication List  ?  ? ?STOP taking these medications   ? ?acetaminophen 325 MG tablet ?Commonly known as: TYLENOL ?  ?albuterol (2.5 MG/3ML) 0.083% nebulizer solution ?Commonly known as: PROVENTIL ?  ?albuterol 108 (90 Base) MCG/ACT inhaler ?Commonly known as: VENTOLIN HFA ?  ?benzonatate 200 MG capsule ?Commonly known as: TESSALON ?  ?carvedilol 25 MG tablet ?Commonly known as: COREG ?  ?DULoxetine 60 MG capsule ?Commonly known as: CYMBALTA ?  ?ibuprofen 200 MG tablet ?Commonly known as: ADVIL ?  ?levETIRAcetam 500 MG tablet ?Commonly known as: KEPPRA ?  ?meclizine 25 MG tablet ?Commonly known as: ANTIVERT ?  ?pantoprazole 40 MG tablet ?Commonly known as: PROTONIX ?  ?rosuvastatin 20 MG tablet ?Commonly known as: CRESTOR ?  ?solifenacin 5 MG tablet ?Commonly known as: VESICARE ?  ?tamsulosin 0.4 MG Caps capsule ?Commonly known as: FLOMAX ?  ?traMADol 50 MG tablet ?Commonly known as: ULTRAM ?  ? ?  ? ? ?Allergies  ?Allergen Reactions  ? Carbidopa-Levodopa Other (See Comments)  ?  hallunications  ? Morphine Sulfate Nausea And Vomiting  ? Sulfa Antibiotics Nausea And Vomiting  ? Sulfacetamide Sodium Nausea And  Vomiting  ? ?Allergies as of 07/02/2021   ? ?   Reactions  ? Carbidopa-levodopa Other (See Comments)  ? hallunications  ? Morphine Sulfate Nausea And Vomiting  ? Sulfa Antibiotics Nausea And Vomiting  ? Sulfacetamide Sodium Nausea And Vomiting  ? ?  ? ?  ?Medication List  ?  ? ?STOP taking these medications   ? ?acetaminophen 325 MG tablet ?Commonly known as: TYLENOL ?  ?albuterol (2.5 MG/3ML) 0.083% nebulizer solution ?Commonly known as: PROVENTIL ?  ?albuterol 108 (90 Base) MCG/ACT inhaler ?Commonly known as: VENTOLIN HFA ?  ?benzonatate 200 MG capsule ?Commonly known as: TESSALON ?  ?carvedilol 25 MG tablet ?Commonly known as: COREG ?  ?DULoxetine 60 MG capsule ?Commonly known as: CYMBALTA ?  ?ibuprofen 200 MG tablet ?Commonly known as: ADVIL ?  ?levETIRAcetam 500 MG tablet ?Commonly known as: KEPPRA ?  ?meclizine 25 MG tablet ?Commonly known as: ANTIVERT ?  ?pantoprazole 40 MG tablet ?Commonly known as: PROTONIX ?  ?rosuvastatin 20 MG tablet ?Commonly known as: CRESTOR ?  ?solifenacin 5 MG tablet ?Commonly known as: VESICARE ?  ?tamsulosin 0.4 MG Caps capsule ?Commonly known as: FLOMAX ?  ?traMADol 50 MG tablet ?Commonly known as: ULTRAM ?  ? ?  ? ? ?  Procedures/Studies: ?CT Head Wo Contrast ? ?Result Date: 06/29/2021 ?CLINICAL DATA:  Altered mental status for few days, initial encounter EXAM: CT HEAD WITHOUT CONTRAST TECHNIQUE: Contiguous axial images were obtained from the base of the skull through the vertex without intravenous contrast. RADIATION DOSE REDUCTION: This exam was performed according to the departmental dose-optimization program which includes automated exposure control, adjustment of the mA and/or kV according to patient size and/or use of iterative reconstruction technique. COMPARISON:  03/03/2021 FINDINGS: Brain: No evidence of acute infarction, hemorrhage, hydrocephalus, extra-axial collection or mass lesion/mass effect. Atrophic and ischemic changes are noted. Vascular: No hyperdense vessel or  unexpected calcification. Skull: Normal. Negative for fracture or focal lesion. Sinuses/Orbits: No acute finding. Other: None. IMPRESSION: Chronic atrophic and ischemic changes without acute abnormality. Electr

## 2021-07-03 LAB — CULTURE, BLOOD (ROUTINE X 2)
Culture: NO GROWTH
Special Requests: ADEQUATE

## 2021-07-05 LAB — CULTURE, BLOOD (ROUTINE X 2): Culture: NO GROWTH

## 2021-07-07 ENCOUNTER — Telehealth: Payer: Self-pay

## 2021-07-07 NOTE — Chronic Care Management (AMB) (Signed)
?  Care Management  ? ?Note ? ?07/07/2021 ?Name: BELFORD PASCUCCI MRN: [id number] DOB: [birth year] ? ?[name] is a 76 y.o. year old male who is a primary care patient of [name], [name], MD and is actively engaged with the care management team. I reached out to [name] by phone today to assist with re-scheduling a follow up visit with the RN Case Manager ? ?Follow up plan: ?Unsuccessful telephone outreach attempt made. A HIPAA compliant phone message was left for the patient providing contact information and requesting a return call.  ?The care management team will reach out to the patient again over the next 7 days.  ?If patient returns call to provider office, please advise to call [place]  at [phone] ? ?Noreene Larsson, RMA ?Care Guide, Embedded Care Coordination ?[place]  Care Management  ?[place], [place] [address] ?Direct Dial: [phone] ?[email or web address] ?Website: [email or web address]  ? ?

## 2021-07-18 DIAGNOSIS — Z20822 Contact with and (suspected) exposure to covid-19: Secondary | ICD-10-CM | POA: Diagnosis not present

## 2021-07-21 ENCOUNTER — Telehealth: Payer: Self-pay | Admitting: Family Medicine

## 2021-07-21 NOTE — Telephone Encounter (Signed)
Okay thanks for the information 

## 2021-08-01 DEATH — deceased

## 2021-08-08 DIAGNOSIS — Z20822 Contact with and (suspected) exposure to covid-19: Secondary | ICD-10-CM | POA: Diagnosis not present

## 2021-10-11 ENCOUNTER — Ambulatory Visit: Payer: Self-pay | Admitting: *Deleted

## 2021-10-11 NOTE — Chronic Care Management (AMB) (Signed)
  Chronic Care Management   Note  10/11/2021 Name: [name] MRN: [id number] DOB: [birth year]   Patient has not recently engaged with the Chronic Care Management RN Care Manager. Removing RN Care Manager from Care Team and closing [place]. If patient is currently engaged with another CCM team member I will forward this encounter to inform them of my case closure. Patient may be eligible for re-engagement with RN Care Manager in the future if necessary and can discuss this with their PCP.  Chong Sicilian, BSN, RN-BC Embedded Chronic Care Manager [facility] Direct Dial: [phone]

## 2021-11-30 ENCOUNTER — Ambulatory Visit: Payer: Medicare Other
